# Patient Record
Sex: Female | Born: 1935
Health system: Southern US, Community
[De-identification: ages and names within clinical notes are randomized; demographics above are authoritative.]

## PROBLEM LIST (undated history)

## (undated) DIAGNOSIS — E785 Hyperlipidemia, unspecified: Secondary | ICD-10-CM

## (undated) DIAGNOSIS — R600 Localized edema: Secondary | ICD-10-CM

## (undated) DIAGNOSIS — I255 Ischemic cardiomyopathy: Secondary | ICD-10-CM

## (undated) DIAGNOSIS — I447 Left bundle-branch block, unspecified: Secondary | ICD-10-CM

## (undated) DIAGNOSIS — I10 Essential (primary) hypertension: Secondary | ICD-10-CM

## (undated) DIAGNOSIS — I4891 Unspecified atrial fibrillation: Secondary | ICD-10-CM

## (undated) DIAGNOSIS — H3530 Unspecified macular degeneration: Secondary | ICD-10-CM

## (undated) DIAGNOSIS — K7689 Other specified diseases of liver: Secondary | ICD-10-CM

## (undated) DIAGNOSIS — R42 Dizziness and giddiness: Secondary | ICD-10-CM

## (undated) DIAGNOSIS — R609 Edema, unspecified: Secondary | ICD-10-CM

## (undated) DIAGNOSIS — Z9889 Other specified postprocedural states: Secondary | ICD-10-CM

## (undated) DIAGNOSIS — K219 Gastro-esophageal reflux disease without esophagitis: Secondary | ICD-10-CM

## (undated) DIAGNOSIS — K5900 Constipation, unspecified: Secondary | ICD-10-CM

## (undated) HISTORY — DX: Left bundle-branch block, unspecified: I44.7

## (undated) HISTORY — PX: TONSILLECTOMY: SUR1361

## (undated) HISTORY — DX: Hyperlipidemia, unspecified: E78.5

## (undated) HISTORY — DX: Dizziness and giddiness: R42

## (undated) HISTORY — PX: CHOLECYSTECTOMY: SHX55

## (undated) HISTORY — PX: WRIST SURGERY: SHX841

## (undated) HISTORY — DX: Other specified diseases of liver: K76.89

## (undated) HISTORY — PX: OTHER SURGICAL HISTORY: SHX169

## (undated) HISTORY — DX: Other specified postprocedural states: Z98.89

## (undated) HISTORY — DX: Edema, unspecified: R60.9

## (undated) HISTORY — DX: Localized edema: R60.0

## (undated) HISTORY — DX: Unspecified atrial fibrillation: I48.91

## (undated) HISTORY — DX: Ischemic cardiomyopathy: I25.5

## (undated) HISTORY — DX: Essential (primary) hypertension: I10

## (undated) HISTORY — DX: Gastro-esophageal reflux disease without esophagitis: K21.9

## (undated) HISTORY — PX: ANKLE SURGERY: SHX546

---

## 1998-10-03 ENCOUNTER — Emergency Department (HOSPITAL_COMMUNITY): Admission: EM | Admit: 1998-10-03 | Discharge: 1998-10-03 | Payer: Self-pay | Admitting: Emergency Medicine

## 1999-01-29 ENCOUNTER — Other Ambulatory Visit: Admission: RE | Admit: 1999-01-29 | Discharge: 1999-01-29 | Payer: Self-pay | Admitting: *Deleted

## 1999-11-02 ENCOUNTER — Encounter: Admission: RE | Admit: 1999-11-02 | Discharge: 1999-11-02 | Payer: Self-pay | Admitting: Family Medicine

## 1999-11-02 ENCOUNTER — Encounter: Payer: Self-pay | Admitting: Family Medicine

## 2000-01-31 ENCOUNTER — Other Ambulatory Visit: Admission: RE | Admit: 2000-01-31 | Discharge: 2000-01-31 | Payer: Self-pay | Admitting: *Deleted

## 2001-06-05 ENCOUNTER — Ambulatory Visit (HOSPITAL_COMMUNITY): Admission: RE | Admit: 2001-06-05 | Discharge: 2001-06-05 | Payer: Self-pay | Admitting: Cardiovascular Disease

## 2001-10-13 ENCOUNTER — Encounter (INDEPENDENT_AMBULATORY_CARE_PROVIDER_SITE_OTHER): Payer: Self-pay | Admitting: Specialist

## 2001-10-13 ENCOUNTER — Ambulatory Visit (HOSPITAL_COMMUNITY): Admission: RE | Admit: 2001-10-13 | Discharge: 2001-10-13 | Payer: Self-pay | Admitting: Gastroenterology

## 2002-04-13 ENCOUNTER — Encounter: Payer: Self-pay | Admitting: Family Medicine

## 2002-04-13 ENCOUNTER — Encounter: Admission: RE | Admit: 2002-04-13 | Discharge: 2002-04-13 | Payer: Self-pay | Admitting: Family Medicine

## 2003-02-10 ENCOUNTER — Inpatient Hospital Stay (HOSPITAL_COMMUNITY): Admission: AD | Admit: 2003-02-10 | Discharge: 2003-02-13 | Payer: Self-pay | Admitting: Family Medicine

## 2003-02-10 ENCOUNTER — Encounter: Admission: RE | Admit: 2003-02-10 | Discharge: 2003-02-10 | Payer: Self-pay | Admitting: Family Medicine

## 2003-02-10 ENCOUNTER — Encounter: Payer: Self-pay | Admitting: Family Medicine

## 2003-02-11 ENCOUNTER — Encounter: Payer: Self-pay | Admitting: Internal Medicine

## 2003-02-11 ENCOUNTER — Encounter: Payer: Self-pay | Admitting: Cardiology

## 2003-12-02 ENCOUNTER — Encounter: Admission: RE | Admit: 2003-12-02 | Discharge: 2003-12-02 | Payer: Self-pay | Admitting: General Surgery

## 2004-09-13 ENCOUNTER — Encounter: Admission: RE | Admit: 2004-09-13 | Discharge: 2004-09-13 | Payer: Self-pay | Admitting: Family Medicine

## 2004-10-30 ENCOUNTER — Ambulatory Visit (HOSPITAL_COMMUNITY): Admission: RE | Admit: 2004-10-30 | Discharge: 2004-10-30 | Payer: Self-pay | Admitting: Gastroenterology

## 2005-03-19 ENCOUNTER — Ambulatory Visit: Payer: Self-pay | Admitting: Cardiovascular Disease

## 2005-04-18 ENCOUNTER — Ambulatory Visit: Payer: Self-pay | Admitting: Cardiovascular Disease

## 2005-04-18 ENCOUNTER — Ambulatory Visit: Payer: Self-pay

## 2005-04-22 ENCOUNTER — Ambulatory Visit: Payer: Self-pay | Admitting: Cardiology

## 2005-05-15 ENCOUNTER — Ambulatory Visit: Payer: Self-pay | Admitting: Cardiovascular Disease

## 2005-07-14 ENCOUNTER — Emergency Department (HOSPITAL_COMMUNITY): Admission: EM | Admit: 2005-07-14 | Discharge: 2005-07-14 | Payer: Self-pay | Admitting: Family Medicine

## 2005-07-29 ENCOUNTER — Ambulatory Visit: Payer: Self-pay | Admitting: Cardiovascular Disease

## 2006-01-28 ENCOUNTER — Ambulatory Visit: Payer: Self-pay | Admitting: Cardiovascular Disease

## 2006-01-28 ENCOUNTER — Ambulatory Visit: Payer: Self-pay | Admitting: *Deleted

## 2006-02-06 ENCOUNTER — Encounter (INDEPENDENT_AMBULATORY_CARE_PROVIDER_SITE_OTHER): Payer: Self-pay | Admitting: *Deleted

## 2006-02-06 ENCOUNTER — Ambulatory Visit (HOSPITAL_BASED_OUTPATIENT_CLINIC_OR_DEPARTMENT_OTHER): Admission: RE | Admit: 2006-02-06 | Discharge: 2006-02-06 | Payer: Self-pay | Admitting: General Surgery

## 2006-06-16 ENCOUNTER — Ambulatory Visit (HOSPITAL_COMMUNITY): Admission: RE | Admit: 2006-06-16 | Discharge: 2006-06-16 | Payer: Self-pay | Admitting: Ophthalmology

## 2006-07-18 ENCOUNTER — Ambulatory Visit: Payer: Self-pay | Admitting: Cardiovascular Disease

## 2006-07-31 ENCOUNTER — Encounter: Payer: Self-pay | Admitting: Cardiology

## 2006-07-31 ENCOUNTER — Ambulatory Visit: Payer: Self-pay

## 2006-08-11 ENCOUNTER — Ambulatory Visit: Payer: Self-pay | Admitting: Cardiovascular Disease

## 2006-09-09 ENCOUNTER — Emergency Department (HOSPITAL_COMMUNITY): Admission: EM | Admit: 2006-09-09 | Discharge: 2006-09-09 | Payer: Self-pay | Admitting: Emergency Medicine

## 2006-09-11 ENCOUNTER — Ambulatory Visit: Payer: Self-pay | Admitting: Cardiovascular Disease

## 2006-11-03 ENCOUNTER — Encounter: Admission: RE | Admit: 2006-11-03 | Discharge: 2006-11-03 | Payer: Self-pay | Admitting: Family Medicine

## 2007-02-18 ENCOUNTER — Ambulatory Visit: Payer: Self-pay | Admitting: Cardiovascular Disease

## 2007-05-06 ENCOUNTER — Ambulatory Visit: Payer: Self-pay | Admitting: Cardiovascular Disease

## 2007-05-06 LAB — CONVERTED CEMR LAB
Chloride: 108 meq/L (ref 96–112)
GFR calc non Af Amer: 64 mL/min
Potassium: 4.2 meq/L (ref 3.5–5.1)
Pro B Natriuretic peptide (BNP): 148 pg/mL — ABNORMAL HIGH (ref 0.0–100.0)
Sodium: 145 meq/L (ref 135–145)

## 2007-08-04 ENCOUNTER — Ambulatory Visit: Payer: Self-pay | Admitting: Cardiology

## 2007-08-04 ENCOUNTER — Observation Stay (HOSPITAL_COMMUNITY): Admission: EM | Admit: 2007-08-04 | Discharge: 2007-08-07 | Payer: Self-pay | Admitting: Emergency Medicine

## 2007-09-02 ENCOUNTER — Ambulatory Visit: Payer: Self-pay | Admitting: Cardiovascular Disease

## 2007-10-26 ENCOUNTER — Ambulatory Visit: Payer: Self-pay | Admitting: Cardiovascular Disease

## 2008-04-21 ENCOUNTER — Encounter: Admission: RE | Admit: 2008-04-21 | Discharge: 2008-04-21 | Payer: Self-pay | Admitting: Family Medicine

## 2008-04-25 ENCOUNTER — Ambulatory Visit: Payer: Self-pay | Admitting: Cardiovascular Disease

## 2008-06-07 ENCOUNTER — Emergency Department (HOSPITAL_COMMUNITY): Admission: EM | Admit: 2008-06-07 | Discharge: 2008-06-07 | Payer: Self-pay | Admitting: Emergency Medicine

## 2008-07-26 ENCOUNTER — Ambulatory Visit: Payer: Self-pay

## 2008-07-26 ENCOUNTER — Encounter: Payer: Self-pay | Admitting: Cardiovascular Disease

## 2008-07-28 ENCOUNTER — Ambulatory Visit: Payer: Self-pay | Admitting: Cardiovascular Disease

## 2008-07-28 ENCOUNTER — Observation Stay (HOSPITAL_COMMUNITY): Admission: AD | Admit: 2008-07-28 | Discharge: 2008-07-30 | Payer: Self-pay | Admitting: Cardiovascular Disease

## 2008-08-10 ENCOUNTER — Inpatient Hospital Stay (HOSPITAL_COMMUNITY): Admission: EM | Admit: 2008-08-10 | Discharge: 2008-08-14 | Payer: Self-pay | Admitting: Emergency Medicine

## 2008-08-12 ENCOUNTER — Encounter (INDEPENDENT_AMBULATORY_CARE_PROVIDER_SITE_OTHER): Payer: Self-pay | Admitting: Internal Medicine

## 2008-08-12 ENCOUNTER — Ambulatory Visit: Payer: Self-pay | Admitting: Surgery

## 2008-08-15 ENCOUNTER — Ambulatory Visit: Payer: Self-pay | Admitting: Cardiovascular Disease

## 2008-08-27 ENCOUNTER — Ambulatory Visit: Payer: Self-pay | Admitting: Internal Medicine

## 2008-08-27 ENCOUNTER — Inpatient Hospital Stay (HOSPITAL_COMMUNITY): Admission: EM | Admit: 2008-08-27 | Discharge: 2008-09-03 | Payer: Self-pay | Admitting: Emergency Medicine

## 2008-10-15 ENCOUNTER — Inpatient Hospital Stay (HOSPITAL_COMMUNITY): Admission: EM | Admit: 2008-10-15 | Discharge: 2008-10-17 | Payer: Self-pay | Admitting: Emergency Medicine

## 2008-11-08 DIAGNOSIS — K7689 Other specified diseases of liver: Secondary | ICD-10-CM | POA: Insufficient documentation

## 2008-11-08 DIAGNOSIS — K8020 Calculus of gallbladder without cholecystitis without obstruction: Secondary | ICD-10-CM | POA: Insufficient documentation

## 2008-11-08 DIAGNOSIS — I447 Left bundle-branch block, unspecified: Secondary | ICD-10-CM

## 2008-11-08 DIAGNOSIS — I10 Essential (primary) hypertension: Secondary | ICD-10-CM

## 2008-11-08 DIAGNOSIS — Z9889 Other specified postprocedural states: Secondary | ICD-10-CM | POA: Insufficient documentation

## 2008-11-08 DIAGNOSIS — E785 Hyperlipidemia, unspecified: Secondary | ICD-10-CM | POA: Insufficient documentation

## 2008-11-08 DIAGNOSIS — I4891 Unspecified atrial fibrillation: Secondary | ICD-10-CM | POA: Insufficient documentation

## 2008-11-08 DIAGNOSIS — E1140 Type 2 diabetes mellitus with diabetic neuropathy, unspecified: Secondary | ICD-10-CM

## 2008-11-08 DIAGNOSIS — K219 Gastro-esophageal reflux disease without esophagitis: Secondary | ICD-10-CM

## 2008-11-25 ENCOUNTER — Inpatient Hospital Stay (HOSPITAL_COMMUNITY): Admission: EM | Admit: 2008-11-25 | Discharge: 2008-11-28 | Payer: Self-pay | Admitting: Emergency Medicine

## 2008-12-01 ENCOUNTER — Ambulatory Visit: Payer: Self-pay | Admitting: Cardiovascular Disease

## 2009-01-13 ENCOUNTER — Ambulatory Visit: Payer: Self-pay | Admitting: Cardiology

## 2009-01-13 ENCOUNTER — Encounter (INDEPENDENT_AMBULATORY_CARE_PROVIDER_SITE_OTHER): Payer: Self-pay | Admitting: Surgery

## 2009-01-13 ENCOUNTER — Ambulatory Visit (HOSPITAL_COMMUNITY): Admission: RE | Admit: 2009-01-13 | Discharge: 2009-01-15 | Payer: Self-pay | Admitting: Surgery

## 2009-02-08 ENCOUNTER — Ambulatory Visit: Payer: Self-pay | Admitting: Cardiovascular Disease

## 2009-02-08 ENCOUNTER — Encounter: Payer: Self-pay | Admitting: Cardiovascular Disease

## 2009-02-08 DIAGNOSIS — F329 Major depressive disorder, single episode, unspecified: Secondary | ICD-10-CM

## 2009-02-08 DIAGNOSIS — F3289 Other specified depressive episodes: Secondary | ICD-10-CM | POA: Insufficient documentation

## 2009-03-15 ENCOUNTER — Telehealth (INDEPENDENT_AMBULATORY_CARE_PROVIDER_SITE_OTHER): Payer: Self-pay | Admitting: *Deleted

## 2009-03-17 ENCOUNTER — Inpatient Hospital Stay (HOSPITAL_COMMUNITY): Admission: EM | Admit: 2009-03-17 | Discharge: 2009-03-21 | Payer: Self-pay | Admitting: Family Medicine

## 2009-03-21 ENCOUNTER — Encounter: Payer: Self-pay | Admitting: Cardiovascular Disease

## 2009-03-24 ENCOUNTER — Encounter: Payer: Self-pay | Admitting: Cardiovascular Disease

## 2009-04-01 ENCOUNTER — Encounter: Payer: Self-pay | Admitting: Cardiovascular Disease

## 2009-06-19 IMAGING — US US ABDOMEN COMPLETE
1 series · 13 of 25 positions shown · non-contrast
Comparison: CT abdomen pelvis 08/11/2008.

CLINICAL DATA: 85-year-old female with abdomen and chest pain.

ABDOMEN ULTRASOUND
TECHNIQUE: Complete abdominal ultrasound examination was performed
including evaluation of the liver, gallbladder, bile ducts,
pancreas, kidneys, spleen, IVC, and abdominal aorta.

[Series 1: unknown · 0.38mm/px · 13 of 80 slices shown]
[im 1/80]
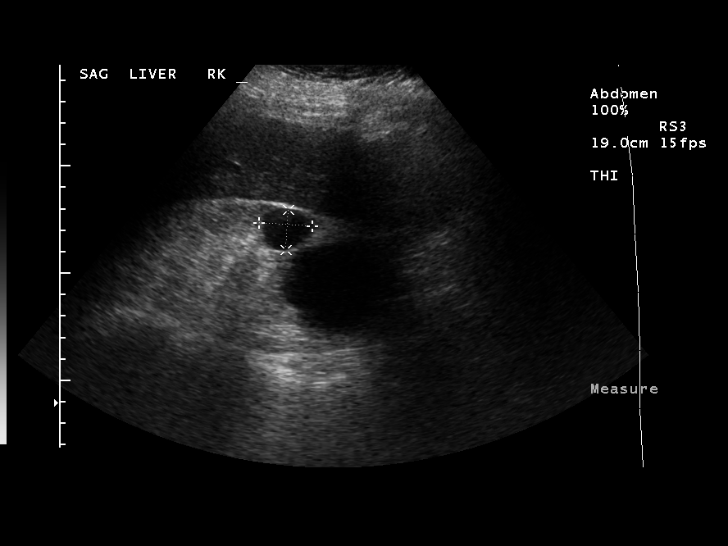
[im 7/80]
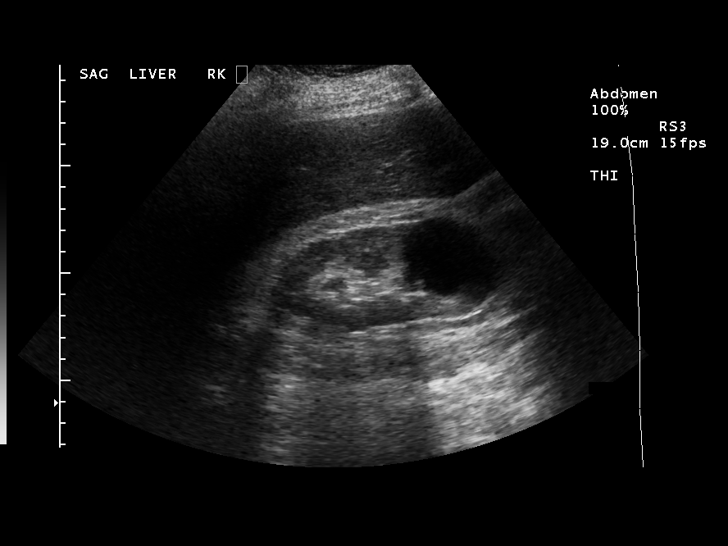
[im 14/80]
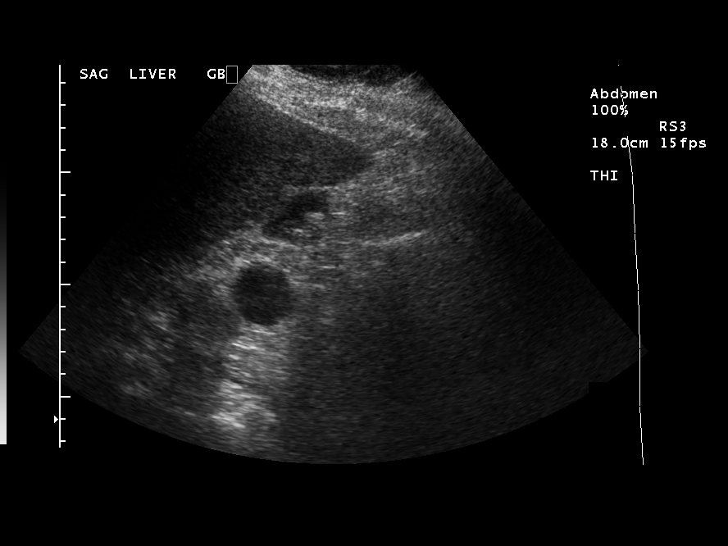
[im 20/80]
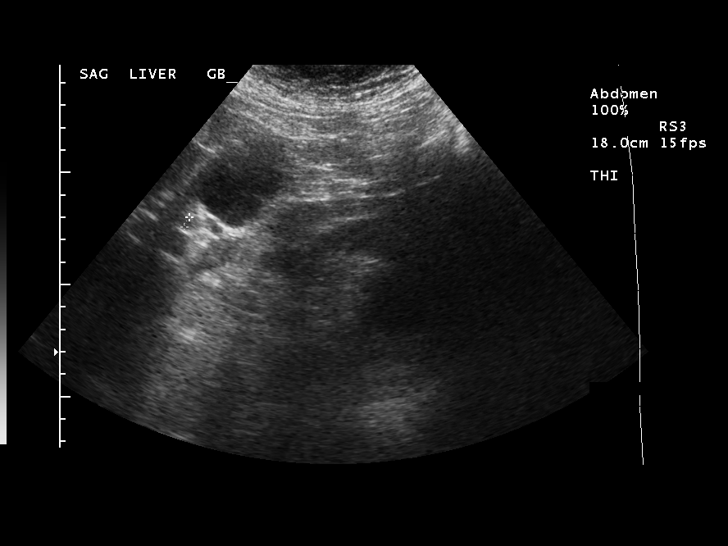
[im 27/80]
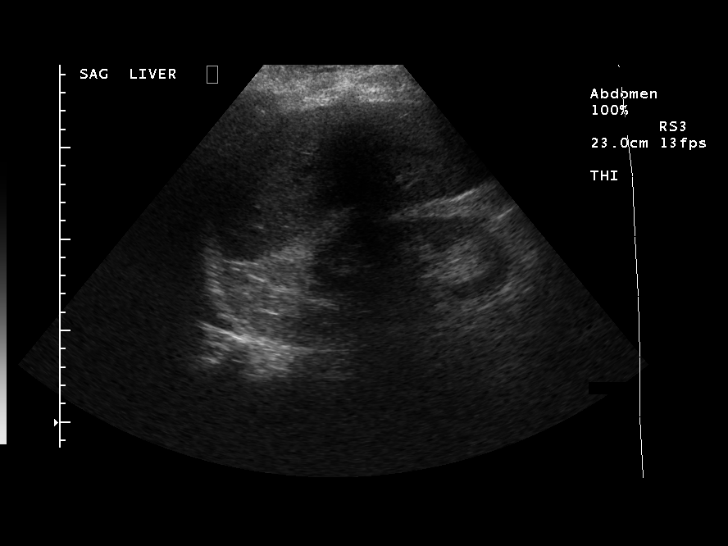
[im 33/80]
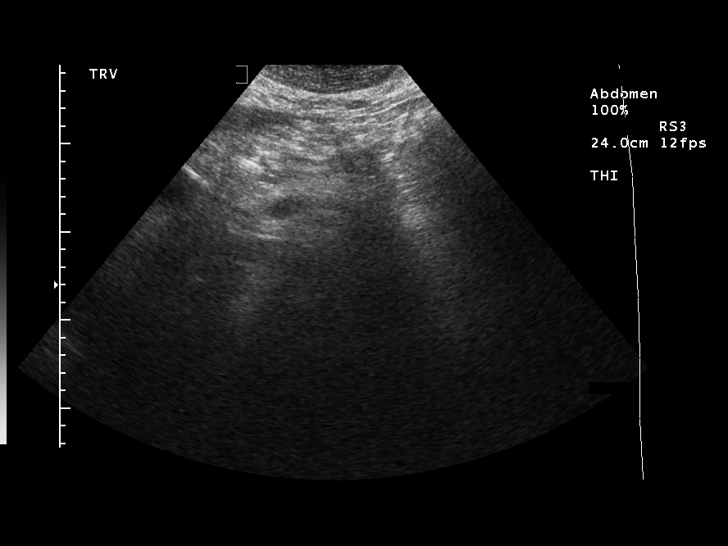
[im 40/80]
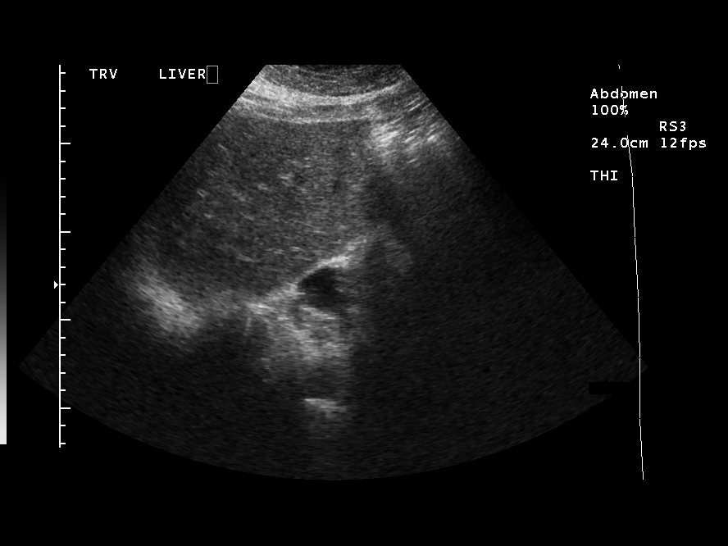
[im 47/80]
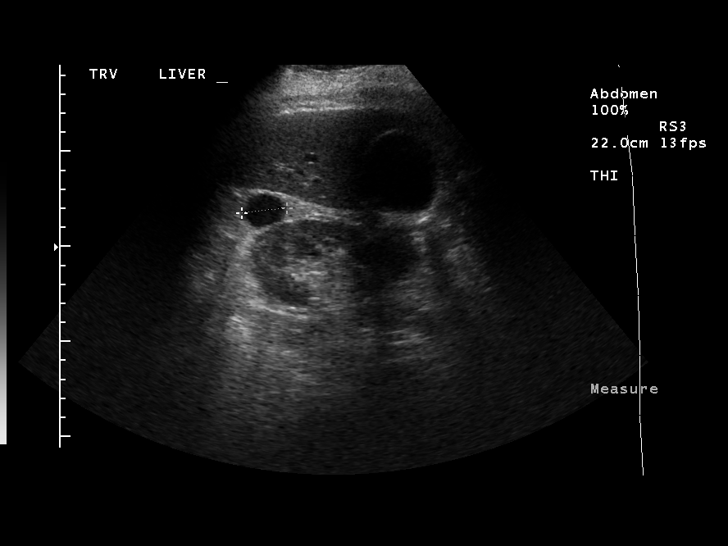
[im 53/80]
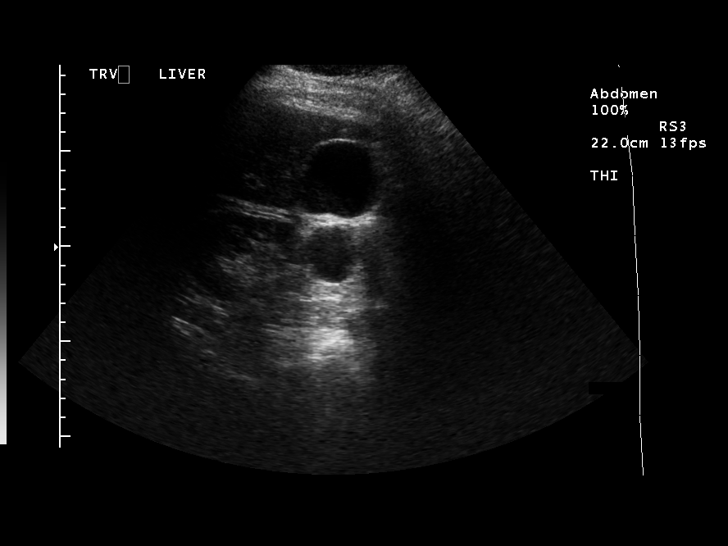
[im 60/80]
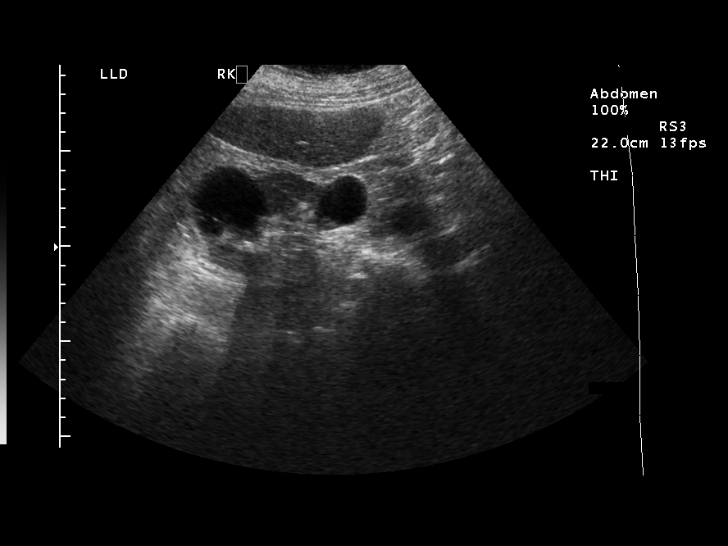
[im 66/80]
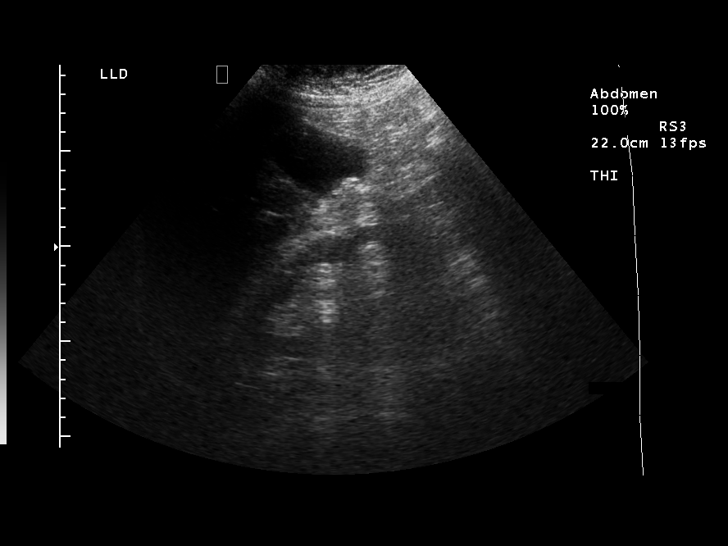
[im 73/80]
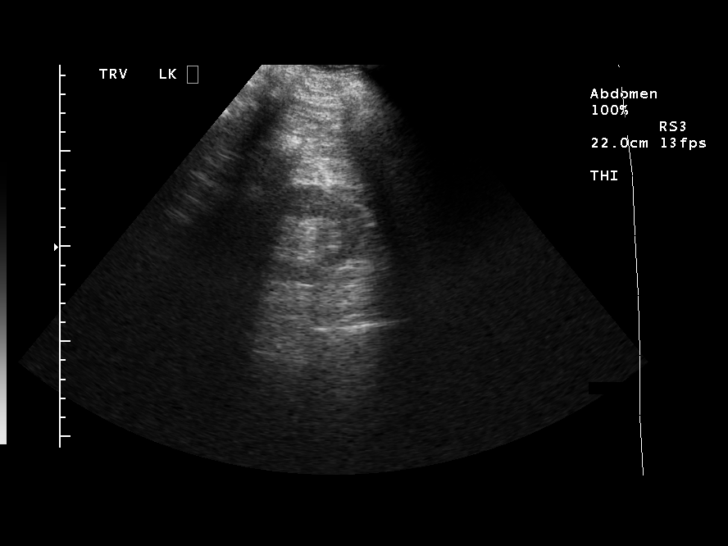
[im 80/80]
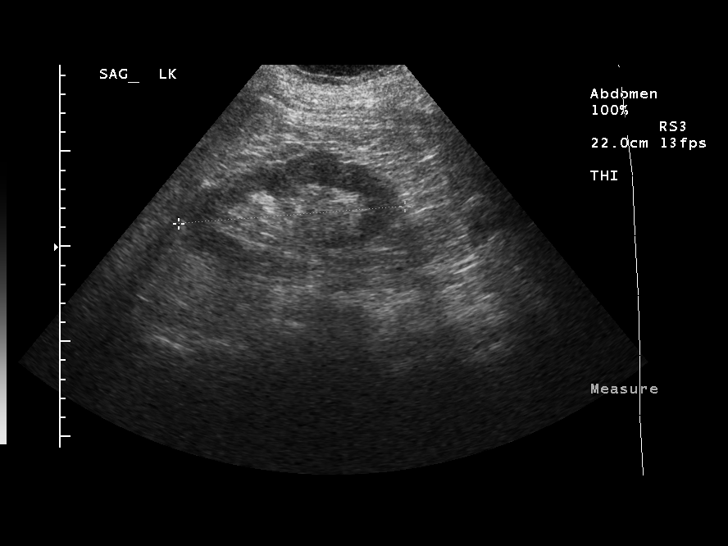

[13 of 25 positions shown; findings below may reference images not displayed]

FINDINGS: The gallbladder lumen contains dependent sludge and
echogenic stones measuring 7 - 9 mm in size.  The gallbladder wall
at the upper limits of normal measuring 2.6 mm.  No sonographic
Murphy's sign was elicited during scanning.  No pericholecystic
fluid.  Small bilateral pleural effusions.  The common bile duct is
within normal limits at 5 mm.

Several anechoic liver lesions are demonstrated without associated
vascularity measuring up to 11 x 10 x 14 mm in compatible with
cyst.  The visualized inferior vena cava and abdominal aorta are
within normal limits; maximal aortic diameter 2.7 cm.  The pancreas
is incompletely visualized due to overlying bowel gas, visualized
portions are within normal limits.  The spleen is normal measuring
involve 4 cm.  Multiple right renal simple cysts are identified,
largest measures 5.4 x 4.4 x 5.1 cm.  The kidneys are not
obstructed measuring 11.8 cm and 12 cm on the right and left
respectively.  The left kidney is normal.
IMPRESSION: 1.  Cholelithiasis and gallbladder sludge.  Wall thickness is at
the upper limits of normal.  No sonographic Murphy's sign.
Clinical correlation for acute cholecystitis recommended.
2.  Benign appearing hepatic and renal cysts.
3.  Small pleural effusions.

## 2009-08-01 ENCOUNTER — Encounter: Admission: RE | Admit: 2009-08-01 | Discharge: 2009-08-01 | Payer: Self-pay | Admitting: Family Medicine

## 2009-08-02 ENCOUNTER — Telehealth: Payer: Self-pay | Admitting: Cardiovascular Disease

## 2009-08-02 ENCOUNTER — Ambulatory Visit: Payer: Self-pay | Admitting: Cardiovascular Disease

## 2009-08-04 ENCOUNTER — Encounter: Admission: RE | Admit: 2009-08-04 | Discharge: 2009-08-04 | Payer: Self-pay | Admitting: Family Medicine

## 2009-09-01 ENCOUNTER — Ambulatory Visit: Payer: Self-pay | Admitting: Internal Medicine

## 2009-09-01 ENCOUNTER — Inpatient Hospital Stay (HOSPITAL_COMMUNITY): Admission: EM | Admit: 2009-09-01 | Discharge: 2009-09-05 | Payer: Self-pay | Admitting: Emergency Medicine

## 2009-09-04 ENCOUNTER — Ambulatory Visit: Payer: Self-pay | Admitting: Physical Medicine & Rehabilitation

## 2009-09-18 ENCOUNTER — Encounter: Payer: Self-pay | Admitting: Cardiovascular Disease

## 2009-10-18 ENCOUNTER — Ambulatory Visit: Payer: Self-pay | Admitting: Cardiovascular Disease

## 2009-12-05 ENCOUNTER — Ambulatory Visit: Payer: Self-pay | Admitting: Cardiovascular Disease

## 2010-01-08 ENCOUNTER — Encounter: Payer: Self-pay | Admitting: Cardiovascular Disease

## 2010-01-26 ENCOUNTER — Ambulatory Visit: Payer: Self-pay | Admitting: Cardiovascular Disease

## 2010-02-16 ENCOUNTER — Telehealth: Payer: Self-pay | Admitting: Cardiovascular Disease

## 2010-04-19 ENCOUNTER — Telehealth: Payer: Self-pay | Admitting: Cardiovascular Disease

## 2010-07-12 ENCOUNTER — Encounter: Payer: Self-pay | Admitting: Cardiovascular Disease

## 2010-07-31 ENCOUNTER — Ambulatory Visit: Payer: Self-pay | Admitting: Cardiovascular Disease

## 2010-07-31 DIAGNOSIS — R0602 Shortness of breath: Secondary | ICD-10-CM

## 2010-08-10 LAB — CONVERTED CEMR LAB
Basophils Absolute: 0 10*3/uL (ref 0.0–0.1)
Lymphocytes Relative: 19.2 % (ref 12.0–46.0)
Monocytes Relative: 10.6 % (ref 3.0–12.0)
Platelets: 177 10*3/uL (ref 150.0–400.0)
Pro B Natriuretic peptide (BNP): 63.2 pg/mL (ref 0.0–100.0)
RDW: 13.4 % (ref 11.5–14.6)

## 2010-08-17 ENCOUNTER — Ambulatory Visit: Payer: Self-pay

## 2010-08-17 ENCOUNTER — Encounter: Payer: Self-pay | Admitting: Cardiovascular Disease

## 2010-08-17 ENCOUNTER — Ambulatory Visit: Payer: Self-pay | Admitting: Internal Medicine

## 2010-08-17 ENCOUNTER — Ambulatory Visit (HOSPITAL_COMMUNITY): Admission: RE | Admit: 2010-08-17 | Discharge: 2010-08-17 | Payer: Self-pay | Admitting: Cardiovascular Disease

## 2010-12-20 NOTE — Assessment & Plan Note (Signed)
Summary: F2M/DM  Medications Added SIMVASTATIN 40 MG TABS (SIMVASTATIN) Take one tablet by mouth daily at bedtime AMOXICILLIN 500 MG CAPS (AMOXICILLIN) 4 tabs by mouth once daily      Allergies Added:   History of Present Illness: [name] is seen today for F/U of PAF, LBBB, DCM with EF 20% no CAD by cath in 2009 S/P recent right hip surgery from fall.  She has had some continued right hip pain and is taking hydrocodone for it.  Her BP has been better on home readings.  Average systolics have been 140-150  mmHg after increasing her Benzipril to 40mg    she has not had any SSCP, palpitations, or syncope.  She has vertiginous symptoms with change in position  She also has stable mild LE edema.    Current Problems (verified): 1)  Depressive Disorder Not Elsewhere Classified  (ICD-311) 2)  Cholelithiasis  (ICD-574.20) 3)  Dilation and Curettage, Hx of  (ICD-V45.89) 4)  Atrial Fibrillation  (ICD-427.31) 5)  Hepatic Cyst  (ICD-573.8) 6)  Gerd  (ICD-530.81) 7)  COPD  (ICD-496) 8)  Left Bundle Branch Block  (ICD-426.3) 9)  Diabetes Mellitus  (ICD-250.00) 10)  Hypertension, Benign  (ICD-401.1) 11)  Hyperlipidemia-mixed  (ICD-272.4) 12)  Cardiomyopathy,nonischemic  (ICD-425.4)  Current Medications (verified): 1)  Simvastatin 40 Mg Tabs (Simvastatin) .... Take One Tablet By Mouth Daily At Bedtime 2)  Prilosec 20 Mg Cpdr (Omeprazole) .Marland Kitchen.. 1 Tab By Mouth Once Daily 3)  Furosemide 40 Mg Tabs (Furosemide) .Marland Kitchen.. 1 Tab By Mouth Once Daily 4)  Potassium Chloride Crys Cr 20 Meq Cr-Tabs (Potassium Chloride Crys Cr) .Marland Kitchen.. 1 Tab By Mouth Once Daily 5)  Fish Oil   Oil (Fish Oil) .Marland Kitchen.. 1 Tab By Mouth Once Daily 6)  Vitamin D3 2000 Unit Caps (Cholecalciferol) .Marland Kitchen.. 1 Tab By Mouth Once Daily 7)  Aspirin 81 Mg  Tabs (Aspirin) .Marland Kitchen.. 1 Tab By Mouth Once Daily 8)  Miralax  Powd (Polyethylene Glycol 3350) .... As Needed 9)  Carvedilol 25 Mg Tabs (Carvedilol) .... Take One Tablet By Mouth Twice A Day 10)  Benazepril  Hcl 40 Mg Tabs (Benazepril Hcl) .... One Tablet Once Daily 11)  Sertraline Hcl 50 Mg Tabs (Sertraline Hcl) .Marland Kitchen.. 1 Tab By Mouth Once Daily 12)  Sm Calcium Antacid Ultra St 1000 Mg Chew (Calcium Carbonate Antacid) .Marland Kitchen.. 1 Tab By Mouth Once Daily 13)  Hydrocodone-Acetaminophen 5-325 Mg Tabs (Hydrocodone-Acetaminophen) .... As Needed 14)  Hydralazine Hcl 25 Mg Tabs (Hydralazine Hcl) .... Take One Tablet By Mouth Three Times A Day 15)  Amoxicillin 500 Mg Caps (Amoxicillin) .... 4 Tabs By Mouth Once Daily  Allergies (verified): 1)  ! Pcn 2)  ! Sulfa 3)  ! * Tetanus  Past History:  Past Medical History: Last updated: 11/08/2008 CHOLELITHIASIS (ICD-574.20) DILATION AND CURETTAGE, HX OF (ICD-V45.89) ATRIAL FIBRILLATION (ICD-427.31) HEPATIC CYST (ICD-573.8) GERD (ICD-530.81) COPD (ICD-496) LEFT BUNDLE BRANCH BLOCK (ICD-426.3) DIABETES MELLITUS (ICD-250.00) HYPERTENSION, BENIGN (ICD-401.1) HYPERLIPIDEMIA-MIXED (ICD-272.4) CARDIOMYOPATHY,NONISCHEMIC (ICD-425.4) Hx, of Dizziness with some gait disability Hx, of peripheral edema  Past Surgical History: Last updated: 11/08/2008 hx of wrist surgery DILATION AND CURETTAGE, HX OF (ICD-V45.89)  Family History: Last updated: 11/08/2008 non-contributory  Social History: Last updated: 08/02/2009 Retired  Widowed  Non-smoker Non-drinker  Review of Systems       Denies fever, malais, weight loss, blurry vision, decreased visual acuity, cough, sputum, SOB, hemoptysis, pleuritic pain, palpitaitons, heartburn, abdominal pain, melena, , claudication, or rash.   Vital Signs:  Patient profile:  77 year old female Height:      70 inches Weight:      196 pounds Pulse rate:   70 / minute Resp:     12 per minute BP sitting:   140 / 70  (left arm)  Vitals Entered By: Kem Parkinson (January 26, 2010 10:43 AM)  Physical Exam  General:  Affect appropriate Healthy:  appears stated age HEENT: normal Neck supple with no adenopathy JVP  normal no bruits no thyromegaly Lungs clear with no wheezing and good diaphragmatic motion Heart:  S1/S2 no murmur,rub, gallop or click PMI normal Abdomen: benighn, BS positve, no tenderness, no AAA no bruit.  No HSM or HJR Distal pulses intact with no bruits Mild LE  edema Neuro non-focal Skin warm and dry    Impression & Recommendations:  Problem # 1:  ATRIAL FIBRILLATION (ICD-427.31) Paroxysmal in NSR continue ASA and BB Her updated medication list for this problem includes:    Aspirin 81 Mg Tabs (Aspirin) .Marland Kitchen... 1 tab by mouth once daily    Carvedilol 25 Mg Tabs (Carvedilol) .Marland Kitchen... Take one tablet by mouth twice a day  Problem # 2:  LEFT BUNDLE BRANCH BLOCK (ICD-426.3) No syncope.  seconday to DCM.  Continue to follow for hight grade heart block Her updated medication list for this problem includes:    Aspirin 81 Mg Tabs (Aspirin) .Marland Kitchen... 1 tab by mouth once daily    Carvedilol 25 Mg Tabs (Carvedilol) .Marland Kitchen... Take one tablet by mouth twice a day    Benazepril Hcl 40 Mg Tabs (Benazepril hcl) ..... One tablet once daily  Problem # 3:  HYPERTENSION, BENIGN (ICD-401.1) Well contorlled.  Low soidum diet Her updated medication list for this problem includes:    Furosemide 40 Mg Tabs (Furosemide) .Marland Kitchen... 1 tab by mouth once daily    Aspirin 81 Mg Tabs (Aspirin) .Marland Kitchen... 1 tab by mouth once daily    Carvedilol 25 Mg Tabs (Carvedilol) .Marland Kitchen... Take one tablet by mouth twice a day    Benazepril Hcl 40 Mg Tabs (Benazepril hcl) ..... One tablet once daily    Hydralazine Hcl 25 Mg Tabs (Hydralazine hcl) .Marland Kitchen... Take one tablet by mouth three times a day  Problem # 4:  CARDIOMYOPATHY,NONISCHEMIC (ICD-425.4) Euvolemic with mild LE edema from venous insuf.  Continue current dose of diuretic.  Activity limited more by recent hip fracture and orthopedic knee injuries Her updated medication list for this problem includes:    Furosemide 40 Mg Tabs (Furosemide) .Marland Kitchen... 1 tab by mouth once daily    Aspirin 81  Mg Tabs (Aspirin) .Marland Kitchen... 1 tab by mouth once daily    Carvedilol 25 Mg Tabs (Carvedilol) .Marland Kitchen... Take one tablet by mouth twice a day    Benazepril Hcl 40 Mg Tabs (Benazepril hcl) ..... One tablet once daily  Patient Instructions: 1)  Your physician recommends that you schedule a follow-up appointment in: 6 MONTHS 2)  Your physician has recommended you make the following change in your medication: START SIMVASTATIN 40MG  ONCE DAILY Prescriptions: SIMVASTATIN 40 MG TABS (SIMVASTATIN) Take one tablet by mouth daily at bedtime  #90 x 4   Entered by:   Deliah Goody, RN   Authorized by:   Colon Branch, MD, [facility]   Signed by:   Deliah Goody, RN on 01/26/2010   Method used:   Print then Give to Patient   RxID:   [id number]

## 2010-12-20 NOTE — Progress Notes (Signed)
Summary: Patient's at Home Vitals  Patient's at Home Vitals   Imported By: Marylou Mccoy 01/09/2010 11:42:22  _____________________________________________________________________  External Attachment:    Type:   Image     Comment:   External Document

## 2010-12-20 NOTE — Assessment & Plan Note (Signed)
Summary: PER CHECK OUT/SAF  Medications Added FUROSEMIDE 40 MG TABS (FUROSEMIDE) 1 tab by mouth once daily CARVEDILOL 25 MG TABS (CARVEDILOL) Take one tablet by mouth twice a day BENAZEPRIL HCL 40 MG TABS (BENAZEPRIL HCL) one tablet once daily SERTRALINE HCL 50 MG TABS (SERTRALINE HCL) 1 tab by mouth once daily SM CALCIUM ANTACID ULTRA ST 1000 MG CHEW (CALCIUM CARBONATE ANTACID) 1 tab by mouth once daily HYDROCODONE-ACETAMINOPHEN 5-325 MG TABS (HYDROCODONE-ACETAMINOPHEN) as needed      Allergies Added:   CC:  dizziness.  History of Present Illness: [name] is seen today for F/U of PAF, LBBB, DCM with EF 20% no CAD by cath in 2009 S/P recent right hip surgery from fall.  She has had some continued right hip pain and is taking hydrocodone for it.  Her BP has been elevated on home readings.  Average systolics have been 170-180 mmHg.  We will increase her Benzipril to 40mg .  she has not had any SSCP, palpitations, or syncope.  She has vertiginous symptoms with change in position  She also has stable mild LE edema.    Current Problems (verified): 1)  Depressive Disorder Not Elsewhere Classified  (ICD-311) 2)  Cholelithiasis  (ICD-574.20) 3)  Dilation and Curettage, Hx of  (ICD-V45.89) 4)  Atrial Fibrillation  (ICD-427.31) 5)  Hepatic Cyst  (ICD-573.8) 6)  Gerd  (ICD-530.81) 7)  COPD  (ICD-496) 8)  Left Bundle Branch Block  (ICD-426.3) 9)  Diabetes Mellitus  (ICD-250.00) 10)  Hypertension, Benign  (ICD-401.1) 11)  Hyperlipidemia-mixed  (ICD-272.4) 12)  Cardiomyopathy,nonischemic  (ICD-425.4)  Current Medications (verified): 1)  Lipitor 20 Mg Tabs (Atorvastatin Calcium) .Marland Kitchen.. 1 Tab By Mouth Once Daily 2)  Prilosec 20 Mg Cpdr (Omeprazole) .Marland Kitchen.. 1 Tab By Mouth Once Daily 3)  Furosemide 40 Mg Tabs (Furosemide) .Marland Kitchen.. 1 Tab By Mouth Once Daily 4)  Potassium Chloride Crys Cr 20 Meq Cr-Tabs (Potassium Chloride Crys Cr) .Marland Kitchen.. 1 Tab By Mouth Once Daily 5)  Fish Oil   Oil (Fish Oil) .Marland Kitchen.. 1 Tab By  Mouth Once Daily 6)  Vitamin D3 2000 Unit Caps (Cholecalciferol) .Marland Kitchen.. 1 Tab By Mouth Once Daily 7)  Aspirin 81 Mg  Tabs (Aspirin) .Marland Kitchen.. 1 Tab By Mouth Once Daily 8)  Miralax  Powd (Polyethylene Glycol 3350) .... As Needed 9)  Carvedilol 25 Mg Tabs (Carvedilol) .... Take One Tablet By Mouth Twice A Day 10)  Benazepril Hcl 20 Mg Tabs (Benazepril Hcl) .Marland Kitchen.. 1 Tab By Mouth Once Daily 11)  Sertraline Hcl 50 Mg Tabs (Sertraline Hcl) .Marland Kitchen.. 1 Tab By Mouth Once Daily 12)  Sm Calcium Antacid Ultra St 1000 Mg Chew (Calcium Carbonate Antacid) .Marland Kitchen.. 1 Tab By Mouth Once Daily 13)  Hydrocodone-Acetaminophen 5-325 Mg Tabs (Hydrocodone-Acetaminophen) .... As Needed  Allergies (verified): 1)  ! Pcn 2)  ! Sulfa 3)  ! * Tetanus  Past History:  Past Medical History: Last updated: 11/08/2008 CHOLELITHIASIS (ICD-574.20) DILATION AND CURETTAGE, HX OF (ICD-V45.89) ATRIAL FIBRILLATION (ICD-427.31) HEPATIC CYST (ICD-573.8) GERD (ICD-530.81) COPD (ICD-496) LEFT BUNDLE BRANCH BLOCK (ICD-426.3) DIABETES MELLITUS (ICD-250.00) HYPERTENSION, BENIGN (ICD-401.1) HYPERLIPIDEMIA-MIXED (ICD-272.4) CARDIOMYOPATHY,NONISCHEMIC (ICD-425.4) Hx, of Dizziness with some gait disability Hx, of peripheral edema  Past Surgical History: Last updated: 11/08/2008 hx of wrist surgery DILATION AND CURETTAGE, HX OF (ICD-V45.89)  Family History: Last updated: 11/08/2008 non-contributory  Social History: Last updated: 08/02/2009 Retired  Widowed  Non-smoker Non-drinker  Review of Systems       Denies fever, malais, weight loss, blurry vision, decreased visual acuity, cough, sputum,  SOB, hemoptysis, pleuritic pain, palpitaitons, heartburn, abdominal pain, melena, , claudication, or rash. All other systmes reviewed and negative except as noted in HPI  Vital Signs:  Patient profile:   77 year old female Height:      70 inches Weight:      184 pounds Pulse rate:   69 / minute Pulse rhythm:   regular Resp:     12 per  minute BP sitting:   170 / 81  (left arm)  Vitals Entered By: Kem Parkinson (December 05, 2009 1:38 PM)  Physical Exam  General:  Affect appropriate Healthy:  appears stated age HEENT: normal Neck supple with no adenopathy JVP normal no bruits no thyromegaly Lungs clear with no wheezing and good diaphragmatic motion Heart:  S1/S2 no murmur,rub, gallop or click PMI normal Abdomen: benighn, BS positve, no tenderness, no AAA no bruit.  No HSM or HJR Distal pulses intact with no bruits No edema Neuro non-focal Skin warm and dry    Impression & Recommendations:  Problem # 1:  ATRIAL FIBRILLATION (ICD-427.31)  Maint NSR with no palpitaitons Her updated medication list for this problem includes:    Aspirin 81 Mg Tabs (Aspirin) .Marland Kitchen... 1 tab by mouth once daily    Carvedilol 25 Mg Tabs (Carvedilol) .Marland Kitchen... Take one tablet by mouth twice a day  Her updated medication list for this problem includes:    Aspirin 81 Mg Tabs (Aspirin) .Marland Kitchen... 1 tab by mouth once daily    Carvedilol 25 Mg Tabs (Carvedilol) .Marland Kitchen... Take one tablet by mouth twice a day  Problem # 2:  HYPERTENSION, BENIGN (ICD-401.1)  Increase benzipril and f/U with me in 8 weeks The following medications were removed from the medication list:    Diltiazem Hcl Er Beads 120 Mg Xr24h-cap (Diltiazem hcl er beads) .Marland Kitchen... Take one capsule by mouth daily Her updated medication list for this problem includes:    Furosemide 40 Mg Tabs (Furosemide) .Marland Kitchen... 1 tab by mouth once daily    Aspirin 81 Mg Tabs (Aspirin) .Marland Kitchen... 1 tab by mouth once daily    Carvedilol 25 Mg Tabs (Carvedilol) .Marland Kitchen... Take one tablet by mouth twice a day    Benazepril Hcl 20 Mg Tabs (Benazepril hcl) .Marland Kitchen... 1 tab by mouth once daily  The following medications were removed from the medication list:    Diltiazem Hcl Er Beads 120 Mg Xr24h-cap (Diltiazem hcl er beads) .Marland Kitchen... Take one capsule by mouth daily Her updated medication list for this problem includes:     Furosemide 40 Mg Tabs (Furosemide) .Marland Kitchen... 1 tab by mouth once daily    Aspirin 81 Mg Tabs (Aspirin) .Marland Kitchen... 1 tab by mouth once daily    Carvedilol 25 Mg Tabs (Carvedilol) .Marland Kitchen... Take one tablet by mouth twice a day    Benazepril Hcl 40 Mg Tabs (Benazepril hcl) ..... One tablet once daily  Problem # 3:  CARDIOMYOPATHY,NONISCHEMIC (ICD-425.4) Stable with reasonable volume continue daily Lasix The following medications were removed from the medication list:    Diltiazem Hcl Er Beads 120 Mg Xr24h-cap (Diltiazem hcl er beads) .Marland Kitchen... Take one capsule by mouth daily Her updated medication list for this problem includes:    Furosemide 40 Mg Tabs (Furosemide) .Marland Kitchen... 1 tab by mouth once daily    Aspirin 81 Mg Tabs (Aspirin) .Marland Kitchen... 1 tab by mouth once daily    Carvedilol 25 Mg Tabs (Carvedilol) .Marland Kitchen... Take one tablet by mouth twice a day    Benazepril Hcl 40 Mg Tabs (Benazepril hcl) .Marland KitchenMarland KitchenMarland KitchenMarland Kitchen  One tablet once daily  Problem # 4:  HYPERLIPIDEMIA-MIXED (ICD-272.4)  Lipid and and liver in 6 months.   Her updated medication list for this problem includes:    Lipitor 20 Mg Tabs (Atorvastatin calcium) .Marland Kitchen... 1 tab by mouth once daily  Her updated medication list for this problem includes:    Lipitor 20 Mg Tabs (Atorvastatin calcium) .Marland Kitchen... 1 tab by mouth once daily  Patient Instructions: 1)  Your physician recommends that you schedule a follow-up appointment in: 8 WEEKS 2)  Your physician has recommended you make the following change in your medication: INCREASE BENAZEPRIL 40MG  ONE TABLET ONCE DAILY Prescriptions: BENAZEPRIL HCL 40 MG TABS (BENAZEPRIL HCL) one tablet once daily  #90 x 4   Entered by:   Deliah Goody, RN   Authorized by:   Colon Branch, MD, [facility]   Signed by:   Deliah Goody, RN on 12/05/2009   Method used:   Faxed to ...       [facility] (mail-order)       [address]       [place], [place]  [id number]       Ph: [phone]       Fax: [phone]   RxID:    [id number]

## 2010-12-20 NOTE — Progress Notes (Signed)
Summary: pt has new Pharm   Phone Note Refill Request Call back at Home Phone [phone] Message from:  Patient on [facility]  Refills Requested: Medication #1:  CARVEDILOL 25 MG TABS Take one tablet by mouth twice a day  Medication #2:  POTASSIUM CHLORIDE CRYS CR 20 MEQ CR-TABS 1 tab by mouth once daily pt has changed Pharm  Initial call taken by: Omer Jack,  April 19, 2010 12:16 PM    Prescriptions: CARVEDILOL 25 MG TABS (CARVEDILOL) Take one tablet by mouth twice a day  #180 x 3   Entered by:   Kem Parkinson   Authorized by:   Colon Branch, MD, [facility]   Signed by:   Kem Parkinson on 04/19/2010   Method used:   Faxed to ...       [facility] (mail-order)             , [place]         Ph: [phone]       Fax: [phone]   RxID:   [id number] POTASSIUM CHLORIDE CRYS CR 20 MEQ CR-TABS (POTASSIUM CHLORIDE CRYS CR) 1 tab by mouth once daily  #90 x 3   Entered by:   Kem Parkinson   Authorized by:   Colon Branch, MD, [facility]   Signed by:   Kem Parkinson on 04/19/2010   Method used:   Faxed to ...       [facility] (mail-order)             , [place]         Ph: [phone]       Fax: [phone]   RxID:   [id number]

## 2010-12-20 NOTE — Progress Notes (Signed)
Summary: elevated HR   Phone Note Call from Patient Call back at Home Phone [phone]   Caller: pt Reason for Call: Talk to Nurse Summary of Call: pt HR has been up wants to know what she can take for it. Initial call taken by: Faythe Ghee,  February 16, 2010 11:13 AM  Follow-up for Phone Call        spoke with pt, pt has been sick and vomiting. her heart rate is running high and her bp is low. she has not been drinking alot of fluids and has only drank coffee. explained to pt she is getting dehydrated. she will need to increase her water and juice intake. she will avoid caffine. she will call back if cont to have problems. Deliah Goody, RN  February 16, 2010 12:22 PM\par

## 2010-12-20 NOTE — Assessment & Plan Note (Signed)
Summary: F6M/DM  Medications Added * CALICUM 2daily * ACETAMINOPHEN as needed UNISOM 25 MG TABS (DOXYLAMINE SUCCINATE (SLEEP)) as needed      Allergies Added:   Visit Type:  Follow-up Primary Provider:  Dr [name]  CC:  no complaints.  History of Present Illness: [name] is seen today for F/U of PAF, LBBB, DCM with EF 20% no CAD by cath in 2009 S/P recent right hip surgery from fall.  She has had some continued right hip pain and is taking hydrocodone for it.  Her BP has been better on home readings.  Average systolics have been 12-140  mmHg after increasing her Benzipril to 40mg    she has not had any SSCP, palpitations, or syncope.  She has vertiginous symptoms with change in position  She also has stable mild LE edema.  She has had increasing dyspnea with exertion.  Lab work from her primary showed normal BUN/CR and lipids.  She needs a F/U BNP and Hct.   Current Problems (verified): 1)  Dyspnea  (ICD-786.05) 2)  Depressive Disorder Not Elsewhere Classified  (ICD-311) 3)  Cholelithiasis  (ICD-574.20) 4)  Dilation and Curettage, Hx of  (ICD-V45.89) 5)  Atrial Fibrillation  (ICD-427.31) 6)  Hepatic Cyst  (ICD-573.8) 7)  Gerd  (ICD-530.81) 8)  COPD  (ICD-496) 9)  Left Bundle Branch Block  (ICD-426.3) 10)  Diabetes Mellitus  (ICD-250.00) 11)  Hypertension, Benign  (ICD-401.1) 12)  Hyperlipidemia-mixed  (ICD-272.4) 13)  Cardiomyopathy,nonischemic  (ICD-425.4)  Current Medications (verified): 1)  Simvastatin 40 Mg Tabs (Simvastatin) .... Take One Tablet By Mouth Daily At Bedtime 2)  Furosemide 40 Mg Tabs (Furosemide) .Marland Kitchen.. 1 Tab By Mouth Once Daily 3)  Potassium Chloride Crys Cr 20 Meq Cr-Tabs (Potassium Chloride Crys Cr) .Marland Kitchen.. 1 Tab By Mouth Once Daily 4)  Fish Oil   Oil (Fish Oil) .Marland Kitchen.. 1 Tab By Mouth Once Daily 5)  Vitamin D3 2000 Unit Caps (Cholecalciferol) .Marland Kitchen.. 1 Tab By Mouth Once Daily 6)  Aspirin 81 Mg  Tabs (Aspirin) .Marland Kitchen.. 1 Tab By Mouth Once Daily 7)  Miralax  Powd  (Polyethylene Glycol 3350) .... As Needed 8)  Carvedilol 25 Mg Tabs (Carvedilol) .... Take One Tablet By Mouth Twice A Day 9)  Benazepril Hcl 40 Mg Tabs (Benazepril Hcl) .... One Tablet Once Daily 10)  Sertraline Hcl 50 Mg Tabs (Sertraline Hcl) .Marland Kitchen.. 1 Tab By Mouth Once Daily 11)  Sm Calcium Antacid Ultra St 1000 Mg Chew (Calcium Carbonate Antacid) .Marland Kitchen.. 1 Tab By Mouth Once Daily 12)  Hydrocodone-Acetaminophen 5-325 Mg Tabs (Hydrocodone-Acetaminophen) .... As Needed 13)  Hydralazine Hcl 25 Mg Tabs (Hydralazine Hcl) .... Take One Tablet By Mouth Three Times A Day 14)  Amoxicillin 500 Mg Caps (Amoxicillin) .... 4 Tabs By Mouth Once Daily 15)  Calicum .... 2daily 16)  Acetaminophen .... As Needed 17)  Unisom 25 Mg Tabs (Doxylamine Succinate (Sleep)) .... As Needed  Allergies (verified): 1)  ! Pcn 2)  ! Sulfa 3)  ! * Tetanus  Past History:  Past Medical History: Last updated: 11/08/2008 CHOLELITHIASIS (ICD-574.20) DILATION AND CURETTAGE, HX OF (ICD-V45.89) ATRIAL FIBRILLATION (ICD-427.31) HEPATIC CYST (ICD-573.8) GERD (ICD-530.81) COPD (ICD-496) LEFT BUNDLE BRANCH BLOCK (ICD-426.3) DIABETES MELLITUS (ICD-250.00) HYPERTENSION, BENIGN (ICD-401.1) HYPERLIPIDEMIA-MIXED (ICD-272.4) CARDIOMYOPATHY,NONISCHEMIC (ICD-425.4) Hx, of Dizziness with some gait disability Hx, of peripheral edema  Past Surgical History: Last updated: 11/08/2008 hx of wrist surgery DILATION AND CURETTAGE, HX OF (ICD-V45.89)  Family History: Last updated: 11/08/2008 non-contributory  Social History: Last updated: 08/02/2009 Retired  Widowed  Non-smoker Non-drinker  Review of Systems       Denies fever, malais, weight loss, blurry vision, decreased visual acuity, cough, sputum,  hemoptysis, pleuritic pain, palpitaitons, heartburn, abdominal pain, melena,, claudication, or rash.   Vital Signs:  Patient profile:   77 year old female Height:      70 inches Weight:      198 pounds BMI:      28.51 Pulse rate:   77 / minute BP sitting:   125 / 72  (left arm) Cuff size:   large  Vitals Entered By: Burnett Kanaris, CNA (July 31, 2010 1:57 PM)  Physical Exam  General:  Affect appropriate Healthy:  appears stated age HEENT: normal Neck supple with no adenopathy JVP normal no bruits no thyromegaly Lungs clear with no wheezing and good diaphragmatic motion Heart:  S1/S2 no murmur,rub, gallop or click PMI normal Abdomen: benighn, BS positve, no tenderness, no AAA no bruit.  No HSM or HJR Distal pulses intact with no bruits No edema Neuro non-focal Skin warm and dry    Impression & Recommendations:  Problem # 1:  DYSPNEA (ICD-786.05) Likely related to DCM.  On optimal medical Rx.  Check echo and BNP Her updated medication list for this problem includes:    Furosemide 40 Mg Tabs (Furosemide) .Marland Kitchen... 1 tab by mouth once daily    Aspirin 81 Mg Tabs (Aspirin) .Marland Kitchen... 1 tab by mouth once daily    Carvedilol 25 Mg Tabs (Carvedilol) .Marland Kitchen... Take one tablet by mouth twice a day    Benazepril Hcl 40 Mg Tabs (Benazepril hcl) ..... One tablet once daily  Orders: Echocardiogram (Echo) TLB-CBC Platelet - w/Differential (85025-CBCD) TLB-BNP (B-Natriuretic Peptide) (83880-BNPR)  Problem # 2:  ATRIAL FIBRILLATION (ICD-427.31) In NSR No palpitations.  No coumadin secondary to age and instability on feet Her updated medication list for this problem includes:    Aspirin 81 Mg Tabs (Aspirin) .Marland Kitchen... 1 tab by mouth once daily    Carvedilol 25 Mg Tabs (Carvedilol) .Marland Kitchen... Take one tablet by mouth twice a day  Problem # 3:  HYPERTENSION, BENIGN (ICD-401.1) Well controlled Her updated medication list for this problem includes:    Furosemide 40 Mg Tabs (Furosemide) .Marland Kitchen... 1 tab by mouth once daily    Aspirin 81 Mg Tabs (Aspirin) .Marland Kitchen... 1 tab by mouth once daily    Carvedilol 25 Mg Tabs (Carvedilol) .Marland Kitchen... Take one tablet by mouth twice a day    Benazepril Hcl 40 Mg Tabs (Benazepril hcl) .....  One tablet once daily    Hydralazine Hcl 25 Mg Tabs (Hydralazine hcl) .Marland Kitchen... Take one tablet by mouth three times a day  Her updated medication list for this problem includes:    Furosemide 40 Mg Tabs (Furosemide) .Marland Kitchen... 1 tab by mouth once daily    Aspirin 81 Mg Tabs (Aspirin) .Marland Kitchen... 1 tab by mouth once daily    Carvedilol 25 Mg Tabs (Carvedilol) .Marland Kitchen... Take one tablet by mouth twice a day    Benazepril Hcl 40 Mg Tabs (Benazepril hcl) ..... One tablet once daily    Hydralazine Hcl 25 Mg Tabs (Hydralazine hcl) .Marland Kitchen... Take one tablet by mouth three times a day  Problem # 4:  LEFT BUNDLE BRANCH BLOCK (ICD-426.3)  Stable with no evidence of high grade heart block Her updated medication list for this problem includes:    Aspirin 81 Mg Tabs (Aspirin) .Marland Kitchen... 1 tab by mouth once daily    Carvedilol 25 Mg Tabs (Carvedilol) .Marland Kitchen... Take one  tablet by mouth twice a day    Benazepril Hcl 40 Mg Tabs (Benazepril hcl) ..... One tablet once daily  Her updated medication list for this problem includes:    Aspirin 81 Mg Tabs (Aspirin) .Marland Kitchen... 1 tab by mouth once daily    Carvedilol 25 Mg Tabs (Carvedilol) .Marland Kitchen... Take one tablet by mouth twice a day    Benazepril Hcl 40 Mg Tabs (Benazepril hcl) ..... One tablet once daily  Patient Instructions: 1)  Your physician recommends that you schedule a follow-up appointment in: 6 MONTHS WITH DR Eden Emms 2)  Your physician recommends that you continue on your current medications as directed. Please refer to the Current Medication list given to you today. 3)  Your physician has requested that you have an echocardiogram.  Echocardiography is a painless test that uses sound waves to create images of your heart. It provides your doctor with information about the size and shape of your heart and how well your heart's chambers and valves are working.  This procedure takes approximately one hour. There are no restrictions for this procedure. 4)  Your physician recommends that you return  for lab work [id number] BNP CBC

## 2011-01-15 ENCOUNTER — Encounter: Payer: Self-pay | Admitting: Cardiovascular Disease

## 2011-01-24 ENCOUNTER — Encounter: Payer: Self-pay | Admitting: Cardiovascular Disease

## 2011-01-24 ENCOUNTER — Ambulatory Visit (INDEPENDENT_AMBULATORY_CARE_PROVIDER_SITE_OTHER): Payer: Medicare PPO | Admitting: Cardiovascular Disease

## 2011-01-24 DIAGNOSIS — I447 Left bundle-branch block, unspecified: Secondary | ICD-10-CM

## 2011-01-24 DIAGNOSIS — I10 Essential (primary) hypertension: Secondary | ICD-10-CM

## 2011-01-24 DIAGNOSIS — I509 Heart failure, unspecified: Secondary | ICD-10-CM

## 2011-01-29 NOTE — Assessment & Plan Note (Signed)
Summary: 6 MONTH D.MILLER/JT  Medications Added * MILK OF MAGNESIA as needed ALIGN  CAPS (PROBIOTIC PRODUCT) 1 tab by mouth once daily BENADRYL 25 MG CAPS (DIPHENHYDRAMINE HCL) as needed      Allergies Added:   Primary Provider:  Dr [name]   History of Present Illness: [name] is seen today for F/U of PAF, LBBB, DCM with EF 20% no CAD by cath in 2009 S/P recent right hip surgery from fall.  She has had some continued right hip pain and is taking hydrocodone for it.  Her BP has been better on home readings.  Average systolics have been 12-140  mmHg after increasing her Benzipril to 40mg    she has not had any SSCP, palpitations, or syncope.  She has vertiginous symptoms with change in position  She also has stable mild LE edema.  She has had increasing dyspnea with exertion.  Lab work from her primary showed normal BUN/CR and lipids.  She needs a F/U BNP and Hct.  Left hip still bothering her.  Using 3 pt cane today.  F/U Dr Jillyn Hidden    Current Problems (verified): 1)  Dyspnea  (ICD-786.05) 2)  Depressive Disorder Not Elsewhere Classified  (ICD-311) 3)  Cholelithiasis  (ICD-574.20) 4)  Dilation and Curettage, Hx of  (ICD-V45.89) 5)  Atrial Fibrillation  (ICD-427.31) 6)  Hepatic Cyst  (ICD-573.8) 7)  Gerd  (ICD-530.81) 8)  COPD  (ICD-496) 9)  Left Bundle Branch Block  (ICD-426.3) 10)  Diabetes Mellitus  (ICD-250.00) 11)  Hypertension, Benign  (ICD-401.1) 12)  Hyperlipidemia-mixed  (ICD-272.4) 13)  Cardiomyopathy,nonischemic  (ICD-425.4)  Current Medications (verified): 1)  Simvastatin 40 Mg Tabs (Simvastatin) .... Take One Tablet By Mouth Daily At Bedtime 2)  Furosemide 40 Mg Tabs (Furosemide) .Marland Kitchen.. 1 Tab By Mouth Once Daily 3)  Potassium Chloride Crys Cr 20 Meq Cr-Tabs (Potassium Chloride Crys Cr) .Marland Kitchen.. 1 Tab By Mouth Once Daily 4)  Fish Oil   Oil (Fish Oil) .Marland Kitchen.. 1 Tab By Mouth Once Daily 5)  Vitamin D3 2000 Unit Caps (Cholecalciferol) .Marland Kitchen.. 1 Tab By Mouth Once Daily 6)   Aspirin 81 Mg  Tabs (Aspirin) .Marland Kitchen.. 1 Tab By Mouth Once Daily 7)  Miralax  Powd (Polyethylene Glycol 3350) .... As Needed 8)  Carvedilol 25 Mg Tabs (Carvedilol) .... Take One Tablet By Mouth Twice A Day 9)  Benazepril Hcl 40 Mg Tabs (Benazepril Hcl) .... One Tablet Once Daily 10)  Sertraline Hcl 50 Mg Tabs (Sertraline Hcl) .Marland Kitchen.. 1 Tab By Mouth Once Daily 11)  Sm Calcium Antacid Ultra St 1000 Mg Chew (Calcium Carbonate Antacid) .Marland Kitchen.. 1 Tab By Mouth Once Daily 12)  Hydrocodone-Acetaminophen 5-325 Mg Tabs (Hydrocodone-Acetaminophen) .... As Needed 13)  Hydralazine Hcl 25 Mg Tabs (Hydralazine Hcl) .... Take One Tablet By Mouth Three Times A Day 14)  Amoxicillin 500 Mg Caps (Amoxicillin) .... 4 Tabs By Mouth Once Daily 15)  Calicum .... 2daily 16)  Acetaminophen .... As Needed 17)  Unisom 25 Mg Tabs (Doxylamine Succinate (Sleep)) .... As Needed 18)  Milk of Magnesia .... As Needed 19)  Align  Caps (Probiotic Product) .Marland Kitchen.. 1 Tab By Mouth Once Daily 20)  Benadryl 25 Mg Caps (Diphenhydramine Hcl) .... As Needed  Allergies (verified): 1)  ! Pcn 2)  ! Sulfa 3)  ! * Tetanus  Past History:  Past Medical History: Last updated: 11/08/2008 CHOLELITHIASIS (ICD-574.20) DILATION AND CURETTAGE, HX OF (ICD-V45.89) ATRIAL FIBRILLATION (ICD-427.31) HEPATIC CYST (ICD-573.8) GERD (ICD-530.81) COPD (ICD-496) LEFT BUNDLE BRANCH BLOCK (ICD-426.3)  DIABETES MELLITUS (ICD-250.00) HYPERTENSION, BENIGN (ICD-401.1) HYPERLIPIDEMIA-MIXED (ICD-272.4) CARDIOMYOPATHY,NONISCHEMIC (ICD-425.4) Hx, of Dizziness with some gait disability Hx, of peripheral edema  Past Surgical History: Last updated: 11/08/2008 hx of wrist surgery DILATION AND CURETTAGE, HX OF (ICD-V45.89)  Family History: Last updated: 11/08/2008 non-contributory  Social History: Last updated: 08/02/2009 Retired  Widowed  Non-smoker Non-drinker  Review of Systems       Denies fever, malais, weight loss, blurry vision, decreased visual  acuity, cough, sputum, SOB, hemoptysis, pleuritic pain, palpitaitons, heartburn, abdominal pain, melena, lower extremity edema, claudication, or rash.   Vital Signs:  Patient profile:   77 year old female Height:      70 inches Weight:      200 pounds BMI:     28.80 Pulse rate:   66 / minute Resp:     12 per minute BP sitting:   134 / 71  (left arm)  Vitals Entered By: [name] (January 24, 2011 12:03 PM)  Physical Exam  General:  Affect appropriate Healthy:  appears stated age HEENT: normal Neck supple with no adenopathy JVP normal no bruits no thyromegaly Lungs clear with no wheezing and good diaphragmatic motion Heart:  S1/S2 no murmur,rub, gallop or click PMI normal Abdomen: benighn, BS positve, no tenderness, no AAA no bruit.  No HSM or HJR Distal pulses intact with no bruits No edema Neuro non-focal Skin warm and dry    Impression & Recommendations:  Problem # 1:  LEFT BUNDLE BRANCH BLOCK (ICD-426.3) Stable no indicdation for pacer Her updated medication list for this problem includes:    Aspirin 81 Mg Tabs (Aspirin) .Marland Kitchen... 1 tab by mouth once daily    Carvedilol 25 Mg Tabs (Carvedilol) .Marland Kitchen... Take one tablet by mouth twice a day    Benazepril Hcl 40 Mg Tabs (Benazepril hcl) ..... One tablet once daily  Problem # 2:  HYPERTENSION, BENIGN (ICD-401.1) Improved control  Only taking hydralazine bid Her updated medication list for this problem includes:    Furosemide 40 Mg Tabs (Furosemide) .Marland Kitchen... 1 tab by mouth once daily    Aspirin 81 Mg Tabs (Aspirin) .Marland Kitchen... 1 tab by mouth once daily    Carvedilol 25 Mg Tabs (Carvedilol) .Marland Kitchen... Take one tablet by mouth twice a day    Benazepril Hcl 40 Mg Tabs (Benazepril hcl) ..... One tablet once daily    Hydralazine Hcl 25 Mg Tabs (Hydralazine hcl) .Marland Kitchen... Take one tablet by mouth three times a day  Problem # 3:  CARDIOMYOPATHY,NONISCHEMIC (ICD-425.4) Funcitonal class one limited by orthopedic issues  Continue current  meds Her updated medication list for this problem includes:    Furosemide 40 Mg Tabs (Furosemide) .Marland Kitchen... 1 tab by mouth once daily    Aspirin 81 Mg Tabs (Aspirin) .Marland Kitchen... 1 tab by mouth once daily    Carvedilol 25 Mg Tabs (Carvedilol) .Marland Kitchen... Take one tablet by mouth twice a day    Benazepril Hcl 40 Mg Tabs (Benazepril hcl) ..... One tablet once daily  Problem # 4:  HYPERLIPIDEMIA-MIXED (ICD-272.4) Continue statin LFT's ok Her updated medication list for this problem includes:    Simvastatin 40 Mg Tabs (Simvastatin) .Marland Kitchen... Take one tablet by mouth daily at bedtime  Patient Instructions: 1)  Your physician wants you to follow-up in:  6 MONTHS  You will receive a reminder letter in the mail two months in advance. If you don't receive a letter, please call our office to schedule the follow-up appointment.   EKG Report  Procedure date:  01/24/2011  Findings:      NSR LBBB No change

## 2011-02-08 ENCOUNTER — Inpatient Hospital Stay (INDEPENDENT_AMBULATORY_CARE_PROVIDER_SITE_OTHER)
Admission: RE | Admit: 2011-02-08 | Discharge: 2011-02-08 | Disposition: A | Discharge status: Home / Self Care | Payer: Medicare PPO | Source: Ambulatory Visit | Attending: Family Medicine | Admitting: Family Medicine

## 2011-02-08 ENCOUNTER — Ambulatory Visit (INDEPENDENT_AMBULATORY_CARE_PROVIDER_SITE_OTHER): Payer: Medicare PPO

## 2011-02-08 DIAGNOSIS — J069 Acute upper respiratory infection, unspecified: Secondary | ICD-10-CM

## 2011-02-08 DIAGNOSIS — J189 Pneumonia, unspecified organism: Secondary | ICD-10-CM

## 2011-02-21 LAB — CBC
HCT: 25.7 % — ABNORMAL LOW (ref 36.0–46.0)
HCT: 26 % — ABNORMAL LOW (ref 36.0–46.0)
HCT: 26.6 % — ABNORMAL LOW (ref 36.0–46.0)
HCT: 28.8 % — ABNORMAL LOW (ref 36.0–46.0)
HCT: 29.4 % — ABNORMAL LOW (ref 36.0–46.0)
Hemoglobin: 10.1 g/dL — ABNORMAL LOW (ref 12.0–15.0)
Hemoglobin: 10.4 g/dL — ABNORMAL LOW (ref 12.0–15.0)
Hemoglobin: 13.4 g/dL (ref 12.0–15.0)
Hemoglobin: 9.2 g/dL — ABNORMAL LOW (ref 12.0–15.0)
Hemoglobin: 9.2 g/dL — ABNORMAL LOW (ref 12.0–15.0)
Hemoglobin: 9.4 g/dL — ABNORMAL LOW (ref 12.0–15.0)
MCHC: 35.2 g/dL (ref 30.0–36.0)
MCHC: 35.3 g/dL (ref 30.0–36.0)
MCV: 97.9 fL (ref 78.0–100.0)
Platelets: 110 10*3/uL — ABNORMAL LOW (ref 150–400)
Platelets: 85 10*3/uL — ABNORMAL LOW (ref 150–400)
RBC: 2.65 MIL/uL — ABNORMAL LOW (ref 3.87–5.11)
RBC: 2.72 MIL/uL — ABNORMAL LOW (ref 3.87–5.11)
RBC: 3.88 MIL/uL (ref 3.87–5.11)
RDW: 13.4 % (ref 11.5–15.5)
RDW: 13.4 % (ref 11.5–15.5)
WBC: 12.1 10*3/uL — ABNORMAL HIGH (ref 4.0–10.5)
WBC: 12.9 10*3/uL — ABNORMAL HIGH (ref 4.0–10.5)
WBC: 9.6 10*3/uL (ref 4.0–10.5)

## 2011-02-21 LAB — DIFFERENTIAL
Basophils Absolute: 0 10*3/uL (ref 0.0–0.1)
Basophils Relative: 0 % (ref 0–1)
Eosinophils Absolute: 0.6 10*3/uL (ref 0.0–0.7)
Eosinophils Relative: 6 % — ABNORMAL HIGH (ref 0–5)
Eosinophils Relative: 7 % — ABNORMAL HIGH (ref 0–5)
Lymphocytes Relative: 9 % — ABNORMAL LOW (ref 12–46)
Lymphs Abs: 0.7 10*3/uL (ref 0.7–4.0)
Lymphs Abs: 1.2 10*3/uL (ref 0.7–4.0)
Monocytes Absolute: 1 10*3/uL (ref 0.1–1.0)
Monocytes Relative: 10 % (ref 3–12)
Monocytes Relative: 10 % (ref 3–12)
Neutrophils Relative %: 77 % (ref 43–77)

## 2011-02-21 LAB — CARDIAC PANEL(CRET KIN+CKTOT+MB+TROPI)
CK, MB: 2 ng/mL (ref 0.3–4.0)
CK, MB: 2.2 ng/mL (ref 0.3–4.0)
Relative Index: 1 (ref 0.0–2.5)
Total CK: 210 U/L — ABNORMAL HIGH (ref 7–177)
Troponin I: 0.01 ng/mL (ref 0.00–0.06)

## 2011-02-21 LAB — PROTIME-INR: INR: 1.54 — ABNORMAL HIGH (ref 0.00–1.49)

## 2011-02-21 LAB — BASIC METABOLIC PANEL
CO2: 23 mEq/L (ref 19–32)
CO2: 25 mEq/L (ref 19–32)
Calcium: 7.7 mg/dL — ABNORMAL LOW (ref 8.4–10.5)
Chloride: 107 mEq/L (ref 96–112)
Creatinine, Ser: 1.4 mg/dL — ABNORMAL HIGH (ref 0.4–1.2)
Creatinine, Ser: 1.63 mg/dL — ABNORMAL HIGH (ref 0.4–1.2)
GFR calc Af Amer: 36 mL/min — ABNORMAL LOW (ref >60)
GFR calc Af Amer: 43 mL/min — ABNORMAL LOW (ref >60)
GFR calc non Af Amer: 30 mL/min — ABNORMAL LOW (ref >60)
GFR calc non Af Amer: 56 mL/min — ABNORMAL LOW (ref >60)
Glucose, Bld: 159 mg/dL — ABNORMAL HIGH (ref 70–99)
Potassium: 3.5 mEq/L (ref 3.5–5.1)
Potassium: 3.8 mEq/L (ref 3.5–5.1)
Sodium: 137 mEq/L (ref 135–145)

## 2011-02-21 LAB — BRAIN NATRIURETIC PEPTIDE
Pro B Natriuretic peptide (BNP): 110 pg/mL — ABNORMAL HIGH (ref 0.0–100.0)
Pro B Natriuretic peptide (BNP): 60 pg/mL (ref 0.0–100.0)

## 2011-02-21 LAB — CROSSMATCH
ABO/RH(D): O POS
Antibody Screen: NEGATIVE

## 2011-02-22 LAB — CBC
HCT: 39.5 % (ref 36.0–46.0)
MCHC: 34.9 g/dL (ref 30.0–36.0)
MCV: 97.3 fL (ref 78.0–100.0)
Platelets: 183 10*3/uL (ref 150–400)
WBC: 9.7 10*3/uL (ref 4.0–10.5)

## 2011-02-22 LAB — URINALYSIS, ROUTINE W REFLEX MICROSCOPIC
Ketones, ur: NEGATIVE mg/dL
Nitrite: NEGATIVE
Protein, ur: NEGATIVE mg/dL

## 2011-02-22 LAB — BASIC METABOLIC PANEL
BUN: 18 mg/dL (ref 6–23)
CO2: 22 mEq/L (ref 19–32)
Chloride: 111 mEq/L (ref 96–112)
Creatinine, Ser: 0.74 mg/dL (ref 0.4–1.2)
Potassium: 4 mEq/L (ref 3.5–5.1)

## 2011-02-22 LAB — PROTIME-INR
INR: 1.03 (ref 0.00–1.49)
Prothrombin Time: 13.4 seconds (ref 11.6–15.2)

## 2011-02-22 LAB — DIFFERENTIAL
Basophils Relative: 0 % (ref 0–1)
Eosinophils Absolute: 0.4 10*3/uL (ref 0.0–0.7)
Eosinophils Relative: 4 % (ref 0–5)
Lymphs Abs: 1.5 10*3/uL (ref 0.7–4.0)
Monocytes Relative: 8 % (ref 3–12)
Neutrophils Relative %: 73 % (ref 43–77)

## 2011-02-22 LAB — URINE CULTURE: Colony Count: NO GROWTH

## 2011-02-26 LAB — BASIC METABOLIC PANEL
BUN: 11 mg/dL (ref 6–23)
BUN: 25 mg/dL — ABNORMAL HIGH (ref 6–23)
BUN: 26 mg/dL — ABNORMAL HIGH (ref 6–23)
CO2: 22 mEq/L (ref 19–32)
CO2: 24 mEq/L (ref 19–32)
CO2: 27 mEq/L (ref 19–32)
Calcium: 8.5 mg/dL (ref 8.4–10.5)
Chloride: 112 mEq/L (ref 96–112)
Chloride: 115 mEq/L — ABNORMAL HIGH (ref 96–112)
Creatinine, Ser: 0.79 mg/dL (ref 0.4–1.2)
Creatinine, Ser: 0.81 mg/dL (ref 0.4–1.2)
Creatinine, Ser: 1.24 mg/dL — ABNORMAL HIGH (ref 0.4–1.2)
GFR calc Af Amer: 50 mL/min — ABNORMAL LOW (ref >60)
GFR calc Af Amer: >60 mL/min (ref >60)
GFR calc non Af Amer: 41 mL/min — ABNORMAL LOW (ref >60)
GFR calc non Af Amer: 55 mL/min — ABNORMAL LOW (ref >60)
Glucose, Bld: 101 mg/dL — ABNORMAL HIGH (ref 70–99)
Glucose, Bld: 93 mg/dL (ref 70–99)
Glucose, Bld: 94 mg/dL (ref 70–99)
Potassium: 3.4 mEq/L — ABNORMAL LOW (ref 3.5–5.1)
Potassium: 4 mEq/L (ref 3.5–5.1)
Sodium: 140 mEq/L (ref 135–145)
Sodium: 144 mEq/L (ref 135–145)

## 2011-02-26 LAB — FECAL LACTOFERRIN, QUANT: Fecal Lactoferrin: POSITIVE

## 2011-02-26 LAB — HEMOGLOBIN AND HEMATOCRIT, BLOOD
HCT: 32.8 % — ABNORMAL LOW (ref 36.0–46.0)
HCT: 34.8 % — ABNORMAL LOW (ref 36.0–46.0)
Hemoglobin: 11.6 g/dL — ABNORMAL LOW (ref 12.0–15.0)
Hemoglobin: 12.4 g/dL (ref 12.0–15.0)

## 2011-02-26 LAB — CBC
HCT: 30.6 % — ABNORMAL LOW (ref 36.0–46.0)
HCT: 31.8 % — ABNORMAL LOW (ref 36.0–46.0)
Hemoglobin: 10.9 g/dL — ABNORMAL LOW (ref 12.0–15.0)
MCHC: 35.4 g/dL (ref 30.0–36.0)
MCHC: 35.8 g/dL (ref 30.0–36.0)
MCV: 95.7 fL (ref 78.0–100.0)
Platelets: 136 10*3/uL — ABNORMAL LOW (ref 150–400)
Platelets: 141 10*3/uL — ABNORMAL LOW (ref 150–400)
Platelets: 146 10*3/uL — ABNORMAL LOW (ref 150–400)
RBC: 3.83 MIL/uL — ABNORMAL LOW (ref 3.87–5.11)
RDW: 12.8 % (ref 11.5–15.5)
RDW: 13.1 % (ref 11.5–15.5)
WBC: 5.9 10*3/uL (ref 4.0–10.5)
WBC: 9.2 10*3/uL (ref 4.0–10.5)

## 2011-02-26 LAB — STOOL CULTURE

## 2011-02-26 LAB — PROTIME-INR
INR: 1.3 (ref 0.00–1.49)
Prothrombin Time: 16.6 seconds — ABNORMAL HIGH (ref 11.6–15.2)

## 2011-02-26 LAB — HEMOCCULT GUIAC POC 1CARD (OFFICE): Fecal Occult Bld: NEGATIVE

## 2011-02-26 LAB — GLUCOSE, CAPILLARY
Glucose-Capillary: 100 mg/dL — ABNORMAL HIGH (ref 70–99)
Glucose-Capillary: 102 mg/dL — ABNORMAL HIGH (ref 70–99)
Glucose-Capillary: 107 mg/dL — ABNORMAL HIGH (ref 70–99)
Glucose-Capillary: 180 mg/dL — ABNORMAL HIGH (ref 70–99)
Glucose-Capillary: 93 mg/dL (ref 70–99)
Glucose-Capillary: 98 mg/dL (ref 70–99)

## 2011-02-26 LAB — APTT: aPTT: 40 seconds — ABNORMAL HIGH (ref 24–37)

## 2011-02-26 LAB — DIFFERENTIAL
Basophils Relative: 0 % (ref 0–1)
Eosinophils Absolute: 0.8 10*3/uL — ABNORMAL HIGH (ref 0.0–0.7)
Eosinophils Relative: 14 % — ABNORMAL HIGH (ref 0–5)
Lymphs Abs: 1.5 10*3/uL (ref 0.7–4.0)

## 2011-02-26 LAB — CLOSTRIDIUM DIFFICILE EIA: C difficile Toxins A+B, EIA: NEGATIVE

## 2011-02-27 LAB — URINALYSIS, ROUTINE W REFLEX MICROSCOPIC
Bilirubin Urine: NEGATIVE
Glucose, UA: NEGATIVE mg/dL
Nitrite: NEGATIVE
Specific Gravity, Urine: 1.023 (ref 1.005–1.030)
pH: 7.5 (ref 5.0–8.0)

## 2011-02-27 LAB — URINE CULTURE

## 2011-02-27 LAB — GLUCOSE, CAPILLARY
Glucose-Capillary: 154 mg/dL — ABNORMAL HIGH (ref 70–99)
Glucose-Capillary: 87 mg/dL (ref 70–99)

## 2011-02-27 LAB — CBC
HCT: 36.5 % (ref 36.0–46.0)
MCV: 96.4 fL (ref 78.0–100.0)
RBC: 3.79 MIL/uL — ABNORMAL LOW (ref 3.87–5.11)
WBC: 10.1 10*3/uL (ref 4.0–10.5)

## 2011-02-27 LAB — DIFFERENTIAL
Basophils Absolute: 0 10*3/uL (ref 0.0–0.1)
Lymphocytes Relative: 3 % — ABNORMAL LOW (ref 12–46)
Neutro Abs: 9.2 10*3/uL — ABNORMAL HIGH (ref 1.7–7.7)
Neutrophils Relative %: 91 % — ABNORMAL HIGH (ref 43–77)

## 2011-02-27 LAB — COMPREHENSIVE METABOLIC PANEL
BUN: 20 mg/dL (ref 6–23)
CO2: 23 mEq/L (ref 19–32)
Chloride: 107 mEq/L (ref 96–112)
Creatinine, Ser: 0.91 mg/dL (ref 0.4–1.2)
GFR calc non Af Amer: 59 mL/min — ABNORMAL LOW (ref >60)
Total Bilirubin: 1 mg/dL (ref 0.3–1.2)

## 2011-02-27 LAB — POCT CARDIAC MARKERS
CKMB, poc: <1 ng/mL — ABNORMAL LOW (ref 1.0–8.0)
Troponin i, poc: <0.05 ng/mL (ref 0.00–0.09)

## 2011-02-27 LAB — CK TOTAL AND CKMB (NOT AT ARMC): CK, MB: 1.4 ng/mL (ref 0.3–4.0)

## 2011-02-27 LAB — CARDIAC PANEL(CRET KIN+CKTOT+MB+TROPI)
CK, MB: 1.5 ng/mL (ref 0.3–4.0)
Relative Index: INVALID (ref 0.0–2.5)
Total CK: 35 U/L (ref 7–177)
Troponin I: 0.01 ng/mL (ref 0.00–0.06)

## 2011-02-27 LAB — CULTURE, BLOOD (ROUTINE X 2)

## 2011-02-27 LAB — URINE MICROSCOPIC-ADD ON

## 2011-02-27 LAB — POCT I-STAT 3, ART BLOOD GAS (G3+)
Bicarbonate: 22.3 mEq/L (ref 20.0–24.0)
pH, Arterial: 7.475 — ABNORMAL HIGH (ref 7.350–7.400)
pO2, Arterial: 53 mmHg — ABNORMAL LOW (ref 80.0–100.0)

## 2011-02-27 LAB — BRAIN NATRIURETIC PEPTIDE: Pro B Natriuretic peptide (BNP): 106 pg/mL — ABNORMAL HIGH (ref 0.0–100.0)

## 2011-02-27 LAB — TSH: TSH: 1.995 u[IU]/mL (ref 0.350–4.500)

## 2011-03-04 LAB — CLOSTRIDIUM DIFFICILE EIA
C difficile Toxins A+B, EIA: NEGATIVE
C difficile Toxins A+B, EIA: NEGATIVE

## 2011-03-04 LAB — BASIC METABOLIC PANEL
BUN: 19 mg/dL (ref 6–23)
Calcium: 9 mg/dL (ref 8.4–10.5)
Chloride: 102 mEq/L (ref 96–112)
Creatinine, Ser: 1.21 mg/dL — ABNORMAL HIGH (ref 0.4–1.2)
GFR calc non Af Amer: 57 mL/min — ABNORMAL LOW (ref >60)
Glucose, Bld: 99 mg/dL (ref 70–99)
Potassium: 4.4 mEq/L (ref 3.5–5.1)
Sodium: 140 mEq/L (ref 135–145)

## 2011-03-04 LAB — POCT I-STAT, CHEM 8
BUN: 24 mg/dL — ABNORMAL HIGH (ref 6–23)
Chloride: 102 mEq/L (ref 96–112)
Potassium: 3.4 mEq/L — ABNORMAL LOW (ref 3.5–5.1)
Sodium: 139 mEq/L (ref 135–145)

## 2011-03-04 LAB — DIFFERENTIAL
Basophils Absolute: 0 10*3/uL (ref 0.0–0.1)
Basophils Relative: 0 % (ref 0–1)
Eosinophils Absolute: 0.1 10*3/uL (ref 0.0–0.7)
Lymphs Abs: 1.1 10*3/uL (ref 0.7–4.0)
Monocytes Relative: 4 % (ref 3–12)
Neutro Abs: 14 10*3/uL — ABNORMAL HIGH (ref 1.7–7.7)
Neutro Abs: 2.5 10*3/uL (ref 1.7–7.7)
Neutrophils Relative %: 51 % (ref 43–77)
Neutrophils Relative %: 88 % — ABNORMAL HIGH (ref 43–77)

## 2011-03-04 LAB — URINALYSIS, ROUTINE W REFLEX MICROSCOPIC
Glucose, UA: NEGATIVE mg/dL
Hgb urine dipstick: NEGATIVE
Ketones, ur: 15 mg/dL — AB
Protein, ur: 30 mg/dL — AB
Urobilinogen, UA: 1 mg/dL (ref 0.0–1.0)

## 2011-03-04 LAB — GLUCOSE, CAPILLARY
Glucose-Capillary: 107 mg/dL — ABNORMAL HIGH (ref 70–99)
Glucose-Capillary: 109 mg/dL — ABNORMAL HIGH (ref 70–99)
Glucose-Capillary: 117 mg/dL — ABNORMAL HIGH (ref 70–99)
Glucose-Capillary: 120 mg/dL — ABNORMAL HIGH (ref 70–99)
Glucose-Capillary: 134 mg/dL — ABNORMAL HIGH (ref 70–99)
Glucose-Capillary: 96 mg/dL (ref 70–99)

## 2011-03-04 LAB — CBC
HCT: 33.8 % — ABNORMAL LOW (ref 36.0–46.0)
Hemoglobin: 11.7 g/dL — ABNORMAL LOW (ref 12.0–15.0)
Hemoglobin: 13.9 g/dL (ref 12.0–15.0)
MCHC: 34 g/dL (ref 30.0–36.0)
MCHC: 34.7 g/dL (ref 30.0–36.0)
MCV: 96.3 fL (ref 78.0–100.0)
Platelets: 159 10*3/uL (ref 150–400)
RDW: 13.3 % (ref 11.5–15.5)
WBC: 4.8 10*3/uL (ref 4.0–10.5)
WBC: 9.2 10*3/uL (ref 4.0–10.5)

## 2011-03-04 LAB — CK TOTAL AND CKMB (NOT AT ARMC)
CK, MB: 0.9 ng/mL (ref 0.3–4.0)
Relative Index: INVALID (ref 0.0–2.5)
Total CK: 33 U/L (ref 7–177)

## 2011-03-04 LAB — COMPREHENSIVE METABOLIC PANEL
ALT: 17 U/L (ref 0–35)
Alkaline Phosphatase: 40 U/L (ref 39–117)
BUN: 15 mg/dL (ref 6–23)
Calcium: 9 mg/dL (ref 8.4–10.5)
Chloride: 106 mEq/L (ref 96–112)
Glucose, Bld: 116 mg/dL — ABNORMAL HIGH (ref 70–99)
Glucose, Bld: 72 mg/dL (ref 70–99)
Potassium: 3.3 mEq/L — ABNORMAL LOW (ref 3.5–5.1)
Sodium: 138 mEq/L (ref 135–145)
Total Bilirubin: 1.1 mg/dL (ref 0.3–1.2)
Total Protein: 6.6 g/dL (ref 6.0–8.3)

## 2011-03-04 LAB — URINE MICROSCOPIC-ADD ON

## 2011-03-04 LAB — POCT CARDIAC MARKERS
CKMB, poc: <1 ng/mL — ABNORMAL LOW (ref 1.0–8.0)
Myoglobin, poc: 165 ng/mL (ref 12–200)

## 2011-03-04 LAB — URINE CULTURE: Colony Count: 45000

## 2011-03-04 LAB — CULTURE, BLOOD (ROUTINE X 2)

## 2011-03-04 LAB — LACTIC ACID, PLASMA: Lactic Acid, Venous: 2.5 mmol/L — ABNORMAL HIGH (ref 0.5–2.2)

## 2011-03-05 LAB — CBC
HCT: 37.8 % (ref 36.0–46.0)
Platelets: 166 10*3/uL (ref 150–400)
WBC: 6.3 10*3/uL (ref 4.0–10.5)

## 2011-03-05 LAB — COMPREHENSIVE METABOLIC PANEL
ALT: 15 U/L (ref 0–35)
AST: 25 U/L (ref 0–37)
Albumin: 3.3 g/dL — ABNORMAL LOW (ref 3.5–5.2)
Alkaline Phosphatase: 38 U/L — ABNORMAL LOW (ref 39–117)
BUN: 12 mg/dL (ref 6–23)
Chloride: 108 mEq/L (ref 96–112)
GFR calc Af Amer: >60 mL/min (ref >60)
Potassium: 5 mEq/L (ref 3.5–5.1)
Sodium: 143 mEq/L (ref 135–145)
Total Bilirubin: 1.2 mg/dL (ref 0.3–1.2)

## 2011-03-05 LAB — DIFFERENTIAL
Basophils Absolute: 0 10*3/uL (ref 0.0–0.1)
Basophils Relative: 0 % (ref 0–1)
Eosinophils Absolute: 0.4 10*3/uL (ref 0.0–0.7)
Eosinophils Relative: 6 % — ABNORMAL HIGH (ref 0–5)
Monocytes Absolute: 0.7 10*3/uL (ref 0.1–1.0)
Monocytes Relative: 11 % (ref 3–12)

## 2011-03-05 LAB — GLUCOSE, CAPILLARY
Glucose-Capillary: 102 mg/dL — ABNORMAL HIGH (ref 70–99)
Glucose-Capillary: 103 mg/dL — ABNORMAL HIGH (ref 70–99)
Glucose-Capillary: 97 mg/dL (ref 70–99)

## 2011-04-02 NOTE — Consult Note (Signed)
[name], [name]          ACCOUNT NO.:  [id number]   MEDICAL RECORD NO.:  [id number]          PATIENT TYPE:  OIB   LOCATION:  2924                         FACILITY:  [facility]   PHYSICIAN:  Everardo Beals. Juanda Chance, MD, FACCDATE OF BIRTH:  11-11-1923   DATE OF CONSULTATION:  01/13/2009  DATE OF DISCHARGE:                                 CONSULTATION   REQUESTING PHYSICIAN:  Sandria Bales. Ezzard Standing, M.D.   PRIMARY CARE PHYSICIAN:  Donia Guiles, M.D.   CARDIOLOGIST:  [name]. Eden Emms, MD, [facility]   PATIENT PROFILE:  An 77 year old Caucasian female with prior history of  dilated nonischemic cardiomyopathy and chronic systolic heart failure  who is status post laparoscopic cholecystectomy.  We are asked to  evaluate:  1. History of cholecystitis x2 status post laparoscopic      cholecystectomy today.  2. Nonischemic cardiomyopathy  2a.  June 05, 2001, cardiac catheterization, normal coronary artery.  2b.  Negative Myoview in 2007.  2c.  July 26, 2008, 2-D echocardiogram, EF 15-20%, diffuse LV  hypokinesis, mild diastolic dysfunction, mild-to-moderate MR and trivial  TR.  1. Chronic systolic congestive heart failure.  2. Chronic dyspnea on exertion.  3. Chronic dizziness.  4. Hypertension.  5. Hyperlipemia.  6. Diabetes mellitus, diet controlled.  7. Left bundle-branch block.  8. GERD.  9. Anxiety and depression.   HISTORY OF PRESENT ILLNESS:  An 77 year old Caucasian female with  history of dilated nonischemic cardiomyopathy and chronic systolic heart  failure who was recently seen by Dr. Eden Emms for preoperative clearance  pending laparoscopic cholecystectomy which took place earlier this  morning.  We have been asked to see her postoperatively to assist with  cardiac issues.  Currently, she denies chest pain, shortness of breath,  PND, orthopnea, nausea, vomiting, dizziness, syncope, or edema.   ALLERGIES:  PENICILLIN, SULFA, TETANUS TOXIN.   HOME MEDICATIONS:  1. Aspirin 81 mg  daily.  2. Coreg 25 mg b.i.d.  3. Benazepril 20 mg daily.  4. Lipitor 20 mg at bedtime.  5. Prilosec 20 mg daily.  6. Lorazepam 0.5 mg at bedtime.  7. Sertraline 25 mg at bedtime.  8. Lasix 20 mg daily.  9. Potassium 20 mEq daily.  10.Fish oil 1200 mg daily.  11.Vitamin D3 daily.  12.Mucinex p.r.n.  13.Tylenol Extra Strength p.r.n.   FAMILY HISTORY:  No history of early coronary artery disease, otherwise  noncontributory.   SOCIAL HISTORY:  The patient lives in [place] by herself.  She is a  retired from [employer].  She never smoked.  Denies alcohol or drug use.  She is not very active at home.   REVIEW OF SYSTEMS:  Positive for mild diffuse abdominal discomfort  related to her surgery, otherwise all systems reviewed are negative.  She is a full code.   PHYSICAL EXAMINATION:  VITAL SIGNS:  She is afebrile.  Heart rate 62,  respirations 13, blood pressure 143/53, pulse ox 97% on room air.  GENERAL:  A pleasant, white female in no acute distress.  Awake, alert,  and oriented x3.  HEENT:  Normal.  NEURO:  Grossly intact, nonfocal.  PSYCHIATRIC:  Normal  affect.  SKIN:  Warm and dry without lesions or mass.  NECK:  No bruits or JVD.  LUNGS:  Regular aspirations, unlabored.  Clear to auscultation.  CARDIAC:  Regular distant S1, S2.  No S3, S4 or murmurs.  ABDOMEN:  Soft and diffusely tender.  Bowel sounds present x4.  EXTREMITIES:  Warm, dry, and pink.  No clubbing, cyanosis or edema.  Dorsalis pedis, posterior tibial pulses 2+ and equal bilaterally.  MUSCULOSKELETAL:  Grossly normal without deformity or effusions.   LABORATORY DATA:  Lab work from February 24, hemoglobin 13.3, hematocrit  37.8, WBC 6.3, platelets 166.  Sodium 143, potassium 5.0, chloride 108,  CO2 of 29, BUN 12, creatinine 0.83, glucose 115.  Total bilirubin 1.2,  alkaline phosphatase 15, AST 38, ALT 25, total protein 6.5, albumin 3.3,  and calcium 9.5.   ASSESSMENT AND PLAN:  1. Nonischemic  cardiomyopathy/chronic systolic congestive heart      failure, euvolemic without complaints.  Continue beta-blocker which      is currently written for daily, although she takes it b.i.d. at      home, and we will alter that.  She will continue her ACE inhibitor.      Watch volume status closely.  Continue home Lasix dose.  2. Status post laparoscopic cholecystectomy per Surgery.     Nicolasa Ducking, ANP      Bruce R. Juanda Chance, MD, [facility]  Electronically Signed   CB/MEDQ  D:  01/13/2009  T:  01/14/2009  Job:  [id number]

## 2011-04-02 NOTE — Discharge Summary (Signed)
NAMEJASMENE, GOSWAMI          ACCOUNT NO.:  [id number]   MEDICAL RECORD NO.:  [id number]          PATIENT TYPE:  INP   LOCATION:  5501                         FACILITY:  [facility]   PHYSICIAN:  Kela Millin, M.D.DATE OF BIRTH:  20-Jan-1923   DATE OF ADMISSION:  11/25/2008  DATE OF DISCHARGE:  11/28/2008                               DISCHARGE SUMMARY   DISCHARGE DIAGNOSES:  1. Urinary tract infection/Candiduria.  2. Chronic obstructive pulmonary disease with acute bronchitis.  3. Nonischemic cardiomyopathy - last ejection fraction 15-20% per      catheterization in September 2009, followed by Dr. Eden Emms.  4. Hypokalemia - potassium replaced.  5. Hypertension.  6. Gastroesophageal reflux disease.  7. Diabetes mellitus.  8. History of hepatic and renal cysts.  9. History of hyperlipidemia.  10.History of chronic left bundle branch block.   PROCEDURES/STUDIES:  1. CT scan of abdomen and pelvis - no demonstrated acute abdominal      findings.  Hepatic and renal cysts appear stable.  Cholelithiasis.  2. Abdominal ultrasound - Gallstones and sludge, no thickening of the      gallbladder wall or sonographic Murphy's signs.  Normal common bile      duct, stable right hepatic lobe cyst and renal cyst again noted.   BRIEF HISTORY:  The patient is an 77 year old white female with the  above-listed medical problems who presented with complaints of abdominal  pain and fevers.  She reported that she had epigastric pain radiating to  her back, between her shoulders.  She also reported nausea, but no  vomiting, prior to admission.  The patient also complained of vague  lower abdominal pain.  She stated that she had a nonproductive cough for  3-4 days.  She was initially seen in the Urgent Care and asked to come  to the ER.  In the ER, a CT scan of her abdomen and pelvis was done and  the results as stated above.  A urinalysis was done as well and it  showed 21-50 WBCs with many bacteria  and large leukocyte esterase.  She  had an ultrasound of her abdomen done in the ER as well and it showed  gallstones and sludge but no thickening of the gallbladder wall.   Please see the full admission history and physical for the details of  the admission physical exam as well as the laboratory data.   HOSPITAL COURSE:  Problem #1:  Urinary tract infection/Candiduria - As  discussed above, the patient had a urinalysis upon admission which was  consistent with a UTI, and her white cell count was elevated at 16.  Urine cultures were obtained and she was empirically started on  antibiotics.  With this, she remained afebrile, her leukocytosis  resolved, her last white cell count prior to discharge is 4.8.  The  patient's nausea and vomiting also resolved.  She was she was started on  a clear liquid diet which was advanced as she tolerated and she is  taking solids at this time.  The patient's urine cultures came back  showing yeast greater than 45,000 colonies.  The patient received the  antibiotics times 3 days while in the hospital.  She will be discharged  home on Diflucan for the Candiduria.  Problem #2:  Abdominal pain - As discussed above, the patient was  admitted with epigastric pain radiating to between her shoulder blades,  as well as some vague lower abdominal pain.  The impression was that the  lower abdominal pain was likely secondary to #1 above.  She does have  the known gallstones as well as gastroesophageal reflux disease and the  impression was that this contributed to her upper abdominal pain.  I  discussed all of this with the patient and she has been followed by Dr.  Ezzard Standing outpatient for her gallstones.  She tells me that at her last  visit she was asked to go back to see her cardiologist before a final  decision is made.  She prefers at this time to follow up with Dr. Ezzard Standing  outpatient for a final decision on the possible cholecystectomy.  Problem #3:  Chronic  obstructive pulmonary disease with acute bronchitis  - The patient reported complaints of cough and chest x-ray was  consistent with bronchitis.  She was treated with Mucinex and  bronchodilators with improvement of her symptoms.  I will discharge her  on Advair and p.r.n. albuterol along with the Mucinex and she is to  follow up with her primary care physician.  Problem #4:  Hypokalemia - Her potassium was replaced in the hospital.  Problem #5:  Nonischemic cardiomyopathy - Remained stable during this  hospital stay - she was maintained on her outpatient medications and is  to continue this on admission and follow up with Dr. Eden Emms.  She tells  me that she has an appointment scheduled with him in March and I have  recommended that she schedule an earlier appointment, following her  discharge, so that she can see Dr. Ezzard Standing after that, and she agrees  that she will do this.  Problem #6:  Hypertension - She is to continue her outpatient  medications upon discharge.  Problem #7:  Diabetes mellitus - Diet-controlled, she is to follow up  with her primary care physician.   DISCHARGE MEDICATIONS:  1. Prilosec changed to 40 mg daily.  2. Diflucan 100 mg p.o. daily x4 more days.  3. Advair 250/50 1 puff twice daily.  4. Albuterol MDI 2 puffs q.4-6 h p.r.n.  5. Continue carvedilol 25 mg p.o. twice daily, benazepril 20 mg p.o.      daily, Lipitor 20 mg p.o. daily.  6. Lorazepam 0.5 mg at bedtime.  7. Zoloft 25 mg daily.  8. Lasix 20 mg as previously.  9. KCl 20 mEq as previously.  10.Fish Oil 1200 mg p.o. daily.  11.Vitamin D3.  12.Aspirin 81 mg p.o. daily.  13.Benadryl p.r.n.   FOLLOW-UP CARE:  1. Dr. Arvilla Market in 1 week, call for appointment.  2. Dr. Eden Emms - Call for appointment upon discharge, for the next      week.  3. Dr. Ezzard Standing, patient to call for appointment.   DISCHARGE CONDITION:  Improved/stable.      Kela Millin, M.D.  Electronically Signed     ACV/MEDQ   D:  11/28/2008  T:  11/28/2008  Job:  [id number]   cc:   Donia Guiles, M.D.  [name]. Eden Emms, MD, [facility]  Sandria Bales. Ezzard Standing, M.D.

## 2011-04-02 NOTE — Cardiovascular Report (Signed)
NAME:  [name], [name] NO.:  [id number]   MEDICAL RECORD NO.:  [id number]          PATIENT TYPE:  INP   LOCATION:  4730                         FACILITY:  [facility]   PHYSICIAN:  Verne Carrow, MDDATE OF BIRTH:  1923/09/02   DATE OF PROCEDURE:  07/29/2008  DATE OF DISCHARGE:                            CARDIAC CATHETERIZATION   INDICATIONS:  Chest pain in an 77 year old female with a history of  diabetes mellitus, hypertension, hyperlipidemia, and a nonischemic  cardiomyopathy with normal coronary arteries by heart catheterization in  2002.   OPERATOR:  Verne Carrow, MD   PROCEDURES PERFORMED:  1. Left heart catheterization  2. Selective coronary angiography.  3. Left ventricular angiogram.  4. Right heart catheterization.   PROCEDURE IN DETAIL:  The patient was brought to the catheterization  laboratory after signing informed consent.  The right groin was prepped  and draped in sterile fashion.  A 5-French sheath was inserted into the  right femoral artery.  A 7-French sheath was inserted into the right  femoral vein.  A Swan-Ganz catheter was used to perform a right heart  catheterization.  Saturations were taken from the pulmonary artery.  Pressures were measured in the right atrium, right ventricle, pulmonary  artery, and in a wedge position.  At this point, standard Judkins  catheters were used to perform angiograms of the left coronary system  and the right coronary artery.  A 5-French pigtail catheter was used to  cross the aortic valve into the left ventricle.  A left ventricular  angiogram was performed, and the catheter was pulled back across the  aortic valve.  The patient tolerated the procedure well and was taken to  recovery in stable condition.  The patient requested not to receive any  sedating medicine during the case.   HEMODYNAMIC FINDINGS:  Right atrial pressure 7/6, right ventricular  pressure 27/2.  Pulmonary artery pressure  26/18.  Pulmonary capillary  wedge pressure mean of 13.  Aortic pressure 161/67.  Left ventricular  pressure 164/14, end-diastolic pressure 18, pulmonary artery saturation  66%.  Aortic saturation 94%.  Cardiac output 4.98 (by Fick).  Cardiac  index 2.39.   ANGIOGRAPHIC FINDINGS:  1. The left main coronary artery bifurcates into the circumflex and      LAD.  The left main has no evidence of disease.  2. The left anterior descending is a large vessel that courses to the      apex and has only luminal irregularities noted in the proximal      portion.  The LAD gives off 2 large diagonal branches that are free      of disease.  3. The circumflex artery has no evidence of disease.  4. The right coronary artery is a large dominant vessel that has no      angiographic evidence of disease.   The left ventricular angiogram showed global hypokinesis with an  estimated ejection fraction of 15-20%.   IMPRESSION:  1. No significant coronary artery disease.  2. Severe left ventricular dysfunction consistent with a nonischemic      cardiomyopathy.   RECOMMENDATIONS:  Continue medical management of  nonischemic  cardiomyopathy.  The patient will be discharged to home later today  after her mandatory bedrest following sheath removal.      Verne Carrow, MD  Electronically Signed     CM/MEDQ  D:  07/29/2008  T:  07/29/2008  Job:  [id number]   cc:   [name]. Eden Emms, MD, [facility]

## 2011-04-02 NOTE — Discharge Summary (Signed)
NAMEKASUMI, DITULLIO NO.:  [id number]   MEDICAL RECORD NO.:  [id number]          PATIENT TYPE:  INP   LOCATION:  2041                         FACILITY:  [facility]   PHYSICIAN:  Hollice Espy, M.D.DATE OF BIRTH:  Dec 05, 1922   DATE OF ADMISSION:  03/16/2009  DATE OF DISCHARGE:  03/21/2009                               DISCHARGE SUMMARY   PRIMARY CARE PHYSICIAN:  Donia Guiles, MD   CARDIOLOGIST:  [name]. Eden Emms, MD, [facility]   DISCHARGE DIAGNOSES:  1. Bronchitis.  2. Viral gastroenteritis causing nausea, vomiting, diarrhea.  3. Weakness.  4. Chronic systolic congestive heart failure with an ejection fraction      of 15-20%.  5. Mild volume overload.  The patient asymptomatic.  6. History of nonischemic cardiomyopathy.  7. History of chronic obstructive pulmonary disease.  8. History of hypertension.  9. History of arthritis.  10.History of hyperlipidemia.   Discharge medications for this patient as follows.  Avelox 400 mg p.o.  daily x3 days.  The patient will also increase her Lasix from 20 mg  daily to 20 mg p.o. b.i.d. x3 days then return back to 20 mg daily.  She  will continue to rest of her medications:  1. Coreg 25 p.o. b.i.d.  2. Benazepril 20 p.o. daily.  3. Lipitor 20 p.o. daily.  4. Aspirin 81 p.o. daily.  5. Prilosec 20 p.o. daily.  6. Vitamin D p.o. daily.  7. Potassium 20 mEq p.o. daily.  8. Ativan 0.5 p.o. nightly p.r.n.  9. Zoloft 25 mg p.o. daily.  10.Fish oil p.o. daily.  11.P.r.n. Mucinex.  12.P.r.n. Tylenol.  13.P.r.n. Benadryl.   HOSPITAL COURSE:  The patient is a very pleasant 77 year old white  female with past medical history of ischemic cardiomyopathy with an EF  of 15-20%, who presented with increased shortness of breath.  Her BNP 1  day prior to admission was actually only around 104.  Her chest x-ray  noted no findings of any fusion.  Her D-dimer was elevated, but a CT  scan of the chest ruled out any type of PE.  It was  suspected that she  had a minimally elevated white count of 10.1 but with a shift suspect  that she had some bronchitis.  She was having some shortness of breath  and coughing with this.  She was also complaining of feeling very weak,  having problems with loose stool.  Stools were sent for C. diff and  found to be negative.  The patient was hydrated.  Stool cultures were  checked and found to be negative for a C. diff and this was felt to be  more of a viral gastroenteritis.  She was given very gentle hydration as  her BUN and creatinine has started to rise.  In the meantime, she was  given supportive cough medication and started on IV antibiotics for her  bronchitis given her history of COPD.  She remained stable in regards to  her bronchitis her symptoms improved.  Chest x-ray again confirmed no  signs of pneumonia and she will complete a 7-day course of antibiotics  at home.  In regards to her viral gastroenteritis, her diarrhea  resolved.  Her eating had improved.  In regards to her CHF, she received  some gentle IV fluids.  Her BNP as of Mar 20, 2009, was 530.  She was  asymptomatic from this and breathing comfortably on room air.  Her Lasix  was restarted after her diarrhea had resolved and she was put on double  dose 20 mg p.o. b.i.d., she tolerated this well, has continue to  diurese.  The plan will be for her to continue Lasix x3 days and then go  back to 20 mg p.o. daily and then follow up with her cardiologist, Dr.  Eden Emms, as scheduled.  Rest of her medical issues were stable during  this hospitalization, she will continue the rest of her medicines.  Her  disposition is improved.  Activity will be slow to increase.  She was  able to ambulate to the bathroom and in the hall with her walker as per  her baseline.  She will follow up with Dr. Donia Guiles in next 7-10  days.  Her discharge diet is a heart-healthy diet, and she is being  discharged to home.       Hollice Espy, M.D.  Electronically Signed     SKK/MEDQ  D:  03/21/2009  T:  03/22/2009  Job:  [id number]   cc:   [name]. Eden Emms, MD, [facility]  Donia Guiles, M.D.

## 2011-04-02 NOTE — Assessment & Plan Note (Signed)
[facility]                            CARDIOLOGY OFFICE NOTE   [name], [name] [name]                 MRN:          [id number]  DATE:05/06/2007                            DOB:          05-Jun-1923    [name] returns today for followup.  She has had nonischemic  cardiomyopathy with an EF of 35-40%.  She has had some chronic  exertional dyspnea.  She appears to be euvolemic.  Her weight has been  about 203 pounds over the last 2 years.  She continues to complain of  lower extremity edema.  This tends to be taken of Furosemide.  She also  has chronic dizziness which is not thought to be related to her heart.  She has some gait instability as well.  These have been chronic and  continue.  Her lightheadedness did not improve off Atacand, and she has  not been postural.   We need to recheck a BNP and a BMET to make sure that her fluid status  is okay.   From a cardiac perspective, she has not had any syncope.  There have  been no palpitations, no chest pain.  Her lower extremity edema is  stable.  She does tend to watch the salt in her diet.   As indicated, she weighs herself at home, and she has been pretty stable  at 203 pounds.   REVIEW OF SYSTEMS:  Primarily remarkable for the chronic dizziness and  gait instability.  Otherwise, negative.   Her medications include:  1. Glyburide 1 mg daily.  2. Atacand 4 daily.  3. Furosemide 20 daily.  4. Potassium 20 daily.  5. Aspirin daily.  6. Coreg 12.5 b.i.d.  7. Prilosec 20 daily.  8. Lipitor 40 daily.   PHYSICAL EXAMINATION:  GENERAL:  She is a healthy-appearing, elderly,  white female in no distress.  VITAL SIGNS:  The weigh is 203 pounds.  Respiratory rate is 14.  Blood  pressure is 130/70.  Pulse is 68 and regular.  She is afebrile.  HEENT:  Normal.  NECK:  Carotids normal without bruit.  There is no JVP elevation or  lymphadenopathy nor thyromegaly.  LUNGS:  Clear with good  diaphragmatic motion, no wheezing.  HEART:  There is an S1 and S2 with a soft systolic murmur.  PMI is  normal.  ABDOMEN:  Benign.  There are positive bowel sounds.  No tenderness, no  AAA, no hepatosplenomegaly.  Her hepatojugular reflux pulses are intact  with trace edema bilaterally.  NEUROLOGIC:  Nonfocal.  There is no muscular weakness.   IMPRESSION:  1. History of nonischemic cardiomyopathy currently stable, functional      class II, appears euvolemic, but we will check BMET and a BNP.      Refill on her Furosemide was given.  2. Hypertension.  Had been suboptimally controlled when she stopped      her Atacand and since this did not help her lightheadedness, we      have resumed it, and her blood pressure is now good.  She will      continue a low salt diet.  3. Lower extremity edema currently stable on her current dose of      Furosemide 20 mg daily.  Continue potassium supplementation.  BMET      to be checked today.  4. Hyperlipidemia.  Last LDL cholesterol was under 161.  She will      continue her Lipitor 40 daily.  5. Diabetes.  Continue Glyburide.  Follow up with primary care medical      doctor for follow up of hemoglobin A1c on a quarterly basis.   Overall, I think [name] looks well, and we will see her back in about  6 months unless her lab work is abnormal.     Theron Arista C. Eden Emms, MD, [facility]  Electronically Signed    PCN/MedQ  DD: 05/06/2007  DT: 05/06/2007  Job #: [id number]

## 2011-04-02 NOTE — Op Note (Signed)
[name], [name] NO.:  [id number]   MEDICAL RECORD NO.:  [id number]          PATIENT TYPE:  OIB   LOCATION:  2924                         FACILITY:  [facility]   PHYSICIAN:  Sandria Bales. Ezzard Standing, M.D.  DATE OF BIRTH:  [birth year]   DATE OF PROCEDURE:  01/13/2009  DATE OF DISCHARGE:                               OPERATIVE REPORT   Date of Surgery ?   PREOPERATIVE DIAGNOSIS:  Chronic cholecystitis and cholelithiasis.   POSTOPERATIVE DIAGNOSIS:  Chronic cholecystitis and cholelithiasis   PROCEDURE:  Laparoscopic cholecystectomy with intraoperative  cholangiogram.   SURGEON:  Ovidio Kin, MD   FIRST ASSISTANT:  None.   ANESTHESIA:  General endotracheal.   ESTIMATED BLOOD LOSS:  Minimal.   INDICATIONS FOR PROCEDURE:  [name] is an 77 year old white female  who has had some symptoms of gallbladder disease.  She also has  significant morbidity including a nonischemic cardiomyopathy with an  ejection fraction of 15-20%, followed by Dr. [name].   I discussed with the patient about proceeding with gallbladder surgery,  discussed indications and potential complications.  Potential  complication includes but not limited to bleeding, infection, bile duct  injury, and possibility of open surgery.  Also of note, she has a right  inguinal hernia but in speaking with Dr. Eden Emms, he was concerned about  the length of surgery and doing both operations and since her  gallbladder is symptomatic and the hernia is not, we will leave that  alone today.   OPERATIVE NOTE:  The patient was placed in a supine position, given  endotracheal anesthetic as supervised by Dr. Laverle Hobby.  She had A-  line placed.  Her abdomen was prepped with Techni-Care substitutes and  sterilely dressed.  A time-out was held identifying the patient and  procedure.   The patient did have some episodes of hypotension in the procedure which  were mainly rhythm related but most of the  operation, she did well.   I accessed the abdominal cavity through an infraumbilical incision with  sharp dissection carried out into the abdominal cavity.  A 0-degree 10-  mm laparoscope was inserted through a 12-mm Hasson trocar and this was  secured with a 0 Vicryl suture.  Right and left lobes of liver  unremarkable.  Stomach was unremarkable except for a small what looked  like kind of a hemangioma  or bleb on the greater curvature of stomach.  The rest of the bowel was covered with omentum and could not see most  bowel.  I saw no other masses or nodules.  I then turned my attention to  the gallbladder.  It did have some adhesions along the anterior  gallbladder wall.  I dissected these off, rotated the gallbladder  cephalad, dissected down to the gallbladder cystic duct junction.   I did do an intraoperative cholangiogram.  The intraoperative  cholangiogram was shot using cutoff taut catheter, inserted through a 14  gauge Jelco into the side of the cut cystic duct and secured with an  EndoClip.  I used about 10 mL of half-strength Hypaque solution to  inject under fluoroscopy, it  showed free flow of contrast down the  cystic duct up the hepatic radicals, entered through the common bile  duct.  There was no filling defect.  No masses.  It appeared to be a  normal intraoperative cholangiogram.   The Taut catheter was then removed.  The cystic duct triply endoclipped.  The cystic artery was triply endoclipped.  I then sharply and bluntly  dissected the gallbladder from the gallbladder bed.  Prior to complete  division of the gallbladder from the gallbladder bed and I re-visualized  of the triangle of Calot, I re-visualized the gallbladder bed, there  were no bleeding, no bile leak.  The gallbladder was then divided and  placed in an Endocatch bag and delivered through the umbilicus.   Each umbilical port was then removed.  The umbilical port was closed  with 0 Vicryl suture for the  fascial level and 5-0 Vicryl suture at the  skin level.  Each wound was painted with a tincture of benzoin and Steri-  Strips.  The patient tolerated the procedure well and was transported to  the recovery room in good condition.  Sponge and needle counts were  correct at the end of the case.      Sandria Bales. Ezzard Standing, M.D.  Electronically Signed     DHN/MEDQ  D:  01/13/2009  T:  01/14/2009  Job:  [id number]   cc:   Donia Guiles, M.D.  [name]. Eden Emms, MD, [facility]

## 2011-04-02 NOTE — Discharge Summary (Signed)
[name], RIVA NO.:  [id number]   MEDICAL RECORD NO.:  [id number]          PATIENT TYPE:  INP   LOCATION:  4730                         FACILITY:  [facility]   PHYSICIAN:  [name]. Eden Emms, MD, FACCDATE OF BIRTH:  February 13, 1923   DATE OF ADMISSION:  07/28/2008  DATE OF DISCHARGE:  07/29/2008                         DISCHARGE SUMMARY - REFERRING   DIAGNOSES:  1. Dyspnea on exertion.  2. Atypical chest discomfort.  3. Hypokalemia.  4. Chronic left bundle branch block.  5. Hypertension.  6. Known nonischemic cardiomyopathy.  7. History as listed below.   PROCEDURES PERFORMED:  Right and left cardiac catheterization July 29, 2008.   HISTORY:  [name] is an 77 year old white female who was seen in  the office on July 28, 2008 by Dr. Eden Emms.  She was complaining of  increased exertional shortness of breath.  Given her history of  nonischemic cardiomyopathy, left bundle branch block, known EF of 15-  20%, Dr. Eden Emms admitted her for cardiac catheterization and further  evaluation.   PAST MEDICAL HISTORY:  1. Also notable for diabetes.  2. Hyperlipidemia.  3. Hypertension.  4. PAF.   LABORATORY DATA:  Admission weight was 88.6 kg.  On July 29, 2008,  88.7 kg.  Admission H&H was 14.6 and 42.7.  Normal indices.  Platelets  192, WBC 9.2, PT 13.8, sodium 141, potassium 3.3, BUN 14, creatinine  1.05, glucose 127.  Normal LFTs.  Troponins were 0.01 and 0.01.  On  July 29, 2008, potassium 3.3 and she was supplemented with 40 mEq.   HOSPITAL COURSE:  The patient was admitted to 69.  Overnight, she did  not have any difficulty.  She ruled out for recent myocardial  infarction.  She received potassium supplementation on the morning of  July 29, 2008.  A right and left heart catheterization was  performed by Dr. Clifton James.  Pressures were essentially unremarkable.  She did not have any coronary artery disease.  EF was 15-20%.  Findings  were reviewed with Dr. Eden Emms.  He felt that she could be discharged  home with further treatment as an outpatient.   DISPOSITION:  [name] is discharged home on July 29, 2008.   DISCHARGE INSTRUCTIONS:  1. She is asked to maintain a low-sodium, heart-healthy diet, ADA      diet.  2. Wound care and activities are based on post-catheterization      supplemental sheet.  3. She was asked to bring all daily weights and to bring all      medications to all follow up appointments.   MEDICATIONS:  Her medications remain unchanged from pre-admission.  These include:  1. Coreg 25 mg b.i.d.  2. Benazepril 20 mg daily.  3. Glimepiride 1 mg daily.  4. Lipitor 40 mg q.h.s.  5. Prilosec over-the-counter 20 mg daily.  6. Lorazepam 0.5 mg q.h.s.  7. Unasyn 50 mg 2 tabs q.h.s.  8. Fish oil 1200 mg daily.  9. Lasix 20 mg.  10.K-Dur 20 mEq as needed.  11.Benadryl as previously.  12.Aspirin 81 mg daily.   DISCHARGE TIME:  35 minutes.  Joellyn Rued, PA-C      [name]. Eden Emms, MD, [facility]  Electronically Signed    EW/MEDQ  D:  07/29/2008  T:  07/29/2008  Job:  [id number]   cc:   [name]. Eden Emms, MD, [facility]

## 2011-04-02 NOTE — Assessment & Plan Note (Signed)
[facility]                            CARDIOLOGY OFFICE NOTE   [name], [name] [name]                 MRN:          [id number]  DATE:09/02/2007                            DOB:          04/11/1923    Jadynn returns today for followup.  I have seen her in the past for  multiple issues, including nonischemic cardiomyopathy with an EF of 35-  40%.  History of paroxysmal atrial fibrillation, hypertension, lower  extremity edema, palpitations, and exertional dyspnea.   [name] continues to struggle with multiple symptoms.   She has always felt very unsteady on her feet.   This continues.  I suspect this is more related to her arthritis and her  age.  However, she has had some increased palpitations.   Saw her primary care doctor who wanted to increase both her Coreg and  her Atacand.  I told [name] I was a little bit concerned about  increasing both of them at the same time.  However, I do think that she  would probably benefit from increasing her Coreg, which we will try  first.  She has been on Coumadin in the past for PAF, but she has been  in sinus rhythm when most recently seen.  She was recently in the  hospital for an exacerbation of her blood pressure.  She ruled out for  myocardial infarction.   I do not have the discharge summary from that time, and I am not sure  what adjustments in meds were made.   She had a nice blood pressure chart at home, and in general, her blood  pressures have been running 140 to 156 systolic, so I suspect she can  tolerate a little bit more medicine.   She does not have documented coronary artery disease.  She had a Myoview  study, I believe in 2007, which showed a left bundle branch block  artifact and no ischemia.   Her last echo was done also in 2007 with an EF of 35-45%.   REVIEW OF SYSTEMS:  Otherwise, negative.   MEDICATIONS:  1. Glimepiride 1 mg a day.  2. Atacand 4 a day.  3.  Furosemide 20 a day.  4. Potassium 20 a day.  5. An aspirin a day.  6. Coreg to be increased to 15 b.i.d.  7. Prilosec over-the-counter.  8. Lipitor 40 a day.   EXAM:  Remarkable for a healthy-appearing elderly white female in no  distress.  Affect is appropriate.  She actually looks younger than her  stated age.  Weight is 208, blood pressure 130/70, pulse 83 with occasional PVCs.  HEENT:  Unremarkable.  Carotid are normal without bruit.  There is no lymphadenopathy.  No  thyromegaly.  No JVP elevation.  LUNGS:  Clear.  Good diaphragmatic motion.  No wheezing.  There is an  S1, S2 with distant heart sounds.  PMI is not palpable.  Bowel sounds are positive.  No AAA.  No tenderness.  No  hepatosplenomegaly or hepatojugular reflux.  Distal pulses are intact with trace edema.  NEURO:  Remarkable for possible neuropathy in the left greater than  right leg from her diabetes.  There is no muscular weakness.   EKG:  Shows sinus rhythm with PVCs and PACs.  Left bundle branch block.   IMPRESSION:  1. Presumed nonischemic cardiomyopathy, appears euvolemic.  Baseline      activity level with mild chronic exertional dyspnea.  Continue      current dose of diuretic and Atacand.  2. History of paroxysmal atrial fibrillation.  She does not clearly      have symptoms of recurrence, but seems to have frequent premature      ventricular contractions and premature atrial contractions.  We      will have to watch this.  She is unsteady enough on her feet where      I am not sure I would put her back on Coumadin anyway.  We will see      how her rhythm stabilizes out with a higher dose of Coreg.  3. Hypertension, recently hospitalized for exacerbation.  Increase      Coreg.  I will see her back in 8 to 10 weeks, and at that time we      will consider increasing her Atacand to 8 mg a day.  I prefer not      to do both changes at the same time.  4. Lower extremity edema, currently stable.  Continue  Lasix 20 a day.      Low salt diet.  5. History of hyperlipidemia.  Continue Lipitor 40 mg daily.  Follow      up lipid and liver profile in 6 months.  6. Diabetes.  Continue glyburide 1 mg a day.  Hemoglobin A1c per Dr.      Arvilla Market.   Overall, I think that [name] is doing okay for her age.  I will see  her back in 8 to 10 weeks to reassess her pulse and blood pressure.     [name]. Eden Emms, MD, [facility]  Electronically Signed    PCN/MedQ  DD: 09/02/2007  DT: 09/03/2007  Job #: [id number]

## 2011-04-02 NOTE — H&P (Signed)
[name], [name]          ACCOUNT NO.:  [id number]   MEDICAL RECORD NO.:  [id number]          PATIENT TYPE:  INP   LOCATION:  6732                         FACILITY:  [facility]   PHYSICIAN:  Corinna L. Lendell Caprice, MDDATE OF BIRTH:  [birth year]   DATE OF ADMISSION:  10/15/2008  DATE OF DISCHARGE:                              HISTORY & PHYSICAL   CHIEF COMPLAINT:  Vomiting.   HISTORY OF PRESENT ILLNESS:  [name] is a pleasant 77 year old  white female with multiple medical problems who presents to the  emergency room with intractable vomiting.  She is also having upper  abdominal pain, a dry cough, and pleuritic chest pain.  She denies  fevers or chills.  She has had no diarrhea.  Her abdominal pain is  mainly when she coughs.  She has had no sick contacts that she knows of.  No rash.  She did have pain on the left side of her teeth in the  maxillary area and is having a lot of rhinorrhea.  She has had no  hematemesis.  She feels better now in the emergency room, but slightly  dizzy and was found to be hypotensive and has received a liter and a  half of IV fluids.  She also has nonischemic cardiomyopathy with an  ejection fraction of 20%.   PAST MEDICAL HISTORY:  1. Nonischemic cardiomyopathy, EF 20%.  2. Diabetes, not currently on medications.  3. Cholelithiasis, scheduled for outpatient evaluation with Dr. Ezzard Standing      according to previous discharge summary.  4. COPD.  5. Hypertension.  6. Gastroesophageal reflux disease.  7. Hyperlipidemia.   MEDICATIONS:  1. Carvedilol 25 mg p.o. b.i.d.  2. Benazepril 20 mg a day.  3. Lipitor 20 mg a day.  4. Aspirin 81 mg a day.  5. Prilosec 20 mg a day.  6. Vitamin D I think daily.  7. Lasix 20 mg a day.  8. Potassium chloride 20 mEq a day.  9. Fish oil capsules 1000 mg a day.  10.Benadryl as needed.   SOCIAL HISTORY:  Reviewed and as her previous.   FAMILY HISTORY:  Noncontributory.   REVIEW OF SYSTEMS:  As above,  otherwise negative.   PHYSICAL EXAMINATION:  VITAL SIGNS:  Temperature is initially 98.1, but  subsequently is 99.9, blood pressure 133/81 on arrival to ER and has  dropped to 89/90 systolic since then, heart rate 99, respiratory rate  initially 28, currently 18, and oxygen saturation 95% on room air.  GENERAL:  The patient is a comfortable-appearing white female with an  occasional cough.  HEENT:  Normocephalic and atraumatic.  Pupils equal, round, and reactive  to light.  Slightly dry mucous membranes.  Oropharynx is without  erythema or exudate.  She has no sinus tenderness.  NECK:  Supple.  She has some shotty submandibular lymphadenopathy.  No  JVD.  LUNGS:  Clear to auscultation bilaterally without wheezes, rhonchi, or  rales.  CARDIOVASCULAR:  Regular rate and rhythm without murmurs, gallops, or  rubs.  ABDOMEN:  Normal bowel sounds, soft, nontender, and nondistended.  GU AND RECTAL:  Deferred.  EXTREMITIES: No clubbing,  cyanosis, or edema.  SKIN:  No rash.  PSYCHIATRIC:  Normal affect.  NEUROLOGIC:  Alert and oriented.  Cranial nerves and sensorimotor exam  are intact.   LABORATORY DATA:  White blood cell count is 22,000 with 96% neutrophils  and 2% lymphocytes.  The rest of her CBC is unremarkable.  D-dimer is  6.45.  Point-of-care enzymes negative.  Complete metabolic panel is  significant for glucose of 168, otherwise essentially unremarkable.  Lipase 11.  Urinalysis shows a specific gravity of greater than 1.046,  small bilirubin, 15 ketones, negative nitrite, negative leukocyte  esterase, 30 protein, negative blood, few squamous epithelial cells, few  bacteria.  Two views of the chest show chronic interstitial changes with  small pleural effusions.  CT angiogram of the chest shows no PE or  aortic dissection, coronary and aortic calcifications, borderline  mediastinal adenopathy, nonspecific tiny bilateral pleural effusions.  EKG shows normal sinus rhythm with  first-degree AV block and left bundle-  branch block.   ASSESSMENT AND PLAN:  1. Vomiting in the setting of leukocytosis and relative hypotension:      The patient will be admitted for further workup and serial      abdominal exams.  As her white count is elevated and she is      slightly hypotensive, I will put her on empiric Cipro and Flagyl.      Her clinical picture otherwise looks more viral in nature.  2. History of systolic dysfunction:  The patient has already received      much IV fluids and I will just put her on KVO for now and watch for      signs of congestive heart failure.  Currently, she appears      compensated.  3. Diabetes, currently not on any medications.  I will check a      hemoglobin A1c and monitor blood glucose.  4. Left bundle-branch block, old.  5. Stable chronic obstructive pulmonary disease.  6. Gastroesophageal reflux disease.  7. Hypertension.  8. Cholelithiasis.  Also she got a right upper quadrant ultrasound      that showed gallstones and no evidence of acute cholecystitis.  She      also has no right upper quadrant tenderness.  9. Acute maxillary sinusitis.  She will get Afrin in addition to the      empiric antibiotics.      Corinna L. Lendell Caprice, MD  Electronically Signed     CLS/MEDQ  D:  10/16/2008  T:  10/16/2008  Job:  [id number]   cc:   Donia Guiles, M.D.  [name]. Eden Emms, MD, [facility]

## 2011-04-02 NOTE — Consult Note (Signed)
NAME:  [name], [name] NO.:  [id number]   MEDICAL RECORD NO.:  [id number]          PATIENT TYPE:  OBV   LOCATION:  6705                         FACILITY:  [facility]   PHYSICIAN:  Luis Abed, MD, FACCDATE OF BIRTH:  [birth year]   DATE OF CONSULTATION:  DATE OF DISCHARGE:                                 CONSULTATION   [name] was admitted on August 04, 2007 to [facility].  She has a nonischemic cardiomyopathy with an ejection fraction in the 35-  40% range.  She saw Dr. Eden Emms last in the office on May 06, 2007.  At  that time, she was stable.  Recently, she has had some variation in her  blood pressure and on the day of admission, her pressure was in the  range of 170s systolic.  She then felt a fleeting sensation in her chest  and her arm, and she took her pressure again, and the pressure was  higher with a systolic over 200.  She became concerned and came to the  emergency room.   At the hospital, she has stabilized.  Her blood pressure has come under  control.  Her enzymes have been negative.  She is stabilized.  We are  seeing her to see if any further cardiac evaluation is needed.   PAST MEDICAL HISTORY:  Other medical problems, see the complete list  below.   ALLERGIES:  See the chart for the allergy history.  Allergies include  PENICILLIN, SULFA, and TETANUS TOXIN.   MEDICATIONS:  1. As an outpatient, she was on Coreg 12.5 b.i.d.  2. Atacand 4 mg.  3. Lipitor 40.  4. Glimepiride 1.  5. Torsemide 20.  6. K-Dur 20.  7. Aspirin 81.  8. Advil.  9. Prilosec.  10.Fish oil.   SOCIAL HISTORY:  Patient does not smoke.   FAMILY HISTORY:  Noncontributory at this time.   REVIEW OF SYSTEMS:  This evening, she feels well.  She is doing quite  well.  She is not having any headache or eye problems.  She is not  having any significant GI or GU symptoms.  She has ambulated some in the  room.  She mentioned that over time, she has had 1-2  episodes of  falling.  She says that this is not syncope.  She has a bit of a balance  problem.  Otherwise, her review of systems is negative.   PHYSICAL EXAMINATION:  Blood pressure is 140/80 with a pulse of 70.  GENERAL:  Patient is oriented to person, time, and place.  Affect is  normal.  HEENT:  No xanthelasma.  She has normal extraocular motion.  NECK:  There are no carotid bruits.  There is no jugular venous  distention.  LUNGS:  Clear.  Respiratory effort is not labored.  CARDIAC:  An S1 with an S2.  There are no clicks.  There is a 2/6  systolic murmur.  ABDOMEN:  Soft.  There are no masses or bruits.  There are normal bowel  sounds.  The patient has no significant peripheral edema.   EKG revealed sinus rhythm with a  left bundle branch block.   Hemoglobin is 13.5, platelet count 190,000.  Sodium 144, potassium 3.8,  BUN 16, creatinine 0.9.  Troponins have been 0.04 and 0.04.  BNP level  has been in the range of 250.   Her telemetry has shown sinus rhythm.   PROBLEMS:  1. Nonischemic cardiomyopathy with an ejection fraction of 35-40%.  2. Chronic exertional dyspnea.  3. Mild intermittent peripheral edema.  She has none at this time.  4. Chronic dizziness with some gait instability.  5. Hypertension.  The control has varied.  Most recently she did have      increased blood pressure.  It is better controlled in the hospital.      She needs no further change in her meds, but she needs outpatient      followup.  6. Hyperlipidemia.  She is on Lipitor.  7. Diabetes.  She is on medications for this.  8. Left bundle branch block.  9. Presentation with some increased blood pressure and some fleeting      chest discomfort that was not cardiac.  She then had higher blood      pressure and came to the hospital.  She is stabilized, and there is      no evidence of an acute cardiac syndrome.   PLAN:  1. No further cardiac workup is needed in the hospital at this time.  2.  Continue to monitor through the evening, and if she is stable, she      can most probably be discharged home tomorrow.  Dr. Eden Emms will see      her on rounds tomorrow.  No other workup is needed at this time.      Luis Abed, MD, [facility]  Electronically Signed     JDK/MEDQ  D:  08/06/2007  T:  08/06/2007  Job:  [id number]   cc:   Donia Guiles, M.D.  [name]. Eden Emms, MD, [facility]

## 2011-04-02 NOTE — H&P (Signed)
NAMENEDDIE, STEEDMAN NO.:  [id number]   MEDICAL RECORD NO.:  [id number]          PATIENT TYPE:  INP   LOCATION:  3702                         FACILITY:  [facility]   PHYSICIAN:  Joylene John, MD       DATE OF BIRTH:  Nov 23, 1922   DATE OF ADMISSION:  08/27/2008  DATE OF DISCHARGE:                              HISTORY & PHYSICAL   REASON FOR ADMISSION:  Increasing shortness of breath and cough.   HISTORY OF PRESENT ILLNESS:  This is an 77 year old elderly white female  coming in with increasing cough and shortness of breath over the last  several days.  The patient was recently in the hospital for pneumonia.  According to the patient since her discharge, her cough has  progressively gotten worse along with worsening of her shortness of  breath.  Today morning when the patient woke up, she was unable to catch  her breath prompting her to come to the emergency room for further  medical care.  The patient has noticed that she has been having nausea  along with her symptoms.  The patient denies any fevers or chills.  Does  have chronic dizziness and lightheadedness.  The patient tells me that  her cough is white and frothy.  The patient denies any recent sick  contacts or travels.  The patient was seen by her cardiologist recently  to give her clearance for her gallbladder surgery.  The patient also has  chronic back pain, which has been bothering her since she has been  laying in the hospital bed.   The patient's past medical history is significant for severe COPD,  nonischemic cardiomyopathy, chronic left bundle-branch on EKG,  paroxysmal AFib, hypertension, GERD, hyperlipidemia, and diabetes.   FAMILY HISTORY:  Noncontributory.   SOCIAL HISTORY:  The patient lives alone.  Nonsmoker.  No alcohol or  drugs.   Allergies include PENICILLIN, which causes swelling and rash.  Sulfa  causes nausea and vomiting.   The patient's current medications include Combivent, PPI,  aspirin,  Lipitor, benazepril, and p.r.n. Lasix.   REVIEW OF SYSTEMS:  Please refer to the HPI, otherwise the 14 point  review of system was negative.   PHYSICAL EXAMINATION:  VITAL SIGNS:  Temperature of 97.1, pulse of 96,  respiration 18, blood pressure 134/78, O2 sat the patient is sating 95%  on room air.  GENERAL:  The patient is in no acute distress, lying in a semi-erect in  bed, flat on her back.  NECK:  Supple.  No JVD appreciated.  LUNGS:  On auscultation, there are decreased breath sounds bilateral  basally with scattered expiratory wheezes.  HEART:  Rhythm is regular rate and rhythm.  EXTREMITIES:  Lower extremity reveals trace edema.  NEUROLOGICAL:  Nonfocal.   Imaging shows a chest x-ray consistent with COPD changes and prominent  interstitial markings with small bilateral infusions.   Pertinent labs shows hemoglobin of 14.2, crit of 41.8, white count 5.5,  and platelets 139.  Sodium 143, potassium 3.2, chloride 110, bicarb 24,  BUN 9, creatinine 0.79, glucose 120, and calcium 8.8.   ASSESSMENT/PLAN:  An 77 year old  female coming in with increasing  shortness of breath and cough.  Plan is to put the patient in  respiratory isolation and rule out for H1N1 and regular flu.  Put her on  tele bed since the patient has extensive cardiac history and she did  have some nausea along with the shortness of breath and get serial  cardiac enzymes.  We will also give her home medications along with her  blood pressure medications with hold parameters.  For her shortness of  breath, plan is to continue her Combivent, give her p.r.n. nebs with  first dose now.  We will give her one dose of Lasix 40 mg since the  patient had some crackles on physical exam.  For her pneumonia, the  patient has been given in the ER ceftriaxone and azithromycin.  We will  add vancomycin for MRSA coverage.  We will continue with vancomycin and  levofloxacin to broaden her coverage.  We will get labs  and blood  cultures in the morning.  The patient does have some nausea, so we will  give Phenergan for nausea.  We will replete her potassium and check labs  in the morning.  Once the patient has been ruled out, she can be taken  off of a tele and once the patient's influenza status has been ruled  out, she can be taken out of respiratory isolation.  When the patient is  ready for discharge, she can be discharged on oral antibiotics.      Joylene John, MD  Electronically Signed     RP/MEDQ  D:  08/27/2008  T:  08/28/2008  Job:  [id number]

## 2011-04-02 NOTE — Discharge Summary (Signed)
[name], [name]          ACCOUNT NO.:  [id number]   MEDICAL RECORD NO.:  [id number]          PATIENT TYPE:  INP   LOCATION:  4734                         FACILITY:  [facility]   PHYSICIAN:  Kela Millin, M.D.DATE OF BIRTH:  06-Nov-1923   DATE OF ADMISSION:  08/10/2008  DATE OF DISCHARGE:  08/14/2008                               DISCHARGE SUMMARY   DISCHARGE DIAGNOSES:  1. Chronic obstructive pulmonary disease with acute bronchitis.  2. Cholelithiasis with elevated liver function tests--the patient is      to follow up with Dr. Ezzard Standing as directed for elective      cholecystectomy as directed.  3. Nonischemic cardiomyopathy--last cardiac catheterization on      July 29, 2008, per Dr. Eden Emms with ejection fraction 15% to      20%; the patient is to follow up in a.m. as scheduled and have      clearance for upcoming cholecystectomy as well.  4. Hypokalemia--resolved.  5. Hepatic cysts--outpatient followup.  6. History of paroxysmal atrial fibrillation.  7. Chronic left bundle-branch block.  8. Hypertension.  9. Diabetes mellitus with hypoglycemia--the patient is to hold off      Amaryl and to follow up with her primary care physician as      directed.  10.Gastroesophageal reflux disease.  11.History of hyperlipidemia.   PROCEDURES AND STUDIES:  1. CT angiogram of the chest--negative for pulmonary embolus,      adenopathy in the azygoesophageal recess, nonspecific.  PAH,      minimal pleural effusions, negative for pneumonia or edema.  2. CT scan of the abdomen and pelvis without contrast--no upper      abdominal abnormal fluid collection or inflammatory process.  Low      density lesions in the liver may be cysts, although small ones      cannot be adequately characterized.  Right renal cyst.      Atherosclerotic-type changes of the aorta.  Slight thickening of      the distal esophagus may represent mild esophagitis-type changes.      Sigmoid diverticulosis without  CT evidence of diverticulitis.  The      appendix is not visualized.  3. Abdominal ultrasound--cholelithiasis and gallbladder sludge.  Wall      thickness is at the upper limits of normal.  No sonographic      Murphy's sign.  Benign-appearing hepatic and renal cysts.  Small      pleural effusions.   CONSULTATIONS:  Surgery--Dr. Ezzard Standing.   BRIEF HISTORY:  The patient is an 77 year old white female with the  above-listed medical problems who presented with complaints of chest and  abdominal pain as well as fevers.  She also admitted to a cough  productive of whitish sputum in the couple of days prior to admission  associated with pleuritic chest pain.  She admitted to fevers up to 102  on the day of presentation.  She also reported a vague abdominal pain  that was worse with coughing, denied vomiting, melena, diarrhea,  hematochezia, no hemoptysis.  Her white cell count was noted to be  elevated at 17.6.  A D-dimer was  done, and it was elevated at 14.71.  A  CT angiogram was done, and it was negative for PE as stated above.  She  was admitted for further evaluation and management.   Please see the full admission history and physical for the details of  the admission, physical exam, as well as the laboratory data.   HOSPITAL COURSE:  1. Chronic obstructive pulmonary disease with acute bronchitis--the      patient was placed on bronchodilators as well as Mucinex upon      admission.  She subsequently was also started on Avelox, and her      symptoms improved.  The pleuritic pain has resolved, and she is      oxygenating well on room air.  She has remained hemodynamically      stable and will be discharged on oral antibiotics.  She is to      follow up with her primary care physician.  2. Cholelithiasis--as discussed above; the patient was complaining of      vague abdominal pain upon admission, and LFTs were found to be      elevated; right upper quadrant ultrasound was done, and it  revealed      cholelithiasis.  The patient also had transiently elevated amylase      which resolved while in the hospital.  Following the ultrasound      revealing gallstones, surgery was consulted, and Dr. Ezzard Standing saw the      patient.  At that point, the patient was much improved, tolerating      p.o. well, and given the patient's cardiomyopathy with an ejection      fraction of 15% to 20%, Dr. Ezzard Standing indicated that he would like to      have cardiology clearance done when the patient followed up with      the cardiologist as scheduled, and then she would have an elective      cholecystectomy as scheduled.  Again, her abdominal pain has      resolved, and she will be discharged home at this time with      outpatient followup.  3. Nonischemic cardiomyopathy--stable; the patient was maintained on      her outpatient medications during her hospital stay and is to      follow up with Dr. Eden Emms as discussed above.  4. Diabetes mellitus with hypoglycemic episodes--while in the      hospital, the patient initially had decreased p.o. intake and      subsequently had hypoglycemic episode.  Following this, her Amaryl      was stopped.  She was put on D5.  Subsequently, her symptoms      improved.  She is tolerating p.o. well; she has been directed to      keep a log of her blood sugars, and follow up with her primary care      physician as directed.  5. Hepatic cysts--as noted per ultrasound, reported to be benign per      radiology.  She is to follow up with her primary care physician.  6. Hypokalemia--her potassium was replaced during her hospital stay.   DISCHARGE MEDICATIONS:  1. Avelox 400 mg 1 p.o. daily.  2. Mucinex 1-2 tablets b.i.d.  3. Combivent 2 puffs q.i.d.  4. The patient to hold off Amaryl till follow up with her primary care      physician.  5. The patient to continue Coreg 25 b.i.d.  6. Prilosec 20 mg daily.  7. Aspirin 81 mg daily or as instructed per cardiology and Dr.  Ezzard Standing.  8. Lipitor 40 mg p.o. nightly.  9. Lorazepam 0.5 mg nightly p.r.n.  10.Benazepril 20 mg p.o. daily.  11.The patient to hold off p.r.n. Lasix until followup with      cardiology.   FOLLOWUP CARE:  1. Dr. Ezzard Standing as directed.  The patient to call for followup      appointment.  2. Dr. Eden Emms in a.m. as scheduled.  3. Dr. Arvilla Market this week.  The patient to call for appointment and to      take a log of her blood sugars for followup visit.   DISCHARGE CONDITION:  Improved/stable.      Kela Millin, M.D.  Electronically Signed     ACV/MEDQ  D:  08/14/2008  T:  08/14/2008  Job:  [id number]   cc:   Sandria Bales. Ezzard Standing, M.D.  [name]. Eden Emms, MD, [facility]  Donia Guiles, M.D.

## 2011-04-02 NOTE — Assessment & Plan Note (Signed)
[facility]                            CARDIOLOGY OFFICE NOTE   Maryrose, Colvin TIYE HUWE                 MRN:          [id number]  DATE:12/01/2008                            DOB:          1923/06/13    [name] returns today for followup at the request of Dr. Ezzard Standing.  She  needs preop clearance for laparoscopic cholecystectomy.  She now has had  2 bouts of cholecystitis.  She just had another bout this Thursday.  She  initially had about, I believe, in October with rigors and abdominal  pain.  She wanted to wait, not to have surgery at that time.  She does  have comorbidities including her age and nonischemic cardiomyopathy with  an EF 15-20%.  However, she again had fevers and rigors with back and  right upper quadrant pain.  She clearly needs to have her gallbladder  out.  I told Nyajah again if there is always concern of her age for  having general anesthesia, however, her cardiomyopathy is nonischemic.  She has not had active failure requiring hospitalization.  I think we  can get her through the surgery particularly since it is likely be  laparoscopic.   The patient understands the risks, however, I honestly think that the  risk-benefit ratio repeated episodes of cholecystitis and possibly  sepsis would be worse for the patient.   In regards to her heart, she did have coronary arteriography in 2002 and  had no significant coronary artery disease.   Her last Myoview in September 2007 showed an EF of 41% with a left  bundle-branch block and no significant ischemia.   PAST MEDICAL HISTORY:  1. COPD.  2. Hypertension.  3. Dilated cardiomyopathy.  4. Cholecystitis.  5. Arthritis.  6. Hypercholesterolemia.  7. Anxiety and depression.   Preslee had Nils Pyle with her today, who is her guardian and  power of attorney.  She is widowed.  She used to work at [employer].  She is getting around now more with a cane or a three-point  walker.  She  does not smoke or drink.  She is allergic to PENICILLIN, SULFA, and  TETANUS SHOTS.   CURRENT MEDICATIONS:  1. Fish oil.  2. Prilosec.  3. Potassium.  4. Vitamin D.  5. Aspirin.  6. Lipitor 40 a day.  7. Coreg 25 b.i.d.  8. Benazepril 20 a day.  9. Sertraline 25 a day.  10.Lasix 20 a day.   FAMILY HISTORY:  Noncontributory.   PHYSICAL EXAMINATION:  GENERAL:  Remarkable for a stately female in no  distress.  VITAL SIGNS:  Blood pressure 120/68, pulse 70 and regular, respiratory  rate 14, afebrile, and weight 180.  HEENT:  Unremarkable.  NECK:  Carotids are normal without bruit.  No lymphadenopathy,  thyromegaly, or JVP elevation.  LUNGS:  Clear with good diaphragmatic motion.  No wheezing.  CARDIAC:  S1 and S2 with an MR murmur.  PMI increased.  ABDOMEN:  Benign.  Bowel sounds positive.  No AAA, no tenderness, no  bruit, no hepatosplenomegaly or hepatojugular reflux.  EXTREMITIES:  Distal pulses are intact.  No edema.  NEUROLOGIC:  Nonfocal.  SKIN:  Warm and dry.  MUSCULOSKELETAL:  No muscular weakness.   Her EKG shows a left bundle-branch block, which is chronic.  I reviewed  her records from hospitalization in October.   IMPRESSION:  1. Nonischemic cardiomyopathy, currently functional, class I, and      stable.  Continue current dose of benazepril and diuretics.  2. Preoperative clearance.  The patient is cleared for surgery, her      biggest risk would appear to be her age.  Laparoscopic      cholecystectomy should be something that she can get through.  We      would like to be called to follow her at the time of her surgery      and postoperatively.  3. Left bundle-branch block with no evidence of high-grade heart block      or syncope.  Continue to monitor EKG.  4. Lower extremity edema, improved.  Continue current dose of Lasix.  5. Cholecystitis.  Follow up with Dr. Ezzard Standing to schedule up      laparoscopic cholecystectomy.  6. Previous history of  palpitations and paroxysmal atrial      fibrillation, maintaining sinus rhythm.  Continue baby aspirin and      current dose of Coreg.     [name]. Eden Emms, MD, [facility]  Electronically Signed    PCN/MedQ  DD: 12/01/2008  DT: 12/02/2008  Job #: [id number]   cc:   Sandria Bales. Ezzard Standing, M.D.

## 2011-04-02 NOTE — Assessment & Plan Note (Signed)
[facility]                            CARDIOLOGY OFFICE NOTE   Majesty, Stehlin ORVETTA DANIELSKI                 MRN:          [id number]  DATE:10/26/2007                            DOB:          09-Apr-1923    [name] returns today for follow up.  I have seen her in the past for  non-ischemic cardiomyopathy.  She had normal coronaries by cath in 2002.  She had a non-ischemic Myoview in 2007 with an EF of 41%.  Her EF has  been as low as 20% in 1998; however, both by echo and Myoview, she seems  to be running about 40% with a chronic left bundle branch block.  She  has chronic unsteadiness on her feet.  It is not really vertigo or  dizziness.  She does have some neuropathy in her legs.   When I last saw her, her blood pressures had been running high.  We  increased her Coreg to 25 b.i.d.  This seems to have helped.  The blood  pressure readings at home were excellent.   REVIEW OF SYSTEMS:  Remarkable for her chronic unsteadiness on her feet.  There has been no syncope.  Palpitations are less.  There has been no  chest pain, PND or orthopnea.  No signs of heart failure.  Her activity  is limited by arthritis and her age, but she is really functional class  I and has never been hospitalized for heart failure.  She was  hospitalized earlier this year for hypertensive urgency.   CURRENT MEDICATIONS:  1. Glyburide 1 mg a day.  2. Atacand 4 a day.  3. Furosemide 20 daily.  4. Potassium 20 daily.  5. An aspirin a day.  6. Fish oil.  7. Lipitor 40 a day.  8. Carvedilol 25 b.i.d.   PHYSICAL EXAMINATION:  VITAL SIGNS:  Her weight is 212.  Blood pressure  is 150/80, pulse 68 and regular.  Afebrile.  GENERAL:  Affect is appropriate.  HEENT:  Unremarkable.  NECK:  Carotids normal without bruit.  No lymphadenopathy, thyromegaly  or JVP elevation.  LUNGS:  Clear.  Good diaphragmatic motion.  No wheezing.  HEART:  S1.  Second heart sound is split.  PMI  normal.  ABDOMEN:  Benign.  Bowel sounds are positive.  No abdominal aortic  aneurysm.  No hepatosplenomegaly or hepatojugular reflux.  EXTREMITIES:  Distal pulses were intact with no edema.  NEUROLOGIC:  Nonfocal.  No muscular weakness.  SKIN:  Warm and dry.  She does have an area around the right thumb that  has recently been cryo frozen.  It looks a little bit worrisome to me  and needs dermatologic follow up.   IMPRESSION:  1. Cardiomyopathy, non-ischemic, currently functional class I.      Continue current dose of diuretics and ACE inhibitor.  2. History of hypertension, currently well-controlled.  Improved with      carvedilol.  Continue current medication.  3. Premature atrial contractions and premature ventricular      contractions with a history of paroxysmal atrial fibrillation.      Currently on aspirin.  No indication  for Coumadin.  Rhythm appears      more stable with higher dose beta blocker.  4. Dizziness and unsteadiness on her feet.  It would appear to be more      neurological or gait-based.  Follow up with primary care doctor.  5. Lower extremity edema, currently improved.  Previous varicosities      less noticeable.  Continue current dose of furosemide.  6. Diabetes.  Check hemoglobin A1c quarterly.  Continue oral      hypoglycemics.  Avoid Actos if possible.  7. Hyperlipidemia.  No previous coronary disease.  Continue Lipitor 40      a day.  Lipid and liver profile in 6 months.   Overall, I think Anacaren is doing well, and I will see her in 6  months.     [name]. Eden Emms, MD, [facility]  Electronically Signed    PCN/MedQ  DD: 10/26/2007  DT: 10/26/2007  Job #: [id number]

## 2011-04-02 NOTE — Assessment & Plan Note (Signed)
[facility]                            CARDIOLOGY OFFICE NOTE   [name] [name]                 MRN:          [id number]  DATE:08/15/2008                            DOB:          08/31/1923    [name] returns today for followup.  She was recently hospitalized  from August 10, 2008, to August 14, 2008, for a cholelithiasis  with fever.   She has significant COPD and nonischemic cardiomyopathy.   She was recently catheterized for presyncope and chest pain and was  found to have no coronary disease; however, her EF is in the 20% range  with moderate MR.  She currently appears euvolemic.  She has some  chronic fatigue, but no shortness breath, PND, or orthopnea.  She has  trace lower extremity edema.   Her medications were adjusted.  She is to see Dr. Ezzard Standing to further  assess for cholecystectomy.   She does have a history of atrial fibrillation and chronic left bundle-  branch block.  I think that she is a reasonable candidate for  gallbladder surgery if it is absolutely necessary.  I do not know if she  has had more than one bout of cholecystitis.  Her risk in the periop  period would include congestive heart failure and recurrent atrial  fibrillation, but not myocardial infarction.   We would like to be called at the time of surgery to monitor her Is and  Os, adjust her Lasix dose, and to possibly prophylactically give her  amiodarone to prevent any PAF.   She is currently on fish oil, glimepiride 1 mg a day, an aspirin a day,  Lipitor 40 a day, carvedilol 25 b.i.d., benazepril 20 a day, Lasix 20 a  day, and potassium 20 a day.   PHYSICAL EXAMINATION:  GENERAL:  Her exam is remarkable for an elderly  white female in no distress.  VITAL SIGNS:  Blood pressure is 130/80, pulse is 70 and regular,  respiratory rate 14, afebrile.  HEENT:  Unremarkable.  Carotids are normal without bruit.  No  lymphadenopathy, thyromegaly, or JVP  elevation.  LUNGS:  Clear, good diaphragmatic motion.  No wheezing.  CARDIAC:  S1 and S2 with an MR murmur.  PMI normal.  ABDOMEN:  Benign.  Bowel sounds positive.  No AAA, no tenderness, no  bruit, no hepatosplenomegaly, hepatojugular reflux.  EXTREMITIES:  No tenderness.  Distal pulses are intact.  No edema.  NEURO:  Nonfocal.  SKIN:  Warm and dry.  MUSCULOSKELETAL:  No muscle weakness.   IMPRESSION:  1. Congestive heart failure, nonischemic.  Continue current dose of      angiotensin II receptor blocker and low-dose diuretic with      potassium supplementation.  2. History of paroxysmal atrial fibrillation with left bundle-branch      block, stable, currently in sinus rhythm without high-grade heart      block.  3. Cholecystitis.  Follow up with Dr. Ezzard Standing, cleared for      cholecystectomy, would like to be seen in the perioperative period      to adjust her diuretics and possibly a monitor for  paroxysmal      atrial fibrillation.   I talked to [facility] at length, she understands that there is a some  amount of risk in regards to any procedure, but I think I can do her  cholecystectomy laparoscopically and we help with her preop care.  I  think she can get through this if it is clinically indicated to have her  gallbladder out.     [name]. Eden Emms, MD, [facility]  Electronically Signed    PCN/MedQ  DD: 08/15/2008  DT: 08/16/2008  Job #: [id number]

## 2011-04-02 NOTE — Discharge Summary (Signed)
[name], MULNIX NO.:  [id number]   MEDICAL RECORD NO.:  [id number]          PATIENT TYPE:  INP   LOCATION:  5114                         FACILITY:  [facility]   PHYSICIAN:  Hollice Espy, M.D.DATE OF BIRTH:  December 13, 1922   DATE OF ADMISSION:  08/27/2008  DATE OF DISCHARGE:  09/03/2008                               DISCHARGE SUMMARY   PRIMARY CARE PHYSICIAN:  Donia Guiles, MD   DISCHARGE DIAGNOSES:  1. Bacterial pneumonia.  2. Congestive heart failure with acute systolic exacerbation.  3. Diabetes mellitus.  4. History of paroxysmal atrial fibrillation.   DISCHARGE MEDICATIONS:  For this patient are as follows:  1. Avelox 400 mg p.o. daily x3 days.  2. Tussionex 5 mL p.o. q.12 h. p.r.n., #1 bottle.   The patient will continue all of her previous medications.  These are as  follows:  1. K-Dur 20 mEq p.o. daily.  2. Combivent 2 puffs inhaled 4 times a day.  3. Mucinex 1200 p.o. b.i.d.  4. Coreg 25 p.o. b.i.d.  5. Benazepril 20 p.o. daily.  6. Lipitor 40 p.o. daily.  7. Aspirin 81 p.o. daily.  8. Prilosec 20 p.o. daily.  9. Ativan 0.5 mg nightly p.r.n.  10.Unisom 100 mg p.o. daily.  11.Fish oil 1000 p.o. daily.  12.Benadryl 2 tabs p.o. p.r.n.  13.Lasix 20 p.o. daily.   HOSPITAL COURSE:  The patient is an 77 year old white female with past  medical history of CHF and paroxysmal AFib who had had a recent  hospitalization for pneumonia.  At that time, her cough has gotten  progressively worse.  She got to the point where she was having some  nausea.  When she came to emergency room, she was evaluated.  She was  found to have a normal white count, normal hemoglobin, and hematocrit.  A CT scan showed some COPD changes, prominent interstitial markings.  She was given Rocephin and Zithromax in the emergency room.  Vancomycin  was added and for the next several days, the patient continued to show  signs of improvement.  Her antibiotics were adjusted, so  she ended up  being on Fortaz and vancomycin, as well as nebulizers.  For the next  several days, she had slow improvement in her white count which peaked  to 12, started to normalize and by September 03, 2008, a followup chest x-  ray showed resolution of pneumonia.  She received volume from her fluids  and had some mild CHF exacerbation.  Her BNP was no higher than 500.  She was resumed on her Lasix and by September 02, 2008, her BNP was down  to 144.  She was slightly low on potassium.  The potassium on August 31, 2008, being around 3.1, this was replaced.  Her medical issues were  stable and plan will be for the patient to be discharged on September 03, 2008, with 3 more days of antibiotics to obtain a total of 10 days of  coverage.   The patient's overall disposition is improved.  Her activity will be  slow to increase.  She will follow up with PCP, Dr. Izora Ribas  Cassiano in 1  week.   DISCHARGE DIET:  Heart-healthy diet, and she is being discharged to  home.      Hollice Espy, M.D.  Electronically Signed     SKK/MEDQ  D:  09/02/2008  T:  09/03/2008  Job:  [id number]   cc:   Donia Guiles, M.D.

## 2011-04-02 NOTE — H&P (Signed)
NAMECHERIDAN, KIBLER NO.:  [id number]   MEDICAL RECORD NO.:  [id number]          PATIENT TYPE:  INP   LOCATION:  2041                         FACILITY:  [facility]   PHYSICIAN:  Michiel Cowboy, MDDATE OF BIRTH:  1923-04-16   DATE OF ADMISSION:  03/17/2009  DATE OF DISCHARGE:                              HISTORY & PHYSICAL   PRIMARY CARE PHYSICIAN:  __________   CHIEF COMPLAINT:  Shortness of breath.   HISTORY OF PRESENT ILLNESS:  The patient is an 77 year old female with  past medical history significant for coronary artery disease, seen by  Dr. Eden Emms in the past with nonischemic cardiomyopathy.  With EF of 15-  20% per echogram in 2009.  The patient presented with symptoms of cough,  fever, the highest was recorded he had up to 103.0 for the past few  days.  Overall, feeling of malaise and not feeling well.  No sick  contacts.  No significant shortness of breath and occasional wheezing.  Chest pain only when she coughs.  No pleuritic chest pain.  No chest  pain at baseline.  Some nausea but no vomiting, just dry heaving.  No  diarrhea.  No lower extremity swelling.  Otherwise, review of systems  unremarkable.   PAST MEDICAL HISTORY:  1. Significant for status post cholecystitis.  2. Status post lupus.  3. Status post laparoscopic cholecystectomy.  4. Nonischemic cardiomyopathy with EF of 15-20%, mild diastolic      dysfunction, mild to moderate MR and trivial TR.  5. The patient has a normal catheterization in 2002 and maybe in 2007.  6. Chronic systolic congestive heart failure.  7. Chronic dyspnea on exertion.  8. Chronic dizziness.  9. Hypertension.  10.Hyperlipidemia.  11.Diabetes, mild, is diet controlled.  12.Bundle branch block, old.  13.GERD.  14.Anxiety and depression.   ALLERGIES:  PENICILLIN, SULFA AND TETANUS.   SOCIAL HISTORY:  The patient lives alone, does not smoke, does not  drink, does not abuse drugs.  She used to have a husband  who smoked  heavily.  Family history significant for cancer but no coronary artery  disease.   MEDICATIONS:  1. Carvedilol 25 mg twice a day.  2. Benazepril 20 mg daily.  3. Lipitor 20 mg daily.  4. Aspirin 81 mg daily.  5. Prilosec 20 mg daily.  6. Vitamin D.  7. Lasix 20 mg as needed.  Patient usually taking every day.  8. Potassium 20 mEq as needed daily.  9. Fish oil.  10.Ativan 0.5 at bedtime as needed.  11.Benadryl as needed.  12.Sertraline 25 mg p.o. daily.  13.Mucinex as needed.  14.Tylenol as needed.   PHYSICAL EXAMINATION:  VITALS:  Temperature 97.7, blood pressure 163/79.  Of note, the patient endorsed that yesterday her blood pressure was down  to 80s.  But this has improved today, pulse 101, respirations 32,  satting 93% on room air and 100% on 2 liters.  GENERAL:  The patient appears to be in no acute distress.  Although,  does appear short of breath.  No use of accessory muscles.  Unable to  answer in full sentences.  HEAD:  Nontraumatic.  Dry mucous membranes.  Decreased skin turgor.  LUNGS:  Occasional wheezes throughout.  HEART:  Regular rate and rhythm.  There is a systolic and a detached  diastolic murmur noted, blowing murmur.  LOWER EXTREMITIES:  No clubbing, cyanosis or edema.  NEUROLOGIC:  The patient is intact.  Strength 5/5, no focal extremities.  Cranial nerves intact.  ABDOMEN:  Soft, nontender, nondistended.  SKIN:  Clean, dry and intact.   LABORATORY DATA:  White blood cell count 10.1, hemoglobin 12.8, sodium  136, potassium 3.3, creatinine 0.91, albumin 3.1, BNP 106.  Chest x-ray  showing chronic peribronchial thickening.  EKG showing left bundle  branch block which is all D-dimer elevated at 1.52.   ASSESSMENT AND PLAN:  This is an 77 year old female with what seems to  be upper respiratory infection and high fever, present with shortness of  breath.  1. Respiratory infection.  Chest x-ray did not show any evidence of      pneumonia.  This  could be bronchitis exacerbation, but also such      things as influenza could not be ruled out at this point.  Her D-      dimer is positive.  Will obtain CT of the chest as this should help      with evaluation for both PE as well as pulmonary disease.  For      right now, will cover with Avelox and make sure she has Xopenex      p.r.n. and Atrovent scheduled.  Will obtain ABG as the patient does      appear to be fairly short of breath.  Will watch in the step-down      given history of hypertension in the past, elevated temperature and      significant shortness of breath.  2. History of chest pain.  Seems to be fairly atypical, only worse      with cough and likely musculoskeletal, but will cycle cardiac      markers and EKG showing left bundle branch block which is old.      Will check fasting lipid panel, hemoglobin A1c.  The patient had a      negative Myoview in 2007 which is encouraging.  3. Diabetes.  Diet controlled, but we will do sliding scale while in      here.  4. History of hypertension.  Continue carvedilol holding parameters.      Will hold lisinopril for right now, as she had a history of      hypotension just yesterday, to see which way she is trending.  5. History of heart failure fairly significant.  The patient has not      been taking Lasix recently, but she actually feels there seems to      be fluid down.  Will make sure to check orthostatics.  Will give      very gentle IV fluids, and follow BNP, currently BNP is down.  6. Prophylaxis.  Protonix plus Lovenox.  Await results of CT of the      chest.  7. Febrile illnesses.  Could not rule out influenza.  Will obtain H1N1      swabs and will for now start on Tamiflu until this is negative.      Will also check UA plus urine culture to rule out other source of      infection.  Blood culture pending.      Michiel Cowboy, MD  Electronically  Signed     AVD/MEDQ  D:  03/17/2009  T:  03/17/2009  Job:   [id number]   cc:   Donia Guiles, M.D.

## 2011-04-02 NOTE — H&P (Signed)
[name], [name]          ACCOUNT NO.:  [id number]   MEDICAL RECORD NO.:  [id number]          PATIENT TYPE:  INP   LOCATION:  4735                         FACILITY:  [facility]   PHYSICIAN:  Kela Millin, M.D.DATE OF BIRTH:  1923/02/10   DATE OF ADMISSION:  08/10/2008  DATE OF DISCHARGE:                              HISTORY & PHYSICAL   PRIMARY CARE PHYSICIAN:  Dr. Donia Guiles.   CHIEF COMPLAINT:  Chest pain and fevers.   HISTORY OF PRESENT ILLNESS:  The patient is an 76 year old white female  with past medical history significant for nonischemic cardiomyopathy  with ejection fraction 15%-20% per last cardiac cath on July 29, 2008, with no significant coronary artery disease, paroxysmal atrial  fibrillation, chronic left bundle branch block, hypertension, diabetes  mellitus, COPD, GERD, and hyperlipidemia who presents with the above  complaints.  She states that she was in her usual state of health until  the night prior to admission when she began having sudden onset of chest  pain worse with deep inspiration and a cough.  She admits to a cough  productive of white mucus for the past couple of days and states that it  has been on and off for some time.  She also reports that she had a  fever to 102 on the day of presentation.  She admits to dysuria times a  couple of days and that she had 3 watery stools earlier that day but has  not had any further bowel movements since she came to the ER.  She also  reports generalized soreness along with the diffuse, vague abdominal  pain that is worse with coughing.  She denies nausea or vomiting,  hemoptysis, melena, hematochezia and no focal weakness.  It is noted  upon review of the patient's E-chart records that she was hospitalized  earlier this September with atypical chest pain and had a cardiac  catheterization done on 09/11 which revealed no significant coronary  artery disease, severe LV dysfunction consistent with  nonischemic  cardiomyopathy as already mentioned.   She was seen in the ER and point of care markers were negative.  Her  white cell count elevated at 17.6.  Chest x-ray showed pulmonary  vascular congestion and small pleural effusions with changes consistent  with COPD/emphysema.  A D-dimer was done, noted to be elevated at 14.71  and a CT angiogram was ordered per ED physician but not done yet.  She  is admitted for further evaluation and management.  The patient received  morphine 4 mg IV for pain in the ER and following that her blood  pressure dropped to 76/47 but after she was given a fluid bolus her  systolic blood pressure came back up to 105.   PAST MEDICAL HISTORY:  As above.   MEDICATIONS:  1. Coreg 25 mg p.o. b.i.d.  2. Aspirin 81 mg p.o. daily.  3. Benazepril 20 mg daily.  4. Fish oil 1200 mg p.o. daily.  5. Amaryl 1 mg p.o. daily.  6. Lorazepam 0.5 mg p.o. nightly.  7. Prilosec 20 mg daily.  8. KCl 20 mEq p.r.n.  9. Lasix 20 mg p.r.n.  10.Lipitor 40 mg p.o. nightly.  11.Unisom 50 mg 2 p.o. nightly.   ALLERGIES:  1. PENICILLINS.  2. SULFA.  3. TETANUS.   SOCIAL HISTORY:  She denies tobacco.  She also denies alcohol.   FAMILY HISTORY:  Denies history of blood clots, noncontributory to  current illness.   REVIEW OF SYSTEMS:  As per HPI, other review of systems negative.   PHYSICAL EXAM:  In general, the patient is an elderly white female  appears to be in moderate discomfort secondary to pain, in no  respiratory distress.  VITAL SIGNS:  Blood pressure 105/50.  Temperature is 98.8.  Pulse is 94.  Respiratory rate is 20.  O2 sat is 96%.  HEENT:  PERRL, EOMI, slightly dry mucous membranes, no oral exudates.  NECK:  Supple, no adenopathy, no JVD and no thyromegaly.  LUNGS:  Decreased air movement bilaterally, no wheezes and no crackles.  CARDIOVASCULAR:  Regular rate and rhythm.  Normal S1, S2.  ABDOMEN:  Soft, mild lower abdominal tenderness, no rebound  tenderness.  Bowel sounds present, nondistended.  EXTREMITIES:  No cyanosis and no edema.  NEURO:  She is alert and oriented x3.  Cranial nerves II-XII grossly  intact.  Nonfocal exam.   LABORATORY DATA:  EKG shows sinus tachycardia with occasional PVCs and  fusion complexes, left bundle branch block rate 104, white cell count is  17.6, hemoglobin 15.2, hematocrit 42.9, platelet count 186, sodium of  140, potassium 4.0, chloride 108, BUN of 16, creatinine 1.1, glucose  135, ionized calcium is 1.04.  Point of care markers negative x1.  D-  dimer is 14.71.  Chest x-ray as per HPI and CT angiogram pending.   ASSESSMENT AND PLAN:  1. Chest pain - as discussed above, 77 year old presenting with      pleuritic chest pain and markedly elevated D-dimer, she is status      post cardiac catheterization on 09/11 with no significant coronary      artery disease.  CT angiogram ordered and pending at this time.  We      will follow, will start on empiric Lovenox for possible pulmonary      embolus pending CT angiogram.  Pain management, follow and treat      accordingly pending results.  2. Leukocytosis - the patient also with lower abdominal tenderness on      exam.  We will obtain a urinalysis, follow and consider further      imaging studies pending this result.  Chest x-rays as above with no      acute infiltrates.  We will obtain blood cultures, follow and      manage as appropriate pending studies.  3. Chronic obstructive pulmonary disease/bronchitis - bronchodilators,      Mucinex, follow.  4. Diabetes mellitus - monitor Accu-Cheks.  Cover with sliding scale.      Continue Amaryl.  5. History of hypertension - as noted above decreased blood pressures      following morphine, now improved following fluid bolus.  Will hold      angiotensin converting enzyme inhibitor and Lasix for now.  Monitor      blood pressures and treat accordingly.  6. History of nonischemic cardiomyopathy - monitor  fluid status and      treat accordingly.  Continue outpatient      medications except as stated above.  7. History of paroxysmal atrial fibrillation - continue aspirin and      carvedilol.  8. History of hyperlipidemia - continue outpatient medications.      Kela Millin, M.D.  Electronically Signed     ACV/MEDQ  D:  08/11/2008  T:  08/11/2008  Job:  [id number]   cc:   Donia Guiles, M.D.  [name]. Eden Emms, MD, [facility]

## 2011-04-02 NOTE — H&P (Signed)
[name], [name] NO.:  [id number]   MEDICAL RECORD NO.:  [id number]          PATIENT TYPE:  EMS   LOCATION:  MAJO                         FACILITY:  [facility]   PHYSICIAN:  Hollice Espy, M.D.DATE OF BIRTH:  1922-12-24   DATE OF ADMISSION:  08/04/2007  DATE OF DISCHARGE:                              HISTORY & PHYSICAL   PRIMARY CARE PHYSICIAN:  Donia Guiles, M.D.   CARDIOLOGIST:  [name]. Eden Emms, MD, [facility]   CHIEF COMPLAINT:  Chest discomfort.   HISTORY OF PRESENT ILLNESS:  Patient is an 77 year old white female with  a past medical history of nonischemic cardiomyopathy with an ejection  fraction of 35-40% as well as diabetes mellitus and hypertension, who  presents to the emergency room after having episodes of chest discomfort  and high blood pressure.  She tells me she has been relatively well.  She follows with Dr. Eden Emms in the office, although she had noted that  over the last month, she has been having problems getting her blood  pressure, both systolic and diastolic, down.  At times, it has been  higher.  Today, she started feeling some pain, more on the right side of  her chest but was concerned because she has had some mild posterior  shortness of breath with it.  When she checks her blood pressure, she  found it to be elevated with a systolic greater than 200, diastolic  greater than 100.  Repeat checks of this found this to continue to be  persistently high, and she called the paramedics, who brought her into  the emergency room.  By the time she arrived in the emergency room, her  blood pressure was noted to be 167/109.  Heart rate was 111, which since  then has settled down to a heart rate of 78 and a blood pressure of  155/77.   Labs were ordered on the patient, including cardiac markers, chest x-  ray, and basic labs, all of which were normal.   Her EKG noted sinus tach initially with consecutive PVCs and PACs but no  evidence of any  acute ST-T wave elevations and no evidence of any atrial  fibrillation, which patient has had in the past.  Currently, she is  feeling much better.  She said she is back at her baseline.  She denies  any headaches, visual changes, dysphagia, chest pain, palpitations,  shortness of breath, wheezing.  She does have a chronic cough but did  not appear any worse more so than usual.  This is a nonproductive cough.  She denies any abdominal pain, no hematuria, dysuria, constipation,  diarrhea, focal extremity weakness or pain.  She started having lower  extremity numbness, mostly in the feet, left greater than right, having  gone on for the past month as well intermittently.   Past medical history includes nonischemic cardiomyopathy with an EF of  35-40%, hypertension, hyperlipidemia, diabetes mellitus, and previous  history of paroxysmal A fib.  Patient has a history of COPD with chronic  bronchitis.   CURRENT MEDICATIONS:  1. Coreg 12.5 b.i.d.  2. Atacand 4.  3. Lipitor 40.  4. Glimepiride 1.  5. Torsemide 20.  6. K-Dur 20.  7. Aspirin 81.  8. Advil 400 nightly.  9. Prilosec 20.  10.Fish oil 1000 daily.   She has allergies to PENICILLIN, SULFA, and TETANUS TOXIN.   SOCIAL HISTORY:  She denies any tobacco, alcohol, or drug use.   FAMILY HISTORY:  Noncontributory.   PHYSICAL EXAMINATION:  VITALS ON ADMISSION:  Temp 98, heart rate 111,  now down to 78, blood pressure 167/109, now down to 155/77, respirations  18, O2 sat 92% on room air.  GENERAL:  She is alert and oriented x3.  No apparent distress.  HEENT:  Normocephalic and atraumatic.  Mucous membranes are moist.  She has no carotid bruits.  HEART:  Regular rhythm.  S1 and S2.  Frequent PVCs.  Very soft.  LUNGS:  Decreased breath sounds throughout.  ABDOMEN:  Soft, nontender, nondistended.  Positive bowel sounds.  EXTREMITIES:  No clubbing or cyanosis.  Trace pitting edema.  Peripheral  pulses.   LAB WORK:  White count 11.3,  H&H 14.8 and 43, MCV 94, platelet count  227, no shift.  Sodium 142, potassium 2.7, chloride 108, bicarb 26, BUN  14, creatinine 0.9, glucose 157.  LFTs are notable for an albumin of  3.4, lipase 16, CPK 70, MB 2.5.  Troponin I 0.02.  BNP 240.   ASSESSMENT/PLAN:  1. Chest pain in a patient with a history of nonischemic      cardiomyopathy and decreased ejection fraction:  Currently, she      appears to be stable, although with her previous history, certainly      will admit the patient and place her on a telemetry bed for      monitoring.  Will check two more sets of cardiac markers, continue      her other medications, and will also ask [facility] to      follow up for further assessment.  Patient does not appear to be in      any form of volume overload or pneumonia.  2. Diabetes mellitus:  Continue glimepiride and sliding scale.  3. Gastroesophageal reflux disease:  Continue Prilosec.      Hollice Espy, M.D.  Electronically Signed     SKK/MEDQ  D:  08/04/2007  T:  08/04/2007  Job:  [id number]   cc:   [name]. Eden Emms, MD, [facility]  Donia Guiles, M.D.

## 2011-04-02 NOTE — Discharge Summary (Signed)
[name], [name]          ACCOUNT NO.:  [id number]   MEDICAL RECORD NO.:  [id number]          PATIENT TYPE:  INP   LOCATION:  6732                         FACILITY:  [facility]   PHYSICIAN:  Corinna L. Lendell Caprice, MDDATE OF BIRTH:  [birth year]   DATE OF ADMISSION:  10/15/2008  DATE OF DISCHARGE:  10/17/2008                               DISCHARGE SUMMARY   DISCHARGE. DIAGNOSES:  1. Abdominal pain, vomiting, leukocytosis and relative hypotension,      resolved.  2. Nonischemic cardiomyopathy with ejection fraction 20%, compensated.  3. Diet-controlled type 2 diabetes.  4. Cholelithiasis.  5. Stable chronic obstructive pulmonary disease.  6. Suspect pneumonia.  7. Pleuritic chest pain, resolved.  8. Hypertension.  9. Gastroesophageal reflux disease.  10.Hyperlipidemia.  11.Acute maxillary sinusitis.   DISCHARGE MEDICATIONS:  1. Avelox 400 mg daily for 4 more days.  She may continue the rest of      her medications as blood pressure tolerates including carvedilol 25      mg p.o. b.i.d., benazepril 20 mg a day, Lipitor 20 mg a day,      aspirin 81 mg a day, Prilosec 20 mg a day, vitamin D daily, Lasix      20 mg as needed for swelling/weight gains/shortness of breath.  2. Potassium chloride 20 mEq with Lasix as needed.  3. Fish oil capsules 1000 mg daily.  4. Benadryl as needed.   CONDITION:  Stable.   ACTIVITY:  Ad lib.  Follow up with Dr. Donia Guiles in 2-4 weeks or  sooner if symptoms recur.   DIET:  Low salt diabetic.   LABS:  White blood cell count on admission 22,000 with 96% neutrophils,  2% lymphocytes.  Erythro, CBC was unremarkable.  At discharge, her white  count is 10,000 with 80% neutrophils.  Hemoglobin on admission was 14  and at discharge is 11.1.  Hematocrit on admission 40.5 and at discharge  is 31.9, platelet count normal on admission was 173, at discharge is 120  which may be somewhat delusional.  D-dimer 6.45.  Capillary blood  glucose has been  running 80 to about 120.  Complete metabolic panel on  admission significant for a glucose of 168, BUN 15, creatinine 0.94,  albumin 3.3, otherwise unremarkable.  Amylase and lipase normal.  Venous  lactic acid normal.  Hemoglobin A1c 5.7.  Point of care enzymes  negative.  Urinalysis on admission showed a specific gravity of greater  than 1.046, small bilirubin, 15 ketones, negative blood, 30 protein,  negative nitrite, negative leukocyte esterase, few bacteria.  Two views  of the chest on admission showed chronic interstitial changes with small  pleural effusions.  CT angiogram of the chest showed no PE, coronary  artery and aortic calcifications, borderline mediastinal adenopathy,  nonspecific tiny bilateral pleural effusions.  Ultrasound of the abdomen  showed gallstones, hepatic and right renal cysts.  No evidence of  cholecystitis.  Repeat chest x-ray on October 16, 2008 showed slight  increase in mild diffuse interstitial infiltrates or edema, stable small  pleural effusions.  EKG showed normal sinus rhythm with first-degree AV  block and left bundle branch  block which is old.   HISTORY AND HOSPITAL COURSE:  Ms. Creech is a pleasant 77 year old  white female with multiple medical problems who presented with  intractable vomiting.  She had diffuse abdominal pain, initially a  nonproductive cough which later became productive of clear sputum, also  pleuritic chest pain.  She had no diarrhea and no fevers, or chills.  She had not been constipated.  She did have pain in the left maxillary  area and tooth pain.  She had lot of rhinorrhea.  She had no  hematemesis.  She has a history of cholelithiasis and apparently was  considered for elective cholecystectomy at some point.  The abdominal  pain and chest pain was mainly in the epigastric area not in the right  upper quadrant area.  She was hypotensive in the emergency room with a  blood pressure of 89 systolic, initially a  respiratory rate was 28,  normal oxygen saturations.  Her temperature was 99.0.  She had slightly  dry mucous membranes.  She had normal bowel sounds, soft, completely  nontender and nondistended abdomen.  She did have some reproducible  chest wall pain in her lower sternal area.  Lungs were clear.  She had  no edema.  She had a white blood cell count of 22,000.  As she had  hypotension and leukocytosis, she was admitted, given IV resuscitation  to which she responded well..  She also was given empiric Flagyl and  ciprofloxacin as well as antiemetics and supportive care.  Her vomiting  resolved within 24 hours.  She still had some dyspepsia, but is  tolerating a solid diet.  She continued to have a cough.  She continued  to have clear breath sounds.  She had no orthopnea, dyspnea on exertion,  or leg swelling.  I suspect she may have a community acquired pneumonia  versus slight pulmonary edema, given chest x-ray.  However, her exam and  story is not consistent with congestive heart failure.  She was started  back on her cardiac medications and is feeling well and ready for  discharge.  Total time on the day of discharge is 40 minutes.      Corinna L. Lendell Caprice, MD  Electronically Signed     CLS/MEDQ  D:  10/17/2008  T:  10/17/2008  Job:  [id number]   cc:   Donia Guiles, M.D.  [name]. Eden Emms, MD, [facility]  Sandria Bales. Ezzard Standing, M.D.

## 2011-04-02 NOTE — H&P (Signed)
NAMEBRYELLA, DIVINEY          ACCOUNT NO.:  [id number]   MEDICAL RECORD NO.:  [id number]          PATIENT TYPE:  INP   LOCATION:  5501                         FACILITY:  [facility]   PHYSICIAN:  Kela Millin, M.D.DATE OF BIRTH:  19-Nov-1922   DATE OF ADMISSION:  11/25/2008  DATE OF DISCHARGE:                              HISTORY & PHYSICAL   PRIMARY CARE PHYSICIAN:  Donia Guiles, M.D.   CHIEF COMPLAINT:  Abdominal pain and fevers.   HISTORY OF PRESENT ILLNESS:  The patient is an 77 year old white female  with past medical history significant for known cholelithiasis followed  by Dr. Ezzard Standing, diabetes mellitus, COPD, GERD, paroxysmal atrial  fibrillation, hypertension, chronic left bundle branch block,  nonischemic cardiomyopathy with last ejection fraction 15-20% per cath  in September 2009, who presents with the above complaints.  She reports  that she has had epigastric pain radiating to her back between her  shoulder blades and also lower abdominal pain for the past couple of  days.  She describes the epigastric pain as sharp, 5-6/10 in intensity.  She has had nausea but no vomiting.  She also reports that she has had  dysuria times one week and subjective fevers for one day.  She also  reports vague lower abdominal pain for the past 1-2 days.  The patient  also complains of nonproductive cough for the past 3-4 days and that she  has been hurting all over her chest even when she is not coughing.  She  denies diaphoresis, shortness of breath, PND, orthopnea and no leg  swelling.  Today, she just felt that she was hurting all over and so  decided to go to the urgent care.  She was seen initially at the urgent  care and asked to come to the ER.   In the ER, a urinalysis was done which showed 21-50 WBCs, many bacteria  and large leukocyte esterase, a CT scan of her abdomen and pelvis showed  no acute abdominal findings, hepatic and renal cyst appear stable and  her  cholelithiasis again noted.  An ultrasound of her abdomen also  showed gallstones and sludge again, no thickening of the gallbladder  wall or sonographic Murphy's sign, normal common bile duct stable right  hepatic lobe cysts and renal cyst again noted.  Her white cell count  elevated at 16, her lipase 14 and point of care markers negative times  one with a brain natruretic peptide of 163.  She is admitted for further  evaluation and management.  It is noted that the patient was initially  diagnosed with cholelithiasis in September 2009 and set up to follow up  outpatient for elective cholecystectomy per surgery, and she states that  she has another follow up outpatient appointment scheduled.   PAST MEDICAL HISTORY:  1. As above.  2. History of hepatic and renal cysts.  3. History of hyperlipidemia.   MEDICATIONS:  1. Carvedilol 25 mg p.o. b.i.d.  2. Benazepril 20 mg p.o. daily.  3. Lipitor 20 mg daily.  4. Aspirin 81 mg daily.  5. Prilosec 20 mg daily.  6. Vitamin D.  7.  Lasix 20 mg p.r.n.  8. KCl 20 mEq p.r.n.  9. Fish oil 1000 mg daily.  10.Lorazepam 0.5 mg daily.  11.Benadryl p.r.n.   ALLERGIES:  PENICILLIN, SULFA AND TETANUS.   SOCIAL HISTORY:  She denies tobacco, also denies alcohol.   FAMILY HISTORY:  Reviewed and noncontributory to current illness.   REVIEW OF SYSTEMS:  As per HPI, other review of systems negative.   PHYSICAL EXAMINATION:  GENERAL:  The patient is an elderly white female,  coughing intermittently but in no respiratory distress.  VITAL SIGNS:  The temperature is 98, blood pressure 116/50, pulse 73,  respiratory rate of 22, O2 sat 97%.  HEENT:  PERRL, EOMI, sclerae anicteric, moist mucous membranes and no  oral exudates.  NECK:  Supple, no adenopathy, no thyromegaly and no JVD.  LUNGS:  Coarse breath sounds with relatively decreased air entry  bilaterally, no wheezes and no crackles.  CARDIOVASCULAR:  Regular rate and rhythm, normal S1-S2.   ABDOMEN:  Soft, bowel sounds present, she has diffuse abdominal pain but  that is greater in her epigastrium.  No rebound tenderness, no masses  palpable.  No organomegaly.  EXTREMITIES:  No cyanosis and no edema.  NEUROLOGICAL:  She is alert and oriented x3.  Cranial nerves II-XII  grossly intact.  Nonfocal exam.   LABORATORY DATA:  As per HPI.  Also, her sodium is 138 with a potassium  of 3.4, chloride 101 with a CO2 of 25, glucose 116, BUN is 24 with a  creatinine of 1.29, calcium 9.0, total protein of 6.6.  Her albumin is  3.2, AST is 20, lactic acid is 2.5.   Chest x-ray:  No acute cardiopulmonary process, peribronchial  thickening, resolved bilateral pleural effusions.   ASSESSMENT AND PLAN:  1. Abdominal pain.  Likely multifactorial.  She has a UTI with      cholelithiasis although without evidence of cholecystitis as per      imaging study reports, and she also has GERD.  Will treat a UTI      with empiric antibiotics, place on PPI for her GERD, follow her      clinical course once this adequately treated and consider re-      consulting surgery regarding her cholelithiasis pending her      clinical course.  2.  Chest discomfort.  Will obtain cardiac      enzymes, it is likely secondary to bronchitis given her cough and      chest x-ray consistent with peribronchial thickening, and no acute      cardiopulmonary disease.  2. COPD with acute bronchitis.  Will place on nebulized      bronchodilators and expectorants and follow.  3. Urinary tract infection.  As above, obtain cultures, empiric      antibiotics and follow.  4. Diabetes mellitus.  Diet controlled, monitor Accu-Cheks.  Cover      with sliding scale.  5. Nonischemic cardiomyopathy.  Monitor fluid status.  Continue      outpatient medications.  6. GERD.  Place on PPI.  7. Paroxysmal atrial fibrillation.  Her rate is controlled.  Will      continue aspirin and Carvedilol.  8. Hypertension.  Continue outpatient  medications, also monitor blood      pressures and further treat accordingly.  9. Hepatic and renal cysts.  Stable per imaging studies.  Follow up      outpatient.  10.Hyperlipidemia.  Continue outpatient medications.  11.Hypokalemia.  Replace potassium.  Kela Millin, M.D.  Electronically Signed     ACV/MEDQ  D:  11/26/2008  T:  11/27/2008  Job:  [id number]   cc:   Donia Guiles, M.D.  Sandria Bales. Ezzard Standing, M.D.  [name]. Eden Emms, MD, [facility]

## 2011-04-02 NOTE — Consult Note (Signed)
[name], [name] NO.:  [id number]   MEDICAL RECORD NO.:  [id number]          PATIENT TYPE:  INP   LOCATION:  4734                         FACILITY:  [facility]   PHYSICIAN:  Sandria Bales. Ezzard Standing, M.D.  DATE OF BIRTH:  May 17, 1923   DATE OF CONSULTATION:  08/14/2008  DATE OF DISCHARGE:  08/14/2008                                 CONSULTATION   Date of consultation ??   REFERRING PHYSICIAN:  Kela Millin, MD   REASON FOR CONSULTATION:  Gallbladder surgery.   HISTORY OF ILLNESS:  [name] is a pleasant widowed 77 year old who  sees [name] as her primary care doctor, and sees [name]  from a cardiac standpoint.  Interestingly, on the July 29, 2008,  she underwent a cardiac catheterization by [name] that shows no  significant coronary artery disease, but severe left ventricular  dysfunction consistent with a nonischemic cardiomyopathy with her  ejection fraction being 15-20%.   Apparently, she was doing well until about the middle of last week when  she developed chest pain, had a leukocytosis, and was admitted by Dr.  Suanne Jimenez to the [facility].   Her evaluation has included a CAT scan of her abdomen that shows a liver  cyst, a small right pleural effusion, and some diverticulosis.   An ultrasound which shows gallbladder stone and sludge.  Her initial  white blood count was 17,600 on the August 10, 2008 and now is down  to 6000.  Her amylase on the August 11, 2008 was 213, now down to 80.  Her total bilirubin count was 2.3 and her alkaline phosphatase was 41.   She has had some vague kind of GI symptoms.  She says you really had not  felt right for some time.  I think part of this has been blamed on her  cardiac status.  She denies any history of liver disease, pancreatic  disease, or colon disease.  She has had no prior abdominal surgery.   PAST MEDICAL HISTORY:  1. Neurologic:  She has had no history of  seizure or loss of      consciousness.  2. Pulmonary:  She has COPD.  3. Cardiac:  Again see history of present illness, but she has had a      recent cardiac catheterization, though I talked to [name], and      I think [name] has seen her since this rehospitalization, again      apparently normal cardiac enzymes on admission.  4. Gastrointestinal:  See history of present illness.  5. Urologic:  No evidence of kidney stones or kidney infections.  6. Endocrine:  She is diabetic, on oral hypoglycemics, and she has      hypertension.   ADMISSION MEDICATIONS:  1. Coreg 25 mg b.i.d.  2. Aspirin 81 mg daily.  3. Benazepril 20 mg daily.  4. Fish oil 1200 mg daily.  5. Amaryl 1 mg daily.  6. Lorazepam 0.5 mg daily.  7. Prilosec 20 mg daily.  8. Potassium.  9. Lasix 20 mg p.r.n.  10.Lipitor daily.  11.Unisom.   ALLERGIES:  She says that she is allergic to PENICILLIN and SULFA.   PHYSICAL EXAMINATION:  GENERAL:  She is an elderly white female who is  pleasant, really is in no distress at this time.  VITAL SIGNS:  Her temperature is 97.9, pulse is 68, and blood pressure  is 122/63.  HEENT:  Unremarkable.  NECK:  Supple with no masses or thyromegaly.  LUNGS:  Clear to auscultation with symmetric breath sounds.  HEART:  Regular rate and rhythm.  ABDOMEN:  Benign.  She has no tenderness, no guarding, no peritoneal  signs, and no abdominal mass.  She has a bruits along her  right groin  from this recent heart catheterization.  EXTREMITIES:  She has good strength in upper and lower extremities.  NEUROLOGIC:  Grossly intact.   LABORATORY DATA:  Labs that I have show sodium 146, potassium 3.5,  chloride 114, CO2 of 26, glucose of 98, and creatinine of 0.77.  Most  recent amylase was 80, again it was high as 213.  Her white blood count  was 6000, her hemoglobin was 11, and hematocrit 32.  Her liver function  showed a mildly elevated bilirubin at 2.3, but otherwise normal liver   functions.   IMPRESSION:  1. Probable gallbladder disease, possible mild pancreatitis with      recent acute symptoms.  Thus now, the patient is totally      asymptomatic.   I discussed with her about gallbladder surgery that can be done  laparoscopically and open.  The risks of surgery include bleeding,  infection, common bile duct injury with possibility of open surgery, but  right now she is asymptomatic and it is a weekend and certainly seems  reasonable to let her go home and set this up on a more elective basis.  I want to have cardiac clearance or at least [name] evaluation and  make sure there is nothing else that would be there from a cardiac  standpoint, particularly since she just had a cardiac catheterization  and make sure that nothing else we all be aware of before surgery.   1. Mildly elevated amylase, which has now returned to normal.  2. Nonischemic cardiomyopathy with an ejection fraction of only about      15-20%.  She has an appointment to see [name] tomorrow and I      will try to get her to keep that appointment.  3. Chronic obstructive pulmonary disease.  4. Non-insulin-dependent diabetes mellitus.  5. Hypertension.  6. Hypercholesterolemia.      Sandria Bales. Ezzard Standing, M.D.  Electronically Signed     DHN/MEDQ  D:  08/14/2008  T:  08/15/2008  Job:  [id number]   cc:   [name], M.D.  Kristen Pick. Eden Emms, MD, [facility]  [name], M.D.

## 2011-04-02 NOTE — Discharge Summary (Signed)
NAME:  MARAE, COTTRELL NO.:  [id number]   MEDICAL RECORD NO.:  [id number]          PATIENT TYPE:  INP   LOCATION:                                 FACILITY:   PHYSICIAN:  [name]. Eden Emms, MD, FACCDATE OF BIRTH:  01/25/1923   DATE OF ADMISSION:  07/28/2008  DATE OF DISCHARGE:  07/30/2008                         DISCHARGE SUMMARY - REFERRING   ADDENDUM:  Ms. Riester was discharged home on the afternoon of  September 11.  Orders were written, discharge information provided.  However, the patient was not discharged home for unknown reasons.  There  was not any documentation in the chart as to why her discharge which was  held.  The patient stated that she asked staffing several times in  regards to her potential discharge but with the information provided as  far as I know no one was called or questioned about this.   On the morning of the 12th I was called by Charlcie Cradle, her nurse, questioning  about potential discharge.  I explained to Marge that she should have  gone home last night as that all the paperwork and the discharge  instructions and the discharge order was documented.  After reviewing  the chart I reaffirmed that this documentation was complete.  Marge  states that she will look into as why the patient was not discharged  last evening.  Otherwise the discharge information from the previous  dictation number [id number] remains unchanged.      Joellyn Rued, PA-C      [name]. Eden Emms, MD, [facility]  Electronically Signed    EW/MEDQ  D:  07/30/2008  T:  07/30/2008  Job:  [id number]   cc:   [name]. Eden Emms, MD, [facility]

## 2011-04-02 NOTE — Assessment & Plan Note (Signed)
[facility]                            CARDIOLOGY OFFICE NOTE   Kristen, Jimenez                 MRN:          [id number]  DATE:07/28/2008                            DOB:          February 07, 1923    [name] returns today for followup.  She is 77 years old now.  She  has nonischemic cardiomyopathy, good left bundle-branch block.  She was  recently hospitalized for pneumonia and dehydration.  She continues to  have exertional dyspnea.  She is probably functional class II to III.  She has not been overly congested and actually may be a bit on the low  output side.   She takes her Lasix and potassium 3-4 times a week as needed.  Her  weight has been stable.  He has not had PND, orthopnea.  She has trouble  sleeping, but this is more insomnia and Unisom helps with this.  She  gets an occasional atypical pain starting under her left axilla.  It is  nonexertional.  I doubt it is angina.  She has had previous caths with  no coronary disease.   From a cardiac perspective, she has not had any palpitations, evidence  of recurrent AFib, or significant arrhythmias.   She had a 2-D echocardiogram which I reviewed.  Her EF unfortunately is  15-20% with mild-to-moderate MR.  I talked with [name] at length, she  is on fairly good medical therapy at this point.  I think her symptoms  are stable; however, despite her advanced age, she may be a candidate  for BiV pacemaker in the future.  She does have a wide left bundle  branch block with a QRS of about 170 milliseconds and is somewhat  prolonged PR interval at 240.  She may benefit from pacing at a short PR  interval with a narrow QRS with a left-sided lead and then she is  willing to entertain this if her symptoms worsen.   Her review of systems otherwise negative.   CURRENT MEDICATIONS:  Include,  1. Lasix 20 a day p.r.n.  2. Potassium 20 a day p.r.n.  3. Glimepiride 1 mg a day.  4. Aspirin a  day.  5. Fish oil.  6. Lipitor 40 a day.  7. Carvedilol 25 b.i.d.  8. Benazepril 20 a day.  9. Prilosec over the counter.   PHYSICAL EXAMINATION:  VITAL SIGNS:  Remarkable for weight of 196, blood  pressure 130/80, pulse 79, regular, respiratory rate 14, afebrile.  HEENT:  Unremarkable.  NECK:  Carotids normal without bruit, no lymphadenopathy, thyromegaly,  JVP elevation.  LUNGS:  Clear with good diaphragmatic motion.  No wheezing.  HEART:  The patient has an S1 and S2.  PMI is increased with an MR  murmur.  ABDOMEN:  Benign.  Bowel sounds positive.  No AAA, no tenderness, no  bruit, no hepatosplenomegaly, no hepatojugular reflux.  No tenderness.  EXTREMITIES:  Distal pulses were intact.  No edema.  NEURO:  Nonfocal.  SKIN:  Warm and dry.  No muscular weakness.   IMPRESSION:  1. Nonischemic cardiomyopathy with decreased left ventricular  function, currently functional class II to III.  Continue p.r.n.      Lasix and current dose of angiotensin-converting enzyme inhibitor.  2. Left bundle-branch block with first-degree heart block.  Consider      biventricular pacemaker in the future, but currently not      symptomatic enough, no evidence of high-grade heart block or      syncope.  3. Diabetes.  Followup hemoglobin A1c.  Continue oral hypoglycemic.  4. Hyperlipidemia.  Continue Lipitor 40 a day.  Lipid and liver      profile in 6 months.  5. Hypertension.  Currently well controlled.  Continue current dose of      medications.  6. History of paroxysmal atrial fibrillation.  Currently maintaining      sinus rhythm.  Continue aspirin therapy.   ADDENDUM:  On the way out, [name] developed some chest pain.  She was  brought back to one of our exam rooms.  We did not do an EKG, since it  would not be revealing in regards to her chronic left bundle branch  block, we will continue to monitor her.  She has not had a heart cath  since 2002 depending on how she feels.  She may  need to be admitted to  the hospital for further workup including right and left heart  catheterization.     [name]. Eden Emms, MD, [facility]  Electronically Signed    PCN/MedQ  DD: 07/28/2008  DT: 07/29/2008  Job #: [id number]

## 2011-04-02 NOTE — Assessment & Plan Note (Signed)
[facility]                            CARDIOLOGY OFFICE NOTE   Kristen, Jimenez MEMORI [name]                 MRN:          [id number]  DATE:04/25/2008                            DOB:          [birth year]    [name] returns today for followup.  She has had a nonischemic  cardiomyopathy, with an EF of 35%-40%.  She needs a recheck of her LV  function in September 2009.  She has been doing well.  She has not been  in the hospital.  Her EF has been as low as 20%.  She has had a history  of PAF and PACs that have been stable.  She is not having significant  palpitations.  Her other coronary risk factors include hypertension,  which is well controlled.   She brought in a nice list for me to review regarding her blood sugars.  They have been running a little high in the mornings, but not too bad.  Blood pressures have good.   She is not having PND or orthopnea.  Her weight is stable.  There has  been no lower extremity edema, no chest pain, no syncope.   Her review of systems is otherwise remarkable for cough.  She is to see  Dr. Jearld Fenton.  This is not related to her long-term benazepril, as far as I  can tell.   She is currently on:  1. Glyburide 1 mg a day.  2. Furosemide 20 a day.  3. Potassium 20 a day.  4. Aspirin.  5. Fish oil.  6. Lipitor 40 a day.  7. Carvedilol 25 b.i.d.  8. Benazepril 20 a day.  9. Prilosec over the counter.   PHYSICAL EXAMINATION:  GENERAL:  Remarkable for a well-preserved  octogenarian in no distress.  Affect appropriate.  VITAL SIGNS:  Weight 208, which is down 4 pounds, blood pressure 140/80,  pulse 77 and regular, respiratory rate 14, afebrile.  HEENT:  Unremarkable.  NECK:  Carotids normal, without bruit.  No lymphadenopathy, thyromegaly,  or JVP elevation.  LUNGS:  Clear.  Good diaphragmatic motion.  No wheezing.  CARDIOVASCULAR:  S1, S2.  PMI not palpable.  No murmur.  ABDOMEN:  Benign.  Bowel sounds positive.  No  AAA, no bruit, no  tenderness, no hepatosplenomegaly or hepatojugular reflux.  EXTREMITIES:  Distal pulses are intact, no edema.  NEUROLOGIC:  Nonfocal.  SKIN:  Warm and dry.  MUSCULOSKELETAL:  No muscular weakness.   EKG:  Shows a left bundle branch block.   IMPRESSION:  1. Cardiomyopathy.  Follow up LV function in December 2009.  Continue      current dose of diuretic and ACE inhibitor.  2. Hypertension, currently well controlled.  Continue a low-salt diet.      Continue current dose of beta blocker, diuretic, and benazepril.  3. Left bundle branch block, stable.  No evidence of high-grade heart      block or syncope.  4. Diabetes.  Continue glyburide.  Hemoglobin A1c quarterly.  5. History of atrial fibrillation and premature atrial contractions,      currently stable.  No evidence of clinical recurrence.  Continue      beta blocker.   I will see her back in September 2009 when she has her echocardiogram.     [name]. Eden Emms, MD, [facility]  Electronically Signed    PCN/MedQ  DD: 04/25/2008  DT: 04/25/2008  Job #: [id number]

## 2011-04-05 NOTE — Assessment & Plan Note (Signed)
[facility]                            CARDIOLOGY OFFICE NOTE   Jamell, Opfer PENNEY DOMANSKI                 MRN:          [id number]  DATE:02/18/2007                            DOB:          [birth year]    Annaclaire continued to be unsteady on her feet with some dizziness.  She  saw Dr. Jearld Fenton.  He placed her on some medication which sounded like an  allergy medicine.  She had no, I believe it was Singulair, she had no  benefit from it and stopped it on her own since it was expensive.  The  patient's symptoms clearly sound like Meniere's or chronic inner ear  problem with some gait instability.  She does have a left bundle branch  block, but she has never had high-grade heart block.  Her symptoms are  positional; however, they are not totally postural.  They tend to be  worse with neck movements in a particular direction.   If anything, Ahnyla's blood pressure has been running on the high  side.  She asked me to go over her med list and see if there is any  medicines we could try stopping.  I told her I was a little hesitant to  do this, because as far as I can tell, her blood pressures really fall  below 150.  She actually had a blood pressure record with her, and of  the 20 readings that she had, only one of them had a low blood pressure,  and I suspect this was previously low.   CURRENT MEDICATIONS:  Patient's current medicines include:  1. Glimepiride 1 mg a day.  2. Atacand 4 mg a day.  3. Furosemide 20 a day.  4. Potassium 20 a day.  5. An aspirin a day.  6. Coreg 12.5 b.i.d.  7. Prilosec and Lipitor 40 a day.   I did tell her that if her heart rate was under 60, she could skip her  afternoon dose of Coreg.  We also decided for the time being to try to  stop her Atacand, since this is more likely to cause a significant  decrease in blood pressure.   EXAMINATION:  GENERAL:  She is not postural.  VITAL SIGNS:  The blood pressure is 140/78  sitting and standing.  The  pulse goes from 75 to 80 from sitting to standing.  HEENT:  Normal.  There is no carotid bruits.  LUNGS:  Clear.  HEART:  There is an S1, S2 with a systolic murmur.  ABDOMEN:  Benign.  LOWER EXTREMITIES:  Intact pulses, no edema.   IMPRESSION:  1. Non-ischemic cardiomyopathy with left bundle branch block, EF 35-      40%.  2. Dizziness not clearly related to heart block, VT or other      arrhythmia.  I do not think she needs a monitor at this time.  I am      a little bit hesitant to stop her medications, but will try to help      [facility].  She will hold her Atacand, if her blood pressures rise  over 160 systolic, or if she has no benefit from stopping the      Atacand she will resume it.  She will continue to take her Coreg      once a day, unless her heart rate is over 60.   We will recheck her LV function in 6 months to a year.   The patient has had PAF in the past and used to be on Coumadin; however,  she has been in stable sinus rhythm for quite some time, and we have not  had to resume this.   Her left bundle branch block is stable.   Again, I do not think that she needs monitoring at this time, since her  symptoms really do not seem arrhythmic in nature.  I will see her back  in about 8 weeks to re-assess her blood pressure and symptoms of  dizziness.  She did have a carotid duplex study at the end of December  in 2007, and she has no ICA stenosis.     [name]. Eden Emms, MD, [facility]  Electronically Signed    PCN/MedQ  DD: 02/18/2007  DT: 02/18/2007  Job #: [id number]

## 2011-04-05 NOTE — Assessment & Plan Note (Signed)
[facility] HEALTHCARE                              CARDIOLOGY OFFICE NOTE   [name], [name]                 MRN:          [id number]  DATE:09/11/2006                            DOB:          Apr 17, 1923    [name] returns today for followup.  She was in the ER, September 09, 2006,  for dizziness.  She gives somewhat of a classic presentation for vertigo  with nausea and room spinning.  She was treated with Antivert.  She seems to  be improving.  She has had a bit of a gait disorder over the last 6 months.  I told her to follow up with Dr. Arvilla Market for this.  She has had a head CT  including on the 23rd, and there was no temporal lobe lesions or acoustic  neuromas.   We initially had been concerned that she was being over medicated for her  blood pressure; however, she has a blood pressure reading at home.  There is  no evidence of postural low systolic pressures.  Her pulse has been okay  except for one reading this morning that was 42.  She does have a sinus  rhythm with a left bundle but has never had high degree AV block.   She has a cardiomyopathy which is presumed to be nonischemic in the 30-35%  range.  Her functional limitations are primarily from her recent vertigo and  arthritis.   She has not had any significant PND, orthopnea, or lower extremity edema.   MEDICATIONS:  As listed in the chart.   She tells me that her blood sugar has been under good control and she has  not had any hypoglycemic events.   I told her that if her pulse is running under 60, she should probably hold  her Coreg for one dose.  Other than this she has been compliant with her  medicines.  The Lipitor was difficult to afford and we have cut her back to  20 mg a day and given her samples.   PHYSICAL EXAMINATION:  SKIN:  Warm and dry.  HEENT:  Normal.  There is no nystagmus.  There is no thyromegaly.  There is  no lymphadenopathy.  LUNGS:  Clear.  Carotids are  normal.  HEART:  There is an S1 S2 with normal heart tones.  ABDOMEN:  Benign.  LOWER EXTREMITIES:  Intact pulses and trace edema.  NEUROLOGIC:  Nonfocal.  Gait is a bit unsteady but she has no focal signs.   IMPRESSION:  1. Stable presyncope and dizziness, most likely classic Meniere disease.      Continue to treat with Antivert and follow up with Dr. Arvilla Market.  2. Cardiomyopathy with left bundle branch block.  Continue current      medications including Coreg, current dose of Lasix at 20 mg and      Atacand.    Follow up with Dr. Arvilla Market for blood sugar control.  Check hemoglobin A1c  quarterly.  Follow rhythm disorder.  The patient will let me know if she has  any excessive bradycardia.  She may need a lower dose of  Coreg.  I will see  her back in 6 months.    ______________________________  [name]. Eden Emms, MD, [facility]    PCN/MedQ  DD: 09/11/2006  DT: 09/12/2006  Job #: [id number]

## 2011-04-05 NOTE — Op Note (Signed)
NAME:  [name], [name]          ACCOUNT NO.:  [id number]   MEDICAL RECORD NO.:  [id number]          PATIENT TYPE:  AMB   LOCATION:  [facility]                         FACILITY:  [facility]   PHYSICIAN:  Timothy E. Earlene Plater, M.D. DATE OF BIRTH:  [birth year]   DATE OF PROCEDURE:  02/06/2006  DATE OF DISCHARGE:                                 OPERATIVE REPORT   PREOPERATIVE DIAGNOSIS:  Rule out temporal arteritis.   POSTOPERATIVE DIAGNOSIS:  Rule out temporal arteritis.   PROCEDURE:  Biopsy, temporal artery, left.   SURGEON:  Timothy E. Earlene Plater, M.D.   ANESTHESIA:  Local with standby.   Ms. Dimon is 9 and has a multitude of medical problems but has recently  had impending blindness, left eye, with swelling and edema of the optic  nerve.  There is a question of temporal arteritis.  For proper management,  her ophthalmologist and caregivers need to have this biopsy done.  She  agrees and understands.  She is seen, identified, and the permit signed.   She is taken to the operating room, placed supine, head turned gently to the  right, and a bit of IV sedation given.  The temporal artery was palpable.  The left ear and scalp were prepped with Betadine.  Then using the sterile  Doppler, the artery was exactly identified, and the skin over that was  anesthetized with 0.25% Marcaine.  A horizontal incision made.  The artery  identified and confirmed with ultrasound.  A section was then removed after  clipping each end.  The proximal end was also tied with a silk.  Specimen in  saline to the lab.  No complications.  Wound dry.  Closed in layers with  Monocryl and Dermabond.  She tolerated it well to the recovery room in good  condition and home, to resume all normal activities and medications.      Timothy E. Earlene Plater, M.D.  Electronically Signed     TED/MEDQ  D:  02/06/2006  T:  02/07/2006  Job:  [id number]   cc:   [name], M.D.  [address]  [address]  [place]  [place] [address]   Vincenza Hews, M.D.  Fax: [phone]   Donia Guiles, M.D.  Fax: [phone]

## 2011-04-05 NOTE — Cardiovascular Report (Signed)
[place] [place]  Patient:    [name], [name]                 MRN: [id number] Proc. Date: 06/05/01 Adm. Date:  [id number] Attending:  Colon Branch CC:         Desma Maxim, M.D.   Cardiac Catheterization  PROCEDURE PERFORMED:  Coronary arteriography.  INDICATIONS:  History of dilated cardiomyopathy with left bundle-branch block, increasing chest pain and exertional dyspnea.  CORONARY ARTERIOGRAPHY:  Left main coronary artery:  The left main coronary artery was normal.  Left anterior descending artery:  The left anterior descending artery was normal.  Circumflex coronary artery:  The circumflex coronary artery was nondominant and it was normal.  Right coronary artery:  The right coronary artery was dominant and it was normal.  RIGHT ANTERIOR OBLIQUE VENTRICULOGRAPHY:  RAO ventriculography revealed mild global hypokinesis.  Ejection fraction was calculated at 47% and there was no significant MR.  There was no gradient across the aortic valve.  Right heart catheterization was done due to the patients exertional dyspnea and history of cardiomyopathy.  Mean right arterial pressure was 9 mmHg.  RV pressure was 50/4.  Pulmonary capillary wedge pressure mean was 12 without a significant V wave.  PA pressure was 26/15 with a mean of 19.  PA saturation was 77%.  Aortic saturation was 97% on room air.  IMPRESSION:  The patient symptoms appear to be noncardiac in etiology. She may have fibromyalgia or significant arthritis.  She is 77 years old and her functional capacity may be decreasing.  Over the years her LV function has actually stabilized to improve.  I do not think the degree of LV dysfunction would explain her exertional dyspnea.  She has no significant coronary disease and her right heart pressures are normal.  She will be discharged later today to follow up with Dr. Donia Guiles.  She has not had significant atrial  fibrillation in quite a long time and I think at this point we can stop her Coumadin therapy.  I asked her to try to take an aspirin a day if this bothers her some and she will call us. DD:  06/05/01 TD:  06/05/01 Job: [id number]

## 2011-04-05 NOTE — Assessment & Plan Note (Signed)
[facility] HEALTHCARE                              CARDIOLOGY OFFICE NOTE   Chaia, Ikard SHANIQUA GUILLOT                 MRN:          [id number]  DATE:08/11/2006                            DOB:          May 03, 1923    [name] returns today for followup.  She has a history of nonischemic  cardiomyopathy with left bundle.  Her echo showed that her EF has gone down  a little bit, and is in the 35 to 40% range.  She had previously been in the  45% range.  However, I do not think her dyspnea is related to this.  Her BNP  was only 42, her electrolytes were otherwise okay.   She does seem to be a bit more bradycardic on Coreg.  I told her to cut back  to half of a 12.5-mg tablet twice a day.   We wrote her for generic, since the regular Coreg was very expensive for  her.  I also told her to possibly cut back on her Atacand if she feels dizzy  when she walks.  However, her blood pressure readings at home do not seem to  be that low.   I think in general today [name] was more concerned about the price of her  medications.  Her diabetes seems to be under reasonable control.   PHYSICAL EXAMINATION:  Her pulse is 60 and regular.  LUNGS:  Clear.  Carotids normal.  HEART:  There is an S1, S2 with normal heart sounds.  ABDOMEN:  Benign.  LOWER EXTREMITIES:  Have intact pulses, no edema.   IMPRESSION:  1. Nonischemic cardiomyopathy.  2. Left bundle branch block.  3. Relative bradycardia on current dose of Coreg, cutting back to 6.25      b.i.d. with a generic prescription.   The patient will cut back on her Atacand as well as needed in regards to her  dizziness.   Her blood sugar seems to be under reasonable control, and we have switched  her to Coreg for decreased insulin resistance.   I do not think there is any indication for right or left heart  catheterization, since her BNP was normal.  She will be continued to be  treated for a presumed nonischemic  cardiomyopathy.   I will see her back in about 4 weeks to assess her symptoms of dizziness and  her pulse.                               [name]. Eden Emms, MD, [facility]    PCN/MedQ  DD:  08/11/2006  DT:  08/13/2006  Job #:  [id number]

## 2011-04-05 NOTE — Assessment & Plan Note (Signed)
[facility] HEALTHCARE                              CARDIOLOGY OFFICE NOTE   [name], [name] [name]                 MRN:          [id number]  DATE:07/18/2006                            DOB:          June 30, 1923    [name] returns today for follow-up.  She has been doing fairly well for  77 years old.  She has a chronic left bundle branch block with PAF.  She has  had what was presumed to be a nonischemic cardiomyopathy for a few years.  Her EF has fluctuated between 30 and 45%.  She has continued exertional  dyspnea.   She has had some problems with her vision as well as diabetes.  I told her  since she is now on two oral hypoglycemics, we would switch her Toprol to  Coreg for its decreased insulin resistance.   The patient has had a hemoglobin A1c in June of 5.6, which was excellent.   I am somewhat concerned about her electrical system, though.  She has had  PAF, a left bundle and a first degree heart block.  We will stop her  digoxin.   At some point in the future we may have to stop her beta blocker.   She has had nonischemic Myoviews in the past and her cardiomyopathy is  presumed to be nonischemic.   Her exertional dyspnea is slightly worse.  I told her that we would check a  BMET, BNP today to see how her filling pressures may be.   She also reports being somewhat anxious and very nervous.  We had given her  Sonata before but it was expensive, and we will check a TSH and T4 to make  sure her thyroid is not acting up.  I suspect that she does have a lot of  other elderly people depending on her and this worries her.   PHYSICAL EXAMINATION:  VITAL SIGNS:  On exam, she is in sinus rhythm at a  rate of 79.  Blood pressure is 130/70.  LUNGS:  Clear.  LUNGS:  Carotids are normal.  CARDIAC:  There is an S1, S2, with an MR murmur.  ABDOMEN:  Benign.  EXTREMITIES:  Lower extremities with intact pulses, no edema.   EKG shows sinus rhythm, left  bundle.   MEDICATIONS:  Listed in the chart.   IMPRESSION:  Stable cardiomyopathy.  We have not assessed her function in  some time.  She will have a 2-D echocardiogram to reassess her left  ventricular function.  This, coupled with her BNP, BUN and creatinine, will  let me know if some of her exertional dyspnea is related to more cardiac  failure, and we will stop her digoxin in the face of electrical  abnormalities and change her beta blocker to Coreg for decreased insulin  resistance.   She will have an adenosine Myoview study to further assess the nature of her  cardiomyopathy.   This will also give [place] an idea of her ejection fraction.  I will see her  back after these tests and see how her exertional dyspnea is.  [name]. Eden Emms, MD, [facility]    PCN/MedQ  DD:  07/18/2006  DT:  07/19/2006  Job #:  [id number]

## 2011-04-05 NOTE — Discharge Summary (Signed)
NAMECRISTY, [name] NO.:  [id number]   MEDICAL RECORD NO.:  [id number]                   PATIENT TYPE:  INP   LOCATION:  4728                                 FACILITY:  [facility]   PHYSICIAN:  [name], M.D.             DATE OF BIRTH:  Jul 20, 1923   DATE OF ADMISSION:  02/10/2003  DATE OF DISCHARGE:  02/13/2003                                 DISCHARGE SUMMARY   DISCHARGE DIAGNOSES:  1. Weakness and hypertension, resolved.  2. History of congestive heart failure.  3. Recurrent urinary tract infections.  4. Nonischemic cardiomyopathy with ejection fraction approximately 20%.  5. Chronic left bundle branch block.  6. History of paroxysmal atrial fibrillation.  7. Hypertension.  8. Sigmoid diverticular disease with cecal polyps 09/2001.  9. Dyspepsia.   DISCHARGE MEDICATIONS:  1. Zestril 20 mg daily.  2. Toprol XL 50 mg daily.  3. Demadex 20 mg every 2 to 3 days.  4. KCl 20 mEq every 2 to 3 days.  5. Lanoxin 0.25 mg daily.  6. Lipitor 20 mg daily.  7. Alprazolam 0.5 mg daily as needed.  8. Tylenol as needed.   CONCLUSION:  [name], M.D., cardiology.   PROCEDURES:  1. Chest x-ray 02/11/2003 showed fibrotic changes in the lung fields, no     active disease.  2. A 12-lead EKG March 25 showed normal sinus rhythm with first degree A-V     block.   LABORATORY DATA:  Brain natriuretic peptide was less than 30.  Sodium 130,  potassium 3.7, chloride 106, CO2 25, glucose 130, BUN 28, creatinine 1.2,  calcium 9.2. WBC 9.6, RBC 4.03, hemoglobin 13.7, hematocrit 38.9, platelets  294.  TSH 1.939.  Digoxin 3.3.  Urinalysis negative.  CK 99, 100, and 88.  CK-MB 2.5, 1.8, and 2.1.  Troponin 0.01 x 3.  Blood cultures were negative.   DISPOSITION:  The patient is being discharged home.   CONDITION ON DISCHARGE:  Stable.   HISTORY OF PRESENT ILLNESS:  The patient is a 77 year old female who  presented to [facility] on 02/10/2003 with complaints  of weakness and low  pressure readings at home for several days.  On the day of admission, the  went to Dr. Roselie Jimenez office for evaluation and was noted to be hypotensive  with blood pressure 84/58.  Pulse oximetry was 97%.  A chest x-ray was done  which revealed small bilateral pleural effusion, questionably secondary to  mild CHF.  The patient was directly admitted to [facility] for further  evaluation and treatment.   HOSPITAL COURSE:  1. WEAKNESS AND HYPOTENSION:  TSH was obtained which was normal.  Digoxin     was noted to be low.  Serial cardiac enzymes were negative for myocardial     infarction.  She was seen by Dr. Eden Jimenez who follows her on an outpatient     basis.  He recommended discontinuing Norvasc and decreasing Zestril  to 20     mg daily.  During her hospital stay, her strength improved, and blood     pressure rose to normal range.  At discharge, her blood pressure is     113/60.  The patient is ambulating independently.  At discharge, she is     instructed to discontinued Norvasc and Cardura and continue Zestril at 20     mg daily.   1. QUESTIONABLE CONGESTIVE HEART FAILURE:  X-ray prior to admission noted     some small bilateral pleural effusions, questionably secondary to CHF.     BNP was done on arrival which was less than 30.  A repeat chest x-ray     revealed fibrotic changes, no active disease.  The patient had no signs     or symptoms of volume overload during hospitalization.   1. HISTORY OF RECURRENT URINARY TRACT INFECTIONS:  The patient was recently     treated for a UTI with Macrobid.  Empiric antibiotics were initiated,     blood cultures x 2 obtained which were negative.  UA on admission was     negative.   1. HISTORY OF NONISCHEMIC CARDIOMYOPATHY WITH EJECTION FRACTION OF     APPROXIMATELY 20%:  Stable during this hospitalization with no complaints     of chest pain, shortness of breath, or dyspnea.       Kristen [place], NP                       [name], M.D.    SJ/MEDQ  D:  02/13/2003  T:  02/14/2003  Job:  [id number]   cc:   [name], M.D.  [address] [place]  [place] [address]  Fax: [phone]   [name], M.D. [facility]

## 2011-04-05 NOTE — Consult Note (Signed)
[name], [name] NO.:  [id number]   MEDICAL RECORD NO.:  [id number]                   PATIENT TYPE:  INP   LOCATION:  4728                                 FACILITY:  [facility]   PHYSICIAN:  Jesse Sans. Wall, M.D. [facility]            DATE OF BIRTH:  1923-07-13   DATE OF CONSULTATION:  02/11/2003  DATE OF DISCHARGE:                                   CONSULTATION   REASON FOR CONSULTATION:  We were asked by Dr. Alric Seton hospital  team to see this patient with a history of nonischemic cardiomyopathy.   HISTORY OF PRESENT ILLNESS:  The patient is a 77 year old, very talkative,  but pleasant lady who has a history of a nonischemic cardiomyopathy.  Her  ejection fraction has been 47% with global hypokinesis.   She had a 2-D echocardiogram today, which is pending.   She was admitted yesterday with progressive weakness, chills, and dysuria  with recurrent urinary tract infection.   She became hypotensive at home with systolic blood pressures in the 60s to  90s.  She has improved markedly with hydration and antibiotics.   ALLERGIES:  She is intolerant of PENICILLIN, SULFA, and TETANUS.   MEDICATIONS:  Her medications at home were:  1. Toprol XL 50 mg a day.  2. Zestril 40 mg a day.  3. Cardura 2 mg q.h.s.  4. Norvasc 5 mg a day.  5. Demadex 20 mg every third day.  6. Potassium 20 mEq every day.  7. Xanax 0.5 mg daily.  8. Digoxin 0.25 mg a day.  9. Tylenol p.r.n.  10.      Lipitor 20 mg a day.   SOCIAL HISTORY:  She lives in [place], [place], with herself.  She  is widowed.  She does not smoke or drink.  She does her own housework.  She  is retired.   REVIEW OF SYSTEMS:  Noncontributory other than in the HPI.   PHYSICAL EXAMINATION:  VITAL SIGNS:  Her blood pressure today is 135/57, her  pulse is 70 and regular, her respiratory rate is 20 and unlabored, her  temperature is 98 degrees, and saturations are 95% on room air.  WEIGHT:   197.4 pounds.  GENERAL APPEARANCE:  She is in no acute distress.  SKIN:  Warm and dry.  HEENT:  Unremarkable.  NECK:  No JVD.  Carotid upstrokes are equal bilaterally without bruits.  The  thyroid is not enlarged.  The trachea is midline.  CARDIOVASCULAR:  Soft systolic murmur along the left sternal border.  LUNGS:  Clear with slight decreased breath sounds in the right base.  ABDOMEN:  Soft.  Good bowel sounds.  There is no hepatosplenomegaly.  EXTREMITIES:  No cyanosis, clubbing, or edema.  Pulses are brisk.  NEUROLOGIC:  Intact.   LABORATORY DATA:  The chest x-ray shows no acute cardiopulmonary disease.  The EKG shows a left bundle branch block with sinus rhythm and  a rate of 91.   Laboratory data is unremarkable, except for a potassium of 3.4.  Her CKs,  MBs, and troponins were negative x 3.  TSH normal.  Digoxin level 0.3.  Blood cultures negative x 2.   ASSESSMENT AND RECOMMENDATIONS:  1. Nonischemic cardiomyopathy, currently stable now that she is rehydrated     and the urinary tract infection is being treated.  Would not make any     changes on a very good designed home program.  2. Weakness and fatigue probably secondary to a urinary tract infection.  3. Hypertension.  This is now resolved with treatment of her admitting     illness.  4. History of paroxysmal atrial fibrillation.  Now off Coumadin for two     years.  5. Chronic left bundle branch block pattern.   Will await the 2-D echocardiogram results.  If we need to lower the dosages  of her medications for avoiding hypotension in the future, we could cut her  Zestril back to 10 mg b.i.d., restart the Toprol, either lower the dose of  Norvasc or discontinue it altogether, or eliminate the Cardura.  I would be  more inclined to eliminate the Cardura than to stop the Norvasc.  I would  continue with digoxin.   Thank you for the consultation.                                               Thomas C. Daleen Squibb, M.D.  [facility]    TCW/MEDQ  D:  02/11/2003  T:  02/13/2003  Job:  [id number]

## 2011-04-25 ENCOUNTER — Other Ambulatory Visit: Payer: Self-pay | Admitting: *Deleted

## 2011-04-25 MED ORDER — CARVEDILOL 25 MG PO TABS: 25.0000 mg | ORAL_TABLET | Freq: Two times a day (BID) | ORAL | Status: DC

## 2011-06-05 ENCOUNTER — Other Ambulatory Visit: Payer: Self-pay | Admitting: Cardiovascular Disease

## 2011-06-24 ENCOUNTER — Encounter: Payer: Self-pay | Admitting: Cardiovascular Disease

## 2011-07-23 ENCOUNTER — Encounter: Payer: Self-pay | Admitting: Cardiovascular Disease

## 2011-07-23 ENCOUNTER — Ambulatory Visit (INDEPENDENT_AMBULATORY_CARE_PROVIDER_SITE_OTHER): Payer: Medicare PPO | Admitting: Cardiovascular Disease

## 2011-07-23 DIAGNOSIS — E785 Hyperlipidemia, unspecified: Secondary | ICD-10-CM

## 2011-07-23 DIAGNOSIS — I447 Left bundle-branch block, unspecified: Secondary | ICD-10-CM

## 2011-07-23 DIAGNOSIS — M129 Arthropathy, unspecified: Secondary | ICD-10-CM

## 2011-07-23 DIAGNOSIS — I4891 Unspecified atrial fibrillation: Secondary | ICD-10-CM

## 2011-07-23 DIAGNOSIS — I509 Heart failure, unspecified: Secondary | ICD-10-CM | POA: Insufficient documentation

## 2011-07-23 DIAGNOSIS — I10 Essential (primary) hypertension: Secondary | ICD-10-CM

## 2011-07-23 DIAGNOSIS — M1990 Unspecified osteoarthritis, unspecified site: Secondary | ICD-10-CM | POA: Insufficient documentation

## 2011-07-23 NOTE — Assessment & Plan Note (Signed)
F/U Dr Jillyn Hidden  Needs xray of right hip  3 pt cane and PT will be arranged

## 2011-07-23 NOTE — Assessment & Plan Note (Signed)
No syncope.  Reflective of DCM  No indication for pacer

## 2011-07-23 NOTE — Assessment & Plan Note (Signed)
Euvolemic  Activity limited by arthritis.  Not a candidate for aggressive w/u  DCM

## 2011-07-23 NOTE — Patient Instructions (Signed)
Your physician wants you to follow-up in:  6 months. You will receive a reminder letter in the mail two months in advance. If you don't receive a letter, please call our office to schedule the follow-up appointment.   

## 2011-07-23 NOTE — Assessment & Plan Note (Signed)
Cholesterol is at goal.  Continue current dose of statin and diet Rx.  No myalgias or side effects.  F/U  LFT's in 6 months. Lab Results  Component Value Date   LDLCALC  Value: 64        Total Cholesterol/HDL:CHD Risk Coronary Heart Disease Risk Table                     Men   Women  1/2 Average Risk   3.4   3.3  Average Risk       5.0   4.4  2 X Average Risk   9.6   7.1  3 X Average Risk  23.4   11.0        Use the calculated Patient Ratio above and the CHD Risk Table to determine the patient's CHD Risk.        ATP III CLASSIFICATION (LDL):  <100     mg/dL   Optimal  100-129  mg/dL   Near or Above                    Optimal  130-159  mg/dL   Borderline  160-189  mg/dL   High  >190     mg/dL   Very High 03/17/2009             

## 2011-07-23 NOTE — Assessment & Plan Note (Signed)
Maint NSR Not a coumadin candidate due to falls

## 2011-07-23 NOTE — Progress Notes (Signed)
[name] is seen today for F/U of PAF, LBBB, DCM with EF 20% no CAD by cath in 2009 S/P  right hip surgery from fall 2011  She has had some continued right hip pain and is taking hydrocodone for it. Her BP has been better on home readings. Average systolics have been 12-140 mmHg after increasing her Benzipril to 40mg  she has not had any SSCP, palpitations, or syncope. She has vertiginous symptoms with change in position She also has stable mild LE edema. Left hip still bothering her. Using 3 pt cane today. F/U Dr Jillyn Hidden  I encouraged her to get  An Xray to make sure it is stable.    Long discussion with Shakesha  She needs to be in assisted living.  She is lonely and unstable on her feet.  She no longer drives due to macular degeneration.    ROS: Denies fever, malais, weight loss, blurry vision, decreased visual acuity, cough, sputum, SOB, hemoptysis, pleuritic pain, palpitaitons, heartburn, abdominal pain, melena, lower extremity edema, claudication, or rash.  All other systems reviewed and negative  General: Affect appropriate Healthy:  appears stated age HEENT: normal Neck supple with no adenopathy JVP normal no bruits no thyromegaly Lungs clear with no wheezing and good diaphragmatic motion Heart:  S1/S2 no murmur,rub, gallop or click PMI normal Abdomen: benighn, BS positve, no tenderness, no AAA no bruit.  No HSM or HJR Distal pulses intact with no bruits No edema Neuro non-focal Skin warm and dry No muscular weakness   Current Outpatient Prescriptions  Medication Sig Dispense Refill  . aspirin 81 MG tablet Take 81 mg by mouth daily.        . benazepril (LOTENSIN) 40 MG tablet Take 40 mg by mouth daily.        . bifidobacterium infantis (ALIGN) capsule Take 1 capsule by mouth daily.        . calcium elemental as carbonate (CALCIUM ANTACID ULTRA) 400 MG tablet Chew 1,000 mg by mouth daily.        . carvedilol (COREG) 25 MG tablet Take 1 tablet (25 mg total) by mouth 2 (two) times  daily.  180 tablet  3  . Cholecalciferol (VITAMIN D3) 2000 UNITS TABS Take 1 tablet by mouth daily.        . Fish Oil OIL Take 1 tablet by mouth daily.        . furosemide (LASIX) 40 MG tablet Take 40 mg by mouth daily.        Marland Kitchen HYDROcodone-acetaminophen (NORCO) 5-325 MG per tablet Take 1 tablet by mouth every 6 (six) hours as needed.        . Magnesium Hydroxide (MILK OF MAGNESIA PO) Take by mouth as needed.        Myriam Forehand FORMULARY Sleep MD as needed       . polyethylene glycol (MIRALAX / GLYCOLAX) packet Take 17 g by mouth as needed.        . potassium chloride SA (K-DUR,KLOR-CON) 20 MEQ tablet TAKE 1 TABLET DAILY  90 tablet  3  . simvastatin (ZOCOR) 40 MG tablet Take 40 mg by mouth at bedtime.          Allergies  Penicillins; Sulfonamide derivatives; and Tetanus toxoid  Electrocardiogram:  01/24/11  NSR 66 LBBB  Assessment and Plan

## 2011-07-23 NOTE — Assessment & Plan Note (Signed)
Well controlled.  Continue current medications and low sodium Dash type diet.    

## 2011-08-16 LAB — DIFFERENTIAL
Eosinophils Absolute: 0
Monocytes Absolute: 2.3 — ABNORMAL HIGH
Neutrophils Relative %: 87 — ABNORMAL HIGH
WBC Morphology: INCREASED

## 2011-08-16 LAB — URINALYSIS, ROUTINE W REFLEX MICROSCOPIC
Bilirubin Urine: NEGATIVE
Glucose, UA: NEGATIVE
Ketones, ur: NEGATIVE
Protein, ur: NEGATIVE

## 2011-08-16 LAB — COMPREHENSIVE METABOLIC PANEL
Alkaline Phosphatase: 34 — ABNORMAL LOW
BUN: 17
CO2: 22
Chloride: 109
Glucose, Bld: 131 — ABNORMAL HIGH
Potassium: 4.5
Total Bilirubin: 1.4 — ABNORMAL HIGH

## 2011-08-16 LAB — URINE MICROSCOPIC-ADD ON

## 2011-08-16 LAB — LIPASE, BLOOD: Lipase: 19

## 2011-08-16 LAB — URINE CULTURE: Colony Count: 10000

## 2011-08-16 LAB — CBC
HCT: 41.3
Hemoglobin: 14.2
WBC: 20.5 — ABNORMAL HIGH

## 2011-08-16 LAB — CULTURE, BLOOD (ROUTINE X 2): Culture: NO GROWTH

## 2011-08-19 LAB — BASIC METABOLIC PANEL
BUN: 12
BUN: 21
Calcium: 8.5
Calcium: 8.7
Chloride: 103
Chloride: 114 — ABNORMAL HIGH
Creatinine, Ser: 1.11
Creatinine, Ser: 1.39 — ABNORMAL HIGH
GFR calc Af Amer: 44 — ABNORMAL LOW
GFR calc Af Amer: 57 — ABNORMAL LOW
GFR calc Af Amer: >60
GFR calc non Af Amer: 36 — ABNORMAL LOW
GFR calc non Af Amer: 60 — ABNORMAL LOW
Glucose, Bld: 95
Potassium: 3.3 — ABNORMAL LOW
Potassium: 3.5
Sodium: 145
Sodium: 146 — ABNORMAL HIGH

## 2011-08-19 LAB — POCT I-STAT, CHEM 8
BUN: 16
Chloride: 108
Creatinine, Ser: 1.1
Glucose, Bld: 135 — ABNORMAL HIGH
Potassium: 4
Sodium: 140

## 2011-08-19 LAB — POCT CARDIAC MARKERS
CKMB, poc: <1 — ABNORMAL LOW
Troponin i, poc: <0.05

## 2011-08-19 LAB — URINALYSIS, ROUTINE W REFLEX MICROSCOPIC
Bilirubin Urine: NEGATIVE
Glucose, UA: NEGATIVE
Ketones, ur: NEGATIVE
Nitrite: NEGATIVE
Specific Gravity, Urine: >1.046 — ABNORMAL HIGH
pH: 6.5

## 2011-08-19 LAB — GLUCOSE, CAPILLARY
Glucose-Capillary: 102 — ABNORMAL HIGH
Glucose-Capillary: 109 — ABNORMAL HIGH
Glucose-Capillary: 110 — ABNORMAL HIGH
Glucose-Capillary: 112 — ABNORMAL HIGH
Glucose-Capillary: 114 — ABNORMAL HIGH
Glucose-Capillary: 116 — ABNORMAL HIGH
Glucose-Capillary: 125 — ABNORMAL HIGH
Glucose-Capillary: 43 — ABNORMAL LOW
Glucose-Capillary: 50 — ABNORMAL LOW
Glucose-Capillary: 79
Glucose-Capillary: 96

## 2011-08-19 LAB — CARDIAC PANEL(CRET KIN+CKTOT+MB+TROPI)
Relative Index: INVALID
Troponin I: 0.01

## 2011-08-19 LAB — DIFFERENTIAL
Lymphocytes Relative: 1 — ABNORMAL LOW
Lymphs Abs: 0.2 — ABNORMAL LOW
Monocytes Relative: 3
Neutro Abs: 16.8 — ABNORMAL HIGH
Neutrophils Relative %: 95 — ABNORMAL HIGH

## 2011-08-19 LAB — CBC
HCT: 37.3
Hemoglobin: 11.3 — ABNORMAL LOW
MCHC: 35
MCV: 94.3
MCV: 95.7
MCV: 96.5
Platelets: 125 — ABNORMAL LOW
Platelets: 150
Platelets: 186
RBC: 3.51 — ABNORMAL LOW
RBC: 4.55
RDW: 13.2
RDW: 13.4
WBC: 11.5 — ABNORMAL HIGH
WBC: 17.6 — ABNORMAL HIGH

## 2011-08-19 LAB — CK TOTAL AND CKMB (NOT AT ARMC)
CK, MB: 1.1
Relative Index: INVALID
Total CK: 37

## 2011-08-19 LAB — CULTURE, BLOOD (ROUTINE X 2): Culture: NO GROWTH

## 2011-08-19 LAB — HEPATIC FUNCTION PANEL
Indirect Bilirubin: 1.4 — ABNORMAL HIGH
Total Protein: 5.6 — ABNORMAL LOW

## 2011-08-19 LAB — AMYLASE: Amylase: 213 — ABNORMAL HIGH

## 2011-08-19 LAB — CLOSTRIDIUM DIFFICILE EIA

## 2011-08-19 LAB — URINE CULTURE

## 2011-08-19 LAB — LIPASE, BLOOD: Lipase: 17

## 2011-08-20 LAB — DIFFERENTIAL
Basophils Absolute: 0
Basophils Absolute: 0 10*3/uL (ref 0.0–0.1)
Basophils Absolute: 0 10*3/uL (ref 0.0–0.1)
Basophils Relative: 0
Basophils Relative: 0 % (ref 0–1)
Basophils Relative: 0 % (ref 0–1)
Basophils Relative: 0 % (ref 0–1)
Eosinophils Absolute: 0.1
Eosinophils Absolute: 0 10*3/uL (ref 0.0–0.7)
Eosinophils Absolute: 0 10*3/uL (ref 0.0–0.7)
Eosinophils Relative: 0 % (ref 0–5)
Lymphocytes Relative: 9 % — ABNORMAL LOW (ref 12–46)
Lymphs Abs: 0.9 10*3/uL (ref 0.7–4.0)
Monocytes Absolute: 0.1
Monocytes Absolute: 0.6 10*3/uL (ref 0.1–1.0)
Monocytes Absolute: 1 10*3/uL (ref 0.1–1.0)
Monocytes Relative: 7 % (ref 3–12)
Monocytes Relative: 7 % (ref 3–12)
Neutro Abs: 5.2
Neutro Abs: 8.5 10*3/uL — ABNORMAL HIGH (ref 1.7–7.7)
Neutrophils Relative %: 80 % — ABNORMAL HIGH (ref 43–77)

## 2011-08-20 LAB — COMPREHENSIVE METABOLIC PANEL
AST: 15
AST: 28 U/L (ref 0–37)
Albumin: 2.7 — ABNORMAL LOW
Albumin: 3.3 g/dL — ABNORMAL LOW (ref 3.5–5.2)
Alkaline Phosphatase: 40
Alkaline Phosphatase: 38 U/L — ABNORMAL LOW (ref 39–117)
BUN: 14
BUN: 15 mg/dL (ref 6–23)
Chloride: 112
Chloride: 111 mEq/L (ref 96–112)
Creatinine, Ser: 0.81
GFR calc Af Amer: >60
GFR calc Af Amer: >60 mL/min (ref >60)
Potassium: 4
Potassium: 3.9 mEq/L (ref 3.5–5.1)
Total Bilirubin: 1.2 mg/dL (ref 0.3–1.2)
Total Protein: 5.5 — ABNORMAL LOW

## 2011-08-20 LAB — BASIC METABOLIC PANEL
BUN: 7
CO2: 23
CO2: 24
Calcium: 7.7 — ABNORMAL LOW
Calcium: 8.7
Calcium: 8.8
Chloride: 110
Chloride: 110
Chloride: 111 mEq/L (ref 96–112)
Creatinine, Ser: 0.71
Creatinine, Ser: 0.79
GFR calc Af Amer: 59 — ABNORMAL LOW
GFR calc Af Amer: >60
GFR calc non Af Amer: >60
GFR calc non Af Amer: 55 mL/min — ABNORMAL LOW (ref >60)
Glucose, Bld: 108 — ABNORMAL HIGH
Glucose, Bld: 120 — ABNORMAL HIGH
Potassium: 3.7
Potassium: 3.5 mEq/L (ref 3.5–5.1)
Sodium: 141
Sodium: 141 mEq/L (ref 135–145)

## 2011-08-20 LAB — URINE MICROSCOPIC-ADD ON

## 2011-08-20 LAB — GLUCOSE, CAPILLARY
Glucose-Capillary: 100 — ABNORMAL HIGH
Glucose-Capillary: 103 — ABNORMAL HIGH
Glucose-Capillary: 104 — ABNORMAL HIGH
Glucose-Capillary: 105 — ABNORMAL HIGH
Glucose-Capillary: 106 — ABNORMAL HIGH
Glucose-Capillary: 113 — ABNORMAL HIGH
Glucose-Capillary: 118 — ABNORMAL HIGH
Glucose-Capillary: 120 — ABNORMAL HIGH
Glucose-Capillary: 135 — ABNORMAL HIGH
Glucose-Capillary: 141 — ABNORMAL HIGH
Glucose-Capillary: 142 — ABNORMAL HIGH
Glucose-Capillary: 145 — ABNORMAL HIGH
Glucose-Capillary: 150 — ABNORMAL HIGH
Glucose-Capillary: 150 — ABNORMAL HIGH
Glucose-Capillary: 153 — ABNORMAL HIGH
Glucose-Capillary: 161 — ABNORMAL HIGH
Glucose-Capillary: 163 — ABNORMAL HIGH
Glucose-Capillary: 66 — ABNORMAL LOW
Glucose-Capillary: 82
Glucose-Capillary: 93
Glucose-Capillary: 97
Glucose-Capillary: 98
Glucose-Capillary: 98
Glucose-Capillary: 99
Glucose-Capillary: 107 mg/dL — ABNORMAL HIGH (ref 70–99)
Glucose-Capillary: 116 mg/dL — ABNORMAL HIGH (ref 70–99)
Glucose-Capillary: 127 mg/dL — ABNORMAL HIGH (ref 70–99)
Glucose-Capillary: 131 mg/dL — ABNORMAL HIGH (ref 70–99)
Glucose-Capillary: 77 mg/dL (ref 70–99)
Glucose-Capillary: 82 mg/dL (ref 70–99)
Glucose-Capillary: 88 mg/dL (ref 70–99)

## 2011-08-20 LAB — CBC
HCT: 35 — ABNORMAL LOW
HCT: 37.3
HCT: 36.3 % (ref 36.0–46.0)
HCT: 40.5 % (ref 36.0–46.0)
Hemoglobin: 11.1 — ABNORMAL LOW
Hemoglobin: 11.9 — ABNORMAL LOW
Hemoglobin: 12.9
Hemoglobin: 14.2
Hemoglobin: 12.4 g/dL (ref 12.0–15.0)
MCHC: 34
MCV: 95.1
MCV: 96.8
Platelets: 107 — ABNORMAL LOW
Platelets: 126 — ABNORMAL LOW
Platelets: 143 10*3/uL — ABNORMAL LOW (ref 150–400)
Platelets: 173 10*3/uL (ref 150–400)
RBC: 3.32 — ABNORMAL LOW
RBC: 3.92
RBC: 3.29 MIL/uL — ABNORMAL LOW (ref 3.87–5.11)
RDW: 13.8
RDW: 13.9
WBC: 12 — ABNORMAL HIGH
WBC: 14.6 — ABNORMAL HIGH
WBC: 6.7
WBC: 7.9
WBC: 10.6 10*3/uL — ABNORMAL HIGH (ref 4.0–10.5)
WBC: 15.9 10*3/uL — ABNORMAL HIGH (ref 4.0–10.5)
WBC: 22 10*3/uL — ABNORMAL HIGH (ref 4.0–10.5)

## 2011-08-20 LAB — POCT CARDIAC MARKERS
Myoglobin, poc: 70.6 ng/mL (ref 12–200)
Troponin i, poc: <0.05 ng/mL (ref 0.00–0.09)

## 2011-08-20 LAB — CARDIAC PANEL(CRET KIN+CKTOT+MB+TROPI)
CK, MB: 0.9
CK, MB: 1.2
Total CK: 32
Troponin I: 0.01
Troponin I: 0.02

## 2011-08-20 LAB — CULTURE, BLOOD (ROUTINE X 2)
Culture: NO GROWTH
Culture: NO GROWTH

## 2011-08-20 LAB — URINALYSIS, ROUTINE W REFLEX MICROSCOPIC
Hgb urine dipstick: NEGATIVE
Protein, ur: 30 mg/dL — AB
Urobilinogen, UA: 0.2 mg/dL (ref 0.0–1.0)

## 2011-08-20 LAB — B-NATRIURETIC PEPTIDE (CONVERTED LAB)
Pro B Natriuretic peptide (BNP): 227 — ABNORMAL HIGH
Pro B Natriuretic peptide (BNP): 235 — ABNORMAL HIGH
Pro B Natriuretic peptide (BNP): 506 — ABNORMAL HIGH

## 2011-08-20 LAB — EXPECTORATED SPUTUM ASSESSMENT W GRAM STAIN, RFLX TO RESP C

## 2011-08-20 LAB — CK TOTAL AND CKMB (NOT AT ARMC)
CK, MB: 1.2
Relative Index: INVALID
Total CK: 42

## 2011-08-20 LAB — AMYLASE: Amylase: 57 U/L (ref 27–131)

## 2011-08-20 LAB — VANCOMYCIN, RANDOM: Vancomycin Rm: 8.1

## 2011-08-20 LAB — VANCOMYCIN, TROUGH: Vancomycin Tr: 13.9

## 2011-08-21 LAB — BASIC METABOLIC PANEL
BUN: 11
BUN: 12
CO2: 27
Calcium: 8.8
Calcium: 9
Creatinine, Ser: 0.94
GFR calc non Af Amer: 60 — ABNORMAL LOW
Glucose, Bld: 86
Glucose, Bld: 90
Potassium: 3.3 — ABNORMAL LOW
Sodium: 142

## 2011-08-21 LAB — COMPREHENSIVE METABOLIC PANEL
ALT: 16
AST: 21
Albumin: 3.8
CO2: 29
Calcium: 9.1
Creatinine, Ser: 1.05
GFR calc Af Amer: >60
GFR calc non Af Amer: 50 — ABNORMAL LOW
Sodium: 141
Total Protein: 7.2

## 2011-08-21 LAB — GLUCOSE, CAPILLARY
Glucose-Capillary: 106 — ABNORMAL HIGH
Glucose-Capillary: 109 — ABNORMAL HIGH
Glucose-Capillary: 146 — ABNORMAL HIGH
Glucose-Capillary: 90

## 2011-08-21 LAB — CBC
MCHC: 34.2
MCHC: 35
MCV: 96.8
Platelets: 150
Platelets: 192
RBC: 4.42
RDW: 13
RDW: 13.7

## 2011-08-21 LAB — POCT I-STAT 3, ART BLOOD GAS (G3+)
pCO2 arterial: 41.7
pH, Arterial: 7.431 — ABNORMAL HIGH
pO2, Arterial: 69 — ABNORMAL LOW

## 2011-08-21 LAB — POCT I-STAT 3, VENOUS BLOOD GAS (G3P V)
O2 Saturation: 66
TCO2: 27
pH, Ven: 7.373 — ABNORMAL HIGH

## 2011-08-21 LAB — PROTIME-INR: Prothrombin Time: 13.8

## 2011-08-21 LAB — TROPONIN I: Troponin I: 0.01

## 2011-08-29 ENCOUNTER — Other Ambulatory Visit: Payer: Self-pay | Admitting: Family Medicine

## 2011-08-29 ENCOUNTER — Ambulatory Visit
Admission: RE | Admit: 2011-08-29 | Discharge: 2011-08-29 | Disposition: A | Discharge status: Home / Self Care | Payer: Medicare PPO | Source: Ambulatory Visit | Attending: Family Medicine | Admitting: Family Medicine

## 2011-08-29 DIAGNOSIS — R05 Cough: Secondary | ICD-10-CM

## 2011-08-29 LAB — DIFFERENTIAL
Basophils Absolute: 0
Basophils Absolute: 0
Basophils Relative: 0
Eosinophils Absolute: 0.4
Eosinophils Relative: 3
Eosinophils Relative: 4
Lymphocytes Relative: 14
Lymphocytes Relative: 30
Lymphs Abs: 1.6
Lymphs Abs: 2.6
Monocytes Absolute: 0.8 — ABNORMAL HIGH
Monocytes Absolute: 1.2 — ABNORMAL HIGH
Monocytes Relative: 7
Neutro Abs: 8.5 — ABNORMAL HIGH
Neutrophils Relative %: 75

## 2011-08-29 LAB — URINALYSIS, MICROSCOPIC ONLY
Bilirubin Urine: NEGATIVE
Glucose, UA: NEGATIVE
Ketones, ur: NEGATIVE
Nitrite: NEGATIVE
Protein, ur: NEGATIVE
Specific Gravity, Urine: 1.022
Urobilinogen, UA: 0.2
pH: 6

## 2011-08-29 LAB — CBC
HCT: 36.9
HCT: 38.6
HCT: 42.7
Hemoglobin: 13
Hemoglobin: 14.8
MCHC: 34.6
MCHC: 35.5
MCV: 93.4
MCV: 94.3
Platelets: 177
Platelets: 227
RBC: 4.53
RDW: 13.4
RDW: 13.5
RDW: 13.6
WBC: 11.3 — ABNORMAL HIGH

## 2011-08-29 LAB — COMPREHENSIVE METABOLIC PANEL
Albumin: 3.4 — ABNORMAL LOW
Alkaline Phosphatase: 62
BUN: 14
BUN: 16
Calcium: 9.4
Creatinine, Ser: 0.89
Creatinine, Ser: 0.92
Glucose, Bld: 157 — ABNORMAL HIGH
Potassium: 3.7
Total Protein: 6.3
Total Protein: 6.8

## 2011-08-29 LAB — COMPREHENSIVE METABOLIC PANEL WITH GFR
ALT: 24
AST: 24
Albumin: 3.4 — ABNORMAL LOW
CO2: 26
Calcium: 9
Chloride: 108
GFR calc Af Amer: >60
GFR calc non Af Amer: 58 — ABNORMAL LOW
Sodium: 142
Total Bilirubin: 0.7

## 2011-08-29 LAB — BASIC METABOLIC PANEL
CO2: 28
Chloride: 109
GFR calc Af Amer: >60
Sodium: 144

## 2011-08-29 LAB — B-NATRIURETIC PEPTIDE (CONVERTED LAB): Pro B Natriuretic peptide (BNP): 240 — ABNORMAL HIGH

## 2011-08-29 LAB — CK TOTAL AND CKMB (NOT AT ARMC)
CK, MB: 2.5
CK, MB: 2.5
Relative Index: INVALID
Total CK: 54
Total CK: 70

## 2011-08-29 LAB — CARDIAC PANEL(CRET KIN+CKTOT+MB+TROPI): CK, MB: 2.1

## 2011-08-29 LAB — TROPONIN I
Troponin I: 0.02
Troponin I: 0.04

## 2011-08-29 LAB — LIPASE, BLOOD: Lipase: 16

## 2011-09-11 ENCOUNTER — Telehealth: Payer: Self-pay | Admitting: Cardiovascular Disease

## 2011-09-11 NOTE — Telephone Encounter (Signed)
Pt needs call to [employer] to auth home health care for 9-7 thru 10-19 for 16 visits, pls call pt  [phone]

## 2011-12-10 ENCOUNTER — Other Ambulatory Visit: Payer: Self-pay | Admitting: Cardiovascular Disease

## 2011-12-10 MED ORDER — SIMVASTATIN 40 MG PO TABS: 40.0000 mg | ORAL_TABLET | Freq: Every day | ORAL | Status: DC

## 2011-12-10 MED ORDER — BENAZEPRIL HCL 40 MG PO TABS: 40.0000 mg | ORAL_TABLET | Freq: Every day | ORAL | Status: DC

## 2012-01-31 ENCOUNTER — Ambulatory Visit (INDEPENDENT_AMBULATORY_CARE_PROVIDER_SITE_OTHER): Payer: Medicare PPO | Admitting: Cardiovascular Disease

## 2012-01-31 ENCOUNTER — Encounter: Payer: Self-pay | Admitting: Cardiovascular Disease

## 2012-01-31 VITALS — BP 120/68 | HR 63 | Wt 200.0 lb

## 2012-01-31 DIAGNOSIS — I509 Heart failure, unspecified: Secondary | ICD-10-CM

## 2012-01-31 DIAGNOSIS — I10 Essential (primary) hypertension: Secondary | ICD-10-CM

## 2012-01-31 DIAGNOSIS — I447 Left bundle-branch block, unspecified: Secondary | ICD-10-CM

## 2012-01-31 MED ORDER — LOSARTAN POTASSIUM 25 MG PO TABS: 25.0000 mg | ORAL_TABLET | Freq: Every day | ORAL | Status: DC

## 2012-01-31 NOTE — Assessment & Plan Note (Signed)
LBBB chronic with relatevely slow rate and LAD will lower beta blocker as she is at increased risk of AV block with advanced age

## 2012-01-31 NOTE — Assessment & Plan Note (Signed)
Cholesterol is at goal.  Continue current dose of statin and diet Rx.  No myalgias or side effects.  F/U  LFT's in 6 months. Lab Results  Component Value Date   LDLCALC  Value: 64        Total Cholesterol/HDL:CHD Risk Coronary Heart Disease Risk Table                     Men   Women  1/2 Average Risk   3.4   3.3  Average Risk       5.0   4.4  2 X Average Risk   9.6   7.1  3 X Average Risk  23.4   11.0        Use the calculated Patient Ratio above and the CHD Risk Table to determine the patient's CHD Risk.        ATP III CLASSIFICATION (LDL):  <100     mg/dL   Optimal  100-129  mg/dL   Near or Above                    Optimal  130-159  mg/dL   Borderline  160-189  mg/dL   High  >190     mg/dL   Very High 03/17/2009             

## 2012-01-31 NOTE — Patient Instructions (Signed)
Your physician wants you to follow-up in:  6 MONTHS WITH DR Haywood Filler will receive a reminder letter in the mail two months in advance. If you don't receive a letter, please call our office to schedule the follow-up appointment. Your physician has recommended you make the following change in your medication: STOP BENAZEPRIL AND START  LOSARTAN 25 MG 1 EVERY DAY AND DECREASE CARVEDILOL TO 12.5 MG TWICE DAILY

## 2012-01-31 NOTE — Assessment & Plan Note (Signed)
Stable euvolemic  Change ACE to ARB for cough and sinus congestion

## 2012-01-31 NOTE — Assessment & Plan Note (Signed)
Well controlled.  Continue current medications and low sodium Dash type diet.    

## 2012-01-31 NOTE — Assessment & Plan Note (Signed)
Discussed low carb diet.  Target hemoglobin A1c is 6.5 or less.  Continue current medications.  

## 2012-01-31 NOTE — Progress Notes (Signed)
[name] is seen today for F/U of PAF, LBBB, DCM with EF 20% no CAD by cath in 2009 S/P right hip surgery from fall 2011 She has had some continued right hip pain and is taking hydrocodone for it. Her BP has been better on home readings. Average systolics have been 12-140 mmHg after increasing her Benzipril to 40mg  she has not had any SSCP, palpitations, or syncope. She has vertiginous symptoms with change in position She also has stable mild LE edema.   Reviewed home BP records and they are adequate  Left hip still bothering her. Using 3 pt cane today. F/U Dr Jillyn Hidden I encouraged her to get An Xray to make sure it is stable.  Long discussion with Genell She needs to be in assisted living. She is lonely and unstable on her feet. She no longer drives due to macular degeneration.   ROS: Denies fever, malais, weight loss, blurry vision, decreased visual acuity, cough, sputum, SOB, hemoptysis, pleuritic pain, palpitaitons, heartburn, abdominal pain, melena, lower extremity edema, claudication, or rash.  All other systems reviewed and negative  General: Affect appropriate Healthy:  appears stated age HEENT: normal Neck supple with no adenopathy JVP normal no bruits no thyromegaly Lungs clear with no wheezing and good diaphragmatic motion Heart:  S1/S2 no murmur, no rub, gallop or click PMI normal Abdomen: benighn, BS positve, no tenderness, no AAA no bruit.  No HSM or HJR Distal pulses intact with no bruits No edema Neuro non-focal Skin warm and dry No muscular weakness   Current Outpatient Prescriptions  Medication Sig Dispense Refill  . aspirin 81 MG tablet Take 81 mg by mouth daily.        . benazepril (LOTENSIN) 40 MG tablet Take 1 tablet (40 mg total) by mouth daily.  90 tablet  2  . carvedilol (COREG) 25 MG tablet Take 1 tablet (25 mg total) by mouth 2 (two) times daily.  180 tablet  3  . furosemide (LASIX) 40 MG tablet Take 40 mg by mouth daily.        Marland Kitchen loperamide (IMODIUM A-D) 2  MG tablet Take 2 mg by mouth as needed.      Myriam Forehand FORMULARY Sleep MD as needed       . potassium chloride SA (K-DUR,KLOR-CON) 20 MEQ tablet TAKE 1 TABLET DAILY  90 tablet  3  . sertraline (ZOLOFT) 50 MG tablet Take 50 mg by mouth daily.      . simvastatin (ZOCOR) 40 MG tablet Take 1 tablet (40 mg total) by mouth at bedtime.  90 tablet  2    Allergies  Penicillins; Sulfonamide derivatives; and Tetanus toxoid  Electrocardiogram: 01/24/11  NSR 68  LBBB no change  Today NSR 63 LBBB LAD QRS a bit wider at 162 rate 63  Assessment and Plan

## 2012-05-04 ENCOUNTER — Encounter (HOSPITAL_COMMUNITY): Payer: Self-pay | Admitting: Internal Medicine

## 2012-05-04 ENCOUNTER — Inpatient Hospital Stay (HOSPITAL_COMMUNITY)
Admission: EM | Admit: 2012-05-04 | Discharge: 2012-05-07 | DRG: 470 | Disposition: A | Discharge status: Skilled Nursing Facility | Payer: Medicare PPO | Attending: Internal Medicine | Admitting: Internal Medicine

## 2012-05-04 ENCOUNTER — Emergency Department (HOSPITAL_COMMUNITY): Payer: Medicare PPO

## 2012-05-04 DIAGNOSIS — I4891 Unspecified atrial fibrillation: Secondary | ICD-10-CM | POA: Diagnosis present

## 2012-05-04 DIAGNOSIS — E1149 Type 2 diabetes mellitus with other diabetic neurological complication: Secondary | ICD-10-CM | POA: Diagnosis present

## 2012-05-04 DIAGNOSIS — K7689 Other specified diseases of liver: Secondary | ICD-10-CM

## 2012-05-04 DIAGNOSIS — I10 Essential (primary) hypertension: Secondary | ICD-10-CM

## 2012-05-04 DIAGNOSIS — E119 Type 2 diabetes mellitus without complications: Secondary | ICD-10-CM

## 2012-05-04 DIAGNOSIS — I2589 Other forms of chronic ischemic heart disease: Secondary | ICD-10-CM | POA: Diagnosis present

## 2012-05-04 DIAGNOSIS — Z7982 Long term (current) use of aspirin: Secondary | ICD-10-CM

## 2012-05-04 DIAGNOSIS — I509 Heart failure, unspecified: Secondary | ICD-10-CM | POA: Diagnosis present

## 2012-05-04 DIAGNOSIS — J4489 Other specified chronic obstructive pulmonary disease: Secondary | ICD-10-CM | POA: Diagnosis present

## 2012-05-04 DIAGNOSIS — G629 Polyneuropathy, unspecified: Secondary | ICD-10-CM

## 2012-05-04 DIAGNOSIS — Y92009 Unspecified place in unspecified non-institutional (private) residence as the place of occurrence of the external cause: Secondary | ICD-10-CM

## 2012-05-04 DIAGNOSIS — F329 Major depressive disorder, single episode, unspecified: Secondary | ICD-10-CM

## 2012-05-04 DIAGNOSIS — R0602 Shortness of breath: Secondary | ICD-10-CM

## 2012-05-04 DIAGNOSIS — S72009A Fracture of unspecified part of neck of unspecified femur, initial encounter for closed fracture: Principal | ICD-10-CM | POA: Diagnosis present

## 2012-05-04 DIAGNOSIS — E1142 Type 2 diabetes mellitus with diabetic polyneuropathy: Secondary | ICD-10-CM | POA: Diagnosis present

## 2012-05-04 DIAGNOSIS — K8020 Calculus of gallbladder without cholecystitis without obstruction: Secondary | ICD-10-CM

## 2012-05-04 DIAGNOSIS — Y93E9 Activity, other interior property and clothing maintenance: Secondary | ICD-10-CM

## 2012-05-04 DIAGNOSIS — E1140 Type 2 diabetes mellitus with diabetic neuropathy, unspecified: Secondary | ICD-10-CM | POA: Diagnosis present

## 2012-05-04 DIAGNOSIS — I447 Left bundle-branch block, unspecified: Secondary | ICD-10-CM | POA: Diagnosis present

## 2012-05-04 DIAGNOSIS — W010XXA Fall on same level from slipping, tripping and stumbling without subsequent striking against object, initial encounter: Secondary | ICD-10-CM | POA: Diagnosis present

## 2012-05-04 DIAGNOSIS — Z9889 Other specified postprocedural states: Secondary | ICD-10-CM

## 2012-05-04 DIAGNOSIS — K219 Gastro-esophageal reflux disease without esophagitis: Secondary | ICD-10-CM | POA: Diagnosis present

## 2012-05-04 DIAGNOSIS — Z79899 Other long term (current) drug therapy: Secondary | ICD-10-CM

## 2012-05-04 DIAGNOSIS — I5022 Chronic systolic (congestive) heart failure: Secondary | ICD-10-CM

## 2012-05-04 DIAGNOSIS — E785 Hyperlipidemia, unspecified: Secondary | ICD-10-CM | POA: Diagnosis present

## 2012-05-04 DIAGNOSIS — M1990 Unspecified osteoarthritis, unspecified site: Secondary | ICD-10-CM

## 2012-05-04 DIAGNOSIS — J449 Chronic obstructive pulmonary disease, unspecified: Secondary | ICD-10-CM

## 2012-05-04 DIAGNOSIS — D62 Acute posthemorrhagic anemia: Secondary | ICD-10-CM | POA: Diagnosis not present

## 2012-05-04 DIAGNOSIS — F3289 Other specified depressive episodes: Secondary | ICD-10-CM | POA: Diagnosis present

## 2012-05-04 DIAGNOSIS — S72002A Fracture of unspecified part of neck of left femur, initial encounter for closed fracture: Secondary | ICD-10-CM | POA: Diagnosis present

## 2012-05-04 DIAGNOSIS — S72009B Fracture of unspecified part of neck of unspecified femur, initial encounter for open fracture type I or II: Secondary | ICD-10-CM

## 2012-05-04 LAB — COMPREHENSIVE METABOLIC PANEL
AST: 19 U/L (ref 0–37)
Albumin: 3.4 g/dL — ABNORMAL LOW (ref 3.5–5.2)
BUN: 15 mg/dL (ref 6–23)
Calcium: 9.2 mg/dL (ref 8.4–10.5)
Chloride: 104 mEq/L (ref 96–112)
Creatinine, Ser: 0.81 mg/dL (ref 0.50–1.10)
Total Protein: 6.8 g/dL (ref 6.0–8.3)

## 2012-05-04 LAB — CBC
HCT: 38.9 % (ref 36.0–46.0)
MCH: 32.8 pg (ref 26.0–34.0)
MCHC: 34.4 g/dL (ref 30.0–36.0)
RDW: 12.4 % (ref 11.5–15.5)

## 2012-05-04 LAB — URINALYSIS, ROUTINE W REFLEX MICROSCOPIC
Glucose, UA: NEGATIVE mg/dL
Hgb urine dipstick: NEGATIVE
Leukocytes, UA: NEGATIVE
Specific Gravity, Urine: 1.016 (ref 1.005–1.030)
pH: 7.5 (ref 5.0–8.0)

## 2012-05-04 LAB — DIFFERENTIAL
Basophils Absolute: 0 10*3/uL (ref 0.0–0.1)
Basophils Relative: 0 % (ref 0–1)
Eosinophils Absolute: 0.5 10*3/uL (ref 0.0–0.7)
Eosinophils Relative: 5 % (ref 0–5)
Monocytes Absolute: 0.9 10*3/uL (ref 0.1–1.0)
Monocytes Relative: 8 % (ref 3–12)
Neutro Abs: 7.8 10*3/uL — ABNORMAL HIGH (ref 1.7–7.7)

## 2012-05-04 LAB — POCT I-STAT TROPONIN I

## 2012-05-04 LAB — PROTIME-INR
INR: 1 (ref 0.00–1.49)
Prothrombin Time: 13.4 seconds (ref 11.6–15.2)

## 2012-05-04 MED ORDER — SODIUM CHLORIDE 0.9 % IV SOLN
INTRAVENOUS | Status: DC
  Administered 2012-05-04: via INTRAVENOUS

## 2012-05-04 MED ORDER — ONDANSETRON HCL 4 MG/2ML IJ SOLN: 4.0000 mg | Freq: Four times a day (QID) | INTRAMUSCULAR | Status: DC | PRN

## 2012-05-04 MED ORDER — MORPHINE SULFATE 4 MG/ML IJ SOLN
4.0000 mg | INTRAMUSCULAR | Status: DC | PRN
  Administered 2012-05-04 – 2012-05-05 (×5): 4 mg via INTRAVENOUS
  Filled 2012-05-04 (×6): qty 1

## 2012-05-04 MED ORDER — SODIUM CHLORIDE 0.9 % IV BOLUS (SEPSIS)
500.0000 mL | Freq: Once | INTRAVENOUS | Status: AC
  Administered 2012-05-04: 500 mL via INTRAVENOUS

## 2012-05-04 MED ORDER — FENTANYL CITRATE 0.05 MG/ML IJ SOLN
50.0000 ug | Freq: Once | INTRAMUSCULAR | Status: AC
  Administered 2012-05-04: 50 ug via INTRAVENOUS
  Filled 2012-05-04: qty 2

## 2012-05-04 NOTE — ED Notes (Signed)
Pt was given a total of of Fentanyl IV en route to ED.

## 2012-05-04 NOTE — ED Provider Notes (Signed)
History     CSN: [id number]  Arrival date & time 05/04/12  1952   First MD Initiated Contact with Patient 05/04/12 2037      Chief Complaint  Patient presents with  . Fall  . Hip Pain    (Consider location/radiation/quality/duration/timing/severity/associated sxs/prior treatment) HPI Pt tripped and fell onto L hip. +shortening and external rotation. Denied head or neck trauma. No numbness. Pt had R hip replaced in the past by Dr Shelle Iron.  Past Medical History  Diagnosis Date  . Cholelithiasis   . Other postprocedural status   . Atrial fibrillation   . Hepatic cyst   . GERD (gastroesophageal reflux disease)   . COPD (chronic obstructive pulmonary disease)   . LBBB (left bundle branch block)   . Diabetes mellitus   . Hyperlipidemia   . Hypertension   . Ischemic cardiomyopathy   . Dizziness     with some gait disability  . Peripheral edema     Past Surgical History  Procedure Date  . Wrist surgery   . Dilation and curettage     Family History  Problem Relation Age of Onset  . Heart failure Brother     died age 42    History  Substance Use Topics  . Smoking status: Never Smoker   . Smokeless tobacco: Not on file  . Alcohol Use: No    OB History    Grav Para Term Preterm Abortions TAB SAB Ect Mult Living                  Review of Systems  Constitutional: Negative for fever and chills.  HENT: Negative for neck pain.   Respiratory: Negative for shortness of breath.   Cardiovascular: Negative for chest pain.  Gastrointestinal: Negative for nausea, vomiting, abdominal pain and diarrhea.  Genitourinary: Negative for dysuria.  Musculoskeletal: Negative for back pain.  Skin: Negative for rash and wound.  Neurological: Negative for syncope, weakness, numbness and headaches.    Allergies  Penicillins; Sulfonamide derivatives; and Tetanus toxoid  Home Medications   Current Outpatient Rx  Name Route Sig Dispense Refill  . ASPIRIN 81 MG PO TABS Oral Take  81 mg by mouth daily.      Marland Kitchen BENAZEPRIL HCL 20 MG PO TABS Oral Take 20 mg by mouth daily.    Marland Kitchen CARVEDILOL 25 MG PO TABS Oral Take 25 mg by mouth 2 (two) times daily with a meal.    . FUROSEMIDE 40 MG PO TABS Oral Take 40 mg by mouth daily.      Marland Kitchen LOPERAMIDE HCL 2 MG PO TABS Oral Take 2 mg by mouth as needed.    Marland Kitchen LOSARTAN POTASSIUM 25 MG PO TABS Oral Take 1 tablet (25 mg total) by mouth daily. 90 tablet 3  . MAGNESIUM HYDROXIDE 400 MG/5ML PO SUSP Oral Take 15 mLs by mouth daily as needed. Constipation    . POTASSIUM CHLORIDE CRYS ER 20 MEQ PO TBCR  TAKE 1 TABLET DAILY 90 tablet 3  . SERTRALINE HCL 50 MG PO TABS Oral Take 50 mg by mouth daily.    Marland Kitchen SIMVASTATIN 40 MG PO TABS Oral Take 1 tablet (40 mg total) by mouth at bedtime. 90 tablet 2  . CARVEDILOL 25 MG PO TABS Oral Take 1 tablet (25 mg total) by mouth 2 (two) times daily. 180 tablet 3    BP 172/79  Pulse 66  Temp 98.5 F (36.9 C)  Resp 22  SpO2 93%  Physical Exam  Nursing note and vitals reviewed. Constitutional: She is oriented to person, place, and time. She appears well-developed and well-nourished. No distress.  HENT:  Head: Normocephalic and atraumatic.  Mouth/Throat: Oropharynx is clear and moist.  Eyes: EOM are normal. Pupils are equal, round, and reactive to light.  Neck: Normal range of motion. Neck supple.       No posterior cervical midline TTP  Cardiovascular: Normal rate and regular rhythm.   Pulmonary/Chest: Effort normal and breath sounds normal. No respiratory distress. She has no wheezes. She has no rales.  Abdominal: Soft. Bowel sounds are normal. There is no tenderness. There is no rebound and no guarding.  Musculoskeletal: Normal range of motion. She exhibits tenderness. She exhibits no edema.       Left lower ext shortened and externally rotated. 2+ DP.   Neurological: She is alert and oriented to person, place, and time.       5/5 motor, sensation intact. FHL/EHL intact  Skin: Skin is warm and dry. No  rash noted. No erythema.  Psychiatric: She has a normal mood and affect. Her behavior is normal.    ED Course  Procedures (including critical care time)  Labs Reviewed  CBC - Abnormal; Notable for the following:    WBC 10.7 (*)     All other components within normal limits  DIFFERENTIAL - Abnormal; Notable for the following:    Neutro Abs 7.8 (*)     All other components within normal limits  COMPREHENSIVE METABOLIC PANEL - Abnormal; Notable for the following:    Glucose, Bld 129 (*)     Albumin 3.4 (*)     Total Bilirubin 0.2 (*)     GFR calc non Af Amer 63 (*)     GFR calc Af Amer 74 (*)     All other components within normal limits  URINALYSIS, ROUTINE W REFLEX MICROSCOPIC - Abnormal; Notable for the following:    APPearance CLOUDY (*)     All other components within normal limits  PROTIME-INR  APTT  POCT I-STAT TROPONIN I   Dg Hip Complete Left  05/04/2012  *RADIOLOGY REPORT*  Clinical Data: Status post fall; obvious left hip deformity, with left hip pain.  LEFT HIP - COMPLETE 2+ VIEW  Comparison: None.  Findings: There is a mildly displaced subcapital fracture involving the left femoral neck, with mild shortening at the fracture site. No additional fractures are seen.  The left femoral head remains seated at the acetabulum.  The patient's right hip hemiarthroplasty is grossly unremarkable in appearance.  The sacroiliac joints are within normal limits.  Mild degenerative change is noted at the lower lumbar spine.  No significant soft tissue abnormalities are characterized.  IMPRESSION: Mildly displaced subcapital fracture involving the left femoral neck, with mild shortening at the fracture site.  Original Report Authenticated By: Tonia Ghent, M.D.   Dg Chest Port 1 View  05/04/2012  *RADIOLOGY REPORT*  Clinical Data: Preoperative chest radiograph for left hip fracture. History of diabetes.  PORTABLE CHEST - 1 VIEW  Comparison: Chest radiograph performed 02/25/2012  Findings:  The lungs are well-aerated.  Mild vascular congestion is noted.  There is no evidence of focal opacification, pleural effusion or pneumothorax.  The cardiomediastinal silhouette is within normal limits.  No acute osseous abnormalities are seen.  IMPRESSION: Mild vascular congestion noted; lungs remain grossly clear.  No displaced rib fractures seen.  Original Report Authenticated By: Tonia Ghent, M.D.     1. Closed left hip fracture  Date: 05/04/2012  Rate: 77  Rhythm: normal sinus rhythm  QRS Axis: normal  Intervals: QRS prolonged  ST/T Wave abnormalities: nonspecific ST changes  Conduction Disutrbances:first-degree A-V block  and left bundle branch block  Narrative Interpretation:   Old EKG Reviewed: unchanged    MDM   Discussed with Dr Lestine Box. Asked to have medicine admit. Ortho will try to take to OR either tomorrow or on wednesday  Discussed with Dr Orvan Falconer. Will admit.   Loren Racer, MD 05/04/12 2325

## 2012-05-04 NOTE — ED Notes (Signed)
Brought in by EMS from assisted living apartments ([facility]) after her fall tonight. Per EMS, pt stumbled and fell on her left side, pt c/o severe left hip pain after her fall.  Presents to ED with shortening and external rotation of her left leg with pain to left hip area.

## 2012-05-05 ENCOUNTER — Inpatient Hospital Stay (HOSPITAL_COMMUNITY): Payer: Medicare PPO | Admitting: Anesthesiology

## 2012-05-05 ENCOUNTER — Encounter (HOSPITAL_COMMUNITY)
Admission: EM | Disposition: A | Discharge status: Skilled Nursing Facility | Payer: Self-pay | Source: Home / Self Care | Attending: Internal Medicine

## 2012-05-05 ENCOUNTER — Encounter (HOSPITAL_COMMUNITY): Payer: Self-pay | Admitting: Anesthesiology

## 2012-05-05 ENCOUNTER — Encounter (HOSPITAL_COMMUNITY): Payer: Self-pay | Admitting: Emergency Medicine

## 2012-05-05 ENCOUNTER — Inpatient Hospital Stay (HOSPITAL_COMMUNITY): Payer: Medicare PPO

## 2012-05-05 DIAGNOSIS — I10 Essential (primary) hypertension: Secondary | ICD-10-CM

## 2012-05-05 DIAGNOSIS — E119 Type 2 diabetes mellitus without complications: Secondary | ICD-10-CM

## 2012-05-05 DIAGNOSIS — S72009B Fracture of unspecified part of neck of unspecified femur, initial encounter for open fracture type I or II: Secondary | ICD-10-CM

## 2012-05-05 DIAGNOSIS — I5022 Chronic systolic (congestive) heart failure: Secondary | ICD-10-CM

## 2012-05-05 DIAGNOSIS — G629 Polyneuropathy, unspecified: Secondary | ICD-10-CM | POA: Diagnosis present

## 2012-05-05 HISTORY — PX: HIP ARTHROPLASTY: SHX981

## 2012-05-05 LAB — CBC
Hemoglobin: 13.5 g/dL (ref 12.0–15.0)
MCV: 94.6 fL (ref 78.0–100.0)
Platelets: 159 10*3/uL (ref 150–400)
RBC: 4.07 MIL/uL (ref 3.87–5.11)
WBC: 14.5 10*3/uL — ABNORMAL HIGH (ref 4.0–10.5)

## 2012-05-05 LAB — GLUCOSE, CAPILLARY
Glucose-Capillary: 105 mg/dL — ABNORMAL HIGH (ref 70–99)
Glucose-Capillary: 100 mg/dL — ABNORMAL HIGH (ref 70–99)

## 2012-05-05 LAB — CARDIAC PANEL(CRET KIN+CKTOT+MB+TROPI)
Relative Index: INVALID (ref 0.0–2.5)
Relative Index: 1.3 (ref 0.0–2.5)
Total CK: 80 U/L (ref 7–177)
Total CK: 131 U/L (ref 7–177)
Troponin I: <0.3 ng/mL (ref <0.30)
Troponin I: <0.3 ng/mL (ref <0.30)
Troponin I: <0.3 ng/mL (ref <0.30)

## 2012-05-05 LAB — BASIC METABOLIC PANEL
CO2: 26 mEq/L (ref 19–32)
Calcium: 9.3 mg/dL (ref 8.4–10.5)
Potassium: 3.6 mEq/L (ref 3.5–5.1)
Sodium: 139 mEq/L (ref 135–145)

## 2012-05-05 LAB — SURGICAL PCR SCREEN
MRSA, PCR: NEGATIVE
Staphylococcus aureus: NEGATIVE

## 2012-05-05 LAB — HEMOGLOBIN A1C: Mean Plasma Glucose: 123 mg/dL — ABNORMAL HIGH (ref <117)

## 2012-05-05 SURGERY — HEMIARTHROPLASTY, HIP, DIRECT ANTERIOR APPROACH, FOR FRACTURE
Anesthesia: Spinal | Site: Hip | Laterality: Left | Wound class: Clean

## 2012-05-05 MED ORDER — ASPIRIN 81 MG PO CHEW
81.0000 mg | CHEWABLE_TABLET | Freq: Every day | ORAL | Status: DC
  Administered 2012-05-06 – 2012-05-07 (×2): 81 mg via ORAL
  Filled 2012-05-05 (×2): qty 1

## 2012-05-05 MED ORDER — METOPROLOL TARTRATE 1 MG/ML IV SOLN
5.0000 mg | Freq: Four times a day (QID) | INTRAVENOUS | Status: DC | PRN
  Filled 2012-05-05: qty 5

## 2012-05-05 MED ORDER — 0.9 % SODIUM CHLORIDE (POUR BTL) OPTIME
TOPICAL | Status: DC | PRN
  Administered 2012-05-05: 1000 mL

## 2012-05-05 MED ORDER — LACTATED RINGERS IV SOLN: INTRAVENOUS | Status: DC

## 2012-05-05 MED ORDER — ACETAMINOPHEN 650 MG RE SUPP: 650.0000 mg | Freq: Four times a day (QID) | RECTAL | Status: DC | PRN

## 2012-05-05 MED ORDER — FUROSEMIDE 40 MG PO TABS
40.0000 mg | ORAL_TABLET | Freq: Every day | ORAL | Status: DC
  Administered 2012-05-06 – 2012-05-07 (×2): 40 mg via ORAL
  Filled 2012-05-05 (×2): qty 1

## 2012-05-05 MED ORDER — ONDANSETRON HCL 4 MG/2ML IJ SOLN
4.0000 mg | Freq: Four times a day (QID) | INTRAMUSCULAR | Status: DC | PRN
  Filled 2012-05-05: qty 2

## 2012-05-05 MED ORDER — VITAMINS A & D EX OINT
TOPICAL_OINTMENT | CUTANEOUS | Status: AC
  Administered 2012-05-05: 05:00:00
  Filled 2012-05-05: qty 5

## 2012-05-05 MED ORDER — HYDROMORPHONE HCL PF 1 MG/ML IJ SOLN: 0.2500 mg | INTRAMUSCULAR | Status: DC | PRN

## 2012-05-05 MED ORDER — ENOXAPARIN SODIUM 40 MG/0.4ML ~~LOC~~ SOLN
40.0000 mg | SUBCUTANEOUS | Status: DC
  Administered 2012-05-06: 40 mg via SUBCUTANEOUS
  Filled 2012-05-05 (×2): qty 0.4

## 2012-05-05 MED ORDER — ONDANSETRON HCL 4 MG/2ML IJ SOLN
4.0000 mg | INTRAMUSCULAR | Status: DC
  Administered 2012-05-05 – 2012-05-06 (×6): 4 mg via INTRAVENOUS
  Filled 2012-05-05 (×5): qty 2

## 2012-05-05 MED ORDER — INSULIN ASPART 100 UNIT/ML ~~LOC~~ SOLN
0.0000 [IU] | Freq: Three times a day (TID) | SUBCUTANEOUS | Status: DC
  Administered 2012-05-06 (×2): 2 [IU] via SUBCUTANEOUS

## 2012-05-05 MED ORDER — CHLORHEXIDINE GLUCONATE 4 % EX LIQD
60.0000 mL | Freq: Once | CUTANEOUS | Status: AC
  Administered 2012-05-05: 4 via TOPICAL
  Filled 2012-05-05: qty 60

## 2012-05-05 MED ORDER — PHENYLEPHRINE HCL 10 MG/ML IJ SOLN
INTRAMUSCULAR | Status: DC | PRN
  Administered 2012-05-05 (×2): 40 ug via INTRAVENOUS

## 2012-05-05 MED ORDER — BISACODYL 10 MG RE SUPP: 10.0000 mg | Freq: Every day | RECTAL | Status: DC | PRN

## 2012-05-05 MED ORDER — CARVEDILOL 25 MG PO TABS
25.0000 mg | ORAL_TABLET | Freq: Two times a day (BID) | ORAL | Status: DC
  Administered 2012-05-05 – 2012-05-07 (×4): 25 mg via ORAL
  Filled 2012-05-05 (×7): qty 1

## 2012-05-05 MED ORDER — DOCUSATE SODIUM 100 MG PO CAPS
100.0000 mg | ORAL_CAPSULE | Freq: Two times a day (BID) | ORAL | Status: DC
  Administered 2012-05-06 – 2012-05-07 (×4): 100 mg via ORAL

## 2012-05-05 MED ORDER — CLINDAMYCIN PHOSPHATE 600 MG/50ML IV SOLN
INTRAVENOUS | Status: DC | PRN
  Administered 2012-05-05: 600 mg via INTRAVENOUS

## 2012-05-05 MED ORDER — FLEET ENEMA 7-19 GM/118ML RE ENEM: 1.0000 | ENEMA | Freq: Once | RECTAL | Status: DC | PRN

## 2012-05-05 MED ORDER — MIDAZOLAM HCL 5 MG/5ML IJ SOLN
INTRAMUSCULAR | Status: DC | PRN
  Administered 2012-05-05: 1 mg via INTRAVENOUS

## 2012-05-05 MED ORDER — POLYETHYLENE GLYCOL 3350 17 G PO PACK
17.0000 g | PACK | Freq: Two times a day (BID) | ORAL | Status: DC
  Administered 2012-05-06 (×2): 17 g via ORAL

## 2012-05-05 MED ORDER — VANCOMYCIN HCL IN DEXTROSE 1-5 GM/200ML-% IV SOLN
1000.0000 mg | INTRAVENOUS | Status: DC
  Filled 2012-05-05: qty 200

## 2012-05-05 MED ORDER — FUROSEMIDE 10 MG/ML IJ SOLN
20.0000 mg | Freq: Every day | INTRAMUSCULAR | Status: DC | PRN
  Filled 2012-05-05: qty 2

## 2012-05-05 MED ORDER — METHOCARBAMOL 100 MG/ML IJ SOLN
500.0000 mg | Freq: Four times a day (QID) | INTRAVENOUS | Status: DC | PRN
  Administered 2012-05-06: 500 mg via INTRAVENOUS
  Filled 2012-05-05 (×2): qty 5

## 2012-05-05 MED ORDER — METHOCARBAMOL 500 MG PO TABS
500.0000 mg | ORAL_TABLET | Freq: Four times a day (QID) | ORAL | Status: DC | PRN
  Administered 2012-05-06 – 2012-05-07 (×2): 500 mg via ORAL
  Filled 2012-05-05 (×2): qty 1

## 2012-05-05 MED ORDER — ALUM & MAG HYDROXIDE-SIMETH 200-200-20 MG/5ML PO SUSP: 30.0000 mL | ORAL | Status: DC | PRN

## 2012-05-05 MED ORDER — INSULIN ASPART 100 UNIT/ML ~~LOC~~ SOLN
0.0000 [IU] | SUBCUTANEOUS | Status: DC
  Administered 2012-05-05: 1 [IU] via SUBCUTANEOUS

## 2012-05-05 MED ORDER — MENTHOL 3 MG MT LOZG
1.0000 | LOZENGE | OROMUCOSAL | Status: DC | PRN
  Filled 2012-05-05: qty 9

## 2012-05-05 MED ORDER — ACETAMINOPHEN 325 MG PO TABS: 650.0000 mg | ORAL_TABLET | Freq: Four times a day (QID) | ORAL | Status: DC | PRN

## 2012-05-05 MED ORDER — ONDANSETRON HCL 4 MG/2ML IJ SOLN
INTRAMUSCULAR | Status: DC | PRN
  Administered 2012-05-05 (×2): 2 mg via INTRAVENOUS

## 2012-05-05 MED ORDER — SODIUM CHLORIDE 0.9 % IV SOLN
INTRAVENOUS | Status: DC
  Administered 2012-05-06: 01:00:00 via INTRAVENOUS
  Filled 2012-05-05 (×2): qty 1000

## 2012-05-05 MED ORDER — FENTANYL CITRATE 0.05 MG/ML IJ SOLN
INTRAMUSCULAR | Status: DC | PRN
  Administered 2012-05-05 (×2): 50 ug via INTRAVENOUS

## 2012-05-05 MED ORDER — HYDROCODONE-ACETAMINOPHEN 5-325 MG PO TABS
1.0000 | ORAL_TABLET | ORAL | Status: DC | PRN
  Administered 2012-05-06: 1 via ORAL
  Filled 2012-05-05: qty 2
  Filled 2012-05-05: qty 1

## 2012-05-05 MED ORDER — EPHEDRINE SULFATE 50 MG/ML IJ SOLN
INTRAMUSCULAR | Status: DC | PRN
  Administered 2012-05-05 (×2): 5 mg via INTRAVENOUS

## 2012-05-05 MED ORDER — ACETAMINOPHEN 650 MG RE SUPP: 650.0000 mg | RECTAL | Status: DC | PRN

## 2012-05-05 MED ORDER — CLINDAMYCIN PHOSPHATE 600 MG/50ML IV SOLN
600.0000 mg | Freq: Four times a day (QID) | INTRAVENOUS | Status: AC
  Administered 2012-05-06 (×2): 600 mg via INTRAVENOUS
  Filled 2012-05-05 (×2): qty 50

## 2012-05-05 MED ORDER — FUROSEMIDE 40 MG PO TABS
40.0000 mg | ORAL_TABLET | Freq: Every day | ORAL | Status: DC
  Filled 2012-05-05 (×2): qty 1

## 2012-05-05 MED ORDER — FLEET ENEMA 7-19 GM/118ML RE ENEM: 1.0000 | ENEMA | Freq: Once | RECTAL | Status: AC | PRN

## 2012-05-05 MED ORDER — ACETAMINOPHEN 10 MG/ML IV SOLN
INTRAVENOUS | Status: DC | PRN
  Administered 2012-05-05: 1000 mg via INTRAVENOUS

## 2012-05-05 MED ORDER — PROMETHAZINE HCL 25 MG/ML IJ SOLN: 6.2500 mg | INTRAMUSCULAR | Status: DC | PRN

## 2012-05-05 MED ORDER — MEPERIDINE HCL 50 MG/ML IJ SOLN: 6.2500 mg | INTRAMUSCULAR | Status: DC | PRN

## 2012-05-05 MED ORDER — METOCLOPRAMIDE HCL 5 MG/ML IJ SOLN: 5.0000 mg | Freq: Three times a day (TID) | INTRAMUSCULAR | Status: DC | PRN

## 2012-05-05 MED ORDER — LACTATED RINGERS IV SOLN
INTRAVENOUS | Status: DC | PRN
  Administered 2012-05-05 (×2): via INTRAVENOUS

## 2012-05-05 MED ORDER — MORPHINE SULFATE 2 MG/ML IJ SOLN
2.0000 mg | Freq: Once | INTRAMUSCULAR | Status: AC
  Administered 2012-05-05: 2 mg via INTRAVENOUS
  Filled 2012-05-05: qty 1

## 2012-05-05 MED ORDER — ONDANSETRON HCL 4 MG PO TABS: 4.0000 mg | ORAL_TABLET | Freq: Four times a day (QID) | ORAL | Status: DC | PRN

## 2012-05-05 MED ORDER — PHENOL 1.4 % MT LIQD
1.0000 | OROMUCOSAL | Status: DC | PRN
  Filled 2012-05-05: qty 177

## 2012-05-05 MED ORDER — POTASSIUM CHLORIDE IN NACL 20-0.9 MEQ/L-% IV SOLN
INTRAVENOUS | Status: DC
  Administered 2012-05-05 (×2): via INTRAVENOUS
  Filled 2012-05-05 (×4): qty 1000

## 2012-05-05 MED ORDER — PROPOFOL 10 MG/ML IV EMUL
INTRAVENOUS | Status: DC | PRN
  Administered 2012-05-05: 75 ug/kg/min via INTRAVENOUS

## 2012-05-05 MED ORDER — METOCLOPRAMIDE HCL 10 MG PO TABS: 5.0000 mg | ORAL_TABLET | Freq: Three times a day (TID) | ORAL | Status: DC | PRN

## 2012-05-05 SURGICAL SUPPLY — 52 items
ADH SKN CLS APL DERMABOND .7 (GAUZE/BANDAGES/DRESSINGS) ×2
BAG SPEC THK2 15X12 ZIP CLS (MISCELLANEOUS)
BAG ZIPLOCK 12X15 (MISCELLANEOUS) ×1 IMPLANT
BLADE SAW SGTL 18X1.27X75 (BLADE) ×2 IMPLANT
CLOTH BEACON ORANGE TIMEOUT ST (SAFETY) ×2 IMPLANT
DERMABOND ADVANCED (GAUZE/BANDAGES/DRESSINGS) ×2
DERMABOND ADVANCED .7 DNX12 (GAUZE/BANDAGES/DRESSINGS) IMPLANT
DRAPE INCISE IOBAN 85X60 (DRAPES) ×2 IMPLANT
DRAPE ORTHO SPLIT 77X108 STRL (DRAPES) ×4
DRAPE POUCH INSTRU U-SHP 10X18 (DRAPES) ×2 IMPLANT
DRAPE SURG 17X11 SM STRL (DRAPES) ×2 IMPLANT
DRAPE SURG ORHT 6 SPLT 77X108 (DRAPES) ×2 IMPLANT
DRAPE U-SHAPE 47X51 STRL (DRAPES) ×2 IMPLANT
DRSG AQUACEL AG ADV 3.5X10 (GAUZE/BANDAGES/DRESSINGS) ×2 IMPLANT
DRSG MEPILEX BORDER 4X4 (GAUZE/BANDAGES/DRESSINGS) ×2 IMPLANT
DRSG MEPILEX BORDER 4X8 (GAUZE/BANDAGES/DRESSINGS) ×1 IMPLANT
DURAPREP 26ML APPLICATOR (WOUND CARE) ×2 IMPLANT
ELECT BLADE TIP CTD 4 INCH (ELECTRODE) ×2 IMPLANT
ELECT REM PT RETURN 9FT ADLT (ELECTROSURGICAL) ×2
ELECTRODE REM PT RTRN 9FT ADLT (ELECTROSURGICAL) ×1 IMPLANT
EVACUATOR 1/8 PVC DRAIN (DRAIN) ×1 IMPLANT
FACESHIELD LNG OPTICON STERILE (SAFETY) ×7 IMPLANT
GLOVE BIOGEL PI IND STRL 7.5 (GLOVE) ×1 IMPLANT
GLOVE BIOGEL PI IND STRL 8 (GLOVE) ×1 IMPLANT
GLOVE BIOGEL PI INDICATOR 7.5 (GLOVE) ×1
GLOVE BIOGEL PI INDICATOR 8 (GLOVE) ×1
GLOVE ECLIPSE 8.0 STRL XLNG CF (GLOVE) ×1 IMPLANT
GLOVE ORTHO TXT STRL SZ7.5 (GLOVE) ×4 IMPLANT
GLOVE SURG ORTHO 8.0 STRL STRW (GLOVE) ×1 IMPLANT
GLOVE SURG SS PI 8.5 STRL IVOR (GLOVE) ×2
GLOVE SURG SS PI 8.5 STRL STRW (GLOVE) IMPLANT
GOWN BRE IMP PREV XXLGXLNG (GOWN DISPOSABLE) ×4 IMPLANT
GOWN STRL NON-REIN LRG LVL3 (GOWN DISPOSABLE) ×2 IMPLANT
GOWN STRL REIN 2XL LVL4 (GOWN DISPOSABLE) ×1 IMPLANT
HANDPIECE INTERPULSE COAX TIP (DISPOSABLE)
IMMOBILIZER KNEE 20 (SOFTGOODS) ×2
IMMOBILIZER KNEE 20 THIGH 36 (SOFTGOODS) IMPLANT
KIT BASIN OR (CUSTOM PROCEDURE TRAY) ×2 IMPLANT
MANIFOLD NEPTUNE II (INSTRUMENTS) ×2 IMPLANT
PACK TOTAL JOINT (CUSTOM PROCEDURE TRAY) ×2 IMPLANT
POSITIONER SURGICAL ARM (MISCELLANEOUS) ×2 IMPLANT
SET HNDPC FAN SPRY TIP SCT (DISPOSABLE) IMPLANT
STRIP CLOSURE SKIN 1/2X4 (GAUZE/BANDAGES/DRESSINGS) ×2 IMPLANT
SUT ETHIBOND NAB CT1 #1 30IN (SUTURE) ×3 IMPLANT
SUT MNCRL AB 4-0 PS2 18 (SUTURE) ×2 IMPLANT
SUT VIC AB 1 CT1 36 (SUTURE) ×4 IMPLANT
SUT VIC AB 2-0 CT1 27 (SUTURE) ×4
SUT VIC AB 2-0 CT1 TAPERPNT 27 (SUTURE) ×2 IMPLANT
SUT VLOC 180 0 24IN GS25 (SUTURE) ×1 IMPLANT
TOWEL OR 17X26 10 PK STRL BLUE (TOWEL DISPOSABLE) ×4 IMPLANT
TRAY FOLEY CATH 14FRSI W/METER (CATHETERS) ×2 IMPLANT
WATER STERILE IRR 1500ML POUR (IV SOLUTION) ×1 IMPLANT

## 2012-05-05 NOTE — Discharge Instructions (Addendum)
WBAT Left lower extremity  DVT prophylaxis - lovenox 40mg  SQ for 2 weeks total  RTC [name], [facility] [phone], in 2-3 weeks for wound check  Maintain post operative dressing until June 30, then cover daily as needed with gauze  Call [phone] with any questions or concerns   Follow with Primary MD [name], MD in 7 days   Get CBC, CMP, checked 3 days by the physician at nursing facility.  Get Medicines reviewed and adjusted.  Please request your Prim.MD to go over all Hospital Tests and Procedure/Radiological results at the follow up, please get all Hospital records sent to your Prim MD by signing hospital release before you go home.  Activity: As tolerated with Full fall precautions use walker/cane & assistance as needed  Diet: Heart healthy - Low Carb , Aspiration precautions.  For Heart failure patients - Check your Weight same time everyday, if you gain over 2 pounds, or you develop in leg swelling, experience more shortness of breath or chest pain, call your Primary MD immediately. Follow Cardiac Low Salt Diet and 1.8 lit/day fluid restriction.  Disposition SNF  If you experience worsening of your admission symptoms, develop shortness of breath, life threatening emergency, suicidal or homicidal thoughts you must seek medical attention immediately by calling 911 or calling your MD immediately  if symptoms less severe.  You Must read complete instructions/literature along with all the possible adverse reactions/side effects for all the Medicines you take and that have been prescribed to you. Take any new Medicines after you have completely understood and accpet all the possible adverse reactions/side effects.   Do not drive if your were admitted for syncope or siezures until you have seen by Primary MD or a Neurologist and advised to drive.  Do not drive when taking Pain medications.    Do not take more than prescribed Pain, Sleep and Anxiety  Medications  Special Instructions: If you have smoked or chewed Tobacco  in the last 2 yrs please stop smoking, stop any regular Alcohol  and or any Recreational drug use.  Wear Seat belts while driving.

## 2012-05-05 NOTE — H&P (Signed)
PCP:   [name], MD   Chief Complaint:  Fall this afternoon   HPI: [name] is an 78 y.o. female.   Multiple medical problems, sustained a mechanical fall while folding clothes at home. Patient fell and injured her left hip, did not hit her head, did not lose consciousness,  was brought to the emergency room by ambulance where x-rays revealed subcapital fracture of the left femoral neck. Dr. Lestine Box orthopedic surgeon was consulted and he requested a hospitalist admission.   Patient reports that all her chronic medical conditions are stable; diabetes controlled on diet; her age her fibrillation is controlled without anti-coagulation; she is compliant with all her medications. She has a past history of right hip fracture repaired by Dr. Jene Every, complicated only by postoperative confusion felt to be related to pain medication.  At baseline she does not suffer with chest pains or shortness of  Breath; At one time she had an ejection fraction described as 20% ; 2-D echo done in September 2011 describes ejection fraction is mild to moderately depressed diffuse hypokinesis and grade 1 diastolic dysfunction. She has chronic left bundle branch block.  Reports chronic numbness of both feet due to neuropathy  Rewiew of Systems:  The patient denies anorexia, fever, weight loss,, vision loss, decreased hearing, hoarseness, chest pain, syncope, dyspnea on exertion, peripheral edema, balance deficits, hemoptysis, abdominal pain, melena, hematochezia, severe indigestion/heartburn, hematuria, incontinence, genital sores, muscle weakness, suspicious skin lesions, transient blindness, difficulty walking, depression, unusual weight change, abnormal bleeding, enlarged lymph nodes, angioedema, and breast masses.   Past Medical History  Diagnosis Date  . Cholelithiasis   . Other postprocedural status   . Atrial fibrillation   . Hepatic cyst   . GERD (gastroesophageal reflux disease)     . COPD (chronic obstructive pulmonary disease)   . LBBB (left bundle branch block)   . Diabetes mellitus   . Hyperlipidemia   . Hypertension   . Ischemic cardiomyopathy   . Dizziness     with some gait disability  . Peripheral edema     Past Surgical History  Procedure Date  . Wrist surgery   . Dilation and curettage     Medications:  HOME MEDS: Prior to Admission medications   Medication Sig Start Date End Date Taking? Authorizing Provider  aspirin 81 MG tablet Take 81 mg by mouth daily.     Yes Historical Provider, MD  benazepril (LOTENSIN) 20 MG tablet Take 20 mg by mouth daily.   Yes Historical Provider, MD  carvedilol (COREG) 25 MG tablet Take 25 mg by mouth 2 (two) times daily with a meal.   Yes Historical Provider, MD  furosemide (LASIX) 40 MG tablet Take 40 mg by mouth daily.     Yes Historical Provider, MD  loperamide (IMODIUM A-D) 2 MG tablet Take 2 mg by mouth as needed.   Yes Historical Provider, MD  losartan (COZAAR) 25 MG tablet Take 1 tablet (25 mg total) by mouth daily. 01/31/12 01/30/13 Yes Wendall Stade, MD  magnesium hydroxide (MILK OF MAGNESIA) 400 MG/5ML suspension Take 15 mLs by mouth daily as needed. Constipation   Yes Historical Provider, MD  potassium chloride SA (K-DUR,KLOR-CON) 20 MEQ tablet TAKE 1 TABLET DAILY 06/05/11  Yes Wendall Stade, MD  sertraline (ZOLOFT) 50 MG tablet Take 50 mg by mouth daily.   Yes Historical Provider, MD  simvastatin (ZOCOR) 40 MG tablet Take 1 tablet (40 mg total) by mouth at bedtime. 12/10/11  Yes Theron Arista  Phillips Hay, MD  carvedilol (COREG) 25 MG tablet Take 1 tablet (25 mg total) by mouth 2 (two) times daily. 04/25/11 04/24/12  Wendall Stade, MD     Allergies:  Allergies  Allergen Reactions  . Penicillins Swelling  . Sulfonamide Derivatives Swelling  . Tetanus Toxoid Swelling    Social History:   reports that she has never smoked. She does not have any smokeless tobacco history on file. She reports that she does not drink  alcohol. Her drug history not on file.  Family History: Family History  Problem Relation Age of Onset  . Heart failure Brother     died age 21     Physical Exam: Filed Vitals:   05/04/12 2010  BP: 172/79  Pulse: 66  Temp: 98.5 F (36.9 C)  Resp: 22  SpO2: 93%   Blood pressure 172/79, pulse 66, temperature 98.5 F (36.9 C), resp. rate 22, SpO2 93.00%.  GEN:   very pleasant elderly Caucasian lady lying in the stretcher in no acute distress; cooperative with exam PSYCH:  alert and oriented x4; does not appear anxious or depressed; affect is appropriate. HEENT: Mucous membranes pink and anicteric; PERRLA; EOM intact; no cervical lymphadenopathy nor thyromegaly or carotid bruit; Breasts:: Not examined CHEST WALL: No tenderness CHEST: Normal respiration, clear to auscultation bilaterally HEART: Regular rate and rhythm; no murmurs rubs or gallops BACK:  no CVA tenderness ABDOMEN: Obese, soft non-tender; no masses, no organomegaly, normal abdominal bowel sounds; ; no intertriginous candida. Rectal Exam: Not done EXTREMITIES:  age-appropriate arthropathy of the hands and knees; no edema; no ulcerations. Left lower extremity shortened and externally rotated Genitalia: not examined PULSES: 2+ and symmetric SKIN: Normal hydration no rash or ulceration CNS: Cranial nerves 2-12 grossly intact no focal lateralizing neurologic deficit   Labs & Imaging Results for orders placed during the hospital encounter of 05/04/12 (from the past 48 hour(s))  CBC     Status: Abnormal   Collection Time   05/04/12  9:39 PM      Component Value Range Comment   WBC 10.7 (*) 4.0 - 10.5 K/uL    RBC 4.09  3.87 - 5.11 MIL/uL    Hemoglobin 13.4  12.0 - 15.0 g/dL    HCT 96.2  95.2 - 84.1 %    MCV 95.1  78.0 - 100.0 fL    MCH 32.8  26.0 - 34.0 pg    MCHC 34.4  30.0 - 36.0 g/dL    RDW 32.4  40.1 - 02.7 %    Platelets 169  150 - 400 K/uL   DIFFERENTIAL     Status: Abnormal   Collection Time   05/04/12   9:39 PM      Component Value Range Comment   Neutrophils Relative 73  43 - 77 %    Neutro Abs 7.8 (*) 1.7 - 7.7 K/uL    Lymphocytes Relative 14  12 - 46 %    Lymphs Abs 1.5  0.7 - 4.0 K/uL    Monocytes Relative 8  3 - 12 %    Monocytes Absolute 0.9  0.1 - 1.0 K/uL    Eosinophils Relative 5  0 - 5 %    Eosinophils Absolute 0.5  0.0 - 0.7 K/uL    Basophils Relative 0  0 - 1 %    Basophils Absolute 0.0  0.0 - 0.1 K/uL   COMPREHENSIVE METABOLIC PANEL     Status: Abnormal   Collection Time   05/04/12  9:39  PM      Component Value Range Comment   Sodium 140  135 - 145 mEq/L    Potassium 3.6  3.5 - 5.1 mEq/L    Chloride 104  96 - 112 mEq/L    CO2 26  19 - 32 mEq/L    Glucose, Bld 129 (*) 70 - 99 mg/dL    BUN 15  6 - 23 mg/dL    Creatinine, Ser 1.61  0.50 - 1.10 mg/dL    Calcium 9.2  8.4 - 09.6 mg/dL    Total Protein 6.8  6.0 - 8.3 g/dL    Albumin 3.4 (*) 3.5 - 5.2 g/dL    AST 19  0 - 37 U/L    ALT 18  0 - 35 U/L    Alkaline Phosphatase 49  39 - 117 U/L    Total Bilirubin 0.2 (*) 0.3 - 1.2 mg/dL    GFR calc non Af Amer 63 (*) >90 mL/min    GFR calc Af Amer 74 (*) >90 mL/min   PROTIME-INR     Status: Normal   Collection Time   05/04/12  9:39 PM      Component Value Range Comment   Prothrombin Time 13.4  11.6 - 15.2 seconds    INR 1.00  0.00 - 1.49   APTT     Status: Normal   Collection Time   05/04/12  9:39 PM      Component Value Range Comment   aPTT 37  24 - 37 seconds   POCT I-STAT TROPONIN I     Status: Normal   Collection Time   05/04/12  9:43 PM      Component Value Range Comment   Troponin i, poc 0.00  0.00 - 0.08 ng/mL    Comment 3            URINALYSIS, ROUTINE W REFLEX MICROSCOPIC     Status: Abnormal   Collection Time   05/04/12 10:44 PM      Component Value Range Comment   Color, Urine YELLOW  YELLOW    APPearance CLOUDY (*) CLEAR    Specific Gravity, Urine 1.016  1.005 - 1.030    pH 7.5  5.0 - 8.0    Glucose, UA NEGATIVE  NEGATIVE mg/dL    Hgb urine dipstick  NEGATIVE  NEGATIVE    Bilirubin Urine NEGATIVE  NEGATIVE    Ketones, ur NEGATIVE  NEGATIVE mg/dL    Protein, ur NEGATIVE  NEGATIVE mg/dL    Urobilinogen, UA 0.2  0.0 - 1.0 mg/dL    Nitrite NEGATIVE  NEGATIVE    Leukocytes, UA NEGATIVE  NEGATIVE MICROSCOPIC NOT DONE ON URINES WITH NEGATIVE PROTEIN, BLOOD, LEUKOCYTES, NITRITE, OR GLUCOSE <1000 mg/dL.   Dg Hip Complete Left  05/04/2012  *RADIOLOGY REPORT*  Clinical Data: Status post fall; obvious left hip deformity, with left hip pain.  LEFT HIP - COMPLETE 2+ VIEW  Comparison: None.  Findings: There is a mildly displaced subcapital fracture involving the left femoral neck, with mild shortening at the fracture site. No additional fractures are seen.  The left femoral head remains seated at the acetabulum.  The patient's right hip hemiarthroplasty is grossly unremarkable in appearance.  The sacroiliac joints are within normal limits.  Mild degenerative change is noted at the lower lumbar spine.  No significant soft tissue abnormalities are characterized.  IMPRESSION: Mildly displaced subcapital fracture involving the left femoral neck, with mild shortening at the fracture site.  Original Report Authenticated By:  Tonia Ghent, M.D.   Dg Chest Port 1 View  05/04/2012  *RADIOLOGY REPORT*  Clinical Data: Preoperative chest radiograph for left hip fracture. History of diabetes.  PORTABLE CHEST - 1 VIEW  Comparison: Chest radiograph performed 02/25/2012  Findings: The lungs are well-aerated.  Mild vascular congestion is noted.  There is no evidence of focal opacification, pleural effusion or pneumothorax.  The cardiomediastinal silhouette is within normal limits.  No acute osseous abnormalities are seen.  IMPRESSION: Mild vascular congestion noted; lungs remain grossly clear.  No displaced rib fractures seen.  Original Report Authenticated By: Tonia Ghent, M.D.      Assessment Present on Admission:  .Closed left hip fracture .DIABETES MELLITUS, diet  controlled  .Chronic systolic HF (heart failure) .HYPERLIPIDEMIA-MIXED .DEPRESSIVE DISORDER NOT ELSEWHERE CLASSIFIED .HYPERTENSION, BENIGN .Atrial fibrillation, paroxysmal now in sinus rhythm  .GERD .LEFT BUNDLE BRANCH BLOCK   PLAN:  We'll admit this lady for hip fracture repair. She has no acute cardiac symptoms; is not described symptoms of angina; her heart failure is well compensated; she has no acute EKG changes.  Surgical risk is moderate and but outweighs the benefits of nonoperative treatment so she can be cleared for hip repair as soon as possible. She should probably have cardiology follow up in the perioperative period. And a cardiology consult can be sought in the morning.  Will keep her n.p.o. after midnight; continue her rate controlling drugs and give when necessary Lopressor for additional coverage.  Sliding scale insulin while n.p.o.  Weight IV fluids at 75 cc per hour, but keep an order for when necessary Lasix and she reports fluid overload due to excessive hydration on her previous admission.    Other plans as per orders.   Dagen Beevers 05/05/2012, 12:11 AM

## 2012-05-05 NOTE — Progress Notes (Signed)
Clinical Social Work Department BRIEF PSYCHOSOCIAL ASSESSMENT 05/05/2012  Patient:  [name], [name]     Account Number:  [id number]     Admit date:  05/04/2012  Clinical Social Worker:  Candie Chroman  Date/Time:  05/05/2012 01:43 PM  Referred by:    Date Referred:  05/05/2012 Referred for  SNF Placement   Other Referral:   Interview type:  Patient Other interview type:    PSYCHOSOCIAL DATA Living Status:  ALONE Admitted from facility:   Level of care:   Primary support name:  Nils Pyle Primary support relationship to patient:   Degree of support available:   supportive    CURRENT CONCERNS Current Concerns  Post-Acute Placement   Other Concerns:    SOCIAL WORK ASSESSMENT / PLAN Pt is an 78 yr old female living at home, alone, prior to hospitalization. Met with pt/niece to assist with d/c planning. Pt is planning to have surgegry today. ST SNF placement will be needed upon hospital d/c. CSW will initiate SNF search following surgery and provide bed offers as received. Pt has [facility] which requires prior approval for SNF placement. CSW will assist with this process.   Assessment/plan status:  Psychosocial Support/Ongoing Assessment of Needs Other assessment/ plan:   Information/referral to community resources:   SNF list provided. Insurance requirements reviewed.    PATIENT'S/FAMILY'S RESPONSE TO PLAN OF CARE: Pt is interested in ST rehab upon d/c.     Cori Razor LCSW [phone]

## 2012-05-05 NOTE — Preoperative (Addendum)
Beta Blockers   Reason not to administer Beta Blockers:Not Applicable  pt took beta blocker within 24 hours of surgery

## 2012-05-05 NOTE — Transfer of Care (Signed)
Immediate Anesthesia Transfer of Care Note  Patient: [name]  Procedure(s) Performed: Procedure(s) (LRB): ARTHROPLASTY BIPOLAR HIP (Left)  Patient Location: PACU  Anesthesia Type: Spinal  Level of Consciousness: awake, oriented and patient cooperative  Airway & Oxygen Therapy: Patient Spontanous Breathing and Patient connected to face mask oxygen  Post-op Assessment: Report given to PACU RN and Post -op Vital signs reviewed and stable  Post vital signs: Reviewed and stable  Complications: No apparent anesthesia complications

## 2012-05-05 NOTE — Anesthesia Preprocedure Evaluation (Addendum)
Anesthesia Evaluation  Patient identified by MRN, date of birth, ID band Patient awake    Reviewed: Allergy & Precautions, H&P , NPO status , Patient's Chart, lab work & pertinent test results  Airway Mallampati: II TM Distance: >3 FB Neck ROM: Full    Dental No notable dental hx.    Pulmonary neg pulmonary ROS, COPD breath sounds clear to auscultation  Pulmonary exam normal       Cardiovascular hypertension, Pt. on medications +CHF negative cardio ROS  + dysrhythmias Atrial Fibrillation Rhythm:Regular Rate:Normal  LBBB   Neuro/Psych negative neurological ROS  negative psych ROS   GI/Hepatic negative GI ROS, Neg liver ROS, GERD-  Medicated and Controlled,  Endo/Other  negative endocrine ROSDiabetes mellitus-  Renal/GU negative Renal ROS  negative genitourinary   Musculoskeletal negative musculoskeletal ROS (+)   Abdominal   Peds negative pediatric ROS (+)  Hematology negative hematology ROS (+)   Anesthesia Other Findings   Reproductive/Obstetrics negative OB ROS                          Anesthesia Physical Anesthesia Plan  ASA: III  Anesthesia Plan: Spinal   Post-op Pain Management:    Induction: Intravenous  Airway Management Planned: Simple Face Mask  Additional Equipment:   Intra-op Plan:   Post-operative Plan:   Informed Consent: I have reviewed the patients History and Physical, chart, labs and discussed the procedure including the risks, benefits and alternatives for the proposed anesthesia with the patient or authorized representative who has indicated his/her understanding and acceptance.   Dental advisory given  Plan Discussed with: CRNA  Anesthesia Plan Comments:         Anesthesia Quick Evaluation

## 2012-05-05 NOTE — Op Note (Signed)
NAME:  [name], [name] NO.:  [id number]   MEDICAL RECORD NO.: [id number]   LOCATION:  1435                         FACILITY:  [facility]   DATE OF BIRTH:  [birth year]  PHYSICIAN:  Madlyn Frankel. [name], M.D.     DATE OF PROCEDURE:  05/05/2012                               OPERATIVE REPORT     PREOPERATIVE DIAGNOSIS:  Left displaced femoral neck fracture.   POSTOPERATIVE DIAGNOSIS:  Left displaced femoral neck fracture.   PROCEDURE:  Left hip hemiarthroplasty utilizing DePuy component, size 5 standard Tri-Lock stem with a 54 unipolar ball with a +5 adapter.   SURGEON:  Madlyn Frankel. [name], MD   ASSISTANT:  Lanney Gins, PA-C.   ANESTHESIA:  General.   SPECIMENS:  None.   DRAINS:  One medium Hemovac.   BLOOD LOSS:  About 200 cc.   COMPLICATIONS:  None.   INDICATION OF PROCEDURE:  [name] is a 78 year old female who lives independently.  She unfortunately had a fall at her house.  She was admitted to the hospital after radiographs revealed a femoral neck fracture.  She was seen and evaluated and was scheduled for surgery for fixation.  The necessity of surgical repair was discussed with she and her family.  Consent was obtained after reviewing risks of infection, DVT, component failure, and need for revision surgery.   PROCEDURE IN DETAIL:  The patient was brought to the operative theater. Once adequate anesthesia, preoperative antibiotics, 600mg  of Clindamycin administered, the patient was positioned into the right lateral decubitus position with the left side up.  The left lower extremity was then prepped and draped in sterile fashion.  A time-out was performed identifying the patient, planned procedure, and extremity.   A lateral incision was made off the proximal trochanter. Sharp dissection was carried down to the iliotibial band and gluteal fascia. The gluteal fascia was then incised for posterior approach.  The short external rotators were  taken down separate from the posterior capsule. An L capsulotomy was made preserving the posterior leaflet for later anatomic repair. Fracture site was identified and after removing comminuted segments of the posterior femoral neck, the femoral head was removed without difficulty and measured on the back table  using the sizing rings and determined to be 54 mm in diameter.   The proximal femur was then exposed.  Retractors placed.  I then drilled, opened the proximal femur.  Then I hand reamed once and  Irrigated the canal to try to prevent fat emboli.  I began broaching the femur with a 0 broach up to a size 5 broach with good medial and lateral metaphyseal fit without evidence of any torsion or movement.  A trial reduction was carried out with a standard neck and a +0 adapter with a 54 ball.  The hip reduced nicely.  The leg lengths appeared to be equal compared to the down leg.   The hip went through a range of motion without evidence of any subluxation or impingement.   Given these findings, the trial components removed.  The final 5 standard  Tri-Lock stem was opened.  After irrigating the canal, the final stem was impacted and  sat at the level where the broach was. Based on this and the trial reduction, a +5 adapter was opened and impacted in the 54 unipolar ball onto a clean and dry trunnion.  The hip had been irrigated throughout the case and again at this point.  I re- Approximated the posterior capsule to the superior leaflet using a  #1 Vicryl,  and placed a medium Hemovac drain deep.  The remainder of the wound was closed with #1 Vicryl in the iliotibial band and gluteal fascia, a  2-0 Vicryl in the sub-Q tissue and a running 4-0 Monocryl in the skin.  The hip was cleaned, dried, and dressed sterilely using Dermabond and Aquacel dressing.  Drain site was dressed separately.  She was then brought to recovery room, extubated in stable condition, tolerating the procedure  well.  Lanney Gins, PA-C was present and utilized as Geophysicist/field seismologist for the entire case from  Preoperative positioning to management of the contralateral extremity and retractors to  General facilitation of the procedure.  He was also involved with primary wound closure.         Madlyn Frankel [name], M.D.

## 2012-05-05 NOTE — Consult Note (Addendum)
Reason for Consult:left hip fracture Referring Physician: Loren Racer, MD   [name] is an 78 y.o. female.  HPI: Reports left foot just " stuck to the floor" causing her fall. Only injury left hip. Denies LOC,dizziness, CP or any other injury.  Reports balance issues and uses cane to ambulate.Patient lives alone.   Past Medical History  Diagnosis Date  . Cholelithiasis   . Other postprocedural status   . Atrial fibrillation   . Hepatic cyst   . GERD (gastroesophageal reflux disease)   . COPD (chronic obstructive pulmonary disease)   . LBBB (left bundle branch block)   . Diabetes mellitus   . Hyperlipidemia   . Hypertension   . Ischemic cardiomyopathy   . Dizziness     with some gait disability  . Peripheral edema     Past Surgical History  Procedure Date  . Wrist surgery   . Dilation and curettage     Family History  Problem Relation Age of Onset  . Heart failure Brother     died age 62    Social History:  reports that she has never smoked. She does not have any smokeless tobacco history on file. She reports that she does not drink alcohol. Her drug history not on file.  Allergies:  Allergies  Allergen Reactions  . Penicillins Swelling  . Sulfonamide Derivatives Swelling  . Tetanus Toxoid Swelling    Medications: I have reviewed the patient's current medications.  Results for orders placed during the hospital encounter of 05/04/12 (from the past 48 hour(s))  CBC     Status: Abnormal   Collection Time   05/04/12  9:39 PM      Component Value Range Comment   WBC 10.7 (*) 4.0 - 10.5 K/uL    RBC 4.09  3.87 - 5.11 MIL/uL    Hemoglobin 13.4  12.0 - 15.0 g/dL    HCT 41.3  24.4 - 01.0 %    MCV 95.1  78.0 - 100.0 fL    MCH 32.8  26.0 - 34.0 pg    MCHC 34.4  30.0 - 36.0 g/dL    RDW 27.2  53.6 - 64.4 %    Platelets 169  150 - 400 K/uL   DIFFERENTIAL     Status: Abnormal   Collection Time   05/04/12  9:39 PM      Component Value Range Comment   Neutrophils Relative 73  43 - 77 %    Neutro Abs 7.8 (*) 1.7 - 7.7 K/uL    Lymphocytes Relative 14  12 - 46 %    Lymphs Abs 1.5  0.7 - 4.0 K/uL    Monocytes Relative 8  3 - 12 %    Monocytes Absolute 0.9  0.1 - 1.0 K/uL    Eosinophils Relative 5  0 - 5 %    Eosinophils Absolute 0.5  0.0 - 0.7 K/uL    Basophils Relative 0  0 - 1 %    Basophils Absolute 0.0  0.0 - 0.1 K/uL   COMPREHENSIVE METABOLIC PANEL     Status: Abnormal   Collection Time   05/04/12  9:39 PM      Component Value Range Comment   Sodium 140  135 - 145 mEq/L    Potassium 3.6  3.5 - 5.1 mEq/L    Chloride 104  96 - 112 mEq/L    CO2 26  19 - 32 mEq/L    Glucose, Bld 129 (*) 70 - 99  mg/dL    BUN 15  6 - 23 mg/dL    Creatinine, Ser 4.09  0.50 - 1.10 mg/dL    Calcium 9.2  8.4 - 81.1 mg/dL    Total Protein 6.8  6.0 - 8.3 g/dL    Albumin 3.4 (*) 3.5 - 5.2 g/dL    AST 19  0 - 37 U/L    ALT 18  0 - 35 U/L    Alkaline Phosphatase 49  39 - 117 U/L    Total Bilirubin 0.2 (*) 0.3 - 1.2 mg/dL    GFR calc non Af Amer 63 (*) >90 mL/min    GFR calc Af Amer 74 (*) >90 mL/min   PROTIME-INR     Status: Normal   Collection Time   05/04/12  9:39 PM      Component Value Range Comment   Prothrombin Time 13.4  11.6 - 15.2 seconds    INR 1.00  0.00 - 1.49   APTT     Status: Normal   Collection Time   05/04/12  9:39 PM      Component Value Range Comment   aPTT 37  24 - 37 seconds   POCT I-STAT TROPONIN I     Status: Normal   Collection Time   05/04/12  9:43 PM      Component Value Range Comment   Troponin i, poc 0.00  0.00 - 0.08 ng/mL    Comment 3            URINALYSIS, ROUTINE W REFLEX MICROSCOPIC     Status: Abnormal   Collection Time   05/04/12 10:44 PM      Component Value Range Comment   Color, Urine YELLOW  YELLOW    APPearance CLOUDY (*) CLEAR    Specific Gravity, Urine 1.016  1.005 - 1.030    pH 7.5  5.0 - 8.0    Glucose, UA NEGATIVE  NEGATIVE mg/dL    Hgb urine dipstick NEGATIVE  NEGATIVE    Bilirubin Urine NEGATIVE   NEGATIVE    Ketones, ur NEGATIVE  NEGATIVE mg/dL    Protein, ur NEGATIVE  NEGATIVE mg/dL    Urobilinogen, UA 0.2  0.0 - 1.0 mg/dL    Nitrite NEGATIVE  NEGATIVE    Leukocytes, UA NEGATIVE  NEGATIVE MICROSCOPIC NOT DONE ON URINES WITH NEGATIVE PROTEIN, BLOOD, LEUKOCYTES, NITRITE, OR GLUCOSE <1000 mg/dL.  BASIC METABOLIC PANEL     Status: Abnormal   Collection Time   05/05/12  1:15 AM      Component Value Range Comment   Sodium 139  135 - 145 mEq/L    Potassium 3.6  3.5 - 5.1 mEq/L    Chloride 102  96 - 112 mEq/L    CO2 26  19 - 32 mEq/L    Glucose, Bld 146 (*) 70 - 99 mg/dL    BUN 13  6 - 23 mg/dL    Creatinine, Ser 9.14  0.50 - 1.10 mg/dL    Calcium 9.3  8.4 - 78.2 mg/dL    GFR calc non Af Amer 73 (*) >90 mL/min    GFR calc Af Amer 85 (*) >90 mL/min   CBC     Status: Abnormal   Collection Time   05/05/12  1:15 AM      Component Value Range Comment   WBC 14.5 (*) 4.0 - 10.5 K/uL    RBC 4.07  3.87 - 5.11 MIL/uL    Hemoglobin 13.5  12.0 - 15.0 g/dL  HCT 38.5  36.0 - 46.0 %    MCV 94.6  78.0 - 100.0 fL    MCH 33.2  26.0 - 34.0 pg    MCHC 35.1  30.0 - 36.0 g/dL    RDW 16.1  09.6 - 04.5 %    Platelets 159  150 - 400 K/uL   CARDIAC PANEL(CRET KIN+CKTOT+MB+TROPI)     Status: Normal   Collection Time   05/05/12  1:15 AM      Component Value Range Comment   Total CK 80  7 - 177 U/L    CK, MB 2.3  0.3 - 4.0 ng/mL    Troponin I <0.30  <0.30 ng/mL    Relative Index RELATIVE INDEX IS INVALID  0.0 - 2.5   GLUCOSE, CAPILLARY     Status: Abnormal   Collection Time   05/05/12  4:02 AM      Component Value Range Comment   Glucose-Capillary 135 (*) 70 - 99 mg/dL     Dg Hip Complete Left  05/04/2012  *RADIOLOGY REPORT*  Clinical Data: Status post fall; obvious left hip deformity, with left hip pain.  LEFT HIP - COMPLETE 2+ VIEW  Comparison: None.  Findings: There is a mildly displaced subcapital fracture involving the left femoral neck, with mild shortening at the fracture site. No  additional fractures are seen.  The left femoral head remains seated at the acetabulum.  The patient's right hip hemiarthroplasty is grossly unremarkable in appearance.  The sacroiliac joints are within normal limits.  Mild degenerative change is noted at the lower lumbar spine.  No significant soft tissue abnormalities are characterized.  IMPRESSION: Mildly displaced subcapital fracture involving the left femoral neck, with mild shortening at the fracture site.  Original Report Authenticated By: Tonia Ghent, M.D.   Dg Chest Port 1 View  05/04/2012  *RADIOLOGY REPORT*  Clinical Data: Preoperative chest radiograph for left hip fracture. History of diabetes.  PORTABLE CHEST - 1 VIEW  Comparison: Chest radiograph performed 02/25/2012  Findings: The lungs are well-aerated.  Mild vascular congestion is noted.  There is no evidence of focal opacification, pleural effusion or pneumothorax.  The cardiomediastinal silhouette is within normal limits.  No acute osseous abnormalities are seen.  IMPRESSION: Mild vascular congestion noted; lungs remain grossly clear.  No displaced rib fractures seen.  Original Report Authenticated By: Tonia Ghent, M.D.    Review of Systems  Constitutional: Negative.   HENT: Negative.   Eyes:       Macular degeneration bilateral  Respiratory: Negative.   Cardiovascular: Negative for chest pain and PND.       History of fluid overload s/p cholecysectomy  Gastrointestinal: Positive for heartburn and constipation.  Genitourinary: Negative.   Musculoskeletal: Positive for joint pain.       Left hip pain  Skin: Negative.   Neurological: Positive for dizziness. Negative for seizures and loss of consciousness.       Notes dizziness when 1 st standing and balance issues uses cane  Endo/Heme/Allergies: Negative.   Psychiatric/Behavioral: Negative.    Blood pressure 157/71, pulse 79, temperature 98.9 F (37.2 C), temperature source Oral, resp. rate 20, height 5\' 10"  (1.778 m),  weight 87.998 kg (194 lb), SpO2 92.00%. Physical Exam  Constitutional: She is oriented to person, place, and time. She appears well-developed and well-nourished.  HENT:  Head: Normocephalic.  Eyes: EOM are normal.  Cardiovascular: Normal rate, regular rhythm and intact distal pulses.  Exam reveals no gallop and no friction rub.  No murmur heard. Respiratory: Effort normal and breath sounds normal. She has no wheezes. She has no rales.  GI: Soft. Bowel sounds are normal.  Musculoskeletal:       Left lower leg shortened and externally rotated. Bilateral lower legs non tender. GROM right hip without pain.+ EHL and FHL Bilateral Ankles non tender bilateral  Neurological: She is alert and oriented to person, place, and time.       Slightly decreased sensation left foot/ ankle to light touch. Right sensation intact to light touch throughout.  Skin: Skin is warm and dry.  Psychiatric: She has a normal mood and affect.    Assessment/Plan: Left hip mildly displaced subcapital fracture involving the left femoral neck, with mild shortening at the fracture site. Plan left hip hemiarthroplasty vs ORIF of left hip fracture. Surgery later today by Dr, [name]. NPO  CLARK,GILBERT W. 05/05/2012, 8:04 AM     Agree with above.  To OR tonight for left hip hemiarthroplasty NPO Probably will need SNF at D/C

## 2012-05-05 NOTE — Progress Notes (Signed)
Subjective: Pain in left hip-states relieved with meds. Denies shortness of breath also denies chest pain. Family at bedside. Objective: Vital signs in last 24 hours: Temp:  [97.6 F (36.4 C)-98.9 F (37.2 C)] 98.9 F (37.2 C) (06/18 0559) Pulse Rate:  [66-79] 79  (06/18 0559) Resp:  [20-22] 20  (06/18 0559) BP: (156-172)/(71-79) 157/71 mmHg (06/18 0559) SpO2:  [92 %-99 %] 92 % (06/18 0559) Weight:  [87.998 kg (194 lb)] 87.998 kg (194 lb) (06/18 0137) Last BM Date: 05/05/12 Intake/Output from previous day: 06/17 0701 - 06/18 0700 In: 450 [I.V.:450] Out: 1025 [Urine:1025] Intake/Output this shift:      General Appearance:    Alert and oriented x3, cooperative, no distress, appears stated age  Lungs:     Clear to auscultation bilaterally, respirations unlabored   Heart:    Regular rate and rhythm, S1 and S2 normal, no murmur, rub   or gallop  Abdomen:     Soft, non-tender, bowel sounds active all four quadrants,    no masses, no organomegaly  Extremities:   no cyanosis or edema  Neurologic:   CNII-XII intact, nonfocal     Weight change:   Intake/Output Summary (Last 24 hours) at 05/05/12 1619 Last data filed at 05/05/12 0547  Gross per 24 hour  Intake    450 ml  Output   1025 ml  Net   -575 ml    Lab Results:   [facility] 05/05/12 0115 05/04/12 2139  NA 139 140  K 3.6 3.6  CL 102 104  CO2 26 26  GLUCOSE 146* 129*  BUN 13 15  CREATININE 0.77 0.81  CALCIUM 9.3 9.2    Basename 05/05/12 0115 05/04/12 2139  WBC 14.5* 10.7*  HGB 13.5 13.4  HCT 38.5 38.9  PLT 159 169  MCV 94.6 95.1   PT/INR  Basename 05/04/12 2139  LABPROT 13.4  INR 1.00   ABG No results found for this basename: PHART:2,PCO2:2,PO2:2,HCO3:2 in the last 72 hours  Micro Results: Recent Results (from the past 240 hour(s))  SURGICAL PCR SCREEN     Status: Normal   Collection Time   05/05/12  2:10 PM      Component Value Range Status Comment   MRSA, PCR NEGATIVE  NEGATIVE Final    Staphylococcus aureus NEGATIVE  NEGATIVE Final    Studies/Results: Dg Hip Complete Left  05/04/2012  *RADIOLOGY REPORT*  Clinical Data: Status post fall; obvious left hip deformity, with left hip pain.  LEFT HIP - COMPLETE 2+ VIEW  Comparison: None.  Findings: There is a mildly displaced subcapital fracture involving the left femoral neck, with mild shortening at the fracture site. No additional fractures are seen.  The left femoral head remains seated at the acetabulum.  The patient's right hip hemiarthroplasty is grossly unremarkable in appearance.  The sacroiliac joints are within normal limits.  Mild degenerative change is noted at the lower lumbar spine.  No significant soft tissue abnormalities are characterized.  IMPRESSION: Mildly displaced subcapital fracture involving the left femoral neck, with mild shortening at the fracture site.  Original Report Authenticated By: Tonia Ghent, M.D.   Dg Chest Port 1 View  05/04/2012  *RADIOLOGY REPORT*  Clinical Data: Preoperative chest radiograph for left hip fracture. History of diabetes.  PORTABLE CHEST - 1 VIEW  Comparison: Chest radiograph performed 02/25/2012  Findings: The lungs are well-aerated.  Mild vascular congestion is noted.  There is no evidence of focal opacification, pleural effusion or pneumothorax.  The cardiomediastinal silhouette is  within normal limits.  No acute osseous abnormalities are seen.  IMPRESSION: Mild vascular congestion noted; lungs remain grossly clear.  No displaced rib fractures seen.  Original Report Authenticated By: Tonia Ghent, M.D.   Medications: Scheduled Meds:   . carvedilol  25 mg Oral BID WC  . chlorhexidine  60 mL Topical Once  . fentaNYL  50 mcg Intravenous Once  . insulin aspart  0-9 Units Subcutaneous Q4H  .  morphine injection  2 mg Intravenous Once  . ondansetron (ZOFRAN) IV  4 mg Intravenous Q4H  . sodium chloride  500 mL Intravenous Once  . vancomycin  1,000 mg Intravenous 60 min Pre-Op  .  vitamin A & D      . DISCONTD: sodium chloride   Intravenous STAT   Continuous Infusions:   . 0.9 % NaCl with KCl 20 mEq / L 75 mL/hr at 05/05/12 1605   PRN Meds:.acetaminophen, bisacodyl, furosemide, metoprolol, morphine injection, sodium phosphate, DISCONTD: ondansetron Assessment/Plan: Patient Active Hospital Problem List: Closed left hip fracture (05/04/2012) -pain management, surgery per Ortho planned today  DIABETES MELLITUS  -Diet controlled, sliding scale insulin and follow.   HYPERLIPIDEMIA-MIXED -Continue Zocor   DEPRESSIVE DISORDER NOT ELSEWHERE CLASSIFIED  -Continue Zoloft    HYPERTENSION, BENIGN (11/08/2008)  -continue outpatient medications  Paroxysmal atrial fibrillation Atrial fibrillation  -Rate is controlled, new Coreg follow and resume aspirin 81mg  in a.m.   GERD (11/08/2008)   -asymptomatic  Chronic systolic HF (heart failure)  -Compensated, follow and resume her Lasix beginning in a.m.  -caution with IV fluids in the perioperative      LOS: 1 day   Kason Benak C 05/05/2012, 4:19 PM

## 2012-05-05 NOTE — Anesthesia Postprocedure Evaluation (Signed)
  Anesthesia Post-op Note  Patient: [name]  Procedure(s) Performed: Procedure(s) (LRB): ARTHROPLASTY BIPOLAR HIP (Left)  Patient Location: PACU  Anesthesia Type: Spinal  Level of Consciousness: awake and alert   Airway and Oxygen Therapy: Patient Spontanous Breathing  Post-op Pain: mild  Post-op Assessment: Post-op Vital signs reviewed, Patient's Cardiovascular Status Stable, Respiratory Function Stable, Patent Airway and No signs of Nausea or vomiting  Post-op Vital Signs: stable  Complications: No apparent anesthesia complications

## 2012-05-06 DIAGNOSIS — E119 Type 2 diabetes mellitus without complications: Secondary | ICD-10-CM

## 2012-05-06 DIAGNOSIS — S72009B Fracture of unspecified part of neck of unspecified femur, initial encounter for open fracture type I or II: Secondary | ICD-10-CM

## 2012-05-06 DIAGNOSIS — I10 Essential (primary) hypertension: Secondary | ICD-10-CM

## 2012-05-06 DIAGNOSIS — I5022 Chronic systolic (congestive) heart failure: Secondary | ICD-10-CM

## 2012-05-06 LAB — CBC
Hemoglobin: 11.2 g/dL — ABNORMAL LOW (ref 12.0–15.0)
RBC: 3.46 MIL/uL — ABNORMAL LOW (ref 3.87–5.11)

## 2012-05-06 LAB — BASIC METABOLIC PANEL
Chloride: 106 mEq/L (ref 96–112)
GFR calc Af Amer: 84 mL/min — ABNORMAL LOW (ref >90)
GFR calc non Af Amer: 73 mL/min — ABNORMAL LOW (ref >90)
Glucose, Bld: 120 mg/dL — ABNORMAL HIGH (ref 70–99)
Potassium: 3.8 mEq/L (ref 3.5–5.1)
Sodium: 138 mEq/L (ref 135–145)

## 2012-05-06 LAB — GLUCOSE, CAPILLARY
Glucose-Capillary: 97 mg/dL (ref 70–99)
Glucose-Capillary: 122 mg/dL — ABNORMAL HIGH (ref 70–99)

## 2012-05-06 MED ORDER — HYDROCODONE-ACETAMINOPHEN 5-325 MG PO TABS
1.0000 | ORAL_TABLET | ORAL | Status: DC | PRN
  Administered 2012-05-06 – 2012-05-07 (×4): 1 via ORAL
  Filled 2012-05-06 (×4): qty 1

## 2012-05-06 MED ORDER — PSYLLIUM 95 % PO PACK
1.0000 | PACK | Freq: Every day | ORAL | Status: DC
  Filled 2012-05-06: qty 1

## 2012-05-06 MED ORDER — ONDANSETRON HCL 4 MG PO TABS: 4.0000 mg | ORAL_TABLET | Freq: Three times a day (TID) | ORAL | Status: DC | PRN

## 2012-05-06 MED ORDER — POLYETHYLENE GLYCOL 3350 17 G PO PACK
17.0000 g | PACK | Freq: Two times a day (BID) | ORAL | Status: DC
  Administered 2012-05-06 – 2012-05-07 (×2): 17 g via ORAL

## 2012-05-06 NOTE — Evaluation (Signed)
Physical Therapy Evaluation Patient Details Name: [name] MRN: [id number] DOB: [birth year] Today's Date: 05/06/2012 Time: 4098-1191 PT Time Calculation (min): 41 min  PT Assessment / Plan / Recommendation Clinical Impression  Pt presents with closed femur fracture s/p L bipolar hip arthroplasty.  Presents today with decreased overall strength, mobility and endurance.  Only able to tolerate some steps from bed to chair, however requires +2 assist with MAX cuing for safety and technique due to pt attempting to sit with no chair behind her.  Pt will benefit from skilled PT in acute venue to address deficits.  PT recommends SNF for follow up therapy at D/C to increase pt safety.     PT Assessment  Patient needs continued PT services    Follow Up Recommendations  Skilled nursing facility    Barriers to Discharge Decreased caregiver support      lEquipment Recommendations  Defer to next venue    Recommendations for Other Services OT consult   Frequency 7X/week    Precautions / Restrictions Precautions Precautions: Posterior Hip Precaution Booklet Issued: Yes (comment) Precaution Comments: Provided education and handout on precautions Restrictions Weight Bearing Restrictions: No Other Position/Activity Restrictions: WBAT   Pertinent Vitals/Pain 3/10      Mobility  Bed Mobility Bed Mobility: Supine to Sit;Sitting - Scoot to Edge of Bed Supine to Sit: 2: Max assist;HOB elevated;With rails Sitting - Scoot to Edge of Bed: 3: Mod assist;2: Max assist;With rail Details for Bed Mobility Assistance: Provided assist for B LE off of bed and for trunk to attain sitting position.  Max verbal cuing for UE placement to self assist trunk.  Noted pt very fatigued and unable to fully follow all commands due to increased pain.   Transfers Transfers: Sit to Stand;Stand to Sit;Stand Pivot Transfers Sit to Stand: 1: +2 Total assist;From elevated surface;With upper extremity assist;From  bed Sit to Stand: Patient Percentage: 40% Stand to Sit: 1: +2 Total assist;With upper extremity assist;With armrests;To chair/3-in-1 Stand to Sit: Patient Percentage: 20% Stand Pivot Transfers: 1: +2 Total assist Details for Transfer Assistance: +2 assist to rise from bed due to increased pain and weakness.  Max verbal and manual cuing for hand placement when sitting standing due to pt attempting to sit when not at chair.  Only able to take some steps from bed  to chair.  Pt became very weak and unable to use UE on RW to assist with transfer.  Provided max cuing for safety, technique, upright posture in order to get to chair safely.  Ambulation/Gait Ambulation/Gait Assistance: Not tested (comment) Gait Pattern: Trunk flexed;Step-to pattern Stairs: No Wheelchair Mobility Wheelchair Mobility: No    Exercises     PT Diagnosis: Difficulty walking;Abnormality of gait;Generalized weakness;Acute pain  PT Problem List: Decreased strength;Decreased range of motion;Decreased activity tolerance;Decreased balance;Decreased mobility;Decreased knowledge of use of DME;Decreased safety awareness;Decreased knowledge of precautions;Pain PT Treatment Interventions: DME instruction;Gait training;Functional mobility training;Therapeutic activities;Therapeutic exercise;Balance training;Patient/family education   PT Goals Acute Rehab PT Goals PT Goal Formulation: With patient Time For Goal Achievement: 05/13/12 Potential to Achieve Goals: Fair Pt will go Supine/Side to Sit: with min assist PT Goal: Supine/Side to Sit - Progress: Goal set today Pt will go Sit to Supine/Side: with min assist PT Goal: Sit to Supine/Side - Progress: Goal set today Pt will go Sit to Stand: with min assist PT Goal: Sit to Stand - Progress: Goal set today Pt will Transfer Bed to Chair/Chair to Bed: with min assist PT Transfer Goal: Bed to  Chair/Chair to Bed - Progress: Goal set today Pt will Ambulate: 16 - 50 feet;with mod  assist;with least restrictive assistive device PT Goal: Ambulate - Progress: Goal set today  Visit Information  Last PT Received On: 05/06/12 Assistance Needed: +2    Subjective Data  Subjective: I've been so sleepy Patient Stated Goal: to get my pain under control   Prior Functioning  Home Living Lives With: Alone Type of Home: Apartment Home Access: Level entry Home Layout: One level Bathroom Shower/Tub: Health visitor: Handicapped height Home Adaptive Equipment: Grab bars in shower;Walker - rolling;Straight cane Prior Function Level of Independence: Independent Able to Take Stairs?: Yes Driving: No Vocation: Retired Musician: No difficulties    Cognition  Overall Cognitive Status: Appears within functional limits for tasks assessed/performed Arousal/Alertness: Awake/alert Orientation Level: Appears intact for tasks assessed Behavior During Session: [facility] for tasks performed    Extremity/Trunk Assessment Right Lower Extremity Assessment RLE ROM/Strength/Tone: WFL for tasks assessed RLE Coordination: WFL - gross motor Left Lower Extremity Assessment LLE ROM/Strength/Tone: Unable to fully assess;Due to pain LLE Coordination: WFL - gross motor Trunk Assessment Trunk Assessment: Kyphotic   Balance Balance Balance Assessed: Yes Static Sitting Balance Static Sitting - Balance Support: Feet supported;Bilateral upper extremity supported Static Sitting - Level of Assistance: 4: Min assist;3: Mod assist Static Sitting - Comment/# of Minutes: Required Mod assist initially due to pt unable to properly use UE to support self in sitting, however with cuing could finally correct and sit EOB x approx 10 mins.   End of Session PT - End of Session Equipment Utilized During Treatment: Gait belt Activity Tolerance: Patient limited by fatigue;Patient limited by pain Patient left: in chair;with call bell/phone within reach;with family/visitor  present Nurse Communication: Mobility status;Need for lift equipment   PageMeribeth Mattes 05/06/2012, 12:02 PM

## 2012-05-06 NOTE — Progress Notes (Signed)
  Subjective: 1 Day Post-Op Procedure(s) (LRB): ARTHROPLASTY BIPOLAR HIP (Left)   Patient reports pain as mild, pain well controlled with medication. She states her pain now is so much better than the pain prior to surgery. No events throughout the night.  Objective:   VITALS:   Filed Vitals:   05/06/12 1005  BP: 115/66  Pulse: 83  Temp: 99.3 F (37.4 C)  Resp: 16    Neurovascular intact Dorsiflexion/Plantar flexion intact Incision: dressing C/D/I No cellulitis present Compartment soft  LABS  Basename 05/06/12 0411 05/05/12 0115 05/04/12 2139  HGB 11.2* 13.5 13.4  HCT 33.5* 38.5 38.9  WBC 9.4 14.5* 10.7*  PLT 122* 159 169     Basename 05/06/12 0411 05/05/12 0115 05/04/12 2139  NA 138 139 140  K 3.8 3.6 3.6  BUN 10 13 15   CREATININE 0.79 0.77 0.81  GLUCOSE 120* 146* 129*     Assessment/Plan: 1 Day Post-Op Procedure(s) (LRB): ARTHROPLASTY BIPOLAR HIP (Left)   Foley cath d/c'ed Advance diet Up with therapy D/C IV fluids Discharge to SNF eventually   Anastasio Auerbach. Weronika Birch   PAC  05/06/2012, 10:51 AM

## 2012-05-06 NOTE — Progress Notes (Signed)
Clinical Social Work Department CLINICAL SOCIAL WORK PLACEMENT NOTE 05/06/2012  Patient:  [name], [name]  Account Number:  [id number] Admit date:  05/04/2012  Clinical Social Worker:  Cori Razor, LCSW  Date/time:  05/06/2012 07:18 AM  Clinical Social Work is seeking post-discharge placement for this patient at the following level of care:   SKILLED NURSING   (*CSW will update this form in Epic as items are completed)   05/05/2012  Patient/family provided with [facility] Department of Clinical Social Work's list of facilities offering this level of care within the geographic area requested by the patient (or if unable, by the patient's family).  05/05/2012  Patient/family informed of their freedom to choose among providers that offer the needed level of care, that participate in Medicare, Medicaid or managed care program needed by the patient, have an available bed and are willing to accept the patient.  05/05/2012  Patient/family informed of [facility]' ownership interest in [facility], as well as of the fact that they are under no obligation to receive care at this facility.  PASARR submitted to EDS on 05/06/12 PASARR number received from EDS on   FL2 transmitted to all facilities in geographic area requested by pt/family on  05/06/2012 FL2 transmitted to all facilities within larger geographic area on   Patient informed that his/her managed care company has contracts with or will negotiate with  certain facilities, including the following:     Patient/family informed of bed offers received:   Patient chooses bed at  Physician recommends and patient chooses bed at    Patient to be transferred to  on   Patient to be transferred to facility by   The following physician request were entered in Epic:   Additional Comments:  Cori Razor LCSW [phone]

## 2012-05-06 NOTE — Progress Notes (Signed)
Physical Therapy Treatment Patient Details Name: NKENGE SONNTAG MRN: [id number] DOB: Apr 13, 1923 Today's Date: 05/06/2012 Time: 7829-5621 PT Time Calculation (min): 15 min  PT Assessment / Plan / Recommendation Comments on Treatment Session  Pt did somewhat better with transfer this afternoon, however still requires +2 assist with max cuing to perform safe transfer.  Pt refused to perform exercises in bed due to fatigue.     Follow Up Recommendations  Skilled nursing facility    Barriers to Discharge Decreased caregiver support      Equipment Recommendations  Defer to next venue    Recommendations for Other Services OT consult  Frequency 7X/week   Plan Discharge plan remains appropriate    Precautions / Restrictions Precautions Precautions: Posterior Hip Restrictions Weight Bearing Restrictions: No Other Position/Activity Restrictions: WBAT   Pertinent Vitals/Pain 6/10    Mobility  Bed Mobility Bed Mobility: Sit to Supine Supine to Sit: 2: Max assist;HOB elevated;With rails Sitting - Scoot to Edge of Bed: 3: Mod assist;2: Max assist;With rail Sit to Supine: 1: +2 Total assist Sit to Supine: Patient Percentage: 40% Details for Bed Mobility Assistance: Provided assist for B LE and for controlled descent of trunk with cues for safety and technique.  Transfers Transfers: Sit to Stand;Stand to Sit;Stand Pivot Transfers Sit to Stand: 1: +2 Total assist;With upper extremity assist;With armrests;From chair/3-in-1 Sit to Stand: Patient Percentage: 50% Stand to Sit: 1: +2 Total assist;With upper extremity assist;To bed Stand to Sit: Patient Percentage: 50% Stand Pivot Transfers: 1: +2 Total assist Stand Pivot Transfers: Patient Percentage: 50% Details for Transfer Assistance: Pt with somewhat improved mobility during pm session today.  Continues to require +2 assist to maintain upright stance with max cuing for safety, technique and positioning.   Ambulation/Gait Ambulation/Gait Assistance: 1: +2 Total assist Ambulation/Gait: Patient Percentage: 50% Ambulation Distance (Feet): 3 Feet Assistive device: Rolling walker Ambulation/Gait Assistance Details: see transfer details above Gait Pattern: Trunk flexed;Step-to pattern Stairs: No Wheelchair Mobility Wheelchair Mobility: No    Exercises     PT Diagnosis: Difficulty walking;Abnormality of gait;Generalized weakness;Acute pain  PT Problem List: Decreased strength;Decreased range of motion;Decreased activity tolerance;Decreased balance;Decreased mobility;Decreased knowledge of use of DME;Decreased safety awareness;Decreased knowledge of precautions;Pain PT Treatment Interventions: DME instruction;Gait training;Functional mobility training;Therapeutic activities;Therapeutic exercise;Balance training;Patient/family education   PT Goals Acute Rehab PT Goals PT Goal Formulation: With patient Time For Goal Achievement: 05/13/12 Potential to Achieve Goals: Fair Pt will go Supine/Side to Sit: with min assist PT Goal: Supine/Side to Sit - Progress: Goal set today Pt will go Sit to Supine/Side: with min assist PT Goal: Sit to Supine/Side - Progress: Progressing toward goal Pt will go Sit to Stand: with min assist PT Goal: Sit to Stand - Progress: Progressing toward goal Pt will Transfer Bed to Chair/Chair to Bed: with min assist PT Transfer Goal: Bed to Chair/Chair to Bed - Progress: Progressing toward goal Pt will Ambulate: 16 - 50 feet;with mod assist;with least restrictive assistive device PT Goal: Ambulate - Progress: Progressing toward goal  Visit Information  Last PT Received On: 05/06/12 Assistance Needed: +2    Subjective Data  Subjective: I'm just so weak Patient Stated Goal: to get my pain under control   Cognition  Overall Cognitive Status: Appears within functional limits for tasks assessed/performed Arousal/Alertness: Awake/alert Orientation Level: Appears intact  for tasks assessed Behavior During Session: [facility] for tasks performed    Balance  Balance Balance Assessed: Yes Static Sitting Balance Static Sitting - Balance Support: Feet supported;Bilateral upper extremity  supported Static Sitting - Level of Assistance: 4: Min assist;3: Mod assist Static Sitting - Comment/# of Minutes: Required Mod assist initially due to pt unable to properly use UE to support self in sitting, however with cuing could finally correct and sit EOB x approx 10 mins.   End of Session PT - End of Session Equipment Utilized During Treatment: Gait belt Activity Tolerance: Patient limited by fatigue;Patient limited by pain Patient left: in bed;with call bell/phone within reach Nurse Communication: Mobility status    Page, Meribeth Mattes 05/06/2012, 3:07 PM

## 2012-05-06 NOTE — Progress Notes (Signed)
[facility]                                                                                Patient Demographics  [name], is a 78 y.o. female  KGM:[id number]  UYQ:[id number]  DOB - 1922/12/07  Admit date - 05/04/2012  Admitting Physician Vania Rea, MD  Outpatient Primary MD for the patient is [name], MD  LOS - 2   Chief Complaint  Patient presents with  . Fall  . Hip Pain        Subjective:   Alma Friendly today has, No headache, No chest pain, No abdominal pain - No Nausea, No new weakness tingling or numbness, No Cough - SOB.    Objective:   Filed Vitals:   05/06/12 0129 05/06/12 0226 05/06/12 0419 05/06/12 0843  BP: 117/71 122/71 126/75 155/76  Pulse: 63 66 70 78  Temp: 98.4 F (36.9 C) 97.4 F (36.3 C) 98.6 F (37 C)   TempSrc: Oral Oral Oral   Resp: 16 15 16    Height:      Weight:      SpO2: 98% 98% 98%     Wt Readings from Last 3 Encounters:  05/05/12 87.998 kg (194 lb)  05/05/12 87.998 kg (194 lb)  01/31/12 90.719 kg (200 lb)     Intake/Output Summary (Last 24 hours) at 05/06/12 1035 Last data filed at 05/06/12 0500  Gross per 24 hour  Intake 2850.42 ml  Output    940 ml  Net 1910.42 ml    Exam Awake Alert, Oriented *3, No new F.N deficits, Normal affect [place].AT,PERRAL Supple Neck,No JVD, No cervical lymphadenopathy appriciated.  Symmetrical Chest wall movement, Good air movement bilaterally, CTAB RRR,No Gallops,Rubs or new Murmurs, No Parasternal Heave +ve B.Sounds, Abd Soft, Non tender, No organomegaly appriciated, No rebound -guarding or rigidity. No Cyanosis, Clubbing or edema, No new Rash or bruise     Data Review  CBC  Lab 05/06/12 0411 05/05/12 0115 05/04/12 2139  WBC 9.4 14.5* 10.7*  HGB 11.2* 13.5 13.4  HCT 33.5* 38.5 38.9  PLT 122* 159 169  MCV 96.8 94.6 95.1  MCH 32.4 33.2 32.8  MCHC 33.4 35.1 34.4  RDW 12.8 12.5 12.4  LYMPHSABS -- -- 1.5  MONOABS -- -- 0.9  EOSABS -- --  0.5  BASOSABS -- -- 0.0  BANDABS -- -- --    Chemistries   Lab 05/06/12 0411 05/05/12 0115 05/04/12 2139  NA 138 139 140  K 3.8 3.6 3.6  CL 106 102 104  CO2 25 26 26   GLUCOSE 120* 146* 129*  BUN 10 13 15   CREATININE 0.79 0.77 0.81  CALCIUM 8.3* 9.3 9.2  MG -- -- --  AST -- -- 19  ALT -- -- 18  ALKPHOS -- -- 49  BILITOT -- -- 0.2*   ------------------------------------------------------------------------------------------------------------------ estimated creatinine clearance is 59.7 ml/min (by C-G formula based on Cr of 0.79). ------------------------------------------------------------------------------------------------------------------  [facility] 05/05/12 0115  HGBA1C 5.9*   ------------------------------------------------------------------------------------------------------------------ No results found for this basename: CHOL:2,HDL:2,LDLCALC:2,TRIG:2,CHOLHDL:2,LDLDIRECT:2 in the last 72 hours ------------------------------------------------------------------------------------------------------------------ No results found for this basename: TSH,T4TOTAL,FREET3,T3FREE,THYROIDAB in the last 72 hours ------------------------------------------------------------------------------------------------------------------ No results found for this basename: VITAMINB12:2,FOLATE:2,FERRITIN:2,TIBC:2,IRON:2,RETICCTPCT:2  in the last 72 hours  Coagulation profile  Lab 05/04/12 2139  INR 1.00  PROTIME --    No results found for this basename: DDIMER:2 in the last 72 hours  Cardiac Enzymes  Lab 05/05/12 1709 05/05/12 0914 05/05/12 0115  CKMB 2.2 2.2 2.3  TROPONINI <0.30 <0.30 <0.30  MYOGLOBIN -- -- --   ------------------------------------------------------------------------------------------------------------------ No components found with this basename: POCBNP:3  Micro Results Recent Results (from the past 240 hour(s))  SURGICAL PCR SCREEN     Status: Normal   Collection  Time   05/05/12  2:10 PM      Component Value Range Status Comment   MRSA, PCR NEGATIVE  NEGATIVE Final    Staphylococcus aureus NEGATIVE  NEGATIVE Final     Radiology Reports Dg Hip Complete Left  05/04/2012  *RADIOLOGY REPORT*  Clinical Data: Status post fall; obvious left hip deformity, with left hip pain.  LEFT HIP - COMPLETE 2+ VIEW  Comparison: None.  Findings: There is a mildly displaced subcapital fracture involving the left femoral neck, with mild shortening at the fracture site. No additional fractures are seen.  The left femoral head remains seated at the acetabulum.  The patient's right hip hemiarthroplasty is grossly unremarkable in appearance.  The sacroiliac joints are within normal limits.  Mild degenerative change is noted at the lower lumbar spine.  No significant soft tissue abnormalities are characterized.  IMPRESSION: Mildly displaced subcapital fracture involving the left femoral neck, with mild shortening at the fracture site.  Original Report Authenticated By: Tonia Ghent, M.D.   Dg Pelvis Portable  05/06/2012  *RADIOLOGY REPORT*  Clinical Data: Postop radiograph, status post a 12 hip arthroplasty.  PORTABLE PELVIS  Comparison: Pelvis radiograph performed 09/02/2009  Findings: There has been interval placement of a left hip prosthesis, noted overlying expected alignment, without evidence of loosening or fracture.  Minimal scattered surrounding tiny osseous fragments are noted, with overlying soft tissue air.  The patient's right hip prosthesis is grossly unremarkable in appearance.  The sacroiliac joints are grossly unremarkable.  The visualized bowel gas pattern is within normal limits.  IMPRESSION: Left hip prosthesis noted at expected alignment, without evidence of loosening or fracture.  Original Report Authenticated By: Tonia Ghent, M.D.   Dg Chest Port 1 View  05/04/2012  *RADIOLOGY REPORT*  Clinical Data: Preoperative chest radiograph for left hip fracture. History of  diabetes.  PORTABLE CHEST - 1 VIEW  Comparison: Chest radiograph performed 02/25/2012  Findings: The lungs are well-aerated.  Mild vascular congestion is noted.  There is no evidence of focal opacification, pleural effusion or pneumothorax.  The cardiomediastinal silhouette is within normal limits.  No acute osseous abnormalities are seen.  IMPRESSION: Mild vascular congestion noted; lungs remain grossly clear.  No displaced rib fractures seen.  Original Report Authenticated By: Tonia Ghent, M.D.    Scheduled Meds:   . aspirin  81 mg Oral Daily  . carvedilol  25 mg Oral BID WC  . clindamycin (CLEOCIN) IV  600 mg Intravenous Q6H  . docusate sodium  100 mg Oral BID  . enoxaparin  40 mg Subcutaneous Q24H  . furosemide  40 mg Oral Daily  . insulin aspart  0-15 Units Subcutaneous TID WC  . ondansetron (ZOFRAN) IV  4 mg Intravenous Q4H  . polyethylene glycol  17 g Oral BID  . DISCONTD: furosemide  40 mg Oral Daily  . DISCONTD: insulin aspart  0-9 Units Subcutaneous Q4H  . DISCONTD: vancomycin  1,000 mg Intravenous 60 min  Pre-Op   Continuous Infusions:   . DISCONTD: 0.9 % NaCl with KCl 20 mEq / L 75 mL/hr at 05/05/12 1605  . DISCONTD: lactated ringers    . DISCONTD: sodium chloride 0.9 % 1,000 mL with potassium chloride 10 mEq infusion 100 mL/hr at 05/06/12 0111   PRN Meds:.acetaminophen, acetaminophen, acetaminophen, alum & mag hydroxide-simeth, bisacodyl, furosemide, HYDROcodone-acetaminophen, menthol-cetylpyridinium, methocarbamol (ROBAXIN) IV, methocarbamol, metoCLOPramide (REGLAN) injection, metoCLOPramide, ondansetron (ZOFRAN) IV, ondansetron, phenol, sodium phosphate, DISCONTD: 0.9 % irrigation (POUR BTL), DISCONTD:  HYDROmorphone (DILAUDID) injection, DISCONTD: meperidine (DEMEROL) injection DISCONTD: metoprolol, DISCONTD:  morphine injection, DISCONTD: promethazine, DISCONTD: sodium phosphate  Assessment & Plan   1. Mech Fall causing Closed left hip fracture - status post Left hip  hemiarthroplasty utilizing DePuy component, size 5 standard Tri-Lock stem with a 54 unipolar ball with a +5 adapter by Dr [name] on 05-05-2012, commence PT-OT, placement, Lovenox, monitor plts.  Patient noted to be on clindamycin per orthopedics will defer it to orthopedics to stop it when appropriate.   2.DM-2, diet control continue ISS here  CBG (last 3)   Basename 05/06/12 0732 05/06/12 0417 05/05/12 2352  GLUCAP 122* 97 94     3. Chronic combined systolic and diastolic CHF last echo done in 2011 shows mildly depressed EF and grade 1 diastolic dysfunction. Patient currently compensated, eating oral diet, will discontinue IV fluids, resume home dose Lasix.   4. H/O Left bundle branch block, hypertension  -  no acute issues continue home medication Coreg and monitor clinically.   5. History of paroxysmal atrial fibrillation. Continue Coreg at home dose and monitor.    6. Mild postop anemia due to mild blood loss during surgery along with dilution no need for transfusions will monitor an outpatient followup.    DVT Prophylaxis  Lovenox   Lab Results  Component Value Date   PLT 122* 05/06/2012          Procedures  on 05-05-12 By Dr [name] - Left hip hemiarthroplasty utilizing DePuy component, size 5 standard Tri-Lock stem with a 54 unipolar ball with a +5 adapter.   Consults Romeo Apple M.D on 05/06/2012 at 10:35 AM  Between 7am to 7pm - Pager - [phone]  After 7pm go to [email or web address] - password [email or web address]  And look for the night coverage person covering for me after hours  [facility] Office  [phone]

## 2012-05-06 NOTE — Progress Notes (Signed)
Utilization review completed.  

## 2012-05-07 ENCOUNTER — Encounter (HOSPITAL_COMMUNITY): Payer: Self-pay | Admitting: Orthopedic Surgery

## 2012-05-07 DIAGNOSIS — I5022 Chronic systolic (congestive) heart failure: Secondary | ICD-10-CM

## 2012-05-07 DIAGNOSIS — S72009B Fracture of unspecified part of neck of unspecified femur, initial encounter for open fracture type I or II: Secondary | ICD-10-CM

## 2012-05-07 DIAGNOSIS — E119 Type 2 diabetes mellitus without complications: Secondary | ICD-10-CM

## 2012-05-07 DIAGNOSIS — I10 Essential (primary) hypertension: Secondary | ICD-10-CM

## 2012-05-07 LAB — GLUCOSE, CAPILLARY: Glucose-Capillary: 115 mg/dL — ABNORMAL HIGH (ref 70–99)

## 2012-05-07 LAB — CBC
HCT: 28.5 % — ABNORMAL LOW (ref 36.0–46.0)
MCH: 32.9 pg (ref 26.0–34.0)
MCHC: 34.7 g/dL (ref 30.0–36.0)
MCV: 94.7 fL (ref 78.0–100.0)
RDW: 12.5 % (ref 11.5–15.5)

## 2012-05-07 MED ORDER — DSS 100 MG PO CAPS: 100.0000 mg | ORAL_CAPSULE | Freq: Two times a day (BID) | ORAL | Status: AC | PRN

## 2012-05-07 MED ORDER — HYDROCODONE-ACETAMINOPHEN 5-325 MG PO TABS: 1.0000 | ORAL_TABLET | ORAL | Status: AC | PRN

## 2012-05-07 MED ORDER — ENOXAPARIN SODIUM 40 MG/0.4ML ~~LOC~~ SOLN: 40.0000 mg | SUBCUTANEOUS | Status: DC

## 2012-05-07 NOTE — Progress Notes (Signed)
Clinical Social Work Department CLINICAL SOCIAL WORK PLACEMENT NOTE 05/07/2012  Patient:  [name], [name]  Account Number:  [id number] Admit date:  05/04/2012  Clinical Social Worker:  Cori Razor, LCSW  Date/time:  05/06/2012 07:18 AM  Clinical Social Work is seeking post-discharge placement for this patient at the following level of care:   SKILLED NURSING   (*CSW will update this form in Epic as items are completed)   05/05/2012  Patient/family provided with [facility] Department of Clinical Social Work's list of facilities offering this level of care within the geographic area requested by the patient (or if unable, by the patient's family).  05/05/2012  Patient/family informed of their freedom to choose among providers that offer the needed level of care, that participate in Medicare, Medicaid or managed care program needed by the patient, have an available bed and are willing to accept the patient.  05/05/2012  Patient/family informed of [facility]' ownership interest in [facility], as well as of the fact that they are under no obligation to receive care at this facility.  PASARR submitted to EDS on  PASARR number received from EDS on   FL2 transmitted to all facilities in geographic area requested by pt/family on  05/06/2012 FL2 transmitted to all facilities within larger geographic area on   Patient informed that his/her managed care company has contracts with or will negotiate with  certain facilities, including the following:     Patient/family informed of bed offers received:  05/06/2012 Patient chooses bed at [facility] Physician recommends and patient chooses bed at    Patient to be transferred to [facility] on  05/07/2012 Patient to be transferred to facility by [facility]  The following physician request were entered in Epic:   Additional Comments:  Cori Razor LCSW [phone]

## 2012-05-07 NOTE — Progress Notes (Signed)
Physical Therapy Treatment Patient Details Name: [name] MRN: [id number] DOB: 1923/10/24 Today's Date: 05/07/2012 Time: 4098-1191 PT Time Calculation (min): 21 min  PT Assessment / Plan / Recommendation Comments on Treatment Session  Pt requiring increased assistance this a.m. Unable to transfer to recliner. Pt requested to return to supine after multiple attempts at standing. Will need SNF    Follow Up Recommendations  Skilled nursing facility    Barriers to Discharge        Equipment Recommendations  Defer to next venue    Recommendations for Other Services    Frequency 7X/week   Plan Discharge plan remains appropriate    Precautions / Restrictions Precautions Precautions: Posterior Hip Restrictions Weight Bearing Restrictions: No LLE Weight Bearing: Weight bearing as tolerated   Pertinent Vitals/Pain     Mobility  Bed Mobility Supine to Sit: 1: +2 Total assist Supine to Sit: Patient Percentage: 30% Sit to Supine: 1: +2 Total assist Sit to Supine: Patient Percentage: 0% Details for Bed Mobility Assistance: Multimodal cues for safety, technique, hand placement. Increased time. Assist for bil LEs off bed and trunk to upright. Utilized bedpad for scooting, positioning Transfers Transfers: Sit to Stand;Stand to Sit Sit to Stand: 1: +2 Total assist Sit to Stand: Patient Percentage: 40% Stand to Sit: 1: +2 Total assist Stand to Sit: Patient Percentage: 40% Details for Transfer Assistance: Mulimodal cues for safety, technique, hand placement, posture. x 3 attempts. Pt only able to stand for 10-15 seconds. Fatigues very easily. Unable to take step with R LE to allow for pivot to recliner. Pt c/o dizziness, nausea and requested to return to supine.  Ambulation/Gait Ambulation/Gait Assistance: Not tested (comment)    Exercises     PT Diagnosis:    PT Problem List:   PT Treatment Interventions:     PT Goals Acute Rehab PT Goals PT Goal: Supine/Side to Sit -  Progress: Progressing toward goal PT Goal: Sit to Supine/Side - Progress: Progressing toward goal PT Goal: Sit to Stand - Progress: Progressing toward goal  Visit Information  Last PT Received On: 05/07/12 Assistance Needed: +2 PT/OT Co-Evaluation/Treatment: Yes    Subjective Data  Subjective: "The pain is just so bad. It makes me nauseous, dizzy" Patient Stated Goal: Less pain.    Cognition  Overall Cognitive Status: Appears within functional limits for tasks assessed/performed Arousal/Alertness: Awake/alert Orientation Level: Appears intact for tasks assessed Behavior During Session: [facility] for tasks performed    Balance  Static Sitting Balance Static Sitting - Balance Support: Feet supported;Bilateral upper extremity supported Static Sitting - Level of Assistance: 4: Min assist Static Sitting - Comment/# of Minutes: Sat EOB 7-8 minutes. VCs for posture. Assist to stabilize in sitting, especially after standing activity.   End of Session PT - End of Session Equipment Utilized During Treatment: Gait belt Activity Tolerance: Patient limited by fatigue;Patient limited by pain Patient left: in bed;with call bell/phone within reach    Rebeca Alert [facility] 05/07/2012, 10:30 AM [phone]

## 2012-05-07 NOTE — Discharge Summary (Signed)
[facility]                                                                                   [name], is a 78 y.o. female  DOB [birth year]  MRN [id number].  Admission date:  05/04/2012  Discharge Date:  05/07/2012  Primary MD  [name], MD  Admitting Physician  Vania Rea, MD  Admission Diagnosis  Closed left hip fracture [820.8] FALL/HIP PAIN left hip fracture  Discharge Diagnosis     Principal Problem:  *Closed left hip fracture Active Problems:  DIABETES MELLITUS  HYPERLIPIDEMIA-MIXED  DEPRESSIVE DISORDER NOT ELSEWHERE CLASSIFIED  HYPERTENSION, BENIGN  LEFT BUNDLE BRANCH BLOCK  Atrial fibrillation  GERD  Chronic systolic HF (heart failure)  Peripheral neuropathy    Past Medical History  Diagnosis Date  . Cholelithiasis   . Other postprocedural status   . Atrial fibrillation   . Hepatic cyst   . GERD (gastroesophageal reflux disease)   . COPD (chronic obstructive pulmonary disease)   . LBBB (left bundle branch block)   . Diabetes mellitus   . Hyperlipidemia   . Hypertension   . Ischemic cardiomyopathy   . Dizziness     with some gait disability  . Peripheral edema     Past Surgical History  Procedure Date  . Wrist surgery   . Dilation and curettage      Hospital Course See H&P, Labs, Consult and Test reports for all details in brief, patient was admitted for     1. Mech Fall causing Closed left hip fracture - status post Left hip hemiarthroplasty utilizing DePuy component, size 5 standard Tri-Lock stem with a 54 unipolar ball with a +5 adapter by Dr [name] on 05-05-2012, commence PT-OT, weightbearing as tolerated on the left leg per Dr. [name], patient will require continued PT OT at the skilled facility, per Dr. [name] 2 weeks of Lovenox subcutaneous prophylactic dose, he will see the patient in 2 weeks thereafter Lovenox can be discontinued.  WBAT Left lower extremity  DVT prophylaxis - lovenox 40mg  SQ for 2 weeks  total  RTC [name], [facility] [phone], in 2-3 weeks for wound check  Maintain post operative dressing until June 30, then cover daily as needed with gauze  Call [phone] with any questions or concerns.    2.DM-2, diet control continue    CBG (last 3)   Basename 05/07/12 0713 05/06/12 2155 05/06/12 1730  GLUCAP 114* 122* 112*       3. Chronic combined systolic and diastolic CHF last echo done in 2011 shows mildly depressed EF and grade 1 diastolic dysfunction. Patient currently compensated, resume home dose Lasix.     4. H/O Left bundle branch block, hypertension - no acute issues continue home medication Coreg along with Cozaar, monitor blood pressure and adjust medications as needed.    5. History of paroxysmal atrial fibrillation. Continue Coreg at home dose outpatient cardiology followup. Will defer to the cardiologist for long-term anticoagulation.   6. Mild postop anemia due to mild blood loss during surgery along with dilution no need for transfusions will monitor an outpatient followup.    7. Mild reactionary leukocytosis, patient  does not have a cough not short of breath, no fevers, repeat CBC in 3 days time along with BMP for monitoring. Encourage incentive spirometry every hours while awake.      Consults Orthopedics -[place]  Significant Tests:  See full reports for all details     Dg Hip Complete Left  05/04/2012  *RADIOLOGY REPORT*  Clinical Data: Status post fall; obvious left hip deformity, with left hip pain.  LEFT HIP - COMPLETE 2+ VIEW  Comparison: None.  Findings: There is a mildly displaced subcapital fracture involving the left femoral neck, with mild shortening at the fracture site. No additional fractures are seen.  The left femoral head remains seated at the acetabulum.  The patient's right hip hemiarthroplasty is grossly unremarkable in appearance.  The sacroiliac joints are within normal limits.  Mild degenerative change is noted at the  lower lumbar spine.  No significant soft tissue abnormalities are characterized.  IMPRESSION: Mildly displaced subcapital fracture involving the left femoral neck, with mild shortening at the fracture site.  Original Report Authenticated By: Tonia Ghent, M.D.   Dg Pelvis Portable  05/06/2012  *RADIOLOGY REPORT*  Clinical Data: Postop radiograph, status post a 12 hip arthroplasty.  PORTABLE PELVIS  Comparison: Pelvis radiograph performed 09/02/2009  Findings: There has been interval placement of a left hip prosthesis, noted overlying expected alignment, without evidence of loosening or fracture.  Minimal scattered surrounding tiny osseous fragments are noted, with overlying soft tissue air.  The patient's right hip prosthesis is grossly unremarkable in appearance.  The sacroiliac joints are grossly unremarkable.  The visualized bowel gas pattern is within normal limits.  IMPRESSION: Left hip prosthesis noted at expected alignment, without evidence of loosening or fracture.  Original Report Authenticated By: Tonia Ghent, M.D.   Dg Chest Port 1 View  05/04/2012  *RADIOLOGY REPORT*  Clinical Data: Preoperative chest radiograph for left hip fracture. History of diabetes.  PORTABLE CHEST - 1 VIEW  Comparison: Chest radiograph performed 02/25/2012  Findings: The lungs are well-aerated.  Mild vascular congestion is noted.  There is no evidence of focal opacification, pleural effusion or pneumothorax.  The cardiomediastinal silhouette is within normal limits.  No acute osseous abnormalities are seen.  IMPRESSION: Mild vascular congestion noted; lungs remain grossly clear.  No displaced rib fractures seen.  Original Report Authenticated By: Tonia Ghent, M.D.     Today   Subjective:   [name] today has no headache,no chest abdominal pain,no new weakness tingling or numbness, feels much better wants to go home today.    Objective:   Blood pressure 123/66, pulse 71, temperature 98.7 F (37.1  C), temperature source Oral, resp. rate 16, height 5\' 10"  (1.778 m), weight 87.998 kg (194 lb), SpO2 94.00%.  Intake/Output Summary (Last 24 hours) at 05/07/12 0927 Last data filed at 05/07/12 0537  Gross per 24 hour  Intake    600 ml  Output    150 ml  Net    450 ml    Exam Awake Alert, Oriented *3, No new F.N deficits, Normal affect [place].AT,PERRAL Supple Neck,No JVD, No cervical lymphadenopathy appriciated.  Symmetrical Chest wall movement, Good air movement bilaterally, CTAB RRR,No Gallops,Rubs or new Murmurs, No Parasternal Heave +ve B.Sounds, Abd Soft, Non tender, No organomegaly appriciated, No rebound -guarding or rigidity. No Cyanosis, Clubbing or edema, No new Rash or bruise  Data Review      Recent Results (from the past 240 hour(s))  SURGICAL PCR SCREEN     Status: Normal  Collection Time   05/05/12  2:10 PM      Component Value Range Status Comment   MRSA, PCR NEGATIVE  NEGATIVE Final    Staphylococcus aureus NEGATIVE  NEGATIVE Final      CBC w Diff: Lab Results  Component Value Date   WBC 11.4* 05/07/2012   HGB 9.9* 05/07/2012   HCT 28.5* 05/07/2012   PLT 116* 05/07/2012   LYMPHOPCT 14 05/04/2012   MONOPCT 8 05/04/2012   EOSPCT 5 05/04/2012   BASOPCT 0 05/04/2012    CMP: Lab Results  Component Value Date   NA 138 05/06/2012   K 3.8 05/06/2012   CL 106 05/06/2012   CO2 25 05/06/2012   BUN 10 05/06/2012   CREATININE 0.79 05/06/2012   PROT 6.8 05/04/2012   ALBUMIN 3.4* 05/04/2012   BILITOT 0.2* 05/04/2012   ALKPHOS 49 05/04/2012   AST 19 05/04/2012   ALT 18 05/04/2012  .   Discharge Instructions     WBAT Left lower extremity  DVT prophylaxis - lovenox 40mg  SQ for 2 weeks total  RTC [name], [facility] [phone], in 2-3 weeks for wound check  Maintain post operative dressing until June 30, then cover daily as needed with gauze  Call [phone] with any questions or concerns   Follow with Primary MD [name], MD in 7 days   Get CBC, CMP,  checked 3 days by the physician at nursing facility.  Get Medicines reviewed and adjusted.  Please request your Prim.MD to go over all Hospital Tests and Procedure/Radiological results at the follow up, please get all Hospital records sent to your Prim MD by signing hospital release before you go home.  Activity: As tolerated with Full fall precautions use walker/cane & assistance as needed  Diet:  Heart healthy - Low Carb , Aspiration precautions.  For Heart failure patients - Check your Weight same time everyday, if you gain over 2 pounds, or you develop in leg swelling, experience more shortness of breath or chest pain, call your Primary MD immediately. Follow Cardiac Low Salt Diet and 1.8 lit/day fluid restriction.  Disposition SNF  If you experience worsening of your admission symptoms, develop shortness of breath, life threatening emergency, suicidal or homicidal thoughts you must seek medical attention immediately by calling 911 or calling your MD immediately  if symptoms less severe.  You Must read complete instructions/literature along with all the possible adverse reactions/side effects for all the Medicines you take and that have been prescribed to you. Take any new Medicines after you have completely understood and accpet all the possible adverse reactions/side effects.   Do not drive if your were admitted for syncope or siezures until you have seen by Primary MD or a Neurologist and advised to drive.  Do not drive when taking Pain medications.    Do not take more than prescribed Pain, Sleep and Anxiety Medications  Special Instructions: If you have smoked or chewed Tobacco  in the last 2 yrs please stop smoking, stop any regular Alcohol  and or any Recreational drug use.  Wear Seat belts while driving. Follow-up Information    Follow up with SHAW,KIMBERLEE, MD. Schedule an appointment as soon as possible for a visit in 1 week.   Contact information:   [address]  [address] [place] [address] [phone]       Follow up with Shelda Pal, MD. Schedule an appointment as soon as possible for a visit in 2 weeks.   Contact  information:   [facility] [address] [place] [address] [phone]          Discharge Medications    Kalee, [place]  Home Medication Instructions ONG:[id number]   Printed on:05/07/12 4401  Medication Information                    carvedilol (COREG) 25 MG tablet Take 1 tablet (25 mg total) by mouth 2 (two) times daily.           potassium chloride SA (K-DUR,KLOR-CON) 20 MEQ tablet TAKE 1 TABLET DAILY           aspirin 81 MG tablet Take 81 mg by mouth daily.             furosemide (LASIX) 40 MG tablet Take 40 mg by mouth daily.             simvastatin (ZOCOR) 40 MG tablet Take 1 tablet (40 mg total) by mouth at bedtime.           sertraline (ZOLOFT) 50 MG tablet Take 50 mg by mouth daily.           loperamide (IMODIUM A-D) 2 MG tablet Take 2 mg by mouth as needed.           losartan (COZAAR) 25 MG tablet Take 1 tablet (25 mg total) by mouth daily.           magnesium hydroxide (MILK OF MAGNESIA) 400 MG/5ML suspension Take 15 mLs by mouth daily as needed. Constipation           carvedilol (COREG) 25 MG tablet Take 25 mg by mouth 2 (two) times daily with a meal.           HYDROcodone-acetaminophen (NORCO) 5-325 MG per tablet Take 1 tablet by mouth every 4 (four) hours as needed for pain ( ).           enoxaparin (LOVENOX) 40 MG/0.4ML injection Inject 0.4 mLs (40 mg total) into the skin daily.           docusate sodium 100 MG CAPS Take 100 mg by mouth 2 (two) times daily as needed for constipation.              Total Time in preparing paper work, data evaluation and todays exam - 35 minutes  Leroy Sea M.D on 05/07/2012 at 9:27 AM  [facility] Office  [phone]

## 2012-05-07 NOTE — Evaluation (Signed)
Occupational Therapy Evaluation Patient Details Name: [name] MRN: [id number] DOB: Jan 08, 1923 Today's Date: 05/07/2012 Time: 8657-8469 OT Time Calculation (min): 28 min  OT Assessment / Plan / Recommendation Clinical Impression  Pt presents with closed femur fracture s/p L bipolar hip arthroplasty.  Presents today with decreased overall strength, functional mobility and activity tolerance. Will benefit from skilled OT services to improve independence wtih ADL for next venue of care.    OT Assessment  Patient needs continued OT Services    Follow Up Recommendations  Skilled nursing facility    Barriers to Discharge      Equipment Recommendations  Defer to next venue    Recommendations for Other Services    Frequency  Min 1X/week    Precautions / Restrictions Precautions Precautions: Posterior Hip Precaution Booklet Issued: Yes (comment) Restrictions Weight Bearing Restrictions: No LLE Weight Bearing: Weight bearing as tolerated        ADL  Eating/Feeding: Simulated;Independent Where Assessed - Eating/Feeding: Bed level Grooming: Simulated;Wash/dry face;Set up Where Assessed - Grooming: Supine, head of bed up Upper Body Bathing: Performed;Chest;Right arm;Left arm;Abdomen;Minimal assistance Where Assessed - Upper Body Bathing: Supine, head of bed up Lower Body Bathing: Performed;+2 Total assistance Lower Body Bathing: Patient Percentage: 20% Where Assessed - Lower Body Bathing: Supported sit to stand Upper Body Dressing: Simulated;Minimal assistance;Other (comment) (with gown only) Where Assessed - Upper Body Dressing: Supine, head of bed up Lower Body Dressing: Simulated;+2 Total assistance Lower Body Dressing: Patient Percentage: 0% Where Assessed - Lower Body Dressing: Sopported sit to stand Toilet Transfer: Other (comment) (sit to stand only. see notes below) Tub/Shower Transfer Method: Not assessed Equipment Used: Rolling walker ADL Comments:  Attempted to stand X3 trials but pt limited by nausea, lightheadedness and pain. Pt stood on third trial with bed significantly raised up. Unable to transfer to the recliner as pt too weak and nauseous. Will need SNF.    OT Diagnosis: Generalized weakness;Acute pain  OT Problem List: Decreased strength;Decreased activity tolerance;Decreased knowledge of use of DME or AE;Pain OT Treatment Interventions: Self-care/ADL training;Therapeutic activities;DME and/or AE instruction;Patient/family education   OT Goals Acute Rehab OT Goals OT Goal Formulation: With patient Time For Goal Achievement: 05/14/12 Potential to Achieve Goals: Good ADL Goals Pt Will Perform Grooming: with supervision;Unsupported;Sitting, edge of bed ADL Goal: Grooming - Progress: Goal set today Pt Will Perform Upper Body Bathing: with supervision;Sitting, edge of bed;Sitting, chair;Unsupported ADL Goal: Upper Body Bathing - Progress: Goal set today Pt Will Perform Lower Body Bathing: with 2+ total assist;Sit to stand from chair;Sit to stand from bed;Other (comment) (pt 50%) ADL Goal: Lower Body Bathing - Progress: Goal set today Pt Will Transfer to Toilet: with 2+ total assist;with DME;Stand pivot transfer;Maintaining hip precautions;Other (comment) (pt 50%) ADL Goal: Toilet Transfer - Progress: Goal set today Pt Will Perform Toileting - Clothing Manipulation: with 2+ total assist;Other (comment);Standing (pt 50%) ADL Goal: Toileting - Clothing Manipulation - Progress: Goal set today Pt Will Perform Toileting - Hygiene: with +2 total assist;Sit to stand from 3-in-1/toilet;Other (comment) (pt 50%) ADL Goal: Toileting - Hygiene - Progress: Goal set today  Visit Information  Last OT Received On: 05/07/12 Assistance Needed: +2    Subjective Data  Subjective: my pain is only a 2 right now Patient Stated Goal: agreeable to try to get up with PT/OT   Prior Functioning  Home Living Lives With: Alone Type of Home:  Apartment Home Access: Level entry Home Layout: One level Bathroom Shower/Tub: Health visitor: Handicapped  height Home Adaptive Equipment: Grab bars in shower;Walker - rolling;Straight cane Prior Function Level of Independence: Independent Able to Take Stairs?: Yes Driving: No Vocation: Retired Musician: No difficulties    Cognition  Overall Cognitive Status: Appears within functional limits for tasks assessed/performed Arousal/Alertness: Awake/alert Orientation Level: Appears intact for tasks assessed Behavior During Session: [facility] for tasks performed    Extremity/Trunk Assessment Right Upper Extremity Assessment RUE ROM/Strength/Tone: [facility] for tasks assessed Left Upper Extremity Assessment LUE ROM/Strength/Tone: WFL for tasks assessed   Mobility Bed Mobility Supine to Sit: 1: +2 Total assist Supine to Sit: Patient Percentage: 30% Sit to Supine: 1: +2 Total assist Sit to Supine: Patient Percentage: 0% Details for Bed Mobility Assistance: Multimodal cues for safety, technique, hand placement. Increased time. Assist for bil LEs off bed and trunk to upright. Utilized bedpad for scooting, positioning Transfers Sit to Stand: 1: +2 Total assist Sit to Stand: Patient Percentage: 40% Stand to Sit: 1: +2 Total assist Stand to Sit: Patient Percentage: 40% Details for Transfer Assistance: Mulimodal cues for safety, technique, hand placement, posture. x 3 attempts. Pt only able to stand for 10-15 seconds. Fatigues very easily. Unable to take step with R LE to allow for pivot to recliner. Pt c/o dizziness, nausea and requested to return to supine.    Exercise    Balance Static Sitting Balance Static Sitting - Balance Support: Feet supported;Bilateral upper extremity supported Static Sitting - Level of Assistance: 4: Min assist Static Sitting - Comment/# of Minutes: Sat EOB 7-8 minutes. VCs for posture. Assist to stabilize in sitting, especially after  standing activity.   End of Session OT - End of Session Equipment Utilized During Treatment: Gait belt Activity Tolerance: Patient limited by fatigue;Patient limited by pain;Other (comment) (nausea and lightheadedness) Patient left: in bed;with call bell/phone within reach   Lennox Laity [phone] 05/07/2012, 11:45 AM

## 2012-05-07 NOTE — Progress Notes (Signed)
Patient ID: [name], female   DOB: July 10, 1923, 78 y.o.   MRN: [id number] Subjective: 2 Days Post-Op Procedure(s) (LRB): ARTHROPLASTY BIPOLAR HIP (Left)    Patient reports pain as moderate.  Objective:   VITALS:   Filed Vitals:   05/07/12 0537  BP: 123/66  Pulse: 71  Temp: 98.7 F (37.1 C)  Resp: 16    Neurovascular intact Incision: dressing C/D/I  LABS  Basename 05/07/12 0430 05/06/12 0411 05/05/12 0115  HGB 9.9* 11.2* 13.5  HCT 28.5* 33.5* 38.5  WBC 11.4* 9.4 14.5*  PLT 116* 122* 159     Basename 05/06/12 0411 05/05/12 0115 05/04/12 2139  NA 138 139 140  K 3.8 3.6 3.6  BUN 10 13 15   CREATININE 0.79 0.77 0.81  GLUCOSE 120* 146* 129*     Basename 05/04/12 2139  LABPT --  INR 1.00     Assessment/Plan: 2 Days Post-Op Procedure(s) (LRB): ARTHROPLASTY BIPOLAR HIP (Left)   Advance diet Up with therapy Discharge to SNF pending  Orthopaedic plan: WBAT Left lower extremity DVT prophylaxis - lovenox 40mg  SQ for 2 weeks total RTC [name], [facility] [phone], in 2-3 weeks for wound check Maintain post operative dressing until June 30, then cover daily as needed with gauze Call [phone] with any questions or concerns

## 2012-07-22 ENCOUNTER — Telehealth: Payer: Self-pay | Admitting: *Deleted

## 2012-07-22 DIAGNOSIS — I10 Essential (primary) hypertension: Secondary | ICD-10-CM

## 2012-07-22 MED ORDER — LOSARTAN POTASSIUM 50 MG PO TABS: 50.0000 mg | ORAL_TABLET | Freq: Every day | ORAL | Status: DC

## 2012-07-22 NOTE — Telephone Encounter (Signed)
RECEIVED B/P LOG FROM  [facility] PER DR NISHAN PT NEEDS TO INCREASE  LOSARTAN TO 50 MG .PT AWARE .Zack Seal

## 2012-08-05 ENCOUNTER — Other Ambulatory Visit: Payer: Self-pay | Admitting: *Deleted

## 2012-08-05 NOTE — Telephone Encounter (Signed)
Increase cozaar to 100mg  and keep coreg at 12.5 bid

## 2012-08-06 ENCOUNTER — Telehealth: Payer: Self-pay | Admitting: *Deleted

## 2012-08-06 MED ORDER — CARVEDILOL 25 MG PO TABS: ORAL_TABLET | ORAL | Status: DC

## 2012-08-06 MED ORDER — LOSARTAN POTASSIUM 100 MG PO TABS: 100.0000 mg | ORAL_TABLET | Freq: Every day | ORAL | Status: DC

## 2012-08-06 NOTE — Telephone Encounter (Signed)
Comment: Dr. Izora Gala, Pt called for a refill on her Carvedilol 12.5 MG bid. She informed me that her BP is still running high averaging 165/78 in the morning when she gets up, in the afternoons her BP does go down but not much then it starts going back up in the mornings. The highest BP is 181/80 -- 177/82. She was wondering if you wanted her to increase her Carvedilol back to 25 MG bid, I wanted to ask you before I send her Rx into [facility], mail order. Caralee Ates, CMA

## 2012-08-06 NOTE — Telephone Encounter (Signed)
The patient walked in the office this morning due to concerns about her BP readings this morning that were running from 170-200 systolic. She denies having any symptoms, but was concerned about her risk for stroke with readings this elevated. The patient does not know what time she took her medications, but at least the first 2 readings were before her meds, and it sounds like the third with a systolic reading of 200 was about 10 minutes after her medications. I checked the patient's BP here in the office and she was 167/82 with a HR of 68. I reviewed Dr. Fabio Bering recommendations for medication changes from yesterday's refill encounter. I have instructed the patient to increase her cozaar to 100 mg daily and keep all other meds the same. The patient is quite concerned over her BP readings being elevated first thing in the morning. I have informed her that we know her readings will most likely be high prior to her taking her meds. I have advised her to take her morning meds then wait about an hour before she checks her BP. I have instructed her should her readings still be high after she takes her medications, then that is what we need to know about. She verbalizes understanding. I will send in refills for her to [facility] for Coreg 12.5 mg BID and Losartan 100 mg once daily per the patient's request. She has been instructed to call back should her readings remain elevated on her meds.

## 2012-08-06 NOTE — Telephone Encounter (Signed)
[name], MD 08/05/2012 11:21 PM Signed  Increase cozaar to 100mg  and keep coreg at 12.5 bid

## 2012-08-10 ENCOUNTER — Telehealth: Payer: Self-pay | Admitting: Cardiovascular Disease

## 2012-08-10 NOTE — Telephone Encounter (Signed)
Pt's  Pharmacy needs clarification of increase on losartin @ [facility]

## 2012-08-11 ENCOUNTER — Other Ambulatory Visit: Payer: Self-pay

## 2012-08-11 MED ORDER — LOSARTAN POTASSIUM 100 MG PO TABS: 100.0000 mg | ORAL_TABLET | Freq: Every day | ORAL | Status: DC

## 2012-08-13 ENCOUNTER — Other Ambulatory Visit: Payer: Self-pay | Admitting: *Deleted

## 2012-08-13 MED ORDER — LOSARTAN POTASSIUM 100 MG PO TABS: 100.0000 mg | ORAL_TABLET | Freq: Every day | ORAL | Status: DC

## 2012-08-26 ENCOUNTER — Other Ambulatory Visit (HOSPITAL_COMMUNITY): Payer: Self-pay | Admitting: Family Medicine

## 2012-08-26 DIAGNOSIS — J449 Chronic obstructive pulmonary disease, unspecified: Secondary | ICD-10-CM

## 2012-08-31 ENCOUNTER — Other Ambulatory Visit (HOSPITAL_COMMUNITY): Payer: Self-pay | Admitting: Radiology

## 2012-08-31 ENCOUNTER — Ambulatory Visit (HOSPITAL_COMMUNITY)
Admission: RE | Admit: 2012-08-31 | Discharge: 2012-08-31 | Disposition: A | Discharge status: Home / Self Care | Payer: Medicare HMO | Source: Ambulatory Visit | Attending: Family Medicine | Admitting: Family Medicine

## 2012-08-31 DIAGNOSIS — J449 Chronic obstructive pulmonary disease, unspecified: Secondary | ICD-10-CM | POA: Insufficient documentation

## 2012-08-31 DIAGNOSIS — J4489 Other specified chronic obstructive pulmonary disease: Secondary | ICD-10-CM | POA: Insufficient documentation

## 2012-08-31 MED ORDER — ALBUTEROL SULFATE (5 MG/ML) 0.5% IN NEBU
2.5000 mg | INHALATION_SOLUTION | Freq: Once | RESPIRATORY_TRACT | Status: AC
  Administered 2012-08-31: 2.5 mg via RESPIRATORY_TRACT

## 2012-09-19 ENCOUNTER — Encounter (HOSPITAL_COMMUNITY): Payer: Self-pay | Admitting: *Deleted

## 2012-09-19 ENCOUNTER — Emergency Department (HOSPITAL_COMMUNITY)
Admission: EM | Admit: 2012-09-19 | Discharge: 2012-09-19 | Disposition: A | Discharge status: Home / Self Care | Payer: Medicare PPO | Attending: Emergency Medicine | Admitting: Emergency Medicine

## 2012-09-19 DIAGNOSIS — I2589 Other forms of chronic ischemic heart disease: Secondary | ICD-10-CM | POA: Insufficient documentation

## 2012-09-19 DIAGNOSIS — Z7982 Long term (current) use of aspirin: Secondary | ICD-10-CM | POA: Insufficient documentation

## 2012-09-19 DIAGNOSIS — E785 Hyperlipidemia, unspecified: Secondary | ICD-10-CM | POA: Insufficient documentation

## 2012-09-19 DIAGNOSIS — E119 Type 2 diabetes mellitus without complications: Secondary | ICD-10-CM | POA: Insufficient documentation

## 2012-09-19 DIAGNOSIS — J449 Chronic obstructive pulmonary disease, unspecified: Secondary | ICD-10-CM | POA: Insufficient documentation

## 2012-09-19 DIAGNOSIS — Z8679 Personal history of other diseases of the circulatory system: Secondary | ICD-10-CM | POA: Insufficient documentation

## 2012-09-19 DIAGNOSIS — I509 Heart failure, unspecified: Secondary | ICD-10-CM | POA: Insufficient documentation

## 2012-09-19 DIAGNOSIS — Z87892 Personal history of anaphylaxis: Secondary | ICD-10-CM | POA: Insufficient documentation

## 2012-09-19 DIAGNOSIS — Z7901 Long term (current) use of anticoagulants: Secondary | ICD-10-CM | POA: Insufficient documentation

## 2012-09-19 DIAGNOSIS — I10 Essential (primary) hypertension: Secondary | ICD-10-CM | POA: Insufficient documentation

## 2012-09-19 DIAGNOSIS — Z79899 Other long term (current) drug therapy: Secondary | ICD-10-CM | POA: Insufficient documentation

## 2012-09-19 DIAGNOSIS — J4489 Other specified chronic obstructive pulmonary disease: Secondary | ICD-10-CM | POA: Insufficient documentation

## 2012-09-19 DIAGNOSIS — K219 Gastro-esophageal reflux disease without esophagitis: Secondary | ICD-10-CM | POA: Insufficient documentation

## 2012-09-19 DIAGNOSIS — Z8719 Personal history of other diseases of the digestive system: Secondary | ICD-10-CM | POA: Insufficient documentation

## 2012-09-19 LAB — CBC WITH DIFFERENTIAL/PLATELET
Basophils Absolute: 0 10*3/uL (ref 0.0–0.1)
Eosinophils Absolute: 0.5 10*3/uL (ref 0.0–0.7)
Eosinophils Relative: 7 % — ABNORMAL HIGH (ref 0–5)
MCH: 32 pg (ref 26.0–34.0)
MCHC: 36.1 g/dL — ABNORMAL HIGH (ref 30.0–36.0)
MCV: 88.5 fL (ref 78.0–100.0)
Platelets: 160 10*3/uL (ref 150–400)
RDW: 13 % (ref 11.5–15.5)

## 2012-09-19 LAB — COMPREHENSIVE METABOLIC PANEL
ALT: 14 U/L (ref 0–35)
Calcium: 9.6 mg/dL (ref 8.4–10.5)
GFR calc Af Amer: 71 mL/min — ABNORMAL LOW (ref >90)
Glucose, Bld: 112 mg/dL — ABNORMAL HIGH (ref 70–99)
Sodium: 141 mEq/L (ref 135–145)
Total Protein: 7.5 g/dL (ref 6.0–8.3)

## 2012-09-19 NOTE — ED Notes (Signed)
EDP at bedside  

## 2012-09-19 NOTE — ED Notes (Signed)
Pt disconnected from monitor and waiting on discharge paper work.

## 2012-09-19 NOTE — ED Notes (Signed)
Rx x 0.  Pt voiced understanding to f/u with PCP and return for worsening condition.  

## 2012-09-19 NOTE — ED Notes (Signed)
Pt states she has been having "problems with blood pressure" for the past 3 weeks.  Has been to see Dr. Eden Emms who increased her losartan 3 weeks ago.  Pt states he BP gets higher at night and has been doing so for weeks. Has record of BP readings with her today.  Denies pain, dizzy, nausea.

## 2012-09-19 NOTE — ED Provider Notes (Signed)
History     CSN: [id number]  Arrival date & time 09/19/12  1933   First MD Initiated Contact with Patient 09/19/12 2103      Chief Complaint  Patient presents with  . Hypertension     Patient is a 78 y.o. female presenting with hypertension. The history is provided by the patient.  Hypertension This is a recurrent problem. The current episode started more than 1 week ago. The problem occurs constantly. The problem has not changed since onset.Pertinent negatives include no chest pain, no abdominal pain, no headaches and no shortness of breath. Nothing aggravates the symptoms. Nothing relieves the symptoms. She has tried rest (extra lostartn) for the symptoms. The treatment provided mild relief.  pt reports elevated BP for several weeks She  reports recent increase in her meds but still high BP She has no cp/sob.  No weakness. No HA.  No focal weakness or dizziness.  No new visual changes reported   Past Medical History  Diagnosis Date  . Cholelithiasis   . Other postprocedural status   . Atrial fibrillation   . Hepatic cyst   . GERD (gastroesophageal reflux disease)   . COPD (chronic obstructive pulmonary disease)   . LBBB (left bundle branch block)   . Diabetes mellitus   . Hyperlipidemia   . Hypertension   . Ischemic cardiomyopathy   . Dizziness     with some gait disability  . Peripheral edema     Past Surgical History  Procedure Date  . Wrist surgery   . Dilation and curettage   . Hip arthroplasty 05/05/2012    Procedure: ARTHROPLASTY BIPOLAR HIP;  Surgeon: Shelda Pal, MD;  Location: [facility];  Service: Orthopedics;  Laterality: Left;    Family History  Problem Relation Age of Onset  . Heart failure Brother     died age 64    History  Substance Use Topics  . Smoking status: Never Smoker   . Smokeless tobacco: Not on file  . Alcohol Use: No    OB History    Grav Para Term Preterm Abortions TAB SAB Ect Mult Living                  Review of Systems   Respiratory: Negative for shortness of breath.   Cardiovascular: Negative for chest pain.  Gastrointestinal: Negative for abdominal pain.  Neurological: Negative for headaches.  All other systems reviewed and are negative.    Allergies  Penicillins; Sulfonamide derivatives; and Tetanus toxoid  Home Medications   Current Outpatient Rx  Name Route Sig Dispense Refill  . ASPIRIN 81 MG PO TABS Oral Take 81 mg by mouth daily.      Marland Kitchen CARVEDILOL 25 MG PO TABS  Take 1/2 tablet by mouth twice daily 45 tablet 3  . ENOXAPARIN SODIUM 40 MG/0.4ML [place] SOLN Subcutaneous Inject 0.4 mLs (40 mg total) into the skin daily. 14 Syringe 0  . FUROSEMIDE 40 MG PO TABS Oral Take 40 mg by mouth daily.      Marland Kitchen LOPERAMIDE HCL 2 MG PO TABS Oral Take 2 mg by mouth as needed.    Marland Kitchen LOSARTAN POTASSIUM 100 MG PO TABS Oral Take 1 tablet (100 mg total) by mouth daily. 90 tablet 3    Pt dose was increased ok to fill  . MAGNESIUM HYDROXIDE 400 MG/5ML PO SUSP Oral Take 15 mLs by mouth daily as needed. Constipation    . POTASSIUM CHLORIDE CRYS ER 20 MEQ PO TBCR  TAKE 1 TABLET DAILY 90 tablet 3  . SERTRALINE HCL 50 MG PO TABS Oral Take 50 mg by mouth daily.    Marland Kitchen SIMVASTATIN 40 MG PO TABS Oral Take 1 tablet (40 mg total) by mouth at bedtime. 90 tablet 2    BP 162/91  Pulse 66  Temp 98.4 F (36.9 C) (Oral)  Resp 18  SpO2 98%  Physical Exam CONSTITUTIONAL: Well developed/well nourished HEAD AND FACE: Normocephalic/atraumatic EYES: EOMI/PERRL ENMT: Mucous membranes moist NECK: supple no meningeal signs SPINE:entire spine nontender CV: S1/S2 noted, no murmurs/rubs/gallops noted LUNGS: Lungs are clear to auscultation bilaterally, no apparent distress ABDOMEN: soft, nontender, no rebound or guarding GU:no cva tenderness NEURO: Pt is awake/alert, moves all extremitiesx4 EXTREMITIES: pulses normal, full ROM SKIN: warm, color normal PSYCH: no abnormalities of mood noted  ED Course  Procedures   Labs Reviewed    CBC WITH DIFFERENTIAL - Abnormal; Notable for the following:    MCHC 36.1 (*)     Eosinophils Relative 7 (*)     All other components within normal limits  COMPREHENSIVE METABOLIC PANEL - Abnormal; Notable for the following:    Glucose, Bld 112 (*)     GFR calc non Af Amer 62 (*)     GFR calc Af Amer 71 (*)     All other components within normal limits  TROPONIN I  9:40 PM  Pt well appearing, no distress, resting comfortably.  She does have h/o CHF but is not SOB, no CP/dizziness Advised need for close f/u with PCP and cardiologist.  Given she is asymptomatic, I feel it would be much safer for this to be lowered slowly with outpatient meds.  No indication for acute lowering at this time.  Her BP has started to lower spontaneously here.  Doubt ACS/acute CHF/CVA at this time  1. HTN (hypertension)       MDM  Nursing notes including past medical history and social history reviewed and considered in documentation Labs/vital reviewed and considered Previous records reviewed and considered - h/o CHF        Date: 09/19/2012  Rate: 69  Rhythm: normal sinus rhythm  QRS Axis: left  Intervals: PR prolonged  ST/T Wave abnormalities: nonspecific ST changes  Conduction Disutrbances:left bundle branch block  Narrative Interpretation:   Old EKG Reviewed: unchanged    Joya Gaskins, MD 09/19/12 2140

## 2012-09-19 NOTE — ED Notes (Signed)
The pt has had elevated bp for one week.  She has seen her doctor and had her bp meds changed.  She has been taking her bp  All day and it has been elevated.  No pain anywhere

## 2012-10-08 ENCOUNTER — Ambulatory Visit (INDEPENDENT_AMBULATORY_CARE_PROVIDER_SITE_OTHER): Payer: Medicare HMO | Admitting: Cardiovascular Disease

## 2012-10-08 ENCOUNTER — Encounter: Payer: Self-pay | Admitting: Cardiovascular Disease

## 2012-10-08 VITALS — BP 146/80 | HR 73 | Ht 70.0 in | Wt 202.0 lb

## 2012-10-08 DIAGNOSIS — I5022 Chronic systolic (congestive) heart failure: Secondary | ICD-10-CM

## 2012-10-08 DIAGNOSIS — E785 Hyperlipidemia, unspecified: Secondary | ICD-10-CM

## 2012-10-08 DIAGNOSIS — I447 Left bundle-branch block, unspecified: Secondary | ICD-10-CM

## 2012-10-08 DIAGNOSIS — I10 Essential (primary) hypertension: Secondary | ICD-10-CM

## 2012-10-08 MED ORDER — AMLODIPINE BESYLATE 10 MG PO TABS: 10.0000 mg | ORAL_TABLET | Freq: Every day | ORAL | Status: DC

## 2012-10-08 NOTE — Patient Instructions (Addendum)
Start taking Amlodipine 10 mg daily at lunch  Follow-up with Dr. Eden Emms in 3 months

## 2012-10-08 NOTE — Assessment & Plan Note (Signed)
Euvolemic with no PND or orthopnea  Continue current meds

## 2012-10-08 NOTE — Assessment & Plan Note (Signed)
Cholesterol is at goal.  Continue current dose of statin and diet Rx.  No myalgias or side effects.  F/U  LFT's in 6 months. Lab Results  Component Value Date   LDLCALC  Value: 64        Total Cholesterol/HDL:CHD Risk Coronary Heart Disease Risk Table                     Men   Women  1/2 Average Risk   3.4   3.3  Average Risk       5.0   4.4  2 X Average Risk   9.6   7.1  3 X Average Risk  23.4   11.0        Use the calculated Patient Ratio above and the CHD Risk Table to determine the patient's CHD Risk.        ATP III CLASSIFICATION (LDL):  <100     mg/dL   Optimal  100-129  mg/dL   Near or Above                    Optimal  130-159  mg/dL   Borderline  160-189  mg/dL   High  >190     mg/dL   Very High 03/17/2009             

## 2012-10-08 NOTE — Assessment & Plan Note (Signed)
Continue diuretic, ARB and beta blocker at norvasc at noon  F/U 6 weeks

## 2012-10-08 NOTE — Progress Notes (Signed)
Patient ID: [name], female   DOB: 01-Jan-1923, 78 y.o.   MRN: [id number] [name] is seen today for F/U of PAF, LBBB, DCM with EF 20% no CAD by cath in 2009 S/P right hip surgery from fall 2011 She has had some continued right hip pain and is taking hydrocodone for it. Her BP has been better on home readings. Average systolics have been 12-140 mmHg after increasing her Benzipril to 40mg  she has not had any SSCP, palpitations, or syncope. She has vertiginous symptoms with change in position She also has stable mild LE edema.  Reviewed home BP records and they are up  Left hip still bothering her.  Surgery [name] in March  Rehab was at [facility]  Using 3 pt cane today. F/U Dr Jillyn Hidden I encouraged her to get An Xray to make sure it is stable.  Long discussion with [name] She needs to be in assisted living. She is lonely and unstable on her feet. She no longer drives due to macular degeneration.   Not taking NSAI' s and watches salt.  ROS: Denies fever, malais, weight loss, blurry vision, decreased visual acuity, cough, sputum, SOB, hemoptysis, pleuritic pain, palpitaitons, heartburn, abdominal pain, melena, lower extremity edema, claudication, or rash.  All other systems reviewed and negative  General: Affect appropriate Healthy:  appears stated age HEENT: normal Neck supple with no adenopathy JVP normal no bruits no thyromegaly Lungs clear with no wheezing and good diaphragmatic motion Heart:  S1/S2 no murmur, no rub, gallop or click PMI normal Abdomen: benighn, BS positve, no tenderness, no AAA no bruit.  No HSM or HJR Distal pulses intact with no bruits No edema Neuro non-focal Skin warm and dry No muscular weakness   Current Outpatient Prescriptions  Medication Sig Dispense Refill  . alendronate (FOSAMAX) 70 MG tablet Take 70 mg by mouth every 7 (seven) days. Take with a full glass of water on an empty stomach.      Marland Kitchen aspirin 81 MG chewable tablet Chew 81 mg by mouth  daily.      . carvedilol (COREG) 25 MG tablet Take 12.5 mg by mouth 2 (two) times daily with a meal.      . furosemide (LASIX) 40 MG tablet Take 40 mg by mouth daily.        Marland Kitchen loperamide (IMODIUM A-D) 2 MG tablet Take 2 mg by mouth as needed. For loose stool      . losartan (COZAAR) 50 MG tablet Take 50 mg by mouth 2 (two) times daily.      . magnesium hydroxide (MILK OF MAGNESIA) 400 MG/5ML suspension Take 15 mLs by mouth daily as needed. Constipation      . sertraline (ZOLOFT) 50 MG tablet Take 50 mg by mouth daily.      . simvastatin (ZOCOR) 40 MG tablet Take 1 tablet (40 mg total) by mouth at bedtime.  90 tablet  2  . amLODipine (NORVASC) 10 MG tablet Take 1 tablet (10 mg total) by mouth daily with lunch.  180 tablet  3    Allergies  Penicillins; Sulfonamide derivatives; and Tetanus toxoid  Electrocardiogram:  09/22/12  LBBB no acute changes  Assessment and Plan

## 2012-10-08 NOTE — Assessment & Plan Note (Signed)
Stable no AV block no syncope

## 2012-12-10 ENCOUNTER — Telehealth: Payer: Self-pay | Admitting: Cardiovascular Disease

## 2012-12-10 MED ORDER — LOSARTAN POTASSIUM 50 MG PO TABS: 50.0000 mg | ORAL_TABLET | Freq: Two times a day (BID) | ORAL | Status: DC

## 2012-12-10 MED ORDER — SIMVASTATIN 40 MG PO TABS: 40.0000 mg | ORAL_TABLET | Freq: Every day | ORAL | Status: DC

## 2012-12-10 NOTE — Telephone Encounter (Signed)
PT NEEDS LORSARTIN 50MG  1 TAB TWO TIMES A DAY, SIMVASTATIIN 40MG  1 TAB A BEDTIME, RIGHTSOUCE 90 DAY SUPPLY

## 2013-01-12 ENCOUNTER — Ambulatory Visit (INDEPENDENT_AMBULATORY_CARE_PROVIDER_SITE_OTHER): Payer: Medicare PPO | Admitting: Cardiovascular Disease

## 2013-01-12 ENCOUNTER — Encounter: Payer: Self-pay | Admitting: Cardiovascular Disease

## 2013-01-12 VITALS — BP 136/74 | HR 74 | Ht 70.5 in | Wt 213.0 lb

## 2013-01-12 DIAGNOSIS — I5022 Chronic systolic (congestive) heart failure: Secondary | ICD-10-CM

## 2013-01-12 DIAGNOSIS — I4891 Unspecified atrial fibrillation: Secondary | ICD-10-CM

## 2013-01-12 DIAGNOSIS — I447 Left bundle-branch block, unspecified: Secondary | ICD-10-CM

## 2013-01-12 DIAGNOSIS — M129 Arthropathy, unspecified: Secondary | ICD-10-CM

## 2013-01-12 DIAGNOSIS — M1990 Unspecified osteoarthritis, unspecified site: Secondary | ICD-10-CM

## 2013-01-12 DIAGNOSIS — I10 Essential (primary) hypertension: Secondary | ICD-10-CM

## 2013-01-12 DIAGNOSIS — E785 Hyperlipidemia, unspecified: Secondary | ICD-10-CM

## 2013-01-12 NOTE — Assessment & Plan Note (Signed)
SSS with PAF maint NSR no anticoagulation with age and difficulty walking

## 2013-01-12 NOTE — Patient Instructions (Signed)
Your physician wants you to follow-up in:  6 MONTHS WITH DR NISHAN  You will receive a reminder letter in the mail two months in advance. If you don't receive a letter, please call our office to schedule the follow-up appointment. Your physician recommends that you continue on your current medications as directed. Please refer to the Current Medication list given to you today. 

## 2013-01-12 NOTE — Assessment & Plan Note (Signed)
Cholesterol is at goal.  Continue current dose of statin and diet Rx.  No myalgias or side effects.  F/U  LFT's in 6 months. Lab Results  Component Value Date   LDLCALC  Value: 64        Total Cholesterol/HDL:CHD Risk Coronary Heart Disease Risk Table                     Men   Women  1/2 Average Risk   3.4   3.3  Average Risk       5.0   4.4  2 X Average Risk   9.6   7.1  3 X Average Risk  23.4   11.0        Use the calculated Patient Ratio above and the CHD Risk Table to determine the patient's CHD Risk.        ATP III CLASSIFICATION (LDL):  <100     mg/dL   Optimal  782-956  mg/dL   Near or Above                    Optimal  130-159  mg/dL   Borderline  213-086  mg/dL   High  >578     mg/dL   Very High 4/69/6295

## 2013-01-12 NOTE — Assessment & Plan Note (Signed)
Edema in LLE from venous disease and orthopedic issues with hip.  She takes and extra lasix as needed. Activity limited by arthritis not dyspnea  Stable

## 2013-01-12 NOTE — Assessment & Plan Note (Signed)
Stable no AV block yearly ECG

## 2013-01-12 NOTE — Assessment & Plan Note (Signed)
Improved with norvasc  Continue low sodium diet and current meds

## 2013-01-12 NOTE — Progress Notes (Signed)
Patient ID: [name], female   DOB: 07-Mar-1923, 79 y.o.   MRN: [id number] Renay is seen today for F/U of PAF, LBBB, DCM with EF 20% no CAD by cath in 2009 S/P right hip surgery from fall 2011 She has had some continued right hip pain and is taking hydrocodone for it. Her BP has been better on home readings. Average systolics have been 12-140 mmHg after increasing her Benzipril to 40mg  she has not had any SSCP, palpitations, or syncope. She has vertiginous symptoms with change in position She also has stable mild LE edema.  Reviewed home BP records and they are up  Norvasc added last visit and reviewed home readings which are improved  Left hip still bothering her. Surgery [name] in March Rehab was at [facility] Using 3 pt cane today. F/U Dr Jillyn Hidden I encouraged her to get An Xray to make sure it is stable.  Long discussion with Maritsa She needs to be in assisted living. She is lonely and unstable on her feet. She no longer drives due to macular degeneration.   Not taking NSAI' s and watches salt.   ROS: Denies fever, malais, weight loss, blurry vision, decreased visual acuity, cough, sputum, SOB, hemoptysis, pleuritic pain, palpitaitons, heartburn, abdominal pain, melena, lower extremity edema, claudication, or rash.  All other systems reviewed and negative  General: Affect appropriate Healthy:  appears stated age HEENT: normal Neck supple with no adenopathy JVP normal no bruits no thyromegaly Lungs clear with no wheezing and good diaphragmatic motion Heart:  S1/S2 no murmur, no rub, gallop or click PMI normal Abdomen: benighn, BS positve, no tenderness, no AAA no bruit.  No HSM or HJR Distal pulses intact with no bruits Plus 1-2 LLE edema Neuro non-focal Skin warm and dry No muscular weakness   Current Outpatient Prescriptions  Medication Sig Dispense Refill  . amLODipine (NORVASC) 10 MG tablet Take 1 tablet (10 mg total) by mouth daily with lunch.  180 tablet  3  .  aspirin 81 MG chewable tablet Chew 81 mg by mouth daily.      . carvedilol (COREG) 25 MG tablet Take 12.5 mg by mouth 2 (two) times daily with a meal.      . diphenhydrAMINE (BENADRYL) 25 MG tablet Take 25 mg by mouth at bedtime.      . furosemide (LASIX) 40 MG tablet Take 40 mg by mouth daily.        Marland Kitchen losartan (COZAAR) 50 MG tablet Take 1 tablet (50 mg total) by mouth 2 (two) times daily.  180 tablet  2  . magnesium hydroxide (MILK OF MAGNESIA) 400 MG/5ML suspension Take 15 mLs by mouth daily as needed. Constipation      . omeprazole (PRILOSEC) 20 MG capsule Take 20 mg by mouth daily.      . Ranibizumab (LUCENTIS IO) Inject into the eye. Eye injection into right eye every six weeks      . simvastatin (ZOCOR) 40 MG tablet Take 1 tablet (40 mg total) by mouth at bedtime.  90 tablet  2   No current facility-administered medications for this visit.    Allergies  Fosamax; Penicillins; Sulfonamide derivatives; and Tetanus toxoid  Electrocardiogram:  11/23/11  SR rate 69 LBBB PR 226 LAD  Assessment and Plan

## 2013-01-12 NOTE — Assessment & Plan Note (Signed)
Encouraged her to f/u with Dr [name] and get xray of left hip which is still painful

## 2013-02-02 ENCOUNTER — Other Ambulatory Visit: Payer: Self-pay | Admitting: *Deleted

## 2013-02-02 MED ORDER — CARVEDILOL 12.5 MG PO TABS: 12.5000 mg | ORAL_TABLET | Freq: Two times a day (BID) | ORAL | Status: DC

## 2013-02-02 NOTE — Telephone Encounter (Signed)
Patient calling about Carvedilol 25mg , one half tablet twice daily. States she has problems cutting her tablets and prefers 12.5 twice daily sent to [facility].  Carve 12.5mg , twice daily by mouth, #180, 2 R.F   Micki Riley, CMA

## 2013-03-12 ENCOUNTER — Encounter: Payer: Self-pay | Admitting: Cardiovascular Disease

## 2013-08-02 ENCOUNTER — Ambulatory Visit (INDEPENDENT_AMBULATORY_CARE_PROVIDER_SITE_OTHER): Payer: Medicare PPO | Admitting: Cardiovascular Disease

## 2013-08-02 ENCOUNTER — Encounter: Payer: Self-pay | Admitting: Cardiovascular Disease

## 2013-08-02 VITALS — BP 128/70 | HR 80 | Ht 70.5 in | Wt 210.0 lb

## 2013-08-02 DIAGNOSIS — E119 Type 2 diabetes mellitus without complications: Secondary | ICD-10-CM

## 2013-08-02 DIAGNOSIS — I10 Essential (primary) hypertension: Secondary | ICD-10-CM

## 2013-08-02 DIAGNOSIS — I5022 Chronic systolic (congestive) heart failure: Secondary | ICD-10-CM

## 2013-08-02 DIAGNOSIS — E785 Hyperlipidemia, unspecified: Secondary | ICD-10-CM

## 2013-08-02 DIAGNOSIS — I447 Left bundle-branch block, unspecified: Secondary | ICD-10-CM

## 2013-08-02 NOTE — Assessment & Plan Note (Signed)
Well controlled.  Continue current medications and low sodium Dash type diet.    

## 2013-08-02 NOTE — Assessment & Plan Note (Signed)
Discussed low carb diet.  Target hemoglobin A1c is 6.5 or less.  Continue current medications.  

## 2013-08-02 NOTE — Assessment & Plan Note (Signed)
Euvolemic with no CHF  Continue current meds

## 2013-08-02 NOTE — Patient Instructions (Signed)
Your physician wants you to follow-up in:  6 MONTHS WITH DR NISHAN  You will receive a reminder letter in the mail two months in advance. If you don't receive a letter, please call our office to schedule the follow-up appointment. Your physician recommends that you continue on your current medications as directed. Please refer to the Current Medication list given to you today. 

## 2013-08-02 NOTE — Assessment & Plan Note (Signed)
Cholesterol is at goal.  Continue current dose of statin and diet Rx.  No myalgias or side effects.  F/U  LFT's in 6 months. Lab Results  Component Value Date   LDLCALC  Value: 64        Total Cholesterol/HDL:CHD Risk Coronary Heart Disease Risk Table                     Men   Women  1/2 Average Risk   3.4   3.3  Average Risk       5.0   4.4  2 X Average Risk   9.6   7.1  3 X Average Risk  23.4   11.0        Use the calculated Patient Ratio above and the CHD Risk Table to determine the patient's CHD Risk.        ATP III CLASSIFICATION (LDL):  <100     mg/dL   Optimal  161-096  mg/dL   Near or Above                    Optimal  130-159  mg/dL   Borderline  045-409  mg/dL   High  >811     mg/dL   Very High 08/01/7828

## 2013-08-02 NOTE — Progress Notes (Signed)
Patient ID: [name], female   DOB: Oct 14, 1923, 79 y.o.   MRN: [id number] [name] is seen today for F/U of PAF, LBBB, DCM with EF 20% no CAD by cath in 2009 S/P right hip surgery from fall 2011 She has had some continued right hip pain and is taking hydrocodone for it. Her BP has been better on home readings. Average systolics have been 12-140 mmHg after increasing her Benzipril to 40mg  she has not had any SSCP, palpitations, or syncope. She has vertiginous symptoms with change in position She also has stable mild LE edema.   BP is fine Hips better and still living at home.  Using walker Recent sleeping pill given by Dr Clelia Croft  ROS: Denies fever, malais, weight loss, blurry vision, decreased visual acuity, cough, sputum, SOB, hemoptysis, pleuritic pain, palpitaitons, heartburn, abdominal pain, melena, lower extremity edema, claudication, or rash.  All other systems reviewed and negative  General: Affect appropriate Healthy:  appears stated age HEENT: normal Neck supple with no adenopathy JVP normal no bruits no thyromegaly Lungs clear with no wheezing and good diaphragmatic motion Heart:  S1/S2 no murmur, no rub, gallop or click PMI normal Abdomen: benighn, BS positve, no tenderness, no AAA no bruit.  No HSM or HJR Distal pulses intact with no bruits No edema Neuro non-focal Skin warm and dry LE weakness and difficulty standing   Current Outpatient Prescriptions  Medication Sig Dispense Refill  . acetaminophen (TYLENOL) 500 MG tablet Take 500 mg by mouth every 6 (six) hours as needed for pain.      Marland Kitchen aspirin 81 MG chewable tablet Chew 81 mg by mouth daily.      . carvedilol (COREG) 12.5 MG tablet Take 1 tablet (12.5 mg total) by mouth 2 (two) times daily with a meal.  180 tablet  2  . furosemide (LASIX) 40 MG tablet Take 40 mg by mouth daily.        Marland Kitchen losartan (COZAAR) 50 MG tablet Take 1 tablet (50 mg total) by mouth 2 (two) times daily.  180 tablet  2  . magnesium  hydroxide (MILK OF MAGNESIA) 400 MG/5ML suspension Take 15 mLs by mouth daily as needed. Constipation      . mirtazapine (REMERON) 15 MG tablet Take 15-30 mg by mouth at bedtime.      . Multiple Vitamins-Minerals (CENTRUM SILVER PO) Take by mouth.      Marland Kitchen omeprazole (PRILOSEC) 20 MG capsule Take 20 mg by mouth daily.      . Ranibizumab (LUCENTIS IO) Inject into the eye. Eye injection into right eye every six weeks      . simvastatin (ZOCOR) 40 MG tablet Take 1 tablet (40 mg total) by mouth at bedtime.  90 tablet  2   No current facility-administered medications for this visit.    Allergies  Fosamax; Penicillins; Sulfonamide derivatives; and Tetanus toxoid  Electrocardiogram:  11/5  SR rate 89 LBBB   Assessment and Plan

## 2013-08-02 NOTE — Assessment & Plan Note (Signed)
Stable no high grade AV block 

## 2013-08-24 ENCOUNTER — Other Ambulatory Visit: Payer: Self-pay

## 2013-08-24 MED ORDER — SIMVASTATIN 40 MG PO TABS: 40.0000 mg | ORAL_TABLET | Freq: Every day | ORAL | Status: DC

## 2013-09-14 ENCOUNTER — Other Ambulatory Visit: Payer: Self-pay

## 2013-09-14 MED ORDER — LOSARTAN POTASSIUM 50 MG PO TABS: 50.0000 mg | ORAL_TABLET | Freq: Two times a day (BID) | ORAL | Status: DC

## 2013-11-02 ENCOUNTER — Other Ambulatory Visit: Payer: Self-pay

## 2013-11-02 MED ORDER — CARVEDILOL 12.5 MG PO TABS: 12.5000 mg | ORAL_TABLET | Freq: Two times a day (BID) | ORAL | Status: DC

## 2013-11-07 ENCOUNTER — Encounter (HOSPITAL_COMMUNITY): Payer: Self-pay | Admitting: Emergency Medicine

## 2013-11-07 ENCOUNTER — Inpatient Hospital Stay (HOSPITAL_COMMUNITY)
Admission: EM | Admit: 2013-11-07 | Discharge: 2013-11-10 | DRG: 291 | Disposition: A | Discharge status: Skilled Nursing Facility | Payer: Medicare HMO | Attending: Internal Medicine | Admitting: Internal Medicine

## 2013-11-07 ENCOUNTER — Emergency Department (HOSPITAL_COMMUNITY): Payer: Medicare HMO

## 2013-11-07 DIAGNOSIS — Z79899 Other long term (current) drug therapy: Secondary | ICD-10-CM

## 2013-11-07 DIAGNOSIS — J111 Influenza due to unidentified influenza virus with other respiratory manifestations: Secondary | ICD-10-CM | POA: Diagnosis present

## 2013-11-07 DIAGNOSIS — Z806 Family history of leukemia: Secondary | ICD-10-CM

## 2013-11-07 DIAGNOSIS — R509 Fever, unspecified: Secondary | ICD-10-CM

## 2013-11-07 DIAGNOSIS — E785 Hyperlipidemia, unspecified: Secondary | ICD-10-CM | POA: Diagnosis present

## 2013-11-07 DIAGNOSIS — Z8249 Family history of ischemic heart disease and other diseases of the circulatory system: Secondary | ICD-10-CM

## 2013-11-07 DIAGNOSIS — I447 Left bundle-branch block, unspecified: Secondary | ICD-10-CM | POA: Diagnosis present

## 2013-11-07 DIAGNOSIS — J9601 Acute respiratory failure with hypoxia: Secondary | ICD-10-CM

## 2013-11-07 DIAGNOSIS — R531 Weakness: Secondary | ICD-10-CM

## 2013-11-07 DIAGNOSIS — R5381 Other malaise: Secondary | ICD-10-CM

## 2013-11-07 DIAGNOSIS — R0902 Hypoxemia: Secondary | ICD-10-CM

## 2013-11-07 DIAGNOSIS — I4891 Unspecified atrial fibrillation: Secondary | ICD-10-CM | POA: Diagnosis present

## 2013-11-07 DIAGNOSIS — I5043 Acute on chronic combined systolic (congestive) and diastolic (congestive) heart failure: Principal | ICD-10-CM | POA: Diagnosis present

## 2013-11-07 DIAGNOSIS — I5022 Chronic systolic (congestive) heart failure: Secondary | ICD-10-CM

## 2013-11-07 DIAGNOSIS — A419 Sepsis, unspecified organism: Secondary | ICD-10-CM | POA: Diagnosis present

## 2013-11-07 DIAGNOSIS — I509 Heart failure, unspecified: Secondary | ICD-10-CM

## 2013-11-07 DIAGNOSIS — I2589 Other forms of chronic ischemic heart disease: Secondary | ICD-10-CM | POA: Diagnosis present

## 2013-11-07 DIAGNOSIS — K219 Gastro-esophageal reflux disease without esophagitis: Secondary | ICD-10-CM | POA: Diagnosis present

## 2013-11-07 DIAGNOSIS — E119 Type 2 diabetes mellitus without complications: Secondary | ICD-10-CM | POA: Diagnosis present

## 2013-11-07 DIAGNOSIS — R000 Tachycardia, unspecified: Secondary | ICD-10-CM | POA: Diagnosis present

## 2013-11-07 DIAGNOSIS — Z7982 Long term (current) use of aspirin: Secondary | ICD-10-CM

## 2013-11-07 DIAGNOSIS — J9600 Acute respiratory failure, unspecified whether with hypoxia or hypercapnia: Secondary | ICD-10-CM | POA: Diagnosis present

## 2013-11-07 DIAGNOSIS — I10 Essential (primary) hypertension: Secondary | ICD-10-CM | POA: Diagnosis present

## 2013-11-07 DIAGNOSIS — J40 Bronchitis, not specified as acute or chronic: Secondary | ICD-10-CM | POA: Diagnosis present

## 2013-11-07 DIAGNOSIS — E1140 Type 2 diabetes mellitus with diabetic neuropathy, unspecified: Secondary | ICD-10-CM | POA: Diagnosis present

## 2013-11-07 DIAGNOSIS — Z880 Allergy status to penicillin: Secondary | ICD-10-CM

## 2013-11-07 DIAGNOSIS — H3530 Unspecified macular degeneration: Secondary | ICD-10-CM | POA: Diagnosis present

## 2013-11-07 DIAGNOSIS — N179 Acute kidney failure, unspecified: Secondary | ICD-10-CM

## 2013-11-07 HISTORY — DX: Unspecified macular degeneration: H35.30

## 2013-11-07 HISTORY — DX: Constipation, unspecified: K59.00

## 2013-11-07 LAB — COMPREHENSIVE METABOLIC PANEL
Alkaline Phosphatase: 66 U/L (ref 39–117)
BUN: 14 mg/dL (ref 6–23)
CO2: 26 mEq/L (ref 19–32)
Chloride: 99 mEq/L (ref 96–112)
GFR calc Af Amer: 53 mL/min — ABNORMAL LOW (ref >90)
GFR calc non Af Amer: 45 mL/min — ABNORMAL LOW (ref >90)
Glucose, Bld: 128 mg/dL — ABNORMAL HIGH (ref 70–99)
Potassium: 3.7 mEq/L (ref 3.5–5.1)
Total Bilirubin: 0.5 mg/dL (ref 0.3–1.2)

## 2013-11-07 LAB — INFLUENZA PANEL BY PCR (TYPE A & B): H1N1 flu by pcr: NOT DETECTED

## 2013-11-07 LAB — CBC WITH DIFFERENTIAL/PLATELET
Eosinophils Absolute: 0 10*3/uL (ref 0.0–0.7)
Hemoglobin: 13.1 g/dL (ref 12.0–15.0)
Lymphocytes Relative: 11 % — ABNORMAL LOW (ref 12–46)
Lymphs Abs: 0.7 10*3/uL (ref 0.7–4.0)
MCH: 31.9 pg (ref 26.0–34.0)
Monocytes Relative: 12 % (ref 3–12)
Neutro Abs: 4.7 10*3/uL (ref 1.7–7.7)
Neutrophils Relative %: 76 % (ref 43–77)
RBC: 4.11 MIL/uL (ref 3.87–5.11)

## 2013-11-07 LAB — URINALYSIS, ROUTINE W REFLEX MICROSCOPIC
Bilirubin Urine: NEGATIVE
Ketones, ur: NEGATIVE mg/dL
Leukocytes, UA: NEGATIVE
Nitrite: NEGATIVE
Protein, ur: NEGATIVE mg/dL

## 2013-11-07 LAB — PRO B NATRIURETIC PEPTIDE: Pro B Natriuretic peptide (BNP): 880.1 pg/mL — ABNORMAL HIGH (ref 0–450)

## 2013-11-07 LAB — URINE MICROSCOPIC-ADD ON

## 2013-11-07 LAB — GLUCOSE, CAPILLARY
Glucose-Capillary: 90 mg/dL (ref 70–99)
Glucose-Capillary: 103 mg/dL — ABNORMAL HIGH (ref 70–99)

## 2013-11-07 LAB — POCT I-STAT TROPONIN I: Troponin i, poc: 0.02 ng/mL (ref 0.00–0.08)

## 2013-11-07 MED ORDER — MUPIROCIN CALCIUM 2 % EX CREA
TOPICAL_CREAM | Freq: Two times a day (BID) | CUTANEOUS | Status: DC
  Filled 2013-11-07: qty 15

## 2013-11-07 MED ORDER — OSELTAMIVIR PHOSPHATE 30 MG PO CAPS
30.0000 mg | ORAL_CAPSULE | Freq: Two times a day (BID) | ORAL | Status: DC
  Administered 2013-11-07 – 2013-11-10 (×6): 30 mg via ORAL
  Filled 2013-11-07 (×7): qty 1

## 2013-11-07 MED ORDER — SODIUM CHLORIDE 0.9 % IV BOLUS (SEPSIS): 500.0000 mL | Freq: Once | INTRAVENOUS | Status: DC

## 2013-11-07 MED ORDER — ONDANSETRON HCL 4 MG PO TABS: 4.0000 mg | ORAL_TABLET | Freq: Four times a day (QID) | ORAL | Status: DC | PRN

## 2013-11-07 MED ORDER — SODIUM CHLORIDE 0.9 % IJ SOLN
3.0000 mL | Freq: Two times a day (BID) | INTRAMUSCULAR | Status: DC
  Administered 2013-11-08 – 2013-11-10 (×5): 3 mL via INTRAVENOUS

## 2013-11-07 MED ORDER — SENNA 8.6 MG PO TABS
1.0000 | ORAL_TABLET | Freq: Two times a day (BID) | ORAL | Status: DC
  Administered 2013-11-07 – 2013-11-09 (×3): 8.6 mg via ORAL
  Filled 2013-11-07 (×5): qty 1

## 2013-11-07 MED ORDER — CARVEDILOL 12.5 MG PO TABS
12.5000 mg | ORAL_TABLET | Freq: Two times a day (BID) | ORAL | Status: DC
  Administered 2013-11-07 – 2013-11-10 (×6): 12.5 mg via ORAL
  Filled 2013-11-07 (×8): qty 1

## 2013-11-07 MED ORDER — ONDANSETRON HCL 4 MG/2ML IJ SOLN: 4.0000 mg | Freq: Three times a day (TID) | INTRAMUSCULAR | Status: DC | PRN

## 2013-11-07 MED ORDER — GUAIFENESIN-DM 100-10 MG/5ML PO SYRP: 10.0000 mL | ORAL_SOLUTION | ORAL | Status: DC | PRN

## 2013-11-07 MED ORDER — ONDANSETRON HCL 4 MG/2ML IJ SOLN
4.0000 mg | Freq: Once | INTRAMUSCULAR | Status: AC
  Administered 2013-11-07: 4 mg via INTRAVENOUS
  Filled 2013-11-07: qty 2

## 2013-11-07 MED ORDER — ONDANSETRON HCL 4 MG/2ML IJ SOLN: 4.0000 mg | Freq: Four times a day (QID) | INTRAMUSCULAR | Status: DC | PRN

## 2013-11-07 MED ORDER — LEVOFLOXACIN IN D5W 750 MG/150ML IV SOLN
750.0000 mg | INTRAVENOUS | Status: DC
  Filled 2013-11-07: qty 150

## 2013-11-07 MED ORDER — BENZONATATE 100 MG PO CAPS
100.0000 mg | ORAL_CAPSULE | Freq: Two times a day (BID) | ORAL | Status: DC
  Administered 2013-11-07 – 2013-11-10 (×6): 100 mg via ORAL
  Filled 2013-11-07 (×7): qty 1

## 2013-11-07 MED ORDER — MUPIROCIN 2 % EX OINT
TOPICAL_OINTMENT | Freq: Two times a day (BID) | CUTANEOUS | Status: AC
  Administered 2013-11-07: 23:00:00 via TOPICAL
  Administered 2013-11-08: 1 via TOPICAL
  Administered 2013-11-08 – 2013-11-10 (×4): via TOPICAL
  Filled 2013-11-07: qty 22

## 2013-11-07 MED ORDER — FUROSEMIDE 40 MG PO TABS
40.0000 mg | ORAL_TABLET | Freq: Every day | ORAL | Status: DC
  Administered 2013-11-07 – 2013-11-08 (×2): 40 mg via ORAL
  Filled 2013-11-07 (×2): qty 1

## 2013-11-07 MED ORDER — FAMOTIDINE 20 MG PO TABS
20.0000 mg | ORAL_TABLET | Freq: Every day | ORAL | Status: DC
  Administered 2013-11-07 – 2013-11-09 (×4): 20 mg via ORAL
  Filled 2013-11-07 (×5): qty 1

## 2013-11-07 MED ORDER — LOSARTAN POTASSIUM 50 MG PO TABS
50.0000 mg | ORAL_TABLET | Freq: Two times a day (BID) | ORAL | Status: DC
  Administered 2013-11-07 – 2013-11-09 (×4): 50 mg via ORAL
  Filled 2013-11-07 (×5): qty 1

## 2013-11-07 MED ORDER — ACETAMINOPHEN 650 MG RE SUPP: 650.0000 mg | Freq: Four times a day (QID) | RECTAL | Status: DC | PRN

## 2013-11-07 MED ORDER — SIMVASTATIN 40 MG PO TABS
40.0000 mg | ORAL_TABLET | Freq: Every day | ORAL | Status: DC
  Administered 2013-11-07 – 2013-11-09 (×3): 40 mg via ORAL
  Filled 2013-11-07 (×4): qty 1

## 2013-11-07 MED ORDER — FUROSEMIDE 10 MG/ML IJ SOLN
20.0000 mg | Freq: Once | INTRAMUSCULAR | Status: AC
  Administered 2013-11-07: 20 mg via INTRAVENOUS
  Filled 2013-11-07: qty 4

## 2013-11-07 MED ORDER — INSULIN ASPART 100 UNIT/ML ~~LOC~~ SOLN
0.0000 [IU] | Freq: Three times a day (TID) | SUBCUTANEOUS | Status: DC
  Administered 2013-11-09: 12:00:00 1 [IU] via SUBCUTANEOUS

## 2013-11-07 MED ORDER — ASPIRIN 81 MG PO CHEW
81.0000 mg | CHEWABLE_TABLET | Freq: Every day | ORAL | Status: DC
  Administered 2013-11-07 – 2013-11-10 (×4): 81 mg via ORAL
  Filled 2013-11-07 (×4): qty 1

## 2013-11-07 MED ORDER — BISACODYL 10 MG RE SUPP: 10.0000 mg | Freq: Every day | RECTAL | Status: DC | PRN

## 2013-11-07 MED ORDER — POLYETHYLENE GLYCOL 3350 17 G PO PACK
17.0000 g | PACK | Freq: Every day | ORAL | Status: DC
  Administered 2013-11-07 – 2013-11-10 (×3): 17 g via ORAL
  Filled 2013-11-07 (×4): qty 1

## 2013-11-07 MED ORDER — ENOXAPARIN SODIUM 40 MG/0.4ML ~~LOC~~ SOLN
40.0000 mg | SUBCUTANEOUS | Status: DC
  Administered 2013-11-07 – 2013-11-09 (×3): 40 mg via SUBCUTANEOUS
  Filled 2013-11-07 (×4): qty 0.4

## 2013-11-07 MED ORDER — HYDROCODONE-ACETAMINOPHEN 5-325 MG PO TABS
1.0000 | ORAL_TABLET | ORAL | Status: DC | PRN
  Administered 2013-11-08 (×2): 1 via ORAL
  Filled 2013-11-07 (×2): qty 1

## 2013-11-07 MED ORDER — ACETAMINOPHEN 325 MG PO TABS: 650.0000 mg | ORAL_TABLET | Freq: Four times a day (QID) | ORAL | Status: DC | PRN

## 2013-11-07 NOTE — H&P (Addendum)
[facility] History and Physical  [name] RUE:[id number] DOB: 28-Mar-1923 DOA: 11/07/2013  Referring physician:  Nelva Nay PCP:  [name], MD   Chief Complaint:  SOB  HPI:  The patient is a 79 y.o. year-old female with history of ischemic cardiomyopathy with EF of 15-20%, paroxysmal atrial fibrillation, LBBB, T2DM, HTN, HLD who presents with fevers, cough, vomiting, sore throat x 5 days.  The patient was last at their baseline health approximately one week ago.  She lives at home alone and ambulates with a rolling walker.  She states that about 5 days ago she developed a cough productive of clear phlegm. Her cough has gotten progressively worse. She has had associated fevers to 102 Fahrenheit, nausea, heaves, sore throat, sinus congestion with rhinorrhea. She has been getting progressively weaker over the last several days. 2 days ago she stopped taking her Lasix because she said that she was not eating or drinking very much. Today, she slid out of bed and had to crawl on the floor to the front door in order to get help.  EMS was called to transport her to the emergency department. She with mildly hypoxic to the mid 80s and required nasal cannula.  In the emergency department, her temperature was 100.2 Fahrenheit, pulse 102. Her oxygen levels decreased to the mid-80s on room air. Her labs were notable for white blood cell count of 6.2, platelets 122. Troponin negative, ProBNP 880. Electrolytes were within normal limits. Her chest x-ray demonstrated Diffuse peribronchial cuffing suggestive of bronchitis, in no acute consolidative airspace disease. She was given 500 mL of IV fluids and a dose of Zofran. After her IV fluids, she had increasing wheezing. Although many years ago she was told she may have COPD she has subsequently had pulmonary function tests which were completely normal. She does not have asthma or COPD. I have ordered a dose of Lasix 20 mg IV once to be given in  the emergency department.   Review of Systems:  General:  Positive  fevers, chills, weight loss or gain HEENT:   positive rhinorrhea, sinus congestion, sore throat CV:  Denies chest pain and palpitations, lower extremity edema.  PULM:   per history of present illness    GI:   chronic constipation, no recent diarrhea GU:  Denies dysuria, frequency, urgency ENDO:  Denies polyuria, polydipsia.   HEME:  Denies hematemesis, blood in stools, melena, abnormal bruising or bleeding.  LYMPH:  Denies lymphadenopathy.   MSK:  Positive myalgias,  chronic arthralgias  DERM:  Denies skin rash or ulcer.   NEURO:  Denies focal numbness, weakness, slurred speech, confusion, facial droop.  PSYCH:  Denies anxiety and depression.    Past Medical History  Diagnosis Date  . Cholelithiasis   . Other postprocedural status(V45.89)   . Atrial fibrillation   . Hepatic cyst   . GERD (gastroesophageal reflux disease)   . LBBB (left bundle branch block)   . Diabetes mellitus   . Hyperlipidemia   . Hypertension   . Ischemic cardiomyopathy   . Dizziness     with some gait disability  . Peripheral edema   . Constipation   . Macular degeneration    Past Surgical History  Procedure Laterality Date  . Wrist surgery Bilateral     wrist fractures  . Dilation and curettage    . Hip arthroplasty  05/05/2012    Procedure: ARTHROPLASTY BIPOLAR HIP;  Surgeon: Shelda Pal, MD;  Location: [facility];  Service: Orthopedics;  Laterality:  Left;  . Cholecystectomy    . Ankle surgery Bilateral     fractures  . Tonsillectomy     Social History:  reports that she has been passively smoking.  She does not have any smokeless tobacco history on file. She reports that she does not drink alcohol or use illicit drugs. Lives alone, uses a walker.  Does not drive   Allergies  Allergen Reactions  . Fosamax [Alendronate Sodium]   . Penicillins Swelling  . Sulfonamide Derivatives Swelling  . Tetanus Toxoid Swelling     Family History  Problem Relation Age of Onset  . Heart failure Brother     died age 9  . Leukemia Mother   . Arthritis Father   . Arthritis Brother      Prior to Admission medications   Medication Sig Start Date End Date Taking? Authorizing Provider  acetaminophen (TYLENOL) 500 MG tablet Take 500 mg by mouth every 6 (six) hours as needed for pain.   Yes Historical Provider, MD  aspirin 81 MG chewable tablet Chew 81 mg by mouth daily.   Yes Historical Provider, MD  beta carotene w/minerals (OCUVITE) tablet Take 1 tablet by mouth 2 (two) times daily.   Yes Historical Provider, MD  carvedilol (COREG) 12.5 MG tablet Take 1 tablet (12.5 mg total) by mouth 2 (two) times daily with a meal. 11/02/13  Yes Wendall Stade, MD  famotidine (PEPCID) 20 MG tablet Take 20 mg by mouth at bedtime.   Yes Historical Provider, MD  furosemide (LASIX) 40 MG tablet Take 40 mg by mouth daily.     Yes Historical Provider, MD  losartan (COZAAR) 50 MG tablet Take 1 tablet (50 mg total) by mouth 2 (two) times daily. 09/14/13  Yes Wendall Stade, MD  Ranibizumab (LUCENTIS IO) Inject into the eye. Eye injection into right eye every six weeks   Yes Historical Provider, MD  simvastatin (ZOCOR) 40 MG tablet Take 1 tablet (40 mg total) by mouth at bedtime. 08/24/13  Yes Wendall Stade, MD   Physical Exam: Filed Vitals:   11/07/13 1331 11/07/13 1552  BP: 123/96 123/99  Pulse: 102   Temp: 100.2 F (37.9 C) 100.2 F (37.9 C)  TempSrc: Oral Oral  Resp: 16 18  SpO2: 94% 99%     General:  Until Caucasian female, in no acute distress   Eyes:  PERRL, anicteric, non-injected.  ENT:  Nares injected with clear rhinorrhea, OP with erythema of the posterior soft palate and tonsils.  MMM.  Neck:  Supple without TM or JVD.    Lymph:  No cervical, supraclavicular, or submandibular LAD.  Cardiovascular:  Distant heart sounds, regular rhythm, no obvious murmurs, rubs, gallops.  2+ pulses, warm  extremities  Respiratory:   Full expiratory wheeze, without focal rhonchi or rales, no increased WOB, prolonged expiratory phase  Abdomen:  NABS.  Soft, ND/NT.    Skin:  No rashes or focal lesions.  Musculoskeletal:  Normal bulk and tone.  No LE edema.  Psychiatric:  A & O x 4.  Appropriate affect.  Neurologic:  CN 3-12 intact.  4/5 strength throughout.  Sensation intact.  Labs on Admission:  Basic Metabolic Panel:  Recent Labs Lab 11/07/13 1344  NA 136  K 3.7  CL 99  CO2 26  GLUCOSE 128*  BUN 14  CREATININE 1.06  CALCIUM 9.1   Liver Function Tests:  Recent Labs Lab 11/07/13 1344  AST 26  ALT 19  ALKPHOS 66  BILITOT  0.5  PROT 7.2  ALBUMIN 3.4*   No results found for this basename: LIPASE, AMYLASE,  in the last 168 hours No results found for this basename: AMMONIA,  in the last 168 hours CBC:  Recent Labs Lab 11/07/13 1344  WBC 6.2  NEUTROABS 4.7  HGB 13.1  HCT 37.3  MCV 90.8  PLT 122*   Cardiac Enzymes: No results found for this basename: CKTOTAL, CKMB, CKMBINDEX, TROPONINI,  in the last 168 hours  BNP (last 3 results)  Recent Labs  11/07/13 1412  PROBNP 880.1*   CBG: No results found for this basename: GLUCAP,  in the last 168 hours  Radiological Exams on Admission: Dg Chest 2 View  11/07/2013   CLINICAL DATA:  Cough and fever.  Weakness and fatigue.  EXAM: CHEST  2 VIEW  COMPARISON:  Chest x-ray 05/04/2012.  FINDINGS: Lung volumes are slightly low. Mild diffuse peribronchial cuffing. No definite acute consolidative airspace disease. No pleural effusions. No evidence of pulmonary edema. Heart size is normal. Mediastinal contours are unremarkable. Atherosclerosis in the thoracic aorta.  IMPRESSION: 1. Mild diffuse peribronchial cuffing may suggest a bronchitis. 2. Atherosclerosis.   Electronically Signed   By: Trudie Reed M.D.   On: 11/07/2013 14:36    EKG: Pending  Assessment/Plan Principal Problem:   Acute on chronic combined  systolic and diastolic congestive heart failure Active Problems:   DIABETES MELLITUS   HYPERLIPIDEMIA-MIXED   HYPERTENSION, BENIGN   Atrial fibrillation   GERD   Influenza   Acute respiratory failure with hypoxia   Fever   Generalized weakness  ---  Generalized weakness is likely secondary to viral infection such as influenza. No focal deficits suggestive of stroke. -  PT and OT evaluations -  May need rehabilitation at discharge  Acute hypoxic respiratory failure likely secondary to bronchitis and acute On chronic systolic and diastolic heart failure -  Nasal cannula to keep oxygen saturations greater than 92% -  Wean oxygen as tolerated  Sepsis (tachycardia, fever)  with bronchitis on CXR, possible pneumonia as source, possible flu -  Influenza PCR -  Start Tamiflu -  No evidence of pneumonia on chest x-ray -  Urinalysis pending -  Blood cultures x2 -  Start levofloxacin and d/c if flu test positive  Acute on chronic systolic and diastolic heart failure, ejection fraction of 15-20%, proBNP 880, and markedly elevated compared to prior -  Telemetry -  Daily weights and strict ins and outs -  Lasix 20 mg IV once now to see how she tolerates -  If her wheezing and hypoxia start to improve, suggest increasing dose to 40 mg IV once daily -  Continue beta blocker, ARB, aspirin  -  Followed by cardiology, Doctor Eden Emms  Hypertension/hyperlipidemia, blood pressure stable. Continue beta blocker, ARB, statin   Paroxysmal atrial fibrillation, CHADS2vasc score high, however, patient deemed high falls risk therefore not on A/C -  Continue ASA and BB  GERD, stable, continue famotidine  T2DM, not on medications -  A1c -  SSI  Diet:  Diabetic 2gm, 1.2L fluid restriction Access:  PIV IVF:  Saline lock.   Proph:  lovenox  Code Status: Full Family Communication: patient and her neice Disposition Plan: Admit to telemetry  Time spent: 60 min Renae Fickle [facility] Pager [phone]  If 7PM-7AM, please contact night-coverage [email or web address] Password [facility] 11/07/2013, 4:30 PM

## 2013-11-07 NOTE — ED Provider Notes (Signed)
CSN: [id number]     Arrival date & time 11/07/13  1326 History   First MD Initiated Contact with Patient 11/07/13 1340     Chief Complaint  Patient presents with  . Nausea  . Fatigue    HPI Pt complains of nausea and weakness. Pt has had episodes of dry heaving. EMS was called out due to a fall. Reports patient slipped and fell and scooted to front door has skin tear to left elbow. Denies neck or back pain. No LOC. Denies hitting head.   Past Medical History  Diagnosis Date  . Cholelithiasis   . Other postprocedural status(V45.89)   . Atrial fibrillation   . Hepatic cyst   . GERD (gastroesophageal reflux disease)   . COPD (chronic obstructive pulmonary disease)   . LBBB (left bundle branch block)   . Diabetes mellitus   . Hyperlipidemia   . Hypertension   . Ischemic cardiomyopathy   . Dizziness     with some gait disability  . Peripheral edema    Past Surgical History  Procedure Laterality Date  . Wrist surgery    . Dilation and curettage    . Hip arthroplasty  05/05/2012    Procedure: ARTHROPLASTY BIPOLAR HIP;  Surgeon: Shelda Pal, MD;  Location: [facility];  Service: Orthopedics;  Laterality: Left;  . Cholecystectomy     Family History  Problem Relation Age of Onset  . Heart failure Brother     died age 24   History  Substance Use Topics  . Smoking status: Never Smoker   . Smokeless tobacco: Not on file  . Alcohol Use: No   OB History   Grav Para Term Preterm Abortions TAB SAB Ect Mult Living                 Review of Systems  Constitutional: Positive for fever and chills.  Respiratory: Positive for cough.     Allergies  Fosamax; Penicillins; Sulfonamide derivatives; and Tetanus toxoid  Home Medications   Current Outpatient Rx  Name  Route  Sig  Dispense  Refill  . acetaminophen (TYLENOL) 500 MG tablet   Oral   Take 500 mg by mouth every 6 (six) hours as needed for pain.         Marland Kitchen aspirin 81 MG chewable tablet   Oral   Chew 81 mg by mouth  daily.         . beta carotene w/minerals (OCUVITE) tablet   Oral   Take 1 tablet by mouth 2 (two) times daily.         . carvedilol (COREG) 12.5 MG tablet   Oral   Take 1 tablet (12.5 mg total) by mouth 2 (two) times daily with a meal.   180 tablet   2   . famotidine (PEPCID) 20 MG tablet   Oral   Take 20 mg by mouth at bedtime.         . furosemide (LASIX) 40 MG tablet   Oral   Take 40 mg by mouth daily.           Marland Kitchen losartan (COZAAR) 50 MG tablet   Oral   Take 1 tablet (50 mg total) by mouth 2 (two) times daily.   180 tablet   2   . Ranibizumab (LUCENTIS IO)   Intraocular   Inject into the eye. Eye injection into right eye every six weeks         . simvastatin (ZOCOR) 40 MG tablet  Oral   Take 1 tablet (40 mg total) by mouth at bedtime.   90 tablet   2    BP 123/96  Pulse 102  Temp(Src) 100.2 F (37.9 C) (Oral)  Resp 16  SpO2 94% Physical Exam  Nursing note and vitals reviewed. Constitutional: She is oriented to person, place, and time. She appears well-developed and well-nourished. No distress.  HENT:  Head: Normocephalic and atraumatic.  Eyes: Pupils are equal, round, and reactive to light.  Neck: Normal range of motion.  Cardiovascular: Normal rate and intact distal pulses.   Pulmonary/Chest: No respiratory distress. She has wheezes (Mild expiratory).  Abdominal: Normal appearance. She exhibits no distension. There is no tenderness. There is no rebound.  Musculoskeletal: Normal range of motion.  Neurological: She is alert and oriented to person, place, and time. No cranial nerve deficit.  Skin: Skin is warm and dry. No rash noted.  Psychiatric: She has a normal mood and affect. Her behavior is normal.    ED Course  Procedures (including critical care time) Labs Review Labs Reviewed  CBC WITH DIFFERENTIAL - Abnormal; Notable for the following:    Platelets 122 (*)    Lymphocytes Relative 11 (*)    All other components within normal  limits  COMPREHENSIVE METABOLIC PANEL - Abnormal; Notable for the following:    Glucose, Bld 128 (*)    Albumin 3.4 (*)    GFR calc non Af Amer 45 (*)    GFR calc Af Amer 53 (*)    All other components within normal limits  PRO B NATRIURETIC PEPTIDE - Abnormal; Notable for the following:    Pro B Natriuretic peptide (BNP) 880.1 (*)    All other components within normal limits  URINE CULTURE  URINALYSIS, ROUTINE W REFLEX MICROSCOPIC  INFLUENZA PANEL BY PCR  POCT I-STAT TROPONIN I   Imaging Review Dg Chest 2 View  11/07/2013   CLINICAL DATA:  Cough and fever.  Weakness and fatigue.  EXAM: CHEST  2 VIEW  COMPARISON:  Chest x-ray 05/04/2012.  FINDINGS: Lung volumes are slightly low. Mild diffuse peribronchial cuffing. No definite acute consolidative airspace disease. No pleural effusions. No evidence of pulmonary edema. Heart size is normal. Mediastinal contours are unremarkable. Atherosclerosis in the thoracic aorta.  IMPRESSION: 1. Mild diffuse peribronchial cuffing may suggest a bronchitis. 2. Atherosclerosis.   Electronically Signed   By: Trudie Reed M.D.   On: 11/07/2013 14:36    EKG Interpretation   None       MDM   1. Influenza   2. Hypoxia        Nelia Shi, MD 11/07/13 [phone]

## 2013-11-07 NOTE — ED Notes (Signed)
Pt was given 4mg  of Zofran enroute by EMS

## 2013-11-07 NOTE — ED Notes (Signed)
Pt does not wear oxygen at home. Pt sats dropped to90-91% placed patient on 2L [place] and patient oxygen sats rised to 98%

## 2013-11-07 NOTE — ED Notes (Signed)
Pt complains of nausea and weakness.  Pt has had episodes of dry heaving. EMS was called out due to a fall.  Reports patient slipped and fell and scooted to front door has skin tear to left elbow. Denies neck or back pain.  No LOC.  Denies hitting head.

## 2013-11-07 NOTE — ED Notes (Signed)
Pt reports cough for awhile with fever yesterday.  Pt reports feeling very weak and fatigue.  Pt reports slipped off the bed and couldn't get up.  Pt has small carpet burn to right elbow

## 2013-11-07 NOTE — ED Notes (Signed)
Bed: ZO10 Expected date: 11/07/13 Expected time: 1:24 PM Means of arrival:  Comments: N/V Zofran given

## 2013-11-08 DIAGNOSIS — R509 Fever, unspecified: Secondary | ICD-10-CM

## 2013-11-08 DIAGNOSIS — I5022 Chronic systolic (congestive) heart failure: Secondary | ICD-10-CM

## 2013-11-08 LAB — BASIC METABOLIC PANEL
BUN: 16 mg/dL (ref 6–23)
CO2: 22 mEq/L (ref 19–32)
Chloride: 98 mEq/L (ref 96–112)
Creatinine, Ser: 1.19 mg/dL — ABNORMAL HIGH (ref 0.50–1.10)
Glucose, Bld: 93 mg/dL (ref 70–99)
Potassium: 3.2 mEq/L — ABNORMAL LOW (ref 3.5–5.1)

## 2013-11-08 LAB — GLUCOSE, CAPILLARY
Glucose-Capillary: 92 mg/dL (ref 70–99)
Glucose-Capillary: 106 mg/dL — ABNORMAL HIGH (ref 70–99)
Glucose-Capillary: 94 mg/dL (ref 70–99)
Glucose-Capillary: 93 mg/dL (ref 70–99)

## 2013-11-08 LAB — CBC
HCT: 34.5 % — ABNORMAL LOW (ref 36.0–46.0)
Hemoglobin: 11.9 g/dL — ABNORMAL LOW (ref 12.0–15.0)
MCH: 31.5 pg (ref 26.0–34.0)
MCHC: 34.5 g/dL (ref 30.0–36.0)
MCV: 91.3 fL (ref 78.0–100.0)
RDW: 13.4 % (ref 11.5–15.5)

## 2013-11-08 LAB — URINE CULTURE

## 2013-11-08 MED ORDER — ALBUTEROL SULFATE (5 MG/ML) 0.5% IN NEBU
2.5000 mg | INHALATION_SOLUTION | Freq: Three times a day (TID) | RESPIRATORY_TRACT | Status: DC
  Administered 2013-11-08 – 2013-11-10 (×6): 2.5 mg via RESPIRATORY_TRACT
  Filled 2013-11-08 (×6): qty 0.5

## 2013-11-08 MED ORDER — FUROSEMIDE 10 MG/ML IJ SOLN: 40.0000 mg | Freq: Every day | INTRAMUSCULAR | Status: DC

## 2013-11-08 MED ORDER — POTASSIUM CHLORIDE CRYS ER 20 MEQ PO TBCR
40.0000 meq | EXTENDED_RELEASE_TABLET | Freq: Once | ORAL | Status: AC
  Administered 2013-11-08: 09:00:00 40 meq via ORAL
  Filled 2013-11-08: qty 2

## 2013-11-08 MED ORDER — ENSURE COMPLETE PO LIQD
237.0000 mL | ORAL | Status: DC
  Administered 2013-11-08 – 2013-11-09 (×2): 237 mL via ORAL

## 2013-11-08 MED ORDER — IPRATROPIUM BROMIDE 0.02 % IN SOLN
0.5000 mg | Freq: Three times a day (TID) | RESPIRATORY_TRACT | Status: DC
  Administered 2013-11-08 – 2013-11-10 (×6): 0.5 mg via RESPIRATORY_TRACT
  Filled 2013-11-08 (×6): qty 2.5

## 2013-11-08 MED ORDER — FUROSEMIDE 40 MG PO TABS
40.0000 mg | ORAL_TABLET | Freq: Every day | ORAL | Status: DC
  Filled 2013-11-08: qty 1

## 2013-11-08 NOTE — Evaluation (Signed)
Physical Therapy Evaluation Patient Details Name: [name] MRN: [id number] DOB: [birth year] Today's Date: 11/08/2013 Time: 9562-1308 PT Time Calculation (min): 26 min  PT Assessment / Plan / Recommendation History of Present Illness  The patient is a 79 y.o. year-old female with history of ischemic cardiomyopathy with EF of 15-20%, paroxysmal atrial fibrillation, LBBB, T2DM, HTN, HLD who presents with fevers, cough, vomiting, sore throat x 5 days.  The patient was last at their baseline health approximately one week ago.  She lives at home alone and ambulates with a rolling walker.  She states that about 5 days ago she developed a cough productive of clear phlegm. Her cough has gotten progressively worse. She has had associated fevers to 102 Fahrenheit, nausea, heaves, sore throat, sinus congestion with rhinorrhea. She has been getting progressively weaker over the last several days. 2 days ago she stopped taking her Lasix because she said that she was not eating or drinking very much. Today, she slid out of bed and had to crawl on the floor to the front door in order to get help.  EMS was called to transport her to the emergency department. She with mildly hypoxic to the mid 80s and required nasal cannula.  Clinical Impression  On eval, pt required Max assist for mobility-only able to stand for ~5-10 seconds with walker at EOB before becoming, shaky, dizzy, and bil LEs buckled. Recommend SNF for continued rehab    PT Assessment  Patient needs continued PT services    Follow Up Recommendations  SNF    Does the patient have the potential to tolerate intense rehabilitation      Barriers to Discharge        Equipment Recommendations  None recommended by PT    Recommendations for Other Services OT consult   Frequency Min 3X/week    Precautions / Restrictions Precautions Precautions: Fall Precaution Comments: watch oxygen saturation Restrictions Weight Bearing Restrictions:  No   Pertinent Vitals/Pain No c/o pain      Mobility  Bed Mobility Bed Mobility: Supine to Sit;Sit to Supine Supine to Sit: 3: Mod assist Sit to Supine: 3: Mod assist Details for Bed Mobility Assistance: Increased time. Assist for trunk and LEs Transfers Transfers: Sit to Stand;Stand to Sit Sit to Stand: 2: Max assist;From bed Stand to Sit: 2: Max assist;To bed Details for Transfer Assistance: x2 attempts. Pt only able to stand for ~5-10 seconds before becoming very shaky, dizzy, and bil LEs buckling. Assist to rise, stabilize, control descent.  Ambulation/Gait Ambulation/Gait Assistance Details: Unable to safely attempt    Exercises General Exercises - Lower Extremity Long Arc Quad: AROM;Both;10 reps;Seated Hip Flexion/Marching: AROM;Seated   PT Diagnosis: Difficulty walking;Abnormality of gait;Generalized weakness  PT Problem List: Decreased strength;Decreased activity tolerance;Decreased balance;Decreased mobility;Decreased coordination;Decreased knowledge of use of DME PT Treatment Interventions: DME instruction;Gait training;Functional mobility training;Therapeutic activities;Therapeutic exercise;Balance training;Patient/family education     PT Goals(Current goals can be found in the care plan section) Acute Rehab PT Goals Patient Stated Goal: get back to my apartment PT Goal Formulation: With patient Time For Goal Achievement: 11/22/13 Potential to Achieve Goals: Good  Visit Information  Last PT Received On: 11/08/13 Assistance Needed: +2 History of Present Illness: The patient is a 79 y.o. year-old female with history of ischemic cardiomyopathy with EF of 15-20%, paroxysmal atrial fibrillation, LBBB, T2DM, HTN, HLD who presents with fevers, cough, vomiting, sore throat x 5 days.  The patient was last at their baseline health approximately one week ago.  She lives at home alone and ambulates with a rolling walker.  She states that about 5 days ago she developed a cough  productive of clear phlegm. Her cough has gotten progressively worse. She has had associated fevers to 102 Fahrenheit, nausea, heaves, sore throat, sinus congestion with rhinorrhea. She has been getting progressively weaker over the last several days. 2 days ago she stopped taking her Lasix because she said that she was not eating or drinking very much. Today, she slid out of bed and had to crawl on the floor to the front door in order to get help.  EMS was called to transport her to the emergency department. She with mildly hypoxic to the mid 80s and required nasal cannula.       Prior Functioning  Home Living Family/patient expects to be discharged to:: Private residence Living Arrangements: Alone Type of Home: Apartment Home Access: Level entry Home Layout: One level Home Equipment: Walker - 2 wheels;Cane - single point;Grab bars - tub/shower Prior Function Level of Independence: Independent Communication Communication: No difficulties    Cognition  Cognition Arousal/Alertness: Awake/alert Behavior During Therapy: WFL for tasks assessed/performed Overall Cognitive Status: Within Functional Limits for tasks assessed    Extremity/Trunk Assessment Upper Extremity Assessment Upper Extremity Assessment: Defer to OT evaluation Lower Extremity Assessment Lower Extremity Assessment: Generalized weakness Cervical / Trunk Assessment Cervical / Trunk Assessment: Normal   Balance    End of Session PT - End of Session Equipment Utilized During Treatment: Gait belt Activity Tolerance: Patient limited by fatigue;Other (comment) (Limited by weakness) Patient left: in bed;with call bell/phone within reach  GP     Rebeca Alert, MPT Pager: [phone]

## 2013-11-08 NOTE — Evaluation (Signed)
Occupational Therapy Evaluation Patient Details Name: [name] MRN: [id number] DOB: 1923/05/06 Today's Date: 11/08/2013 Time: 4098-1191 OT Time Calculation (min): 22 min  OT Assessment / Plan / Recommendation History of present illness The patient is a 79 y.o. year-old female with history of ischemic cardiomyopathy with EF of 15-20%, paroxysmal atrial fibrillation, LBBB, T2DM, HTN, HLD who presents with fevers, cough, vomiting, sore throat x 5 days.  The patient was last at their baseline health approximately one week ago.  She lives at home alone and ambulates with a rolling walker.  She states that about 5 days ago she developed a cough productive of clear phlegm. Her cough has gotten progressively worse. She has had associated fevers to 102 Fahrenheit, nausea, heaves, sore throat, sinus congestion with rhinorrhea. She has been getting progressively weaker over the last several days. 2 days ago she stopped taking her Lasix because she said that she was not eating or drinking very much. Today, she slid out of bed and had to crawl on the floor to the front door in order to get help.  EMS was called to transport her to the emergency department. She with mildly hypoxic to the mid 80s and required nasal cannula.   Clinical Impression   Pt presents to OT with decreased I with ADL activity s/p admission to hospital . Pt will benefit from skilled OT to increase I with ADL activity and return to PLOF    OT Assessment  Patient needs continued OT Services    Follow Up Recommendations  SNF    Barriers to Discharge Decreased caregiver support    Equipment Recommendations  None recommended by OT    Recommendations for Other Services    Frequency  Min 2X/week    Precautions / Restrictions Precautions Precautions: Fall Precaution Comments: watch oxygen saturation       ADL  Grooming: Set up Where Assessed - Grooming: Unsupported sitting Upper Body Bathing: Minimal assistance Where  Assessed - Upper Body Bathing: Unsupported sitting Lower Body Bathing: Maximal assistance Where Assessed - Lower Body Bathing: Supported sit to stand Upper Body Dressing: Minimal assistance Where Assessed - Upper Body Dressing: Unsupported sitting Lower Body Dressing: Maximal assistance Where Assessed - Lower Body Dressing: Supported sit to Pharmacist, hospital: +1 Total assistance Toilet Transfer Method: Sit to stand ADL Comments: Pt agrees she will need SNF upon DC    OT Diagnosis: Generalized weakness  OT Problem List: Decreased strength;Decreased activity tolerance;Decreased knowledge of use of DME or AE;Impaired vision/perception;Cardiopulmonary status limiting activity OT Treatment Interventions: Self-care/ADL training;Therapeutic exercise;Therapeutic activities;Energy conservation;DME and/or AE instruction;Patient/family education   OT Goals(Current goals can be found in the care plan section) Acute Rehab OT Goals Patient Stated Goal: get back to my apartment OT Goal Formulation: With patient Time For Goal Achievement: 11/22/13  Visit Information  Last OT Received On: 11/08/13 Assistance Needed: +2 History of Present Illness: The patient is a 79 y.o. year-old female with history of ischemic cardiomyopathy with EF of 15-20%, paroxysmal atrial fibrillation, LBBB, T2DM, HTN, HLD who presents with fevers, cough, vomiting, sore throat x 5 days.  The patient was last at their baseline health approximately one week ago.  She lives at home alone and ambulates with a rolling walker.  She states that about 5 days ago she developed a cough productive of clear phlegm. Her cough has gotten progressively worse. She has had associated fevers to 102 Fahrenheit, nausea, heaves, sore throat, sinus congestion with rhinorrhea. She has been getting progressively weaker  over the last several days. 2 days ago she stopped taking her Lasix because she said that she was not eating or drinking very much. Today,  she slid out of bed and had to crawl on the floor to the front door in order to get help.  EMS was called to transport her to the emergency department. She with mildly hypoxic to the mid 80s and required nasal cannula.       Prior Functioning     Home Living Family/patient expects to be discharged to:: Private residence Living Arrangements: Alone Type of Home: Apartment Home Access: Level entry Home Layout: One level Home Equipment: Walker - 2 wheels;Cane - single point;Grab bars - tub/shower Prior Function Level of Independence: Independent Communication Communication: No difficulties         Vision/Perception Vision - History Visual History: Macular degeneration Patient Visual Report: No change from baseline   Cognition  Cognition Arousal/Alertness: Awake/alert Behavior During Therapy: WFL for tasks assessed/performed Overall Cognitive Status: Within Functional Limits for tasks assessed    Extremity/Trunk Assessment Upper Extremity Assessment Upper Extremity Assessment: Generalized weakness     Mobility Bed Mobility Bed Mobility: Supine to Sit Supine to Sit: 3: Mod assist;With rails;HOB elevated Transfers Transfers: Sit to Stand;Stand to Sit Sit to Stand: 2: Max assist;From bed;With upper extremity assist Stand to Sit: 2: Max assist;To bed;With upper extremity assist Details for Transfer Assistance: Upon standing pt became shaky and began coughing.  Pt unable to cough muscus up.  Respiratory came in for treatment and pt returned to supine,             End of Session OT - End of Session Activity Tolerance: Patient limited by fatigue Patient left: in bed;with call bell/phone within reach;with nursing/sitter in room  GO     Tanicia Wolaver, [place] [facility] 11/08/2013, 1:18 PM

## 2013-11-08 NOTE — Progress Notes (Signed)
Pt had 5 beats of Vtach. Pt asymptomatic, no chest pain or dizziness. Pt's BP was 88/58, HR 75. MD made aware of findings. MD will check a magnesium level, but no further orders at this time. Will continue to monitor pt.   Arta Bruce [facility] 11/08/2013

## 2013-11-08 NOTE — Progress Notes (Signed)
[facility] PROGRESS NOTE  [name] JYN:[id number] DOB: 14-Aug-1923 DOA: 11/07/2013 PCP: [name], MD  Assessment/Plan  Generalized weakness is likely secondary to viral infection such as influenza. No focal deficits suggestive of stroke.   - PT and OT evaluations  - May need rehabilitation at discharge   Acute hypoxic respiratory failure likely secondary to influenza pneumonia - Nasal cannula to keep oxygen saturations greater than 92%  - Wean oxygen as tolerated  -  Blood pressures low normal so will not try to diurese (no pulmonary edema on CXR either) -  Tamiflu day 2  Sepsis (tachycardia, fever) due to influenza, CXR without pneumonia, UA neg - Tamiflu day 2 - Blood cultures NGTD  Chronic systolic and diastolic heart failure, ejection fraction of 15-20%, proBNP 880 - Continue beta blocker, ARB, aspirin  - Restart oral lasix - Followed by cardiology, Doctor Eden Emms   Hypertension/hyperlipidemia, blood pressure stable. Continue beta blocker, ARB, statin   Paroxysmal atrial fibrillation, CHADS2vasc score high, however, patient deemed high falls risk therefore not on A/C  - Continue ASA and BB  -  Telemetry:  A-fib, rate controlled  GERD, stable, continue famotidine   T2DM, not on medications, CBG low normal - A1c pending  - continue SSI   Diet: Diabetic 2gm, 1.2L fluid restriction  Access: PIV  IVF: Saline lock.  Proph: lovenox   Code Status: Full  Family Communication: patient alone  Disposition Plan:   Pending improvement in breathing.    Consultants:  None  Procedures:  None  Antibiotics:  tamiflu day 2  HPI/Subjective:  Still feeling weak, but improved since admission.  Has cough and chest congestion but nothing is coming up.  Fevers are improving.  Denies dysuria, lower extremity edema, orthopnea, PND.  Slept well./  Objective: Filed Vitals:   11/07/13 1552 11/07/13 1744 11/07/13 2145 11/08/13 0655  BP: 123/99 133/54  117/95 100/46  Pulse:  91 83 78  Temp: 100.2 F (37.9 C) 102.4 F (39.1 C) 100.1 F (37.8 C) 99.6 F (37.6 C)  TempSrc: Oral Oral Oral Oral  Resp: 18 20 20 20   Height:  5\' 10"  (1.778 m)    Weight:  92.579 kg (204 lb 1.6 oz)  92.1 kg (203 lb 0.7 oz)  SpO2: 99% 98% 97% 97%    Intake/Output Summary (Last 24 hours) at 11/08/13 1035 Last data filed at 11/08/13 1029  Gross per 24 hour  Intake      0 ml  Output    251 ml  Net   -251 ml   Filed Weights   11/07/13 1744 11/08/13 0655  Weight: 92.579 kg (204 lb 1.6 oz) 92.1 kg (203 lb 0.7 oz)    Exam:   General:  No acute distress  HEENT:  NCAT, MMM  Cardiovascular:  Distant heart sounds, no obvious murmur, 2+ pulses, warm extremities  Respiratory:  Full expiratory wheeze bilaterally with rhonchi, no focal rales, no increased WOB  Abdomen:   NABS, soft, NT/ND  MSK:   Normal tone and bulk, no LEE  Neuro:  Grossly intact  Data Reviewed: Basic Metabolic Panel:  Recent Labs Lab 11/07/13 1344 11/08/13 0445  NA 136 135  K 3.7 3.2*  CL 99 98  CO2 26 22  GLUCOSE 128* 93  BUN 14 16  CREATININE 1.06 1.19*  CALCIUM 9.1 8.3*   Liver Function Tests:  Recent Labs Lab 11/07/13 1344  AST 26  ALT 19  ALKPHOS 66  BILITOT 0.5  PROT 7.2  ALBUMIN  3.4*   No results found for this basename: LIPASE, AMYLASE,  in the last 168 hours No results found for this basename: AMMONIA,  in the last 168 hours CBC:  Recent Labs Lab 11/07/13 1344 11/08/13 0445  WBC 6.2 5.3  NEUTROABS 4.7  --   HGB 13.1 11.9*  HCT 37.3 34.5*  MCV 90.8 91.3  PLT 122* 115*   Cardiac Enzymes: No results found for this basename: CKTOTAL, CKMB, CKMBINDEX, TROPONINI,  in the last 168 hours BNP (last 3 results)  Recent Labs  11/07/13 1412  PROBNP 880.1*   CBG:  Recent Labs Lab 11/07/13 1816 11/07/13 2213 11/08/13 0734  GLUCAP 90 103* 92    Recent Results (from the past 240 hour(s))  CULTURE, BLOOD (ROUTINE X 2)     Status: None    Collection Time    11/07/13  4:25 PM      Result Value Range Status   Specimen Description BLOOD LEFT ARM   Final   Special Requests BOTTLES DRAWN AEROBIC AND ANAEROBIC 4 CC EACH   Final   Culture  Setup Time     Final   Value: 11/07/2013 23:21     Performed at [facility]   Culture     Final   Value:        BLOOD CULTURE RECEIVED NO GROWTH TO DATE CULTURE WILL BE HELD FOR 5 DAYS BEFORE ISSUING A FINAL NEGATIVE REPORT     Performed at [facility]   Report Status PENDING   Incomplete  CULTURE, BLOOD (ROUTINE X 2)     Status: None   Collection Time    11/07/13  4:33 PM      Result Value Range Status   Specimen Description BLOOD RIGHT HAND   Final   Special Requests BOTTLES DRAWN AEROBIC ONLY 3CC   Final   Culture  Setup Time     Final   Value: 11/07/2013 23:21     Performed at [facility]   Culture     Final   Value:        BLOOD CULTURE RECEIVED NO GROWTH TO DATE CULTURE WILL BE HELD FOR 5 DAYS BEFORE ISSUING A FINAL NEGATIVE REPORT     Performed at [facility]   Report Status PENDING   Incomplete     Studies: Dg Chest 2 View  11/07/2013   CLINICAL DATA:  Cough and fever.  Weakness and fatigue.  EXAM: CHEST  2 VIEW  COMPARISON:  Chest x-ray 05/04/2012.  FINDINGS: Lung volumes are slightly low. Mild diffuse peribronchial cuffing. No definite acute consolidative airspace disease. No pleural effusions. No evidence of pulmonary edema. Heart size is normal. Mediastinal contours are unremarkable. Atherosclerosis in the thoracic aorta.  IMPRESSION: 1. Mild diffuse peribronchial cuffing may suggest a bronchitis. 2. Atherosclerosis.   Electronically Signed   By: Trudie Reed M.D.   On: 11/07/2013 14:36    Scheduled Meds: . aspirin  81 mg Oral Daily  . benzonatate  100 mg Oral BID  . carvedilol  12.5 mg Oral BID WC  . enoxaparin (LOVENOX) injection  40 mg Subcutaneous Q24H  . famotidine  20 mg Oral QHS  . [START ON 11/09/2013] furosemide  40 mg  Intravenous Daily  . insulin aspart  0-9 Units Subcutaneous TID WC  . losartan  50 mg Oral BID  . mupirocin ointment   Topical BID  . oseltamivir  30 mg Oral BID  . polyethylene glycol  17 g  Oral Daily  . senna  1 tablet Oral BID  . simvastatin  40 mg Oral QHS  . sodium chloride  3 mL Intravenous Q12H   Continuous Infusions:   Principal Problem:   Acute on chronic combined systolic and diastolic congestive heart failure Active Problems:   DIABETES MELLITUS   HYPERLIPIDEMIA-MIXED   HYPERTENSION, BENIGN   Atrial fibrillation   GERD   Influenza   Acute respiratory failure with hypoxia   Fever   Generalized weakness    Time spent: 30 min    [name], [facility]  [facility] Pager [phone]. If 7PM-7AM, please contact night-coverage at [email or web address], password [facility] 11/08/2013, 10:35 AM  LOS: 1 day

## 2013-11-08 NOTE — Progress Notes (Signed)
INITIAL NUTRITION ASSESSMENT  DOCUMENTATION CODES Per approved criteria  -Not Applicable   INTERVENTION: Provide Ensure Complete once daily Encourage PO intake  NUTRITION DIAGNOSIS: Inadequate oral intake related to poor appetite as evidenced by pt's report and 3% weight loss.   Goal: Pt to meet >/= 90% of their estimated nutrition needs   Monitor:  PO intake Weight Labs  Reason for Assessment: Malnutrition Screening Tool, score of 5  79 y.o. female  Admitting Dx: Acute on chronic combined systolic and diastolic congestive heart failure  ASSESSMENT: 79 y.o. year-old female with history of ischemic cardiomyopathy with EF of 15-20%, paroxysmal atrial fibrillation, LBBB, T2DM, HTN, HLD who presents with fevers, cough, vomiting, sore throat x 5 days. She has been getting progressively weaker over the last several days. 2 days ago she stopped taking her Lasix because she said that she was not eating or drinking very much. Today, she slid out of bed and had to crawl on the floor to the front door in order to get help. EMS was called to transport her to the emergency department.  Per nursing notes pt ate 90% of breakfast and 100% of lunch today. Pt states that she did not eat for 2 days PTA except for a few peanut butter crackers; she reports having a poor appetite today and only eating a few bites at meals today. Pt denies recent weight loss. Weight history shows 7 lb weight loss (3% of body weight) in the past 3 months which is not significant.   Height: Ht Readings from Last 1 Encounters:  11/07/13 5\' 10"  (1.778 m)    Weight: Wt Readings from Last 1 Encounters:  11/08/13 203 lb 0.7 oz (92.1 kg)    Ideal Body Weight: 150 lbs  % Ideal Body Weight: 135%  Wt Readings from Last 10 Encounters:  11/08/13 203 lb 0.7 oz (92.1 kg)  08/02/13 210 lb (95.255 kg)  01/12/13 213 lb (96.616 kg)  10/08/12 202 lb (91.627 kg)  05/05/12 194 lb (87.998 kg)  05/05/12 194 lb (87.998 kg)   01/31/12 200 lb (90.719 kg)  07/23/11 204 lb (92.534 kg)  01/24/11 200 lb (90.719 kg)  07/31/10 198 lb (89.812 kg)    Usual Body Weight: 210 lbs  % Usual Body Weight: 97%  BMI:  Body mass index is 29.13 kg/(m^2).  Estimated Nutritional Needs: Kcal: 1700-1900 Protein: 90-100 grams Fluid: 1200 ml fluid restriction per MD  Skin: bilateral elbow abrasions  Diet Order: Carb Control  EDUCATION NEEDS: -No education needs identified at this time   Intake/Output Summary (Last 24 hours) at 11/08/13 1459 Last data filed at 11/08/13 1401  Gross per 24 hour  Intake    480 ml  Output    251 ml  Net    229 ml    Last BM: 12/22  Labs:   Recent Labs Lab 11/07/13 1344 11/08/13 0445  NA 136 135  K 3.7 3.2*  CL 99 98  CO2 26 22  BUN 14 16  CREATININE 1.06 1.19*  CALCIUM 9.1 8.3*  GLUCOSE 128* 93    CBG (last 3)   Recent Labs  11/07/13 2213 11/08/13 0734 11/08/13 1136  GLUCAP 103* 92 106*    Scheduled Meds: . ipratropium  0.5 mg Nebulization TID   And  . albuterol  2.5 mg Nebulization TID  . aspirin  81 mg Oral Daily  . benzonatate  100 mg Oral BID  . carvedilol  12.5 mg Oral BID WC  . enoxaparin (LOVENOX)  injection  40 mg Subcutaneous Q24H  . famotidine  20 mg Oral QHS  . [START ON 11/09/2013] furosemide  40 mg Oral Daily  . insulin aspart  0-9 Units Subcutaneous TID WC  . losartan  50 mg Oral BID  . mupirocin ointment   Topical BID  . oseltamivir  30 mg Oral BID  . polyethylene glycol  17 g Oral Daily  . senna  1 tablet Oral BID  . simvastatin  40 mg Oral QHS  . sodium chloride  3 mL Intravenous Q12H    Continuous Infusions:   Past Medical History  Diagnosis Date  . Cholelithiasis   . Other postprocedural status(V45.89)   . Atrial fibrillation   . Hepatic cyst   . GERD (gastroesophageal reflux disease)   . LBBB (left bundle branch block)   . Diabetes mellitus   . Hyperlipidemia   . Hypertension   . Ischemic cardiomyopathy   . Dizziness      with some gait disability  . Peripheral edema   . Constipation   . Macular degeneration     Past Surgical History  Procedure Laterality Date  . Wrist surgery Bilateral     wrist fractures  . Dilation and curettage    . Hip arthroplasty  05/05/2012    Procedure: ARTHROPLASTY BIPOLAR HIP;  Surgeon: Shelda Pal, MD;  Location: [facility];  Service: Orthopedics;  Laterality: Left;  . Cholecystectomy    . Ankle surgery Bilateral     fractures  . Tonsillectomy      Ian Malkin RD, LDN Inpatient Clinical Dietitian Pager: [phone] After Hours Pager: [phone]

## 2013-11-09 DIAGNOSIS — J111 Influenza due to unidentified influenza virus with other respiratory manifestations: Secondary | ICD-10-CM

## 2013-11-09 DIAGNOSIS — N179 Acute kidney failure, unspecified: Secondary | ICD-10-CM

## 2013-11-09 LAB — BASIC METABOLIC PANEL
BUN: 30 mg/dL — ABNORMAL HIGH (ref 6–23)
Calcium: 8.7 mg/dL (ref 8.4–10.5)
Chloride: 98 mEq/L (ref 96–112)
GFR calc Af Amer: 38 mL/min — ABNORMAL LOW (ref >90)
GFR calc non Af Amer: 33 mL/min — ABNORMAL LOW (ref >90)
Glucose, Bld: 116 mg/dL — ABNORMAL HIGH (ref 70–99)
Potassium: 3.6 mEq/L (ref 3.5–5.1)
Sodium: 135 mEq/L (ref 135–145)

## 2013-11-09 LAB — CBC
Hemoglobin: 11.8 g/dL — ABNORMAL LOW (ref 12.0–15.0)
MCHC: 34.5 g/dL (ref 30.0–36.0)
RBC: 3.77 MIL/uL — ABNORMAL LOW (ref 3.87–5.11)
WBC: 4.7 10*3/uL (ref 4.0–10.5)

## 2013-11-09 LAB — GLUCOSE, CAPILLARY: Glucose-Capillary: 78 mg/dL (ref 70–99)

## 2013-11-09 MED ORDER — BENZONATATE 100 MG PO CAPS: 100.0000 mg | ORAL_CAPSULE | Freq: Two times a day (BID) | ORAL | Status: DC

## 2013-11-09 MED ORDER — OSELTAMIVIR PHOSPHATE 30 MG PO CAPS: 30.0000 mg | ORAL_CAPSULE | Freq: Two times a day (BID) | ORAL | Status: DC

## 2013-11-09 MED ORDER — ENSURE COMPLETE PO LIQD: 237.0000 mL | ORAL | Status: DC

## 2013-11-09 MED ORDER — SODIUM CHLORIDE 0.9 % IV SOLN
INTRAVENOUS | Status: AC
  Administered 2013-11-09: 08:00:00 via INTRAVENOUS

## 2013-11-09 MED ORDER — FUROSEMIDE 40 MG PO TABS: 40.0000 mg | ORAL_TABLET | Freq: Every day | ORAL | Status: DC | PRN

## 2013-11-09 MED ORDER — POLYETHYLENE GLYCOL 3350 17 G PO PACK: 17.0000 g | PACK | Freq: Every day | ORAL | Status: DC

## 2013-11-09 MED ORDER — SENNA 8.6 MG PO TABS: 1.0000 | ORAL_TABLET | Freq: Two times a day (BID) | ORAL | Status: DC

## 2013-11-09 MED ORDER — GUAIFENESIN-DM 100-10 MG/5ML PO SYRP: 10.0000 mL | ORAL_SOLUTION | ORAL | Status: DC | PRN

## 2013-11-09 NOTE — Discharge Summary (Addendum)
Physician Discharge Summary  [name]:[id number] DOB: 21-Dec-1922 DOA: 11/07/2013  PCP: [name], MD  Admit date: 11/07/2013 Discharge date: 11/09/2013  Recommendations for Outpatient Follow-up:  1. Transfer to SNF for ongoing PT/OT 2. Follow up on final report of blood cultures 3. Lasix changed to prn weight gain, edema and losartan held 4. Continue tamiflu through 12/25, then stop 5. Cardiology in 1 week for follow up of chronic systolic and diastolic heart failure.  Please perform BMP to check creatinine and potassium.  Consider changing lasix back to scheduled daily and restart ARB if potassium/creatinine allow.    Discharge Diagnoses:  Principal Problem:   Influenza Active Problems:   DIABETES MELLITUS   HYPERLIPIDEMIA-MIXED   HYPERTENSION, BENIGN   Atrial fibrillation   GERD   Acute respiratory failure with hypoxia   Fever   Generalized weakness   Influenza with respiratory manifestations   Discharge Condition: stable, improved  Diet recommendation: healthy heart, 2gm sodium  Wt Readings from Last 3 Encounters:  11/09/13 91.536 kg (201 lb 12.8 oz)  08/02/13 95.255 kg (210 lb)  01/12/13 96.616 kg (213 lb)    History of present illness:  The patient is a 79 y.o. year-old female with history of ischemic cardiomyopathy with EF of 15-20%, paroxysmal atrial fibrillation, LBBB, T2DM, HTN, HLD who presents with fevers, cough, vomiting, sore throat x 5 days. The patient was last at their baseline health approximately one week ago. She lives at home alone and ambulates with a rolling walker. She states that about 5 days ago she developed a cough productive of clear phlegm. Her cough has gotten progressively worse. She has had associated fevers to 102 Fahrenheit, nausea, heaves, sore throat, sinus congestion with rhinorrhea. She has been getting progressively weaker over the last several days. 2 days ago she stopped taking her Lasix because she said that she was  not eating or drinking very much. Today, she slid out of bed and had to crawl on the floor to the front door in order to get help. EMS was called to transport her to the emergency department. She with mildly hypoxic to the mid 80s and required nasal cannula.   In the emergency department, her temperature was 100.2 Fahrenheit, pulse 102. Her oxygen levels decreased to the mid-80s on room air. Her labs were notable for white blood cell count of 6.2, platelets 122. Troponin negative, ProBNP 880. Electrolytes were within normal limits. Her chest x-ray demonstrated Diffuse peribronchial cuffing suggestive of bronchitis, in no acute consolidative airspace disease. She was given 500 mL of IV fluids and a dose of Zofran. After her IV fluids, she had increasing wheezing. Although many years ago she was told she may have COPD she has subsequently had pulmonary function tests which were completely normal. She does not have asthma or COPD. I have ordered a dose of Lasix 20 mg IV once to be given in the emergency department.   Hospital Course:   Generalized weakness is likely secondary to influenza. No focal deficits suggestive of stroke.  PT and OT recommending SNF for ongoing rehabilitation.    Acute hypoxic respiratory failure due to influenza.  She was placed on nasal canula which she has persistently required.  Efforts to change to room air resulted in oxygen levels dropping to mid-80s.  She was given a dose of IV lasix and then restarted on her home lasix dose in case she had a mild exacerbation of systolic heart failure, however, this resulted in dehydration.  Her hypoxia,  therefore, is likely predominantly due to flu.  She was started on tamiflu.  Despite PFTs which did not demonstrated asthma or COPD, she may have some bronchiolitic wheezing so she was given a trial of duonebs.  She may need oxygen at discharge.    Sepsis (tachycardia, fever) due to influenza, CXR without pneumonia, UA neg.  Continue tamiflu  to complete a 5 day course.  Last doses on 12/25, then stop.  Supervising physician to please follow up on final report of pending blood cultures.    Chronic systolic and diastolic heart failure, ejection fraction of 15-20%, proBNP 880.  Continue beta blocker, ARB, aspirin.  Initially attempted some gentle diuresis, however, she developed AKI.  Her lasix was held on 12/23 and she was given some gentle hydration.  Plan to repeat BMP in the AM and if renal function improving, plan to discharge on lasix 40mg  daily as needed for weight gain of 3-lbs in one day or 5-lbs in one week.  She should follow up with her cardiologist in 1 to 2 weeks for reevaluation and to determine if she may resume her scheduled daily dose.  Plan to restart ARB if creatinine back to baseline.   Followed by Dr. Eden Emms.  Hypertension/hyperlipidemia, blood pressure stable. Continue beta blocker, statin.  ARB held due to mild AKI.    AKI, BUN trended up to 30 and creatinine up to 1.38.  Held ARB and diuretic and gave Odes Lolli period of IVF -  Repeat creatinine in AM  Paroxysmal atrial fibrillation, CHADS2vasc score high, however, patient deemed high falls risk therefore not on A/C.  Continue ASA and BB.  Telemetry demonstrates intermittent sinus rhythm with sinus arrhythmia and atrial fibrillation, rate generally controlled.    GERD, stable, continue famotidine   T2DM, A1c 6, CBG low normal.  Continue diabetic diet.    Consultants:  None Procedures:  None Antibiotics:  tamiflu 12/21    Discharge Exam: Filed Vitals:   11/09/13 1333  BP: 130/60  Pulse: 75  Temp: 98.4 F (36.9 C)  Resp: 18   Filed Vitals:   11/09/13 0536 11/09/13 0744 11/09/13 1124 11/09/13 1333  BP: 101/48 113/53  130/60  Pulse: 78 70  75  Temp: 98.4 F (36.9 C)   98.4 F (36.9 C)  TempSrc: Oral   Oral  Resp: 18   18  Height:      Weight: 91.536 kg (201 lb 12.8 oz)     SpO2: 95%  85% 100%    General: CF, No acute distress  HEENT: NCAT,  MMM, nasal canula in place Cardiovascular: Distant heart sounds, nl S1, S2, no obvious murmur, 2+ pulses, warm extremities  Respiratory: Full expiratory wheeze bilaterally with rhonchi, no focal rales, no increased WOB  Abdomen: NABS, soft, NT/ND  MSK: Normal tone and bulk, no LEE  Neuro: Grossly intact   Discharge Instructions       Future Appointments Provider Department Dept Phone   02/02/2014 2:00 PM Wendall Stade, MD [facility] [place] Office [phone]       Medication List    STOP taking these medications       losartan 50 MG tablet  Commonly known as:  COZAAR      TAKE these medications       acetaminophen 500 MG tablet  Commonly known as:  TYLENOL  Take 500 mg by mouth every 6 (six) hours as needed for pain.     aspirin 81 MG chewable tablet  Chew 81  mg by mouth daily.     benzonatate 100 MG capsule  Commonly known as:  TESSALON  Take 1 capsule (100 mg total) by mouth 2 (two) times daily.     beta carotene w/minerals tablet  Take 1 tablet by mouth 2 (two) times daily.     carvedilol 12.5 MG tablet  Commonly known as:  COREG  Take 1 tablet (12.5 mg total) by mouth 2 (two) times daily with a meal.     famotidine 20 MG tablet  Commonly known as:  PEPCID  Take 20 mg by mouth at bedtime.     feeding supplement (ENSURE COMPLETE) Liqd  Take 237 mLs by mouth daily.     furosemide 40 MG tablet  Commonly known as:  LASIX  Take 1 tablet (40 mg total) by mouth daily as needed for fluid or edema (wt gain 3-lbs in 1 day or 5-lbs in 1 week).     guaiFENesin-dextromethorphan 100-10 MG/5ML syrup  Commonly known as:  ROBITUSSIN DM  Take 10 mLs by mouth every 4 (four) hours as needed for cough.     LUCENTIS IO  Inject into the eye. Eye injection into right eye every six weeks     oseltamivir 30 MG capsule  Commonly known as:  TAMIFLU  Take 1 capsule (30 mg total) by mouth 2 (two) times daily.     polyethylene glycol packet  Commonly known as:  MIRALAX  / GLYCOLAX  Take 17 g by mouth daily.     senna 8.6 MG Tabs tablet  Commonly known as:  SENOKOT  Take 1 tablet (8.6 mg total) by mouth 2 (two) times daily.     simvastatin 40 MG tablet  Commonly known as:  ZOCOR  Take 1 tablet (40 mg total) by mouth at bedtime.          The results of significant diagnostics from this hospitalization (including imaging, microbiology, ancillary and laboratory) are listed below for reference.    Significant Diagnostic Studies: Dg Chest 2 View  11/07/2013   CLINICAL DATA:  Cough and fever.  Weakness and fatigue.  EXAM: CHEST  2 VIEW  COMPARISON:  Chest x-ray 05/04/2012.  FINDINGS: Lung volumes are slightly low. Mild diffuse peribronchial cuffing. No definite acute consolidative airspace disease. No pleural effusions. No evidence of pulmonary edema. Heart size is normal. Mediastinal contours are unremarkable. Atherosclerosis in the thoracic aorta.  IMPRESSION: 1. Mild diffuse peribronchial cuffing may suggest a bronchitis. 2. Atherosclerosis.   Electronically Signed   By: Trudie Reed M.D.   On: 11/07/2013 14:36    Microbiology: Recent Results (from the past 240 hour(s))  CULTURE, BLOOD (ROUTINE X 2)     Status: None   Collection Time    11/07/13  4:25 PM      Result Value Range Status   Specimen Description BLOOD LEFT ARM   Final   Special Requests BOTTLES DRAWN AEROBIC AND ANAEROBIC 4 CC EACH   Final   Culture  Setup Time     Final   Value: 11/07/2013 23:21     Performed at [facility]   Culture     Final   Value:        BLOOD CULTURE RECEIVED NO GROWTH TO DATE CULTURE WILL BE HELD FOR 5 DAYS BEFORE ISSUING A FINAL NEGATIVE REPORT     Performed at [facility]   Report Status PENDING   Incomplete  CULTURE, BLOOD (ROUTINE X 2)     Status: None  Collection Time    11/07/13  4:33 PM      Result Value Range Status   Specimen Description BLOOD RIGHT HAND   Final   Special Requests BOTTLES DRAWN AEROBIC ONLY 3CC   Final    Culture  Setup Time     Final   Value: 11/07/2013 23:21     Performed at [facility]   Culture     Final   Value:        BLOOD CULTURE RECEIVED NO GROWTH TO DATE CULTURE WILL BE HELD FOR 5 DAYS BEFORE ISSUING A FINAL NEGATIVE REPORT     Performed at [facility]   Report Status PENDING   Incomplete  URINE CULTURE     Status: None   Collection Time    11/07/13  5:01 PM      Result Value Range Status   Specimen Description URINE, CLEAN CATCH   Final   Special Requests NONE   Final   Culture  Setup Time     Final   Value: 11/07/2013 23:56     Performed at [facility] Count     Final   Value: 10,000 COLONIES/ML     Performed at [facility]   Culture     Final   Value: Multiple bacterial morphotypes present, none predominant. Suggest appropriate recollection if clinically indicated.     Performed at [facility]   Report Status 11/08/2013 FINAL   Final     Labs: Basic Metabolic Panel:  Recent Labs Lab 11/07/13 1344 11/08/13 0445 11/08/13 1458 11/09/13 0410  NA 136 135  --  135  K 3.7 3.2*  --  3.6  CL 99 98  --  98  CO2 26 22  --  25  GLUCOSE 128* 93  --  116*  BUN 14 16  --  30*  CREATININE 1.06 1.19*  --  1.38*  CALCIUM 9.1 8.3*  --  8.7  MG  --   --  2.1  --    Liver Function Tests:  Recent Labs Lab 11/07/13 1344  AST 26  ALT 19  ALKPHOS 66  BILITOT 0.5  PROT 7.2  ALBUMIN 3.4*   No results found for this basename: LIPASE, AMYLASE,  in the last 168 hours No results found for this basename: AMMONIA,  in the last 168 hours CBC:  Recent Labs Lab 11/07/13 1344 11/08/13 0445 11/09/13 0410  WBC 6.2 5.3 4.7  NEUTROABS 4.7  --   --   HGB 13.1 11.9* 11.8*  HCT 37.3 34.5* 34.2*  MCV 90.8 91.3 90.7  PLT 122* 115* 119*   Cardiac Enzymes: No results found for this basename: CKTOTAL, CKMB, CKMBINDEX, TROPONINI,  in the last 168 hours BNP: BNP (last 3 results)  Recent Labs  11/07/13 1412  PROBNP 880.1*    CBG:  Recent Labs Lab 11/08/13 1136 11/08/13 1636 11/08/13 2108 11/09/13 0747 11/09/13 1138  GLUCAP 106* 94 93 78 137*    Time coordinating discharge: 45 minutes  Signed:  Jastin Fore  [facility] 11/09/2013, 2:57 PM

## 2013-11-09 NOTE — Progress Notes (Signed)
Physical Therapy Treatment Patient Details Name: SHERLON NIED MRN: [id number] DOB: 1923-08-26 Today's Date: 11/09/2013 Time: 8657-8469 PT Time Calculation (min): 25 min  PT Assessment / Plan / Recommendation  History of Present Illness The patient is a 79 y.o. year-old female with history of ischemic cardiomyopathy with EF of 15-20%, paroxysmal atrial fibrillation, LBBB, T2DM, HTN, HLD who presents with fevers, cough, vomiting, sore throat x 5 days.  The patient was last at their baseline health approximately one week ago.  She lives at home alone and ambulates with a rolling walker.  She states that about 5 days ago she developed a cough productive of clear phlegm. Her cough has gotten progressively worse. She has had associated fevers to 102 Fahrenheit, nausea, heaves, sore throat, sinus congestion with rhinorrhea. She has been getting progressively weaker over the last several days. 2 days ago she stopped taking her Lasix because she said that she was not eating or drinking very much. Today, she slid out of bed and had to crawl on the floor to the front door in order to get help.  EMS was called to transport her to the emergency department. She with mildly hypoxic to the mid 80s and required nasal cannula.   PT Comments   Progressing with mobility. Continues to fatigue easily and c/o dizziness.   Follow Up Recommendations  SNF     Does the patient have the potential to tolerate intense rehabilitation     Barriers to Discharge        Equipment Recommendations  None recommended by PT    Recommendations for Other Services OT consult  Frequency Min 3X/week   Progress towards PT Goals Progress towards PT goals: Progressing toward goals  Plan Current plan remains appropriate    Precautions / Restrictions Precautions Precautions: Fall Restrictions Weight Bearing Restrictions: No   Pertinent Vitals/Pain No c/o pain    Mobility  Bed Mobility Bed Mobility: Supine to Sit Supine  to Sit: 4: Min assist;HOB elevated;With rails Details for Bed Mobility Assistance: Increased time. Assist for trunk  Transfers Transfers: Stand Pivot Transfers Sit to Stand: 3: Mod assist;From bed;From chair/3-in-1 Stand to Sit: 3: Mod assist;To chair/3-in-1;To toilet Stand Pivot Transfers: 4: Min assist Details for Transfer Assistance: x 4. Assist to rise, stabilze, control descent. Cues for safety, hand placement Ambulation/Gait Ambulation/Gait Assistance: 1: +2 Total assist Ambulation/Gait: Patient Percentage: 80% Ambulation Distance (Feet): 35 Feet (35'x1, 15'x 1) Assistive device: Rolling walker Ambulation/Gait Assistance Details: Assist to stabilize throughout ambulation. Followed closely with recliner. Pt c/o dizziness.  Gait Pattern: Step-through pattern;Decreased stride length;Trunk flexed    Exercises     PT Diagnosis:    PT Problem List:   PT Treatment Interventions:     PT Goals (current goals can now be found in the care plan section)    Visit Information  Last PT Received On: 11/09/13 Assistance Needed: +2 History of Present Illness: The patient is a 79 y.o. year-old female with history of ischemic cardiomyopathy with EF of 15-20%, paroxysmal atrial fibrillation, LBBB, T2DM, HTN, HLD who presents with fevers, cough, vomiting, sore throat x 5 days.  The patient was last at their baseline health approximately one week ago.  She lives at home alone and ambulates with a rolling walker.  She states that about 5 days ago she developed a cough productive of clear phlegm. Her cough has gotten progressively worse. She has had associated fevers to 102 Fahrenheit, nausea, heaves, sore throat, sinus congestion with rhinorrhea. She has been  getting progressively weaker over the last several days. 2 days ago she stopped taking her Lasix because she said that she was not eating or drinking very much. Today, she slid out of bed and had to crawl on the floor to the front door in order to get  help.  EMS was called to transport her to the emergency department. She with mildly hypoxic to the mid 80s and required nasal cannula.    Subjective Data      Cognition  Cognition Arousal/Alertness: Awake/alert Behavior During Therapy: WFL for tasks assessed/performed Overall Cognitive Status: Within Functional Limits for tasks assessed    Balance     End of Session PT - End of Session Equipment Utilized During Treatment: Gait belt Activity Tolerance: Patient tolerated treatment well Patient left: in chair;with call bell/phone within reach   GP     Rebeca Alert, MPT Pager: [phone]

## 2013-11-09 NOTE — Progress Notes (Signed)
Clinical Social Work Department CLINICAL SOCIAL WORK PLACEMENT NOTE 11/09/2013  Patient:  [name], [name]  Account Number:  [id number] Admit date:  11/07/2013  Clinical Social Worker:  Orpah Greek  Date/time:  11/09/2013 11:07 AM  Clinical Social Work is seeking post-discharge placement for this patient at the following level of care:   SKILLED NURSING   (*CSW will update this form in Epic as items are completed)   11/09/2013  Patient/family provided with [facility] Department of Clinical Social Work's list of facilities offering this level of care within the geographic area requested by the patient (or if unable, by the patient's family).  11/09/2013  Patient/family informed of their freedom to choose among providers that offer the needed level of care, that participate in Medicare, Medicaid or managed care program needed by the patient, have an available bed and are willing to accept the patient.  11/09/2013  Patient/family informed of [facility]' ownership interest in [facility], as well as of the fact that they are under no obligation to receive care at this facility.  PASARR submitted to EDS on 11/09/2013 PASARR number received from [employer] on 11/09/2013  FL2 transmitted to all facilities in geographic area requested by pt/family on  11/09/2013 FL2 transmitted to all facilities within larger geographic area on   Patient informed that his/her managed care company has contracts with or will negotiate with  certain facilities, including the following:     Patient/family informed of bed offers received:   Patient chooses bed at  Physician recommends and patient chooses bed at    Patient to be transferred to  on   Patient to be transferred to facility by   The following physician request were entered in Epic:   Additional Comments:  Unice Bailey, LCSW [facility] Clinical Social Worker cell #: [phone]

## 2013-11-09 NOTE — Progress Notes (Signed)
PHARMACY CONSULT NOTE - INITIAL   Pharmacy Consult for CHF patient education  Patient Measurements: Height: 5\' 10"  (177.8 cm) Weight: 201 lb 12.8 oz (91.536 kg) IBW/kg (Calculated) : 68.5  Labs:  Recent Labs  11/07/13 1344 11/08/13 0445 11/08/13 1458 11/09/13 0410  WBC 6.2 5.3  --  4.7  HGB 13.1 11.9*  --  11.8*  HCT 37.3 34.5*  --  34.2*  PLT 122* 115*  --  119*  CREATININE 1.06 1.19*  --  1.38*  MG  --   --  2.1  --   ALBUMIN 3.4*  --   --   --   PROT 7.2  --   --   --   AST 26  --   --   --   ALT 19  --   --   --   ALKPHOS 66  --   --   --   BILITOT 0.5  --   --   --    Estimated Creatinine Clearance: 34.6 ml/min (by C-G formula based on Cr of 1.38).  Medical History: Past Medical History  Diagnosis Date  . Cholelithiasis   . Other postprocedural status(V45.89)   . Atrial fibrillation   . Hepatic cyst   . GERD (gastroesophageal reflux disease)   . LBBB (left bundle branch block)   . Diabetes mellitus   . Hyperlipidemia   . Hypertension   . Ischemic cardiomyopathy   . Dizziness     with some gait disability  . Peripheral edema   . Constipation   . Macular degeneration     Medications:  Prescriptions prior to admission  Medication Sig Dispense Refill  . acetaminophen (TYLENOL) 500 MG tablet Take 500 mg by mouth every 6 (six) hours as needed for pain.      Marland Kitchen aspirin 81 MG chewable tablet Chew 81 mg by mouth daily.      . beta carotene w/minerals (OCUVITE) tablet Take 1 tablet by mouth 2 (two) times daily.      . carvedilol (COREG) 12.5 MG tablet Take 1 tablet (12.5 mg total) by mouth 2 (two) times daily with a meal.  180 tablet  2  . famotidine (PEPCID) 20 MG tablet Take 20 mg by mouth at bedtime.      Marland Kitchen losartan (COZAAR) 50 MG tablet Take 1 tablet (50 mg total) by mouth 2 (two) times daily.  180 tablet  2  . Ranibizumab (LUCENTIS IO) Inject into the eye. Eye injection into right eye every six weeks      . simvastatin (ZOCOR) 40 MG tablet Take 1 tablet  (40 mg total) by mouth at bedtime.  90 tablet  2  . [DISCONTINUED] furosemide (LASIX) 40 MG tablet Take 40 mg by mouth daily.         Scheduled:  . ipratropium  0.5 mg Nebulization TID   And  . albuterol  2.5 mg Nebulization TID  . aspirin  81 mg Oral Daily  . benzonatate  100 mg Oral BID  . carvedilol  12.5 mg Oral BID WC  . enoxaparin (LOVENOX) injection  40 mg Subcutaneous Q24H  . famotidine  20 mg Oral QHS  . feeding supplement (ENSURE COMPLETE)  237 mL Oral Q24H  . mupirocin ointment   Topical BID  . oseltamivir  30 mg Oral BID  . polyethylene glycol  17 g Oral Daily  . senna  1 tablet Oral BID  . simvastatin  40 mg Oral QHS  . sodium  chloride  3 mL Intravenous Q12H   Infusions:   PRN: acetaminophen, acetaminophen, bisacodyl, guaiFENesin-dextromethorphan, HYDROcodone-acetaminophen, ondansetron (ZOFRAN) IV, ondansetron  Plan:   Provided verbal counseling and printed drug information for CHF medications.  Lynann Beaver PharmD, BCPS Pager [phone] 11/09/2013 3:30 PM

## 2013-11-09 NOTE — Progress Notes (Signed)
Clinical Social Work Department BRIEF PSYCHOSOCIAL ASSESSMENT 11/09/2013  Patient:  [name], [name]     Account Number:  [id number]     Admit date:  11/07/2013  Clinical Social Worker:  Orpah Greek  Date/Time:  11/09/2013 10:50 AM  Referred by:  Physician  Date Referred:  11/09/2013 Referred for  SNF Placement   Other Referral:   Interview type:  Patient Other interview type:    PSYCHOSOCIAL DATA Living Status:  ALONE Admitted from facility:   Level of care:   Primary support name:  Nils Pyle (niece) ph#: [phone] Primary support relationship to patient:  FAMILY Degree of support available:   good    CURRENT CONCERNS Current Concerns  Post-Acute Placement   Other Concerns:    SOCIAL WORK ASSESSMENT / PLAN CSW received consult for SNF placement, PT recommended SNF for patient at discharge.   Assessment/plan status:  Information/Referral to [facility] Other assessment/ plan:   Information/referral to community resources:   CSW completed FL2 and faxed information out, per Dr. Malachi Bonds patient is requesting [facility]. CSW awaiting call back from [place] re: bed offer.    PATIENT'S/FAMILY'S RESPONSE TO PLAN OF CARE: Patient states that she's been to [facility] last summer but would prefer to go to [facility] if still needed at discharge. CSW awaiting bed offer and then will submit from [facility] authorization.       Unice Bailey, LCSW [facility] Clinical Social Worker cell #: [phone]

## 2013-11-09 NOTE — Progress Notes (Signed)
Received [facility] Medicare authorization for patient to go to [facility], facility unable to admit her until the morning.   CSW will follow-up in the am.   Clinical Social Work Department CLINICAL SOCIAL WORK PLACEMENT NOTE 11/09/2013  Patient:  [name], [name]  Account Number:  [id number] Admit date:  11/07/2013  Clinical Social Worker:  Orpah Greek  Date/time:  11/09/2013 11:07 AM  Clinical Social Work is seeking post-discharge placement for this patient at the following level of care:   SKILLED NURSING   (*CSW will update this form in Epic as items are completed)   11/09/2013  Patient/family provided with [facility] Department of Clinical Social Work's list of facilities offering this level of care within the geographic area requested by the patient (or if unable, by the patient's family).  11/09/2013  Patient/family informed of their freedom to choose among providers that offer the needed level of care, that participate in Medicare, Medicaid or managed care program needed by the patient, have an available bed and are willing to accept the patient.  11/09/2013  Patient/family informed of [facility]' ownership interest in [facility], as well as of the fact that they are under no obligation to receive care at this facility.  PASARR submitted to EDS on 11/09/2013 PASARR number received from EDS on 11/09/2013  FL2 transmitted to all facilities in geographic area requested by pt/family on  11/09/2013 FL2 transmitted to all facilities within larger geographic area on   Patient informed that his/her managed care company has contracts with or will negotiate with  certain facilities, including the following:     Patient/family informed of bed offers received:  11/09/2013 Patient chooses bed at [facility] Physician recommends and patient chooses bed at    Patient to be transferred to [facility] on  11/10/2013 Patient to be transferred to facility by    The following physician request were entered in Epic:   Additional Comments:   Unice Bailey, LCSW [facility] Clinical Social Worker cell #: [phone]

## 2013-11-09 NOTE — Care Management Note (Signed)
   CARE MANAGEMENT NOTE 11/09/2013  Patient:  [name], [name]   Account Number:  [id number]  Date Initiated:  11/09/2013  Documentation initiated by:  Shante Archambeault  Subjective/Objective Assessment:   79 yo female admitted with Acute on chronic systolic and diastolic heart failure. PCP:  [name], MD.     Action/Plan:   SNF   Anticipated DC Date:     Anticipated DC Plan:  SKILLED NURSING FACILITY  In-house referral  Clinical Social Worker      DC Associate Professor  CM consult      Choice offered to / List presented to:  NA   DME arranged  NA      DME agency  NA     HH arranged  NA      HH agency  NA   Status of service:  Completed, signed off Medicare Important Message given?   (If response is "NO", the following Medicare IM given date fields will be blank) Date Medicare IM given:   Date Additional Medicare IM given:    Discharge Disposition:    Per UR Regulation:  Reviewed for med. necessity/level of care/duration of stay  If discussed at Long Length of Stay Meetings, dates discussed:    Comments:  11/09/13 1309 Kristen Gittleman,MSN,RN [phone] Chart reviewed for utilization of services. Pt recommendation for SNF. CSW to follow.

## 2013-11-10 DIAGNOSIS — N179 Acute kidney failure, unspecified: Secondary | ICD-10-CM

## 2013-11-10 LAB — CBC
HCT: 35.3 % — ABNORMAL LOW (ref 36.0–46.0)
MCHC: 34.8 g/dL (ref 30.0–36.0)
RBC: 3.84 MIL/uL — ABNORMAL LOW (ref 3.87–5.11)
RDW: 13.2 % (ref 11.5–15.5)

## 2013-11-10 LAB — BASIC METABOLIC PANEL
BUN: 25 mg/dL — ABNORMAL HIGH (ref 6–23)
CO2: 26 mEq/L (ref 19–32)
Chloride: 102 mEq/L (ref 96–112)
Creatinine, Ser: 0.92 mg/dL (ref 0.50–1.10)
GFR calc Af Amer: 63 mL/min — ABNORMAL LOW (ref >90)
GFR calc non Af Amer: 54 mL/min — ABNORMAL LOW (ref >90)
Glucose, Bld: 95 mg/dL (ref 70–99)

## 2013-11-10 NOTE — Discharge Summary (Signed)
DC Addendum:  A please see discharge summary dictated by Dr. Malachi Bonds on 12/23. Patient's creatinine today now normal at 0.92. Patient initially on oxygen 2 L, however with ambulation, her oxygen saturations maintained at 93%. We'll plan to discontinue.  She is stable and cleared for discharge back to skilled nursing today.

## 2013-11-10 NOTE — Progress Notes (Signed)
Patient is set to discharge to [facility] today. Patient aware. Discharge packet given to RN, Shanda Bumps. [facility] will be called for transport once finished with lunch.   Clinical Social Work Department CLINICAL SOCIAL WORK PLACEMENT NOTE 11/10/2013  Patient:  [name], [name]  Account Number:  [id number] Admit date:  11/07/2013  Clinical Social Worker:  Orpah Greek  Date/time:  11/09/2013 11:07 AM  Clinical Social Work is seeking post-discharge placement for this patient at the following level of care:   SKILLED NURSING   (*CSW will update this form in Epic as items are completed)   11/09/2013  Patient/family provided with [facility] Department of Clinical Social Work's list of facilities offering this level of care within the geographic area requested by the patient (or if unable, by the patient's family).  11/09/2013  Patient/family informed of their freedom to choose among providers that offer the needed level of care, that participate in Medicare, Medicaid or managed care program needed by the patient, have an available bed and are willing to accept the patient.  11/09/2013  Patient/family informed of [facility]' ownership interest in [facility], as well as of the fact that they are under no obligation to receive care at this facility.  PASARR submitted to EDS on 11/09/2013 PASARR number received from EDS on 11/09/2013  FL2 transmitted to all facilities in geographic area requested by pt/family on  11/09/2013 FL2 transmitted to all facilities within larger geographic area on   Patient informed that his/her managed care company has contracts with or will negotiate with  certain facilities, including the following:     Patient/family informed of bed offers received:  11/09/2013 Patient chooses bed at [facility] Physician recommends and patient chooses bed at    Patient to be transferred to [facility] on  11/10/2013 Patient to be transferred to  facility by [facility]  The following physician request were entered in Epic:   Additional Comments:   Unice Bailey, LCSW [facility] Clinical Social Worker cell #: [phone]

## 2013-11-10 NOTE — Progress Notes (Signed)
PT Cancellation Note  Patient Details Name: [name] MRN: [id number] DOB: 1923-10-26   Cancelled Treatment:    Reason Eval/Treat Not Completed: pt declined to participate with therapy since she is set to d/c to [facility] today.    Rebeca Alert, MPT Pager: [phone]

## 2013-11-12 ENCOUNTER — Non-Acute Institutional Stay (SKILLED_NURSING_FACILITY): Payer: Medicare HMO | Admitting: Adult Health

## 2013-11-12 DIAGNOSIS — I509 Heart failure, unspecified: Secondary | ICD-10-CM

## 2013-11-12 DIAGNOSIS — K5900 Constipation, unspecified: Secondary | ICD-10-CM

## 2013-11-12 DIAGNOSIS — R5381 Other malaise: Secondary | ICD-10-CM

## 2013-11-12 DIAGNOSIS — I4891 Unspecified atrial fibrillation: Secondary | ICD-10-CM

## 2013-11-12 DIAGNOSIS — R110 Nausea: Secondary | ICD-10-CM

## 2013-11-12 DIAGNOSIS — I5042 Chronic combined systolic (congestive) and diastolic (congestive) heart failure: Secondary | ICD-10-CM

## 2013-11-12 DIAGNOSIS — R5383 Other fatigue: Secondary | ICD-10-CM

## 2013-11-12 DIAGNOSIS — K219 Gastro-esophageal reflux disease without esophagitis: Secondary | ICD-10-CM

## 2013-11-12 DIAGNOSIS — E119 Type 2 diabetes mellitus without complications: Secondary | ICD-10-CM

## 2013-11-12 DIAGNOSIS — J111 Influenza due to unidentified influenza virus with other respiratory manifestations: Secondary | ICD-10-CM

## 2013-11-12 DIAGNOSIS — I10 Essential (primary) hypertension: Secondary | ICD-10-CM

## 2013-11-12 DIAGNOSIS — R531 Weakness: Secondary | ICD-10-CM

## 2013-11-13 LAB — CULTURE, BLOOD (ROUTINE X 2)
Culture: NO GROWTH
Culture: NO GROWTH

## 2013-11-14 DIAGNOSIS — R110 Nausea: Secondary | ICD-10-CM | POA: Insufficient documentation

## 2013-11-14 DIAGNOSIS — I5042 Chronic combined systolic (congestive) and diastolic (congestive) heart failure: Secondary | ICD-10-CM | POA: Insufficient documentation

## 2013-11-14 DIAGNOSIS — K5900 Constipation, unspecified: Secondary | ICD-10-CM | POA: Insufficient documentation

## 2013-11-14 NOTE — Progress Notes (Signed)
Patient ID: [name], female   DOB: 06/16/1923, 79 y.o.   MRN: [id number]                                   PROGRESS NOTE  DATE: 11/12/2013  FACILITY: Nursing Home Location: [facility]  LEVEL OF CARE: SNF (31)  Acute Visit  CHIEF COMPLAINT:  Follow-up hospitalization  HISTORY OF PRESENT ILLNESS: This is an 79 year old female who has been admitted to [facility] on 11/10/13 from [facility] with Influenza and Generalized Weakness. She has been admitted for a short-term rehabilitation.  REASSESSMENT OF ONGOING PROBLEM(S):  HTN: Pt 's HTN remains stable.  Denies CP, sob, DOE, pedal edema, headaches, dizziness or visual disturbances.  No complications from the medications currently being used.  Last BP :132/69  GERD: pt's GERD is stable.  Denies ongoing heartburn, abd. Pain, nausea or vomiting.  Currently on a PPI & tolerates it without any adverse reactions.  DM:pt's DM remains stable.  Pt denies polyuria, polydipsia, polyphagia, changes in vision or hypoglycemic episodes.  No complications noted from the medication presently being used.   12/24 hemoglobin A1c 6.0   PAST MEDICAL HISTORY : Reviewed.  No changes.  CURRENT MEDICATIONS: Reviewed per [facility]  REVIEW OF SYSTEMS:  GENERAL: no change in appetite, no fatigue, no weight changes, no fever, chills or weakness RESPIRATORY: no cough, SOB, DOE, hemoptysis, + wheezing CARDIAC: no chest pain, edema or palpitations GI: no abdominal pain, diarrhea, constipation, heart burn, + nausea  PHYSICAL EXAMINATION  VS:  T 97.8       P75      RR 20      BP 132/69     POX 96 %       GENERAL: no acute distress, normal body habitus EYES: conjunctivae normal, sclerae normal, normal eye lids NECK: supple, trachea midline, no neck masses, no thyroid tenderness, no thyromegaly LYMPHATICS: no LAN in the neck, no supraclavicular LAN RESPIRATORY: breathing is even & unlabored, +wheezing on bilateral upper lung  fields CARDIAC: RRR, no murmur,no extra heart sounds, no edema GI: abdomen soft, normal BS, no masses, no tenderness, no hepatomegaly, no splenomegaly PSYCHIATRIC: the patient is alert & oriented to person, affect & behavior appropriate  LABS/RADIOLOGY: 11/10/13 sodium 138 potassium 4.2 glucose 95 BUN 25 creatinine 0.92 calcium 8.9 wbc3.9 hgb12.3 hematocrit 35.3 11/08/13 hemoglobin A1c 6.0  11/07/13 influenza A (+),  Pro BNP 880.1   ASSESSMENT/PLAN:  Influenza - recently completed Tamiflu course; start Albuterol 2.5 mg/7ml 1 neb Q 8 hours x 1 week then Q 6 hours PRN  generalized weakness - for rehabilitation   Chronic systolic and diastolic heart failure - continue Coreg; weigh 3X/week; notify MD/NP +- 5lbs  Hyperlipidemia - continue Simvastatin  Constipation - continue Senokot and MiraLax  GERD - continue Pepcid  Hypertension - well controlled; continue Coreg  Paroxysmal atrial fibrillation - continue ASA and Coreg; BP/HR Q shift x 1 week  Diabetes Mellitus, type 2 - diet controlled; check CBG Q D x 1 week  Nausea - start Phenergan 25 mg 1 tab PO Q 6 hours PRN   CPT CODE: 40981

## 2013-11-22 DIAGNOSIS — I509 Heart failure, unspecified: Secondary | ICD-10-CM

## 2013-11-22 DIAGNOSIS — J111 Influenza due to unidentified influenza virus with other respiratory manifestations: Secondary | ICD-10-CM

## 2013-11-22 DIAGNOSIS — I10 Essential (primary) hypertension: Secondary | ICD-10-CM

## 2013-11-22 DIAGNOSIS — I5042 Chronic combined systolic (congestive) and diastolic (congestive) heart failure: Secondary | ICD-10-CM

## 2014-02-02 ENCOUNTER — Ambulatory Visit: Payer: Medicare PPO | Admitting: Cardiovascular Disease

## 2014-02-09 ENCOUNTER — Ambulatory Visit (INDEPENDENT_AMBULATORY_CARE_PROVIDER_SITE_OTHER): Payer: Commercial Managed Care - HMO | Admitting: Cardiovascular Disease

## 2014-02-09 ENCOUNTER — Encounter: Payer: Self-pay | Admitting: Cardiovascular Disease

## 2014-02-09 VITALS — BP 130/80 | HR 72 | Ht 70.0 in | Wt 204.1 lb

## 2014-02-09 DIAGNOSIS — I10 Essential (primary) hypertension: Secondary | ICD-10-CM

## 2014-02-09 DIAGNOSIS — I5022 Chronic systolic (congestive) heart failure: Secondary | ICD-10-CM

## 2014-02-09 DIAGNOSIS — I447 Left bundle-branch block, unspecified: Secondary | ICD-10-CM

## 2014-02-09 DIAGNOSIS — I4891 Unspecified atrial fibrillation: Secondary | ICD-10-CM

## 2014-02-09 NOTE — Assessment & Plan Note (Signed)
Maint NSR no anticoagulation due to age and multiple falls Needs walker and even with this not stable on feet

## 2014-02-09 NOTE — Assessment & Plan Note (Signed)
Euvolemic uses sliding scale lasix if weight up 3 lbs or more

## 2014-02-09 NOTE — Assessment & Plan Note (Signed)
Well controlled.  Continue current medications and low sodium Dash type diet.   Back on ACE and diuretic

## 2014-02-09 NOTE — Patient Instructions (Addendum)
Your physician recommends that you schedule a follow-up appointment in:  3 MONTHS WITH  DR NISHAN  Your physician recommends that you continue on your current medications as directed. Please refer to the Current Medication list given to you today.  

## 2014-02-09 NOTE — Assessment & Plan Note (Signed)
Chronic QRS actually narrower today  No high grade AV block or reason to lower beta blocker

## 2014-02-09 NOTE — Progress Notes (Signed)
Patient ID: [name], female   DOB: [birth year], 80 y.o.   MRN: [id number] Kristen LeatherwoodKatherine is seen today for F/U of PAF, LBBB, DCM with EF 20% no CAD by cath in 2009 S/P right hip surgery from fall 2011 She has had some continued right hip pain and is taking hydrocodone for it. Her BP has been better on home readings. Average systolics have been 12-140 mmHg after increasing her Benzipril to 40mg  she has not had any SSCP, palpitations, or syncope. She has vertiginous symptoms with change in position She also has stable mild LE edema.  BP is fine   Hospitalized in December with Flu and sepsis  Acute renal azotemia and PAF.  [facility]   She is back on both and [name] has rechecked BMET and patient indicates its ok       ROS: Denies fever, malais, weight loss, blurry vision, decreased visual acuity, cough, sputum, SOB, hemoptysis, pleuritic pain, palpitaitons, heartburn, abdominal pain, melena, lower extremity edema, claudication, or rash.  All other systems reviewed and negative  General: Affect appropriate Chronically ill elderly female  HEENT: normal Neck supple with no adenopathy JVP normal no bruits no thyromegaly Lungs clear with no wheezing and good diaphragmatic motion Heart:  S1/S2 no murmur, no rub, gallop or click PMI normal Abdomen: benighn, BS positve, no tenderness, no AAA no bruit.  No HSM or HJR Distal pulses intact with no bruits No edema Neuro non-focal Skin warm and dry No muscular weakness   Current Outpatient Prescriptions  Medication Sig Dispense Refill  . acetaminophen (TYLENOL) 500 MG tablet Take 500 mg by mouth every 6 (six) hours as needed for pain.      Marland Kitchen. aspirin 81 MG chewable tablet Chew 81 mg by mouth daily.      . beta carotene w/minerals (OCUVITE) tablet Take 1 tablet by mouth 2 (two) times daily.      . carvedilol (COREG) 12.5 MG tablet Take 1 tablet (12.5 mg total) by mouth 2 (two) times daily with a  meal.  180 tablet  2  . famotidine (PEPCID) 20 MG tablet Take 20 mg by mouth at bedtime.      . feeding supplement, ENSURE COMPLETE, (ENSURE COMPLETE) LIQD Take 237 mLs by mouth daily.  30 Bottle  0  . furosemide (LASIX) 40 MG tablet Take 1 tablet (40 mg total) by mouth daily as needed for fluid or edema (wt gain 3-lbs in 1 day or 5-lbs in 1 week).  30 tablet  0  . Magnesium Hydroxide (MILK OF MAGNESIA PO) Take by mouth.      . mirtazapine (REMERON) 15 MG tablet Take 15 mg by mouth at bedtime.      Marland Kitchen. omeprazole (PRILOSEC) 20 MG capsule Take 20 mg by mouth daily.      . polyethylene glycol (MIRALAX / GLYCOLAX) packet Take 17 g by mouth daily.  14 each  0  . Ranibizumab (LUCENTIS IO) Inject into the eye. Eye injection into right eye every six weeks      . simvastatin (ZOCOR) 40 MG tablet Take 1 tablet (40 mg total) by mouth at bedtime.  90 tablet  2   No current facility-administered medications for this visit.    Allergies  Fosamax; Penicillins; Sulfonamide derivatives; and Tetanus toxoid  Electrocardiogram:  09/22/12  SR LBBB   Today SR rate 76 IVCD LBBB LAD    Assessment and Plan

## 2014-02-28 ENCOUNTER — Telehealth: Payer: Self-pay | Admitting: Cardiovascular Disease

## 2014-02-28 NOTE — Telephone Encounter (Signed)
New message     Need something gentric saying patient is clear  for surgery.  Surgery on  5/12 @ 8:30

## 2014-03-01 NOTE — Telephone Encounter (Signed)
[facility]

## 2014-03-01 NOTE — Telephone Encounter (Signed)
Dr. Frederik Pearwsley's office called stating they will be doing some oral surgery using IV anesthesia and need cardiac clearance.  Procedure scheduled for 5/12 at 8:30. Fax clearance to [phone] att: Gearldine BienenstockBrandy.  Advised Dr. Eden EmmsNishan not in office today but will send him the message.

## 2014-03-02 NOTE — Telephone Encounter (Signed)
Surgical clearance faxed today to [phone] Att: Brandy.

## 2014-03-21 ENCOUNTER — Encounter: Payer: Self-pay | Admitting: Cardiovascular Disease

## 2014-03-25 ENCOUNTER — Telehealth: Payer: Self-pay | Admitting: Cardiovascular Disease

## 2014-03-25 NOTE — Telephone Encounter (Signed)
PT  AWARE   CLEARANCE NOTE  ALREADY  SENT    ON 03-02-14  ATTN BRANDY SEE OTHER PHONE  NOTE  WILL  CHECK ON  MON  AT ORAL SURGEON'S   OFFICE WHAT   ELSE  IS NEEDED .Zack Seal/CY

## 2014-03-25 NOTE — Telephone Encounter (Signed)
Patient is having extensive dental procedure and needs medical clearance. Please fax clearance to [phone] ( Dr. Ernest MallickIsley) if any questions please call patient.

## 2014-03-28 NOTE — Telephone Encounter (Signed)
SPOKE WITH  ORAL  SURGEONS  OFFICE   NOTHING  ELSE  IS  REQUIRED  FROM  OUR OFFICE  FOR   PT  TO PROCEED./CY

## 2014-04-20 ENCOUNTER — Ambulatory Visit: Payer: Medicare HMO | Admitting: Cardiovascular Disease

## 2014-05-24 ENCOUNTER — Ambulatory Visit (INDEPENDENT_AMBULATORY_CARE_PROVIDER_SITE_OTHER): Payer: Commercial Managed Care - HMO | Admitting: Cardiovascular Disease

## 2014-05-24 ENCOUNTER — Encounter: Payer: Self-pay | Admitting: Cardiovascular Disease

## 2014-05-24 VITALS — BP 129/80 | HR 78 | Ht 70.0 in | Wt 203.8 lb

## 2014-05-24 DIAGNOSIS — I5022 Chronic systolic (congestive) heart failure: Secondary | ICD-10-CM

## 2014-05-24 DIAGNOSIS — I447 Left bundle-branch block, unspecified: Secondary | ICD-10-CM

## 2014-05-24 DIAGNOSIS — I509 Heart failure, unspecified: Secondary | ICD-10-CM

## 2014-05-24 DIAGNOSIS — I10 Essential (primary) hypertension: Secondary | ICD-10-CM

## 2014-05-24 MED ORDER — SPIRONOLACTONE 12.5 MG HALF TABLET: 12.5000 mg | ORAL_TABLET | Freq: Every day | ORAL | Status: DC

## 2014-05-24 NOTE — Assessment & Plan Note (Signed)
Well controlled.  Continue current medications and low sodium Dash type diet.    

## 2014-05-24 NOTE — Assessment & Plan Note (Signed)
Stable no high grade heart block or syncope

## 2014-05-24 NOTE — Addendum Note (Signed)
Addended by: Nita SellsORSON, Londynn Sonoda L on: 05/24/2014 12:04 PM   Modules accepted: Orders

## 2014-05-24 NOTE — Assessment & Plan Note (Signed)
Add aldactone 12.5mg  daily f/u clinic for close monitoring of K/Cr  [facility]  K 4.1 and Cr 1.08 on 03/21/14

## 2014-05-24 NOTE — Progress Notes (Signed)
Patient ID: [name], female   DOB: 09-Jan-1923, 80 y.o.   MRN: [id number] Kristen LeatherwoodKatherine is seen today for F/U of PAF, LBBB, DCM with EF 20% no CAD by cath in 2009 S/P right hip surgery from fall 2011 She has had some continued right hip pain and is taking hydrocodone for it. Her BP has been better on home readings. Average systolics have been 12-140 mmHg after increasing her Benzipril to 40mg  she has not had any SSCP, palpitations, or syncope. She has vertiginous symptoms with change in position She also has stable mild LE edema.  BP is fine  Hospitalized in December with Flu and sepsis Acute renal azotemia and PAF. [facility]  [facility] week post d/c She is back on both and [name] has rechecked BMET and patient indicates its ok   Still with some edema and dyspnea  Has to take extra lasix frequently Isomnia despite lucentis Chronic balance and arthritis issues     ROS: Denies fever, malais, weight loss, blurry vision, decreased visual acuity, cough, sputum, SOB, hemoptysis, pleuritic pain, palpitaitons, heartburn, abdominal pain, melena, lower extremity edema, claudication, or rash.  All other systems reviewed and negative  General: Affect appropriate Elderly white female  HEENT: normal Neck supple with no adenopathy JVP normal no bruits no thyromegaly Lungs clear with no wheezing and good diaphragmatic motion Heart:  S1/S2 no murmur, no rub, gallop or click PMI normal Abdomen: benighn, BS positve, no tenderness, no AAA no bruit.  No HSM or HJR Distal pulses intact with no bruits No edema Neuro non-focal Skin warm and dry No muscular weakness   Current Outpatient Prescriptions  Medication Sig Dispense Refill  . acetaminophen (TYLENOL) 500 MG tablet Take 500 mg by mouth every 6 (six) hours as needed for pain.      Marland Kitchen. aspirin 81 MG chewable tablet Chew 81 mg by mouth daily.      . carvedilol (COREG) 12.5 MG tablet Take 1 tablet (12.5 mg total) by mouth 2 (two)  times daily with a meal.  180 tablet  2  . feeding supplement, ENSURE COMPLETE, (ENSURE COMPLETE) LIQD Take 237 mLs by mouth daily.  30 Bottle  0  . furosemide (LASIX) 40 MG tablet Take 40 mg by mouth daily.      Marland Kitchen. losartan (COZAAR) 50 MG tablet Take 50 mg by mouth 2 (two) times daily.      . Magnesium Hydroxide (MILK OF MAGNESIA PO) Take by mouth as needed.       . mirtazapine (REMERON) 15 MG tablet Take 15 mg by mouth 2 (two) times daily.       . Multiple Vitamins-Minerals (PRESERVISION AREDS PO) Take 1 tablet by mouth 2 (two) times daily.      Marland Kitchen. omeprazole (PRILOSEC) 20 MG capsule Take 20 mg by mouth daily.      . Ranibizumab (LUCENTIS IO) Inject into the eye. Eye injection into right eye every six weeks      . simvastatin (ZOCOR) 40 MG tablet Take 1 tablet (40 mg total) by mouth at bedtime.  90 tablet  2   No current facility-administered medications for this visit.    Allergies  Fosamax; Penicillins; Sulfonamide derivatives; and Tetanus toxoid  Electrocardiogram:  SR LBBB   Assessment and Plan

## 2014-05-24 NOTE — Addendum Note (Signed)
Addended by: Audrie LiaEARL, Kamarah Bilotta R on: 05/24/2014 12:03 PM   Modules accepted: Orders

## 2014-05-24 NOTE — Patient Instructions (Signed)
Your physician has recommended you make the following change in your medication: Start Spironolactone 12.5 MG   Your physician recommends that you schedule a follow-up appointment in: 3 months with Dr Eden EmmsNishan

## 2014-05-30 ENCOUNTER — Other Ambulatory Visit (INDEPENDENT_AMBULATORY_CARE_PROVIDER_SITE_OTHER): Payer: Commercial Managed Care - HMO

## 2014-05-30 DIAGNOSIS — I509 Heart failure, unspecified: Secondary | ICD-10-CM | POA: Diagnosis not present

## 2014-05-30 DIAGNOSIS — I5022 Chronic systolic (congestive) heart failure: Secondary | ICD-10-CM

## 2014-05-30 LAB — BASIC METABOLIC PANEL
BUN: 18 mg/dL (ref 6–23)
CO2: 30 mEq/L (ref 19–32)
Calcium: 8.8 mg/dL (ref 8.4–10.5)
Chloride: 104 mEq/L (ref 96–112)
Creatinine, Ser: 1.1 mg/dL (ref 0.4–1.2)
GFR: 51.84 mL/min — AB (ref >60.00)
Glucose, Bld: 147 mg/dL — ABNORMAL HIGH (ref 70–99)
Potassium: 3.8 mEq/L (ref 3.5–5.1)
SODIUM: 141 meq/L (ref 135–145)

## 2014-06-08 ENCOUNTER — Other Ambulatory Visit: Payer: Self-pay | Admitting: Cardiovascular Disease

## 2014-06-16 ENCOUNTER — Ambulatory Visit (INDEPENDENT_AMBULATORY_CARE_PROVIDER_SITE_OTHER): Payer: Commercial Managed Care - HMO | Admitting: *Deleted

## 2014-06-16 DIAGNOSIS — E785 Hyperlipidemia, unspecified: Secondary | ICD-10-CM

## 2014-06-16 DIAGNOSIS — I10 Essential (primary) hypertension: Secondary | ICD-10-CM

## 2014-06-16 LAB — BASIC METABOLIC PANEL
BUN: 22 mg/dL (ref 6–23)
CO2: 31 mEq/L (ref 19–32)
Calcium: 9 mg/dL (ref 8.4–10.5)
Chloride: 102 mEq/L (ref 96–112)
Creatinine, Ser: 1.1 mg/dL (ref 0.4–1.2)
GFR: 51.83 mL/min — ABNORMAL LOW (ref >60.00)
Glucose, Bld: 145 mg/dL — ABNORMAL HIGH (ref 70–99)
POTASSIUM: 3.8 meq/L (ref 3.5–5.1)
Sodium: 140 mEq/L (ref 135–145)

## 2014-06-21 ENCOUNTER — Encounter (HOSPITAL_COMMUNITY): Payer: Self-pay | Admitting: Emergency Medicine

## 2014-06-21 ENCOUNTER — Emergency Department (HOSPITAL_COMMUNITY)
Admission: EM | Admit: 2014-06-21 | Discharge: 2014-06-21 | Disposition: A | Discharge status: Home / Self Care | Payer: Medicare HMO | Attending: Emergency Medicine | Admitting: Emergency Medicine

## 2014-06-21 ENCOUNTER — Emergency Department (HOSPITAL_COMMUNITY): Payer: Medicare HMO

## 2014-06-21 DIAGNOSIS — I10 Essential (primary) hypertension: Secondary | ICD-10-CM | POA: Insufficient documentation

## 2014-06-21 DIAGNOSIS — K219 Gastro-esophageal reflux disease without esophagitis: Secondary | ICD-10-CM | POA: Insufficient documentation

## 2014-06-21 DIAGNOSIS — S79919A Unspecified injury of unspecified hip, initial encounter: Secondary | ICD-10-CM | POA: Diagnosis present

## 2014-06-21 DIAGNOSIS — Z880 Allergy status to penicillin: Secondary | ICD-10-CM | POA: Diagnosis not present

## 2014-06-21 DIAGNOSIS — Z8669 Personal history of other diseases of the nervous system and sense organs: Secondary | ICD-10-CM | POA: Insufficient documentation

## 2014-06-21 DIAGNOSIS — Y9389 Activity, other specified: Secondary | ICD-10-CM | POA: Insufficient documentation

## 2014-06-21 DIAGNOSIS — S7010XA Contusion of unspecified thigh, initial encounter: Secondary | ICD-10-CM | POA: Insufficient documentation

## 2014-06-21 DIAGNOSIS — Z7982 Long term (current) use of aspirin: Secondary | ICD-10-CM | POA: Insufficient documentation

## 2014-06-21 DIAGNOSIS — S79929A Unspecified injury of unspecified thigh, initial encounter: Secondary | ICD-10-CM

## 2014-06-21 DIAGNOSIS — E785 Hyperlipidemia, unspecified: Secondary | ICD-10-CM | POA: Diagnosis not present

## 2014-06-21 DIAGNOSIS — E119 Type 2 diabetes mellitus without complications: Secondary | ICD-10-CM | POA: Insufficient documentation

## 2014-06-21 DIAGNOSIS — Y92009 Unspecified place in unspecified non-institutional (private) residence as the place of occurrence of the external cause: Secondary | ICD-10-CM | POA: Insufficient documentation

## 2014-06-21 DIAGNOSIS — I447 Left bundle-branch block, unspecified: Secondary | ICD-10-CM | POA: Diagnosis not present

## 2014-06-21 DIAGNOSIS — Z9889 Other specified postprocedural states: Secondary | ICD-10-CM | POA: Diagnosis not present

## 2014-06-21 DIAGNOSIS — Z79899 Other long term (current) drug therapy: Secondary | ICD-10-CM | POA: Diagnosis not present

## 2014-06-21 DIAGNOSIS — R296 Repeated falls: Secondary | ICD-10-CM | POA: Diagnosis not present

## 2014-06-21 DIAGNOSIS — S7012XA Contusion of left thigh, initial encounter: Secondary | ICD-10-CM

## 2014-06-21 LAB — URINALYSIS, ROUTINE W REFLEX MICROSCOPIC
BILIRUBIN URINE: NEGATIVE
GLUCOSE, UA: NEGATIVE mg/dL
HGB URINE DIPSTICK: NEGATIVE
Ketones, ur: NEGATIVE mg/dL
NITRITE: NEGATIVE
PH: 7.5 (ref 5.0–8.0)
Protein, ur: NEGATIVE mg/dL
Specific Gravity, Urine: 1.014 (ref 1.005–1.030)
Urobilinogen, UA: 0.2 mg/dL (ref 0.0–1.0)

## 2014-06-21 LAB — CBC WITH DIFFERENTIAL/PLATELET
Basophils Absolute: 0 10*3/uL (ref 0.0–0.1)
Basophils Relative: 0 % (ref 0–1)
Eosinophils Absolute: 0.3 10*3/uL (ref 0.0–0.7)
Eosinophils Relative: 3 % (ref 0–5)
HEMATOCRIT: 39.9 % (ref 36.0–46.0)
HEMOGLOBIN: 13.5 g/dL (ref 12.0–15.0)
LYMPHS ABS: 1.5 10*3/uL (ref 0.7–4.0)
Lymphocytes Relative: 12 % (ref 12–46)
MCH: 30.6 pg (ref 26.0–34.0)
MCHC: 33.8 g/dL (ref 30.0–36.0)
MCV: 90.5 fL (ref 78.0–100.0)
MONOS PCT: 8 % (ref 3–12)
Monocytes Absolute: 1 10*3/uL (ref 0.1–1.0)
NEUTROS PCT: 77 % (ref 43–77)
Neutro Abs: 10.2 10*3/uL — ABNORMAL HIGH (ref 1.7–7.7)
Platelets: 193 10*3/uL (ref 150–400)
RBC: 4.41 MIL/uL (ref 3.87–5.11)
RDW: 13.1 % (ref 11.5–15.5)
WBC: 13.1 10*3/uL — ABNORMAL HIGH (ref 4.0–10.5)

## 2014-06-21 LAB — COMPREHENSIVE METABOLIC PANEL
ALK PHOS: 63 U/L (ref 39–117)
ALT: 14 U/L (ref 0–35)
ANION GAP: 14 (ref 5–15)
AST: 16 U/L (ref 0–37)
Albumin: 3.5 g/dL (ref 3.5–5.2)
BILIRUBIN TOTAL: 0.3 mg/dL (ref 0.3–1.2)
BUN: 23 mg/dL (ref 6–23)
CO2: 26 mEq/L (ref 19–32)
Calcium: 10 mg/dL (ref 8.4–10.5)
Chloride: 100 mEq/L (ref 96–112)
Creatinine, Ser: 1.04 mg/dL (ref 0.50–1.10)
GFR calc non Af Amer: 46 mL/min — ABNORMAL LOW (ref >90)
GFR, EST AFRICAN AMERICAN: 54 mL/min — AB (ref >90)
GLUCOSE: 146 mg/dL — AB (ref 70–99)
Potassium: 4.3 mEq/L (ref 3.7–5.3)
Sodium: 140 mEq/L (ref 137–147)
TOTAL PROTEIN: 7.6 g/dL (ref 6.0–8.3)

## 2014-06-21 LAB — I-STAT TROPONIN, ED: Troponin i, poc: 0 ng/mL (ref 0.00–0.08)

## 2014-06-21 LAB — URINE MICROSCOPIC-ADD ON

## 2014-06-21 MED ORDER — HYDROCODONE-ACETAMINOPHEN 5-325 MG PO TABS
1.0000 | ORAL_TABLET | ORAL | Status: DC | PRN
Start: 2014-06-21 — End: 2015-09-18

## 2014-06-21 NOTE — Discharge Instructions (Signed)
Contusion °A contusion is a deep bruise. Contusions happen when an injury causes bleeding under the skin. Signs of bruising include pain, puffiness (swelling), and discolored skin. The contusion may turn blue, purple, or yellow. °HOME CARE  °· Put ice on the injured area. °¨ Put ice in a plastic bag. °¨ Place a towel between your skin and the bag. °¨ Leave the ice on for 15-20 minutes, 03-04 times a day. °· Only take medicine as told by your doctor. °· Rest the injured area. °· If possible, raise (elevate) the injured area to lessen puffiness. °GET HELP RIGHT AWAY IF:  °· You have more bruising or puffiness. °· You have pain that is getting worse. °· Your puffiness or pain is not helped by medicine. °MAKE SURE YOU:  °· Understand these instructions. °· Will watch your condition. °· Will get help right away if you are not doing well or get worse. °Document Released: 04/22/2008 Document Revised: 01/27/2012 Document Reviewed: 09/09/2011 °Exit[employer]® Patient Information ©2015 [employer]. This information is not intended to replace advice given to you by your health care provider. Make sure you discuss any questions you have with your health care provider. ° °

## 2014-06-21 NOTE — ED Notes (Signed)
Pt states she stood up today around 1500 and blacked out and fall. Pt remembers falling states things went black but no LOC. Pt c/o left thigh to hip pain.

## 2014-06-21 NOTE — ED Notes (Signed)
Notified Pt about urine sample. Pt stated she didn't need to go at this time, but will notify when she is able to void

## 2014-06-21 NOTE — ED Provider Notes (Addendum)
CSN: [id number]     Arrival date & time 06/21/14  1548 History   First MD Initiated Contact with Patient 06/21/14 1716     Chief Complaint  Patient presents with  . Fall     ) HPI  Patient presents with a fall at home. She states that she is dizzy almost every time she stands up and has been this way for years. She states that there were spiders in her home yesterday. She taken the couch cushions often had gone to get her accurate. She states she was sitting on her couch. She states she had one for several times to get herself up. When she stood up she felt dizzy and when attempting to sit back down she states that she tried to lean her forward she landed on the cushions on the ground. She hit her leg against the metal frame of the couch. Landed without injury. She has some pain in the left mid thigh. She states she didn't want to come some friends and family encourage her to come be evaluated. No chest pain. No dyspnea. No headache or head injury. Previous bilateral total hip arthroplasties.  Past Medical History  Diagnosis Date  . Cholelithiasis   . Other postprocedural status(V45.89)   . Atrial fibrillation   . Hepatic cyst   . GERD (gastroesophageal reflux disease)   . LBBB (left bundle branch block)   . Diabetes mellitus   . Hyperlipidemia   . Hypertension   . Ischemic cardiomyopathy   . Dizziness     with some gait disability  . Peripheral edema   . Constipation   . Macular degeneration    Past Surgical History  Procedure Laterality Date  . Wrist surgery Bilateral     wrist fractures  . Dilation and curettage    . Hip arthroplasty  05/05/2012    Procedure: ARTHROPLASTY BIPOLAR HIP;  Surgeon: Shelda PalMatthew D Olin, MD;  Location: [facility];  Service: Orthopedics;  Laterality: Left;  . Cholecystectomy    . Ankle surgery Bilateral     fractures  . Tonsillectomy     Family History  Problem Relation Age of Onset  . Heart failure Brother     died age 78  . Leukemia Mother   .  Arthritis Father   . Arthritis Brother    History  Substance Use Topics  . Smoking status: Passive Smoke Exposure - Never Smoker  . 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 Alcohol Use: No   OB History   Grav Para Term Preterm Abortions TAB SAB Ect Mult Living                 Review of Systems  Constitutional: Negative for fever, chills, diaphoresis, appetite change and fatigue.  HENT: Negative for mouth sores, sore throat and trouble swallowing.   Eyes: Negative for visual disturbance.  Respiratory: Negative for cough, chest tightness, shortness of breath and wheezing.   Cardiovascular: Negative for chest pain.  Gastrointestinal: Negative for nausea, vomiting, abdominal pain, diarrhea and abdominal distention.  Endocrine: Negative for polydipsia, polyphagia and polyuria.  Genitourinary: Negative for dysuria, frequency and hematuria.  Musculoskeletal: Positive for arthralgias and myalgias. Negative for gait problem.  Skin: Negative for color change, pallor and rash.  Neurological: Positive for dizziness. Negative for syncope, light-headedness and headaches.  Hematological: Does not bruise/bleed easily.  Psychiatric/Behavioral: Negative for behavioral problems and confusion.      Allergies  Fosamax; Penicillins; Sulfonamide derivatives; and Tetanus toxoid  Home Medications   Prior to Admission medications   Medication Sig Start Date End Date Taking? Authorizing Provider  acetaminophen (TYLENOL) 500 MG tablet Take 500 mg by mouth every 6 (six) hours as needed for pain.   Yes Historical Provider, MD  aspirin 81 MG chewable tablet Chew 81 mg by mouth daily.   Yes Historical Provider, MD  carvedilol (COREG) 12.5 MG tablet Take 1 tablet (12.5 mg total) by mouth 2 (two) times daily with a meal. 11/02/13  Yes Wendall Stade, MD  furosemide (LASIX) 40 MG tablet Take 40 mg by mouth daily. 11/09/13  Yes Renae Fickle, MD    losartan (COZAAR) 50 MG tablet Take 50 mg by mouth 2 (two) times daily.   Yes Historical Provider, MD  magnesium hydroxide (MILK OF MAGNESIA) 400 MG/5ML suspension Take 30 mLs by mouth daily as needed for mild constipation (constipation).   Yes Historical Provider, MD  mirtazapine (REMERON) 15 MG tablet Take 15 mg by mouth 2 (two) times daily.    Yes Historical Provider, MD  Multiple Vitamins-Minerals (PRESERVISION AREDS PO) Take 1 tablet by mouth 2 (two) times daily.   Yes Historical Provider, MD  omeprazole (PRILOSEC) 20 MG capsule Take 20 mg by mouth daily.   Yes Historical Provider, MD  simvastatin (ZOCOR) 40 MG tablet Take 40 mg by mouth daily.   Yes Historical Provider, MD  spironolactone (ALDACTONE) 12.5 mg TABS tablet Take 0.5 tablets (12.5 mg total) by mouth daily. 05/24/14  Yes Wendall Stade, MD  HYDROcodone-acetaminophen (NORCO/VICODIN) 5-325 MG per tablet Take 1 tablet by mouth every 4 (four) hours as needed. 06/21/14   Rolland Porter, MD  Ranibizumab (LUCENTIS IO) Inject into the eye. Eye injection into right eye every six weeks    Historical Provider, MD   BP 140/82  Pulse 73  Temp(Src) 98.3 F (36.8 C) (Oral)  Resp 20  SpO2 100% Physical Exam  Constitutional: She is oriented to person, place, and time. She appears well-developed and well-nourished. No distress.  HENT:  Head: Normocephalic.  Eyes: Conjunctivae are normal. Pupils are equal, round, and reactive to light. No scleral icterus.  Neck: Normal range of motion. Neck supple. No thyromegaly present.  Cardiovascular: Normal rate and regular rhythm.  Exam reveals no gallop and no friction rub.   No murmur heard. Pulmonary/Chest: Effort normal and breath sounds normal. No respiratory distress. She has no wheezes. She has no rales.  Abdominal: Soft. Bowel sounds are normal. She exhibits no distension. There is no tenderness. There is no rebound.  Musculoskeletal: Normal range of motion.       Legs: Neurological: She is alert  and oriented to person, place, and time.  Skin: Skin is warm and dry. No rash noted.  Psychiatric: She has a normal mood and affect. Her behavior is normal.    ED Course  Procedures (including critical care time) Labs Review Labs Reviewed  CBC WITH DIFFERENTIAL - Abnormal; Notable for the following:    WBC 13.1 (*)    Neutro Abs 10.2 (*)    All other components within normal limits  COMPREHENSIVE METABOLIC PANEL - Abnormal; Notable for the following:    Glucose, Bld 146 (*)    GFR calc non Af Amer 46 (*)    GFR calc Af Amer 54 (*)    All other components within normal limits  URINALYSIS, ROUTINE W REFLEX MICROSCOPIC  I-STAT TROPOININ, ED    Imaging Review Dg Chest  1 View  06/21/2014   CLINICAL DATA:  Fall.  Left hip pain.  EXAM: CHEST - 1 VIEW  COMPARISON:  11/07/2013  FINDINGS: The heart size and mediastinal contours are within normal limits. Both lungs are clear. The visualized skeletal structures are unremarkable.  IMPRESSION: No active disease.   Electronically Signed   By: Myles Rosenthal M.D.   On: 06/21/2014 18:32   Dg Pelvis 1-2 Views  06/21/2014   CLINICAL DATA:  Fall, left hip pain  EXAM: PELVIS - 1-2 VIEW  COMPARISON:  Radiograph 05/05/2012  FINDINGS: Bilateral total hip arthroplasty noted. Hips are located. No evidence of acute pelvic fracture or sacral fracture.  IMPRESSION: Bilateral hip prosthetics. No evidence of pelvic fracture hip dislocation.   Electronically Signed   By: Genevive Bi M.D.   On: 06/21/2014 18:31   Dg Hip Complete Left  06/21/2014   CLINICAL DATA:  Fall, left hip pain  EXAM: LEFT HIP - COMPLETE 2+ VIEW  COMPARISON:  Pelvis 05/05/2012  FINDINGS: The left hip is located. Left hip prosthetic noted. No evidence of acute fracture.  IMPRESSION: No evidence of acute left hip fracture or dislocation. Hip arthroplasty noted.   Electronically Signed   By: Genevive Bi M.D.   On: 06/21/2014 18:34   Dg Femur Left  06/21/2014   CLINICAL DATA:  Post fall, now  with left thigh pain  EXAM: LEFT FEMUR - 2 VIEW  COMPARISON:  Left hip radiographs -earlier same day  FINDINGS: This examination is interpreted in conjunction with left hip radiographs performed earlier same day.  No fracture or dislocation. Limited visualization of the adjacent knee is normal given obliquity. There is a small ossicle adjacent to the superior pole of the patella, likely the sequela of remote avulsive injury. Regional soft tissues appear normal.  IMPRESSION: No acute findings.   Electronically Signed   By: Simonne Come M.D.   On: 06/21/2014 20:09     EKG Interpretation None      MDM   Final diagnoses:  Thigh contusion, left, initial encounter    Pt remains stable here.  Moves leg with minimal sx.  VSS. Pt not concerned about fall, states that she is dizzy with standing every day.  Ambulates here with tech and a rolling walker without complaint.  Will dc with instructions to return with ANY difficulties at home.    Rolland Porter, MD 06/21/14 1610  Rolland Porter, MD 06/21/14 2101

## 2014-06-27 ENCOUNTER — Other Ambulatory Visit: Payer: Self-pay | Admitting: *Deleted

## 2014-06-27 MED ORDER — LOSARTAN POTASSIUM 50 MG PO TABS: 50.0000 mg | ORAL_TABLET | Freq: Two times a day (BID) | ORAL | Status: DC

## 2014-08-15 ENCOUNTER — Other Ambulatory Visit: Payer: Self-pay | Admitting: Cardiovascular Disease

## 2014-08-23 ENCOUNTER — Ambulatory Visit (INDEPENDENT_AMBULATORY_CARE_PROVIDER_SITE_OTHER): Payer: Commercial Managed Care - HMO | Admitting: Cardiovascular Disease

## 2014-08-23 VITALS — BP 132/58 | HR 68 | Ht 70.0 in | Wt 201.4 lb

## 2014-08-23 DIAGNOSIS — I5022 Chronic systolic (congestive) heart failure: Secondary | ICD-10-CM

## 2014-08-23 DIAGNOSIS — I10 Essential (primary) hypertension: Secondary | ICD-10-CM

## 2014-08-23 DIAGNOSIS — I480 Paroxysmal atrial fibrillation: Secondary | ICD-10-CM

## 2014-08-23 DIAGNOSIS — I447 Left bundle-branch block, unspecified: Secondary | ICD-10-CM

## 2014-08-23 NOTE — Assessment & Plan Note (Signed)
Stable no high grade heart block given age check ECG q 6 months No syncope

## 2014-08-23 NOTE — Patient Instructions (Signed)
Your physician wants you to follow-up in:  6 MONTHS WITH DR NISHAN  You will receive a reminder letter in the mail two months in advance. If you don't receive a letter, please call our office to schedule the follow-up appointment. Your physician recommends that you continue on your current medications as directed. Please refer to the Current Medication list given to you today. 

## 2014-08-23 NOTE — Assessment & Plan Note (Signed)
Maint NSR with no palpitations continue current meds

## 2014-08-23 NOTE — Assessment & Plan Note (Signed)
Improved with aldactone  Euvolemic functional class 2  More limited by arthritis at this point

## 2014-08-23 NOTE — Progress Notes (Signed)
Patient ID: [name], female   DOB: 08-24-1923, 80 y.o.   MRN: [id number] [name] is seen today for F/U of PAF, LBBB, DCM with EF 20% no CAD by cath in 2009 S/P right hip surgery from fall 2011 She has had some continued right hip pain and is taking hydrocodone for it. Her BP has been better on home readings. Average systolics have been 12-140 mmHg after increasing her Benzipril to 40mg  she has not had any SSCP, palpitations, or syncope. She has vertiginous symptoms with change in position She also has stable mild LE edema.  BP is fine  Hospitalized in December with Flu and sepsis Acute renal azotemia and PAF. Lasix and ARB held [facility] to SNF for  A week post d/c She is back on both and Dr Clelia Croft has rechecked BMET and patient indicates its ok  Still with some edema and dyspnea Has to take extra lasix frequently  Isomnia despite lucentis  Chronic balance and arthritis issues   May be going to [facility] in Spring      ROS: Denies fever, malais, weight loss, blurry vision, decreased visual acuity, cough, sputum, SOB, hemoptysis, pleuritic pain, palpitaitons, heartburn, abdominal pain, melena, lower extremity edema, claudication, or rash.  All other systems reviewed and negative  General: Affect appropriate Healthy:  appears stated age HEENT: normal Neck supple with no adenopathy JVP normal no bruits no thyromegaly Lungs clear with no wheezing and good diaphragmatic motion Heart:  S1/S2 SEM  murmur, no rub, gallop or click PMI normal Abdomen: benighn, BS positve, no tenderness, no AAA no bruit.  No HSM or HJR Distal pulses intact with no bruits No edema Neuro non-focal Skin warm and dry Weak LEls with walker    Current Outpatient Prescriptions  Medication Sig Dispense Refill  . acetaminophen (TYLENOL) 500 MG tablet Take 500 mg by mouth every 6 (six) hours as needed for pain.      Marland Kitchen aspirin 81 MG chewable tablet Chew 81 mg by mouth daily.      . carvedilol (COREG) 12.5  MG tablet TAKE 1 TABLET TWICE DAILY  WITH  A  MEAL  180 tablet  0  . furosemide (LASIX) 40 MG tablet Take 40 mg by mouth daily.      Marland Kitchen HYDROcodone-acetaminophen (NORCO/VICODIN) 5-325 MG per tablet Take 1 tablet by mouth every 4 (four) hours as needed.  10 tablet  0  . losartan (COZAAR) 50 MG tablet Take 1 tablet (50 mg total) by mouth 2 (two) times daily.  180 tablet  0  . magnesium hydroxide (MILK OF MAGNESIA) 400 MG/5ML suspension Take 30 mLs by mouth daily as needed for mild constipation (constipation).      . mirtazapine (REMERON) 15 MG tablet Take 15 mg by mouth 2 (two) times daily.       . Multiple Vitamins-Minerals (PRESERVISION AREDS PO) Take 1 tablet by mouth 2 (two) times daily.      Marland Kitchen omeprazole (PRILOSEC) 20 MG capsule Take 20 mg by mouth daily.      . Ranibizumab (LUCENTIS IO) Inject into the eye. Eye injection into right eye every six weeks      . simvastatin (ZOCOR) 40 MG tablet Take 40 mg by mouth daily.      Marland Kitchen spironolactone (ALDACTONE) 12.5 mg TABS tablet Take 0.5 tablets (12.5 mg total) by mouth daily.  15 tablet  3   No current facility-administered medications for this visit.    Allergies  Fosamax; Penicillins; Sulfonamide derivatives; and  Tetanus toxoid  Electrocardiogram:  SR LBBB PR 236   Assessment and Plan

## 2014-08-23 NOTE — Assessment & Plan Note (Signed)
Well controlled.  Continue current medications and low sodium Dash type diet.    

## 2014-10-07 ENCOUNTER — Encounter: Payer: Self-pay | Admitting: Cardiovascular Disease

## 2014-11-18 ENCOUNTER — Other Ambulatory Visit: Payer: Self-pay | Admitting: Cardiovascular Disease

## 2014-12-08 DIAGNOSIS — H35051 Retinal neovascularization, unspecified, right eye: Secondary | ICD-10-CM | POA: Diagnosis not present

## 2014-12-08 DIAGNOSIS — H3532 Exudative age-related macular degeneration: Secondary | ICD-10-CM | POA: Diagnosis not present

## 2015-01-27 ENCOUNTER — Telehealth: Payer: Self-pay | Admitting: Cardiovascular Disease

## 2015-01-27 NOTE — Telephone Encounter (Signed)
New problem   Pt want Dr Eden EmmsNishan to write her a letter to insurance company about her health condition to support her in going to an assist living facility. Please advise.

## 2015-01-30 NOTE — Telephone Encounter (Signed)
WILL FORWARD  TO  DR Eden EmmsNISHAN FOR  REVIEW . TO  SEE  IF  PT  WOULD  QUALIFY  FROM HEART  STANDPOINT  FOR  ASSISTED LIVING .Zack Seal/CY

## 2015-01-30 NOTE — Telephone Encounter (Signed)
Ok to write letter saying due to age , congestive heart failure and heart arrhythmia She should be in assisted living

## 2015-02-01 ENCOUNTER — Encounter: Payer: Self-pay | Admitting: *Deleted

## 2015-02-01 NOTE — Telephone Encounter (Signed)
LETTER  TYPED  PT  AWARE  WILL MAIL  LETTER  TO PT  AT  PT'S  REQUEST .Zack Seal/CY

## 2015-02-18 ENCOUNTER — Other Ambulatory Visit: Payer: Self-pay | Admitting: Cardiovascular Disease

## 2015-02-19 NOTE — Progress Notes (Signed)
Patient ID: [name], female   DOB: 23-Oct-1923, 81 y.o.   MRN: [id number]   Kristen LeatherwoodKatherine is seen today for F/U of PAF, LBBB, DCM with EF 20% no CAD by cath in 2009 S/P right hip surgery from fall 2011 She has had some continued right hip pain and is taking hydrocodone for it. Her BP has been better on home readings. Average systolics have been 12-140 mmHg after increasing her Benzipril to 40mg  she has not had any SSCP, palpitations, or syncope. She has vertiginous symptoms with change in position She also has stable mild LE edema.  BP is fine  Hospitalized in December with Flu and sepsis Acute renal azotemia and PAF. [facility]  [facility] week post d/c She is back on both and [name] has rechecked BMET and patient indicates its ok   Still with some edema and dyspnea Has to take extra lasix frequently  Isomnia despite lucentis  Chronic balance and arthritis issues   [facility]    ROS: Denies fever, malais, weight loss, blurry vision, decreased visual acuity, cough, sputum, SOB, hemoptysis, pleuritic pain, palpitaitons, heartburn, abdominal pain, melena, lower extremity edema, claudication, or rash.  All other systems reviewed and negative  General: Affect appropriate Healthy:  appears stated age HEENT: normal Neck supple with no adenopathy JVP normal no bruits no thyromegaly Lungs clear with no wheezing and good diaphragmatic motion Heart:  S1/S2 SEM  murmur, no rub, gallop or click PMI normal Abdomen: benighn, BS positve, no tenderness, no AAA no bruit.  No HSM or HJR Distal pulses intact with no bruits No edema Neuro non-focal Skin warm and dry Weak LEls with walker    Current Outpatient Prescriptions  Medication Sig Dispense Refill  . acetaminophen (TYLENOL) 500 MG tablet Take 500 mg by mouth every 6 (six) hours as needed for pain.    Marland Kitchen. aspirin 81 MG chewable tablet Chew 81 mg by mouth daily.    . carvedilol (COREG) 12.5 MG  tablet TAKE 1 TABLET TWICE DAILY  WITH  A  MEAL 180 tablet 0  . furosemide (LASIX) 40 MG tablet Take 40 mg by mouth daily.    Marland Kitchen. HYDROcodone-acetaminophen (NORCO/VICODIN) 5-325 MG per tablet Take 1 tablet by mouth every 4 (four) hours as needed. 10 tablet 0  . losartan (COZAAR) 50 MG tablet TAKE 1 TABLET TWICE DAILY 180 tablet 0  . magnesium hydroxide (MILK OF MAGNESIA) 400 MG/5ML suspension Take 30 mLs by mouth daily as needed for mild constipation (constipation).    . mirtazapine (REMERON) 15 MG tablet Take 15 mg by mouth 2 (two) times daily.     . Multiple Vitamins-Minerals (PRESERVISION AREDS PO) Take 1 tablet by mouth 2 (two) times daily.    Marland Kitchen. omeprazole (PRILOSEC) 20 MG capsule Take 20 mg by mouth daily.    . Ranibizumab (LUCENTIS IO) Inject into the eye. Eye injection into right eye every six weeks    . simvastatin (ZOCOR) 40 MG tablet Take 40 mg by mouth daily.    Marland Kitchen. spironolactone (ALDACTONE) 12.5 mg TABS tablet Take 0.5 tablets (12.5 mg total) by mouth daily. 15 tablet 3   No current facility-administered medications for this visit.    Allergies  Fosamax; Penicillins; Sulfonamide derivatives; and Tetanus toxoid  Electrocardiogram:  06/22/14  SR LBBB PR 236   Assessment and Plan DCM:  F/u echo on good medication lungs clear  euvolemic  PAF:  maint NSR  No pal;pitations no anticoagulation due to falls LBBB:  Stable chronic no high grade AV block Chol:  On statin

## 2015-02-20 ENCOUNTER — Ambulatory Visit (INDEPENDENT_AMBULATORY_CARE_PROVIDER_SITE_OTHER): Payer: Commercial Managed Care - HMO | Admitting: Cardiovascular Disease

## 2015-02-20 ENCOUNTER — Encounter: Payer: Self-pay | Admitting: Cardiovascular Disease

## 2015-02-20 VITALS — BP 102/60 | HR 68 | Ht 70.0 in | Wt 198.8 lb

## 2015-02-20 DIAGNOSIS — I420 Dilated cardiomyopathy: Secondary | ICD-10-CM

## 2015-02-20 NOTE — Patient Instructions (Signed)
Your physician wants you to follow-up in: 6 MONTHS WITH  DR Eden EmmsNISHAN minder letter in the mail two months in advance. If you don't receive a letter, please call our office to schedule the follow-up appointment. Your physician recommends that you continue on your current medications as directed. Please refer to the Current Medication list given to you today.  Your physician has requested that you have an echocardiogram. Echocardiography is a painless test that uses sound waves to create images of your heart. It provides your doctor with information about the size and shape of your heart and how well your heart's chambers and valves are working. This procedure takes approximately one hour. There are no restrictions for this procedure.

## 2015-02-23 DIAGNOSIS — H3532 Exudative age-related macular degeneration: Secondary | ICD-10-CM | POA: Diagnosis not present

## 2015-02-27 DIAGNOSIS — H3531 Nonexudative age-related macular degeneration: Secondary | ICD-10-CM | POA: Diagnosis not present

## 2015-03-02 ENCOUNTER — Ambulatory Visit (HOSPITAL_COMMUNITY): Payer: Commercial Managed Care - HMO | Attending: Cardiology

## 2015-03-02 ENCOUNTER — Other Ambulatory Visit (HOSPITAL_COMMUNITY): Payer: Self-pay | Admitting: Cardiovascular Disease

## 2015-03-02 DIAGNOSIS — I420 Dilated cardiomyopathy: Secondary | ICD-10-CM | POA: Insufficient documentation

## 2015-03-02 DIAGNOSIS — I429 Cardiomyopathy, unspecified: Secondary | ICD-10-CM

## 2015-03-02 NOTE — Progress Notes (Signed)
2D Echo completed. 03/02/2015 

## 2015-03-06 ENCOUNTER — Telehealth: Payer: Self-pay | Admitting: Cardiovascular Disease

## 2015-03-06 NOTE — Telephone Encounter (Signed)
New message ° ° ° ° ° ° ° °Pt calling for echo results °

## 2015-03-06 NOTE — Telephone Encounter (Signed)
PT AWARE OF ECHO RESULTS./CY 

## 2015-03-08 ENCOUNTER — Other Ambulatory Visit: Payer: Self-pay | Admitting: Cardiovascular Disease

## 2015-04-07 DIAGNOSIS — M1990 Unspecified osteoarthritis, unspecified site: Secondary | ICD-10-CM | POA: Diagnosis not present

## 2015-04-07 DIAGNOSIS — N183 Chronic kidney disease, stage 3 (moderate): Secondary | ICD-10-CM | POA: Diagnosis not present

## 2015-04-07 DIAGNOSIS — E1142 Type 2 diabetes mellitus with diabetic polyneuropathy: Secondary | ICD-10-CM | POA: Diagnosis not present

## 2015-04-07 DIAGNOSIS — I429 Cardiomyopathy, unspecified: Secondary | ICD-10-CM | POA: Diagnosis not present

## 2015-04-07 DIAGNOSIS — H3530 Unspecified macular degeneration: Secondary | ICD-10-CM | POA: Diagnosis not present

## 2015-04-07 DIAGNOSIS — E782 Mixed hyperlipidemia: Secondary | ICD-10-CM | POA: Diagnosis not present

## 2015-04-07 DIAGNOSIS — I130 Hypertensive heart and chronic kidney disease with heart failure and stage 1 through stage 4 chronic kidney disease, or unspecified chronic kidney disease: Secondary | ICD-10-CM | POA: Diagnosis not present

## 2015-04-07 DIAGNOSIS — I5022 Chronic systolic (congestive) heart failure: Secondary | ICD-10-CM | POA: Diagnosis not present

## 2015-04-18 DIAGNOSIS — R1313 Dysphagia, pharyngeal phase: Secondary | ICD-10-CM | POA: Diagnosis not present

## 2015-04-18 DIAGNOSIS — R490 Dysphonia: Secondary | ICD-10-CM | POA: Diagnosis not present

## 2015-05-25 DIAGNOSIS — M545 Low back pain: Secondary | ICD-10-CM | POA: Diagnosis not present

## 2015-05-25 DIAGNOSIS — R300 Dysuria: Secondary | ICD-10-CM | POA: Diagnosis not present

## 2015-05-25 DIAGNOSIS — F411 Generalized anxiety disorder: Secondary | ICD-10-CM | POA: Diagnosis not present

## 2015-06-01 DIAGNOSIS — H3531 Nonexudative age-related macular degeneration: Secondary | ICD-10-CM | POA: Diagnosis not present

## 2015-06-01 DIAGNOSIS — H3532 Exudative age-related macular degeneration: Secondary | ICD-10-CM | POA: Diagnosis not present

## 2015-06-01 DIAGNOSIS — E119 Type 2 diabetes mellitus without complications: Secondary | ICD-10-CM | POA: Diagnosis not present

## 2015-07-20 ENCOUNTER — Other Ambulatory Visit: Payer: Self-pay | Admitting: Cardiovascular Disease

## 2015-09-14 NOTE — Progress Notes (Signed)
Patient ID: [name], female   DOB: December 29, 1922, 81 y.o.   MRN: [id number]   Camrin is seen today for F/U of PAF, LBBB, DCM with EF 20% no CAD by cath in 2009 S/P right hip surgery from fall 2011 She has had some continued right hip pain and is taking hydrocodone for it. Her BP has been better on home readings. Average systolics have been 12-140 mmHg after increasing her Benzipril to  she has not had any SSCP, palpitations, or syncope. She has vertiginous symptoms with change in position She also has stable mild LE edema.  BP is fine  Hospitalized in December with Flu and sepsis Acute renal azotemia and PAF. Lasix and ARB held [facility] to SNF for  A week post d/c She is back on both and Dr Clelia Croft has rechecked BMET and patient indicates its ok   Still with some edema and dyspnea Has to take extra lasix frequently  Isomnia despite lucentis  Chronic balance and arthritis issues   May be going to [facility] in [place]   Last echo 02/2015 reviewed  EF 50-55%   Very emotional about insurance issues with long term care at [place] Has another review of benefits in December  ROS: Denies fever, malais, weight loss, blurry vision, decreased visual acuity, cough, sputum, SOB, hemoptysis, pleuritic pain, palpitaitons, heartburn, abdominal pain, melena, lower extremity edema, claudication, or rash.  All other systems reviewed and negative  General: Affect appropriate Healthy:  appears stated age HEENT: normal Neck supple with no adenopathy JVP normal no bruits no thyromegaly Lungs clear with no wheezing and good diaphragmatic motion Heart:  S1/S2 SEM  murmur, no rub, gallop or click PMI normal Abdomen: benighn, BS positve, no tenderness, no AAA no bruit.  No HSM or HJR Distal pulses intact with no bruits No edema Neuro non-focal Skin warm and dry Weak LEls with walker    Current Outpatient Prescriptions  Medication Sig Dispense Refill  . acetaminophen (TYLENOL) 500 MG tablet  Take 500 mg by mouth every 6 (six) hours as needed for pain.    Marland Kitchen aspirin 81 MG chewable tablet Chew 81 mg by mouth daily.    . carvedilol (COREG) 12.5 MG tablet Take 1 tablet (12.5 mg total) by mouth 2 (two) times daily with a meal. 180 tablet 0  . diazepam (VALIUM) 5 MG tablet Take 5-10 mg by mouth every 6 (six) hours as needed for anxiety or sedation.    . furosemide (LASIX) 40 MG tablet Take 40 mg by mouth daily.    Marland Kitchen HYDROcodone-acetaminophen (NORCO) 5-325 MG tablet Take 1 tablet by mouth every 6 (six) hours as needed. For pain    . losartan (COZAAR) 50 MG tablet Take 1 tablet (50 mg total) by mouth 2 (two) times daily. 180 tablet 0  . magnesium hydroxide (MILK OF MAGNESIA) 400 MG/5ML suspension Take 30 mLs by mouth daily as needed for mild constipation (constipation).    . mirtazapine (REMERON) 15 MG tablet Take 15 mg by mouth daily.     . Multiple Vitamins-Minerals (PRESERVISION AREDS PO) Take 1 tablet by mouth 2 (two) times daily.    Marland Kitchen omeprazole (PRILOSEC) 20 MG capsule Take 20 mg by mouth daily.    . simvastatin (ZOCOR) 40 MG tablet Take 40 mg by mouth daily.    Marland Kitchen spironolactone (ALDACTONE) 25 MG tablet Take 0.5 tablets (12.5 mg total) by mouth daily. 45 tablet 0   No current facility-administered medications for this visit.  Allergies  Fosamax; Penicillins; Sulfonamide derivatives; and Tetanus toxoid  Electrocardiogram:  06/22/14  SR LBBB PR 236   Assessment and Plan DCM:  Euvolemic echo 02/2015 EF 50-55% continue current medicines  PAF:  maint NSR  No pal;pitations no anticoagulation due to falls LBBB:  Stable chronic no high grade AV block Chol:  On statin  Depression:  Crying on valium f/u primary   F/U with me in 6 months

## 2015-09-18 ENCOUNTER — Ambulatory Visit (INDEPENDENT_AMBULATORY_CARE_PROVIDER_SITE_OTHER): Payer: Commercial Managed Care - HMO | Admitting: Cardiovascular Disease

## 2015-09-18 ENCOUNTER — Encounter: Payer: Self-pay | Admitting: Cardiovascular Disease

## 2015-09-18 VITALS — BP 124/66 | HR 72 | Ht 70.0 in | Wt 201.8 lb

## 2015-09-18 DIAGNOSIS — I10 Essential (primary) hypertension: Secondary | ICD-10-CM

## 2015-09-18 DIAGNOSIS — I420 Dilated cardiomyopathy: Secondary | ICD-10-CM

## 2015-09-18 NOTE — Patient Instructions (Addendum)

## 2015-09-26 ENCOUNTER — Other Ambulatory Visit: Payer: Self-pay | Admitting: Cardiovascular Disease

## 2015-09-29 ENCOUNTER — Other Ambulatory Visit: Payer: Self-pay | Admitting: *Deleted

## 2015-09-29 MED ORDER — SIMVASTATIN 40 MG PO TABS: 40.0000 mg | ORAL_TABLET | Freq: Every day | ORAL | Status: DC

## 2015-09-29 MED ORDER — FUROSEMIDE 40 MG PO TABS: 40.0000 mg | ORAL_TABLET | Freq: Every day | ORAL | Status: DC

## 2015-10-04 ENCOUNTER — Other Ambulatory Visit: Payer: Self-pay | Admitting: *Deleted

## 2015-10-04 MED ORDER — FUROSEMIDE 40 MG PO TABS: 40.0000 mg | ORAL_TABLET | Freq: Every day | ORAL | Status: DC

## 2015-10-04 MED ORDER — SIMVASTATIN 40 MG PO TABS: 40.0000 mg | ORAL_TABLET | Freq: Every day | ORAL | Status: DC

## 2015-11-08 DIAGNOSIS — I130 Hypertensive heart and chronic kidney disease with heart failure and stage 1 through stage 4 chronic kidney disease, or unspecified chronic kidney disease: Secondary | ICD-10-CM | POA: Diagnosis not present

## 2015-11-08 DIAGNOSIS — E782 Mixed hyperlipidemia: Secondary | ICD-10-CM | POA: Diagnosis not present

## 2015-11-08 DIAGNOSIS — F322 Major depressive disorder, single episode, severe without psychotic features: Secondary | ICD-10-CM | POA: Diagnosis not present

## 2015-11-08 DIAGNOSIS — M810 Age-related osteoporosis without current pathological fracture: Secondary | ICD-10-CM | POA: Diagnosis not present

## 2015-11-08 DIAGNOSIS — E1142 Type 2 diabetes mellitus with diabetic polyneuropathy: Secondary | ICD-10-CM | POA: Diagnosis not present

## 2015-11-08 DIAGNOSIS — Z0000 Encounter for general adult medical examination without abnormal findings: Secondary | ICD-10-CM | POA: Diagnosis not present

## 2015-11-08 DIAGNOSIS — I5022 Chronic systolic (congestive) heart failure: Secondary | ICD-10-CM | POA: Diagnosis not present

## 2015-11-08 DIAGNOSIS — N183 Chronic kidney disease, stage 3 (moderate): Secondary | ICD-10-CM | POA: Diagnosis not present

## 2015-11-15 DIAGNOSIS — E785 Hyperlipidemia, unspecified: Secondary | ICD-10-CM | POA: Diagnosis not present

## 2015-11-15 DIAGNOSIS — N189 Chronic kidney disease, unspecified: Secondary | ICD-10-CM | POA: Diagnosis not present

## 2015-11-15 DIAGNOSIS — I509 Heart failure, unspecified: Secondary | ICD-10-CM | POA: Diagnosis not present

## 2015-11-15 DIAGNOSIS — E1122 Type 2 diabetes mellitus with diabetic chronic kidney disease: Secondary | ICD-10-CM | POA: Diagnosis not present

## 2015-11-15 DIAGNOSIS — R531 Weakness: Secondary | ICD-10-CM | POA: Diagnosis not present

## 2015-11-15 DIAGNOSIS — I129 Hypertensive chronic kidney disease with stage 1 through stage 4 chronic kidney disease, or unspecified chronic kidney disease: Secondary | ICD-10-CM | POA: Diagnosis not present

## 2015-11-17 DIAGNOSIS — I509 Heart failure, unspecified: Secondary | ICD-10-CM | POA: Diagnosis not present

## 2015-11-17 DIAGNOSIS — E785 Hyperlipidemia, unspecified: Secondary | ICD-10-CM | POA: Diagnosis not present

## 2015-11-17 DIAGNOSIS — E1122 Type 2 diabetes mellitus with diabetic chronic kidney disease: Secondary | ICD-10-CM | POA: Diagnosis not present

## 2015-11-17 DIAGNOSIS — R531 Weakness: Secondary | ICD-10-CM | POA: Diagnosis not present

## 2015-11-17 DIAGNOSIS — I129 Hypertensive chronic kidney disease with stage 1 through stage 4 chronic kidney disease, or unspecified chronic kidney disease: Secondary | ICD-10-CM | POA: Diagnosis not present

## 2015-11-17 DIAGNOSIS — N189 Chronic kidney disease, unspecified: Secondary | ICD-10-CM | POA: Diagnosis not present

## 2015-11-20 DIAGNOSIS — I509 Heart failure, unspecified: Secondary | ICD-10-CM | POA: Diagnosis not present

## 2015-11-20 DIAGNOSIS — N189 Chronic kidney disease, unspecified: Secondary | ICD-10-CM | POA: Diagnosis not present

## 2015-11-20 DIAGNOSIS — I129 Hypertensive chronic kidney disease with stage 1 through stage 4 chronic kidney disease, or unspecified chronic kidney disease: Secondary | ICD-10-CM | POA: Diagnosis not present

## 2015-11-20 DIAGNOSIS — R531 Weakness: Secondary | ICD-10-CM | POA: Diagnosis not present

## 2015-11-20 DIAGNOSIS — E1122 Type 2 diabetes mellitus with diabetic chronic kidney disease: Secondary | ICD-10-CM | POA: Diagnosis not present

## 2015-11-20 DIAGNOSIS — E785 Hyperlipidemia, unspecified: Secondary | ICD-10-CM | POA: Diagnosis not present

## 2015-11-22 DIAGNOSIS — I129 Hypertensive chronic kidney disease with stage 1 through stage 4 chronic kidney disease, or unspecified chronic kidney disease: Secondary | ICD-10-CM | POA: Diagnosis not present

## 2015-11-22 DIAGNOSIS — R531 Weakness: Secondary | ICD-10-CM | POA: Diagnosis not present

## 2015-11-22 DIAGNOSIS — E1122 Type 2 diabetes mellitus with diabetic chronic kidney disease: Secondary | ICD-10-CM | POA: Diagnosis not present

## 2015-11-22 DIAGNOSIS — E785 Hyperlipidemia, unspecified: Secondary | ICD-10-CM | POA: Diagnosis not present

## 2015-11-22 DIAGNOSIS — N189 Chronic kidney disease, unspecified: Secondary | ICD-10-CM | POA: Diagnosis not present

## 2015-11-22 DIAGNOSIS — I509 Heart failure, unspecified: Secondary | ICD-10-CM | POA: Diagnosis not present

## 2015-11-23 DIAGNOSIS — E785 Hyperlipidemia, unspecified: Secondary | ICD-10-CM | POA: Diagnosis not present

## 2015-11-23 DIAGNOSIS — I129 Hypertensive chronic kidney disease with stage 1 through stage 4 chronic kidney disease, or unspecified chronic kidney disease: Secondary | ICD-10-CM | POA: Diagnosis not present

## 2015-11-23 DIAGNOSIS — E1122 Type 2 diabetes mellitus with diabetic chronic kidney disease: Secondary | ICD-10-CM | POA: Diagnosis not present

## 2015-11-23 DIAGNOSIS — N189 Chronic kidney disease, unspecified: Secondary | ICD-10-CM | POA: Diagnosis not present

## 2015-11-23 DIAGNOSIS — I509 Heart failure, unspecified: Secondary | ICD-10-CM | POA: Diagnosis not present

## 2015-11-23 DIAGNOSIS — R531 Weakness: Secondary | ICD-10-CM | POA: Diagnosis not present

## 2015-11-27 DIAGNOSIS — E785 Hyperlipidemia, unspecified: Secondary | ICD-10-CM | POA: Diagnosis not present

## 2015-11-27 DIAGNOSIS — E1122 Type 2 diabetes mellitus with diabetic chronic kidney disease: Secondary | ICD-10-CM | POA: Diagnosis not present

## 2015-11-27 DIAGNOSIS — I509 Heart failure, unspecified: Secondary | ICD-10-CM | POA: Diagnosis not present

## 2015-11-27 DIAGNOSIS — I129 Hypertensive chronic kidney disease with stage 1 through stage 4 chronic kidney disease, or unspecified chronic kidney disease: Secondary | ICD-10-CM | POA: Diagnosis not present

## 2015-11-27 DIAGNOSIS — N189 Chronic kidney disease, unspecified: Secondary | ICD-10-CM | POA: Diagnosis not present

## 2015-11-27 DIAGNOSIS — R531 Weakness: Secondary | ICD-10-CM | POA: Diagnosis not present

## 2015-11-29 DIAGNOSIS — I129 Hypertensive chronic kidney disease with stage 1 through stage 4 chronic kidney disease, or unspecified chronic kidney disease: Secondary | ICD-10-CM | POA: Diagnosis not present

## 2015-11-29 DIAGNOSIS — R531 Weakness: Secondary | ICD-10-CM | POA: Diagnosis not present

## 2015-11-29 DIAGNOSIS — N189 Chronic kidney disease, unspecified: Secondary | ICD-10-CM | POA: Diagnosis not present

## 2015-11-29 DIAGNOSIS — E785 Hyperlipidemia, unspecified: Secondary | ICD-10-CM | POA: Diagnosis not present

## 2015-11-29 DIAGNOSIS — E1122 Type 2 diabetes mellitus with diabetic chronic kidney disease: Secondary | ICD-10-CM | POA: Diagnosis not present

## 2015-11-29 DIAGNOSIS — I509 Heart failure, unspecified: Secondary | ICD-10-CM | POA: Diagnosis not present

## 2015-12-01 DIAGNOSIS — E785 Hyperlipidemia, unspecified: Secondary | ICD-10-CM | POA: Diagnosis not present

## 2015-12-01 DIAGNOSIS — R531 Weakness: Secondary | ICD-10-CM | POA: Diagnosis not present

## 2015-12-01 DIAGNOSIS — I129 Hypertensive chronic kidney disease with stage 1 through stage 4 chronic kidney disease, or unspecified chronic kidney disease: Secondary | ICD-10-CM | POA: Diagnosis not present

## 2015-12-01 DIAGNOSIS — I509 Heart failure, unspecified: Secondary | ICD-10-CM | POA: Diagnosis not present

## 2015-12-01 DIAGNOSIS — N189 Chronic kidney disease, unspecified: Secondary | ICD-10-CM | POA: Diagnosis not present

## 2015-12-01 DIAGNOSIS — E1122 Type 2 diabetes mellitus with diabetic chronic kidney disease: Secondary | ICD-10-CM | POA: Diagnosis not present

## 2015-12-04 DIAGNOSIS — E1122 Type 2 diabetes mellitus with diabetic chronic kidney disease: Secondary | ICD-10-CM | POA: Diagnosis not present

## 2015-12-04 DIAGNOSIS — N189 Chronic kidney disease, unspecified: Secondary | ICD-10-CM | POA: Diagnosis not present

## 2015-12-04 DIAGNOSIS — I129 Hypertensive chronic kidney disease with stage 1 through stage 4 chronic kidney disease, or unspecified chronic kidney disease: Secondary | ICD-10-CM | POA: Diagnosis not present

## 2015-12-04 DIAGNOSIS — R531 Weakness: Secondary | ICD-10-CM | POA: Diagnosis not present

## 2015-12-04 DIAGNOSIS — I509 Heart failure, unspecified: Secondary | ICD-10-CM | POA: Diagnosis not present

## 2015-12-04 DIAGNOSIS — E785 Hyperlipidemia, unspecified: Secondary | ICD-10-CM | POA: Diagnosis not present

## 2015-12-06 DIAGNOSIS — I509 Heart failure, unspecified: Secondary | ICD-10-CM | POA: Diagnosis not present

## 2015-12-06 DIAGNOSIS — E785 Hyperlipidemia, unspecified: Secondary | ICD-10-CM | POA: Diagnosis not present

## 2015-12-06 DIAGNOSIS — I129 Hypertensive chronic kidney disease with stage 1 through stage 4 chronic kidney disease, or unspecified chronic kidney disease: Secondary | ICD-10-CM | POA: Diagnosis not present

## 2015-12-06 DIAGNOSIS — N189 Chronic kidney disease, unspecified: Secondary | ICD-10-CM | POA: Diagnosis not present

## 2015-12-06 DIAGNOSIS — E1122 Type 2 diabetes mellitus with diabetic chronic kidney disease: Secondary | ICD-10-CM | POA: Diagnosis not present

## 2015-12-06 DIAGNOSIS — R531 Weakness: Secondary | ICD-10-CM | POA: Diagnosis not present

## 2015-12-21 DIAGNOSIS — H353133 Nonexudative age-related macular degeneration, bilateral, advanced atrophic without subfoveal involvement: Secondary | ICD-10-CM | POA: Diagnosis not present

## 2015-12-21 DIAGNOSIS — H353232 Exudative age-related macular degeneration, bilateral, with inactive choroidal neovascularization: Secondary | ICD-10-CM | POA: Diagnosis not present

## 2015-12-21 DIAGNOSIS — E119 Type 2 diabetes mellitus without complications: Secondary | ICD-10-CM | POA: Diagnosis not present

## 2015-12-25 DIAGNOSIS — M1990 Unspecified osteoarthritis, unspecified site: Secondary | ICD-10-CM | POA: Diagnosis not present

## 2015-12-25 DIAGNOSIS — F322 Major depressive disorder, single episode, severe without psychotic features: Secondary | ICD-10-CM | POA: Diagnosis not present

## 2016-01-26 DIAGNOSIS — S8992XA Unspecified injury of left lower leg, initial encounter: Secondary | ICD-10-CM | POA: Diagnosis not present

## 2016-02-08 DIAGNOSIS — H353134 Nonexudative age-related macular degeneration, bilateral, advanced atrophic with subfoveal involvement: Secondary | ICD-10-CM | POA: Diagnosis not present

## 2016-03-14 DIAGNOSIS — R413 Other amnesia: Secondary | ICD-10-CM | POA: Diagnosis not present

## 2016-03-14 DIAGNOSIS — F322 Major depressive disorder, single episode, severe without psychotic features: Secondary | ICD-10-CM | POA: Diagnosis not present

## 2016-03-14 DIAGNOSIS — M1990 Unspecified osteoarthritis, unspecified site: Secondary | ICD-10-CM | POA: Diagnosis not present

## 2016-03-26 ENCOUNTER — Other Ambulatory Visit: Payer: Self-pay | Admitting: Cardiovascular Disease

## 2016-03-27 ENCOUNTER — Other Ambulatory Visit: Payer: Self-pay | Admitting: *Deleted

## 2016-03-27 MED ORDER — CARVEDILOL 12.5 MG PO TABS: 12.5000 mg | ORAL_TABLET | Freq: Two times a day (BID) | ORAL | Status: DC

## 2016-03-27 MED ORDER — SPIRONOLACTONE 25 MG PO TABS: 12.5000 mg | ORAL_TABLET | Freq: Every day | ORAL | Status: DC

## 2016-03-27 MED ORDER — LOSARTAN POTASSIUM 50 MG PO TABS: 50.0000 mg | ORAL_TABLET | Freq: Two times a day (BID) | ORAL | Status: DC

## 2016-04-10 DIAGNOSIS — N3090 Cystitis, unspecified without hematuria: Secondary | ICD-10-CM | POA: Diagnosis not present

## 2016-04-10 DIAGNOSIS — R300 Dysuria: Secondary | ICD-10-CM | POA: Diagnosis not present

## 2016-04-12 ENCOUNTER — Encounter (HOSPITAL_COMMUNITY): Payer: Self-pay | Admitting: Emergency Medicine

## 2016-04-12 ENCOUNTER — Emergency Department (HOSPITAL_COMMUNITY): Payer: Commercial Managed Care - HMO

## 2016-04-12 ENCOUNTER — Inpatient Hospital Stay (HOSPITAL_COMMUNITY)
Admission: EM | Admit: 2016-04-12 | Discharge: 2016-04-17 | DRG: 689 | Disposition: A | Discharge status: Skilled Nursing Facility | Payer: Commercial Managed Care - HMO | Attending: Internal Medicine | Admitting: Internal Medicine

## 2016-04-12 DIAGNOSIS — R05 Cough: Secondary | ICD-10-CM | POA: Diagnosis not present

## 2016-04-12 DIAGNOSIS — Z66 Do not resuscitate: Secondary | ICD-10-CM | POA: Diagnosis present

## 2016-04-12 DIAGNOSIS — N179 Acute kidney failure, unspecified: Secondary | ICD-10-CM | POA: Diagnosis present

## 2016-04-12 DIAGNOSIS — I4891 Unspecified atrial fibrillation: Secondary | ICD-10-CM | POA: Diagnosis present

## 2016-04-12 DIAGNOSIS — R531 Weakness: Secondary | ICD-10-CM | POA: Diagnosis not present

## 2016-04-12 DIAGNOSIS — R0602 Shortness of breath: Secondary | ICD-10-CM | POA: Diagnosis not present

## 2016-04-12 DIAGNOSIS — R404 Transient alteration of awareness: Secondary | ICD-10-CM | POA: Diagnosis not present

## 2016-04-12 DIAGNOSIS — Z96642 Presence of left artificial hip joint: Secondary | ICD-10-CM | POA: Diagnosis present

## 2016-04-12 DIAGNOSIS — I480 Paroxysmal atrial fibrillation: Secondary | ICD-10-CM | POA: Diagnosis not present

## 2016-04-12 DIAGNOSIS — K7689 Other specified diseases of liver: Secondary | ICD-10-CM | POA: Diagnosis not present

## 2016-04-12 DIAGNOSIS — E785 Hyperlipidemia, unspecified: Secondary | ICD-10-CM | POA: Diagnosis present

## 2016-04-12 DIAGNOSIS — I5043 Acute on chronic combined systolic (congestive) and diastolic (congestive) heart failure: Secondary | ICD-10-CM | POA: Diagnosis not present

## 2016-04-12 DIAGNOSIS — R296 Repeated falls: Secondary | ICD-10-CM | POA: Diagnosis not present

## 2016-04-12 DIAGNOSIS — R2681 Unsteadiness on feet: Secondary | ICD-10-CM | POA: Diagnosis not present

## 2016-04-12 DIAGNOSIS — Z7982 Long term (current) use of aspirin: Secondary | ICD-10-CM | POA: Diagnosis not present

## 2016-04-12 DIAGNOSIS — I5042 Chronic combined systolic (congestive) and diastolic (congestive) heart failure: Secondary | ICD-10-CM | POA: Diagnosis not present

## 2016-04-12 DIAGNOSIS — F329 Major depressive disorder, single episode, unspecified: Secondary | ICD-10-CM | POA: Diagnosis not present

## 2016-04-12 DIAGNOSIS — S72009A Fracture of unspecified part of neck of unspecified femur, initial encounter for closed fracture: Secondary | ICD-10-CM | POA: Diagnosis not present

## 2016-04-12 DIAGNOSIS — E1140 Type 2 diabetes mellitus with diabetic neuropathy, unspecified: Secondary | ICD-10-CM | POA: Diagnosis present

## 2016-04-12 DIAGNOSIS — E119 Type 2 diabetes mellitus without complications: Secondary | ICD-10-CM | POA: Diagnosis not present

## 2016-04-12 DIAGNOSIS — D649 Anemia, unspecified: Secondary | ICD-10-CM | POA: Diagnosis present

## 2016-04-12 DIAGNOSIS — S92909A Unspecified fracture of unspecified foot, initial encounter for closed fracture: Secondary | ICD-10-CM | POA: Diagnosis not present

## 2016-04-12 DIAGNOSIS — M79606 Pain in leg, unspecified: Secondary | ICD-10-CM | POA: Diagnosis not present

## 2016-04-12 DIAGNOSIS — I255 Ischemic cardiomyopathy: Secondary | ICD-10-CM | POA: Diagnosis present

## 2016-04-12 DIAGNOSIS — R300 Dysuria: Secondary | ICD-10-CM | POA: Diagnosis present

## 2016-04-12 DIAGNOSIS — H3530 Unspecified macular degeneration: Secondary | ICD-10-CM | POA: Diagnosis present

## 2016-04-12 DIAGNOSIS — I447 Left bundle-branch block, unspecified: Secondary | ICD-10-CM | POA: Diagnosis not present

## 2016-04-12 DIAGNOSIS — B961 Klebsiella pneumoniae [K. pneumoniae] as the cause of diseases classified elsewhere: Secondary | ICD-10-CM | POA: Diagnosis present

## 2016-04-12 DIAGNOSIS — I110 Hypertensive heart disease with heart failure: Secondary | ICD-10-CM | POA: Diagnosis present

## 2016-04-12 DIAGNOSIS — N12 Tubulo-interstitial nephritis, not specified as acute or chronic: Principal | ICD-10-CM | POA: Diagnosis present

## 2016-04-12 DIAGNOSIS — I10 Essential (primary) hypertension: Secondary | ICD-10-CM | POA: Diagnosis present

## 2016-04-12 DIAGNOSIS — M6281 Muscle weakness (generalized): Secondary | ICD-10-CM | POA: Diagnosis not present

## 2016-04-12 DIAGNOSIS — I509 Heart failure, unspecified: Secondary | ICD-10-CM | POA: Diagnosis not present

## 2016-04-12 DIAGNOSIS — K219 Gastro-esophageal reflux disease without esophagitis: Secondary | ICD-10-CM | POA: Diagnosis not present

## 2016-04-12 LAB — COMPREHENSIVE METABOLIC PANEL
ALBUMIN: 3 g/dL — AB (ref 3.5–5.0)
ALT: 25 U/L (ref 14–54)
AST: 36 U/L (ref 15–41)
Alkaline Phosphatase: 54 U/L (ref 38–126)
Anion gap: 10 (ref 5–15)
BILIRUBIN TOTAL: 0.5 mg/dL (ref 0.3–1.2)
BUN: 25 mg/dL — AB (ref 6–20)
CHLORIDE: 103 mmol/L (ref 101–111)
CO2: 24 mmol/L (ref 22–32)
CREATININE: 1.7 mg/dL — AB (ref 0.44–1.00)
Calcium: 8.6 mg/dL — ABNORMAL LOW (ref 8.9–10.3)
GFR calc Af Amer: 29 mL/min — ABNORMAL LOW (ref >60)
GFR, EST NON AFRICAN AMERICAN: 25 mL/min — AB (ref >60)
GLUCOSE: 135 mg/dL — AB (ref 65–99)
POTASSIUM: 3.6 mmol/L (ref 3.5–5.1)
SODIUM: 137 mmol/L (ref 135–145)
TOTAL PROTEIN: 6.2 g/dL — AB (ref 6.5–8.1)

## 2016-04-12 LAB — URINALYSIS, ROUTINE W REFLEX MICROSCOPIC
Glucose, UA: NEGATIVE mg/dL
KETONES UR: 15 mg/dL — AB
Nitrite: NEGATIVE
Protein, ur: NEGATIVE mg/dL
SPECIFIC GRAVITY, URINE: 1.024 (ref 1.005–1.030)
pH: 5.5 (ref 5.0–8.0)

## 2016-04-12 LAB — CBC WITH DIFFERENTIAL/PLATELET
Basophils Absolute: 0 10*3/uL (ref 0.0–0.1)
Basophils Relative: 0 %
EOS ABS: 0.4 10*3/uL (ref 0.0–0.7)
EOS PCT: 4 %
HCT: 33.2 % — ABNORMAL LOW (ref 36.0–46.0)
Hemoglobin: 11.2 g/dL — ABNORMAL LOW (ref 12.0–15.0)
LYMPHS ABS: 0.3 10*3/uL — AB (ref 0.7–4.0)
LYMPHS PCT: 3 %
MCH: 29.9 pg (ref 26.0–34.0)
MCHC: 33.7 g/dL (ref 30.0–36.0)
MCV: 88.8 fL (ref 78.0–100.0)
MONO ABS: 0.8 10*3/uL (ref 0.1–1.0)
Monocytes Relative: 8 %
Neutro Abs: 9 10*3/uL — ABNORMAL HIGH (ref 1.7–7.7)
Neutrophils Relative %: 85 %
PLATELETS: 139 10*3/uL — AB (ref 150–400)
RBC: 3.74 MIL/uL — ABNORMAL LOW (ref 3.87–5.11)
RDW: 13.9 % (ref 11.5–15.5)
WBC: 10.5 10*3/uL (ref 4.0–10.5)

## 2016-04-12 LAB — URINE MICROSCOPIC-ADD ON

## 2016-04-12 LAB — BRAIN NATRIURETIC PEPTIDE: B NATRIURETIC PEPTIDE 5: 79.9 pg/mL (ref 0.0–100.0)

## 2016-04-12 MED ORDER — SODIUM CHLORIDE 0.9 % IV BOLUS (SEPSIS)
1000.0000 mL | Freq: Once | INTRAVENOUS | Status: AC
  Administered 2016-04-12: 1000 mL via INTRAVENOUS

## 2016-04-12 MED ORDER — DEXTROSE 5 % IV SOLN
1.0000 g | Freq: Once | INTRAVENOUS | Status: AC
  Administered 2016-04-12: 1 g via INTRAVENOUS
  Filled 2016-04-12: qty 10

## 2016-04-12 NOTE — ED Notes (Signed)
Pt c/o having a UTI. Pt c/o urgency,burning,itching and frequency.

## 2016-04-12 NOTE — ED Notes (Signed)
Ems states pt she has a UTI. Pt started an antibiotic about 3 days ago. Pt states she feels worse today. Pt states she almost fell at home but lower her self down

## 2016-04-12 NOTE — ED Notes (Addendum)
Pt states she had a fever earlier today. Pt states she took tyl enol before comingto the ED

## 2016-04-12 NOTE — ED Notes (Addendum)
Rectal temp 101.6 Md notified

## 2016-04-12 NOTE — ED Provider Notes (Addendum)
CSN: [id number]     Arrival date & time 04/12/16  2030 History   First MD Initiated Contact with Patient 04/12/16 2031     Chief Complaint  Patient presents with  . Urinary Tract Infection     (Consider location/radiation/quality/duration/timing/severity/associated sxs/prior Treatment) Patient is a 82 y.o. female presenting with dysuria.  Dysuria Pain quality:  Sharp and burning Pain severity:  Mild Onset quality:  Gradual Duration:  3 days Timing:  Constant Progression:  Worsening Chronicity:  New Recent urinary tract infections: yes   Relieved by:  None tried Worsened by:  Nothing tried Ineffective treatments:  None tried Urinary symptoms: no discolored urine   Associated symptoms: fever and nausea   Associated symptoms: no abdominal pain and no flank pain   Risk factors: hx of pyelonephritis and recurrent urinary tract infections   Risk factors: no kidney transplant     Past Medical History  Diagnosis Date  . Cholelithiasis   . Other postprocedural status(V45.89)   . Atrial fibrillation (HCC)   . Hepatic cyst   . GERD (gastroesophageal reflux disease)   . LBBB (left bundle branch block)   . Diabetes mellitus   . Hyperlipidemia   . Hypertension   . Ischemic cardiomyopathy   . Dizziness     with some gait disability  . Peripheral edema   . Constipation   . Macular degeneration    Past Surgical History  Procedure Laterality Date  . Wrist surgery Bilateral     wrist fractures  . Dilation and curettage    . Hip arthroplasty  05/05/2012    Procedure: ARTHROPLASTY BIPOLAR HIP;  Surgeon: Shelda PalMatthew D Olin, MD;  Location: [facility];  Service: Orthopedics;  Laterality: Left;  . Cholecystectomy    . Ankle surgery Bilateral     fractures  . Tonsillectomy     Family History  Problem Relation Age of Onset  . Heart failure Brother     died age 80  . Leukemia Mother   . Arthritis Father   . Arthritis Brother    Social History  Substance Use Topics  . Smoking status:  Passive Smoke Exposure - Never Smoker  . 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 Alcohol Use: No   OB History    No data available     Review of Systems  Constitutional: Positive for fever. Negative for chills.  Respiratory: Positive for shortness of breath. Negative for cough.   Gastrointestinal: Positive for nausea. Negative for abdominal pain.  Genitourinary: Positive for dysuria. Negative for flank pain.  Musculoskeletal: Negative for back pain.  All other systems reviewed and are negative.     Allergies  Fosamax; Penicillins; Sulfonamide derivatives; and Tetanus toxoid  Home Medications   Prior to Admission medications   Medication Sig Start Date End Date Taking? Authorizing Provider  acetaminophen (TYLENOL) 500 MG tablet Take 500 mg by mouth every 6 (six) hours as needed for pain.   Yes Historical Provider, MD  aspirin 81 MG chewable tablet Chew 81 mg by mouth every evening.    Yes Historical Provider, MD  carvedilol (COREG) 12.5 MG tablet Take 1 tablet (12.5 mg total) by mouth 2 (two) times daily with a meal. 03/27/16  Yes Wendall StadePeter C Nishan, MD  diazepam (VALIUM) 5 MG tablet Take 5-10 mg by mouth every 6 (six) hours as needed for anxiety or sedation.   Yes Historical Provider, MD  furosemide (LASIX) 40 MG tablet Take  1 tablet (40 mg total) by mouth daily. 10/04/15  Yes Wendall Stade, MD  HYDROcodone-acetaminophen (NORCO) 5-325 MG tablet Take 1 tablet by mouth every 6 (six) hours as needed. For pain 10/07/14  Yes Historical Provider, MD  losartan (COZAAR) 50 MG tablet Take 1 tablet (50 mg total) by mouth 2 (two) times daily. 03/27/16  Yes Wendall Stade, MD  magnesium hydroxide (MILK OF MAGNESIA) 400 MG/5ML suspension Take 30 mLs by mouth daily as needed for mild constipation (constipation).   Yes Historical Provider, MD  mirtazapine (REMERON) 15 MG tablet Take 15 mg by mouth at bedtime.    Yes Historical Provider, MD  Multiple  Vitamins-Minerals (PRESERVISION AREDS PO) Take 1 tablet by mouth daily.    Yes Historical Provider, MD  nitrofurantoin, macrocrystal-monohydrate, (MACROBID) 100 MG capsule Take 100 mg by mouth 2 (two) times daily. Started 5/24, for 7 days ending 5/30 04/10/16  Yes Historical Provider, MD  omeprazole (PRILOSEC) 20 MG capsule Take 20 mg by mouth daily.   Yes Historical Provider, MD  sertraline (ZOLOFT) 50 MG tablet Take 50 mg by mouth at bedtime.  03/18/16  Yes Historical Provider, MD  simvastatin (ZOCOR) 40 MG tablet Take 1 tablet (40 mg total) by mouth daily. Patient taking differently: Take 40 mg by mouth daily at 6 PM.  10/04/15  Yes Wendall Stade, MD  spironolactone (ALDACTONE) 25 MG tablet Take 0.5 tablets (12.5 mg total) by mouth daily. 03/27/16  Yes Wendall Stade, MD   BP 106/47 mmHg  Pulse 75  Temp(Src) 99.7 F (37.6 C) (Oral)  Resp 18  Ht  (1.778 m)  Wt 199 lb (90.266 kg)  BMI 28.55 kg/m2  SpO2 92% Physical Exam  Constitutional: She appears well-developed and well-nourished.  HENT:  Head: Normocephalic and atraumatic.  Neck: Normal range of motion.  Cardiovascular: Normal rate and regular rhythm.   Pulmonary/Chest: No stridor. Tachypnea noted. No respiratory distress.  Abdominal: Soft. She exhibits no distension. There is tenderness (suprapubic).  Musculoskeletal: She exhibits tenderness (left cva).  Neurological: She is alert.  Skin: Skin is warm and dry. No rash noted.  Nursing note and vitals reviewed.   ED Course  Procedures (including critical care time) Labs Review Labs Reviewed  URINALYSIS, ROUTINE W REFLEX MICROSCOPIC (NOT AT [facility]) - Abnormal; Notable for the following:    Color, Urine AMBER (*)    APPearance CLOUDY (*)    Hgb urine dipstick SMALL (*)    Bilirubin Urine SMALL (*)    Ketones, ur 15 (*)    Leukocytes, UA LARGE (*)    All other components within normal limits  COMPREHENSIVE METABOLIC PANEL - Abnormal; Notable for the following:    Glucose,  Bld 135 (*)    BUN 25 (*)    Creatinine, Ser 1.70 (*)    Calcium 8.6 (*)    Total Protein 6.2 (*)    Albumin 3.0 (*)    GFR calc non Af Amer 25 (*)    GFR calc Af Amer 29 (*)    All other components within normal limits  CBC WITH DIFFERENTIAL/PLATELET - Abnormal; Notable for the following:    RBC 3.74 (*)    Hemoglobin 11.2 (*)    HCT 33.2 (*)    Platelets 139 (*)    Neutro Abs 9.0 (*)    Lymphs Abs 0.3 (*)    All other components within normal limits  URINE MICROSCOPIC-ADD ON - Abnormal; Notable for the following:    Squamous  Epithelial / LPF 0-5 (*)    Bacteria, UA RARE (*)    Casts HYALINE CASTS (*)    All other components within normal limits  URINE CULTURE  BRAIN NATRIURETIC PEPTIDE    Imaging Review Dg Chest 2 View  04/12/2016  CLINICAL DATA:  Cough and shortness of breath for 3 days. EXAM: CHEST  2 VIEW COMPARISON:  06/21/2014 FINDINGS: Heart at the upper limits of normal in size, however mildly increased from 2015. Atherosclerosis of the aortic arch. There are small bilateral pleural effusions. Mild increased interstitial opacities may reflect pulmonary edema. No confluent airspace disease to suggest pneumonia. The bones are under mineralized. IMPRESSION: Small pleural effusions. Mild increased interstitial opacities and borderline cardiomegaly. Findings suspicious for mild CHF. Electronically Signed   By: Rubye Oaks M.D.   On: 04/12/2016 22:34   I have personally reviewed and evaluated these images and lab results as part of my medical decision-making.   EKG Interpretation None      MDM   Final diagnoses:  Pyelonephritis    Worsening weakness, UTI at home not improving with 3 days of outpatient abx (nitrofurantoin) and starting to have left back pain.  Concern for likely early pyelo. Fluids/rocephin given here. Plan for admission as she can't walk, falls a lot and has worsening weakness.     Marily Memos, MD 04/13/16 1610  Marily Memos, MD 04/13/16  [id number]

## 2016-04-13 ENCOUNTER — Inpatient Hospital Stay (HOSPITAL_COMMUNITY): Payer: Commercial Managed Care - HMO

## 2016-04-13 ENCOUNTER — Encounter (HOSPITAL_COMMUNITY): Payer: Self-pay | Admitting: Family Medicine

## 2016-04-13 ENCOUNTER — Encounter (HOSPITAL_COMMUNITY): Payer: Commercial Managed Care - HMO

## 2016-04-13 DIAGNOSIS — I255 Ischemic cardiomyopathy: Secondary | ICD-10-CM | POA: Diagnosis present

## 2016-04-13 DIAGNOSIS — I5042 Chronic combined systolic (congestive) and diastolic (congestive) heart failure: Secondary | ICD-10-CM | POA: Diagnosis present

## 2016-04-13 DIAGNOSIS — N12 Tubulo-interstitial nephritis, not specified as acute or chronic: Principal | ICD-10-CM

## 2016-04-13 DIAGNOSIS — M79606 Pain in leg, unspecified: Secondary | ICD-10-CM | POA: Diagnosis not present

## 2016-04-13 DIAGNOSIS — E119 Type 2 diabetes mellitus without complications: Secondary | ICD-10-CM | POA: Diagnosis present

## 2016-04-13 DIAGNOSIS — E1140 Type 2 diabetes mellitus with diabetic neuropathy, unspecified: Secondary | ICD-10-CM

## 2016-04-13 DIAGNOSIS — Z7982 Long term (current) use of aspirin: Secondary | ICD-10-CM | POA: Diagnosis not present

## 2016-04-13 DIAGNOSIS — I447 Left bundle-branch block, unspecified: Secondary | ICD-10-CM | POA: Diagnosis present

## 2016-04-13 DIAGNOSIS — I509 Heart failure, unspecified: Secondary | ICD-10-CM | POA: Diagnosis not present

## 2016-04-13 DIAGNOSIS — K219 Gastro-esophageal reflux disease without esophagitis: Secondary | ICD-10-CM | POA: Diagnosis present

## 2016-04-13 DIAGNOSIS — Z96642 Presence of left artificial hip joint: Secondary | ICD-10-CM | POA: Diagnosis present

## 2016-04-13 DIAGNOSIS — H3530 Unspecified macular degeneration: Secondary | ICD-10-CM | POA: Diagnosis present

## 2016-04-13 DIAGNOSIS — Z66 Do not resuscitate: Secondary | ICD-10-CM | POA: Diagnosis present

## 2016-04-13 DIAGNOSIS — I5043 Acute on chronic combined systolic (congestive) and diastolic (congestive) heart failure: Secondary | ICD-10-CM | POA: Diagnosis present

## 2016-04-13 DIAGNOSIS — F329 Major depressive disorder, single episode, unspecified: Secondary | ICD-10-CM | POA: Diagnosis present

## 2016-04-13 DIAGNOSIS — N179 Acute kidney failure, unspecified: Secondary | ICD-10-CM | POA: Diagnosis not present

## 2016-04-13 DIAGNOSIS — I10 Essential (primary) hypertension: Secondary | ICD-10-CM

## 2016-04-13 DIAGNOSIS — E785 Hyperlipidemia, unspecified: Secondary | ICD-10-CM | POA: Diagnosis present

## 2016-04-13 DIAGNOSIS — B961 Klebsiella pneumoniae [K. pneumoniae] as the cause of diseases classified elsewhere: Secondary | ICD-10-CM | POA: Diagnosis present

## 2016-04-13 DIAGNOSIS — I480 Paroxysmal atrial fibrillation: Secondary | ICD-10-CM

## 2016-04-13 DIAGNOSIS — I110 Hypertensive heart disease with heart failure: Secondary | ICD-10-CM | POA: Diagnosis present

## 2016-04-13 DIAGNOSIS — D649 Anemia, unspecified: Secondary | ICD-10-CM | POA: Diagnosis present

## 2016-04-13 DIAGNOSIS — R300 Dysuria: Secondary | ICD-10-CM | POA: Diagnosis present

## 2016-04-13 DIAGNOSIS — I4891 Unspecified atrial fibrillation: Secondary | ICD-10-CM | POA: Diagnosis not present

## 2016-04-13 LAB — CBC
HCT: 31.9 % — ABNORMAL LOW (ref 36.0–46.0)
Hemoglobin: 10.5 g/dL — ABNORMAL LOW (ref 12.0–15.0)
MCH: 29.4 pg (ref 26.0–34.0)
MCHC: 32.9 g/dL (ref 30.0–36.0)
MCV: 89.4 fL (ref 78.0–100.0)
PLATELETS: 123 10*3/uL — AB (ref 150–400)
RBC: 3.57 MIL/uL — AB (ref 3.87–5.11)
RDW: 13.9 % (ref 11.5–15.5)
WBC: 7.4 10*3/uL (ref 4.0–10.5)

## 2016-04-13 LAB — CREATININE, SERUM
CREATININE: 1.49 mg/dL — AB (ref 0.44–1.00)
GFR calc Af Amer: 34 mL/min — ABNORMAL LOW (ref >60)
GFR calc non Af Amer: 29 mL/min — ABNORMAL LOW (ref >60)

## 2016-04-13 LAB — BASIC METABOLIC PANEL
ANION GAP: 12 (ref 5–15)
BUN: 22 mg/dL — ABNORMAL HIGH (ref 6–20)
CHLORIDE: 103 mmol/L (ref 101–111)
CO2: 24 mmol/L (ref 22–32)
Calcium: 8.2 mg/dL — ABNORMAL LOW (ref 8.9–10.3)
Creatinine, Ser: 1.24 mg/dL — ABNORMAL HIGH (ref 0.44–1.00)
GFR calc non Af Amer: 37 mL/min — ABNORMAL LOW (ref >60)
GFR, EST AFRICAN AMERICAN: 43 mL/min — AB (ref >60)
Glucose, Bld: 103 mg/dL — ABNORMAL HIGH (ref 65–99)
POTASSIUM: 4 mmol/L (ref 3.5–5.1)
SODIUM: 139 mmol/L (ref 135–145)

## 2016-04-13 LAB — GLUCOSE, CAPILLARY
GLUCOSE-CAPILLARY: 103 mg/dL — AB (ref 65–99)
GLUCOSE-CAPILLARY: 115 mg/dL — AB (ref 65–99)
Glucose-Capillary: 108 mg/dL — ABNORMAL HIGH (ref 65–99)
Glucose-Capillary: 111 mg/dL — ABNORMAL HIGH (ref 65–99)
Glucose-Capillary: 114 mg/dL — ABNORMAL HIGH (ref 65–99)

## 2016-04-13 LAB — LACTIC ACID, PLASMA: LACTIC ACID, VENOUS: 1 mmol/L (ref 0.5–2.0)

## 2016-04-13 LAB — MRSA PCR SCREENING: MRSA by PCR: NEGATIVE

## 2016-04-13 MED ORDER — ONDANSETRON HCL 4 MG PO TABS: 4.0000 mg | ORAL_TABLET | Freq: Four times a day (QID) | ORAL | Status: DC | PRN

## 2016-04-13 MED ORDER — DIAZEPAM 5 MG PO TABS: 5.0000 mg | ORAL_TABLET | Freq: Four times a day (QID) | ORAL | Status: DC | PRN

## 2016-04-13 MED ORDER — ACETAMINOPHEN 325 MG PO TABS
650.0000 mg | ORAL_TABLET | Freq: Four times a day (QID) | ORAL | Status: DC | PRN
Start: 2016-04-13 — End: 2016-04-17
  Administered 2016-04-13 – 2016-04-17 (×4): 650 mg via ORAL
  Filled 2016-04-13 (×5): qty 2

## 2016-04-13 MED ORDER — CARVEDILOL 3.125 MG PO TABS
3.1250 mg | ORAL_TABLET | Freq: Two times a day (BID) | ORAL | Status: DC
  Administered 2016-04-13 – 2016-04-16 (×6): 3.125 mg via ORAL
  Filled 2016-04-13 (×7): qty 1

## 2016-04-13 MED ORDER — INSULIN ASPART 100 UNIT/ML ~~LOC~~ SOLN
0.0000 [IU] | Freq: Three times a day (TID) | SUBCUTANEOUS | Status: DC
  Administered 2016-04-15 – 2016-04-17 (×3): 1 [IU] via SUBCUTANEOUS

## 2016-04-13 MED ORDER — DEXTROSE 5 % IV SOLN
1.0000 g | INTRAVENOUS | Status: DC
  Administered 2016-04-13 – 2016-04-14 (×2): 1 g via INTRAVENOUS
  Filled 2016-04-13 (×3): qty 10

## 2016-04-13 MED ORDER — ACETAMINOPHEN 650 MG RE SUPP: 650.0000 mg | Freq: Four times a day (QID) | RECTAL | Status: DC | PRN

## 2016-04-13 MED ORDER — CARVEDILOL 12.5 MG PO TABS: 12.5000 mg | ORAL_TABLET | Freq: Two times a day (BID) | ORAL | Status: DC

## 2016-04-13 MED ORDER — PANTOPRAZOLE SODIUM 40 MG PO TBEC
40.0000 mg | DELAYED_RELEASE_TABLET | Freq: Every day | ORAL | Status: DC
  Administered 2016-04-13 – 2016-04-17 (×5): 40 mg via ORAL
  Filled 2016-04-13 (×5): qty 1

## 2016-04-13 MED ORDER — SIMVASTATIN 40 MG PO TABS
40.0000 mg | ORAL_TABLET | Freq: Every day | ORAL | Status: DC
  Administered 2016-04-13 – 2016-04-16 (×4): 40 mg via ORAL
  Filled 2016-04-13 (×4): qty 1

## 2016-04-13 MED ORDER — ASPIRIN 81 MG PO CHEW
81.0000 mg | CHEWABLE_TABLET | Freq: Every evening | ORAL | Status: DC
  Administered 2016-04-13 – 2016-04-16 (×4): 81 mg via ORAL
  Filled 2016-04-13 (×4): qty 1

## 2016-04-13 MED ORDER — ONDANSETRON HCL 4 MG/2ML IJ SOLN: 4.0000 mg | Freq: Four times a day (QID) | INTRAMUSCULAR | Status: DC | PRN

## 2016-04-13 MED ORDER — POTASSIUM CHLORIDE IN NACL 20-0.9 MEQ/L-% IV SOLN
INTRAVENOUS | Status: DC
  Administered 2016-04-13: 02:00:00 via INTRAVENOUS
  Filled 2016-04-13 (×3): qty 1000

## 2016-04-13 MED ORDER — ENOXAPARIN SODIUM 30 MG/0.3ML ~~LOC~~ SOLN
30.0000 mg | Freq: Every day | SUBCUTANEOUS | Status: DC
Start: 2016-04-13 — End: 2016-04-13
  Administered 2016-04-13: 30 mg via SUBCUTANEOUS
  Filled 2016-04-13: qty 0.3

## 2016-04-13 MED ORDER — INSULIN ASPART 100 UNIT/ML ~~LOC~~ SOLN: 0.0000 [IU] | Freq: Every day | SUBCUTANEOUS | Status: DC

## 2016-04-13 MED ORDER — SERTRALINE HCL 50 MG PO TABS
50.0000 mg | ORAL_TABLET | Freq: Every day | ORAL | Status: DC
  Administered 2016-04-13 – 2016-04-16 (×5): 50 mg via ORAL
  Filled 2016-04-13 (×5): qty 1

## 2016-04-13 MED ORDER — ENOXAPARIN SODIUM 40 MG/0.4ML ~~LOC~~ SOLN
40.0000 mg | Freq: Every day | SUBCUTANEOUS | Status: DC
  Administered 2016-04-14 – 2016-04-17 (×4): 40 mg via SUBCUTANEOUS
  Filled 2016-04-13 (×4): qty 0.4

## 2016-04-13 MED ORDER — MIRTAZAPINE 15 MG PO TABS
15.0000 mg | ORAL_TABLET | Freq: Every day | ORAL | Status: DC
  Administered 2016-04-13 – 2016-04-16 (×5): 15 mg via ORAL
  Filled 2016-04-13 (×5): qty 1

## 2016-04-13 MED ORDER — ACETAMINOPHEN 500 MG PO TABS: 500.0000 mg | ORAL_TABLET | Freq: Four times a day (QID) | ORAL | Status: DC | PRN

## 2016-04-13 NOTE — Progress Notes (Signed)
VASCULAR LAB PRELIMINARY  PRELIMINARY  PRELIMINARY  PRELIMINARY  Bilateral lower extremity venous duplex completed.    Preliminary report:  There is no DVT or SVT noted in the bilateral lower extremities.   Jiya Kissinger, RVT 04/13/2016, 7:00 PM

## 2016-04-13 NOTE — Progress Notes (Signed)
PROGRESS NOTE  [name] JXB:[id number] DOB: Oct 20, 1923 DOA: 04/12/2016 PCP: [name], MD  Brief History:  82 year old female with a history of paroxysmal atrial fibrillation, diabetes mellitus type 2, systolic CHF, hypertension presented with two-week history of dysuria and urinary frequency. The patient's symptoms worsened with increasing suprapubic pain for 5 days prior to admission with associated nausea. In addition, the patient complained of increasing generalized weakness to the point where she was having difficulty ambulating. She stated that her legs gave out earlier on the day of admission resulting in a mechanical fall. She saw her primary care provider on 04/10/2016. She was prescribed nitrofurantoin. However because of her nausea, she only took one days worth of the medications before stopping. Upon presentation, the patient was noted to have fever of 101.10F rectally with WBC 10.5 and pyuria. The patient was started on ceftriaxone empirically pending culture data.  The patient has also been complaining of dyspnea and weight gain. She states that she is chronically short of breath, but states that she has gained 5 pounds in the past week. She denies any chest discomfort, dizziness, syncope.  Assessment/Plan: Pyelonephritis -Continue ceftriaxone pending culture data -Lactic acid 1.0  Acute on chronic systolic and diastolic CHF -Previous echocardiograms revealed suppressed EF -Most recent echo on 03/02/2015 EF 50-55 percent, grade 1 DD -Patient states she has gained 5 pounds weighing herself at home in the past week  -Reviewed the medical record shows that she is approximately 12 pounds above her previous dry weight of 198 in October 2016  -Start intravenous furosemide and monitor renal function  -Daily weights  -strict I/O -Restart lower dose carvedilol secondary to the patient's soft blood pressures  -Hold losartan in the setting of worsening serum  creatinine  -Repeat echocardiogram   Paroxysmal atrial fibrillation -Not a candidate for anticoagulation due to frequent falls -Continue aspirin -CHADSVASc = 6 -Restart low-dose carvedilol  Hyperlipidemia -Continue statin  depression -Continue mirtazapine and sertraline  Diabetes mellitus type 2 -Patient is not on any agents at this time -Hemoglobin A1c  LE pain and edema -duplex r/o DVT  Gait instability/deconditioning -PT evaluation   Disposition Plan:   SNF 2-3 days  Family Communication:   No Family at beside--total time 60 min 1017-1117; >50% spent counseling and coordinating care  Consultants:  none  Code Status:  DNR    Subjective:  She states her shortness of breath is not worse than usual. Denies any chest pain, vomiting, diarrhea. Abdominal pain is better. Denies fevers, chills, headache, neck pain  Objective: Filed Vitals:   04/13/16 0000 04/13/16 0055 04/13/16 0437 04/13/16 0959  BP: 110/48 125/48 126/49 130/60  Pulse: 73 68 68 70  Temp:  97.9 F (36.6 C) 97.9 F (36.6 C) 97.8 F (36.6 C)  TempSrc:  Oral Oral Oral  Resp:  Height:   (1.778 m)    Weight:  95.6 kg (210 lb 12.2 oz)    SpO2: 96% 97% 97% 98%    Intake/Output Summary (Last 24 hours) at 04/13/16 1101 Last data filed at 04/13/16 0900  Gross per 24 hour  Intake 676.67 ml  Output    450 ml  Net 226.67 ml   Weight change:  Exam:   General:  Pt is alert, follows commands appropriately, not in acute distress  HEENT: No icterus, No thrush, No neck mass, [place]/AT  Cardiovascular: RRR, S1/S2, no rubs, no gallops  Respiratory: Fine bibasilar crackles.  No wheezing. Good air movement  Abdomen: Soft/+BS, non tender, non distended, no guarding  Extremities: 1+LE edema, No lymphangitis, No petechiae, No rashes, no synovitis   Data Reviewed: I have personally reviewed following labs and imaging studies Basic Metabolic Panel:  Recent Labs Lab 04/12/16 2228  04/13/16 0534  NA 137  --   K 3.6  --   CL 103  --   CO2 24  --   GLUCOSE 135*  --   BUN 25*  --   CREATININE 1.70* 1.49*  CALCIUM 8.6*  --    Liver Function Tests:  Recent Labs Lab 04/12/16 2228  AST 36  ALT 25  ALKPHOS 54  BILITOT 0.5  PROT 6.2*  ALBUMIN 3.0*   No results for input(s): LIPASE, AMYLASE in the last 168 hours. No results for input(s): AMMONIA in the last 168 hours. Coagulation Profile: No results for input(s): INR, PROTIME in the last 168 hours. CBC:  Recent Labs Lab 04/12/16 2228 04/13/16 0534  WBC 10.5 7.4  NEUTROABS 9.0*  --   HGB 11.2* 10.5*  HCT 33.2* 31.9*  MCV 88.8 89.4  PLT 139* 123*   Cardiac Enzymes: No results for input(s): CKTOTAL, CKMB, CKMBINDEX, TROPONINI in the last 168 hours. BNP: Invalid input(s): POCBNP CBG:  Recent Labs Lab 04/13/16 0048 04/13/16 0754  GLUCAP 108* 114*   HbA1C: No results for input(s): HGBA1C in the last 72 hours. Urine analysis:    Component Value Date/Time   COLORURINE AMBER* 04/12/2016 2243   APPEARANCEUR CLOUDY* 04/12/2016 2243   LABSPEC 1.024 04/12/2016 2243   PHURINE 5.5 04/12/2016 2243   GLUCOSEU NEGATIVE 04/12/2016 2243   HGBUR SMALL* 04/12/2016 2243   BILIRUBINUR SMALL* 04/12/2016 2243   KETONESUR 15* 04/12/2016 2243   PROTEINUR NEGATIVE 04/12/2016 2243   UROBILINOGEN 0.2 06/21/2014 1924   NITRITE NEGATIVE 04/12/2016 2243   LEUKOCYTESUR LARGE* 04/12/2016 2243   Sepsis Labs: @LABRCNTIP (procalcitonin:4,lacticidven:4) ) Recent Results (from the past 240 hour(s))  MRSA PCR Screening     Status: None   Collection Time: 04/13/16  1:53 AM  Result Value Ref Range Status   MRSA by PCR NEGATIVE NEGATIVE Final    Comment:        The GeneXpert MRSA Assay (FDA approved for NASAL specimens only), is one component of a comprehensive MRSA colonization surveillance program. It is not intended to diagnose MRSA infection nor to guide or monitor treatment for MRSA infections.       Scheduled Meds: . aspirin  81 mg Oral QPM  . cefTRIAXone (ROCEPHIN) IVPB 1 gram/50 mL D5W  1 g Intravenous Q24H  . enoxaparin (LOVENOX) injection  30 mg Subcutaneous Daily  . insulin aspart  0-5 Units Subcutaneous QHS  . insulin aspart  0-9 Units Subcutaneous TID WC  . mirtazapine  15 mg Oral QHS  . pantoprazole  40 mg Oral Daily  . sertraline  50 mg Oral QHS  . simvastatin  40 mg Oral q1800   Continuous Infusions: . 0.9 % NaCl with KCl 20 mEq / L 100 mL/hr at 04/13/16 0138    Procedures/Studies: Dg Chest 2 View  04/12/2016  CLINICAL DATA:  Cough and shortness of breath for 3 days. EXAM: CHEST  2 VIEW COMPARISON:  06/21/2014 FINDINGS: Heart at the upper limits of normal in size, however mildly increased from 2015. Atherosclerosis of the aortic arch. There are small bilateral pleural effusions. Mild increased interstitial opacities may reflect pulmonary edema. No confluent airspace disease to suggest pneumonia. The bones are  under mineralized. IMPRESSION: Small pleural effusions. Mild increased interstitial opacities and borderline cardiomegaly. Findings suspicious for mild CHF. Electronically Signed   By: Rubye Oaks M.D.   On: 04/12/2016 22:34    Kayleann Mccaffery, DO  [facility] Pager [phone]  If 7PM-7AM, please contact night-coverage [email or web address] Password [email or web address] 04/13/2016, 11:01 AM   LOS: 0 days

## 2016-04-13 NOTE — Progress Notes (Signed)
Called ER RN for report, Room ready.

## 2016-04-13 NOTE — H&P (Signed)
History and Physical  Patient Name: [name]     ZOX:[id number]    DOB: Oct 05, 1923    DOA: 04/12/2016 PCP: Lupita RaiderSHAW,KIMBERLEE, MD   Patient coming from: Independent living facility  Chief Complaint: Weakness, dysuria  HPI: [name] is a 82 y.o. female with a past medical history significant for pAF not on warfarin because of falls, NIDDM, CHF, HTN, and depression who presents with falls and UTI.  The patient was in her usual state of health until about 2 weeks ago when she started to develop urinary frequency. Then over the last 5 days she has had dysuria, suprapubic pain, and left lower flank pain. She went to her PCP on Wednesday and was prescribed nitrofurantoin and a urine was collected at [facility]. However Thursday she felt sick, but this was a reaction to the nitrofurantoin, didn't take it all day. On Friday she decided to take it again, but by then she was "so wobbly I could barely stand", had decreased urine output, and finally collapsed (got tired and wobbly after walking to the kitchen to take one of her pills, legs gave out and she lowered herslef to the floor and couldn't get up) so she came to the ER.  ED course: -Fever to 101F rectally, mild tachycardia, tachypnic, BP 106/57. -Na 137, K 3.6, Cr 1.7 (baseline 1.0 but last in 2015), WBC 10.5K, Hgb 11.2, chronic -UA showed few bacteria and pyuria       Review of Systems:  Pt complains of urinary frequency, dysuria, suprapubic pain, flank pain, weakness, malaise. Pt denies any cough, shortness of breath, chills, purulent sputum.  All other systems negative except as just noted or noted in the history of present illness.    Past Medical History  Diagnosis Date  . Cholelithiasis   . Other postprocedural status(V45.89)   . Atrial fibrillation (HCC)   . Hepatic cyst   . GERD (gastroesophageal reflux disease)   . LBBB (left bundle branch block)   . Diabetes mellitus   . Hyperlipidemia   . Hypertension   .  Ischemic cardiomyopathy   . Dizziness     with some gait disability  . Peripheral edema   . Constipation   . Macular degeneration     Past Surgical History  Procedure Laterality Date  . Wrist surgery Bilateral     wrist fractures  . Dilation and curettage    . Hip arthroplasty  05/05/2012    Procedure: ARTHROPLASTY BIPOLAR HIP;  Surgeon: Shelda PalMatthew D Olin, MD;  Location: [facility];  Service: Orthopedics;  Laterality: Left;  . Cholecystectomy    . Ankle surgery Bilateral     fractures  . Tonsillectomy      Social History: [facility].  The patient walks with a walker.  She requires assistance to dress and bathe.  She does not smoke.  She does not drink.    Allergies  Allergen Reactions  . Fosamax [Alendronate Sodium] Swelling  . Penicillins Swelling  . Sulfonamide Derivatives Swelling  . Tetanus Toxoid Swelling    Family history: family history includes Arthritis in her brother and father; Heart failure in her brother; Leukemia in her mother.  Prior to Admission medications   Medication Sig Start Date End Date Taking? Authorizing Provider  acetaminophen (TYLENOL) 500 MG tablet Take 500 mg by mouth every 6 (six) hours as needed for pain.   Yes Historical Provider, MD  aspirin 81 MG chewable tablet Chew 81 mg by mouth every  evening.    Yes Historical Provider, MD  carvedilol (COREG) 12.5 MG tablet Take 1 tablet (12.5 mg total) by mouth 2 (two) times daily with a meal. 03/27/16  Yes Wendall Stade, MD  diazepam (VALIUM) 5 MG tablet Take 5-10 mg by mouth every 6 (six) hours as needed for anxiety or sedation.   Yes Historical Provider, MD  furosemide (LASIX) 40 MG tablet Take 1 tablet (40 mg total) by mouth daily. 10/04/15  Yes Wendall Stade, MD  HYDROcodone-acetaminophen (NORCO) 5-325 MG tablet Take 1 tablet by mouth every 6 (six) hours as needed. For pain 10/07/14  Yes Historical Provider, MD  losartan (COZAAR) 50 MG tablet Take 1 tablet (50 mg total) by  mouth 2 (two) times daily. 03/27/16  Yes Wendall Stade, MD  magnesium hydroxide (MILK OF MAGNESIA) 400 MG/5ML suspension Take 30 mLs by mouth daily as needed for mild constipation (constipation).   Yes Historical Provider, MD  mirtazapine (REMERON) 15 MG tablet Take 15 mg by mouth at bedtime.    Yes Historical Provider, MD  Multiple Vitamins-Minerals (PRESERVISION AREDS PO) Take 1 tablet by mouth daily.    Yes Historical Provider, MD  nitrofurantoin, macrocrystal-monohydrate, (MACROBID) 100 MG capsule Take 100 mg by mouth 2 (two) times daily. Started 5/24, for 7 days ending 5/30 04/10/16  Yes Historical Provider, MD  omeprazole (PRILOSEC) 20 MG capsule Take 20 mg by mouth daily.   Yes Historical Provider, MD  sertraline (ZOLOFT) 50 MG tablet Take 50 mg by mouth at bedtime.  03/18/16  Yes Historical Provider, MD  simvastatin (ZOCOR) 40 MG tablet Take 1 tablet (40 mg total) by mouth daily. Patient taking differently: Take 40 mg by mouth daily at 6 PM.  10/04/15  Yes Wendall Stade, MD  spironolactone (ALDACTONE) 25 MG tablet Take 0.5 tablets (12.5 mg total) by mouth daily. 03/27/16  Yes Wendall Stade, MD       Physical Exam: BP 110/48 mmHg  Pulse 73  Temp(Src) 99.7 F (37.6 C) (Oral)  Resp 10  Ht 5\' 10"  (1.778 m)  Wt 90.266 kg (199 lb)  BMI 28.55 kg/m2  SpO2 96% General appearance: Frail elderly adult female, alert and in moderate distress from malaise.   Eyes: Anicteric, conjunctiva pink, lids and lashes normal.     ENT: No nasal deformity, discharge, or epistaxis.  OP tacky dry without lesions.   Lymph: No cervical or supraclavicular lymphadenopathy. Skin: Warm and dry.  No jaundice.  No suspicious rashes or lesions. Cardiac: RRR, nl S1-S2, 1/6 SEM.  Capillary refill is brisk.  JVP not visible.  No LE edema.  Radial pulses 2+ and symmetric. Respiratory: Normal respiratory rate and rhythm.  CTAB without rales or wheezes. Abdomen: Abdomen soft without rigidity.  Mild TTP over bladder. No  ascites, distension.   MSK: No deformities or effusions. Neuro: Cranial nerves normal.  Appears tired and frail and weak.  Unable to sit up alone.  Strength symmetric in UE but very weak, 4/5 bilaterally.  Sensorium intact, oriented.  Attention normal.  Speech is fluent.      Psych: Affect tearful.  Expersses passive suicidal ideation, denies intent to harm self.  No evidence of aural or visual hallucinations or delusions.       Labs on Admission:  I have personally reviewed following labs and imaging studies: CBC:  Recent Labs Lab 04/12/16 2228  WBC 10.5  NEUTROABS 9.0*  HGB 11.2*  HCT 33.2*  MCV 88.8  PLT 139*  Basic Metabolic Panel:  Recent Labs Lab 04/12/16 2228  NA 137  K 3.6  CL 103  CO2 24  GLUCOSE 135*  BUN 25*  CREATININE 1.70*  CALCIUM 8.6*   GFR: Estimated Creatinine Clearance: 26.3 mL/min (by C-G formula based on Cr of 1.7). Liver Function Tests:  Recent Labs Lab 04/12/16 2228  AST 36  ALT 25  ALKPHOS 54  BILITOT 0.5  PROT 6.2*  ALBUMIN 3.0*   No results for input(s): LIPASE, AMYLASE in the last 168 hours. No results for input(s): AMMONIA in the last 168 hours. Coagulation Profile: No results for input(s): INR, PROTIME in the last 168 hours. Cardiac Enzymes: No results for input(s): CKTOTAL, CKMB, CKMBINDEX, TROPONINI in the last 168 hours. BNP (last 3 results) No results for input(s): PROBNP in the last 8760 hours. HbA1C: No results for input(s): HGBA1C in the last 72 hours. CBG: No results for input(s): GLUCAP in the last 168 hours. Lipid Profile: No results for input(s): CHOL, HDL, LDLCALC, TRIG, CHOLHDL, LDLDIRECT in the last 72 hours. Thyroid Function Tests: No results for input(s): TSH, T4TOTAL, FREET4, T3FREE, THYROIDAB in the last 72 hours. Anemia Panel: No results for input(s): VITAMINB12, FOLATE, FERRITIN, TIBC, IRON, RETICCTPCT in the last 72 hours. Sepsis Labs: (procalcitonin:4,lacticidven:4) )No results found for  this or any previous visit (from the past 240 hour(s)).       Radiological Exams on Admission: Personally reviewed: Dg Chest 2 View  04/12/2016  CLINICAL DATA:  Cough and shortness of breath for 3 days. EXAM: CHEST  2 VIEW COMPARISON:  06/21/2014 FINDINGS: Heart at the upper limits of normal in size, however mildly increased from 2015. Atherosclerosis of the aortic arch. There are small bilateral pleural effusions. Mild increased interstitial opacities may reflect pulmonary edema. No confluent airspace disease to suggest pneumonia. The bones are under mineralized. IMPRESSION: Small pleural effusions. Mild increased interstitial opacities and borderline cardiomegaly. Findings suspicious for mild CHF. Electronically Signed   By: Rubye Oaks M.D.   On: 04/12/2016 22:34    EKG: Independently reviewed. Rate 72, LBBB, old.    Assessment/Plan 1. Pyelonephritis:  She has UTI that has progressed, and was incompletely treated at home.   Do not suspect sepsis syndrome.  Will check lactic acid. -Continue ceftriaxone -Urine culture pending   -Check lactic acid   2. AKI vs dehdration:  -Fluids -Hold diuretics -Trend BMP   3. HTN:  -Hold furosemide, spironolactone, ARB, Coreg, restart when able  4. Chronic systolic and diastolic CHF:  Last EF 50% in 1610, previously reduced EF per Cardiology notes.   -Hold diuretics for next 24 hours while resuscitating -Strict I/Os, daily BMP  5. NIDDM:  -Sliding scale correcitons with meals  6. Afib:  Currently in sinus.  pAF per Cardiology notes. Not on warfarin.  CHADS2Vasc 6.  Not anticoagulated due to frequent falls.  7. Normocytic anemia:  Chronic, stable.  8. Depression: Patient emotional and depressed.  Denies suicidal intent. -Continue mirtazapine and sertraline    DVT prophylaxis: Lovenox  Code Status: DO NOT RESUSCITATE  Family Communication: Niece at bedside, Nils Pyle, CODE STATUS confirmed, treatment plan discussed and  questions answered  Disposition Plan: Anticipate IV fluids and antibiotics.  Monitor clinically. Consults called: None Admission status: Inpatient, med surg bed.   Medical decision making: Patient seen at 12:31 AM on 04/13/2016.  The patient was discussed with Dr. Clayborne Dana. What exists of the patient's chart was reviewed in depth.  Clinical condition: requiring additional fluids, but currently hemodynamically  stable.        Alberteen Sam [facility] Pager [phone]    At the time of admission, it appears that the appropriate admission status for this patient is INPATIENT. This is judged to be reasonable and necessary in order to provide the required intensity of service to ensure the patient's safety given the presenting symptoms, physical exam findings, and initial radiographic and laboratory data in the context of their chronic comorbidities.  Together, these circumstances are felt to place her/him at high risk for further clinical deterioration threatening life, limb, or organ. The following factors support the admission status of inpatient:   A. The patient's presenting symptoms include weakness, dysuria, fall. B. The worrisome physical exam findings include fever, tachypnea, weakness, inability to stand, abdominal pain. C. The initial radiographic and laboratory data are worrisome because of pyuria, elevated creatinine. D. The chronic co-morbidities include chronic systolic CHF, HTN, atrial fibrillation, and diabetes. E. Patient requires inpatient status due to high intensity of service, high risk for further deterioration and high frequency of surveillance required. F. I certify that at the point of admission it is my clinical judgment that the patient will require inpatient hospital care spanning beyond 2 midnights from the point of admission.

## 2016-04-14 ENCOUNTER — Inpatient Hospital Stay (HOSPITAL_COMMUNITY): Payer: Commercial Managed Care - HMO

## 2016-04-14 DIAGNOSIS — I509 Heart failure, unspecified: Secondary | ICD-10-CM

## 2016-04-14 DIAGNOSIS — N179 Acute kidney failure, unspecified: Secondary | ICD-10-CM

## 2016-04-14 LAB — BASIC METABOLIC PANEL
ANION GAP: 7 (ref 5–15)
BUN: 18 mg/dL (ref 6–20)
CO2: 25 mmol/L (ref 22–32)
Calcium: 8.7 mg/dL — ABNORMAL LOW (ref 8.9–10.3)
Chloride: 109 mmol/L (ref 101–111)
Creatinine, Ser: 1.16 mg/dL — ABNORMAL HIGH (ref 0.44–1.00)
GFR, EST AFRICAN AMERICAN: 46 mL/min — AB (ref >60)
GFR, EST NON AFRICAN AMERICAN: 40 mL/min — AB (ref >60)
GLUCOSE: 96 mg/dL (ref 65–99)
POTASSIUM: 3.7 mmol/L (ref 3.5–5.1)
Sodium: 141 mmol/L (ref 135–145)

## 2016-04-14 LAB — ECHOCARDIOGRAM COMPLETE
Height: 70 in
Weight: 3389.79 oz

## 2016-04-14 LAB — GLUCOSE, CAPILLARY
Glucose-Capillary: 88 mg/dL (ref 65–99)
Glucose-Capillary: 104 mg/dL — ABNORMAL HIGH (ref 65–99)
Glucose-Capillary: 127 mg/dL — ABNORMAL HIGH (ref 65–99)

## 2016-04-14 MED ORDER — FUROSEMIDE 10 MG/ML IJ SOLN
40.0000 mg | Freq: Every day | INTRAMUSCULAR | Status: DC
  Administered 2016-04-14 – 2016-04-15 (×2): 40 mg via INTRAVENOUS
  Filled 2016-04-14 (×2): qty 4

## 2016-04-14 NOTE — Evaluation (Addendum)
Occupational Therapy Evaluation Patient Details Name: [name] MRN: [id number] DOB: [birth year] Today's Date: 04/14/2016    History of Present Illness 82 year old female with a history of paroxysmal atrial fibrillation, macular degeneration, HLD, LBBB, GERD, cholelithiasis, diabetes mellitus type 2, systolic CHF, hypertension presented with two-week history of dysuria and urinary frequency. The patient's symptoms worsened with increasing suprapubic pain for 5 days prior to admission with associated nausea. In addition, the patient complained of increasing generalized weakness to the point where she was having difficulty ambulating.   Clinical Impression   Pt admitted with above. Pt getting assist with ADLs, PTA. Feel pt will benefit from acute OT to increase independence and strength prior to d/c. Recommending SNF at this time and discussed with pt-pt concerned about insurance.    Follow Up Recommendations  SNF;Supervision/Assistance - 24 hour    Equipment Recommendations  3 in 1 bedside comode    Recommendations for Other Services       Precautions / Restrictions Precautions Precautions: Fall Restrictions Weight Bearing Restrictions: No      Mobility Bed Mobility               General bed mobility comments: not assessed  Transfers Overall transfer level: Needs assistance Equipment used: Rolling walker (2 wheeled) Transfers: Sit to/from Stand Sit to Stand: +2 physical assistance         General transfer comment: unable to stand pt fully upright and get both hands on RW with +1 assist. Tried using rocking technique. OT assisted pt in scooting towards edge of chair.    Balance    Unable to stand fully upright and get both hands on RW with +1 assist.                                        ADL Overall ADL's : Needs assistance/impaired                 Upper Body Dressing : Set up;Supervision/safety;Sitting   Lower Body  Dressing: Sitting/lateral leans;Moderate assistance   Toilet Transfer: RW; +2 physical assist needed; (unable to fully stand upright and get both hands on RW with +1 assist)            Functional mobility during ADLs: Rolling walker (unable to fully stand upright and get both hands on RW with +1 assist; needed +2 physical assist) General ADL Comments: Suggested pt use a grab bar or 3 in 1 over toilet instead of pulling on towel rack in her bathroom.     Vision  pt with history of macular degeneration   Perception     Praxis      Pertinent Vitals/Pain Pain Assessment: 0-10 Pain Score:  (1-2) Pain Location: Rt shoulder  Pain Descriptors / Indicators: Sore Pain Intervention(s): Monitored during session     Hand Dominance     Extremity/Trunk Assessment Upper Extremity Assessment Upper Extremity Assessment: Generalized weakness (in bilateral shoulder flexors)   Lower Extremity Assessment Lower Extremity Assessment: Defer to PT evaluation       Communication Communication Communication: No difficulties   Cognition Arousal/Alertness: Awake/alert Behavior During Therapy: WFL for tasks assessed/performed Overall Cognitive Status: No family/caregiver present to determine baseline cognitive functioning (decreased safety awareness)                     General Comments       Exercises  Shoulder Instructions      Home Living Family/patient expects to be discharged to:: Private residence (wants to return to Independent Living at [facility]/unsure) Living Arrangements: Alone Available Help at Discharge: Personal care attendant (daily aide for dressing; bath aide 3x/week) Type of Home: Independent living facility Home Access: Level entry     Home Layout: One level     Bathroom Shower/Tub: Producer, television/film/videoWalk-in shower   Bathroom Toilet: Handicapped height     Home Equipment: Environmental consultantWalker - 2 wheels;Shower seat (she may already have a wheelchair)          Prior  Functioning/Environment Level of Independence: Needs assistance  Gait / Transfers Assistance Needed: walks with RW to dining hall ADL's / Homemaking Assistance Needed: assist with bathing and dressing and shower transfer as well as cleaning        OT Diagnosis: Generalized weakness   OT Problem List: Decreased strength;Decreased range of motion;Decreased activity tolerance;Pain;Decreased knowledge of precautions;Decreased safety awareness;Decreased knowledge of use of DME or AE;Impaired balance (sitting and/or standing);Impaired vision/perception   OT Treatment/Interventions: Self-care/ADL training;Therapeutic exercise;DME and/or AE instruction;Energy conservation;Therapeutic activities;Cognitive remediation/compensation;Patient/family education;Balance training;Visual/perceptual remediation/compensation    OT Goals(Current goals can be found in the care plan section) Acute Rehab OT Goals Patient Stated Goal: pt wants to go back to [facility] OT Goal Formulation: With patient Time For Goal Achievement: 04/21/16 Potential to Achieve Goals: Good ADL Goals Pt Will Perform Grooming: with min guard assist;standing Pt Will Perform Lower Body Dressing: with min assist;with adaptive equipment;sit to/from stand Pt Will Transfer to Toilet: with min assist;ambulating;bedside commode Pt Will Perform Toileting - Clothing Manipulation and hygiene: with min assist;sit to/from stand  OT Frequency: Min 2X/week   Barriers to D/C:            Co-evaluation              End of Session Equipment Utilized During Treatment: Gait belt;Rolling walker Nurse Communication: Other (comment) (need +2 to get pt up)  Activity Tolerance: Other (comment) (unable to stand pt with +1 assist) Patient left: in chair;with call bell/phone within reach;with chair alarm set   Time: 1718-1736 OT Time Calculation (min): 18 min Charges:  OT General Charges $OT Visit: 1 Procedure OT Evaluation $OT Eval Moderate  Complexity: 1 Procedure G-CodesEarlie Raveling:    Juda Lajeunesse L OTR/L [phone] 04/14/2016, 5:59 PM

## 2016-04-14 NOTE — Progress Notes (Signed)
Echocardiogram 2D Echocardiogram has been performed.  [name], [name] 04/14/2016, 8:50 AM

## 2016-04-14 NOTE — Evaluation (Signed)
Physical Therapy Evaluation Patient Details Name: [name] MRN: [id number] DOB: Feb 23, 1923 Today's Date: 04/14/2016   History of Present Illness  82 year old female with a history of paroxysmal atrial fibrillation, diabetes mellitus type 2, systolic CHF, hypertension presented with two-week history of dysuria and urinary frequency. The patient's symptoms worsened with increasing suprapubic pain for 5 days prior to admission with associated nausea. In addition, the patient complained of increasing generalized weakness to the point where she was having difficulty ambulating.  Clinical Impression   Pt admitted with above diagnosis. Pt currently with functional limitations due to the deficits listed below (see PT Problem List).  Pt will benefit from skilled PT to increase their independence and safety with mobility to allow discharge to the venue listed below.    At this point, I believe consistent rehab at SNF level would benefit [name], allowing her to maximize independence and safety with mobility and ADLs prior to return to her Independent Living Apt; In our discussion, though, she indicated that her insurance will not help with cost of living at [facility] for  any days she is not at [facility] -- she therefore wants to return to [facility] as soon as possible; I am concerned for her safety managing alone there, though, and for her to return to [facility], I would want to maximize Home Health services, Aide services, and she may need to be more at a wheelchair functional level initially.     Follow Up Recommendations SNF;Other (comment) (Would like near 24 hour assistance if at all possible)    Equipment Recommendations  Rolling walker with 5" wheels;3in1 (PT);Wheelchair (measurements PT);Wheelchair cushion (measurements PT);Other (comment) (May already have; does she have a rollator?)    Recommendations for Other Services OT consult     Precautions / Restrictions  Precautions Precautions: Fall      Mobility  Bed Mobility Overal bed mobility: Needs Assistance Bed Mobility: Supine to Sit     Supine to sit: Supervision     General bed mobility comments: Inefficient movement getting to EOB, used rails, but not needing physical assist  Transfers Overall transfer level: Needs assistance Equipment used: Rolling walker (2 wheeled) Transfers: Sit to/from Stand Sit to Stand: Mod assist         General transfer comment: Heavy mod assist to power up; dependent on UE push and momentum  Ambulation/Gait Ambulation/Gait assistance: Mod assist Ambulation Distance (Feet): 40 Feet Assistive device: Rolling walker (2 wheeled) Gait Pattern/deviations: Step-through pattern;Decreased step length - right;Decreased step length - left;Trunk flexed     General Gait Details: Very weak, quite fatigued after going approx 20 feet; unsteady and heavily dependent on UE support on RW  Stairs            Wheelchair Mobility    Modified Rankin (Stroke Patients Only)       Balance Overall balance assessment: History of Falls;Needs assistance Sitting-balance support: Feet supported;Bilateral upper extremity supported Sitting balance-Leahy Scale: Good     Standing balance support: Bilateral upper extremity supported Standing balance-Leahy Scale: Poor                               Pertinent Vitals/Pain Pain Assessment: No/denies pain    Home Living Family/patient expects to be discharged to:: Private residence (Independent Living apartment at [facility]) Living Arrangements: Alone Available Help at Discharge: Personal care attendant (Daily Aide for dressing; Bath Aide 3x/week) Type of Home: Independent living facility Home Access:  Level entry     Home Layout: One level Home Equipment: Environmental consultantWalker - 2 wheels (She may already have a wheelchair)      Prior Function Level of Independence: Needs assistance   Gait / Transfers Assistance  Needed: walks with RW to dining hall  ADL's / Homemaking Assistance Needed: assist with bathing and dressing        Hand Dominance        Extremity/Trunk Assessment   Upper Extremity Assessment:  (Noted tic-like movements bil UEs)           Lower Extremity Assessment: Generalized weakness         Communication   Communication: No difficulties  Cognition Arousal/Alertness: Awake/alert Behavior During Therapy: WFL for tasks assessed/performed (became emotional/tearful when talking about dc plan) Overall Cognitive Status: Within Functional Limits for tasks assessed                      General Comments General comments (skin integrity, edema, etc.): Lengthy discussion re: pt's wishes with dc planning; she would rather stay at her apartment at [facility], and expressed concern that every day she is not there, insurance will pay less and less of the cost of living there (and it is expensive)    Exercises        Assessment/Plan    PT Assessment Patient needs continued PT services  PT Diagnosis Difficulty walking;Generalized weakness   PT Problem List Decreased strength;Decreased activity tolerance;Decreased balance;Decreased mobility;Decreased coordination;Decreased knowledge of use of DME;Decreased safety awareness  PT Treatment Interventions DME instruction;Gait training;Functional mobility training;Therapeutic activities;Therapeutic exercise;Balance training;Patient/family education;Wheelchair mobility training   PT Goals (Current goals can be found in the Care Plan section) Acute Rehab PT Goals Patient Stated Goal: get stronger and get back to her apartment PT Goal Formulation: With patient Time For Goal Achievement: 04/28/16 Potential to Achieve Goals: Good Additional Goals Additional Goal #1: Pt will manage wheelchair parts and propulsion independently    Frequency Min 3X/week   Barriers to discharge Decreased caregiver support Would like to see if  [name] can have any more Aide assistance    Co-evaluation               End of Session Equipment Utilized During Treatment: Gait belt Activity Tolerance: Patient tolerated treatment well (though quite fatigued upon return to room) Patient left: in chair;with call bell/phone within reach;with chair alarm set Nurse Communication: Mobility status         Time: [id number] PT Time Calculation (min) (ACUTE ONLY): 33 min   Charges:   PT Evaluation $PT Eval Moderate Complexity: 1 Procedure PT Treatments $Gait Training: 8-22 mins   PT G Codes:        Olen PelGarrigan, Bronnie Vasseur Hamff 04/14/2016, 1:14 PM  Van ClinesHolly Nihal Doan, [place]PT  Acute Rehabilitation Services Pager [phone] Office [phone]

## 2016-04-14 NOTE — Progress Notes (Addendum)
PROGRESS NOTE  [name] ZOX:[id number] DOB: 12/05/1922 DOA: 04/12/2016 PCP: [name], MD Brief History:  82 year old female with a history of paroxysmal atrial fibrillation, diabetes mellitus type 2, systolic CHF, hypertension presented with two-week history of dysuria and urinary frequency. The patient's symptoms worsened with increasing suprapubic pain for 5 days prior to admission with associated nausea. In addition, the patient complained of increasing generalized weakness to the point where she was having difficulty ambulating. She stated that her legs gave out earlier on the day of admission resulting in a mechanical fall. She saw her primary care provider on 04/10/2016. She was prescribed nitrofurantoin. However because of her nausea, she only took one days worth of the medications before stopping. Upon presentation, the patient was noted to have fever of 101.52F rectally with WBC 10.5 and pyuria. The patient was started on ceftriaxone empirically pending culture data.  The patient has also been complaining of dyspnea and weight gain. She states that she is chronically short of breath, but states that she has gained 5 pounds in the past week. She denies any chest discomfort, dizziness, syncope.  Assessment/Plan: Pyelonephritis -Continue ceftriaxone pending culture data -Lactic acid 1.0  Acute on chronic systolic and diastolic CHF -Previous echocardiograms revealed suppressed EF -Most recent echo on 03/02/2015 EF 50-55 percent, grade 1 DD -Patient states she has gained 5 pounds weighing herself at home in the past week  -Reviewed the medical record shows that she is approximately 22 pounds above her previous dry weight of 198 in October 2016  -Start intravenous furosemide and monitor renal function  -Daily weights  -strict I/O -Restarted lower dose carvedilol secondary to the patient's soft blood pressures  -Hold losartan in the setting of worsening serum  creatinine  -04/14/16 echo--EF 60-65%, grade 1 DD, no WMA  Paroxysmal atrial fibrillation -Not a candidate for anticoagulation due to frequent falls -Continue aspirin -CHADSVASc = 6 -Restarted low-dose carvedilol  Hyperlipidemia -Continue statin  depression -Continue mirtazapine and sertraline  Diabetes mellitus type 2 -Patient is not on any agents at this time -Hemoglobin A1c--pending -CBGs controlled-->d/c checks and ISS  LE pain and edema -duplex r/o DVT--neg  Gait instability/deconditioning -PT evaluation-->SNF   Disposition Plan: [place] 2-3 days--refusing SNF Family Communication:Family at beside updated 5/28--total time 35 min; >50% spent counseling and coordinating care  Consultants: none  Code Status: DNR  Subjective: Pt still c/o dyspnea on exertion.  Denies cp, n/v/d.  Still weak in legs and in general.  No f/c, cp, HA, neck pain  Objective: Filed Vitals:   04/13/16 1725 04/13/16 2001 04/14/16 0434 04/14/16 1026  BP: 125/50 119/58 121/61 108/60  Pulse: 76 72 68 72  Temp: 98 F (36.7 C) 99.1 F (37.3 C) 98.5 F (36.9 C) 98 F (36.7 C)  TempSrc: Oral Oral Oral Oral  Resp: 18 20 18 18   Height:      Weight:  96.1 kg (211 lb 13.8 oz)    SpO2: 96% 94% 92% 96%    Intake/Output Summary (Last 24 hours) at 04/14/16 1649 Last data filed at 04/14/16 1026  Gross per 24 hour  Intake    840 ml  Output    220 ml  Net    620 ml   Weight change: 5.834 kg (12 lb 13.8 oz) Exam:   General:  Pt is alert, follows commands appropriately, not in acute distress  HEENT: No icterus, No thrush, No neck mass, [place]/AT  Cardiovascular: RRR, S1/S2, no  rubs, no gallops  Respiratory: fine bibasilar crackles without wheeze.    Abdomen: Soft/+BS, non tender, non distended, no guarding  Extremities: 1+ LE edema, No lymphangitis, No petechiae, No rashes, no synovitis   Data Reviewed: I have personally reviewed following labs and imaging studies Basic  Metabolic Panel:  Recent Labs Lab 04/12/16 2228 04/13/16 0534 04/13/16 1203 04/14/16 0528  NA 137  --  139 141  K 3.6  --  4.0 3.7  CL 103  --  103 109  CO2 24  --  24 25  GLUCOSE 135*  --  103* 96  BUN 25*  --  22* 18  CREATININE 1.70* 1.49* 1.24* 1.16*  CALCIUM 8.6*  --  8.2* 8.7*   Liver Function Tests:  Recent Labs Lab 04/12/16 2228  AST 36  ALT 25  ALKPHOS 54  BILITOT 0.5  PROT 6.2*  ALBUMIN 3.0*   No results for input(s): LIPASE, AMYLASE in the last 168 hours. No results for input(s): AMMONIA in the last 168 hours. Coagulation Profile: No results for input(s): INR, PROTIME in the last 168 hours. CBC:  Recent Labs Lab 04/12/16 2228 04/13/16 0534  WBC 10.5 7.4  NEUTROABS 9.0*  --   HGB 11.2* 10.5*  HCT 33.2* 31.9*  MCV 88.8 89.4  PLT 139* 123*   Cardiac Enzymes: No results for input(s): CKTOTAL, CKMB, CKMBINDEX, TROPONINI in the last 168 hours. BNP: Invalid input(s): POCBNP CBG:  Recent Labs Lab 04/13/16 1152 04/13/16 1621 04/13/16 1959 04/14/16 0710 04/14/16 1153  GLUCAP 103* 115* 111* 88 104*   HbA1C: No results for input(s): HGBA1C in the last 72 hours. Urine analysis:    Component Value Date/Time   COLORURINE AMBER* 04/12/2016 2243   APPEARANCEUR CLOUDY* 04/12/2016 2243   LABSPEC 1.024 04/12/2016 2243   PHURINE 5.5 04/12/2016 2243   GLUCOSEU NEGATIVE 04/12/2016 2243   HGBUR SMALL* 04/12/2016 2243   BILIRUBINUR SMALL* 04/12/2016 2243   KETONESUR 15* 04/12/2016 2243   PROTEINUR NEGATIVE 04/12/2016 2243   UROBILINOGEN 0.2 06/21/2014 1924   NITRITE NEGATIVE 04/12/2016 2243   LEUKOCYTESUR LARGE* 04/12/2016 2243   Sepsis Labs: @LABRCNTIP (procalcitonin:4,lacticidven:4) ) Recent Results (from the past 240 hour(s))  Culture, Urine     Status: Abnormal (Preliminary result)   Collection Time: 04/12/16 10:43 PM  Result Value Ref Range Status   Specimen Description URINE, CATHETERIZED  Final   Special Requests NONE  Final   Culture  >=100,000 COLONIES/mL GRAM NEGATIVE RODS (A)  Final   Report Status PENDING  Incomplete  MRSA PCR Screening     Status: None   Collection Time: 04/13/16  1:53 AM  Result Value Ref Range Status   MRSA by PCR NEGATIVE NEGATIVE Final    Comment:        The GeneXpert MRSA Assay (FDA approved for NASAL specimens only), is one component of a comprehensive MRSA colonization surveillance program. It is not intended to diagnose MRSA infection nor to guide or monitor treatment for MRSA infections.      Scheduled Meds: . aspirin  81 mg Oral QPM  . carvedilol  3.125 mg Oral BID WC  . cefTRIAXone (ROCEPHIN) IVPB 1 gram/50 mL D5W  1 g Intravenous Q24H  . enoxaparin (LOVENOX) injection  40 mg Subcutaneous Daily  . furosemide  40 mg Intravenous Daily  . insulin aspart  0-5 Units Subcutaneous QHS  . insulin aspart  0-9 Units Subcutaneous TID WC  . mirtazapine  15 mg Oral QHS  . pantoprazole  40 mg  Oral Daily  . sertraline  50 mg Oral QHS  . simvastatin  40 mg Oral q1800   Continuous Infusions:   Procedures/Studies: Dg Chest 2 View  04/12/2016  CLINICAL DATA:  Cough and shortness of breath for 3 days. EXAM: CHEST  2 VIEW COMPARISON:  06/21/2014 FINDINGS: Heart at the upper limits of normal in size, however mildly increased from 2015. Atherosclerosis of the aortic arch. There are small bilateral pleural effusions. Mild increased interstitial opacities may reflect pulmonary edema. No confluent airspace disease to suggest pneumonia. The bones are under mineralized. IMPRESSION: Small pleural effusions. Mild increased interstitial opacities and borderline cardiomegaly. Findings suspicious for mild CHF. Electronically Signed   By: Rubye Oaks M.D.   On: 04/12/2016 22:34    Daysy Santini, DO  [facility] Pager [phone]  If 7PM-7AM, please contact night-coverage [email or web address] Password [email or web address] 04/14/2016, 4:49 PM   LOS: 1 day

## 2016-04-15 LAB — BASIC METABOLIC PANEL
ANION GAP: 8 (ref 5–15)
BUN: 16 mg/dL (ref 6–20)
CHLORIDE: 108 mmol/L (ref 101–111)
CO2: 27 mmol/L (ref 22–32)
Calcium: 9 mg/dL (ref 8.9–10.3)
Creatinine, Ser: 1.15 mg/dL — ABNORMAL HIGH (ref 0.44–1.00)
GFR, EST AFRICAN AMERICAN: 47 mL/min — AB (ref >60)
GFR, EST NON AFRICAN AMERICAN: 40 mL/min — AB (ref >60)
Glucose, Bld: 101 mg/dL — ABNORMAL HIGH (ref 65–99)
POTASSIUM: 3.7 mmol/L (ref 3.5–5.1)
SODIUM: 143 mmol/L (ref 135–145)

## 2016-04-15 LAB — URINE CULTURE

## 2016-04-15 LAB — GLUCOSE, CAPILLARY
GLUCOSE-CAPILLARY: 147 mg/dL — AB (ref 65–99)
GLUCOSE-CAPILLARY: 101 mg/dL — AB (ref 65–99)
GLUCOSE-CAPILLARY: 134 mg/dL — AB (ref 65–99)
Glucose-Capillary: 147 mg/dL — ABNORMAL HIGH (ref 65–99)

## 2016-04-15 LAB — MAGNESIUM: MAGNESIUM: 2.2 mg/dL (ref 1.7–2.4)

## 2016-04-15 MED ORDER — CEFUROXIME AXETIL 500 MG PO TABS
500.0000 mg | ORAL_TABLET | Freq: Two times a day (BID) | ORAL | Status: DC
  Administered 2016-04-16 – 2016-04-17 (×3): 500 mg via ORAL
  Filled 2016-04-15 (×3): qty 1

## 2016-04-15 MED ORDER — FUROSEMIDE 10 MG/ML IJ SOLN
40.0000 mg | Freq: Two times a day (BID) | INTRAMUSCULAR | Status: DC
  Administered 2016-04-16: 40 mg via INTRAVENOUS
  Filled 2016-04-15 (×3): qty 4

## 2016-04-15 MED ORDER — DEXTROSE 5 % IV SOLN
1.0000 g | INTRAVENOUS | Status: AC
  Administered 2016-04-15: 1 g via INTRAVENOUS
  Filled 2016-04-15: qty 10

## 2016-04-15 NOTE — [place] FL2 (Signed)
[place] MEDICAID FL2 LEVEL OF CARE SCREENING TOOL     IDENTIFICATION  Patient Name: [name] Birthdate: November 17, 1923 Sex: female Admission Date (Current Location): 04/12/2016  [facility] [place]   Facility and Address:  [facility], [address], [place], [place] [address]      Provider Number: [id number]  Attending Physician Name and Address:  Catarina Hartshornavid Tat, MD  Relative Name and Phone Number:  [name], niece, [phone]    Current Level of Care: SNF Recommended Level of Care: Skilled Nursing Facility Prior Approval Number:    Date Approved/Denied:   PASRR Number: [phone]  Discharge Plan: SNF    Current Diagnoses: Patient Active Problem List   Diagnosis Date Noted  . Normocytic anemia 04/13/2016  . Acute on chronic combined systolic and diastolic CHF (congestive heart failure) (HCC) 04/13/2016  . Pyelonephritis 04/12/2016  . Chronic combined systolic and diastolic congestive heart failure (HCC) 11/14/2013  . Constipation 11/14/2013  . Nausea 11/14/2013  . Influenza with respiratory manifestations 11/09/2013  . AKI (acute kidney injury) (HCC) 11/09/2013  . Influenza 11/07/2013  . Acute on chronic combined systolic and diastolic congestive heart failure (HCC) 11/07/2013  . Acute respiratory failure with hypoxia (HCC) 11/07/2013  . Fever 11/07/2013  . Generalized weakness 11/07/2013  . Peripheral neuropathy (HCC) 05/05/2012  . Closed left hip fracture (HCC) 05/04/2012  . Chronic systolic HF (heart failure) (HCC) 05/04/2012  . Arthritis 07/23/2011  . CHF (congestive heart failure) (HCC) 07/23/2011  . DYSPNEA 07/31/2010  . DEPRESSIVE DISORDER NOT ELSEWHERE CLASSIFIED 02/08/2009  . Type 2 diabetes mellitus with diabetic neuropathy, without long-term current use of insulin (HCC) 11/08/2008  . HYPERLIPIDEMIA-MIXED 11/08/2008  . Essential hypertension 11/08/2008  . LBBB (left bundle branch block) 11/08/2008   . Atrial fibrillation (HCC) 11/08/2008  . GERD 11/08/2008  . HEPATIC CYST 11/08/2008  . CHOLELITHIASIS 11/08/2008  . DILATION AND CURETTAGE, HX OF 11/08/2008    Orientation RESPIRATION BLADDER Height & Weight     Self, Time, Situation, Place  Normal Incontinent Weight: 96.1 kg (211 lb 13.8 oz) Height:  5\' 10"  (177.8 cm)  BEHAVIORAL SYMPTOMS/MOOD NEUROLOGICAL BOWEL NUTRITION STATUS      Continent Diet (Please see DC Summary)  AMBULATORY STATUS COMMUNICATION OF NEEDS Skin   Supervision Verbally Normal                       Personal Care Assistance Level of Assistance  Bathing, Feeding, Dressing Bathing Assistance: Maximum assistance Feeding assistance: Independent Dressing Assistance: Limited assistance     Functional Limitations Info             SPECIAL CARE FACTORS FREQUENCY  PT (By licensed PT), OT (By licensed OT)     PT Frequency: min 3x/week OT Frequency: min 2x/week            Contractures      Additional Factors Info  Code Status, Allergies, Psychotropic, Insulin Sliding Scale Code Status Info: DNR Allergies Info: Fosamax, Penicillins, Sulfonamide Derivatives, Tetanus Toxoid Psychotropic Info: Zoloft Insulin Sliding Scale Info: insulin aspart (novoLOG) injection 0-9 Units       Current Medications (04/15/2016):  This is the current hospital active medication list Current Facility-Administered Medications  Medication Dose Route Frequency Provider Last Rate Last Dose  . acetaminophen (TYLENOL) tablet 650 mg  650 mg Oral Q6H PRN Alberteen Samhristopher P Danford, MD   650 mg at 04/14/16 2112   Or  . acetaminophen (TYLENOL) suppository 650  mg  650 mg Rectal Q6H PRN Alberteen Sam, MD      . aspirin chewable tablet 81 mg  81 mg Oral QPM Alberteen Sam, MD   81 mg at 04/14/16 1738  . carvedilol (COREG) tablet 3.125 mg  3.125 mg Oral BID WC Catarina Hartshorn, MD   3.125 mg at 04/15/16 0835  . cefTRIAXone (ROCEPHIN) 1 g in dextrose 5 % 50 mL IVPB  1 g  Intravenous Q24H Marquita Palms, RPH   1 g at 04/14/16 2107  . diazepam (VALIUM) tablet 5-10 mg  5-10 mg Oral Q6H PRN Alberteen Sam, MD      . enoxaparin (LOVENOX) injection 40 mg  40 mg Subcutaneous Daily Belinda Fisher Stone, RPH   40 mg at 04/14/16 1022  . furosemide (LASIX) injection 40 mg  40 mg Intravenous Daily Catarina Hartshorn, MD   40 mg at 04/14/16 1543  . insulin aspart (novoLOG) injection 0-9 Units  0-9 Units Subcutaneous TID WC Alberteen Sam, MD   0 Units at 04/13/16 0816  . mirtazapine (REMERON) tablet 15 mg  15 mg Oral QHS Alberteen Sam, MD   15 mg at 04/14/16 2112  . ondansetron (ZOFRAN) tablet 4 mg  4 mg Oral Q6H PRN Alberteen Sam, MD       Or  . ondansetron (ZOFRAN) injection 4 mg  4 mg Intravenous Q6H PRN Alberteen Sam, MD      . pantoprazole (PROTONIX) EC tablet 40 mg  40 mg Oral Daily Alberteen Sam, MD   40 mg at 04/14/16 1022  . sertraline (ZOLOFT) tablet 50 mg  50 mg Oral QHS Alberteen Sam, MD   50 mg at 04/14/16 2116  . simvastatin (ZOCOR) tablet 40 mg  40 mg Oral q1800 Alberteen Sam, MD   40 mg at 04/14/16 1738     Discharge Medications: Please see discharge summary for a list of discharge medications.  Relevant Imaging Results:  Relevant Lab Results:   Additional Information SSN: [id number] [place], [place]

## 2016-04-15 NOTE — Clinical Social Work Note (Signed)
Clinical Social Work Assessment  Patient Details  Name: LAKEYSHA SLUTSKY MRN: [id number] Date of Birth: [birth year]  Date of referral:  04/15/16               Reason for consult:  Facility Placement                Permission sought to share information with:  Facility Sport and exercise psychologist, Family Supports Permission granted to share information::  Yes, Verbal Permission Granted  Name::     [place]::  [facility] SNFs  Relationship::  Niece  Contact Information:  [phone]  Housing/Transportation Living arrangements for the past 2 months:  Apartment Source of Information:  Patient Patient Interpreter Needed:  None Criminal Activity/Legal Involvement Pertinent to Current Situation/Hospitalization:  No - Comment as needed Significant Relationships:  Other Family Members Lives with:  Self Do you feel safe going back to the place where you live?  No Need for family participation in patient care:  Yes (Comment)  Care giving concerns:  CSW received referral for possible SNF placement at time of discharge. CSW met with patient regarding PT recommendation of SNF placement at time of discharge. Patient is pleasant and appears young for her age. Patient reports living alone and is currently unable to care for herself home because she feels weak. Patient expressed understanding of PT recommendation and is agreeable to SNF placement at time of discharge. CSW to continue to follow and assist with discharge planning needs.   Social Worker assessment / plan:  CSW spoke with patient concerning possibility of rehab at [facility] before returning home.  Employment status:  Retired Nurse, adult PT Recommendations:  [place] / Referral to community resources:  [place]  Patient/Family's Response to care: Patient recognizes need for rehab before returning home and is agreeable to a SNF in [place] [employer]. Patient  reported preference for [facility] or [place] where it will be close to her nice..  Patient/Family's Understanding of and Emotional Response to Diagnosis, Current Treatment, and Prognosis:  Patient is realistic regarding therapy needs. No questions/concerns about plan or treatment.    Emotional Assessment Appearance:  Appears younger than stated age Attitude/Demeanor/Rapport:  Other (Appropriate) Affect (typically observed):  Accepting, Adaptable, Pleasant, Appropriate Orientation:  Oriented to  Time, Oriented to Situation, Oriented to Place, Oriented to Self Alcohol / Substance use:  Not Applicable Psych involvement (Current and /or in the community):  No (Comment)  Discharge Needs  Concerns to be addressed:  Care Coordination Readmission within the last 30 days:  No Current discharge risk:  None Barriers to Discharge:  Continued Medical Work up   [employer], [place] 04/15/2016, 8:57 AM

## 2016-04-15 NOTE — Progress Notes (Signed)
PROGRESS NOTE  [name] ZOX:[id number] DOB: 05/25/1923 DOA: 04/12/2016 PCP: [name], MD Brief History:  82 year old female with a history of paroxysmal atrial fibrillation, diabetes mellitus type 2, systolic CHF, hypertension presented with two-week history of dysuria and urinary frequency. The patient's symptoms worsened with increasing suprapubic pain for 5 days prior to admission with associated nausea. In addition, the patient complained of increasing generalized weakness to the point where she was having difficulty ambulating. She stated that her legs gave out earlier on the day of admission resulting in a mechanical fall. She saw her primary care provider on 04/10/2016. She was prescribed nitrofurantoin. However because of her nausea, she only took one days worth of the medications before stopping. Upon presentation, the patient was noted to have fever of 101.24F rectally with WBC 10.5 and pyuria. The patient was started on ceftriaxone empirically pending culture data.  The patient has also been complaining of dyspnea and weight gain. She states that she is chronically short of breath, but states that she has gained 5 pounds in the past week. She denies any chest discomfort, dizziness, syncope.  Assessment/Plan: Pyelonephritis -Continue ceftriaxone through today -start cefuroxime bid 5/30 -Lactic acid 1.0 -urine culture = klebsiella  Acute on chronic systolic and diastolic CHF -Previous echocardiograms revealed suppressed EF -Most recent echo on 03/02/2015 EF 50-55 percent, grade 1 DD -Patient states she has gained 5 pounds weighing herself at home in the past week  -Reviewed the medical record shows that she is approximately 22 pounds above her previous dry weight of 198 in October 2016  -increase IV furosemide to bid -Daily weights -strict I/O--not accurate -Restarted lower dose carvedilol secondary to the patient's soft blood pressures  -Hold  losartan in the setting of worsening serum creatinine  -04/14/16 echo--EF 60-65%, grade 1 DD, no WMA  Paroxysmal atrial fibrillation -Not a candidate for anticoagulation due to frequent falls -Continue aspirin -CHADSVASc = 6 -Restarted low-dose carvedilol  Hyperlipidemia -Continue statin  depression -Continue mirtazapine and sertraline  Diabetes mellitus type 2 -Patient is not on any agents at this time -Hemoglobin A1c--pending -CBGs controlled-->d/c checks and ISS  LE pain and edema -duplex r/o DVT--neg  Gait instability/deconditioning -PT evaluation-->SNF   Disposition Plan: SNF 5/30 or 6/1 Family Communication:Family at beside updated 5/29--total time 35 min; >50% spent counseling and coordinating care  Consultants: none  Code Status: DNR  Subjective: Patient still has some dyspnea on exertion but denies any fevers, chills, headache, neck pain, chest pain, nausea, vomiting, diarrhea, abdominal pain patient feels stronger.  Objective: Filed Vitals:   04/14/16 2009 04/15/16 0423 04/15/16 1000 04/15/16 1700  BP: 130/99 138/67 106/73 130/61  Pulse: 71 75 73 76  Temp: 98.7 F (37.1 C) 97.8 F (36.6 C) 99 F (37.2 C) 98.7 F (37.1 C)  TempSrc: Oral Oral Oral Oral  Resp: 20 18 18 18   Height:      Weight:   96 kg (211 lb 10.3 oz)   SpO2: 96% 96% 98% 98%    Intake/Output Summary (Last 24 hours) at 04/15/16 1716 Last data filed at 04/15/16 1640  Gross per 24 hour  Intake    600 ml  Output      0 ml  Net    600 ml   Weight change:  Exam:   General:  Pt is alert, follows commands appropriately, not in acute distress  HEENT: No icterus, No thrush, No neck mass, [place]/AT  Cardiovascular: RRR, S1/S2,  no rubs, no gallops  Respiratory: CTA bilaterally, no wheezing, no crackles, no rhonchi  Abdomen: Soft/+BS, non tender, non distended, no guarding  Extremities: 1 + LE edema, No lymphangitis, No petechiae, No rashes, no synovitis   Data Reviewed: I  have personally reviewed following labs and imaging studies Basic Metabolic Panel:  Recent Labs Lab 04/12/16 2228 04/13/16 0534 04/13/16 1203 04/14/16 0528 04/15/16 0539  NA 137  --  139 141 143  K 3.6  --  4.0 3.7 3.7  CL 103  --  103 109 108  CO2 24  --  24 25 27   GLUCOSE 135*  --  103* 96 101*  BUN 25*  --  22* 18 16  CREATININE 1.70* 1.49* 1.24* 1.16* 1.15*  CALCIUM 8.6*  --  8.2* 8.7* 9.0  MG  --   --   --   --  2.2   Liver Function Tests:  Recent Labs Lab 04/12/16 2228  AST 36  ALT 25  ALKPHOS 54  BILITOT 0.5  PROT 6.2*  ALBUMIN 3.0*   No results for input(s): LIPASE, AMYLASE in the last 168 hours. No results for input(s): AMMONIA in the last 168 hours. Coagulation Profile: No results for input(s): INR, PROTIME in the last 168 hours. CBC:  Recent Labs Lab 04/12/16 2228 04/13/16 0534  WBC 10.5 7.4  NEUTROABS 9.0*  --   HGB 11.2* 10.5*  HCT 33.2* 31.9*  MCV 88.8 89.4  PLT 139* 123*   Cardiac Enzymes: No results for input(s): CKTOTAL, CKMB, CKMBINDEX, TROPONINI in the last 168 hours. BNP: Invalid input(s): POCBNP CBG:  Recent Labs Lab 04/14/16 1153 04/14/16 1625 04/15/16 1011 04/15/16 1153 04/15/16 1618  GLUCAP 104* 127* 147* 147* 101*   HbA1C: No results for input(s): HGBA1C in the last 72 hours. Urine analysis:    Component Value Date/Time   COLORURINE AMBER* 04/12/2016 2243   APPEARANCEUR CLOUDY* 04/12/2016 2243   LABSPEC 1.024 04/12/2016 2243   PHURINE 5.5 04/12/2016 2243   GLUCOSEU NEGATIVE 04/12/2016 2243   HGBUR SMALL* 04/12/2016 2243   BILIRUBINUR SMALL* 04/12/2016 2243   KETONESUR 15* 04/12/2016 2243   PROTEINUR NEGATIVE 04/12/2016 2243   UROBILINOGEN 0.2 06/21/2014 1924   NITRITE NEGATIVE 04/12/2016 2243   LEUKOCYTESUR LARGE* 04/12/2016 2243   Sepsis Labs: @LABRCNTIP (procalcitonin:4,lacticidven:4) ) Recent Results (from the past 240 hour(s))  Culture, Urine     Status: Abnormal   Collection Time: 04/12/16 10:43 PM    Result Value Ref Range Status   Specimen Description URINE, CATHETERIZED  Final   Special Requests NONE  Final   Culture >=100,000 COLONIES/mL KLEBSIELLA PNEUMONIAE (A)  Final   Report Status 04/15/2016 FINAL  Final   Organism ID, Bacteria KLEBSIELLA PNEUMONIAE (A)  Final      Susceptibility   Klebsiella pneumoniae - MIC*    AMPICILLIN >=32 RESISTANT Resistant     CEFAZOLIN <=4 SENSITIVE Sensitive     CEFTRIAXONE <=1 SENSITIVE Sensitive     CIPROFLOXACIN <=0.25 SENSITIVE Sensitive     GENTAMICIN <=1 SENSITIVE Sensitive     IMIPENEM <=0.25 SENSITIVE Sensitive     NITROFURANTOIN 64 INTERMEDIATE Intermediate     TRIMETH/SULFA <=20 SENSITIVE Sensitive     AMPICILLIN/SULBACTAM 4 SENSITIVE Sensitive     PIP/TAZO <=4 SENSITIVE Sensitive     * >=100,000 COLONIES/mL KLEBSIELLA PNEUMONIAE  MRSA PCR Screening     Status: None   Collection Time: 04/13/16  1:53 AM  Result Value Ref Range Status   MRSA by PCR NEGATIVE NEGATIVE  Final    Comment:        The GeneXpert MRSA Assay (FDA approved for NASAL specimens only), is one component of a comprehensive MRSA colonization surveillance program. It is not intended to diagnose MRSA infection nor to guide or monitor treatment for MRSA infections.      Scheduled Meds: . aspirin  81 mg Oral QPM  . carvedilol  3.125 mg Oral BID WC  . cefTRIAXone (ROCEPHIN) IVPB 1 gram/50 mL D5W  1 g Intravenous Q24H  . enoxaparin (LOVENOX) injection  40 mg Subcutaneous Daily  . furosemide  40 mg Intravenous BID  . insulin aspart  0-9 Units Subcutaneous TID WC  . mirtazapine  15 mg Oral QHS  . pantoprazole  40 mg Oral Daily  . sertraline  50 mg Oral QHS  . simvastatin  40 mg Oral q1800   Continuous Infusions:   Procedures/Studies: Dg Chest 2 View  04/12/2016  CLINICAL DATA:  Cough and shortness of breath for 3 days. EXAM: CHEST  2 VIEW COMPARISON:  06/21/2014 FINDINGS: Heart at the upper limits of normal in size, however mildly increased from 2015.  Atherosclerosis of the aortic arch. There are small bilateral pleural effusions. Mild increased interstitial opacities may reflect pulmonary edema. No confluent airspace disease to suggest pneumonia. The bones are under mineralized. IMPRESSION: Small pleural effusions. Mild increased interstitial opacities and borderline cardiomegaly. Findings suspicious for mild CHF. Electronically Signed   By: Rubye Oaks M.D.   On: 04/12/2016 22:34    Zinnia Tindall, DO  [facility] Pager [phone]  If 7PM-7AM, please contact night-coverage [email or web address] Password [email or web address] 04/15/2016, 5:16 PM   LOS: 2 days

## 2016-04-15 NOTE — Progress Notes (Signed)
Physical Therapy Treatment Patient Details Name: [name] MRN: [id number] DOB: Nov 13, 1923 Today's Date: 04/15/2016    History of Present Illness 82 year old female with a history of paroxysmal atrial fibrillation, macular degeneration, HLD, LBBB, GERD, cholelithiasis, diabetes mellitus type 2, systolic CHF, hypertension presented with two-week history of dysuria and urinary frequency. The patient's symptoms worsened with increasing suprapubic pain for 5 days prior to admission with associated nausea. In addition, the patient complained of increasing generalized weakness to the point where she was having difficulty ambulating.    PT Comments    In better spirits today and making progress with functional mobility and activity tolerance; continue to recommend SNF for post acute rehab to maximize independence and safety with mobility   Follow Up Recommendations  SNF;Supervision/Assistance - 24 hour     Equipment Recommendations  Rolling walker with 5" wheels;3in1 (PT);Wheelchair (measurements PT);Wheelchair cushion (measurements PT);Other (comment) (May already have; does she have a rollator?)    Recommendations for Other Services       Precautions / Restrictions Precautions Precautions: Fall    Mobility  Bed Mobility                  Transfers Overall transfer level: Needs assistance Equipment used: Rolling walker (2 wheeled) Transfers: Sit to/from Stand Sit to Stand: Mod assist;Min assist         General transfer comment: Requiring mod assist to power up with initial attempt at standing up; second sit to stand with better fitting recliner needing less assist  Ambulation/Gait Ambulation/Gait assistance: Min assist Ambulation Distance (Feet): 80 Feet Assistive device: Rolling walker (2 wheeled) Gait Pattern/deviations: Step-through pattern;Decreased step length - right;Decreased step length - left     General Gait Details: More upright posture; less  shaky and more upright initially with amb today; unsteadiness did increase towards the end of the walk with fatigue   Stairs            Wheelchair Mobility    Modified Rankin (Stroke Patients Only)       Balance Overall balance assessment: History of Falls                                  Cognition Arousal/Alertness: Awake/alert Behavior During Therapy: WFL for tasks assessed/performed Overall Cognitive Status: No family/caregiver present to determine baseline cognitive functioning (decreased safety awareness)                      Exercises      General Comments General comments (skin integrity, edema, etc.): Seems more amenable to SNF      Pertinent Vitals/Pain Pain Assessment: No/denies pain    Home Living                      Prior Function            PT Goals (current goals can now be found in the care plan section) Acute Rehab PT Goals Patient Stated Goal: pt wants to go back to [facility] PT Goal Formulation: With patient Time For Goal Achievement: 04/28/16 Potential to Achieve Goals: Good Progress towards PT goals: Progressing toward goals    Frequency  Min 3X/week    PT Plan Current plan remains appropriate    Co-evaluation             End of Session Equipment Utilized During Treatment: Gait belt Activity Tolerance: Patient tolerated treatment  well Patient left: in chair;with call bell/phone within reach;with chair alarm set     Time: 1529-1556 PT Time Calculation (min) (ACUTE ONLY): 27 min [id number] Charges:  $Gait Training: 8-22 mins $Therapeutic Activity: 8-22 mins                    G Codes:      Olen PelGarrigan, Zeniya Lapidus Hamff 04/15/2016, 4:45 PM  Van ClinesHolly Faithlynn Deeley, [place]  Acute Rehabilitation Services Pager [phone] Office [phone]

## 2016-04-15 NOTE — Clinical Social Work Placement (Signed)
   CLINICAL SOCIAL WORK PLACEMENT  NOTE  Date:  04/15/2016  Patient Details  Name: Kristen ArtisKatherine C Nicoll MRN: [id number] Date of Birth: Apr 04, 1923  Clinical Social Work is seeking post-discharge placement for this patient at the Skilled  Nursing Facility level of care (*CSW will initial, date and re-position this form in  chart as items are completed):      Patient/family provided with [facility] Clinical Social Work Department's list of facilities offering this level of care within the geographic area requested by the patient (or if unable, by the patient's family).      Patient/family informed of their freedom to choose among providers that offer the needed level of care, that participate in Medicare, Medicaid or managed care program needed by the patient, have an available bed and are willing to accept the patient.      Patient/family informed of [facility]'s ownership interest in [facility] and [facility], as well as of the fact that they are under no obligation to receive care at these facilities.  PASRR submitted to EDS on       PASRR number received on       Existing PASRR number confirmed on 04/15/16     FL2 transmitted to all facilities in geographic area requested by pt/family on 04/15/16     FL2 transmitted to all facilities within larger geographic area on       Patient informed that his/her managed care company has contracts with or will negotiate with certain facilities, including the following:            Patient/family informed of bed offers received.  Patient chooses bed at       Physician recommends and patient chooses bed at      Patient to be transferred to   on  .  Patient to be transferred to facility by       Patient family notified on   of transfer.  Name of family member notified:        PHYSICIAN Please sign FL2     Additional Comment:    _______________________________________________ Mearl LatinNadia S Tyson Masin, LCSWA 04/15/2016, 9:00 AM

## 2016-04-16 DIAGNOSIS — I4891 Unspecified atrial fibrillation: Secondary | ICD-10-CM

## 2016-04-16 LAB — BASIC METABOLIC PANEL
Anion gap: 9 (ref 5–15)
BUN: 16 mg/dL (ref 6–20)
CO2: 25 mmol/L (ref 22–32)
CREATININE: 1.01 mg/dL — AB (ref 0.44–1.00)
Calcium: 8.9 mg/dL (ref 8.9–10.3)
Chloride: 107 mmol/L (ref 101–111)
GFR calc Af Amer: 55 mL/min — ABNORMAL LOW (ref >60)
GFR, EST NON AFRICAN AMERICAN: 47 mL/min — AB (ref >60)
Glucose, Bld: 94 mg/dL (ref 65–99)
Potassium: 3.3 mmol/L — ABNORMAL LOW (ref 3.5–5.1)
SODIUM: 141 mmol/L (ref 135–145)

## 2016-04-16 LAB — GLUCOSE, CAPILLARY
GLUCOSE-CAPILLARY: 110 mg/dL — AB (ref 65–99)
GLUCOSE-CAPILLARY: 117 mg/dL — AB (ref 65–99)
Glucose-Capillary: 126 mg/dL — ABNORMAL HIGH (ref 65–99)
Glucose-Capillary: 85 mg/dL (ref 65–99)

## 2016-04-16 LAB — HEMOGLOBIN A1C
Hgb A1c MFr Bld: 6.3 % — ABNORMAL HIGH (ref 4.8–5.6)
MEAN PLASMA GLUCOSE: 134 mg/dL

## 2016-04-16 MED ORDER — CARVEDILOL 6.25 MG PO TABS
6.2500 mg | ORAL_TABLET | Freq: Two times a day (BID) | ORAL | Status: DC
  Administered 2016-04-16 – 2016-04-17 (×2): 6.25 mg via ORAL
  Filled 2016-04-16 (×2): qty 1

## 2016-04-16 MED ORDER — CEFUROXIME AXETIL 500 MG PO TABS: 500.0000 mg | ORAL_TABLET | Freq: Two times a day (BID) | ORAL | Status: DC

## 2016-04-16 MED ORDER — FUROSEMIDE 40 MG PO TABS
40.0000 mg | ORAL_TABLET | Freq: Every day | ORAL | Status: DC
  Administered 2016-04-17: 40 mg via ORAL
  Filled 2016-04-16: qty 1

## 2016-04-16 MED ORDER — CARVEDILOL 6.25 MG PO TABS: 6.2500 mg | ORAL_TABLET | Freq: Two times a day (BID) | ORAL | Status: DC

## 2016-04-16 MED ORDER — FUROSEMIDE 10 MG/ML IJ SOLN
40.0000 mg | Freq: Two times a day (BID) | INTRAMUSCULAR | Status: AC
  Administered 2016-04-16: 40 mg via INTRAVENOUS
  Filled 2016-04-16: qty 4

## 2016-04-16 MED ORDER — POTASSIUM CHLORIDE CRYS ER 20 MEQ PO TBCR
40.0000 meq | EXTENDED_RELEASE_TABLET | Freq: Once | ORAL | Status: AC
  Administered 2016-04-16: 40 meq via ORAL
  Filled 2016-04-16: qty 2

## 2016-04-16 MED ORDER — SPIRONOLACTONE 25 MG PO TABS
12.5000 mg | ORAL_TABLET | Freq: Every day | ORAL | Status: DC
  Administered 2016-04-17: 12.5 mg via ORAL
  Filled 2016-04-16: qty 1

## 2016-04-16 NOTE — Care Management Important Message (Signed)
Important Message  Patient Details  Name: [name] MRN: [id number] Date of Birth: 1922-11-25   Medicare Important Message Given:  Yes    [name], [name] 04/16/2016, 8:02 AM

## 2016-04-16 NOTE — Progress Notes (Signed)
Occupational Therapy Treatment Patient Details Name: Kristen ArtisKatherine C Mensing MRN: [id number] DOB: 1923-08-23 Today's Date: 04/16/2016    History of present illness 82 year old female with a history of paroxysmal atrial fibrillation, macular degeneration, HLD, LBBB, GERD, cholelithiasis, diabetes mellitus type 2, systolic CHF, hypertension presented with two-week history of dysuria and urinary frequency. The patient's symptoms worsened with increasing suprapubic pain for 5 days prior to admission with associated nausea. In addition, the patient complained of increasing generalized weakness to the point where she was having difficulty ambulating.   OT comments  Patient progressing slowly towards OT goals. She continues to need assistance with basic mobility and ADLs and would not be safe to return home alone. Continue to recommend SNF.   Follow Up Recommendations  SNF;Supervision/Assistance - 24 hour    Equipment Recommendations  3 in 1 bedside comode    Recommendations for Other Services      Precautions / Restrictions Precautions Precautions: Fall Restrictions Weight Bearing Restrictions: No       Mobility Bed Mobility               General bed mobility comments: not assessed -- up in recliner  Transfers Overall transfer level: Needs assistance Equipment used: Rolling walker (2 wheeled) Transfers: Sit to/from [employer]Stand;Stand Pivot Transfers Sit to Stand: Mod assist;From elevated surface Stand pivot transfers: Min assist       General transfer comment: mod A to power up; also with decreased eccentric control, tends to plop into recliner.    Balance                                   ADL Overall ADL's : Needs assistance/impaired     Grooming: Wash/dry hands;Wash/dry face;Oral care;Applying deodorant;Brushing hair;Set up;Sitting   Upper Body Bathing: Minimal assitance;Sitting   Lower Body Bathing: Moderate assistance;Sit to/from stand   Upper Body  Dressing : Minimal assistance;Sitting       Toilet Transfer: Moderate assistance;Cueing for safety;Stand-pivot;BSC;RW   Toileting- Clothing Manipulation and Hygiene: Moderate assistance;Sit to/from stand       Functional mobility during ADLs: Moderate assistance;Rolling walker;Cueing for safety        Vision                     Perception     Praxis      Cognition   Behavior During Therapy: WFL for tasks assessed/performed Overall Cognitive Status: Within Functional Limits for tasks assessed                       Extremity/Trunk Assessment               Exercises     Shoulder Instructions       General Comments      Pertinent Vitals/ Pain       Pain Assessment: No/denies pain  Home Living                                          Prior Functioning/Environment              Frequency Min 2X/week     Progress Toward Goals  OT Goals(current goals can now be found in the care plan section)  Progress towards OT goals: Progressing toward goals     Plan Discharge plan  remains appropriate    Co-evaluation                 End of Session Equipment Utilized During Treatment: Rolling walker   Activity Tolerance Patient tolerated treatment well   Patient Left in chair;with call bell/phone within reach   Nurse Communication          Time: 1610-9604 OT Time Calculation (min): 23 min  Charges: OT General Charges $OT Visit: 1 Procedure OT Treatments $Self Care/Home Management : 23-37 mins  Donna Snooks A 04/16/2016, 10:41 AM

## 2016-04-16 NOTE — Clinical Social Work Note (Signed)
Patient's facility preference is [facility]. Call made to Philmore PaliHeather Odom[facility] and [facility] for ST rehab.  Genelle BalVanessa Karnisha Lefebre, MSW, LCSW Licensed Clinical Social Worker Clinical Social Work Department [facility] [phone]

## 2016-04-16 NOTE — Progress Notes (Signed)
Patient ID: [name], female   DOB: 23-Jun-1923, 82 y.o.   MRN: [id number]  Kristen LeatherwoodKatherine is seen today for F/U of PAF, LBBB, DCM with EF 20% no CAD by cath in 2009 S/P right hip surgery from fall 2011 She has had some continued right hip pain and is taking hydrocodone for it. Her BP has been better on home readings. Average systolics have been 12-140 mmHg after increasing her Benzipril to 40mg  she has not had any SSCP, palpitations, or syncope. She has vertiginous symptoms with change in position She also has stable mild LE edema.  BP is fine   Hospitalized in December with Flu and sepsis Acute renal azotemia and PAF. [facility]  A week post d/c She is back on both and [name] has rechecked BMET and patient indicates its ok   Still with some edema and dyspnea Has to take extra lasix frequently  Isomnia despite lucentis  Chronic balance and arthritis issues   [facility] [place]   Last echo 02/2015 reviewed  EF 50-55%   Very emotional about insurance issues with long term care at [facility] Has another review of benefits in December  ROS: Denies fever, malais, weight loss, blurry vision, decreased visual acuity, cough, sputum, SOB, hemoptysis, pleuritic pain, palpitaitons, heartburn, abdominal pain, melena, lower extremity edema, claudication, or rash.  All other systems reviewed and negative  General: Affect appropriate Healthy:  appears stated age HEENT: normal Neck supple with no adenopathy JVP normal no bruits no thyromegaly Lungs clear with no wheezing and good diaphragmatic motion Heart:  S1/S2 SEM  murmur, no rub, gallop or click PMI normal Abdomen: benighn, BS positve, no tenderness, no AAA no bruit.  No HSM or HJR Distal pulses intact with no bruits No edema Neuro non-focal Skin warm and dry Weak LEls with walker    No current facility-administered medications for this visit.   No current outpatient prescriptions on  file.   Facility-Administered Medications Ordered in Other Visits  Medication Dose Route Frequency Provider Last Rate Last Dose  . acetaminophen (TYLENOL) tablet 650 mg  650 mg Oral Q6H PRN Alberteen Samhristopher P Danford, MD   650 mg at 04/14/16 2112   Or  . acetaminophen (TYLENOL) suppository 650 mg  650 mg Rectal Q6H PRN Alberteen Samhristopher P Danford, MD      . aspirin chewable tablet 81 mg  81 mg Oral QPM Alberteen Samhristopher P Danford, MD   81 mg at 04/15/16 1753  . carvedilol (COREG) tablet 3.125 mg  3.125 mg Oral BID WC Catarina Hartshornavid Tat, MD   3.125 mg at 04/15/16 1753  . cefUROXime (CEFTIN) tablet 500 mg  500 mg Oral BID WC Catarina Hartshornavid Tat, MD      . diazepam (VALIUM) tablet 5-10 mg  5-10 mg Oral Q6H PRN Alberteen Samhristopher P Danford, MD      . enoxaparin (LOVENOX) injection 40 mg  40 mg Subcutaneous Daily Belinda Fisheraylor P Stone, RPH   40 mg at 04/15/16 0947  . furosemide (LASIX) injection 40 mg  40 mg Intravenous BID Catarina Hartshornavid Tat, MD   0 mg at 04/15/16 2000  . insulin aspart (novoLOG) injection 0-9 Units  0-9 Units Subcutaneous TID WC Alberteen Samhristopher P Danford, MD   1 Units at 04/15/16 1238  . mirtazapine (REMERON) tablet 15 mg  15 mg Oral QHS Alberteen Samhristopher P Danford, MD   15 mg at 04/15/16 2116  . ondansetron (ZOFRAN) tablet 4 mg  4 mg Oral  Q6H PRN Alberteen Sam, MD       Or  . ondansetron (ZOFRAN) injection 4 mg  4 mg Intravenous Q6H PRN Alberteen Sam, MD      . pantoprazole (PROTONIX) EC tablet 40 mg  40 mg Oral Daily Alberteen Sam, MD   40 mg at 04/15/16 0947  . sertraline (ZOLOFT) tablet 50 mg  50 mg Oral QHS Alberteen Sam, MD   50 mg at 04/15/16 2113  . simvastatin (ZOCOR) tablet 40 mg  40 mg Oral q1800 Alberteen Sam, MD   40 mg at 04/15/16 1753    Allergies  Fosamax; Penicillins; Sulfonamide derivatives; and Tetanus toxoid  Electrocardiogram:  06/22/14  SR LBBB PR 236   Assessment and Plan DCM:  Euvolemic echo 02/2015 EF 50-55% continue current medicines  PAF:  maint NSR  No pal;pitations no  anticoagulation due to falls LBBB:  Stable chronic no high grade AV block Chol:  On statin  Depression:  Crying on valium f/u primary   F/U with me in 6 months  This encounter was created in error - please disregard.

## 2016-04-16 NOTE — Progress Notes (Signed)
Physical Therapy Treatment Patient Details Name: Kristen ArtisKatherine C Jimenez MRN: [id number] DOB: [birth year] Today's Date: 04/16/2016    History of Present Illness 82 year old female with a history of paroxysmal atrial fibrillation, macular degeneration, HLD, LBBB, GERD, cholelithiasis, diabetes mellitus type 2, systolic CHF, hypertension presented with two-week history of dysuria and urinary frequency. The patient's symptoms worsened with increasing suprapubic pain for 5 days prior to admission with associated nausea. In addition, the patient complained of increasing generalized weakness to the point where she was having difficulty ambulating.    PT Comments    Continuing progress with functional mobility; difficulty with power up to transfer to standing; needs strengthening; will also benefit from a closer look at her vestibular function; continue to recommend SNF for post-acute therapy  Follow Up Recommendations  SNF;Supervision/Assistance - 24 hour     Equipment Recommendations  Rolling walker with 5" wheels;3in1 (PT);Wheelchair (measurements PT);Wheelchair cushion (measurements PT);Other (comment)    Recommendations for Other Services       Precautions / Restrictions Precautions Precautions: Fall Restrictions Weight Bearing Restrictions: No    Mobility  Bed Mobility               General bed mobility comments: not assessed -- up in recliner  Transfers Overall transfer level: Needs assistance Equipment used: Rolling walker (2 wheeled) Transfers: Sit to/from Stand Sit to Stand: Min assist Stand pivot transfers: Min assist       General transfer comment: min A to power up; also with decreased eccentric control, tends to plop into recliner; continued dependence on momentum  Ambulation/Gait Ambulation/Gait assistance: Min assist Ambulation Distance (Feet): 90 Feet Assistive device: Rolling walker (2 wheeled) Gait Pattern/deviations: Step-through pattern;Decreased stride  length     General Gait Details: More upright posture with cues; less shaky and more upright initially with amb today; unsteadiness did increase towards the end of the walk with fatigue; lowered the RW for more optimal fit   Stairs            Wheelchair Mobility    Modified Rankin (Stroke Patients Only)       Balance                                    Cognition Arousal/Alertness: Awake/alert Behavior During Therapy: WFL for tasks assessed/performed Overall Cognitive Status: Within Functional Limits for tasks assessed                      Exercises Other Exercises Other Exercises: Chair push ups X5    General Comments        Pertinent Vitals/Pain Pain Assessment: No/denies pain    Home Living                      Prior Function            PT Goals (current goals can now be found in the care plan section) Acute Rehab PT Goals Patient Stated Goal: pt wants to go back to [facility] PT Goal Formulation: With patient Time For Goal Achievement: 04/28/16 Potential to Achieve Goals: Good Progress towards PT goals: Progressing toward goals    Frequency  Min 3X/week    PT Plan Current plan remains appropriate    Co-evaluation             End of Session Equipment Utilized During Treatment: Gait belt Activity Tolerance: Patient tolerated treatment well Patient  left: in chair;with call bell/phone within reach;with chair alarm set     Time: 1000-1017 PT Time Calculation (min) (ACUTE ONLY): 17 min  Charges:  $Gait Training: 8-22 mins                    G Codes:      [name] 04/16/2016, 1:01 PM  [name], [name]  Acute Rehabilitation Services Pager [phone] Office [phone]

## 2016-04-16 NOTE — Discharge Summary (Signed)
Physician Discharge Summary  [name]:[id number] DOB: [birth year] DOA: 04/12/2016  PCP: [name], MD  Admit date: 04/12/2016 Discharge date: 04/17/16  Admitted From: [facility] Disposition:  [facility]  Recommendations for Outpatient Follow-up:  1. Follow up with PCP in 1-2 weeks 2. Please obtain BMP one week   Home Health: NO Equipment/Devices:Non  Discharge Condition: Stable CODE STATUS: FULL Diet recommendation: Heart Healthy   Brief/Interim Summary 82 year old female with a history of paroxysmal atrial fibrillation, diabetes mellitus type 2, systolic CHF, hypertension presented with two-week history of dysuria and urinary frequency. The patient's symptoms worsened with increasing suprapubic pain for 5 days prior to admission with associated nausea. In addition, the patient complained of increasing generalized weakness to the point where she was having difficulty ambulating. She stated that her legs gave out earlier on the day of admission resulting in a mechanical fall. She saw her primary care provider on 04/10/2016. She was prescribed nitrofurantoin. However because of her nausea, she only took one days worth of the medications before stopping. Her progressive weakness progressed to the point of having difficulty getting out of bed.  Upon presentation, the patient was noted to have fever of 101.66F rectally with WBC 10.5 and pyuria. The patient was started on ceftriaxone empirically pending culture data.  She defervesced and clinically improved. She was de-escalated to cefuroxime once cultures returned for 5 more days after d/c.  The patient has also been complaining of dyspnea and weight gain. She states that she is chronically short of breath, but states that she has gained 5 pounds in the past week. She denies any chest discomfort, dizziness, syncope.  She was started on IV lasix with clinicaly improvement.  Discharge Diagnoses:  Pyelonephritis -Continue  ceftriaxone through 5/29 -start cefuroxime bid 5/30 x 6 more days -Lactic acid 1.0 -urine culture = klebsiella  Acute on chronic systolic and diastolic CHF -Previous echocardiograms revealed suppressed EF -Most recent echo on 03/02/2015 EF 50-55 percent, grade 1 DD -Patient states she has gained 5 pounds weighing herself at home in the past week  -Reviewed the medical record shows that she is approximately 22 pounds above her previous dry weight of 198 in October 2016  -increased IV furosemide to bid-->po lasix -Daily weights--??accuracy -strict I/O--not accurate -Restarted lower dose carvedilol secondary to the patient's soft blood pressures  -Hold losartan in the setting of worsening serum creatinine  -04/14/16 echo--EF 60-65%, grade 1 DD, no WMA -restart aldactone   Paroxysmal atrial fibrillation -Not a candidate for anticoagulation due to frequent falls -Continue aspirin -CHADSVASc = 6 -Increase dose of carvedilol  Hyperlipidemia -Continue statin  depression -Continue mirtazapine and sertraline  Diabetes mellitus type 2 -Patient is not on any agents at this time -Hemoglobin A1c--6.3 -CBGs controlled-->d/c checks and ISS  LE pain and edema -duplex r/o DVT--neg  Gait instability/deconditioning -PT evaluation-->SNF   Discharge Instructions     Medication List    STOP taking these medications        losartan 50 MG tablet  Commonly known as:  COZAAR     NORCO 5-325 MG tablet  Generic drug:  HYDROcodone-acetaminophen      TAKE these medications        acetaminophen 500 MG tablet  Commonly known as:  TYLENOL  Take 500 mg by mouth every 6 (six) hours as needed for pain.     aspirin 81 MG chewable tablet  Chew 81 mg by mouth every evening.     carvedilol 6.25 MG tablet  Commonly known  as:  COREG  Take 1 tablet (6.25 mg total) by mouth 2 (two) times daily with a meal.     cefUROXime 500 MG tablet  Commonly known as:  CEFTIN  Take 1 tablet (500 mg  total) by mouth 2 (two) times daily with a meal.     diazepam 5 MG tablet  Commonly known as:  VALIUM  Take 5-10 mg by mouth every 6 (six) hours as needed for anxiety or sedation.     furosemide 40 MG tablet  Commonly known as:  LASIX  Take 1 tablet (40 mg total) by mouth daily.     magnesium hydroxide 400 MG/5ML suspension  Commonly known as:  MILK OF MAGNESIA  Take 30 mLs by mouth daily as needed for mild constipation (constipation).     mirtazapine 15 MG tablet  Commonly known as:  REMERON  Take 15 mg by mouth at bedtime.     omeprazole 20 MG capsule  Commonly known as:  PRILOSEC  Take 20 mg by mouth daily.     PRESERVISION AREDS PO  Take 1 tablet by mouth daily.     sertraline 50 MG tablet  Commonly known as:  ZOLOFT  Take 50 mg by mouth at bedtime.     simvastatin 40 MG tablet  Commonly known as:  ZOCOR  Take 1 tablet (40 mg total) by mouth daily.     spironolactone 25 MG tablet  Commonly known as:  ALDACTONE  Take 0.5 tablets (12.5 mg total) by mouth daily.        Allergies  Allergen Reactions  . Fosamax [Alendronate Sodium] Swelling  . Penicillins Swelling  . Sulfonamide Derivatives Swelling  . Tetanus Toxoid Swelling    Consultations:    Procedures/Studies: Dg Chest 2 View  04/12/2016  CLINICAL DATA:  Cough and shortness of breath for 3 days. EXAM: CHEST  2 VIEW COMPARISON:  06/21/2014 FINDINGS: Heart at the upper limits of normal in size, however mildly increased from 2015. Atherosclerosis of the aortic arch. There are small bilateral pleural effusions. Mild increased interstitial opacities may reflect pulmonary edema. No confluent airspace disease to suggest pneumonia. The bones are under mineralized. IMPRESSION: Small pleural effusions. Mild increased interstitial opacities and borderline cardiomegaly. Findings suspicious for mild CHF. Electronically Signed   By: Rubye Oaks M.D.   On: 04/12/2016 22:34        Discharge Exam: Filed Vitals:     04/16/16 0945 04/16/16 1606  BP: 136/59 143/69  Pulse: 74 74  Temp: 98.8 F (37.1 C) 98.3 F (36.8 C)  Resp: 18 19   Filed Vitals:   04/15/16 1952 04/16/16 0357 04/16/16 0945 04/16/16 1606  BP: 126/62 150/72 136/59 143/69  Pulse: 67 74 74 74  Temp: 98.5 F (36.9 C) 97.7 F (36.5 C) 98.8 F (37.1 C) 98.3 F (36.8 C)  TempSrc: Oral Oral Oral Oral  Resp: Height:      Weight: 95.4 kg (210 lb 5.1 oz)     SpO2: 97% 95% 99% 100%    General: Pt is alert, awake, not in acute distress Cardiovascular: RRR, S1/S2 +, no rubs, no gallops Respiratory: CTA bilaterally, no wheezing, no rhonchi Abdominal: Soft, NT, ND, bowel sounds + Extremities: trace LE edema, no cyanosis   The results of significant diagnostics from this hospitalization (including imaging, microbiology, ancillary and laboratory) are listed below for reference.    Significant Diagnostic Studies: Dg Chest 2 View  04/12/2016  CLINICAL DATA:  Cough  and shortness of breath for 3 days. EXAM: CHEST  2 VIEW COMPARISON:  06/21/2014 FINDINGS: Heart at the upper limits of normal in size, however mildly increased from 2015. Atherosclerosis of the aortic arch. There are small bilateral pleural effusions. Mild increased interstitial opacities may reflect pulmonary edema. No confluent airspace disease to suggest pneumonia. The bones are under mineralized. IMPRESSION: Small pleural effusions. Mild increased interstitial opacities and borderline cardiomegaly. Findings suspicious for mild CHF. Electronically Signed   By: Rubye OaksMelanie  Ehinger M.D.   On: 04/12/2016 22:34     Microbiology: Recent Results (from the past 240 hour(s))  Culture, Urine     Status: Abnormal   Collection Time: 04/12/16 10:43 PM  Result Value Ref Range Status   Specimen Description URINE, CATHETERIZED  Final   Special Requests NONE  Final   Culture >=100,000 COLONIES/mL KLEBSIELLA PNEUMONIAE (A)  Final   Report Status 04/15/2016 FINAL  Final    Organism ID, Bacteria KLEBSIELLA PNEUMONIAE (A)  Final      Susceptibility   Klebsiella pneumoniae - MIC*    AMPICILLIN >=32 RESISTANT Resistant     CEFAZOLIN <=4 SENSITIVE Sensitive     CEFTRIAXONE <=1 SENSITIVE Sensitive     CIPROFLOXACIN <=0.25 SENSITIVE Sensitive     GENTAMICIN <=1 SENSITIVE Sensitive     IMIPENEM <=0.25 SENSITIVE Sensitive     NITROFURANTOIN 64 INTERMEDIATE Intermediate     TRIMETH/SULFA <=20 SENSITIVE Sensitive     AMPICILLIN/SULBACTAM 4 SENSITIVE Sensitive     PIP/TAZO <=4 SENSITIVE Sensitive     * >=100,000 COLONIES/mL KLEBSIELLA PNEUMONIAE  MRSA PCR Screening     Status: None   Collection Time: 04/13/16  1:53 AM  Result Value Ref Range Status   MRSA by PCR NEGATIVE NEGATIVE Final    Comment:        The GeneXpert MRSA Assay (FDA approved for NASAL specimens only), is one component of a comprehensive MRSA colonization surveillance program. It is not intended to diagnose MRSA infection nor to guide or monitor treatment for MRSA infections.      Labs: Basic Metabolic Panel:  Recent Labs Lab 04/12/16 2228 04/13/16 0534 04/13/16 1203 04/14/16 0528 04/15/16 0539 04/16/16 0401  NA 137  --  139 141 143 141  K 3.6  --  4.0 3.7 3.7 3.3*  CL 103  --  103 109 108 107  CO2 24  --  24 25 27 25   GLUCOSE 135*  --  103* 96 101* 94  BUN 25*  --  22* 18 16 16   CREATININE 1.70* 1.49* 1.24* 1.16* 1.15* 1.01*  CALCIUM 8.6*  --  8.2* 8.7* 9.0 8.9  MG  --   --   --   --  2.2  --    Liver Function Tests:  Recent Labs Lab 04/12/16 2228  AST 36  ALT 25  ALKPHOS 54  BILITOT 0.5  PROT 6.2*  ALBUMIN 3.0*   No results for input(s): LIPASE, AMYLASE in the last 168 hours. No results for input(s): AMMONIA in the last 168 hours. CBC:  Recent Labs Lab 04/12/16 2228 04/13/16 0534  WBC 10.5 7.4  NEUTROABS 9.0*  --   HGB 11.2* 10.5*  HCT 33.2* 31.9*  MCV 88.8 89.4  PLT 139* 123*   Cardiac Enzymes: No results for input(s): CKTOTAL, CKMB, CKMBINDEX,  TROPONINI in the last 168 hours. BNP: Invalid input(s): POCBNP CBG:  Recent Labs Lab 04/15/16 1618 04/15/16 1949 04/16/16 0729 04/16/16 1136 04/16/16 1705  GLUCAP 101* 134* 110* 126*  85    Time coordinating discharge:  Greater than 30 minutes  Signed:  Micheala Morissette, DO [facility] Pager: [phone] 04/16/2016, 6:42 PM

## 2016-04-17 ENCOUNTER — Ambulatory Visit: Payer: Commercial Managed Care - HMO | Admitting: Cardiovascular Disease

## 2016-04-17 DIAGNOSIS — R296 Repeated falls: Secondary | ICD-10-CM | POA: Diagnosis not present

## 2016-04-17 DIAGNOSIS — F322 Major depressive disorder, single episode, severe without psychotic features: Secondary | ICD-10-CM | POA: Diagnosis not present

## 2016-04-17 DIAGNOSIS — S92909A Unspecified fracture of unspecified foot, initial encounter for closed fracture: Secondary | ICD-10-CM | POA: Diagnosis not present

## 2016-04-17 DIAGNOSIS — E119 Type 2 diabetes mellitus without complications: Secondary | ICD-10-CM | POA: Diagnosis not present

## 2016-04-17 DIAGNOSIS — K7689 Other specified diseases of liver: Secondary | ICD-10-CM | POA: Diagnosis not present

## 2016-04-17 DIAGNOSIS — I480 Paroxysmal atrial fibrillation: Secondary | ICD-10-CM | POA: Diagnosis not present

## 2016-04-17 DIAGNOSIS — I447 Left bundle-branch block, unspecified: Secondary | ICD-10-CM | POA: Diagnosis not present

## 2016-04-17 DIAGNOSIS — N12 Tubulo-interstitial nephritis, not specified as acute or chronic: Secondary | ICD-10-CM | POA: Diagnosis not present

## 2016-04-17 DIAGNOSIS — I10 Essential (primary) hypertension: Secondary | ICD-10-CM | POA: Diagnosis not present

## 2016-04-17 DIAGNOSIS — S72009A Fracture of unspecified part of neck of unspecified femur, initial encounter for closed fracture: Secondary | ICD-10-CM | POA: Diagnosis not present

## 2016-04-17 DIAGNOSIS — K219 Gastro-esophageal reflux disease without esophagitis: Secondary | ICD-10-CM | POA: Diagnosis not present

## 2016-04-17 DIAGNOSIS — H3530 Unspecified macular degeneration: Secondary | ICD-10-CM | POA: Diagnosis not present

## 2016-04-17 DIAGNOSIS — I509 Heart failure, unspecified: Secondary | ICD-10-CM | POA: Diagnosis not present

## 2016-04-17 DIAGNOSIS — R2681 Unsteadiness on feet: Secondary | ICD-10-CM | POA: Diagnosis not present

## 2016-04-17 DIAGNOSIS — F329 Major depressive disorder, single episode, unspecified: Secondary | ICD-10-CM | POA: Diagnosis not present

## 2016-04-17 DIAGNOSIS — M6281 Muscle weakness (generalized): Secondary | ICD-10-CM | POA: Diagnosis not present

## 2016-04-17 LAB — BASIC METABOLIC PANEL
Anion gap: 9 (ref 5–15)
BUN: 18 mg/dL (ref 6–20)
CO2: 25 mmol/L (ref 22–32)
Calcium: 9.1 mg/dL (ref 8.9–10.3)
Chloride: 107 mmol/L (ref 101–111)
Creatinine, Ser: 0.92 mg/dL (ref 0.44–1.00)
GFR calc Af Amer: >60 mL/min (ref >60)
GFR, EST NON AFRICAN AMERICAN: 53 mL/min — AB (ref >60)
GLUCOSE: 100 mg/dL — AB (ref 65–99)
POTASSIUM: 3.5 mmol/L (ref 3.5–5.1)
Sodium: 141 mmol/L (ref 135–145)

## 2016-04-17 LAB — GLUCOSE, CAPILLARY
GLUCOSE-CAPILLARY: 102 mg/dL — AB (ref 65–99)
GLUCOSE-CAPILLARY: 132 mg/dL — AB (ref 65–99)

## 2016-04-17 LAB — MAGNESIUM: Magnesium: 2 mg/dL (ref 1.7–2.4)

## 2016-04-17 NOTE — Clinical Social Work Placement (Addendum)
   CLINICAL SOCIAL WORK PLACEMENT  NOTE 04/17/16 - DISCHARGED TO [facility]  Date:  04/17/2016  Patient Details  Name: [name] MRN: [id number] Date of Birth: Apr 21, 1923  Clinical Social Work is seeking post-discharge placement for this patient at the Skilled  Nursing Facility level of care (*CSW will initial, date and re-position this form in  chart as items are completed):      Patient/family provided with [facility]'s list of facilities offering this level of care within the geographic area requested by the patient (or if unable, by the patient's family).      Patient/family informed of their freedom to choose among providers that offer the needed level of care, that participate in Medicare, Medicaid or managed care program needed by the patient, have an available bed and are willing to accept the patient.      Patient/family informed of [facility]'s ownership interest in [facility] and [facility], as well as of the fact that they are under no obligation to receive care at these facilities.  PASRR submitted to EDS on       PASRR number received on       Existing PASRR number confirmed on 04/15/16     FL2 transmitted to all facilities in geographic area requested by pt/family on 04/15/16     FL2 transmitted to all facilities within larger geographic area on       Patient informed that his/her managed care company has contracts with or will negotiate with certain facilities, including the following:         Yes - Patient/family informed of bed offers received.  [facility] [place]     Physician recommends and patient chooses bed at      Patient to be transferred to  [facility] on  04/17/16.  Patient to be transferred to facility by  ambulance     Patient family notified on  04/17/16 of transfer.  Name of family member notified:   Nils Pylestelle Pollard - [phone]- [phone]-cell. Call made and  message left regarding discharge.    PHYSICIAN Please sign FL2     Additional Comment:    _______________________________________________ Cristobal Goldmannrawford, Alias Villagran Bradley, LCSW 04/17/2016, 3:19 PM

## 2016-04-17 NOTE — Progress Notes (Addendum)
Patient seen and examined.  She is feeling wheezy but overall her swelling has improved.  On exam, IRRR, wheezing bilaterally, trace ankle edema.  No changes to medications or discharge instructions, however, patient is actually DNR, not full code.  I have filled out the gold DNR form to accompany her chart.  I also verified that Klebsiella was sensitive to prescribed antibiotic.  Safe for discharge to SNF today when bed available.

## 2016-04-17 NOTE — Progress Notes (Signed)
[name] to be D/C'd Skilled nursing facility per MD order.  Discussed prescriptions and follow up appointments with the patient. Prescriptions given to patient, medication list explained in detail. Pt verbalized understanding.    Medication List    STOP taking these medications        losartan 50 MG tablet  Commonly known as:  COZAAR     NORCO 5-325 MG tablet  Generic drug:  HYDROcodone-acetaminophen      TAKE these medications        acetaminophen 500 MG tablet  Commonly known as:  TYLENOL  Take 500 mg by mouth every 6 (six) hours as needed for pain.     aspirin 81 MG chewable tablet  Chew 81 mg by mouth every evening.     carvedilol 6.25 MG tablet  Commonly known as:  COREG  Take 1 tablet (6.25 mg total) by mouth 2 (two) times daily with a meal.     cefUROXime 500 MG tablet  Commonly known as:  CEFTIN  Take 1 tablet (500 mg total) by mouth 2 (two) times daily with a meal.     diazepam 5 MG tablet  Commonly known as:  VALIUM  Take 5-10 mg by mouth every 6 (six) hours as needed for anxiety or sedation.     furosemide 40 MG tablet  Commonly known as:  LASIX  Take 1 tablet (40 mg total) by mouth daily.     magnesium hydroxide 400 MG/5ML suspension  Commonly known as:  MILK OF MAGNESIA  Take 30 mLs by mouth daily as needed for mild constipation (constipation).     mirtazapine 15 MG tablet  Commonly known as:  REMERON  Take 15 mg by mouth at bedtime.     omeprazole 20 MG capsule  Commonly known as:  PRILOSEC  Take 20 mg by mouth daily.     PRESERVISION AREDS PO  Take 1 tablet by mouth daily.     sertraline 50 MG tablet  Commonly known as:  ZOLOFT  Take 50 mg by mouth at bedtime.     simvastatin 40 MG tablet  Commonly known as:  ZOCOR  Take 1 tablet (40 mg total) by mouth daily.     spironolactone 25 MG tablet  Commonly known as:  ALDACTONE  Take 0.5 tablets (12.5 mg total) by mouth daily.        Filed Vitals:   04/17/16 0625 04/17/16 0926   BP: 131/68 114/56  Pulse: 74 74  Temp: 98.7 F (37.1 C) 97.9 F (36.6 C)  Resp: 16 18    Skin clean, dry and intact without evidence of skin break down, no evidence of skin tears noted. IV catheter discontinued intact. Site without signs and symptoms of complications. Dressing and pressure applied. Pt denies pain at this time. No complaints noted.  An After Visit Summary was printed and given to the patient. Patient escorted via stretcher, and D/C home via ambulance.  [name] BSN, RN

## 2016-04-17 NOTE — Consult Note (Signed)
   [facility] CM [facility] Consult   04/17/2016  [name] 05-14-1923 [id number]    [facility] services. Chart reviewed. Noted discharge plan is for SNF.  There are no identifiable [facility] needs at this time. If patient's post hospital needs change, please place a [facility] consult. For questions please contact:  Raiford Nobletika Josey Forcier, MSN-Ed, RN,BSN [facility] Care Management Hospital Liaison [phone]

## 2016-04-18 ENCOUNTER — Encounter: Payer: Commercial Managed Care - HMO | Admitting: Cardiovascular Disease

## 2016-04-18 DIAGNOSIS — I480 Paroxysmal atrial fibrillation: Secondary | ICD-10-CM | POA: Diagnosis not present

## 2016-04-18 DIAGNOSIS — I509 Heart failure, unspecified: Secondary | ICD-10-CM | POA: Diagnosis not present

## 2016-04-18 DIAGNOSIS — F322 Major depressive disorder, single episode, severe without psychotic features: Secondary | ICD-10-CM | POA: Diagnosis not present

## 2016-04-18 DIAGNOSIS — E119 Type 2 diabetes mellitus without complications: Secondary | ICD-10-CM | POA: Diagnosis not present

## 2016-04-18 DIAGNOSIS — N12 Tubulo-interstitial nephritis, not specified as acute or chronic: Secondary | ICD-10-CM | POA: Diagnosis not present

## 2016-04-23 ENCOUNTER — Encounter: Payer: Self-pay | Admitting: Cardiovascular Disease

## 2016-05-06 ENCOUNTER — Other Ambulatory Visit: Payer: Self-pay

## 2016-05-06 ENCOUNTER — Other Ambulatory Visit: Payer: Self-pay | Admitting: Family Medicine

## 2016-05-06 ENCOUNTER — Encounter (HOSPITAL_COMMUNITY): Payer: Self-pay | Admitting: *Deleted

## 2016-05-06 ENCOUNTER — Ambulatory Visit
Admission: RE | Admit: 2016-05-06 | Discharge: 2016-05-06 | Disposition: A | Discharge status: Home / Self Care | Payer: Commercial Managed Care - HMO | Source: Ambulatory Visit | Attending: Family Medicine | Admitting: Family Medicine

## 2016-05-06 ENCOUNTER — Emergency Department (HOSPITAL_COMMUNITY)
Admission: EM | Admit: 2016-05-06 | Discharge: 2016-05-06 | Disposition: A | Discharge status: Home / Self Care | Payer: Commercial Managed Care - HMO | Attending: Emergency Medicine | Admitting: Emergency Medicine

## 2016-05-06 ENCOUNTER — Encounter: Payer: Self-pay | Admitting: Cardiovascular Disease

## 2016-05-06 ENCOUNTER — Ambulatory Visit (INDEPENDENT_AMBULATORY_CARE_PROVIDER_SITE_OTHER): Payer: Commercial Managed Care - HMO | Admitting: Cardiovascular Disease

## 2016-05-06 VITALS — BP 90/52 | HR 79 | Ht 70.0 in | Wt 194.1 lb

## 2016-05-06 DIAGNOSIS — Z7722 Contact with and (suspected) exposure to environmental tobacco smoke (acute) (chronic): Secondary | ICD-10-CM | POA: Insufficient documentation

## 2016-05-06 DIAGNOSIS — E119 Type 2 diabetes mellitus without complications: Secondary | ICD-10-CM | POA: Diagnosis not present

## 2016-05-06 DIAGNOSIS — R0602 Shortness of breath: Secondary | ICD-10-CM | POA: Diagnosis not present

## 2016-05-06 DIAGNOSIS — Z7982 Long term (current) use of aspirin: Secondary | ICD-10-CM | POA: Insufficient documentation

## 2016-05-06 DIAGNOSIS — Z96642 Presence of left artificial hip joint: Secondary | ICD-10-CM | POA: Insufficient documentation

## 2016-05-06 DIAGNOSIS — N183 Chronic kidney disease, stage 3 (moderate): Secondary | ICD-10-CM | POA: Diagnosis not present

## 2016-05-06 DIAGNOSIS — I5033 Acute on chronic diastolic (congestive) heart failure: Secondary | ICD-10-CM | POA: Diagnosis not present

## 2016-05-06 DIAGNOSIS — I447 Left bundle-branch block, unspecified: Secondary | ICD-10-CM | POA: Diagnosis not present

## 2016-05-06 DIAGNOSIS — I480 Paroxysmal atrial fibrillation: Secondary | ICD-10-CM | POA: Diagnosis not present

## 2016-05-06 DIAGNOSIS — F418 Other specified anxiety disorders: Secondary | ICD-10-CM | POA: Insufficient documentation

## 2016-05-06 DIAGNOSIS — I10 Essential (primary) hypertension: Secondary | ICD-10-CM | POA: Diagnosis not present

## 2016-05-06 DIAGNOSIS — D72829 Elevated white blood cell count, unspecified: Secondary | ICD-10-CM | POA: Diagnosis not present

## 2016-05-06 DIAGNOSIS — E785 Hyperlipidemia, unspecified: Secondary | ICD-10-CM | POA: Insufficient documentation

## 2016-05-06 DIAGNOSIS — Z79899 Other long term (current) drug therapy: Secondary | ICD-10-CM | POA: Diagnosis not present

## 2016-05-06 DIAGNOSIS — R531 Weakness: Secondary | ICD-10-CM | POA: Diagnosis not present

## 2016-05-06 DIAGNOSIS — N10 Acute tubulo-interstitial nephritis: Secondary | ICD-10-CM | POA: Diagnosis not present

## 2016-05-06 DIAGNOSIS — E1122 Type 2 diabetes mellitus with diabetic chronic kidney disease: Secondary | ICD-10-CM | POA: Diagnosis not present

## 2016-05-06 DIAGNOSIS — F4323 Adjustment disorder with mixed anxiety and depressed mood: Secondary | ICD-10-CM

## 2016-05-06 DIAGNOSIS — R05 Cough: Secondary | ICD-10-CM | POA: Diagnosis not present

## 2016-05-06 DIAGNOSIS — R5383 Other fatigue: Secondary | ICD-10-CM | POA: Diagnosis not present

## 2016-05-06 LAB — CBC
HCT: 39.4 % (ref 36.0–46.0)
Hemoglobin: 13.1 g/dL (ref 12.0–15.0)
MCH: 29.9 pg (ref 26.0–34.0)
MCHC: 33.2 g/dL (ref 30.0–36.0)
MCV: 90 fL (ref 78.0–100.0)
PLATELETS: 212 10*3/uL (ref 150–400)
RBC: 4.38 MIL/uL (ref 3.87–5.11)
RDW: 13.7 % (ref 11.5–15.5)
WBC: 9.5 10*3/uL (ref 4.0–10.5)

## 2016-05-06 LAB — BASIC METABOLIC PANEL
Anion gap: 10 (ref 5–15)
BUN: 20 mg/dL (ref 6–20)
CALCIUM: 9.8 mg/dL (ref 8.9–10.3)
CHLORIDE: 105 mmol/L (ref 101–111)
CO2: 25 mmol/L (ref 22–32)
CREATININE: 1.5 mg/dL — AB (ref 0.44–1.00)
GFR, EST AFRICAN AMERICAN: 34 mL/min — AB (ref >60)
GFR, EST NON AFRICAN AMERICAN: 29 mL/min — AB (ref >60)
Glucose, Bld: 146 mg/dL — ABNORMAL HIGH (ref 65–99)
Potassium: 4.1 mmol/L (ref 3.5–5.1)
SODIUM: 140 mmol/L (ref 135–145)

## 2016-05-06 LAB — CBG MONITORING, ED: GLUCOSE-CAPILLARY: 152 mg/dL — AB (ref 65–99)

## 2016-05-06 MED ORDER — ALBUTEROL SULFATE (2.5 MG/3ML) 0.083% IN NEBU
INHALATION_SOLUTION | RESPIRATORY_TRACT | Status: AC
  Filled 2016-05-06: qty 6

## 2016-05-06 MED ORDER — ALBUTEROL SULFATE (2.5 MG/3ML) 0.083% IN NEBU
5.0000 mg | INHALATION_SOLUTION | Freq: Once | RESPIRATORY_TRACT | Status: AC
  Administered 2016-05-06: 5 mg via RESPIRATORY_TRACT

## 2016-05-06 NOTE — ED Notes (Signed)
Pt went to her PCP today and was instructed to come to ED d/t vital signs BP 90/52. Pt takes losartan and was instructed today to stop taking it. Pt has been experiencing shortness of breath intermittently x6 months. Pt is short of breath in triage, unable to complete sentences. 100% on RA.

## 2016-05-06 NOTE — ED Provider Notes (Signed)
CSN: [id number]     Arrival date & time 05/06/16  1525 History   First MD Initiated Contact with Patient 05/06/16 1603     Chief Complaint  Patient presents with  . Shortness of Breath  . Weakness     (Consider location/radiation/quality/duration/timing/severity/associated sxs/prior Treatment) HPI   [name] is a 82 y.o. female who presents for shortness of breath, ongoing. She saw her PCP today, and subsequently saw her cardiologist. Her PCP ordered a chest x-ray which was done as an outpatient and is normal. The patient was told by her PCP that urinalysis was normal. She also was told to hold her losartan because she had low blood pressure in the office today. The patient states that her cardiologist suggested that she might need to go into hospice care and recommended a six-month follow-up. She states she is frequently short of breath, even though her oxygen levels at home are "in the 90s". She is upset regarding her insurance company which has been giving her less support that she believes she is due. She is currently living independently, getting assistance with some of her ADLs. She was recently hospitalized with UTI, and subsequently went to a rehabilitation, where she was discharged from 3 days ago. She denies fever, chills, dysuria, cough, chest pain or isolated weakness. There are no other known modifying factors.   Past Medical History  Diagnosis Date  . Cholelithiasis   . Other postprocedural status(V45.89)   . Atrial fibrillation (HCC)   . Hepatic cyst   . GERD (gastroesophageal reflux disease)   . LBBB (left bundle branch block)   . Diabetes mellitus   . Hyperlipidemia   . Hypertension   . Ischemic cardiomyopathy   . Dizziness     with some gait disability  . Peripheral edema   . Constipation   . Macular degeneration    Past Surgical History  Procedure Laterality Date  . Wrist surgery Bilateral     wrist fractures  . Dilation and curettage    . Hip  arthroplasty  05/05/2012    Procedure: ARTHROPLASTY BIPOLAR HIP;  Surgeon: Shelda PalMatthew D Olin, MD;  Location: [facility];  Service: Orthopedics;  Laterality: Left;  . Cholecystectomy    . Ankle surgery Bilateral     fractures  . Tonsillectomy     Family History  Problem Relation Age of Onset  . Heart failure Brother     died age 80  . Leukemia Mother   . Arthritis Father   . Arthritis Brother    Social History  Substance Use Topics  . Smoking status: Passive Smoke Exposure - Never Smoker  . 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 Alcohol Use: No   OB History    No data available     Review of Systems  All other systems reviewed and are negative.     Allergies  Fosamax; Penicillins; Sulfonamide derivatives; and Tetanus toxoid  Home Medications   Prior to Admission medications   Medication Sig Start Date End Date Taking? Authorizing Provider  acetaminophen (TYLENOL) 500 MG tablet Take 500 mg by mouth every 6 (six) hours as needed for pain.    Historical Provider, MD  aspirin 81 MG chewable tablet Chew 81 mg by mouth every evening.     Historical Provider, MD  carvedilol (COREG) 25 MG tablet Take 12.5 mg by mouth 2 (two) times daily with a meal.    Historical Provider, MD  diazepam (VALIUM) 5 MG tablet Take 5-10 mg by mouth every 6 (six) hours as needed for anxiety or sedation.    Historical Provider, MD  furosemide (LASIX) 40 MG tablet Take 1 tablet (40 mg total) by mouth daily. 10/04/15   Wendall Stade, MD  HYDROcodone-acetaminophen (NORCO/VICODIN) 5-325 MG tablet Take 1 tablet by mouth 3 (three) times daily as needed. For pain 03/14/16   Historical Provider, MD  losartan (COZAAR) 50 MG tablet Take 50 mg by mouth 2 (two) times daily. 03/29/16   Historical Provider, MD  magnesium hydroxide (MILK OF MAGNESIA) 400 MG/5ML suspension Take 30 mLs by mouth daily as needed for mild constipation (constipation).    Historical Provider, MD   mirtazapine (REMERON) 15 MG tablet Take 15 mg by mouth at bedtime.     Historical Provider, MD  Multiple Vitamins-Minerals (PRESERVISION AREDS PO) Take 1 tablet by mouth daily.     Historical Provider, MD  omeprazole (PRILOSEC) 20 MG capsule Take 20 mg by mouth daily.    Historical Provider, MD  sertraline (ZOLOFT) 50 MG tablet Take 50 mg by mouth at bedtime.  03/18/16   Historical Provider, MD  sertraline (ZOLOFT) 50 MG tablet Take 50 mg by mouth daily.    Historical Provider, MD  simvastatin (ZOCOR) 40 MG tablet Take 1 tablet (40 mg total) by mouth daily. 10/04/15   Wendall Stade, MD  spironolactone (ALDACTONE) 25 MG tablet Take 0.5 tablets (12.5 mg total) by mouth daily. 03/27/16   Wendall Stade, MD   BP 131/81 mmHg  Pulse 86  Temp(Src) 98 F (36.7 C) (Oral)  Resp 20  SpO2 99% Physical Exam  Constitutional: She is oriented to person, place, and time. She appears well-developed. She appears distressed (Tearful during examination).  Elderly, frail  HENT:  Head: Normocephalic and atraumatic.  Right Ear: External ear normal.  Left Ear: External ear normal.  Eyes: Conjunctivae and EOM are normal. Pupils are equal, round, and reactive to light.  Neck: Normal range of motion and phonation normal. Neck supple.  Cardiovascular: Normal rate, regular rhythm and normal heart sounds.   Pulmonary/Chest: Effort normal and breath sounds normal. No respiratory distress. She has no wheezes. She exhibits no tenderness and no bony tenderness.  Abdominal: Soft. There is no tenderness.  Musculoskeletal: Normal range of motion.  Neurological: She is alert and oriented to person, place, and time. No cranial nerve deficit or sensory deficit. She exhibits normal muscle tone. Coordination normal.  Skin: Skin is warm, dry and intact.  Psychiatric: Her behavior is normal. Judgment and thought content normal.  Depressed  Nursing note and vitals reviewed.   ED Course  Procedures (including critical care  time)  Medications  albuterol (PROVENTIL) (2.5 MG/3ML) 0.083% nebulizer solution (  Not Given 05/06/16 1545)  albuterol (PROVENTIL) (2.5 MG/3ML) 0.083% nebulizer solution 5 mg (5 mg Nebulization Given 05/06/16 1534)    Patient Vitals for the past 24 hrs:  BP Temp Temp src Pulse Resp SpO2  05/06/16 1643 131/81 mmHg - - 86 20 99 %  05/06/16 1553 134/59 mmHg - - 66 20 99 %  05/06/16 1532 (!) 83/61 mmHg 98 F (36.7 C) Oral 71 20 100 %    5:07 PM Reevaluation with update and discussion. After initial assessment and treatment, an updated evaluation reveals No change in clinical status. Findings discussed with patient, all questions were answered. Caran Storck L   15:25- patient's niece who helps her manage her affairs, arrived and I discussed the findings  with her. She understands the treatment recommendations, and plan. All questions were answered.  Labs Review Labs Reviewed  BASIC METABOLIC PANEL - Abnormal; Notable for the following:    Glucose, Bld 146 (*)    Creatinine, Ser 1.50 (*)    GFR calc non Af Amer 29 (*)    GFR calc Af Amer 34 (*)    All other components within normal limits  CBG MONITORING, ED - Abnormal; Notable for the following:    Glucose-Capillary 152 (*)    All other components within normal limits  CBC  URINALYSIS, ROUTINE W REFLEX MICROSCOPIC (NOT AT [facility])    Imaging Review Dg Chest 2 View  05/06/2016  CLINICAL DATA:  Shortness of breath with coughing congestion. EXAM: CHEST  2 VIEW COMPARISON:  04/12/2016 FINDINGS: Hyperexpansion is consistent with emphysema. The lungs are clear wiithout focal pneumonia, edema, pneumothorax or pleural effusion. The cardiopericardial silhouette is within normal limits for size. The visualized bony structures of the thorax are intact. IMPRESSION: No active cardiopulmonary disease. Electronically Signed   By: Kennith Center M.D.   On: 05/06/2016 12:25   I have personally reviewed and evaluated these images and lab results as part of  my medical decision-making.   EKG Interpretation   Date/Time:  Monday May 06 2016 15:33:56 EDT Ventricular Rate:  72 PR Interval:  248 QRS Duration: 160 QT Interval:  458 QTC Calculation: 501 R Axis:   -35 Text Interpretation:  Sinus rhythm with 1st degree A-V block Left axis  deviation Non-specific intra-ventricular conduction block Possible  Inferior infarct , age undetermined Abnormal ECG since last tracing no  significant change Confirmed by Effie Shy  MD, Jerol Rufener [phone]) on 05/06/2016  4:13:43 PM      MDM   Final diagnoses:  Situational mixed anxiety and depressive disorder    Nonspecific ongoing dyspnea. Hypotension on arrival, spontaneously improved. Doubt ACS, acute congestive heart failure, metabolic instability or impending vascular collapse. Labs today indicate mild elevation of creatinine, possibly consistent with overdiuresis, versus effect of losartan. Patient is stable for discharge.  Nursing Notes Reviewed/ Care Coordinated Applicable Imaging Reviewed Interpretation of Laboratory Data incorporated into ED treatment  The patient appears reasonably screened and/or stabilized for discharge and I doubt any other medical condition or other [facility] requiring further screening, evaluation, or treatment in the ED at this time prior to discharge.  Plan: Home Medications- hold spironolactone for 2 days. Do not take losartan until seeing PCP; Home Treatments- rest; return here if the recommended treatment, does not improve the symptoms; Recommended follow up- PCP follow-up in 3 days.  Mancel Bale, MD 05/06/16 [phone]

## 2016-05-06 NOTE — Patient Instructions (Addendum)
Medication Instructions:  Your physician recommends that you continue on your current medications as directed. Please refer to the Current Medication list given to you today.   Labwork: None Ordered   Testing/Procedures: None Ordered   Follow-Up: Dr. Eden EmmsNishan recommends you go directly to the ED at [facility]  Your physician wants you to follow-up in: 3 months with Dr. Eden EmmsNishan. You will receive a reminder letter in the mail two months in advance. If you don't receive a letter, please call our office to schedule the follow-up appointment.   If you need a refill on your cardiac medications before your next appointment, please call your pharmacy.   Thank you for choosing [facility]! Eligha BridegroomMichelle Swinyer, RN [phone]

## 2016-05-06 NOTE — ED Notes (Signed)
Pt is in stable condition upon d/c and is escorted from ED via wheelchair. 

## 2016-05-06 NOTE — Progress Notes (Signed)
Patient ID: [name], female   DOB: [birth year], 82 y.o.   MRN: [id number]   [name] is seen today for F/U of PAF, LBBB, DCM with EF 20% no CAD by cath in 2009 S/P right hip surgery from fall 2011 She has had some continued right hip pain and is taking hydrocodone for it.  She has vertiginous symptoms with change in position She also has stable mild LE edema.   Hospitalized in December with Flu and sepsis Acute renal azotemia and PAF. Lasix and ARB held [facility] to SNF for  A week post d/c   Hospitalized again 5/27 with UTI and pyelonephritis with Klebsiella Notes about CHF but updated echo reviewed and showed EF 60-65% Seen by Dr Clelia Croft today with increasing dyspnea, dizziness , visual changes. Lab work done not back Yet CXR reviewed and no CHF  Very emotional about insurance issues with long term care at [facility] Has another review of benefits in December  ROS: Denies fever, malais, weight loss, blurry vision, decreased visual acuity, cough, sputum, SOB, hemoptysis, pleuritic pain, palpitaitons, heartburn, abdominal pain, melena, lower extremity edema, claudication, or rash.  All other systems reviewed and negative  General: Depressed/Anxoius  Ill elderly female HEENT: normal Neck supple with no adenopathy JVP normal no bruits no thyromegaly Lungs clear with no wheezing and good diaphragmatic motion Heart:  S1/S2 SEM  murmur, no rub, gallop or click PMI normal Abdomen: benighn, BS positve, no tenderness, no AAA no bruit.  No HSM or HJR Distal pulses intact with no bruits Trace edema Neuro non-focal Skin warm and dry Weak LEls with walker    Current Outpatient Prescriptions  Medication Sig Dispense Refill  . acetaminophen (TYLENOL) 500 MG tablet Take 500 mg by mouth every 6 (six) hours as needed for pain.    Marland Kitchen aspirin 81 MG chewable tablet Chew 81 mg by mouth every evening.     . carvedilol (COREG) 25 MG tablet Take 12.5 mg by mouth 2 (two) times daily with a meal.    .  diazepam (VALIUM) 5 MG tablet Take 5-10 mg by mouth every 6 (six) hours as needed for anxiety or sedation.    . furosemide (LASIX) 40 MG tablet Take 1 tablet (40 mg total) by mouth daily. 90 tablet 3  . HYDROcodone-acetaminophen (NORCO/VICODIN) 5-325 MG tablet Take 1 tablet by mouth 3 (three) times daily as needed. For pain  0  . losartan (COZAAR) 50 MG tablet Take 50 mg by mouth 2 (two) times daily.    . magnesium hydroxide (MILK OF MAGNESIA) 400 MG/5ML suspension Take 30 mLs by mouth daily as needed for mild constipation (constipation).    . mirtazapine (REMERON) 15 MG tablet Take 15 mg by mouth at bedtime.     . Multiple Vitamins-Minerals (PRESERVISION AREDS PO) Take 1 tablet by mouth daily.     Marland Kitchen omeprazole (PRILOSEC) 20 MG capsule Take 20 mg by mouth daily.    . sertraline (ZOLOFT) 50 MG tablet Take 50 mg by mouth at bedtime.     . sertraline (ZOLOFT) 50 MG tablet Take 50 mg by mouth daily.    . simvastatin (ZOCOR) 40 MG tablet Take 1 tablet (40 mg total) by mouth daily. 90 tablet 3  . spironolactone (ALDACTONE) 25 MG tablet Take 0.5 tablets (12.5 mg total) by mouth daily. 45 tablet 3   No current facility-administered medications for this visit.   Facility-Administered Medications Ordered in Other Visits  Medication Dose Route Frequency Provider Last Rate Last Dose  .  albuterol (PROVENTIL) (2.5 MG/3ML) 0.083% nebulizer solution             Allergies  Fosamax; Penicillins; Sulfonamide derivatives; and Tetanus toxoid  Electrocardiogram:  06/22/14  SR LBBB PR 236   Assessment and Plan DCM:  EF normal by echo 04/14/26 Not clear what dyspnea is from but she is actually dehydrated on exam. CXR with NAD Concern for acidosis as cause of tachypnea.  Will hold losartan  And lasix. [name] drew labs. She is a fall risk currently with orthostasis, and low BP.  Discussed need to go to ER to be evaluated   PAF:  maint NSR  No pal;pitations no anticoagulation due to falls LBBB:  Stable chronic no  high grade AV block UTI  RX with ceftriaxone through 5/30 repeat UA done at [name]'s office concern for sepsis and dehydration to ER  Long discussion with her about DNR status and hospicie care She is becoming increasingly unable to care for self and distressed about insurance issues and medical bills. Has had valium in past F/U with [name]  F/U with me in 6 months

## 2016-05-06 NOTE — Discharge Instructions (Signed)
There are no complications associated with your heart or lungs at this time.  You are somewhat dehydrated. This has likely contributed to your low blood pressure, and mildly worse renal function.  It is appropriate to not take your losartan, and tell you see your Dr. for a checkup in 3 or 4 days  Also, do not take the spironolactone, until Wednesday.  Talk to the social worker at [facility], regarding your personal day to day living needs.  Also ask your PCP for recommendation about entering into assisted living status at your facility.

## 2016-05-20 DIAGNOSIS — I480 Paroxysmal atrial fibrillation: Secondary | ICD-10-CM | POA: Diagnosis not present

## 2016-05-20 DIAGNOSIS — F322 Major depressive disorder, single episode, severe without psychotic features: Secondary | ICD-10-CM | POA: Diagnosis not present

## 2016-05-20 DIAGNOSIS — E1142 Type 2 diabetes mellitus with diabetic polyneuropathy: Secondary | ICD-10-CM | POA: Diagnosis not present

## 2016-05-20 DIAGNOSIS — I130 Hypertensive heart and chronic kidney disease with heart failure and stage 1 through stage 4 chronic kidney disease, or unspecified chronic kidney disease: Secondary | ICD-10-CM | POA: Diagnosis not present

## 2016-05-20 DIAGNOSIS — I5022 Chronic systolic (congestive) heart failure: Secondary | ICD-10-CM | POA: Diagnosis not present

## 2016-05-20 DIAGNOSIS — M1990 Unspecified osteoarthritis, unspecified site: Secondary | ICD-10-CM | POA: Diagnosis not present

## 2016-05-20 DIAGNOSIS — N183 Chronic kidney disease, stage 3 (moderate): Secondary | ICD-10-CM | POA: Diagnosis not present

## 2016-06-20 DIAGNOSIS — H353232 Exudative age-related macular degeneration, bilateral, with inactive choroidal neovascularization: Secondary | ICD-10-CM | POA: Diagnosis not present

## 2016-06-20 DIAGNOSIS — E119 Type 2 diabetes mellitus without complications: Secondary | ICD-10-CM | POA: Diagnosis not present

## 2016-06-20 DIAGNOSIS — H353133 Nonexudative age-related macular degeneration, bilateral, advanced atrophic without subfoveal involvement: Secondary | ICD-10-CM | POA: Diagnosis not present

## 2016-06-20 DIAGNOSIS — L03019 Cellulitis of unspecified finger: Secondary | ICD-10-CM | POA: Diagnosis not present

## 2016-07-04 ENCOUNTER — Encounter: Payer: Self-pay | Admitting: Cardiovascular Disease

## 2016-07-30 ENCOUNTER — Ambulatory Visit: Payer: Commercial Managed Care - HMO | Admitting: Cardiovascular Disease

## 2016-08-19 DIAGNOSIS — F322 Major depressive disorder, single episode, severe without psychotic features: Secondary | ICD-10-CM | POA: Diagnosis not present

## 2016-08-19 DIAGNOSIS — F411 Generalized anxiety disorder: Secondary | ICD-10-CM | POA: Diagnosis not present

## 2016-08-19 DIAGNOSIS — E1122 Type 2 diabetes mellitus with diabetic chronic kidney disease: Secondary | ICD-10-CM | POA: Diagnosis not present

## 2016-08-19 DIAGNOSIS — E782 Mixed hyperlipidemia: Secondary | ICD-10-CM | POA: Diagnosis not present

## 2016-08-19 DIAGNOSIS — L821 Other seborrheic keratosis: Secondary | ICD-10-CM | POA: Diagnosis not present

## 2016-08-19 DIAGNOSIS — I5022 Chronic systolic (congestive) heart failure: Secondary | ICD-10-CM | POA: Diagnosis not present

## 2016-08-19 DIAGNOSIS — I130 Hypertensive heart and chronic kidney disease with heart failure and stage 1 through stage 4 chronic kidney disease, or unspecified chronic kidney disease: Secondary | ICD-10-CM | POA: Diagnosis not present

## 2016-08-19 DIAGNOSIS — Z23 Encounter for immunization: Secondary | ICD-10-CM | POA: Diagnosis not present

## 2016-08-19 DIAGNOSIS — E1142 Type 2 diabetes mellitus with diabetic polyneuropathy: Secondary | ICD-10-CM | POA: Diagnosis not present

## 2016-08-19 DIAGNOSIS — N183 Chronic kidney disease, stage 3 (moderate): Secondary | ICD-10-CM | POA: Diagnosis not present

## 2016-10-08 ENCOUNTER — Other Ambulatory Visit: Payer: Self-pay | Admitting: Cardiovascular Disease

## 2016-10-16 NOTE — Progress Notes (Signed)
Patient ID: [name], female   DOB: 1922/12/25, 82 y.o.   MRN: [id number]   [name] is seen today for F/U of PAF, LBBB, DCM with EF 20% no CAD by cath in 2009 S/P right hip surgery from fall 2011 She has had some continued right hip pain and is taking hydrocodone for it.  She has vertiginous symptoms with change in position She also has stable mild LE edema.   Hospitalized again 5/27 with UTI and pyelonephritis with Klebsiella Notes about CHF but updated echo reviewed and showed EF 60-65%   Very emotional about insurance issues with long term care at [place] Has another review of benefits in December  ROS: Denies fever, malais, weight loss, blurry vision, decreased visual acuity, cough, sputum, SOB, hemoptysis, pleuritic pain, palpitaitons, heartburn, abdominal pain, melena, lower extremity edema, claudication, or rash.  All other systems reviewed and negative  General: Depressed/Anxoius  Ill elderly female HEENT: normal Neck supple with no adenopathy JVP normal no bruits no thyromegaly Lungs clear with no wheezing and good diaphragmatic motion Heart:  S1/S2 SEM  murmur, no rub, gallop or click PMI normal Abdomen: benighn, BS positve, no tenderness, no AAA no bruit.  No HSM or HJR Distal pulses intact with no bruits Trace edema Neuro non-focal Skin warm and dry Weak LEls with walker    Current Outpatient Prescriptions  Medication Sig Dispense Refill  . acetaminophen (TYLENOL) 500 MG tablet Take 500 mg by mouth every 6 (six) hours as needed for pain.    Marland Kitchen. aspirin 81 MG chewable tablet Chew 81 mg by mouth every evening.     . carvedilol (COREG) 25 MG tablet Take 12.5 mg by mouth 2 (two) times daily with a meal.    . diazepam (VALIUM) 5 MG tablet Take 5-10 mg by mouth every 6 (six) hours as needed for anxiety or sedation.    . furosemide (LASIX) 40 MG tablet TAKE 1 TABLET EVERY DAY 90 tablet 2  . HYDROcodone-acetaminophen (NORCO/VICODIN) 5-325 MG tablet Take 1 tablet by  mouth 3 (three) times daily as needed. For pain  0  . magnesium hydroxide (MILK OF MAGNESIA) 400 MG/5ML suspension Take 30 mLs by mouth daily as needed for mild constipation (constipation).    . mirtazapine (REMERON) 15 MG tablet Take 15 mg by mouth at bedtime.     . Multiple Vitamins-Minerals (PRESERVISION AREDS PO) Take 1 tablet by mouth daily.     Marland Kitchen. omeprazole (PRILOSEC) 20 MG capsule Take 20 mg by mouth daily.    . sertraline (ZOLOFT) 100 MG tablet Take 100 mg by mouth daily.    . simvastatin (ZOCOR) 40 MG tablet TAKE 1 TABLET EVERY DAY 90 tablet 2  . spironolactone (ALDACTONE) 25 MG tablet Take 12.5 mg by mouth daily.     No current facility-administered medications for this visit.     Allergies  Fosamax [alendronate sodium]; Penicillins; Sulfonamide derivatives; and Tetanus toxoid  Electrocardiogram:  06/22/14  SR LBBB PR 236   Assessment and Plan DCM:  EF normal by echo 04/14/26   PAF:  maint NSR  No pal;pitations no anticoagulation due to falls LBBB:  Stable chronic no high grade AV block  Long discussion with her about DNR status and hospicie care She is becoming increasingly unable to care for self and distressed about insurance issues and medical bills. Has had valium in past F/U with [name]  F/U with me in 6 months

## 2016-10-17 ENCOUNTER — Encounter: Payer: Self-pay | Admitting: Cardiovascular Disease

## 2016-10-17 ENCOUNTER — Ambulatory Visit (INDEPENDENT_AMBULATORY_CARE_PROVIDER_SITE_OTHER): Payer: Commercial Managed Care - HMO | Admitting: Cardiovascular Disease

## 2016-10-17 VITALS — BP 130/84 | HR 69 | Ht 70.0 in | Wt 198.8 lb

## 2016-10-17 DIAGNOSIS — I420 Dilated cardiomyopathy: Secondary | ICD-10-CM

## 2016-10-17 DIAGNOSIS — I447 Left bundle-branch block, unspecified: Secondary | ICD-10-CM

## 2016-10-17 NOTE — Patient Instructions (Signed)

## 2017-02-21 ENCOUNTER — Other Ambulatory Visit: Payer: Self-pay | Admitting: Cardiovascular Disease

## 2017-02-21 ENCOUNTER — Other Ambulatory Visit: Payer: Self-pay | Admitting: *Deleted

## 2017-02-24 ENCOUNTER — Emergency Department (HOSPITAL_COMMUNITY)
Admission: EM | Admit: 2017-02-24 | Discharge: 2017-02-24 | Disposition: A | Discharge status: Home / Self Care | Payer: Commercial Managed Care - HMO | Attending: Emergency Medicine | Admitting: Emergency Medicine

## 2017-02-24 ENCOUNTER — Other Ambulatory Visit: Payer: Self-pay

## 2017-02-24 ENCOUNTER — Emergency Department (HOSPITAL_COMMUNITY): Payer: Commercial Managed Care - HMO

## 2017-02-24 DIAGNOSIS — I130 Hypertensive heart and chronic kidney disease with heart failure and stage 1 through stage 4 chronic kidney disease, or unspecified chronic kidney disease: Secondary | ICD-10-CM | POA: Diagnosis not present

## 2017-02-24 DIAGNOSIS — Z0000 Encounter for general adult medical examination without abnormal findings: Secondary | ICD-10-CM | POA: Diagnosis not present

## 2017-02-24 DIAGNOSIS — S4992XA Unspecified injury of left shoulder and upper arm, initial encounter: Secondary | ICD-10-CM | POA: Diagnosis not present

## 2017-02-24 DIAGNOSIS — Y999 Unspecified external cause status: Secondary | ICD-10-CM | POA: Diagnosis not present

## 2017-02-24 DIAGNOSIS — S42402A Unspecified fracture of lower end of left humerus, initial encounter for closed fracture: Secondary | ICD-10-CM | POA: Insufficient documentation

## 2017-02-24 DIAGNOSIS — Y92481 Parking lot as the place of occurrence of the external cause: Secondary | ICD-10-CM | POA: Insufficient documentation

## 2017-02-24 DIAGNOSIS — M25512 Pain in left shoulder: Secondary | ICD-10-CM | POA: Diagnosis not present

## 2017-02-24 DIAGNOSIS — M810 Age-related osteoporosis without current pathological fracture: Secondary | ICD-10-CM | POA: Diagnosis not present

## 2017-02-24 DIAGNOSIS — Z7982 Long term (current) use of aspirin: Secondary | ICD-10-CM | POA: Insufficient documentation

## 2017-02-24 DIAGNOSIS — R42 Dizziness and giddiness: Secondary | ICD-10-CM | POA: Diagnosis not present

## 2017-02-24 DIAGNOSIS — N183 Chronic kidney disease, stage 3 (moderate): Secondary | ICD-10-CM | POA: Diagnosis not present

## 2017-02-24 DIAGNOSIS — W1830XA Fall on same level, unspecified, initial encounter: Secondary | ICD-10-CM | POA: Diagnosis not present

## 2017-02-24 DIAGNOSIS — I480 Paroxysmal atrial fibrillation: Secondary | ICD-10-CM | POA: Diagnosis not present

## 2017-02-24 DIAGNOSIS — I429 Cardiomyopathy, unspecified: Secondary | ICD-10-CM | POA: Diagnosis not present

## 2017-02-24 DIAGNOSIS — S42492A Other displaced fracture of lower end of left humerus, initial encounter for closed fracture: Secondary | ICD-10-CM | POA: Diagnosis not present

## 2017-02-24 DIAGNOSIS — Y9389 Activity, other specified: Secondary | ICD-10-CM | POA: Insufficient documentation

## 2017-02-24 DIAGNOSIS — Z79899 Other long term (current) drug therapy: Secondary | ICD-10-CM | POA: Diagnosis not present

## 2017-02-24 DIAGNOSIS — I10 Essential (primary) hypertension: Secondary | ICD-10-CM | POA: Diagnosis not present

## 2017-02-24 DIAGNOSIS — I5022 Chronic systolic (congestive) heart failure: Secondary | ICD-10-CM | POA: Diagnosis not present

## 2017-02-24 DIAGNOSIS — Z7722 Contact with and (suspected) exposure to environmental tobacco smoke (acute) (chronic): Secondary | ICD-10-CM | POA: Insufficient documentation

## 2017-02-24 DIAGNOSIS — E119 Type 2 diabetes mellitus without complications: Secondary | ICD-10-CM | POA: Insufficient documentation

## 2017-02-24 DIAGNOSIS — F322 Major depressive disorder, single episode, severe without psychotic features: Secondary | ICD-10-CM | POA: Diagnosis not present

## 2017-02-24 DIAGNOSIS — S52501A Unspecified fracture of the lower end of right radius, initial encounter for closed fracture: Secondary | ICD-10-CM | POA: Diagnosis not present

## 2017-02-24 DIAGNOSIS — E1142 Type 2 diabetes mellitus with diabetic polyneuropathy: Secondary | ICD-10-CM | POA: Diagnosis not present

## 2017-02-24 DIAGNOSIS — S59902A Unspecified injury of left elbow, initial encounter: Secondary | ICD-10-CM | POA: Diagnosis not present

## 2017-02-24 DIAGNOSIS — T148XXA Other injury of unspecified body region, initial encounter: Secondary | ICD-10-CM | POA: Diagnosis not present

## 2017-02-24 MED ORDER — SODIUM CHLORIDE 0.9 % IV SOLN: INTRAVENOUS | Status: DC

## 2017-02-24 MED ORDER — MORPHINE SULFATE (PF) 4 MG/ML IV SOLN
4.0000 mg | Freq: Once | INTRAVENOUS | Status: AC
  Administered 2017-02-24: 4 mg via INTRAVENOUS
  Filled 2017-02-24: qty 1

## 2017-02-24 MED ORDER — OXYCODONE-ACETAMINOPHEN 5-325 MG PO TABS: 1.0000 | ORAL_TABLET | ORAL | 0 refills | Status: DC | PRN

## 2017-02-24 MED ORDER — LORAZEPAM 2 MG/ML IJ SOLN
1.0000 mg | Freq: Once | INTRAMUSCULAR | Status: AC
  Administered 2017-02-24: 1 mg via INTRAVENOUS
  Filled 2017-02-24: qty 1

## 2017-02-24 NOTE — ED Notes (Signed)
Pt became upset and became very emotional following EDP conversation about discharge. Pt was expecting to be admitted. Pt stated "I want to go home and take every damn one of my pills and just go to bed." pt stated several times that she wished she would have just died after falling. EDP notified.

## 2017-02-24 NOTE — ED Provider Notes (Signed)
[facility]EMER[facility]ENCY DEPT Provider Note   CSN: [id number] Arrival date & time: 02/24/17  1418  By signing my name below, I, Linna Darner, attest that this documentation has been prepared under the direction and in the presence of physician practitioner, Raeford Razor, MD. Electronically Signed: Linna Darner, Scribe. 02/24/2017. 2:55 PM.  History   Chief Complaint Chief Complaint  Patient presents with  . Fall    The history is provided by the patient. No language interpreter was used.     HPI Comments: [name] is a 83 y.o. female with PMHx including DM, A-Fib, CHF, HTN, HLD, and chronic dizziness who presents to the Emergency Department via EMS complaining of constant left elbow pain s/p falling shortly PTA. She reports associated swelling. She states she was exiting her car in a parking lot, became dizzy, and struck her left side on a curb. No head trauma or LOC. She reports a skin tear to her left elbow and notes the wound has been bleeding since it was sustained. Pt endorses significant pain exacerbation with applied pressure to her left elbow. She received fentanyl  from EMS en route to the ED with minimal improvement of her pain. Pt has not ambulated since the fall. She uses a daily baby aspirin but no other blood thinners. Tetanus status unknown. Pt denies left wrist pain, left shoulder pain, headaches, abdominal pain, lower extremity pain, or any other associated symptoms.  Past Medical History:  Diagnosis Date  . Atrial fibrillation (HCC)   . Cholelithiasis   . Constipation   . Diabetes mellitus   . Dizziness    with some gait disability  . GERD (gastroesophageal reflux disease)   . Hepatic cyst   . Hyperlipidemia   . Hypertension   . Ischemic cardiomyopathy   . LBBB (left bundle branch block)   . Macular degeneration   . Other postprocedural status(V45.89)   . Peripheral edema     Patient Active Problem List   Diagnosis Date Noted  . Normocytic anemia  04/13/2016  . Acute on chronic combined systolic and diastolic CHF (congestive heart failure) (HCC) 04/13/2016  . Pyelonephritis 04/12/2016  . Chronic combined systolic and diastolic congestive heart failure (HCC) 11/14/2013  . Constipation 11/14/2013  . Nausea 11/14/2013  . Influenza with respiratory manifestations 11/09/2013  . AKI (acute kidney injury) (HCC) 11/09/2013  . Influenza 11/07/2013  . Acute on chronic combined systolic and diastolic congestive heart failure (HCC) 11/07/2013  . Acute respiratory failure with hypoxia (HCC) 11/07/2013  . Fever 11/07/2013  . Generalized weakness 11/07/2013  . Peripheral neuropathy (HCC) 05/05/2012  . Closed left hip fracture (HCC) 05/04/2012  . Chronic systolic HF (heart failure) (HCC) 05/04/2012  . Arthritis 07/23/2011  . CHF (congestive heart failure) (HCC) 07/23/2011  . DYSPNEA 07/31/2010  . DEPRESSIVE DISORDER NOT ELSEWHERE CLASSIFIED 02/08/2009  . Type 2 diabetes mellitus with diabetic neuropathy, without long-term current use of insulin (HCC) 11/08/2008  . HYPERLIPIDEMIA-MIXED 11/08/2008  . Essential hypertension 11/08/2008  . LBBB (left bundle branch block) 11/08/2008  . Atrial fibrillation (HCC) 11/08/2008  . GERD 11/08/2008  . HEPATIC CYST 11/08/2008  . CHOLELITHIASIS 11/08/2008  . DILATION AND CURETTAGE, HX OF 11/08/2008    Past Surgical History:  Procedure Laterality Date  . ANKLE SURGERY Bilateral    fractures  . CHOLECYSTECTOMY    . Dilation and curettage    . HIP ARTHROPLASTY  05/05/2012   Procedure: ARTHROPLASTY BIPOLAR HIP;  Surgeon: Shelda Pal, MD;  Location: [facility];  Service:  Orthopedics;  Laterality: Left;  . TONSILLECTOMY    . WRIST SURGERY Bilateral    wrist fractures    OB History    No data available       Home Medications    Prior to Admission medications   Medication Sig Start Date End Date Taking? Authorizing Provider  acetaminophen (TYLENOL) 500 MG tablet Take 500 mg by mouth every 6 (six)  hours as needed for pain.    Historical Provider, MD  aspirin 81 MG chewable tablet Chew 81 mg by mouth every evening.     Historical Provider, MD  carvedilol (COREG) 12.5 MG tablet Take 1 tablet (12.5 mg total) by mouth 2 (two) times daily with a meal. 02/21/17   Wendall Stade, MD  diazepam (VALIUM) 5 MG tablet Take 5-10 mg by mouth every 6 (six) hours as needed for anxiety or sedation.    Historical Provider, MD  furosemide (LASIX) 40 MG tablet TAKE 1 TABLET EVERY DAY 10/08/16   Wendall Stade, MD  HYDROcodone-acetaminophen (NORCO/VICODIN) 5-325 MG tablet Take 1 tablet by mouth 3 (three) times daily as needed. For pain 03/14/16   Historical Provider, MD  magnesium hydroxide (MILK OF MAGNESIA) 400 MG/5ML suspension Take 30 mLs by mouth daily as needed for mild constipation (constipation).    Historical Provider, MD  mirtazapine (REMERON) 15 MG tablet Take 15 mg by mouth at bedtime.     Historical Provider, MD  Multiple Vitamins-Minerals (PRESERVISION AREDS PO) Take 1 tablet by mouth daily.     Historical Provider, MD  omeprazole (PRILOSEC) 20 MG capsule Take 20 mg by mouth daily.    Historical Provider, MD  sertraline (ZOLOFT) 100 MG tablet Take 100 mg by mouth daily. 08/25/16   Historical Provider, MD  simvastatin (ZOCOR) 40 MG tablet TAKE 1 TABLET EVERY DAY 10/08/16   Wendall Stade, MD  spironolactone (ALDACTONE) 25 MG tablet Take 0.5 tablets (12.5 mg total) by mouth daily. 02/21/17   Wendall Stade, MD    Family History Family History  Problem Relation Age of Onset  . Leukemia Mother   . Arthritis Father   . Heart failure Brother     died age 57  . Arthritis Brother     Social History Social History  Substance Use Topics  . Smoking status: Passive Smoke Exposure - Never Smoker  . 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 Alcohol use No     Allergies   Fosamax [alendronate sodium]; Penicillins; Sulfonamide derivatives; and  Tetanus toxoid   Review of Systems Review of Systems  Gastrointestinal: Negative for abdominal pain.  Musculoskeletal: Positive for arthralgias and joint swelling. Negative for myalgias.  Skin: Positive for wound.  Neurological: Positive for dizziness. Negative for syncope and headaches.  All other systems reviewed and are negative.  Physical Exam Updated Vital Signs BP (!) 149/63 (BP Location: Right Arm)   Pulse 71   Temp 97.9 F (36.6 C) (Oral)   Resp 16   Ht  (1.778 m)   Wt 195 lb (88.5 kg)   SpO2 100%   BMI 27.98 kg/m   Physical Exam  Constitutional: She appears well-developed and well-nourished.  HENT:  Head: Normocephalic.  Right Ear: External ear normal.  Left Ear: External ear normal.  Nose: Nose normal.  Mouth/Throat: Oropharynx is clear and moist.  Eyes: Conjunctivae are normal. Right eye exhibits no discharge. Left eye exhibits no discharge.  Neck: Normal  range of motion.  Cardiovascular: Normal rate, regular rhythm and normal heart sounds.   No murmur heard. Pulmonary/Chest: Effort normal and breath sounds normal. No respiratory distress. She has no wheezes. She has no rales.  Abdominal: Soft. She exhibits no distension. There is no tenderness. There is no rebound and no guarding.  Musculoskeletal: She exhibits tenderness.  Significant left elbow tenderness and swelling. Cannot range secondary to pain. No midline cervical tenderness. She is complaining of L lateral/proximal thigh pain. Tender over what feels like a small hematoma there. Can actively range her L hip with no apparent discomfort though. Suspect soft tissue injury. Highly doubt fracture.   Neurological: She is alert. No cranial nerve deficit. Coordination normal.  Neurovascularly intact distally.  Skin: Skin is warm and dry. No rash noted. No erythema. No pallor.  Skin tear to left elbow. I do not think this is an open injury.  Psychiatric: She has a normal mood and affect. Her behavior is  normal.  Nursing note and vitals reviewed.  ED Treatments / Results  Labs (all labs ordered are listed, but only abnormal results are displayed) Labs Reviewed - No data to display  EKG  EKG Interpretation None       Radiology Dg Elbow Complete Left  Result Date: 02/24/2017 CLINICAL DATA:  83 year old female status post fall this morning while walking to car landing on left arm. Pain. Initial encounter. EXAM: LEFT ELBOW - COMPLETE 3+ VIEW COMPARISON:  None. FINDINGS: Obliquely oriented, Comminuted and impacted distal left humerus fracture extending from the medial epicondyles through to the lateral metadiaphysis (image 1). Lateral displacement of the lateral epicondyles up to 6 mm. Comminution through the trochlea with involvement of the joint space. Relatively small joint effusion. Posterior soft tissue injury evident at the level of the olecranon. No definite acute fracture of the proximal left radius or ulna. The visible left humerus shaft remains intact. IMPRESSION: 1. Intraarticular, comminuted and impacted fracture of the distal left humerus. This includes comminution of the trochlea. 2. No definite superimposed fracture of the proximal left radius or ulna. Electronically Signed   By: Odessa Fleming M.D.   On: 02/24/2017 16:36   Dg Shoulder Left  Result Date: 02/24/2017 CLINICAL DATA:  83 year old female status post fall this morning while walking to car landing on left arm. Pain. Distal left humerus fracture. Initial encounter. EXAM: LEFT SHOULDER - 2+ VIEW COMPARISON:  Left humerus series today reported separately. FINDINGS: No glenohumeral joint dislocation. Proximal left humerus intact. Left clavicle and scapula appear intact. Negative visible left ribs and lung parenchyma. IMPRESSION: No acute fracture or dislocation identified about the left shoulder. Electronically Signed   By: Odessa Fleming M.D.   On: 02/24/2017 16:39   Dg Humerus Left  Result Date: 02/24/2017 CLINICAL DATA:  83 year old  female status post fall this morning while walking to car landing on left arm. Pain. Distal left humerus fracture. Initial encounter. EXAM: LEFT HUMERUS - 2+ VIEW COMPARISON:  Left elbow series from today reported separately. FINDINGS: Comminuted distal left humerus fracture involving the left elbow joint, see comparison. The left humerus shaft is intact. The proximal left humerus appears intact. Visible left clavicle and scapula as well as visible left ribs appear intact. IMPRESSION: 1. Comminuted, intra-articular distal left humerus fracture. See elbow series today. 2. No other acute fracture or dislocation identified about the left humerus. Electronically Signed   By: Odessa Fleming M.D.   On: 02/24/2017 16:38    Procedures Procedures (including critical care  time)  DIAGNOSTIC STUDIES: Oxygen Saturation is 100% on RA, normal by my interpretation.    COORDINATION OF CARE: 3:05 PM Discussed treatment plan with pt at bedside and pt agreed to plan.  Medications Ordered in ED Medications - No data to display   Initial Impression / Assessment and Plan / ED Course  I have reviewed the triage vital signs and the nursing notes.  Pertinent labs & imaging results that were available during my care of the patient were reviewed by me and considered in my medical decision making (see chart for details).  89yF with L elbow pain after fall. Skin tear to elbow. I do NOT suspect an open fracture. Some mild TTP in L shoulder as well. No midline spinal tenderness. Denies HA. Nonfocal neuro exam.   Discussed with Dr Melvyn Novas, ortho. Splint and FU in office. Pt lives alone in apartment. Family can assist her somewhat. Pt thinks CNAs at her complex are available to help as well. She would like to speak with social work to see if other assistance may be available to her.   Final Clinical Impressions(s) / ED Diagnoses   Final diagnoses:  Elbow fracture, left, closed, initial encounter    New Prescriptions New  Prescriptions   No medications on file   I personally preformed the services scribed in my presence. The recorded information has been reviewed is accurate. Raeford Razor, MD.    Raeford Razor, MD 02/24/17 [phone]

## 2017-02-24 NOTE — Progress Notes (Signed)
Orthopedic Tech Progress Note Patient Details:  [name] Apr 02, 1923 [id number]  Ortho Devices Type of Ortho Device: Ace wrap, Arm sling, Post (long arm) splint Ortho Device/Splint Location: LUE Ortho Device/Splint Interventions: Ordered, Application   Jennye Moccasin 02/24/2017, 5:21 PM

## 2017-02-24 NOTE — ED Triage Notes (Addendum)
Pt arrived via [facility] after falling in parking lot while exiting her car. Pt states she became dizzy and fell, striking her left elbow on the ground. Pt has skin tear to right elbow of which was bandaged and splinted in place by EMS. Pt denies striking head. Pt received  fentanyl and  zofran en route to ED. Pt is stable and alert x4.

## 2017-02-25 DIAGNOSIS — S42492A Other displaced fracture of lower end of left humerus, initial encounter for closed fracture: Secondary | ICD-10-CM | POA: Diagnosis not present

## 2017-03-04 DIAGNOSIS — S42292D Other displaced fracture of upper end of left humerus, subsequent encounter for fracture with routine healing: Secondary | ICD-10-CM | POA: Diagnosis not present

## 2017-03-20 DIAGNOSIS — S42492D Other displaced fracture of lower end of left humerus, subsequent encounter for fracture with routine healing: Secondary | ICD-10-CM | POA: Diagnosis not present

## 2017-03-26 NOTE — Progress Notes (Signed)
Patient ID: [name], female   DOB: 08-May-1923, 83 y.o.   MRN: [id number]   [name] is seen today for F/U of PAF, LBBB, DCM with EF 20% no CAD by cath in 2009 S/P right hip surgery from fall 2011  She has vertiginous symptoms with change in position She also has stable mild LE edema.   Hospitalized  5/27 with UTI and pyelonephritis with Klebsiella Notes about CHF but updated echo reviewed and showed EF 60-65%  May 2017   Very emotional about insurance issues with long term care at [facility] Has another review of benefits in December  Fell an broke elbow has arm in splint now  ROS: Denies fever, malais, weight loss, blurry vision, decreased visual acuity, cough, sputum, SOB, hemoptysis, pleuritic pain, palpitaitons, heartburn, abdominal pain, melena, lower extremity edema, claudication, or rash.  All other systems reviewed and negative  General: Depressed/Anxoius  Ill elderly female HEENT: normal Neck supple with no adenopathy JVP normal no bruits no thyromegaly Lungs clear with no wheezing and good diaphragmatic motion Heart:  S1/S2 SEM  murmur, no rub, gallop or click PMI normal Abdomen: benighn, BS positve, no tenderness, no AAA no bruit.  No HSM or HJR Distal pulses intact with no bruits Trace edema Neuro non-focal Skin warm and dry Weak LEls with walker    Current Outpatient Prescriptions  Medication Sig Dispense Refill  . aspirin 81 MG chewable tablet Chew 81 mg by mouth every evening.     . carvedilol (COREG) 12.5 MG tablet Take 1 tablet (12.5 mg total) by mouth 2 (two) times daily with a meal. 180 tablet 1  . diazepam (VALIUM) 5 MG tablet Take 5-10 mg by mouth every 6 (six) hours as needed for anxiety or sedation.    . furosemide (LASIX) 40 MG tablet Take 40 mg by mouth daily.    Marland Kitchen HYDROcodone-acetaminophen (NORCO/VICODIN) 5-325 MG tablet Take 1 tablet by mouth 3 (three) times daily as needed. For pain  0  . magnesium hydroxide (MILK OF MAGNESIA) 400 MG/5ML  suspension Take 30 mLs by mouth daily as needed for mild constipation (constipation).    . mirtazapine (REMERON) 15 MG tablet Take 15 mg by mouth at bedtime.     . Multiple Vitamins-Minerals (PRESERVISION AREDS PO) Take 1 tablet by mouth daily.     Marland Kitchen omeprazole (PRILOSEC) 20 MG capsule Take 20 mg by mouth daily.    Marland Kitchen oxyCODONE-acetaminophen (PERCOCET/ROXICET) 5-325 MG tablet Take 1-2 tablets by mouth every 4 (four) hours as needed for severe pain. 20 tablet 0  . sertraline (ZOLOFT) 100 MG tablet Take 100 mg by mouth daily.    . simvastatin (ZOCOR) 40 MG tablet Take 40 mg by mouth daily.    Marland Kitchen spironolactone (ALDACTONE) 25 MG tablet Take 0.5 tablets (12.5 mg total) by mouth daily. 45 tablet 1   No current facility-administered medications for this visit.     Allergies  Fosamax [alendronate sodium]; Penicillins; Sulfonamide derivatives; and Tetanus toxoid  Electrocardiogram:  06/22/14  SR LBBB PR 236   Assessment and Plan DCM:  EF normal by echo 04/14/26  Continue beta blocker and diuretics   PAF:  maint NSR  No papitations no anticoagulation due to falls LBBB:  Stable chronic no high grade AV block  Depression: encouraged her to talk to primary about changing zoloft or seeing psychiatrist. She indicates no wish to hurt herself but would not mind passing peacefully in sleep   Ortho:  Fractured olecranon splint f/u PT  Long discussion with her about DNR status and hospicie care She is becoming increasingly unable to care for self and distressed about insurance issues and medical bills. Has had valium in past F/U with [name]  F/U with me in 6 months

## 2017-03-31 ENCOUNTER — Ambulatory Visit (INDEPENDENT_AMBULATORY_CARE_PROVIDER_SITE_OTHER): Payer: Medicare HMO | Admitting: Cardiovascular Disease

## 2017-03-31 ENCOUNTER — Encounter: Payer: Self-pay | Admitting: Cardiovascular Disease

## 2017-03-31 VITALS — BP 100/60 | HR 81 | Ht 70.0 in | Wt 190.6 lb

## 2017-03-31 DIAGNOSIS — I447 Left bundle-branch block, unspecified: Secondary | ICD-10-CM | POA: Diagnosis not present

## 2017-03-31 NOTE — Patient Instructions (Signed)

## 2017-04-08 DIAGNOSIS — F322 Major depressive disorder, single episode, severe without psychotic features: Secondary | ICD-10-CM | POA: Diagnosis not present

## 2017-04-08 DIAGNOSIS — I130 Hypertensive heart and chronic kidney disease with heart failure and stage 1 through stage 4 chronic kidney disease, or unspecified chronic kidney disease: Secondary | ICD-10-CM | POA: Diagnosis not present

## 2017-04-08 DIAGNOSIS — F411 Generalized anxiety disorder: Secondary | ICD-10-CM | POA: Diagnosis not present

## 2017-04-11 DIAGNOSIS — S42492D Other displaced fracture of lower end of left humerus, subsequent encounter for fracture with routine healing: Secondary | ICD-10-CM | POA: Diagnosis not present

## 2017-06-20 DIAGNOSIS — S42492D Other displaced fracture of lower end of left humerus, subsequent encounter for fracture with routine healing: Secondary | ICD-10-CM | POA: Diagnosis not present

## 2017-07-07 DIAGNOSIS — I130 Hypertensive heart and chronic kidney disease with heart failure and stage 1 through stage 4 chronic kidney disease, or unspecified chronic kidney disease: Secondary | ICD-10-CM | POA: Diagnosis not present

## 2017-07-07 DIAGNOSIS — F322 Major depressive disorder, single episode, severe without psychotic features: Secondary | ICD-10-CM | POA: Diagnosis not present

## 2017-07-07 DIAGNOSIS — F411 Generalized anxiety disorder: Secondary | ICD-10-CM | POA: Diagnosis not present

## 2017-07-07 DIAGNOSIS — E782 Mixed hyperlipidemia: Secondary | ICD-10-CM | POA: Diagnosis not present

## 2017-07-07 DIAGNOSIS — I5022 Chronic systolic (congestive) heart failure: Secondary | ICD-10-CM | POA: Diagnosis not present

## 2017-07-07 DIAGNOSIS — E1142 Type 2 diabetes mellitus with diabetic polyneuropathy: Secondary | ICD-10-CM | POA: Diagnosis not present

## 2017-07-07 DIAGNOSIS — N183 Chronic kidney disease, stage 3 (moderate): Secondary | ICD-10-CM | POA: Diagnosis not present

## 2017-07-07 DIAGNOSIS — I480 Paroxysmal atrial fibrillation: Secondary | ICD-10-CM | POA: Diagnosis not present

## 2017-07-07 DIAGNOSIS — H9190 Unspecified hearing loss, unspecified ear: Secondary | ICD-10-CM | POA: Diagnosis not present

## 2017-07-09 ENCOUNTER — Other Ambulatory Visit: Payer: Self-pay | Admitting: Cardiovascular Disease

## 2017-07-10 DIAGNOSIS — H353132 Nonexudative age-related macular degeneration, bilateral, intermediate dry stage: Secondary | ICD-10-CM | POA: Diagnosis not present

## 2017-07-14 DIAGNOSIS — H9012 Conductive hearing loss, unilateral, left ear, with unrestricted hearing on the contralateral side: Secondary | ICD-10-CM | POA: Diagnosis not present

## 2017-07-14 DIAGNOSIS — H6122 Impacted cerumen, left ear: Secondary | ICD-10-CM | POA: Diagnosis not present

## 2017-07-14 DIAGNOSIS — K047 Periapical abscess without sinus: Secondary | ICD-10-CM | POA: Diagnosis not present

## 2017-07-22 ENCOUNTER — Ambulatory Visit (INDEPENDENT_AMBULATORY_CARE_PROVIDER_SITE_OTHER): Payer: Medicare HMO | Admitting: Sports Medicine

## 2017-07-22 ENCOUNTER — Encounter: Payer: Self-pay | Admitting: Sports Medicine

## 2017-07-22 DIAGNOSIS — M79674 Pain in right toe(s): Secondary | ICD-10-CM | POA: Diagnosis not present

## 2017-07-22 DIAGNOSIS — M2041 Other hammer toe(s) (acquired), right foot: Secondary | ICD-10-CM

## 2017-07-22 DIAGNOSIS — M2042 Other hammer toe(s) (acquired), left foot: Secondary | ICD-10-CM

## 2017-07-22 DIAGNOSIS — L84 Corns and callosities: Secondary | ICD-10-CM | POA: Diagnosis not present

## 2017-07-22 DIAGNOSIS — E119 Type 2 diabetes mellitus without complications: Secondary | ICD-10-CM | POA: Diagnosis not present

## 2017-07-22 DIAGNOSIS — M79675 Pain in left toe(s): Secondary | ICD-10-CM | POA: Diagnosis not present

## 2017-07-22 DIAGNOSIS — B351 Tinea unguium: Secondary | ICD-10-CM | POA: Diagnosis not present

## 2017-07-22 NOTE — Progress Notes (Signed)
   Subjective:    Patient ID: [name], female    DOB: [birth year], 83 y.o.   MRN: [id number]  HPI    Review of Systems  Constitutional: Positive for fatigue.  HENT: Positive for sinus pain and sneezing.   Cardiovascular: Positive for palpitations and leg swelling.  Gastrointestinal: Positive for constipation.  Musculoskeletal: Positive for arthralgias.  All other systems reviewed and are negative.      Objective:   Physical Exam        Assessment & Plan:

## 2017-07-22 NOTE — Progress Notes (Signed)
Subjective: [name] is a 83 y.o. female patient with history of diabetes who presents to office today complaining of long, painful nails  while ambulating in shoes; unable to trim. Patient states that the glucose reading this morning was not recorded; not on meds. Patient denies any new changes in medication. States that sometime she gets swelling on left. Admits that she pulled skin off of left 5th toe and made it bleed, No other issues.  Patient is from assisted living [place], [place] at [facility].   Patient Active Problem List   Diagnosis Date Noted  . Normocytic anemia 04/13/2016  . Acute on chronic combined systolic and diastolic CHF (congestive heart failure) (HCC) 04/13/2016  . Pyelonephritis 04/12/2016  . Chronic combined systolic and diastolic congestive heart failure (HCC) 11/14/2013  . Constipation 11/14/2013  . Nausea 11/14/2013  . Influenza with respiratory manifestations 11/09/2013  . AKI (acute kidney injury) (HCC) 11/09/2013  . Influenza 11/07/2013  . Acute on chronic combined systolic and diastolic congestive heart failure (HCC) 11/07/2013  . Acute respiratory failure with hypoxia (HCC) 11/07/2013  . Fever 11/07/2013  . Generalized weakness 11/07/2013  . Peripheral neuropathy 05/05/2012  . Closed left hip fracture (HCC) 05/04/2012  . Chronic systolic HF (heart failure) (HCC) 05/04/2012  . Arthritis 07/23/2011  . CHF (congestive heart failure) (HCC) 07/23/2011  . DYSPNEA 07/31/2010  . DEPRESSIVE DISORDER NOT ELSEWHERE CLASSIFIED 02/08/2009  . Type 2 diabetes mellitus with diabetic neuropathy, without long-term current use of insulin (HCC) 11/08/2008  . HYPERLIPIDEMIA-MIXED 11/08/2008  . Essential hypertension 11/08/2008  . LBBB (left bundle branch block) 11/08/2008  . Atrial fibrillation (HCC) 11/08/2008  . GERD 11/08/2008  . HEPATIC CYST 11/08/2008  . CHOLELITHIASIS 11/08/2008  . DILATION AND CURETTAGE, HX OF 11/08/2008   Current  Outpatient Prescriptions on File Prior to Visit  Medication Sig Dispense Refill  . aspirin 81 MG chewable tablet Chew 81 mg by mouth every evening.     . carvedilol (COREG) 12.5 MG tablet TAKE 1 TABLET TWO TIMES DAILY WITH A MEAL. 180 tablet 2  . diazepam (VALIUM) 5 MG tablet Take 5-10 mg by mouth every 6 (six) hours as needed for anxiety or sedation.    . furosemide (LASIX) 40 MG tablet Take 40 mg by mouth daily.    Marland Kitchen HYDROcodone-acetaminophen (NORCO/VICODIN) 5-325 MG tablet Take 1 tablet by mouth 3 (three) times daily as needed. For pain  0  . magnesium hydroxide (MILK OF MAGNESIA) 400 MG/5ML suspension Take 30 mLs by mouth daily as needed for mild constipation (constipation).    . mirtazapine (REMERON) 15 MG tablet Take 15 mg by mouth at bedtime.     . Multiple Vitamins-Minerals (PRESERVISION AREDS PO) Take 1 tablet by mouth daily.     Marland Kitchen omeprazole (PRILOSEC) 20 MG capsule Take 20 mg by mouth daily.    . sertraline (ZOLOFT) 100 MG tablet Take 100 mg by mouth daily.    . simvastatin (ZOCOR) 40 MG tablet Take 40 mg by mouth daily.    Marland Kitchen spironolactone (ALDACTONE) 25 MG tablet Take 0.5 tablets (12.5 mg total) by mouth daily. 45 tablet 1   No current facility-administered medications on file prior to visit.    Allergies  Allergen Reactions  . Fosamax [Alendronate Sodium] Swelling  . Penicillins Swelling  . Sulfonamide Derivatives Swelling  . Tetanus Toxoid Swelling    No results found for this or any previous visit (from the past 2160 hour(s)).  Objective: General: Patient is awake, alert, and  oriented x 3 and in no acute distress.  Integument: Skin is warm, dry and supple bilateral. Nails are tender, long, thickened and  dystrophic with subungual debris, consistent with onychomycosis, 1-5 bilateral. No signs of infection. No open lesions or preulcerative lesions present bilateral. Minimal corn/callus at left 1st toe and 5th toe without infection. Remaining integument  unremarkable.  Vasculature:  Dorsalis Pedis pulse 1/4 bilateral. Posterior Tibial pulse  1/4 bilateral.  Capillary fill time <3 sec 1-5 bilateral. Scant hair growth to the level of the digits. Temperature gradient within normal limits. Mild varicosities present bilateral. No edema present bilateral.   Neurology: The patient has intact sensation measured with a 5.07/10g Semmes Weinstein Monofilament at all pedal sites bilateral. Vibratory sensation diminished bilateral with tuning fork. No Babinski sign present bilateral.   Musculoskeletal: Asymptomatic hammertoe pedal deformities noted bilateral. Muscular strength 5/5 in all lower extremity muscular groups bilateral without pain on range of motion . No tenderness with calf compression bilateral.  Assessment and Plan: Problem List Items Addressed This Visit    None    Visit Diagnoses    Pain due to onychomycosis of toenails of both feet    -  Primary   Corn of toe       Diabetes mellitus without complication (HCC)       Hammer toes of both feet          -Examined patient. -Discussed and educated patient on diabetic foot care, especially with  regards to the vascular, neurological and musculoskeletal systems.  -Stressed the importance of good glycemic control and the detriment of not  controlling glucose levels in relation to the foot. -Mechanically debrided all nails 1-5 bilateral using sterile nail nipper and filed with dremel without incident  -Advised patient to refrain from picking at corn/callus and that we will occasionally trim or smooth them for her when she comes to office; today these areas were very minimal and were smoothed with rotary bur -Continue with walker for stability in gait -Answered all patient questions -Patient to return  in 3 months for at risk foot care -Patient advised to call the office if any problems or questions arise in the meantime.  Asencion Islamitorya Caleen Taaffe, DPM

## 2017-08-27 ENCOUNTER — Ambulatory Visit
Admission: RE | Admit: 2017-08-27 | Discharge: 2017-08-27 | Disposition: A | Discharge status: Home / Self Care | Payer: Medicare HMO | Source: Ambulatory Visit | Attending: Family Medicine | Admitting: Family Medicine

## 2017-08-27 ENCOUNTER — Other Ambulatory Visit: Payer: Self-pay | Admitting: Family Medicine

## 2017-08-27 DIAGNOSIS — R195 Other fecal abnormalities: Secondary | ICD-10-CM

## 2017-08-27 DIAGNOSIS — R109 Unspecified abdominal pain: Secondary | ICD-10-CM

## 2017-08-27 DIAGNOSIS — K5900 Constipation, unspecified: Secondary | ICD-10-CM

## 2017-08-27 DIAGNOSIS — R42 Dizziness and giddiness: Secondary | ICD-10-CM | POA: Diagnosis not present

## 2017-09-02 DIAGNOSIS — I130 Hypertensive heart and chronic kidney disease with heart failure and stage 1 through stage 4 chronic kidney disease, or unspecified chronic kidney disease: Secondary | ICD-10-CM | POA: Diagnosis not present

## 2017-09-02 DIAGNOSIS — N183 Chronic kidney disease, stage 3 (moderate): Secondary | ICD-10-CM | POA: Diagnosis not present

## 2017-09-02 DIAGNOSIS — E1142 Type 2 diabetes mellitus with diabetic polyneuropathy: Secondary | ICD-10-CM | POA: Diagnosis not present

## 2017-09-02 DIAGNOSIS — I5022 Chronic systolic (congestive) heart failure: Secondary | ICD-10-CM | POA: Diagnosis not present

## 2017-09-02 DIAGNOSIS — E782 Mixed hyperlipidemia: Secondary | ICD-10-CM | POA: Diagnosis not present

## 2017-09-02 DIAGNOSIS — F322 Major depressive disorder, single episode, severe without psychotic features: Secondary | ICD-10-CM | POA: Diagnosis not present

## 2017-09-02 DIAGNOSIS — R197 Diarrhea, unspecified: Secondary | ICD-10-CM | POA: Diagnosis not present

## 2017-09-04 DIAGNOSIS — E1142 Type 2 diabetes mellitus with diabetic polyneuropathy: Secondary | ICD-10-CM | POA: Diagnosis not present

## 2017-09-08 ENCOUNTER — Encounter (HOSPITAL_COMMUNITY): Payer: Self-pay

## 2017-09-08 ENCOUNTER — Emergency Department (HOSPITAL_COMMUNITY)
Admission: EM | Admit: 2017-09-08 | Discharge: 2017-09-08 | Disposition: A | Discharge status: Home / Self Care | Payer: Medicare HMO | Attending: Emergency Medicine | Admitting: Emergency Medicine

## 2017-09-08 DIAGNOSIS — Z79899 Other long term (current) drug therapy: Secondary | ICD-10-CM | POA: Insufficient documentation

## 2017-09-08 DIAGNOSIS — E876 Hypokalemia: Secondary | ICD-10-CM

## 2017-09-08 DIAGNOSIS — Z7722 Contact with and (suspected) exposure to environmental tobacco smoke (acute) (chronic): Secondary | ICD-10-CM | POA: Insufficient documentation

## 2017-09-08 DIAGNOSIS — E119 Type 2 diabetes mellitus without complications: Secondary | ICD-10-CM | POA: Diagnosis not present

## 2017-09-08 DIAGNOSIS — I110 Hypertensive heart disease with heart failure: Secondary | ICD-10-CM | POA: Insufficient documentation

## 2017-09-08 DIAGNOSIS — R5383 Other fatigue: Secondary | ICD-10-CM | POA: Insufficient documentation

## 2017-09-08 DIAGNOSIS — R197 Diarrhea, unspecified: Secondary | ICD-10-CM | POA: Insufficient documentation

## 2017-09-08 DIAGNOSIS — I5043 Acute on chronic combined systolic (congestive) and diastolic (congestive) heart failure: Secondary | ICD-10-CM | POA: Insufficient documentation

## 2017-09-08 DIAGNOSIS — R4182 Altered mental status, unspecified: Secondary | ICD-10-CM | POA: Diagnosis not present

## 2017-09-08 DIAGNOSIS — Z96642 Presence of left artificial hip joint: Secondary | ICD-10-CM | POA: Diagnosis not present

## 2017-09-08 DIAGNOSIS — R404 Transient alteration of awareness: Secondary | ICD-10-CM | POA: Diagnosis not present

## 2017-09-08 DIAGNOSIS — Z7982 Long term (current) use of aspirin: Secondary | ICD-10-CM | POA: Diagnosis not present

## 2017-09-08 DIAGNOSIS — K591 Functional diarrhea: Secondary | ICD-10-CM | POA: Diagnosis not present

## 2017-09-08 DIAGNOSIS — G933 Postviral fatigue syndrome: Secondary | ICD-10-CM | POA: Diagnosis not present

## 2017-09-08 LAB — CBC WITH DIFFERENTIAL/PLATELET
BASOS ABS: 0 10*3/uL (ref 0.0–0.1)
Basophils Relative: 1 %
EOS ABS: 0.3 10*3/uL (ref 0.0–0.7)
EOS PCT: 4 %
HCT: 36.5 % (ref 36.0–46.0)
Hemoglobin: 12.3 g/dL (ref 12.0–15.0)
LYMPHS PCT: 24 %
Lymphs Abs: 1.8 10*3/uL (ref 0.7–4.0)
MCH: 29.5 pg (ref 26.0–34.0)
MCHC: 33.7 g/dL (ref 30.0–36.0)
MCV: 87.5 fL (ref 78.0–100.0)
Monocytes Absolute: 0.6 10*3/uL (ref 0.1–1.0)
Monocytes Relative: 8 %
NEUTROS PCT: 63 %
Neutro Abs: 4.6 10*3/uL (ref 1.7–7.7)
PLATELETS: 149 10*3/uL — AB (ref 150–400)
RBC: 4.17 MIL/uL (ref 3.87–5.11)
RDW: 14.2 % (ref 11.5–15.5)
WBC: 7.3 10*3/uL (ref 4.0–10.5)

## 2017-09-08 LAB — COMPREHENSIVE METABOLIC PANEL
ALT: 14 U/L (ref 14–54)
AST: 19 U/L (ref 15–41)
Albumin: 3.1 g/dL — ABNORMAL LOW (ref 3.5–5.0)
Alkaline Phosphatase: 52 U/L (ref 38–126)
Anion gap: 8 (ref 5–15)
BILIRUBIN TOTAL: 0.5 mg/dL (ref 0.3–1.2)
BUN: 14 mg/dL (ref 6–20)
CO2: 27 mmol/L (ref 22–32)
Calcium: 8.7 mg/dL — ABNORMAL LOW (ref 8.9–10.3)
Chloride: 104 mmol/L (ref 101–111)
Creatinine, Ser: 1.06 mg/dL — ABNORMAL HIGH (ref 0.44–1.00)
GFR, EST AFRICAN AMERICAN: 51 mL/min — AB (ref >60)
GFR, EST NON AFRICAN AMERICAN: 44 mL/min — AB (ref >60)
Glucose, Bld: 150 mg/dL — ABNORMAL HIGH (ref 65–99)
POTASSIUM: 2.8 mmol/L — AB (ref 3.5–5.1)
Sodium: 139 mmol/L (ref 135–145)
TOTAL PROTEIN: 6.2 g/dL — AB (ref 6.5–8.1)

## 2017-09-08 LAB — I-STAT CHEM 8, ED
BUN: 15 mg/dL (ref 6–20)
CHLORIDE: 106 mmol/L (ref 101–111)
Calcium, Ion: 1.11 mmol/L — ABNORMAL LOW (ref 1.15–1.40)
Creatinine, Ser: 0.9 mg/dL (ref 0.44–1.00)
Glucose, Bld: 157 mg/dL — ABNORMAL HIGH (ref 65–99)
HCT: 33 % — ABNORMAL LOW (ref 36.0–46.0)
Hemoglobin: 11.2 g/dL — ABNORMAL LOW (ref 12.0–15.0)
Potassium: 2.7 mmol/L — CL (ref 3.5–5.1)
SODIUM: 144 mmol/L (ref 135–145)
TCO2: 25 mmol/L (ref 22–32)

## 2017-09-08 MED ORDER — POTASSIUM CHLORIDE CRYS ER 20 MEQ PO TBCR
40.0000 meq | EXTENDED_RELEASE_TABLET | Freq: Once | ORAL | Status: AC
  Administered 2017-09-08: 40 meq via ORAL
  Filled 2017-09-08: qty 2

## 2017-09-08 MED ORDER — POTASSIUM CHLORIDE ER 10 MEQ PO TBCR: 20.0000 meq | EXTENDED_RELEASE_TABLET | Freq: Two times a day (BID) | ORAL | 0 refills | Status: DC

## 2017-09-08 MED ORDER — SODIUM CHLORIDE 0.9 % IV BOLUS (SEPSIS)
1000.0000 mL | Freq: Once | INTRAVENOUS | Status: AC
  Administered 2017-09-08: 1000 mL via INTRAVENOUS

## 2017-09-08 MED ORDER — POTASSIUM CHLORIDE 10 MEQ/100ML IV SOLN
10.0000 meq | INTRAVENOUS | Status: AC
  Administered 2017-09-08 (×2): 10 meq via INTRAVENOUS
  Filled 2017-09-08 (×2): qty 100

## 2017-09-08 MED ORDER — MAGNESIUM SULFATE 2 GM/50ML IV SOLN
2.0000 g | Freq: Once | INTRAVENOUS | Status: AC
  Administered 2017-09-08: 2 g via INTRAVENOUS
  Filled 2017-09-08: qty 50

## 2017-09-08 NOTE — ED Notes (Signed)
Pt verbalizes understanding of d/c instructions. Pt received prescriptions. Pt taken out to lobby in wheelchair at d/c with all belongings.   

## 2017-09-08 NOTE — ED Triage Notes (Signed)
Patient from [facility] for abnormal labs, high potassium and low sodium.  Facility unaware of exact numbers.  A&Ox4 with normal vitals.  No complaints of pain at this time.

## 2017-09-08 NOTE — Discharge Instructions (Signed)
Continue taking imodium to decrease your diarrhea and follow up with your regular doctor regarding the diarrhea and for a recheck of your potassium levels within the next week.

## 2017-09-08 NOTE — ED Provider Notes (Signed)
[facility]   CSN: [id number] Arrival date & time: 09/08/17  1537     History   Chief Complaint Chief Complaint  Patient presents with  . Abnormal Lab    high K, Low Na    HPI [name] is a 83 y.o. female.  HPI   83 year old female with a history of atrial fibrillation, diabetes, left bundle branch block, ischemic cardiomyopathy, hyperlipidemia presents with concern for abnormal labs found at her assisted living facility in setting of having 3 weeks of diarrhea. Patient reports that for the last 3 weeks, she's had 3 or more episodes of diarrhea every day. Denies black or bloody stools. Denies recent antibiotics or sick contacts. Denies vomiting. Reports the digit she does have nausea and a sensation of soreness in her abdomen after having diarrhea. Reports that she's not been eating or drinking as much as, partially due to the concern that she will have the urge to have diarrhea while she is in the dining hall. Reports she has associated fatigue. Denies syncope, lightheadedness, chest pain or dyspnea. Reports that she does have a history of episodes of shortness of breath that she attributed to anxiety, including a prior admission where she had shortness of breath that was worked up by cardiology and thought to be anxiety. Denies fevers, urinary symptoms. Reports that she is urinating less than she usually does. Reports she was told that her potassium levels were high and her sodium levels were low and she is told to come to the emergency department.  Past Medical History:  Diagnosis Date  . Atrial fibrillation (HCC)   . Cholelithiasis   . Constipation   . Diabetes mellitus   . Dizziness    with some gait disability  . GERD (gastroesophageal reflux disease)   . Hepatic cyst   . Hyperlipidemia   . Hypertension   . Ischemic cardiomyopathy   . LBBB (left bundle branch block)   . Macular degeneration   . Other  postprocedural status(V45.89)   . Peripheral edema     Patient Active Problem List   Diagnosis Date Noted  . Normocytic anemia 04/13/2016  . Acute on chronic combined systolic and diastolic CHF (congestive heart failure) (HCC) 04/13/2016  . Pyelonephritis 04/12/2016  . Chronic combined systolic and diastolic congestive heart failure (HCC) 11/14/2013  . Constipation 11/14/2013  . Nausea 11/14/2013  . Influenza with respiratory manifestations 11/09/2013  . AKI (acute kidney injury) (HCC) 11/09/2013  . Influenza 11/07/2013  . Acute on chronic combined systolic and diastolic congestive heart failure (HCC) 11/07/2013  . Acute respiratory failure with hypoxia (HCC) 11/07/2013  . Fever 11/07/2013  . Generalized weakness 11/07/2013  . Peripheral neuropathy 05/05/2012  . Closed left hip fracture (HCC) 05/04/2012  . Chronic systolic HF (heart failure) (HCC) 05/04/2012  . Arthritis 07/23/2011  . CHF (congestive heart failure) (HCC) 07/23/2011  . DYSPNEA 07/31/2010  . DEPRESSIVE DISORDER NOT ELSEWHERE CLASSIFIED 02/08/2009  . Type 2 diabetes mellitus with diabetic neuropathy, without long-term current use of insulin (HCC) 11/08/2008  . HYPERLIPIDEMIA-MIXED 11/08/2008  . Essential hypertension 11/08/2008  . LBBB (left bundle branch block) 11/08/2008  . Atrial fibrillation (HCC) 11/08/2008  . GERD 11/08/2008  . HEPATIC CYST 11/08/2008  . CHOLELITHIASIS 11/08/2008  . DILATION AND CURETTAGE, HX OF 11/08/2008    Past Surgical History:  Procedure Laterality Date  . ANKLE SURGERY Bilateral    fractures  . CHOLECYSTECTOMY    . Dilation and curettage    .  HIP ARTHROPLASTY  05/05/2012   Procedure: ARTHROPLASTY BIPOLAR HIP;  Surgeon: Shelda Pal, MD;  Location: [facility]S;  Service: Orthopedics;  Laterality: Left;  . TONSILLECTOMY    . WRIST SURGERY Bilateral    wrist fractures    OB History    No data available       Home Medications    Prior to Admission medications   Medication  Sig Start Date End Date Taking? Authorizing Provider  acetaminophen (TYLENOL) 325 MG tablet Take 325 mg by mouth every 6 (six) hours as needed (pain).   Yes [provider]  aspirin 81 MG chewable tablet Chew 81 mg by mouth daily.    Yes [provider]  carvedilol (COREG) 6.25 MG tablet Take 6.25 mg by mouth 2 (two) times daily. 09/03/17  Yes [provider]  diazepam (VALIUM) 5 MG tablet Take 5 mg by mouth See admin instructions. Take 1 tablet (5 mg) by mouth daily at bedtime, may also take 1 tablet (5 mg) every morning as needed for anxiety   Yes [provider]  furosemide (LASIX) 40 MG tablet Take 40 mg by mouth daily.   Yes [provider]  guaifenesin (ROBAFEN) 100 MG/5ML syrup Take 100 mg by mouth every 8 (eight) hours as needed for cough.   Yes [provider]  HYDROcodone-acetaminophen (NORCO/VICODIN) 5-325 MG tablet Take 1 tablet by mouth 3 (three) times daily as needed (pain).  03/14/16  Yes [provider]  lactobacillus acidophilus & bulgar (LACTINEX) chewable tablet Chew 1 tablet by mouth daily.   Yes [provider]  loperamide (IMODIUM A-D) 2 MG tablet Take 2-4 mg by mouth See admin instructions. Take 2 tablets (4 mg) by mouth as needed after 1st loose stool followed by 1 tablet (2 mg) after each subsequent loose stool.   Yes [provider]  magnesium hydroxide (MILK OF MAGNESIA) 400 MG/5ML suspension Take 15 mLs by mouth daily as needed (constipation).    Yes [provider]  mirtazapine (REMERON) 30 MG tablet Take 30 mg by mouth at bedtime. 09/03/17  Yes [provider]  Multiple Vitamins-Minerals (PRESERVISION AREDS PO) Take 1 tablet by mouth daily.    Yes [provider]  omeprazole (PRILOSEC) 20 MG capsule Take 20 mg by mouth daily.   Yes [provider]  ondansetron (ZOFRAN) 4 MG tablet Take 4 mg by mouth daily as needed for nausea.  06/09/17  Yes [provider]  sertraline (ZOLOFT) 100 MG tablet Take 100 mg by mouth daily. 08/25/16  Yes [provider]  simvastatin (ZOCOR) 40 MG tablet Take 40 mg by mouth daily.   Yes [provider]  spironolactone (ALDACTONE) 25 MG tablet Take 0.5 tablets (12.5 mg total) by mouth daily. 02/21/17  Yes Wendall Stade, MD  carvedilol (COREG) 12.5 MG tablet TAKE 1 TABLET TWO TIMES DAILY WITH A MEAL. Patient not taking: Reported on 09/08/2017 07/09/17   Wendall Stade, MD    Family History Family History  Problem Relation Age of Onset  . Leukemia Mother   . Arthritis Father   . Heart failure Brother        died age 90  . Arthritis Brother     Social History Social History  Substance Use Topics  . Smoking status: Passive Smoke Exposure - Never Smoker  . 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 Alcohol use No  Allergies   Fosamax [alendronate sodium]; Penicillins; Sulfonamide derivatives; and Tetanus toxoid   Review of Systems Review of Systems  Constitutional: Positive for appetite change and fatigue. Negative for fever.  HENT: Negative for sore throat.   Eyes: Negative for visual disturbance.  Respiratory: Negative for cough and shortness of breath.   Cardiovascular: Negative for chest pain.  Gastrointestinal: Positive for diarrhea and nausea. Negative for abdominal pain, constipation and vomiting.  Genitourinary: Positive for decreased urine volume. Negative for difficulty urinating and dysuria.  Musculoskeletal: Negative for back pain and neck pain.  Skin: Negative for rash.  Neurological: Negative for syncope, light-headedness and headaches.     Physical Exam Updated Vital Signs BP (!) 141/59 (BP Location: Left Arm)   Pulse 60   Temp 98.6 F (37 C) (Oral)   Resp 18   Ht 5\' 10"  (1.778 m)   Wt 87.5 kg (193 lb)   SpO2 96%   BMI 27.69 kg/m   Physical Exam  Constitutional: She is oriented to  person, place, and time. She appears well-developed and well-nourished. No distress.  HENT:  Head: Normocephalic and atraumatic.  Eyes: Conjunctivae and EOM are normal.  Neck: Normal range of motion.  Cardiovascular: Normal rate, regular rhythm, normal heart sounds and intact distal pulses.  Exam reveals no gallop and no friction rub.   No murmur heard. Pulmonary/Chest: Effort normal and breath sounds normal. No respiratory distress. She has no wheezes. She has no rales.  Abdominal: Soft. She exhibits no distension. There is no guarding.  Reports "a little sore" diffusely, soft abdomen, no guarding  Musculoskeletal: She exhibits no edema or tenderness.  Neurological: She is alert and oriented to person, place, and time.  Skin: Skin is warm and dry. No rash noted. She is not diaphoretic. No erythema.  Nursing note and vitals reviewed.    ED Treatments / Results  Labs (all labs ordered are listed, but only abnormal results are displayed) Labs Reviewed  COMPREHENSIVE METABOLIC PANEL - Abnormal; Notable for the following:       Result Value   Potassium 2.8 (*)    Glucose, Bld 150 (*)    Creatinine, Ser 1.06 (*)    Calcium 8.7 (*)    Total Protein 6.2 (*)    Albumin 3.1 (*)    GFR calc non Af Amer 44 (*)    GFR calc Af Amer 51 (*)    All other components within normal limits  CBC WITH DIFFERENTIAL/PLATELET - Abnormal; Notable for the following:    Platelets 149 (*)    All other components within normal limits  I-STAT CHEM 8, ED - Abnormal; Notable for the following:    Potassium 2.7 (*)    Glucose, Bld 157 (*)    Calcium, Ion 1.11 (*)    Hemoglobin 11.2 (*)    HCT 33.0 (*)    All other components within normal limits    EKG  EKG Interpretation  Date/Time:  Monday September 08 2017 16:45:51 EDT Ventricular Rate:  62 PR Interval:    QRS Duration: 170 QT Interval:  484 QTC Calculation: 492 R Axis:   36 Text Interpretation:  Sinus  rhythm with 1st degree AV block Prolonged  PR interval Left bundle branch block No significant change since last tracing Confirmed by Alvira Monday ([id number]) on 09/08/2017 5:33:05 PM       Radiology No results found.  Procedures Procedures (including critical care time)  Medications Ordered in ED Medications  sodium chloride 0.9 % bolus  1,000 mL (not administered)  potassium chloride SA (K-DUR,KLOR-CON) CR tablet 40 mEq (not administered)  potassium chloride 10 mEq in 100 mL IVPB (not administered)  magnesium sulfate IVPB 2 g 50 mL (not administered)     Initial Impression / Assessment and Plan / ED Course  I have reviewed the triage vital signs and the nursing notes.  Pertinent labs & imaging results that were available during my care of the patient were reviewed by me and considered in my medical decision making (see chart for details).     83 year old female with a history of atrial fibrillation, diabetes, left bundle branch block, ischemic cardiomyopathy, hyperlipidemia presents with concern for abnormal labs found at her assisted living facility in setting of having 3 weeks of diarrhea. The facility have reported high potassium levels low sodium levels, however I labs here show potassium of 2.8 with a sodium of 139. Her creatinine is at baseline. Her white blood cells are within normal limits, she has no anemia. Her abdominal exam is benign, and doubt diverticulitis, obstruction or other.  Patient likely with dehydration and hypokalemia secondary to diarrhea over the last 3 weeks. She reports having stool testing done as an outpatient that was within normal limits. In this setting, fields reasonable for her to continue Imodium and follow up with gastroenterology. She is given 1 L of normal saline, potassium 20 mEq IV, 2 g of magnesium IV, and 40 mEq of potassium by mouth. Her EKG shows left bundle branch block without change from prior. She feels improved after fluids and electrolytes in the ED.  Given additional dose of  per mouth prior to discharge (4+ hours after first dose) Feel she is appropriate for outpatient follow-up. Given prescription for potassium, recommend close follow-up with labs with her primary care physician.   Final Clinical Impressions(s) / ED Diagnoses   Final diagnoses:  Diarrhea, unspecified type  Hypokalemia    New Prescriptions New Prescriptions   No medications on file     Alvira Monday, MD 09/08/17 2146

## 2017-09-12 DIAGNOSIS — R197 Diarrhea, unspecified: Secondary | ICD-10-CM | POA: Diagnosis not present

## 2017-10-21 ENCOUNTER — Encounter: Payer: Self-pay | Admitting: Sports Medicine

## 2017-10-21 ENCOUNTER — Ambulatory Visit: Payer: Medicare HMO | Admitting: Sports Medicine

## 2017-10-21 DIAGNOSIS — M79675 Pain in left toe(s): Secondary | ICD-10-CM

## 2017-10-21 DIAGNOSIS — M79674 Pain in right toe(s): Secondary | ICD-10-CM

## 2017-10-21 DIAGNOSIS — E119 Type 2 diabetes mellitus without complications: Secondary | ICD-10-CM

## 2017-10-21 DIAGNOSIS — B351 Tinea unguium: Secondary | ICD-10-CM

## 2017-10-21 DIAGNOSIS — M2041 Other hammer toe(s) (acquired), right foot: Secondary | ICD-10-CM

## 2017-10-21 DIAGNOSIS — M2042 Other hammer toe(s) (acquired), left foot: Secondary | ICD-10-CM

## 2017-10-21 DIAGNOSIS — L84 Corns and callosities: Secondary | ICD-10-CM

## 2017-10-21 NOTE — Progress Notes (Signed)
Subjective: [name] is a 83 y.o. female patient with history of diabetes who presents to office today complaining of long, painful nails  while ambulating in shoes; unable to trim. Patient states that the glucose reading this morning was not recorded; not on meds. No other issues.  Patient is from assisted living [place], [place] at [facility].   Patient Active Problem List   Diagnosis Date Noted  . Normocytic anemia 04/13/2016  . Acute on chronic combined systolic and diastolic CHF (congestive heart failure) ([place]) 04/13/2016  . Pyelonephritis 04/12/2016  . Chronic combined systolic and diastolic congestive heart failure ([place]) 11/14/2013  . Constipation 11/14/2013  . Nausea 11/14/2013  . Influenza with respiratory manifestations 11/09/2013  . AKI (acute kidney injury) ([place]) 11/09/2013  . Influenza 11/07/2013  . Acute on chronic combined systolic and diastolic congestive heart failure ([place]) 11/07/2013  . Acute respiratory failure with hypoxia ([place]) 11/07/2013  . Fever 11/07/2013  . Generalized weakness 11/07/2013  . Peripheral neuropathy 05/05/2012  . Closed left hip fracture ([place]) 05/04/2012  . Chronic systolic HF (heart failure) ([place]) 05/04/2012  . Arthritis 07/23/2011  . CHF (congestive heart failure) ([place]) 07/23/2011  . DYSPNEA 07/31/2010  . DEPRESSIVE DISORDER NOT ELSEWHERE CLASSIFIED 02/08/2009  . Type 2 diabetes mellitus with diabetic neuropathy, without long-term current use of insulin ([place]) 11/08/2008  . HYPERLIPIDEMIA-MIXED 11/08/2008  . Essential hypertension 11/08/2008  . LBBB (left bundle branch block) 11/08/2008  . Atrial fibrillation ([place]) 11/08/2008  . GERD 11/08/2008  . HEPATIC CYST 11/08/2008  . CHOLELITHIASIS 11/08/2008  . DILATION AND CURETTAGE, HX OF 11/08/2008   Current Outpatient Medications on File Prior to Visit  Medication Sig Dispense Refill  . acetaminophen (TYLENOL) 325 MG tablet Take 325 mg by mouth every 6 (six) hours as  needed (pain).    Marland Kitchen aspirin 81 MG chewable tablet Chew 81 mg by mouth daily.     . carvedilol (COREG) 12.5 MG tablet TAKE 1 TABLET TWO TIMES DAILY WITH A MEAL. 180 tablet 2  . carvedilol (COREG) 6.25 MG tablet Take 6.25 mg by mouth 2 (two) times daily.    . diazepam (VALIUM) 5 MG tablet Take 5 mg by mouth See admin instructions. Take 1 tablet (5 mg) by mouth daily at bedtime, may also take 1 tablet (5 mg) every morning as needed for anxiety    . furosemide (LASIX) 40 MG tablet Take 40 mg by mouth daily.    Marland Kitchen guaifenesin (ROBAFEN) 100 MG/5ML syrup Take 100 mg by mouth every 8 (eight) hours as needed for cough.    Marland Kitchen HYDROcodone-acetaminophen (NORCO/VICODIN) 5-325 MG tablet Take 1 tablet by mouth 3 (three) times daily as needed (pain).   0  . lactobacillus acidophilus & bulgar (LACTINEX) chewable tablet Chew 1 tablet by mouth daily.    Marland Kitchen loperamide (IMODIUM A-D) 2 MG tablet Take 2-4 mg by mouth See admin instructions. Take 2 tablets (4 mg) by mouth as needed after 1st loose stool followed by 1 tablet (2 mg) after each subsequent loose stool.    . magnesium hydroxide (MILK OF MAGNESIA) 400 MG/5ML suspension Take 15 mLs by mouth daily as needed (constipation).     . mirtazapine (REMERON) 30 MG tablet Take 30 mg by mouth at bedtime.    . Multiple Vitamins-Minerals (PRESERVISION AREDS PO) Take 1 tablet by mouth daily.     Marland Kitchen omeprazole (PRILOSEC) 20 MG capsule Take 20 mg by mouth daily.    . ondansetron (ZOFRAN) 4 MG tablet Take 4  mg by mouth daily as needed for nausea.     . sertraline (ZOLOFT) 100 MG tablet Take 100 mg by mouth daily.    . simvastatin (ZOCOR) 40 MG tablet Take 40 mg by mouth daily.    Marland Kitchen spironolactone (ALDACTONE) 25 MG tablet Take 0.5 tablets (12.5 mg total) by mouth daily. 45 tablet 1  . potassium chloride (K-DUR) 10 MEQ tablet Take 2 tablets (20 mEq total) by mouth 2 (two) times daily. 12 tablet 0   No current facility-administered medications on file prior to visit.    Allergies   Allergen Reactions  . Fosamax [Alendronate Sodium] Swelling  . Penicillins Swelling and Other (See Comments)    Swelling, redness and warmth at injection site Has patient had a PCN reaction causing immediate rash, facial/tongue/throat swelling, SOB or lightheadedness with hypotension: No Has patient had a PCN reaction causing severe rash involving mucus membranes or skin necrosis: No Has patient had a PCN reaction that required hospitalization: No Has patient had a PCN reaction occurring within the last 10 years: No If all of the above answers are "NO", then may proceed with Cephalosporin use.  . Sulfonamide Derivatives Swelling  . Tetanus Toxoid Swelling    Recent Results (from the past 2160 hour(s))  Comprehensive metabolic panel     Status: Abnormal   Collection Time: 09/08/17  3:53 PM  Result Value Ref Range   Sodium 139 135 - 145 mmol/L   Potassium 2.8 (L) 3.5 - 5.1 mmol/L   Chloride 104 101 - 111 mmol/L   CO2 27 22 - 32 mmol/L   Glucose, Bld 150 (H) 65 - 99 mg/dL   BUN 14 6 - 20 mg/dL   Creatinine, Ser 1.06 (H) 0.44 - 1.00 mg/dL   Calcium 8.7 (L) 8.9 - 10.3 mg/dL   Total Protein 6.2 (L) 6.5 - 8.1 g/dL   Albumin 3.1 (L) 3.5 - 5.0 g/dL   AST 19 15 - 41 U/L   ALT 14 14 - 54 U/L   Alkaline Phosphatase 52 38 - 126 U/L   Total Bilirubin 0.5 0.3 - 1.2 mg/dL   GFR calc non Af Amer 44 (L) >60 mL/min   GFR calc Af Amer 51 (L) >60 mL/min    Comment: (NOTE) The eGFR has been calculated using the CKD EPI equation. This calculation has not been validated in all clinical situations. eGFR's persistently <60 mL/min signify possible Chronic Kidney Disease.    Anion gap 8 5 - 15  CBC with Differential     Status: Abnormal   Collection Time: 09/08/17  3:53 PM  Result Value Ref Range   WBC 7.3 4.0 - 10.5 K/uL   RBC 4.17 3.87 - 5.11 MIL/uL   Hemoglobin 12.3 12.0 - 15.0 g/dL   HCT 36.5 36.0 - 46.0 %   MCV 87.5 78.0 - 100.0 fL   MCH 29.5 26.0 - 34.0 pg   MCHC 33.7 30.0 - 36.0 g/dL    RDW 14.2 11.5 - 15.5 %   Platelets 149 (L) 150 - 400 K/uL   Neutrophils Relative % 63 %   Neutro Abs 4.6 1.7 - 7.7 K/uL   Lymphocytes Relative 24 %   Lymphs Abs 1.8 0.7 - 4.0 K/uL   Monocytes Relative 8 %   Monocytes Absolute 0.6 0.1 - 1.0 K/uL   Eosinophils Relative 4 %   Eosinophils Absolute 0.3 0.0 - 0.7 K/uL   Basophils Relative 1 %   Basophils Absolute 0.0 0.0 - 0.1 K/uL  I-Stat Chem 8, ED     Status: Abnormal   Collection Time: 09/08/17  4:51 PM  Result Value Ref Range   Sodium 144 135 - 145 mmol/L   Potassium 2.7 (LL) 3.5 - 5.1 mmol/L   Chloride 106 101 - 111 mmol/L   BUN 15 6 - 20 mg/dL   Creatinine, Ser 0.90 0.44 - 1.00 mg/dL   Glucose, Bld 157 (H) 65 - 99 mg/dL   Calcium, Ion 1.11 (L) 1.15 - 1.40 mmol/L   TCO2 25 22 - 32 mmol/L   Hemoglobin 11.2 (L) 12.0 - 15.0 g/dL   HCT 33.0 (L) 36.0 - 46.0 %   Comment NOTIFIED PHYSICIAN     Objective: General: Patient is awake, alert, and oriented x 3 and in no acute distress.  Integument: Skin is warm, dry and supple bilateral. Nails are tender, long, thickened and  dystrophic with subungual debris, consistent with onychomycosis, 1-5 bilateral. No signs of infection. No open lesions or preulcerative lesions present bilateral. Minimal corn/callus at left 1st toe and 5th toe without infection. Remaining integument unremarkable.  Vasculature:  Dorsalis Pedis pulse 1/4 bilateral. Posterior Tibial pulse  1/4 bilateral.  Capillary fill time <3 sec 1-5 bilateral. Scant hair growth to the level of the digits. Temperature gradient within normal limits. Mild varicosities present bilateral. No edema present bilateral.   Neurology: The patient has intact sensation measured with a 5.07/10g Semmes Weinstein Monofilament at all pedal sites bilateral. Vibratory sensation diminished bilateral with tuning fork. No Babinski sign present bilateral.   Musculoskeletal: Asymptomatic hammertoe pedal deformities noted bilateral. Muscular strength 5/5 in  all lower extremity muscular groups bilateral without pain on range of motion . No tenderness with calf compression bilateral.  Assessment and Plan: Problem List Items Addressed This Visit    None    Visit Diagnoses    Pain due to onychomycosis of toenails of both feet    -  Primary   Corn of toe       Diabetes mellitus without complication (HCC)       Hammer toes of both feet          -Examined patient. -Discussed and educated patient on diabetic foot care, especially with  regards to the vascular, neurological and musculoskeletal systems.  -Stressed the importance of good glycemic control and the detriment of not  controlling glucose levels in relation to the foot. -Mechanically debrided all nails 1-5 bilateral using sterile nail nipper and filed with dremel without incident  -Today these corn areas were very minimal and were smoothed with rotary bur -Continue with walker for stability in gait -Answered all patient questions -Patient to return  in 3 months for at risk foot care -Patient advised to call the office if any problems or questions arise in the meantime.  Landis Martins, DPM

## 2017-10-24 DIAGNOSIS — L299 Pruritus, unspecified: Secondary | ICD-10-CM | POA: Diagnosis not present

## 2017-10-24 DIAGNOSIS — R197 Diarrhea, unspecified: Secondary | ICD-10-CM | POA: Diagnosis not present

## 2017-10-24 DIAGNOSIS — E876 Hypokalemia: Secondary | ICD-10-CM | POA: Diagnosis not present

## 2017-11-20 NOTE — Progress Notes (Signed)
Patient ID: [name], female   DOB: 31-Aug-1923, 84 y.o.   MRN: [id number]   [name] is seen today for F/U of PAF, LBBB, DCM with EF 20% no CAD by cath in 2009 S/P right hip surgery from fall 2011    04/13/16 hospitalized  with UTI and pyelonephritis with Klebsiella  updated echo reviewed and showed EF 60-65%   02/2017  fell and broke her distal left humerus requiring splint/sling   Less emmotional as her insurance is covering her care at [facility] now Still has frank thoughts about not living Had to spend a day at her daughters house Due to comment about not living   ROS: Denies fever, malais, weight loss, blurry vision, decreased visual acuity, cough, sputum, SOB, hemoptysis, pleuritic pain, palpitaitons, heartburn, abdominal pain, melena, lower extremity edema, claudication, or rash.  All other systems reviewed and negative  General: Depressed and anxious  Frail elderly female  HEENT: normal Neck supple with no adenopathy JVP normal no bruits no thyromegaly Lungs clear with no wheezing and good diaphragmatic motion Heart:  S1/S2 SEM murmur, no rub, gallop or click PMI normal Abdomen: benighn, BS positve, no tenderness, no AAA no bruit.  No HSM or HJR Distal pulses intact with no bruits No edema Neuro non-focal Skin warm and dry Walks with walker     Current Outpatient Medications  Medication Sig Dispense Refill  . acetaminophen (TYLENOL) 325 MG tablet Take 325 mg by mouth every 6 (six) hours as needed (pain).    Marland Kitchen aspirin 81 MG chewable tablet Chew 81 mg by mouth daily.     . carvedilol (COREG) 12.5 MG tablet TAKE 1 TABLET TWO TIMES DAILY WITH A MEAL. 180 tablet 2  . carvedilol (COREG) 6.25 MG tablet Take 6.25 mg by mouth 2 (two) times daily.    . colestipol (COLESTID) 1 g tablet Take 2 tablets by mouth as directed. FOR CONSTIPATION    . diazepam (VALIUM) 5 MG tablet Take 5 mg by mouth See admin instructions. Take 1 tablet (5 mg) by mouth daily at bedtime, may also  take 1 tablet (5 mg) every morning as needed for anxiety    . furosemide (LASIX) 40 MG tablet Take 40 mg by mouth daily.    Marland Kitchen guaifenesin (ROBAFEN) 100 MG/5ML syrup Take 100 mg by mouth every 8 (eight) hours as needed for cough.    Marland Kitchen HYDROcodone-acetaminophen (NORCO/VICODIN) 5-325 MG tablet Take 1 tablet by mouth 3 (three) times daily as needed (pain).   0  . lactobacillus acidophilus & bulgar (LACTINEX) chewable tablet Chew 1 tablet by mouth daily.    Marland Kitchen loperamide (IMODIUM A-D) 2 MG tablet Take 2-4 mg by mouth See admin instructions. Take 2 tablets (4 mg) by mouth as needed after 1st loose stool followed by 1 tablet (2 mg) after each subsequent loose stool.    . magnesium hydroxide (MILK OF MAGNESIA) 400 MG/5ML suspension Take 15 mLs by mouth daily as needed (constipation).     . mirtazapine (REMERON) 30 MG tablet Take 30 mg by mouth at bedtime.    . Multiple Vitamins-Minerals (PRESERVISION AREDS PO) Take 1 tablet by mouth daily.     Marland Kitchen omeprazole (PRILOSEC) 20 MG capsule Take 20 mg by mouth daily.    . ondansetron (ZOFRAN) 4 MG tablet Take 4 mg by mouth daily as needed for nausea.     . potassium chloride (K-DUR,KLOR-CON) 10 MEQ tablet Take 20 mEq by mouth daily.    . sertraline (ZOLOFT) 100 MG  tablet Take 100 mg by mouth daily.    . simvastatin (ZOCOR) 40 MG tablet Take 40 mg by mouth daily.    Marland Kitchen. spironolactone (ALDACTONE) 25 MG tablet Take 0.5 tablets (12.5 mg total) by mouth daily. 45 tablet 1  . potassium chloride (K-DUR) 10 MEQ tablet Take 2 tablets (20 mEq total) by mouth 2 (two) times daily. 12 tablet 0   No current facility-administered medications for this visit.     Allergies  Fosamax [alendronate sodium]; Penicillins; Sulfonamide derivatives; and Tetanus toxoid  Electrocardiogram:  09/08/17 SR LBBB PR 236   Assessment and Plan DCM:  EF normal by echo 04/14/26  Continue beta blocker and diuretics   PAF:  maint NSR  No papitations no anticoagulation due to falls  LBBB:  Stable  chronic no high grade AV block  Depression: zoloft not effective f/u primary and psychiatry   Ortho:  Fractured olecranon healed f/u ortho   Today she confirmed her wish to be DNR   F/U with me in 6 months

## 2017-11-27 ENCOUNTER — Encounter: Payer: Self-pay | Admitting: Cardiovascular Disease

## 2017-11-27 ENCOUNTER — Ambulatory Visit: Payer: Medicare HMO | Admitting: Cardiovascular Disease

## 2017-11-27 VITALS — BP 138/70 | HR 79 | Ht 70.0 in | Wt 204.2 lb

## 2017-11-27 DIAGNOSIS — I420 Dilated cardiomyopathy: Secondary | ICD-10-CM | POA: Diagnosis not present

## 2017-11-27 DIAGNOSIS — I447 Left bundle-branch block, unspecified: Secondary | ICD-10-CM | POA: Diagnosis not present

## 2017-11-27 DIAGNOSIS — I480 Paroxysmal atrial fibrillation: Secondary | ICD-10-CM | POA: Diagnosis not present

## 2017-11-27 NOTE — Patient Instructions (Signed)

## 2017-12-04 ENCOUNTER — Encounter (HOSPITAL_COMMUNITY): Payer: Self-pay | Admitting: Emergency Medicine

## 2017-12-04 ENCOUNTER — Emergency Department (HOSPITAL_COMMUNITY)
Admission: EM | Admit: 2017-12-04 | Discharge: 2017-12-04 | Disposition: A | Discharge status: Home / Self Care | Payer: Medicare HMO | Attending: Emergency Medicine | Admitting: Emergency Medicine

## 2017-12-04 ENCOUNTER — Emergency Department (HOSPITAL_COMMUNITY): Payer: Medicare HMO

## 2017-12-04 ENCOUNTER — Other Ambulatory Visit: Payer: Self-pay

## 2017-12-04 DIAGNOSIS — R0602 Shortness of breath: Secondary | ICD-10-CM | POA: Diagnosis not present

## 2017-12-04 DIAGNOSIS — Z79899 Other long term (current) drug therapy: Secondary | ICD-10-CM | POA: Insufficient documentation

## 2017-12-04 DIAGNOSIS — J209 Acute bronchitis, unspecified: Secondary | ICD-10-CM | POA: Diagnosis not present

## 2017-12-04 DIAGNOSIS — J069 Acute upper respiratory infection, unspecified: Secondary | ICD-10-CM | POA: Insufficient documentation

## 2017-12-04 DIAGNOSIS — I5043 Acute on chronic combined systolic (congestive) and diastolic (congestive) heart failure: Secondary | ICD-10-CM | POA: Insufficient documentation

## 2017-12-04 DIAGNOSIS — Z96642 Presence of left artificial hip joint: Secondary | ICD-10-CM | POA: Insufficient documentation

## 2017-12-04 DIAGNOSIS — I110 Hypertensive heart disease with heart failure: Secondary | ICD-10-CM | POA: Insufficient documentation

## 2017-12-04 DIAGNOSIS — J80 Acute respiratory distress syndrome: Secondary | ICD-10-CM | POA: Diagnosis not present

## 2017-12-04 DIAGNOSIS — E119 Type 2 diabetes mellitus without complications: Secondary | ICD-10-CM | POA: Diagnosis not present

## 2017-12-04 DIAGNOSIS — Z7982 Long term (current) use of aspirin: Secondary | ICD-10-CM | POA: Diagnosis not present

## 2017-12-04 DIAGNOSIS — Z7722 Contact with and (suspected) exposure to environmental tobacco smoke (acute) (chronic): Secondary | ICD-10-CM | POA: Diagnosis not present

## 2017-12-04 DIAGNOSIS — I447 Left bundle-branch block, unspecified: Secondary | ICD-10-CM | POA: Diagnosis not present

## 2017-12-04 DIAGNOSIS — R05 Cough: Secondary | ICD-10-CM | POA: Diagnosis not present

## 2017-12-04 LAB — CBC WITH DIFFERENTIAL/PLATELET
BASOS ABS: 0 10*3/uL (ref 0.0–0.1)
Basophils Relative: 0 %
Eosinophils Absolute: 0.2 10*3/uL (ref 0.0–0.7)
Eosinophils Relative: 4 %
HEMATOCRIT: 40.9 % (ref 36.0–46.0)
Hemoglobin: 13.4 g/dL (ref 12.0–15.0)
LYMPHS PCT: 13 %
Lymphs Abs: 0.9 10*3/uL (ref 0.7–4.0)
MCH: 30.1 pg (ref 26.0–34.0)
MCHC: 32.8 g/dL (ref 30.0–36.0)
MCV: 91.9 fL (ref 78.0–100.0)
MONO ABS: 1 10*3/uL (ref 0.1–1.0)
Monocytes Relative: 15 %
NEUTROS ABS: 4.6 10*3/uL (ref 1.7–7.7)
Neutrophils Relative %: 68 %
Platelets: 137 10*3/uL — ABNORMAL LOW (ref 150–400)
RBC: 4.45 MIL/uL (ref 3.87–5.11)
RDW: 13.5 % (ref 11.5–15.5)
WBC: 6.8 10*3/uL (ref 4.0–10.5)

## 2017-12-04 LAB — I-STAT CG4 LACTIC ACID, ED: Lactic Acid, Venous: 1.82 mmol/L (ref 0.5–1.9)

## 2017-12-04 LAB — BASIC METABOLIC PANEL
ANION GAP: 12 (ref 5–15)
BUN: 16 mg/dL (ref 6–20)
CO2: 26 mmol/L (ref 22–32)
Calcium: 8.9 mg/dL (ref 8.9–10.3)
Chloride: 103 mmol/L (ref 101–111)
Creatinine, Ser: 1.23 mg/dL — ABNORMAL HIGH (ref 0.44–1.00)
GFR calc Af Amer: 43 mL/min — ABNORMAL LOW (ref >60)
GFR calc non Af Amer: 37 mL/min — ABNORMAL LOW (ref >60)
GLUCOSE: 129 mg/dL — AB (ref 65–99)
POTASSIUM: 3.7 mmol/L (ref 3.5–5.1)
Sodium: 141 mmol/L (ref 135–145)

## 2017-12-04 MED ORDER — AEROCHAMBER PLUS FLO-VU LARGE MISC
Status: AC
  Filled 2017-12-04: qty 1

## 2017-12-04 MED ORDER — BENZONATATE 100 MG PO CAPS: 100.0000 mg | ORAL_CAPSULE | Freq: Three times a day (TID) | ORAL | 0 refills | Status: DC | PRN

## 2017-12-04 MED ORDER — IPRATROPIUM-ALBUTEROL 0.5-2.5 (3) MG/3ML IN SOLN
3.0000 mL | Freq: Once | RESPIRATORY_TRACT | Status: AC
  Administered 2017-12-04: 3 mL via RESPIRATORY_TRACT
  Filled 2017-12-04: qty 3

## 2017-12-04 MED ORDER — ALBUTEROL SULFATE HFA 108 (90 BASE) MCG/ACT IN AERS
2.0000 | INHALATION_SPRAY | RESPIRATORY_TRACT | Status: DC | PRN
  Administered 2017-12-04: 2 via RESPIRATORY_TRACT
  Filled 2017-12-04: qty 6.7

## 2017-12-04 MED ORDER — PREDNISONE 20 MG PO TABS: 40.0000 mg | ORAL_TABLET | Freq: Every day | ORAL | 0 refills | Status: DC

## 2017-12-04 MED ORDER — AEROCHAMBER PLUS FLO-VU LARGE MISC
1.0000 | Freq: Once | Status: AC
  Administered 2017-12-04: 1

## 2017-12-04 MED ORDER — IPRATROPIUM-ALBUTEROL 0.5-2.5 (3) MG/3ML IN SOLN
3.0000 mL | Freq: Once | RESPIRATORY_TRACT | Status: AC
  Administered 2017-12-04: 3 mL via RESPIRATORY_TRACT

## 2017-12-04 MED ORDER — IPRATROPIUM-ALBUTEROL 0.5-2.5 (3) MG/3ML IN SOLN
RESPIRATORY_TRACT | Status: AC
  Filled 2017-12-04: qty 3

## 2017-12-04 NOTE — ED Notes (Signed)
[facility] called for transport.  

## 2017-12-04 NOTE — ED Provider Notes (Signed)
[facility] EMERGENCY DEPARTMENT Provider Note   CSN: [id number] Arrival date & time: 12/04/17  3664     History   Chief Complaint Chief Complaint  Patient presents with  . Shortness of Breath    HPI [name] is a 84 y.o. female.  Patient presents to the emergency department for evaluation of cough and shortness of breath.  Patient reports that she started having cough yesterday.  She coughed most of the day.  She was provided a cough medicine at the assisted living but it did not help.  She reports that tonight around midnight she started having shortness of breath.  Her cough has worsened, she reports bringing up a lot of clear phlegm and mucus.  She has also developed a sore throat and hoarse voice.  She reports last time she felt like that she had "double pneumonia".      Past Medical History:  Diagnosis Date  . Atrial fibrillation (HCC)   . Cholelithiasis   . Constipation   . Diabetes mellitus   . Dizziness    with some gait disability  . GERD (gastroesophageal reflux disease)   . Hepatic cyst   . Hyperlipidemia   . Hypertension   . Ischemic cardiomyopathy   . LBBB (left bundle branch block)   . Macular degeneration   . Other postprocedural status(V45.89)   . Peripheral edema     Patient Active Problem List   Diagnosis Date Noted  . Normocytic anemia 04/13/2016  . Acute on chronic combined systolic and diastolic CHF (congestive heart failure) (HCC) 04/13/2016  . Pyelonephritis 04/12/2016  . Chronic combined systolic and diastolic congestive heart failure (HCC) 11/14/2013  . Constipation 11/14/2013  . Nausea 11/14/2013  . Influenza with respiratory manifestations 11/09/2013  . AKI (acute kidney injury) (HCC) 11/09/2013  . Influenza 11/07/2013  . Acute on chronic combined systolic and diastolic congestive heart failure (HCC) 11/07/2013  . Acute respiratory failure with hypoxia (HCC) 11/07/2013  . Fever 11/07/2013  .  Generalized weakness 11/07/2013  . Peripheral neuropathy 05/05/2012  . Closed left hip fracture (HCC) 05/04/2012  . Chronic systolic HF (heart failure) (HCC) 05/04/2012  . Arthritis 07/23/2011  . CHF (congestive heart failure) (HCC) 07/23/2011  . DYSPNEA 07/31/2010  . DEPRESSIVE DISORDER NOT ELSEWHERE CLASSIFIED 02/08/2009  . Type 2 diabetes mellitus with diabetic neuropathy, without long-term current use of insulin (HCC) 11/08/2008  . HYPERLIPIDEMIA-MIXED 11/08/2008  . Essential hypertension 11/08/2008  . LBBB (left bundle branch block) 11/08/2008  . Atrial fibrillation (HCC) 11/08/2008  . GERD 11/08/2008  . HEPATIC CYST 11/08/2008  . CHOLELITHIASIS 11/08/2008  . DILATION AND CURETTAGE, HX OF 11/08/2008    Past Surgical History:  Procedure Laterality Date  . ANKLE SURGERY Bilateral    fractures  . CHOLECYSTECTOMY    . Dilation and curettage    . HIP ARTHROPLASTY  05/05/2012   Procedure: ARTHROPLASTY BIPOLAR HIP;  Surgeon: Shelda Pal, MD;  Location: [facility];  Service: Orthopedics;  Laterality: Left;  . TONSILLECTOMY    . WRIST SURGERY Bilateral    wrist fractures    OB History    No data available       Home Medications    Prior to Admission medications   Medication Sig Start Date End Date Taking? Authorizing Provider  acetaminophen (TYLENOL) 325 MG tablet Take 325 mg by mouth every 6 (six) hours as needed (pain).    [provider]  aspirin 81 MG chewable tablet Chew 81 mg by  mouth daily.     [provider]  benzonatate (TESSALON) 100 MG capsule Take 1 capsule (100 mg total) by mouth 3 (three) times daily as needed for cough. 12/04/17   Gilda Crease, MD  carvedilol (COREG) 6.25 MG tablet Take 6.25 mg by mouth 2 (two) times daily. 09/03/17   [provider]  colestipol (COLESTID) 1 g tablet Take 2 tablets by mouth as directed. FOR CONSTIPATION 11/26/17   [provider]  diazepam (VALIUM) 5 MG tablet Take 5 mg by mouth See  admin instructions. Take 1 tablet (5 mg) by mouth daily at bedtime, may also take 1 tablet (5 mg) every morning as needed for anxiety    [provider]  furosemide (LASIX) 40 MG tablet Take 40 mg by mouth daily.    [provider]  guaifenesin (ROBAFEN) 100 MG/5ML syrup Take 100 mg by mouth every 8 (eight) hours as needed for cough.    [provider]  HYDROcodone-acetaminophen (NORCO/VICODIN) 5-325 MG tablet Take 1 tablet by mouth 3 (three) times daily as needed (pain).  03/14/16   [provider]  lactobacillus acidophilus & bulgar (LACTINEX) chewable tablet Chew 1 tablet by mouth daily.    [provider]  loperamide (IMODIUM A-D) 2 MG tablet Take 2-4 mg by mouth See admin instructions. Take 2 tablets (4 mg) by mouth as needed after 1st loose stool followed by 1 tablet (2 mg) after each subsequent loose stool.    [provider]  magnesium hydroxide (MILK OF MAGNESIA) 400 MG/5ML suspension Take 15 mLs by mouth daily as needed (constipation).     [provider]  mirtazapine (REMERON) 30 MG tablet Take 30 mg by mouth at bedtime. 09/03/17   [provider]  Multiple Vitamins-Minerals (PRESERVISION AREDS PO) Take 1 tablet by mouth daily.     [provider]  omeprazole (PRILOSEC) 20 MG capsule Take 20 mg by mouth daily.    [provider]  ondansetron (ZOFRAN) 4 MG tablet Take 4 mg by mouth daily as needed for nausea.  06/09/17   [provider]  potassium chloride (K-DUR) 10 MEQ tablet Take 2 tablets (20 mEq total) by mouth 2 (two) times daily. 09/08/17 09/11/17  Alvira Monday, MD  potassium chloride (K-DUR,KLOR-CON) 10 MEQ tablet Take 20 mEq by mouth daily.    [provider]  predniSONE (DELTASONE) 20 MG tablet Take 2 tablets (40 mg total) by mouth daily with breakfast. 12/04/17   Pollina, Canary Brim, MD  sertraline (ZOLOFT) 100 MG tablet Take 100 mg by mouth daily. 08/25/16   [provider]  simvastatin (ZOCOR) 40 MG tablet Take 40 mg by mouth daily.    [provider]  spironolactone (ALDACTONE) 25 MG tablet Take 0.5 tablets (12.5 mg total) by mouth daily. 02/21/17   Wendall Stade, MD    Family History Family History  Problem Relation Age of Onset  . Leukemia Mother   . Arthritis Father   . Heart failure Brother        died age 36  . Arthritis Brother     Social History Social History   Tobacco Use  . Smoking status: Passive Smoke Exposure - Never Smoker  . Smokeless tobacco: Never Used  . 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 Alcohol use: No  . Drug use: No     Allergies   Fosamax [alendronate sodium]; Penicillins; Sulfonamide derivatives; and Tetanus toxoid  Review of Systems Review of Systems  HENT: Positive for sore throat.   Respiratory: Positive for cough and shortness of breath.   All other systems reviewed and are negative.    Physical Exam Updated Vital Signs BP (!) 149/63   Pulse 69   Temp 99.1 F (37.3 C)   Resp 11   SpO2 94%   Physical Exam  Constitutional: She is oriented to person, place, and time. She appears well-developed and well-nourished. No distress.  HENT:  Head: Normocephalic and atraumatic.  Right Ear: Hearing normal.  Left Ear: Hearing normal.  Nose: Nose normal.  Mouth/Throat: Oropharynx is clear and moist and mucous membranes are normal.  Eyes: Conjunctivae and EOM are normal. Pupils are equal, round, and reactive to light.  Neck: Normal range of motion. Neck supple.  Cardiovascular: Regular rhythm, S1 normal and S2 normal. Exam reveals no gallop and no friction rub.  No murmur heard. Pulmonary/Chest: Effort normal and breath sounds normal. No respiratory distress. She exhibits no tenderness.  Abdominal: Soft. Normal appearance and bowel sounds are normal. There is no hepatosplenomegaly. There is no tenderness. There is no rebound, no  guarding, no tenderness at McBurney's point and negative Murphy's sign. No hernia.  Musculoskeletal: Normal range of motion.  Neurological: She is alert and oriented to person, place, and time. She has normal strength. No cranial nerve deficit or sensory deficit. Coordination normal. GCS eye subscore is 4. GCS verbal subscore is 5. GCS motor subscore is 6.  Skin: Skin is warm, dry and intact. No rash noted. No cyanosis.  Psychiatric: She has a normal mood and affect. Her speech is normal and behavior is normal. Thought content normal.  Nursing note and vitals reviewed.    ED Treatments / Results  Labs (all labs ordered are listed, but only abnormal results are displayed) Labs Reviewed  CBC WITH DIFFERENTIAL/PLATELET - Abnormal; Notable for the following components:      Result Value   Platelets 137 (*)    All other components within normal limits  BASIC METABOLIC PANEL - Abnormal; Notable for the following components:   Glucose, Bld 129 (*)    Creatinine, Ser 1.23 (*)    GFR calc non Af Amer 37 (*)    GFR calc Af Amer 43 (*)    All other components within normal limits  I-STAT CG4 LACTIC ACID, ED    EKG  EKG Interpretation  Date/Time:  Thursday December 04 2017 02:17:49 EST Ventricular Rate:  78 PR Interval:    QRS Duration: 156 QT Interval:  443 QTC Calculation: 505 R Axis:   -24 Text Interpretation:  Sinus rhythm Prolonged PR interval Left bundle branch block No significant change since last tracing Confirmed by Gilda Crease [phone]) on 12/04/2017 2:27:43 AM       Radiology Dg Chest 2 View  Result Date: 12/04/2017 CLINICAL DATA:  84 year old female with cough. EXAM: CHEST  2 VIEW COMPARISON:  Chest radiograph dated 05/06/2016 FINDINGS: The lungs are clear. There is no pleural effusion or pneumothorax. The cardiac silhouette is within normal limits. Atherosclerotic calcification of the aortic arch. Osteopenia with degenerative changes of the spine. No acute osseous  pathology. IMPRESSION: No active cardiopulmonary disease. Electronically Signed   By: Elgie Collard M.D.   On: 12/04/2017 02:55    Procedures Procedures (including critical care time)  Medications Ordered in ED Medications  albuterol (PROVENTIL HFA;VENTOLIN HFA) 108 (90 Base) MCG/ACT inhaler 2 puff (2 puffs Inhalation Provided for home use 12/04/17 0447)  ipratropium-albuterol (DUONEB) 0.5-2.5 (3) MG/3ML nebulizer solution (not administered)  ipratropium-albuterol (DUONEB) 0.5-2.5 (3) MG/3ML nebulizer solution 3 mL (3 mLs Nebulization Given 12/04/17 0259)  AEROCHAMBER PLUS FLO-VU LARGE MISC 1 each (1 each Other Provided for home use 12/04/17 0447)  ipratropium-albuterol (DUONEB) 0.5-2.5 (3) MG/3ML nebulizer solution 3 mL (3 mLs Nebulization Given 12/04/17 0550)     Initial Impression / Assessment and Plan / ED Course  I have reviewed the triage vital signs and the nursing notes.  Pertinent labs & imaging results that were available during my care of the patient were reviewed by me and considered in my medical decision making (see chart for details).     She presents to the emergency department for evaluation of cough and shortness of breath.  Patient reports upper respiratory infection symptoms for 1 or 2 days with worsening tonight.  She has sore throat, laryngitis and persistent cough productive of clear mucus.  Blood work unremarkable.  Room air oxygen saturation is 98%.  She is not in respiratory distress.  All vitals are within normal limits.  Chest x-ray does not show evidence of pneumonia.  She is symptomatically improved after nebulizer treatment.  Patient will be discharged with symptomatic treatment, albuterol, prednisone, Tessalon Perles.  Final Clinical Impressions(s) / ED Diagnoses   Final diagnoses:  Upper respiratory tract infection, unspecified type    ED Discharge Orders        Ordered    benzonatate (TESSALON) 100 MG capsule  3 times daily PRN     12/04/17 0405     predniSONE (DELTASONE) 20 MG tablet  Daily with breakfast     12/04/17 0405       Gilda CreasePollina, Christopher J, MD 12/04/17 [phone]

## 2017-12-04 NOTE — ED Notes (Signed)
Signature pad unavailable at time of discharge. Pt verbalized understanding of discharge instructions.

## 2017-12-04 NOTE — ED Triage Notes (Addendum)
Pt complaints of cough and sore throat for a few days, worsening yesterday morning and tonight around midnight awoke and "couldn't breathe".  Pt has a history of CHF. Does not require O2 at home.  Saw cardiologist last week and was doing well.  Clear mucus produced.  [facility] is from [facility]

## 2017-12-09 ENCOUNTER — Encounter (HOSPITAL_COMMUNITY): Payer: Self-pay | Admitting: Emergency Medicine

## 2017-12-09 ENCOUNTER — Other Ambulatory Visit: Payer: Self-pay

## 2017-12-09 ENCOUNTER — Emergency Department (HOSPITAL_COMMUNITY): Payer: Medicare HMO

## 2017-12-09 ENCOUNTER — Inpatient Hospital Stay (HOSPITAL_COMMUNITY)
Admission: EM | Admit: 2017-12-09 | Discharge: 2017-12-11 | DRG: 202 | Disposition: A | Discharge status: Nursing Home (Includes ICF) | Payer: Medicare HMO | Attending: Internal Medicine | Admitting: Internal Medicine

## 2017-12-09 DIAGNOSIS — I447 Left bundle-branch block, unspecified: Secondary | ICD-10-CM | POA: Diagnosis present

## 2017-12-09 DIAGNOSIS — K219 Gastro-esophageal reflux disease without esophagitis: Secondary | ICD-10-CM | POA: Diagnosis present

## 2017-12-09 DIAGNOSIS — R296 Repeated falls: Secondary | ICD-10-CM | POA: Diagnosis present

## 2017-12-09 DIAGNOSIS — R4182 Altered mental status, unspecified: Secondary | ICD-10-CM | POA: Diagnosis not present

## 2017-12-09 DIAGNOSIS — F419 Anxiety disorder, unspecified: Secondary | ICD-10-CM | POA: Diagnosis not present

## 2017-12-09 DIAGNOSIS — J45901 Unspecified asthma with (acute) exacerbation: Principal | ICD-10-CM | POA: Diagnosis present

## 2017-12-09 DIAGNOSIS — R0602 Shortness of breath: Secondary | ICD-10-CM | POA: Diagnosis not present

## 2017-12-09 DIAGNOSIS — I110 Hypertensive heart disease with heart failure: Secondary | ICD-10-CM | POA: Diagnosis present

## 2017-12-09 DIAGNOSIS — Z79899 Other long term (current) drug therapy: Secondary | ICD-10-CM | POA: Diagnosis not present

## 2017-12-09 DIAGNOSIS — R069 Unspecified abnormalities of breathing: Secondary | ICD-10-CM | POA: Diagnosis not present

## 2017-12-09 DIAGNOSIS — Z66 Do not resuscitate: Secondary | ICD-10-CM | POA: Diagnosis present

## 2017-12-09 DIAGNOSIS — J9600 Acute respiratory failure, unspecified whether with hypoxia or hypercapnia: Secondary | ICD-10-CM | POA: Diagnosis present

## 2017-12-09 DIAGNOSIS — H3530 Unspecified macular degeneration: Secondary | ICD-10-CM | POA: Diagnosis present

## 2017-12-09 DIAGNOSIS — J90 Pleural effusion, not elsewhere classified: Secondary | ICD-10-CM | POA: Diagnosis not present

## 2017-12-09 DIAGNOSIS — J9601 Acute respiratory failure with hypoxia: Secondary | ICD-10-CM | POA: Diagnosis present

## 2017-12-09 DIAGNOSIS — R269 Unspecified abnormalities of gait and mobility: Secondary | ICD-10-CM | POA: Diagnosis present

## 2017-12-09 DIAGNOSIS — F329 Major depressive disorder, single episode, unspecified: Secondary | ICD-10-CM

## 2017-12-09 DIAGNOSIS — I255 Ischemic cardiomyopathy: Secondary | ICD-10-CM | POA: Diagnosis present

## 2017-12-09 DIAGNOSIS — I5042 Chronic combined systolic (congestive) and diastolic (congestive) heart failure: Secondary | ICD-10-CM | POA: Diagnosis present

## 2017-12-09 DIAGNOSIS — E785 Hyperlipidemia, unspecified: Secondary | ICD-10-CM | POA: Diagnosis present

## 2017-12-09 DIAGNOSIS — J45909 Unspecified asthma, uncomplicated: Secondary | ICD-10-CM

## 2017-12-09 DIAGNOSIS — I4891 Unspecified atrial fibrillation: Secondary | ICD-10-CM | POA: Diagnosis present

## 2017-12-09 DIAGNOSIS — F32A Depression, unspecified: Secondary | ICD-10-CM

## 2017-12-09 DIAGNOSIS — Z7982 Long term (current) use of aspirin: Secondary | ICD-10-CM

## 2017-12-09 DIAGNOSIS — I480 Paroxysmal atrial fibrillation: Secondary | ICD-10-CM | POA: Diagnosis present

## 2017-12-09 DIAGNOSIS — J4521 Mild intermittent asthma with (acute) exacerbation: Secondary | ICD-10-CM

## 2017-12-09 DIAGNOSIS — I10 Essential (primary) hypertension: Secondary | ICD-10-CM | POA: Diagnosis present

## 2017-12-09 DIAGNOSIS — R0902 Hypoxemia: Secondary | ICD-10-CM

## 2017-12-09 DIAGNOSIS — R05 Cough: Secondary | ICD-10-CM | POA: Diagnosis not present

## 2017-12-09 DIAGNOSIS — J4551 Severe persistent asthma with (acute) exacerbation: Secondary | ICD-10-CM | POA: Diagnosis not present

## 2017-12-09 DIAGNOSIS — R062 Wheezing: Secondary | ICD-10-CM | POA: Diagnosis not present

## 2017-12-09 DIAGNOSIS — E1140 Type 2 diabetes mellitus with diabetic neuropathy, unspecified: Secondary | ICD-10-CM | POA: Diagnosis present

## 2017-12-09 DIAGNOSIS — Z96642 Presence of left artificial hip joint: Secondary | ICD-10-CM | POA: Diagnosis present

## 2017-12-09 DIAGNOSIS — I482 Chronic atrial fibrillation: Secondary | ICD-10-CM | POA: Diagnosis not present

## 2017-12-09 LAB — GLUCOSE, CAPILLARY
GLUCOSE-CAPILLARY: 268 mg/dL — AB (ref 65–99)
GLUCOSE-CAPILLARY: 245 mg/dL — AB (ref 65–99)

## 2017-12-09 LAB — COMPREHENSIVE METABOLIC PANEL
ALBUMIN: 3.2 g/dL — AB (ref 3.5–5.0)
ALT: 20 U/L (ref 14–54)
ANION GAP: 14 (ref 5–15)
AST: 29 U/L (ref 15–41)
Alkaline Phosphatase: 55 U/L (ref 38–126)
BILIRUBIN TOTAL: 0.8 mg/dL (ref 0.3–1.2)
BUN: 19 mg/dL (ref 6–20)
CO2: 24 mmol/L (ref 22–32)
Calcium: 8.8 mg/dL — ABNORMAL LOW (ref 8.9–10.3)
Chloride: 103 mmol/L (ref 101–111)
Creatinine, Ser: 1.09 mg/dL — ABNORMAL HIGH (ref 0.44–1.00)
GFR, EST AFRICAN AMERICAN: 49 mL/min — AB (ref >60)
GFR, EST NON AFRICAN AMERICAN: 43 mL/min — AB (ref >60)
Glucose, Bld: 130 mg/dL — ABNORMAL HIGH (ref 65–99)
POTASSIUM: 3.6 mmol/L (ref 3.5–5.1)
Sodium: 141 mmol/L (ref 135–145)
TOTAL PROTEIN: 6.5 g/dL (ref 6.5–8.1)

## 2017-12-09 LAB — I-STAT CG4 LACTIC ACID, ED: LACTIC ACID, VENOUS: 1.79 mmol/L (ref 0.5–1.9)

## 2017-12-09 LAB — I-STAT ARTERIAL BLOOD GAS, ED
ACID-BASE DEFICIT: 1 mmol/L (ref 0.0–2.0)
BICARBONATE: 22.7 mmol/L (ref 20.0–28.0)
O2 SAT: 91 %
PO2 ART: 58 mmHg — AB (ref 83.0–108.0)
TCO2: 24 mmol/L (ref 22–32)
pCO2 arterial: 34.1 mmHg (ref 32.0–48.0)
pH, Arterial: 7.429 (ref 7.350–7.450)

## 2017-12-09 LAB — INFLUENZA PANEL BY PCR (TYPE A & B)
INFLAPCR: NEGATIVE
Influenza B By PCR: NEGATIVE

## 2017-12-09 LAB — CBC WITH DIFFERENTIAL/PLATELET
BASOS ABS: 0 10*3/uL (ref 0.0–0.1)
BASOS PCT: 1 %
Eosinophils Absolute: 0.2 10*3/uL (ref 0.0–0.7)
Eosinophils Relative: 2 %
HEMATOCRIT: 41 % (ref 36.0–46.0)
Hemoglobin: 14 g/dL (ref 12.0–15.0)
LYMPHS PCT: 31 %
Lymphs Abs: 2.3 10*3/uL (ref 0.7–4.0)
MCH: 31 pg (ref 26.0–34.0)
MCHC: 34.1 g/dL (ref 30.0–36.0)
MCV: 90.9 fL (ref 78.0–100.0)
Monocytes Absolute: 1.2 10*3/uL — ABNORMAL HIGH (ref 0.1–1.0)
Monocytes Relative: 16 %
NEUTROS ABS: 3.8 10*3/uL (ref 1.7–7.7)
NEUTROS PCT: 50 %
Platelets: 130 10*3/uL — ABNORMAL LOW (ref 150–400)
RBC: 4.51 MIL/uL (ref 3.87–5.11)
RDW: 13.5 % (ref 11.5–15.5)
WBC: 7.5 10*3/uL (ref 4.0–10.5)

## 2017-12-09 LAB — TROPONIN I: Troponin I: <0.03 ng/mL (ref <0.03)

## 2017-12-09 LAB — BRAIN NATRIURETIC PEPTIDE: B NATRIURETIC PEPTIDE 5: 45.8 pg/mL (ref 0.0–100.0)

## 2017-12-09 LAB — MRSA PCR SCREENING: MRSA by PCR: NEGATIVE

## 2017-12-09 MED ORDER — IOPAMIDOL (ISOVUE-300) INJECTION 61%
INTRAVENOUS | Status: AC
  Administered 2017-12-09: 60 mL
  Filled 2017-12-09: qty 75

## 2017-12-09 MED ORDER — POTASSIUM CHLORIDE IN NACL 20-0.9 MEQ/L-% IV SOLN
INTRAVENOUS | Status: DC
  Administered 2017-12-09 – 2017-12-10 (×2): via INTRAVENOUS
  Filled 2017-12-09 (×3): qty 1000

## 2017-12-09 MED ORDER — DOXYCYCLINE HYCLATE 100 MG PO TABS
100.0000 mg | ORAL_TABLET | Freq: Two times a day (BID) | ORAL | Status: DC
  Administered 2017-12-09 – 2017-12-11 (×4): 100 mg via ORAL
  Filled 2017-12-09 (×4): qty 1

## 2017-12-09 MED ORDER — INSULIN ASPART 100 UNIT/ML ~~LOC~~ SOLN
0.0000 [IU] | Freq: Three times a day (TID) | SUBCUTANEOUS | Status: DC
  Administered 2017-12-09: 5 [IU] via SUBCUTANEOUS
  Administered 2017-12-10 (×2): 2 [IU] via SUBCUTANEOUS
  Administered 2017-12-11: 1 [IU] via SUBCUTANEOUS

## 2017-12-09 MED ORDER — SIMVASTATIN 40 MG PO TABS
40.0000 mg | ORAL_TABLET | Freq: Every day | ORAL | Status: DC
  Administered 2017-12-09 – 2017-12-10 (×2): 40 mg via ORAL
  Filled 2017-12-09 (×2): qty 1

## 2017-12-09 MED ORDER — ALBUTEROL (5 MG/ML) CONTINUOUS INHALATION SOLN
10.0000 mg/h | INHALATION_SOLUTION | Freq: Once | RESPIRATORY_TRACT | Status: AC
  Administered 2017-12-09: 10 mg/h via RESPIRATORY_TRACT

## 2017-12-09 MED ORDER — ONDANSETRON HCL 4 MG PO TABS: 4.0000 mg | ORAL_TABLET | Freq: Every day | ORAL | Status: DC | PRN

## 2017-12-09 MED ORDER — METHYLPREDNISOLONE SODIUM SUCC 125 MG IJ SOLR
125.0000 mg | Freq: Once | INTRAMUSCULAR | Status: AC
  Administered 2017-12-09: 125 mg via INTRAVENOUS
  Filled 2017-12-09: qty 2

## 2017-12-09 MED ORDER — SPIRONOLACTONE 12.5 MG HALF TABLET
12.5000 mg | ORAL_TABLET | Freq: Every day | ORAL | Status: DC
  Administered 2017-12-09 – 2017-12-11 (×3): 12.5 mg via ORAL
  Filled 2017-12-09 (×3): qty 1

## 2017-12-09 MED ORDER — PREDNISONE 20 MG PO TABS
40.0000 mg | ORAL_TABLET | Freq: Every day | ORAL | Status: DC
  Administered 2017-12-10 – 2017-12-11 (×2): 40 mg via ORAL
  Filled 2017-12-09 (×2): qty 2

## 2017-12-09 MED ORDER — MIRTAZAPINE 30 MG PO TABS
30.0000 mg | ORAL_TABLET | Freq: Every day | ORAL | Status: DC
  Administered 2017-12-09 – 2017-12-10 (×2): 30 mg via ORAL
  Filled 2017-12-09 (×3): qty 1

## 2017-12-09 MED ORDER — IPRATROPIUM-ALBUTEROL 0.5-2.5 (3) MG/3ML IN SOLN
3.0000 mL | Freq: Four times a day (QID) | RESPIRATORY_TRACT | Status: DC
  Administered 2017-12-09 – 2017-12-11 (×7): 3 mL via RESPIRATORY_TRACT
  Filled 2017-12-09 (×8): qty 3

## 2017-12-09 MED ORDER — ALBUTEROL SULFATE (2.5 MG/3ML) 0.083% IN NEBU: 2.5000 mg | INHALATION_SOLUTION | RESPIRATORY_TRACT | Status: DC | PRN

## 2017-12-09 MED ORDER — CARVEDILOL 12.5 MG PO TABS
6.2500 mg | ORAL_TABLET | Freq: Two times a day (BID) | ORAL | Status: DC
  Administered 2017-12-09 – 2017-12-11 (×4): 6.25 mg via ORAL
  Filled 2017-12-09 (×4): qty 1

## 2017-12-09 MED ORDER — ENOXAPARIN SODIUM 40 MG/0.4ML ~~LOC~~ SOLN
40.0000 mg | SUBCUTANEOUS | Status: DC
  Administered 2017-12-09 – 2017-12-10 (×2): 40 mg via SUBCUTANEOUS
  Filled 2017-12-09 (×2): qty 0.4

## 2017-12-09 MED ORDER — PANTOPRAZOLE SODIUM 40 MG PO TBEC
40.0000 mg | DELAYED_RELEASE_TABLET | Freq: Every day | ORAL | Status: DC
  Administered 2017-12-09 – 2017-12-11 (×3): 40 mg via ORAL
  Filled 2017-12-09 (×3): qty 1

## 2017-12-09 MED ORDER — SERTRALINE HCL 100 MG PO TABS
100.0000 mg | ORAL_TABLET | Freq: Every day | ORAL | Status: DC
  Administered 2017-12-09 – 2017-12-11 (×3): 100 mg via ORAL
  Filled 2017-12-09 (×3): qty 1

## 2017-12-09 MED ORDER — ASPIRIN 81 MG PO CHEW
81.0000 mg | CHEWABLE_TABLET | Freq: Every day | ORAL | Status: DC
  Administered 2017-12-09 – 2017-12-11 (×3): 81 mg via ORAL
  Filled 2017-12-09 (×3): qty 1

## 2017-12-09 MED ORDER — HYDROCODONE-ACETAMINOPHEN 5-325 MG PO TABS: 1.0000 | ORAL_TABLET | Freq: Three times a day (TID) | ORAL | Status: DC | PRN

## 2017-12-09 MED ORDER — ALBUTEROL (5 MG/ML) CONTINUOUS INHALATION SOLN
10.0000 mg/h | INHALATION_SOLUTION | Freq: Once | RESPIRATORY_TRACT | Status: AC
  Administered 2017-12-09: 10 mg/h via RESPIRATORY_TRACT
  Filled 2017-12-09: qty 20

## 2017-12-09 MED ORDER — BENZONATATE 100 MG PO CAPS
100.0000 mg | ORAL_CAPSULE | Freq: Three times a day (TID) | ORAL | Status: DC | PRN
  Administered 2017-12-10: 100 mg via ORAL
  Filled 2017-12-09: qty 1

## 2017-12-09 MED ORDER — ALBUTEROL SULFATE (2.5 MG/3ML) 0.083% IN NEBU
2.5000 mg | INHALATION_SOLUTION | RESPIRATORY_TRACT | Status: AC
  Administered 2017-12-09 (×2): 2.5 mg via RESPIRATORY_TRACT
  Filled 2017-12-09 (×3): qty 3

## 2017-12-09 MED ORDER — COLESTIPOL HCL 1 G PO TABS
2.0000 g | ORAL_TABLET | Freq: Every day | ORAL | Status: DC
  Administered 2017-12-11: 2 g via ORAL
  Filled 2017-12-09 (×2): qty 2

## 2017-12-09 MED ORDER — COLESTIPOL HCL 1 G PO TABS: 2.0000 g | ORAL_TABLET | ORAL | Status: DC

## 2017-12-09 NOTE — Progress Notes (Signed)
ABG obtained per order. Patient on room air. Results given to nurse.

## 2017-12-09 NOTE — ED Notes (Signed)
Per orders oxygen off, called respiratory to do ABG in 20 min.  Respiratory agreed with plan.

## 2017-12-09 NOTE — ED Triage Notes (Signed)
Patient arrived to ED via [facility] [place] [place]. EMS reports:  Patient diagnosed with viral respiratory infection at hospital on 12/04/17. Has become increasingly short of breath and wheezing in past couple of days.  EMS called. Wheezing and diminished breath sounds. Cough.  EMS administered Solumedrol 125 mg, Albuterol 10 mg and Atrovent 0.5 mg. BP 156/96, Pulse 86, Resp 18, 93% on room air.  22 R wrist.

## 2017-12-09 NOTE — ED Notes (Signed)
Portable CXR at bedside at this time

## 2017-12-09 NOTE — ED Provider Notes (Signed)
[facility] EMERGENCY DEPARTMENT Provider Note   CSN: [id number] Arrival date & time: 12/09/17  4098     History   Chief Complaint Chief Complaint  Patient presents with  . Shortness of Breath    HPI [name] is a 84 y.o. female.  Pt presents to the ED today with sob.  The pt initially presented to the ED on 1/17 with URI sx.  CXR ok and O2 sats ok, so she was d/c home on prednisone and tessalon perles.  The pt has worsened.  EMS said initial O2 sat 93%.  She was given albuterol/atrovent and 125 mg solumedrol and remains very sob.  The pt has not had a fever.        Past Medical History:  Diagnosis Date  . Atrial fibrillation (HCC)   . Cholelithiasis   . Constipation   . Diabetes mellitus   . Dizziness    with some gait disability  . GERD (gastroesophageal reflux disease)   . Hepatic cyst   . Hyperlipidemia   . Hypertension   . Ischemic cardiomyopathy   . LBBB (left bundle branch block)   . Macular degeneration   . Other postprocedural status(V45.89)   . Peripheral edema     Patient Active Problem List   Diagnosis Date Noted  . Reactive airway disease 12/09/2017  . Anxiety and depression 12/09/2017  . Normocytic anemia 04/13/2016  . Acute on chronic combined systolic and diastolic CHF (congestive heart failure) (HCC) 04/13/2016  . Pyelonephritis 04/12/2016  . Chronic combined systolic and diastolic congestive heart failure (HCC) 11/14/2013  . Constipation 11/14/2013  . Nausea 11/14/2013  . Influenza with respiratory manifestations 11/09/2013  . AKI (acute kidney injury) (HCC) 11/09/2013  . Influenza 11/07/2013  . Acute on chronic combined systolic and diastolic congestive heart failure (HCC) 11/07/2013  . Acute respiratory failure with hypoxia (HCC) 11/07/2013  . Fever 11/07/2013  . Generalized weakness 11/07/2013  . Peripheral neuropathy 05/05/2012  . Closed left hip fracture (HCC) 05/04/2012  . Chronic systolic HF (heart  failure) (HCC) 05/04/2012  . Arthritis 07/23/2011  . CHF (congestive heart failure) (HCC) 07/23/2011  . DYSPNEA 07/31/2010  . DEPRESSIVE DISORDER NOT ELSEWHERE CLASSIFIED 02/08/2009  . Type 2 diabetes mellitus with diabetic neuropathy, without long-term current use of insulin (HCC) 11/08/2008  . HYPERLIPIDEMIA-MIXED 11/08/2008  . Essential hypertension 11/08/2008  . LBBB (left bundle branch block) 11/08/2008  . Atrial fibrillation (HCC) 11/08/2008  . GERD 11/08/2008  . HEPATIC CYST 11/08/2008  . CHOLELITHIASIS 11/08/2008  . DILATION AND CURETTAGE, HX OF 11/08/2008    Past Surgical History:  Procedure Laterality Date  . ANKLE SURGERY Bilateral    fractures  . CHOLECYSTECTOMY    . Dilation and curettage    . HIP ARTHROPLASTY  05/05/2012   Procedure: ARTHROPLASTY BIPOLAR HIP;  Surgeon: Shelda Pal, MD;  Location: [facility];  Service: Orthopedics;  Laterality: Left;  . TONSILLECTOMY    . WRIST SURGERY Bilateral    wrist fractures    OB History    No data available       Home Medications    Prior to Admission medications   Medication Sig Start Date End Date Taking? Authorizing Provider  aspirin 81 MG chewable tablet Chew 81 mg by mouth daily.    Yes [provider]  benzonatate (TESSALON) 100 MG capsule Take 1 capsule (100 mg total) by mouth 3 (three) times daily as needed for cough. 12/04/17  Yes Pollina, Canary Brim, MD  carvedilol (COREG) 6.25 MG tablet Take 6.25 mg by mouth 2 (two) times daily. 09/03/17  Yes [provider]  colestipol (COLESTID) 1 g tablet Take 2 tablets by mouth as directed. FOR CONSTIPATION 11/26/17  Yes [provider]  diazepam (VALIUM) 5 MG tablet Take 5 mg by mouth See admin instructions. Take 1 tablet (5 mg) by mouth daily at bedtime, may also take 1 tablet (5 mg) every morning as needed for anxiety   Yes [provider]  furosemide (LASIX) 40 MG tablet Take 40 mg by mouth daily.   Yes [provider]    guaifenesin (ROBAFEN) 100 MG/5ML syrup Take 100 mg by mouth every 8 (eight) hours as needed for cough.   Yes [provider]  lactobacillus acidophilus & bulgar (LACTINEX) chewable tablet Chew 1 tablet by mouth daily.   Yes [provider]  loperamide (IMODIUM A-D) 2 MG tablet Take 2-4 mg by mouth See admin instructions. Take 2 tablets (4 mg) by mouth as needed after 1st loose stool followed by 1 tablet (2 mg) after each subsequent loose stool.   Yes [provider]  magnesium hydroxide (MILK OF MAGNESIA) 400 MG/5ML suspension Take 15 mLs by mouth daily as needed (constipation).    Yes [provider]  mirtazapine (REMERON) 30 MG tablet Take 30 mg by mouth at bedtime. 09/03/17  Yes [provider]  Multiple Vitamins-Minerals (PRESERVISION AREDS PO) Take 1 tablet by mouth daily.    Yes [provider]  omeprazole (PRILOSEC) 20 MG capsule Take 20 mg by mouth daily.   Yes [provider]  ondansetron (ZOFRAN) 4 MG tablet Take 4 mg by mouth daily as needed for nausea.  06/09/17  Yes [provider]  predniSONE (DELTASONE) 20 MG tablet Take 2 tablets (40 mg total) by mouth daily with breakfast. 12/04/17  Yes Pollina, Canary Brimhristopher J, MD  sertraline (ZOLOFT) 100 MG tablet Take 100 mg by mouth daily. 08/25/16  Yes [provider]  simvastatin (ZOCOR) 40 MG tablet Take 40 mg by mouth daily.   Yes [provider]  spironolactone (ALDACTONE) 25 MG tablet Take 0.5 tablets (12.5 mg total) by mouth daily. 02/21/17  Yes Wendall StadeNishan, Peter C, MD  acetaminophen (TYLENOL) 325 MG tablet Take 325 mg by mouth every 6 (six) hours as needed (pain).    [provider]  HYDROcodone-acetaminophen (NORCO/VICODIN) 5-325 MG tablet Take 1 tablet by mouth 3 (three) times daily as needed (pain).  03/14/16   [provider]  potassium chloride (K-DUR) 10 MEQ tablet Take 2 tablets (20 mEq total) by mouth 2 (two) times daily. 09/08/17  09/11/17  Alvira MondaySchlossman, Erin, MD  potassium chloride (K-DUR,KLOR-CON) 10 MEQ tablet Take 20 mEq by mouth daily.    [provider]    Family History Family History  Problem Relation Age of Onset  . Leukemia Mother   . Arthritis Father   . Heart failure Brother        died age 82  . Arthritis Brother     Social History Social History   Tobacco Use  . Smoking status: Passive Smoke Exposure - Never Smoker  . Smokeless tobacco: Never Used  . 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 Alcohol use: No  . Drug use: No     Allergies   Fosamax [alendronate sodium]; Penicillins; Sulfonamide derivatives; and Tetanus toxoid   Review of Systems Review of Systems  Respiratory: Positive for cough, shortness of  breath and wheezing.   All other systems reviewed and are negative.    Physical Exam Updated Vital Signs BP 136/73   Pulse 77   Temp (!) 97.4 F (36.3 C) (Axillary)   Resp 17   Ht 5\' 10"  (1.778 m)   Wt 92.5 kg (204 lb)   SpO2 97%   BMI 29.27 kg/m   Physical Exam  Constitutional: She is oriented to person, place, and time. She appears ill. She appears distressed.  HENT:  Head: Normocephalic and atraumatic.  Mouth/Throat: Oropharynx is clear and moist.  Eyes: EOM are normal. Pupils are equal, round, and reactive to light.  Neck: Normal range of motion. Neck supple.  Cardiovascular: Normal rate, regular rhythm, normal heart sounds and intact distal pulses.  Pulmonary/Chest: Tachypnea noted. She has wheezes.  Abdominal: Soft. Bowel sounds are normal.  Musculoskeletal: Normal range of motion.       Right lower leg: Normal.       Left lower leg: Normal.  Neurological: She is alert and oriented to person, place, and time.  Skin: Skin is warm and dry. Capillary refill takes less than 2 seconds.  Psychiatric: She has a normal mood and affect.  Nursing note and vitals reviewed.    ED Treatments / Results    Labs (all labs ordered are listed, but only abnormal results are displayed) Labs Reviewed  COMPREHENSIVE METABOLIC PANEL - Abnormal; Notable for the following components:      Result Value   Glucose, Bld 130 (*)    Creatinine, Ser 1.09 (*)    Calcium 8.8 (*)    Albumin 3.2 (*)    GFR calc non Af Amer 43 (*)    GFR calc Af Amer 49 (*)    All other components within normal limits  CBC WITH DIFFERENTIAL/PLATELET - Abnormal; Notable for the following components:   Platelets 130 (*)    Monocytes Absolute 1.2 (*)    All other components within normal limits  CULTURE, BLOOD (ROUTINE X 2)  CULTURE, BLOOD (ROUTINE X 2)  TROPONIN I  BRAIN NATRIURETIC PEPTIDE  INFLUENZA PANEL BY PCR (TYPE A & B)  I-STAT CG4 LACTIC ACID, ED  I-STAT CG4 LACTIC ACID, ED    EKG  EKG Interpretation  Date/Time:  Tuesday December 09 2017 08:28:53 EST Ventricular Rate:  82 PR Interval:    QRS Duration: 149 QT Interval:  443 QTC Calculation: 518 R Axis:   -34 Text Interpretation:  Sinus rhythm Prolonged PR interval Left bundle branch block No significant change since last tracing Confirmed by Jacalyn Lefevre [phone]) on 12/09/2017 8:34:05 AM       Radiology Ct Chest W Contrast  Result Date: 12/09/2017 CLINICAL DATA:  Diagnosed with a viral respiratory infection on 12/04/2017, progressive shortness of breath and wheezing over past couple days, history hypertension, type II diabetes mellitus, atrial fibrillation, ischemic cardiomyopathy, GERD, acute on chronic combined CHF EXAM: CT CHEST WITH CONTRAST TECHNIQUE: Multidetector CT imaging of the chest was performed during intravenous contrast administration. Sagittal and coronal MPR images reconstructed from axial data set. CONTRAST:  60 mL ISOVUE-300 IOPAMIDOL (ISOVUE-300) INJECTION 61% IV COMPARISON:  03/17/2009 CT chest, CT abdomen and pelvis 11/25/2008 FINDINGS: Cardiovascular: Atherosclerotic calcifications aorta, coronary arteries and proximal great vessels.  Aorta normal caliber without aneurysm or dissection. Small amount of pericardial fluid. Streak artifacts traverse scattered pulmonary arteries especially at the lower lungs. Pulmonary arteries otherwise grossly unremarkable. Mediastinum/Nodes: Few scattered normal mediastinal lymph nodes without thoracic adenopathy. Esophagus unremarkable for technique. Base  of cervical region normal appearance. Lungs/Pleura: Mild scattered atelectasis predominantly towards lung bases. No acute infiltrate or pneumothorax. Small nodular focus in LEFT upper lobe 5 mm diameter image 95 grossly unchanged. Calcified granuloma RIGHT upper lobe image 58. Tiny RIGHT pleural effusion. Calcified granulomata within the posterior lower lobes neck os phrenic angles. Upper Abdomen: Small cyst lateral to the upper pole RIGHT kidney 2.8 x 2.2 cm image 171 present since at least 11/25/2008. Small cysts within liver. Remaining visualized upper abdomen unremarkable. Musculoskeletal: Osseous demineralization.  No acute bony findings. IMPRESSION: Significant atherosclerotic disease including coronary arteries. Old granulomatous disease of the lungs with a noncalcified LEFT upper lobe nodule 5 mm diameter unchanged since 2010. Minimal scattered atelectasis and tiny RIGHT pleural effusion. Aortic Atherosclerosis (ICD10-I70.0). Electronically Signed   By: Ulyses Southward M.D.   On: 12/09/2017 12:54   Dg Chest Port 1 View  Result Date: 12/09/2017 CLINICAL DATA:  Shortness of breath, fever, cough, congestion EXAM: PORTABLE CHEST 1 VIEW COMPARISON:  12/04/2017 FINDINGS: Heart is normal size. No confluent airspace opacities. Mild peribronchial thickening and increased interstitial prominence in the lung bases. These findings may reflect bronchitis. No effusions or acute bony abnormality. IMPRESSION: Bronchitic changes. Electronically Signed   By: Charlett Nose M.D.   On: 12/09/2017 08:58    Procedures Procedures (including critical care time)  Medications  Ordered in ED Medications  albuterol (PROVENTIL,VENTOLIN) solution continuous neb (not administered)  albuterol (PROVENTIL,VENTOLIN) solution continuous neb (10 mg/hr Nebulization Given 12/09/17 0911)  iopamidol (ISOVUE-300) 61 % injection (60 mLs  Contrast Given 12/09/17 1210)     Initial Impression / Assessment and Plan / ED Course  I have reviewed the triage vital signs and the nursing notes.  Pertinent labs & imaging results that were available during my care of the patient were reviewed by me and considered in my medical decision making (see chart for details).     O2 sats drop to 88% RA with movement.  Pt is still wheezy and sob after nebs and solumedrol.  Pt d/w Dr. Luberta Robertson (triad) who requests a CT scan prior to admission.  CT ok, so she will see her.  Pt is still wheezy and tachypneic.  Additional neb ordered.  Final Clinical Impressions(s) / ED Diagnoses   Final diagnoses:  Mild intermittent reactive airway disease with acute exacerbation  Hypoxia    ED Discharge Orders    None       Jacalyn Lefevre, MD 12/09/17 1326

## 2017-12-09 NOTE — H&P (Addendum)
History and Physical:    [name]   ONG:[id number] DOB: 08-30-1923 DOA: 12/09/2017  Referring MD/provider: Dr Particia Nearing  PCP: [name], MD   Patient coming from: Home  Chief Complaint: Cough and shortness of breath  History of Present Illness:   [name] is an 84 y.o. female who states her disease course started 3 months ago when she had onset of cough. Patient was treated with cough suppressants at the assisted living facility with moderately good effect. However the cough persisted so patient was seen in the ER last week and was treated with prednisone and Tessalon Perles. Patient states it did not seem to help her much and that over the past 2 days her cough has persisted such that she is coughing all the time. Patient states that her biggest problem is that her phlegm is very thick and that she has to cough very hard to get the phlegm out and she feels that if she could get the phlegm out she wouldn't have to cough.  Patient denies any fevers or chills. Denies any malaise or myalgias. Denies any chest pain, pleuritic chest pain. Patient notes that she's been wheezing for the past 5 days and that she has never wheezed before. Patient notes that the wheezing resolves when she is able to cough up phlegm.  Patient's main concern appears to be insurance related. She wants to assure [place] that she has long-term disability as well as Medicare but she still concerned about how much this is a cost. She seems to perseverate on this somewhat. Patient admits to feeling anxious a lot of the time. She notes that she had another episode where she was very short of breath and was told this was from anxiety, but does not remember when that was.  ED Course:  The patient was noted to be wheezing, she was treated with Solu-Medrol in the ambulance and continuous nebulizers with minimal effect. Of note her O2 sats were 93% on room air when she was quite short of breath. CT scan was  negative for pneumonia. She is admitted for ongoing treatment of wheezing and shortness of breath.  ROS:   ROS   Review of Systems: General: No fever, chills, weight changes Skin: No rashes, lesions, wounds HENT: no ear pain, hearing loss, drainage, tinnitus Endocrine: no heat/cold intolerance, no polyuria Respiratory: Positive cough and shortness of breath as noted above, no hemoptysis Cardiovascular: No palpitations, chest pain GI: No nausea, vomiting, diarrhea, constipation GU: No dysuria, increased frequency CNS: No numbness, dizziness, headache Musculoskeletal: No back pain, joint pain   Past Medical History:   Past Medical History:  Diagnosis Date  . Atrial fibrillation (HCC)   . Cholelithiasis   . Constipation   . Diabetes mellitus   . Dizziness    with some gait disability  . GERD (gastroesophageal reflux disease)   . Hepatic cyst   . Hyperlipidemia   . Hypertension   . Ischemic cardiomyopathy   . LBBB (left bundle branch block)   . Macular degeneration   . Other postprocedural status(V45.89)   . Peripheral edema     Past Surgical History:   Past Surgical History:  Procedure Laterality Date  . ANKLE SURGERY Bilateral    fractures  . CHOLECYSTECTOMY    . Dilation and curettage    . HIP ARTHROPLASTY  05/05/2012   Procedure: ARTHROPLASTY BIPOLAR HIP;  Surgeon: Shelda Pal, MD;  Location: [facility];  Service: Orthopedics;  Laterality: Left;  .  TONSILLECTOMY    . WRIST SURGERY Bilateral    wrist fractures    Social History:   Social History   Socioeconomic History  . Marital status: Widowed    Spouse name: Not on file  . Number of children: Not on file  . Years of education: Not on file  . Highest education level: Not on file  Social Needs  . Financial resource strain: Not on file  . Food insecurity - worry: Not on file  . Food insecurity - inability: Not on file  . Transportation needs - medical: Not on file  . Transportation needs -  non-medical: Not on file  Occupational History  . Not on file  Tobacco Use  . Smoking status: Passive Smoke Exposure - Never Smoker  . Smokeless tobacco: Never Used  . 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 Alcohol use: No  . Drug use: No  . Sexual activity: Not on file  Other Topics Concern  . Not on file  Social History Narrative   Lives alone, uses a walker.  Does not drive              Allergies   Fosamax [alendronate sodium]; Penicillins; Sulfonamide derivatives; and Tetanus toxoid  Family history:   Family History  Problem Relation Age of Onset  . Leukemia Mother   . Arthritis Father   . Heart failure Brother        died age 2  . Arthritis Brother     Current Medications:   Prior to Admission medications   Medication Sig Start Date End Date Taking? Authorizing Provider  aspirin 81 MG chewable tablet Chew 81 mg by mouth daily.    Yes [provider]  benzonatate (TESSALON) 100 MG capsule Take 1 capsule (100 mg total) by mouth 3 (three) times daily as needed for cough. 12/04/17  Yes Pollina, Canary Brim, MD  carvedilol (COREG) 6.25 MG tablet Take 6.25 mg by mouth 2 (two) times daily. 09/03/17  Yes [provider]  colestipol (COLESTID) 1 g tablet Take 2 tablets by mouth as directed. FOR CONSTIPATION 11/26/17  Yes [provider]  diazepam (VALIUM) 5 MG tablet Take 5 mg by mouth See admin instructions. Take 1 tablet (5 mg) by mouth daily at bedtime, may also take 1 tablet (5 mg) every morning as needed for anxiety   Yes [provider]  furosemide (LASIX) 40 MG tablet Take 40 mg by mouth daily.   Yes [provider]  guaifenesin (ROBAFEN) 100 MG/5ML syrup Take 100 mg by mouth every 8 (eight) hours as needed for cough.   Yes [provider]  lactobacillus acidophilus & bulgar (LACTINEX) chewable tablet Chew 1 tablet by mouth daily.   Yes [provider]  loperamide (IMODIUM A-D) 2 MG tablet Take 2-4 mg by mouth See admin instructions. Take 2 tablets (4 mg) by mouth as needed after 1st loose stool followed by 1 tablet (2 mg) after each subsequent loose stool.   Yes [provider]  magnesium hydroxide (MILK OF MAGNESIA) 400 MG/5ML suspension Take 15 mLs by mouth daily as needed (constipation).    Yes [provider]  mirtazapine (REMERON) 30 MG tablet Take 30 mg by mouth at bedtime. 09/03/17  Yes [provider]  Multiple Vitamins-Minerals (PRESERVISION AREDS PO) Take 1 tablet by mouth daily.    Yes [provider]  omeprazole (PRILOSEC) 20 MG capsule Take 20 mg by  mouth daily.   Yes [provider]  ondansetron (ZOFRAN) 4 MG tablet Take 4 mg by mouth daily as needed for nausea.  06/09/17  Yes [provider]  predniSONE (DELTASONE) 20 MG tablet Take 2 tablets (40 mg total) by mouth daily with breakfast. 12/04/17  Yes Pollina, Canary Brimhristopher J, MD  sertraline (ZOLOFT) 100 MG tablet Take 100 mg by mouth daily. 08/25/16  Yes [provider]  simvastatin (ZOCOR) 40 MG tablet Take 40 mg by mouth daily.   Yes [provider]  spironolactone (ALDACTONE) 25 MG tablet Take 0.5 tablets (12.5 mg total) by mouth daily. 02/21/17  Yes Wendall StadeNishan, Peter C, MD  acetaminophen (TYLENOL) 325 MG tablet Take 325 mg by mouth every 6 (six) hours as needed (pain).    [provider]  HYDROcodone-acetaminophen (NORCO/VICODIN) 5-325 MG tablet Take 1 tablet by mouth 3 (three) times daily as needed (pain).  03/14/16   [provider]  potassium chloride (K-DUR) 10 MEQ tablet Take 2 tablets (20 mEq total) by mouth 2 (two) times daily. 09/08/17 09/11/17  Alvira MondaySchlossman, Erin, MD  potassium chloride (K-DUR,KLOR-CON) 10 MEQ tablet Take 20 mEq by mouth daily.    [provider]    Physical Exam:   Vitals:   12/09/17 1240 12/09/17 1241 12/09/17 1245 12/09/17 1328  BP:   136/73     Pulse: 79 79 77   Resp: (!) 22 15 17    Temp:      TempSrc:      SpO2: 94% 95% 97% 97%  Weight:      Height:         Physical Exam: Blood pressure 136/73, pulse 77, temperature (!) 97.4 F (36.3 C), temperature source Axillary, resp. rate 17, height 5\' 10"  (1.778 m), weight 92.5 kg (204 lb), SpO2 97 %. Gen: Patient appearing younger than stated age sitting up in bed with continuous nebulizer on face with audible wheezing, speaking at length but pausing to take breaths with O2 sats in the high 90s. Eyes: Sclerae anicteric. Conjunctiva mildly injected. Chest: Moderate air entry bilaterally with diffuse inspiratory and expiratory wheezes in all lung fields with prolonged I:E at 1:3.  CV: Distant, regular, no audible murmurs. Abdomen: NABS, soft, nondistended, nontender. No tenderness to light or deep palpation. No rebound, no guarding. Extremities: 1+ edema.  Neuro: Alert and oriented times 3; grossly nonfocal. Psych: Patient is anxious, perseverates on her insurance, appears to be less short of breath when not so anxious..  Data Review:    Labs: Basic Metabolic Panel: Recent Labs  Lab 12/04/17 0225 12/09/17 0837  NA 141 141  K 3.7 3.6  CL 103 103  CO2 26 24  GLUCOSE 129* 130*  BUN 16 19  CREATININE 1.23* 1.09*  CALCIUM 8.9 8.8*   Liver Function Tests: Recent Labs  Lab 12/09/17 0837  AST 29  ALT 20  ALKPHOS 55  BILITOT 0.8  PROT 6.5  ALBUMIN 3.2*   No results for input(s): LIPASE, AMYLASE in the last 168 hours. No results for input(s): AMMONIA in the last 168 hours. CBC: Recent Labs  Lab 12/04/17 0225 12/09/17 0837  WBC 6.8 7.5  NEUTROABS 4.6 3.8  HGB 13.4 14.0  HCT 40.9 41.0  MCV 91.9 90.9  PLT 137* 130*   Cardiac Enzymes: Recent Labs  Lab 12/09/17 0837  TROPONINI <0.03    BNP (last 3 results) No results for input(s): PROBNP in the last 8760 hours. CBG: No results for input(s): GLUCAP in the last 168  hours.  Urinalysis    Component Value  Date/Time   COLORURINE AMBER (A) 04/12/2016 2243   APPEARANCEUR CLOUDY (A) 04/12/2016 2243   LABSPEC 1.024 04/12/2016 2243   PHURINE 5.5 04/12/2016 2243   GLUCOSEU NEGATIVE 04/12/2016 2243   HGBUR SMALL (A) 04/12/2016 2243   BILIRUBINUR SMALL (A) 04/12/2016 2243   KETONESUR 15 (A) 04/12/2016 2243   PROTEINUR NEGATIVE 04/12/2016 2243   UROBILINOGEN 0.2 06/21/2014 1924   NITRITE NEGATIVE 04/12/2016 2243   LEUKOCYTESUR LARGE (A) 04/12/2016 2243      Radiographic Studies: Ct Chest W Contrast  Result Date: 12/09/2017 CLINICAL DATA:  Diagnosed with a viral respiratory infection on 12/04/2017, progressive shortness of breath and wheezing over past couple days, history hypertension, type II diabetes mellitus, atrial fibrillation, ischemic cardiomyopathy, GERD, acute on chronic combined CHF EXAM: CT CHEST WITH CONTRAST TECHNIQUE: Multidetector CT imaging of the chest was performed during intravenous contrast administration. Sagittal and coronal MPR images reconstructed from axial data set. CONTRAST:  60 mL ISOVUE-300 IOPAMIDOL (ISOVUE-300) INJECTION 61% IV COMPARISON:  03/17/2009 CT chest, CT abdomen and pelvis 11/25/2008 FINDINGS: Cardiovascular: Atherosclerotic calcifications aorta, coronary arteries and proximal great vessels. Aorta normal caliber without aneurysm or dissection. Small amount of pericardial fluid. Streak artifacts traverse scattered pulmonary arteries especially at the lower lungs. Pulmonary arteries otherwise grossly unremarkable. Mediastinum/Nodes: Few scattered normal mediastinal lymph nodes without thoracic adenopathy. Esophagus unremarkable for technique. Base of cervical region normal appearance. Lungs/Pleura: Mild scattered atelectasis predominantly towards lung bases. No acute infiltrate or pneumothorax. Small nodular focus in LEFT upper lobe 5 mm diameter image 95 grossly unchanged. Calcified granuloma RIGHT upper lobe image 58. Tiny RIGHT pleural effusion. Calcified  granulomata within the posterior lower lobes neck os phrenic angles. Upper Abdomen: Small cyst lateral to the upper pole RIGHT kidney 2.8 x 2.2 cm image 171 present since at least 11/25/2008. Small cysts within liver. Remaining visualized upper abdomen unremarkable. Musculoskeletal: Osseous demineralization.  No acute bony findings. IMPRESSION: Significant atherosclerotic disease including coronary arteries. Old granulomatous disease of the lungs with a noncalcified LEFT upper lobe nodule 5 mm diameter unchanged since 2010. Minimal scattered atelectasis and tiny RIGHT pleural effusion. Aortic Atherosclerosis (ICD10-I70.0). Electronically Signed   By: Ulyses Southward M.D.   On: 12/09/2017 12:54   Dg Chest Port 1 View  Result Date: 12/09/2017 CLINICAL DATA:  Shortness of breath, fever, cough, congestion EXAM: PORTABLE CHEST 1 VIEW COMPARISON:  12/04/2017 FINDINGS: Heart is normal size. No confluent airspace opacities. Mild peribronchial thickening and increased interstitial prominence in the lung bases. These findings may reflect bronchitis. No effusions or acute bony abnormality. IMPRESSION: Bronchitic changes. Electronically Signed   By: Charlett Nose M.D.   On: 12/09/2017 08:58    EKG: Independently reviewed. NSR at 80, first degree heart block, LBBB   Assessment/Plan:   Active Problems:   Type 2 diabetes mellitus with diabetic neuropathy, without long-term current use of insulin (HCC)   Essential hypertension   Atrial fibrillation (HCC)   GERD   Reactive airway disease   Anxiety and depression  REACTIVE AIRWAY DISEASE Patient with diffuse wheezes bilaterally with prolonged I:E. Denies any personal tobacco use but admits to her husband having 2 packs a day tobacco history. She has responded to continuous nebulizers and steroids. Will continue albuterol every 2 for the rest of the evening. Continue steroids and add doxycycline. Flu swab is negative. ABG done when patient was short of breath is  very reassuring with normal pH with elevated bicarbonate despite tachypnea suggestive  of chronic respiratory acidosis. Patient may benefit from outpatient PFTs if warranted.  PAROXYSMAL ATRIAL FIBRILLATION Maintain NSR, on carvedilol for rate control No anticoagulation due to falls.   DM2 Diet control SSI ordered   HTN Continue carvedilol and spironolactone  GERD Continue Prilosec  ANXIETY AND DEPRESSION Continue mirtazapine and sertraline  CHF Patient was previously diagnosed with congestive heart failure and was put on Lasix which resulted in renal failure as an outpatient. Patient is followed closely by cardiology and last echo was noted to have EF 60% on 04/14/16  Patient is DO NOT RESUSCITATE   Other information:   DVT prophylaxis: Lovenox ordered. Code Status:Patient is DO NOT RESUSCITATE  Family Communication: Patient comes from assisted living facility, states there is no one further to call  Disposition Plan: Assisted living facility  Consults called: None  Admission status: Inpatient   The medical decision making on this patient was of high complexity and the patient is at high risk for clinical deterioration, therefore this is a level 3 visit.   Horatio Pel Orma Flaming [facility] Pager [phone] Cell: [phone]   If 7PM-7AM, please contact night-coverage [email or web address] Password [facility] 12/09/2017, 1:57 PM

## 2017-12-09 NOTE — ED Notes (Signed)
Pt called nurse to room.  Pt just spoke with admitting doctor and is confused about something doctor asked her.  Pt was asked if she would agree to ETT.  Pt initially said yes, now pt is tearful, worried about LTC insurance paying for her place [facility] assisted [facility] if she is in the hospital or is unable to go back to [facility].

## 2017-12-10 DIAGNOSIS — J9601 Acute respiratory failure with hypoxia: Secondary | ICD-10-CM

## 2017-12-10 DIAGNOSIS — I10 Essential (primary) hypertension: Secondary | ICD-10-CM

## 2017-12-10 DIAGNOSIS — E1140 Type 2 diabetes mellitus with diabetic neuropathy, unspecified: Secondary | ICD-10-CM

## 2017-12-10 DIAGNOSIS — I482 Chronic atrial fibrillation: Secondary | ICD-10-CM

## 2017-12-10 DIAGNOSIS — J4521 Mild intermittent asthma with (acute) exacerbation: Secondary | ICD-10-CM

## 2017-12-10 DIAGNOSIS — F419 Anxiety disorder, unspecified: Secondary | ICD-10-CM

## 2017-12-10 DIAGNOSIS — F329 Major depressive disorder, single episode, unspecified: Secondary | ICD-10-CM

## 2017-12-10 DIAGNOSIS — R0902 Hypoxemia: Secondary | ICD-10-CM

## 2017-12-10 DIAGNOSIS — J4551 Severe persistent asthma with (acute) exacerbation: Secondary | ICD-10-CM

## 2017-12-10 DIAGNOSIS — I480 Paroxysmal atrial fibrillation: Secondary | ICD-10-CM

## 2017-12-10 DIAGNOSIS — K219 Gastro-esophageal reflux disease without esophagitis: Secondary | ICD-10-CM

## 2017-12-10 LAB — BASIC METABOLIC PANEL
Anion gap: 14 (ref 5–15)
BUN: 25 mg/dL — ABNORMAL HIGH (ref 6–20)
CHLORIDE: 105 mmol/L (ref 101–111)
CO2: 20 mmol/L — ABNORMAL LOW (ref 22–32)
Calcium: 9 mg/dL (ref 8.9–10.3)
Creatinine, Ser: 1.08 mg/dL — ABNORMAL HIGH (ref 0.44–1.00)
GFR calc non Af Amer: 43 mL/min — ABNORMAL LOW (ref >60)
GFR, EST AFRICAN AMERICAN: 50 mL/min — AB (ref >60)
Glucose, Bld: 166 mg/dL — ABNORMAL HIGH (ref 65–99)
POTASSIUM: 4 mmol/L (ref 3.5–5.1)
SODIUM: 139 mmol/L (ref 135–145)

## 2017-12-10 LAB — GLUCOSE, CAPILLARY
GLUCOSE-CAPILLARY: 100 mg/dL — AB (ref 65–99)
GLUCOSE-CAPILLARY: 165 mg/dL — AB (ref 65–99)
GLUCOSE-CAPILLARY: 175 mg/dL — AB (ref 65–99)
Glucose-Capillary: 179 mg/dL — ABNORMAL HIGH (ref 65–99)

## 2017-12-10 LAB — CBC
HEMATOCRIT: 38.2 % (ref 36.0–46.0)
Hemoglobin: 12.8 g/dL (ref 12.0–15.0)
MCH: 30.5 pg (ref 26.0–34.0)
MCHC: 33.5 g/dL (ref 30.0–36.0)
MCV: 91 fL (ref 78.0–100.0)
Platelets: 135 10*3/uL — ABNORMAL LOW (ref 150–400)
RBC: 4.2 MIL/uL (ref 3.87–5.11)
RDW: 13.5 % (ref 11.5–15.5)
WBC: 7.4 10*3/uL (ref 4.0–10.5)

## 2017-12-10 NOTE — Progress Notes (Signed)
PROGRESS NOTE  [name] AOZ:[id number] DOB: 13-Aug-1923 DOA: 12/09/2017 PCP: Lupita RaiderShaw, Kimberlee, MD  HPI/Recap of past 24 hours: Kristen ArtisKatherine C Jimenez is an 84 y.o. female from assisted living who presented with persistent cough and wheezing of 3 months duration. Admitted for reactive airway disease.  12/10/17: patient seen and examined with no family at her bedside. She reports persistent wheezes and cough. Denies chest pain.    Assessment/Plan: Principal Problem:   Reactive airway disease Active Problems:   Type 2 diabetes mellitus with diabetic neuropathy, without long-term current use of insulin (HCC)   Essential hypertension   Atrial fibrillation (HCC)   GERD   Anxiety and depression   Acute respiratory failure (HCC)  Reactive airway disease -persistent wheezes -continue nebulizers and steroids -continue doxycycline -Flu swab is negative -DC IV fluid 12/10/17  Paroxysmal A-fib -rate controlled -continue carvedilol -no anticoagulation due to recent multiple falls -continue telemetry monitorin  DM2 -Diet controlled -continue SSI  -A1C, BMP am  HTN -stable -Continue carvedilol and spironolactone  GERD -Stable -Continue Prilosec  Anxiety/depression Stable -Continue mirtazapine and sertraline  Chronic dCHF -last echo was noted to have EF 60% on 04/14/16 -not in acute exacerbation -continue home meds  Ambulatory dysfunction -PT evaluate and treat -fall precaution  Code Status: DNR   Family Communication: No family members at bedside.  Disposition Plan: will stay another midnight to continue current treatment.   Severity: Moderate to severe due to persistent symptomatology. Will need to continue round the clock breathing treatments, antibiotics and steroids.   Consultants:  none  Procedures:  none  Antimicrobials:  doxycycline  DVT prophylaxis:  SCds; sq lovenox   Objective: Vitals:   12/09/17 2019 12/09/17 2348  12/10/17 0321 12/10/17 0454  BP:  135/78 (!) 141/71   Pulse:  60 67   Resp:  (!) 22 (!) 25   Temp:  (!) 97.4 F (36.3 C) (!) 97.5 F (36.4 C)   TempSrc:  Oral Oral   SpO2: 94% 92% 92%   Weight:    90.9 kg (200 lb 6.4 oz)  Height:        Intake/Output Summary (Last 24 hours) at 12/10/2017 0728 Last data filed at 12/10/2017 0600 Gross per 24 hour  Intake 1781.67 ml  Output -  Net 1781.67 ml   Filed Weights   12/09/17 0834 12/10/17 0454  Weight: 92.5 kg (204 lb) 90.9 kg (200 lb 6.4 oz)    Exam:   General:  84 yo CF WD WN NAD A&O x3   Cardiovascular: RRR no rubs or gallops  Respiratory: diffused wheezes; no rales  Abdomen: soft NT ND NBS x4  Musculoskeletal: non focal trace LE edema x2   Skin: no rash noted  Psychiatry: mood appropriate for condition and setting   Data Reviewed: CBC: Recent Labs  Lab 12/04/17 0225 12/09/17 0837 12/10/17 0349  WBC 6.8 7.5 7.4  NEUTROABS 4.6 3.8  --   HGB 13.4 14.0 12.8  HCT 40.9 41.0 38.2  MCV 91.9 90.9 91.0  PLT 137* 130* 135*   Basic Metabolic Panel: Recent Labs  Lab 12/04/17 0225 12/09/17 0837 12/10/17 0349  NA 141 141 139  K 3.7 3.6 4.0  CL 103 103 105  CO2 26 24 20*  GLUCOSE 129* 130* 166*  BUN 16 19 25*  CREATININE 1.23* 1.09* 1.08*  CALCIUM 8.9 8.8* 9.0   GFR: Estimated Creatinine Clearance: 40.7 mL/min (A) (by C-G formula based on SCr of 1.08 mg/dL (H)). Liver Function Tests: Recent  Labs  Lab 12/09/17 0837  AST 29  ALT 20  ALKPHOS 55  BILITOT 0.8  PROT 6.5  ALBUMIN 3.2*   No results for input(s): LIPASE, AMYLASE in the last 168 hours. No results for input(s): AMMONIA in the last 168 hours. Coagulation Profile: No results for input(s): INR, PROTIME in the last 168 hours. Cardiac Enzymes: Recent Labs  Lab 12/09/17 0837  TROPONINI <0.03   BNP (last 3 results) No results for input(s): PROBNP in the last 8760 hours. HbA1C: No results for input(s): HGBA1C in the last 72 hours. CBG: Recent  Labs  Lab 12/09/17 1731 12/09/17 2052  GLUCAP 268* 245*   Lipid Profile: No results for input(s): CHOL, HDL, LDLCALC, TRIG, CHOLHDL, LDLDIRECT in the last 72 hours. Thyroid Function Tests: No results for input(s): TSH, T4TOTAL, FREET4, T3FREE, THYROIDAB in the last 72 hours. Anemia Panel: No results for input(s): VITAMINB12, FOLATE, FERRITIN, TIBC, IRON, RETICCTPCT in the last 72 hours. Urine analysis:    Component Value Date/Time   COLORURINE AMBER (A) 04/12/2016 2243   APPEARANCEUR CLOUDY (A) 04/12/2016 2243   LABSPEC 1.024 04/12/2016 2243   PHURINE 5.5 04/12/2016 2243   GLUCOSEU NEGATIVE 04/12/2016 2243   HGBUR SMALL (A) 04/12/2016 2243   BILIRUBINUR SMALL (A) 04/12/2016 2243   KETONESUR 15 (A) 04/12/2016 2243   PROTEINUR NEGATIVE 04/12/2016 2243   UROBILINOGEN 0.2 06/21/2014 1924   NITRITE NEGATIVE 04/12/2016 2243   LEUKOCYTESUR LARGE (A) 04/12/2016 2243   Sepsis Labs: @LABRCNTIP (procalcitonin:4,lacticidven:4)  ) Recent Results (from the past 240 hour(s))  MRSA PCR Screening     Status: None   Collection Time: 12/09/17  4:32 PM  Result Value Ref Range Status   MRSA by PCR NEGATIVE NEGATIVE Final    Comment:        The GeneXpert MRSA Assay (FDA approved for NASAL specimens only), is one component of a comprehensive MRSA colonization surveillance program. It is not intended to diagnose MRSA infection nor to guide or monitor treatment for MRSA infections.       Studies: Ct Chest W Contrast  Result Date: 12/09/2017 CLINICAL DATA:  Diagnosed with a viral respiratory infection on 12/04/2017, progressive shortness of breath and wheezing over past couple days, history hypertension, type II diabetes mellitus, atrial fibrillation, ischemic cardiomyopathy, GERD, acute on chronic combined CHF EXAM: CT CHEST WITH CONTRAST TECHNIQUE: Multidetector CT imaging of the chest was performed during intravenous contrast administration. Sagittal and coronal MPR images reconstructed  from axial data set. CONTRAST:  60 mL ISOVUE-300 IOPAMIDOL (ISOVUE-300) INJECTION 61% IV COMPARISON:  03/17/2009 CT chest, CT abdomen and pelvis 11/25/2008 FINDINGS: Cardiovascular: Atherosclerotic calcifications aorta, coronary arteries and proximal great vessels. Aorta normal caliber without aneurysm or dissection. Small amount of pericardial fluid. Streak artifacts traverse scattered pulmonary arteries especially at the lower lungs. Pulmonary arteries otherwise grossly unremarkable. Mediastinum/Nodes: Few scattered normal mediastinal lymph nodes without thoracic adenopathy. Esophagus unremarkable for technique. Base of cervical region normal appearance. Lungs/Pleura: Mild scattered atelectasis predominantly towards lung bases. No acute infiltrate or pneumothorax. Small nodular focus in LEFT upper lobe 5 mm diameter image 95 grossly unchanged. Calcified granuloma RIGHT upper lobe image 58. Tiny RIGHT pleural effusion. Calcified granulomata within the posterior lower lobes neck os phrenic angles. Upper Abdomen: Small cyst lateral to the upper pole RIGHT kidney 2.8 x 2.2 cm image 171 present since at least 11/25/2008. Small cysts within liver. Remaining visualized upper abdomen unremarkable. Musculoskeletal: Osseous demineralization.  No acute bony findings. IMPRESSION: Significant atherosclerotic disease including coronary arteries. Old  granulomatous disease of the lungs with a noncalcified LEFT upper lobe nodule 5 mm diameter unchanged since 2010. Minimal scattered atelectasis and tiny RIGHT pleural effusion. Aortic Atherosclerosis (ICD10-I70.0). Electronically Signed   By: Ulyses Southward M.D.   On: 12/09/2017 12:54   Dg Chest Port 1 View  Result Date: 12/09/2017 CLINICAL DATA:  Shortness of breath, fever, cough, congestion EXAM: PORTABLE CHEST 1 VIEW COMPARISON:  12/04/2017 FINDINGS: Heart is normal size. No confluent airspace opacities. Mild peribronchial thickening and increased interstitial prominence in the  lung bases. These findings may reflect bronchitis. No effusions or acute bony abnormality. IMPRESSION: Bronchitic changes. Electronically Signed   By: Charlett Nose M.D.   On: 12/09/2017 08:58    Scheduled Meds: . aspirin  81 mg Oral Daily  . carvedilol  6.25 mg Oral BID  . colestipol  2 g Oral Daily  . doxycycline  100 mg Oral Q12H  . enoxaparin (LOVENOX) injection  40 mg Subcutaneous Q24H  . insulin aspart  0-9 Units Subcutaneous TID WC  . ipratropium-albuterol  3 mL Nebulization QID  . mirtazapine  30 mg Oral QHS  . pantoprazole  40 mg Oral Daily  . predniSONE  40 mg Oral Q breakfast  . sertraline  100 mg Oral Daily  . simvastatin  40 mg Oral q1800  . spironolactone  12.5 mg Oral Daily    Continuous Infusions: . 0.9 % NaCl with KCl 20 mEq / L 100 mL/hr at 12/10/17 0600     LOS: 1 day     Darlin Drop, MD [facility] Pager [phone]  If 7PM-7AM, please contact night-coverage [email or web address] Password [facility] 12/10/2017, 7:28 AM

## 2017-12-10 NOTE — Progress Notes (Signed)
Brief Nutrition Note:   Consulted for assessment per COPD protocol.   Pt is 84 yo female with PMH COPD, Afib, HTN, T2DM, CKD, GERD admitted 1/22 with SOB secondary to upper respiratory infection/reactive airway dz.   Case discussed with RN who reports pt with good appetite and able to provide history.  Reviewed records from [facility] for screening-- noted that pt was on regular diet PTA. Currently on Carb Modified diet in hospital.  Conducted NFPE-- pt did not have any fat/muscle depletions r/t malnutrition. Minor muscle loss dorsal hand likely r/t atrophy from shoulder injury.  Spoke with patient who reports good appetite. Eats three meals per day. Pt reports that she eats 60-100% of meals unless she does not care for the meals. Pt supplements with Ensure when she does not care for the food offered at the facility.  Reported eating a good breakfast this morning (pancakes and 2/3 grits).   Dietary Recall PTA: Breakfast-- cereal, banana in room or bacon, eggs, grits, biscuit, coffee in dining room Lunch/dinner-- eats meal offerred in dining room (e.g. meat with vegetable medley, potatoes, corn) or orders soup/sandwich in room. Dessert-- regularly eats dessert after meals (cake, pie, ice cream)  Snacks-- eats snacks in room from neice (mini pound cakes, ice cream sanwiches, microwave beef stew); also makes a point to snack on fruit (e.g. bananas) and crackers offered in facility throughout the day Drinks OJ, cranberry juice, prune juice daily. Drinks water when thirsty and occassionally drinks cola.  Pt denies any history of weight loss or issues eating. Pt felt she ate a little less in the three days leading up to admission, but reported that her appetite is good again now that she feels better.   No further nutrition interventions warranted at this time. Please consult RD if future nutrition needs arise.   Marjie Skiffherie N. Timber Lucarelli, MS Dietetic Intern [phone]

## 2017-12-11 LAB — CBC
HEMATOCRIT: 36.5 % (ref 36.0–46.0)
HEMOGLOBIN: 12.5 g/dL (ref 12.0–15.0)
MCH: 30.9 pg (ref 26.0–34.0)
MCHC: 34.2 g/dL (ref 30.0–36.0)
MCV: 90.3 fL (ref 78.0–100.0)
Platelets: 177 10*3/uL (ref 150–400)
RBC: 4.04 MIL/uL (ref 3.87–5.11)
RDW: 13.9 % (ref 11.5–15.5)
WBC: 10 10*3/uL (ref 4.0–10.5)

## 2017-12-11 LAB — GLUCOSE, CAPILLARY
GLUCOSE-CAPILLARY: 147 mg/dL — AB (ref 65–99)
Glucose-Capillary: 94 mg/dL (ref 65–99)

## 2017-12-11 LAB — BASIC METABOLIC PANEL
Anion gap: 10 (ref 5–15)
BUN: 24 mg/dL — AB (ref 6–20)
CHLORIDE: 108 mmol/L (ref 101–111)
CO2: 25 mmol/L (ref 22–32)
Calcium: 9.1 mg/dL (ref 8.9–10.3)
Creatinine, Ser: 1.06 mg/dL — ABNORMAL HIGH (ref 0.44–1.00)
GFR calc Af Amer: 51 mL/min — ABNORMAL LOW (ref >60)
GFR calc non Af Amer: 44 mL/min — ABNORMAL LOW (ref >60)
GLUCOSE: 96 mg/dL (ref 65–99)
POTASSIUM: 3.8 mmol/L (ref 3.5–5.1)
Sodium: 143 mmol/L (ref 135–145)

## 2017-12-11 MED ORDER — DOXYCYCLINE HYCLATE 100 MG PO TABS: 100.0000 mg | ORAL_TABLET | Freq: Two times a day (BID) | ORAL | 0 refills | Status: AC

## 2017-12-11 MED ORDER — COLESTIPOL HCL 1 G PO TABS: 2.0000 g | ORAL_TABLET | Freq: Every day | ORAL | 0 refills | Status: AC

## 2017-12-11 MED ORDER — PREDNISONE 20 MG PO TABS: 40.0000 mg | ORAL_TABLET | Freq: Every day | ORAL | 0 refills | Status: DC

## 2017-12-11 MED ORDER — IPRATROPIUM-ALBUTEROL 0.5-2.5 (3) MG/3ML IN SOLN: 3.0000 mL | RESPIRATORY_TRACT | Status: DC | PRN

## 2017-12-11 NOTE — Discharge Summary (Addendum)
Discharge Summary  TONIQUA MELAMED Jimenez:[id number] DOB: Aug 22, 1923  PCP: [name], MD  Admit date: 12/09/2017 Discharge date: 12/11/2017  Time spent: 25 minutes  Recommendations for Outpatient Follow-up:  1. Follow up with your PCP within a week 2. Please take your medications as prescribed  Discharge Diagnoses:  Active Hospital Problems   Diagnosis Date Noted  . Reactive airway disease 12/09/2017  . Anxiety and depression 12/09/2017  . Acute respiratory failure (HCC) 12/09/2017  . Atrial fibrillation (HCC) 11/08/2008  . Type 2 diabetes mellitus with diabetic neuropathy, without long-term current use of insulin (HCC) 11/08/2008  . Essential hypertension 11/08/2008  . GERD 11/08/2008    Resolved Hospital Problems  No resolved problems to display.    Discharge Condition: Stable; asymptomatic  Diet recommendation: Resume previous diet  Vitals:   12/11/17 0750 12/11/17 1155  BP:  (!) 171/73  Pulse:  61  Resp:  17  Temp:  98.2 F (36.8 C)  SpO2: 93% 91%    History of present illness:  Kristen Khader Rickettsis an 84 y.o.femalefrom assisted living who presented with persistent cough and wheezing of 3 months duration. Admitted for reactive airway disease. Received round the clock breathing treatments and aggressive pulmonary toilet with improvement of symptoms.   On the day of discharge the patient reports feeling better. Wheezing had resolved. Denies chest pain and dyspnea. Will need to follow up with her PCP post hospitalization.  12/11/17: 12 leads EKG was done due to appearance of changes on telemetry leads. Patient asymptomatic during this time. Denies chest pain, palpitations, or dyspnea.  12 leads EKG revealed chronic left bundle branch block.  Patient inquires about receiving a prescription for Valium. Writer/provider informed the patient that the medication was not started in the hospital and that she will need to be re-evaluated in the outpatient  setting for the need to continue the medication. To note, patient is on Zoloft and Mirtazepine with recent multiple falls.    Hospital Course:  Principal Problem:   Reactive airway disease Active Problems:   Type 2 diabetes mellitus with diabetic neuropathy, without long-term current use of insulin (HCC)   Essential hypertension   Atrial fibrillation (HCC)   GERD   Anxiety and depression   Acute respiratory failure (HCC)  Reactive airway disease -Resolving -complete doxycycline x 4 more days -complete prednisone 40 mg po daily x 4 more days -Flu swab is negative  Paroxysmal A-fib -rate controlled -continue carvedilol -no oral anticoagulation due to recent multiple falls  DM2 -Diet controlled -continue SSI  -A1C 6.3 (04/13/2016)  HTN -stable -Continue carvedilol and spironolactone  GERD -Stable -Continue Prilosec  Anxiety/depression Stable -Continue mirtazapine and sertraline  Chronic dCHF -last echo was noted to haveEF 60% on 04/14/16 -not in acute exacerbation -continue home meds  Ambulatory dysfunction -PT evaluate and treat -fall precaution  Chronic left bundle branch block -no chest pain   Procedures:  none   Consultations:  none  Discharge Exam: BP (!) 171/73 (BP Location: Left Arm)   Pulse 61   Temp 98.2 F (36.8 C) (Oral)   Resp 17   Ht 5\' 10"  (1.778 m)   Wt 91.4 kg (201 lb 8 oz)   SpO2 91%   BMI 28.91 kg/m   General: 84 yo CF WD WN NAD   Cardiovascular: RRR no rubs or gallops Respiratory: CTA no wheezes. Mild rales at bases.  Discharge Instructions You were cared for by a hospitalist during your hospital stay. If you have any questions about  your discharge medications or the care you received while you were in the hospital after you are discharged, you can call the unit and asked to speak with the hospitalist on call if the hospitalist that took care of you is not available. Once you are discharged, your primary care  physician will handle any further medical issues. Please note that NO REFILLS for any discharge medications will be authorized once you are discharged, as it is imperative that you return to your primary care physician (or establish a relationship with a primary care physician if you do not have one) for your aftercare needs so that they can reassess your need for medications and monitor your lab values.   Allergies as of 12/11/2017      Reactions   Fosamax [alendronate Sodium] Swelling   Penicillins Swelling, Other (See Comments)   Swelling, redness and warmth at injection site Has patient had a PCN reaction causing immediate rash, facial/tongue/throat swelling, SOB or lightheadedness with hypotension: No Has patient had a PCN reaction causing severe rash involving mucus membranes or skin necrosis: No Has patient had a PCN reaction that required hospitalization: No Has patient had a PCN reaction occurring within the last 10 years: No If all of the above answers are "NO", then may proceed with Cephalosporin use.   Sulfonamide Derivatives Swelling   Tetanus Toxoid Swelling      Medication List    STOP taking these medications   diazepam 5 MG tablet Commonly known as:  VALIUM   HYDROcodone-acetaminophen 5-325 MG tablet Commonly known as:  NORCO/VICODIN   potassium chloride 10 MEQ tablet Commonly known as:  K-DUR,KLOR-CON   potassium chloride 10 MEQ tablet Commonly known as:  K-DUR     TAKE these medications   acetaminophen 325 MG tablet Commonly known as:  TYLENOL Take 325 mg by mouth every 6 (six) hours as needed (pain).   aspirin 81 MG chewable tablet Chew 81 mg by mouth daily.   benzonatate 100 MG capsule Commonly known as:  TESSALON Take 1 capsule (100 mg total) by mouth 3 (three) times daily as needed for cough.   carvedilol 6.25 MG tablet Commonly known as:  COREG Take 6.25 mg by mouth 2 (two) times daily.   colestipol 1 g tablet Commonly known as:  COLESTID Take  2 tablets (2 g total) by mouth daily. Hold FOR CONSTIPATION What changed:  additional instructions   doxycycline 100 MG tablet Commonly known as:  VIBRA-TABS Take 1 tablet (100 mg total) by mouth every 12 (twelve) hours for 4 days.   furosemide 40 MG tablet Commonly known as:  LASIX Take 40 mg by mouth daily.   lactobacillus acidophilus & bulgar chewable tablet Chew 1 tablet by mouth daily.   loperamide 2 MG tablet Commonly known as:  IMODIUM A-D Take 2-4 mg by mouth See admin instructions. Take 2 tablets (4 mg) by mouth as needed after 1st loose stool followed by 1 tablet (2 mg) after each subsequent loose stool.   magnesium hydroxide 400 MG/5ML suspension Commonly known as:  MILK OF MAGNESIA Take 15 mLs by mouth daily as needed (constipation).   mirtazapine 30 MG tablet Commonly known as:  REMERON Take 30 mg by mouth at bedtime.   omeprazole 20 MG capsule Commonly known as:  PRILOSEC Take 20 mg by mouth daily.   ondansetron 4 MG tablet Commonly known as:  ZOFRAN Take 4 mg by mouth daily as needed for nausea.   predniSONE 20 MG tablet Commonly known  as:  DELTASONE Take 2 tablets (40 mg total) by mouth daily with breakfast. Start taking on:  12/12/2017   PRESERVISION AREDS PO Take 1 tablet by mouth daily.   ROBAFEN 100 MG/5ML syrup Generic drug:  guaifenesin Take 100 mg by mouth every 8 (eight) hours as needed for cough.   sertraline 100 MG tablet Commonly known as:  ZOLOFT Take 100 mg by mouth daily.   simvastatin 40 MG tablet Commonly known as:  ZOCOR Take 40 mg by mouth daily.   spironolactone 25 MG tablet Commonly known as:  ALDACTONE Take 0.5 tablets (12.5 mg total) by mouth daily.      Allergies  Allergen Reactions  . Fosamax [Alendronate Sodium] Swelling  . Penicillins Swelling and Other (See Comments)    Swelling, redness and warmth at injection site Has patient had a PCN reaction causing immediate rash, facial/tongue/throat swelling, SOB or  lightheadedness with hypotension: No Has patient had a PCN reaction causing severe rash involving mucus membranes or skin necrosis: No Has patient had a PCN reaction that required hospitalization: No Has patient had a PCN reaction occurring within the last 10 years: No If all of the above answers are "NO", then may proceed with Cephalosporin use.  . Sulfonamide Derivatives Swelling  . Tetanus Toxoid Swelling   Follow-up Information    [name], MD Follow up.   Specialty:  Family Medicine Contact information: [address] [place] [address] [phone]            The results of significant diagnostics from this hospitalization (including imaging, microbiology, ancillary and laboratory) are listed below for reference.    Significant Diagnostic Studies: Dg Chest 2 View  Result Date: 12/04/2017 CLINICAL DATA:  84 year old female with cough. EXAM: CHEST  2 VIEW COMPARISON:  Chest radiograph dated 05/06/2016 FINDINGS: The lungs are clear. There is no pleural effusion or pneumothorax. The cardiac silhouette is within normal limits. Atherosclerotic calcification of the aortic arch. Osteopenia with degenerative changes of the spine. No acute osseous pathology. IMPRESSION: No active cardiopulmonary disease. Electronically Signed   By: Elgie Collard M.D.   On: 12/04/2017 02:55   Ct Chest W Contrast  Result Date: 12/09/2017 CLINICAL DATA:  Diagnosed with a viral respiratory infection on 12/04/2017, progressive shortness of breath and wheezing over past couple days, history hypertension, type II diabetes mellitus, atrial fibrillation, ischemic cardiomyopathy, GERD, acute on chronic combined CHF EXAM: CT CHEST WITH CONTRAST TECHNIQUE: Multidetector CT imaging of the chest was performed during intravenous contrast administration. Sagittal and coronal MPR images reconstructed from axial data set. CONTRAST:  60 mL ISOVUE-300 IOPAMIDOL (ISOVUE-300) INJECTION 61% IV  COMPARISON:  03/17/2009 CT chest, CT abdomen and pelvis 11/25/2008 FINDINGS: Cardiovascular: Atherosclerotic calcifications aorta, coronary arteries and proximal great vessels. Aorta normal caliber without aneurysm or dissection. Small amount of pericardial fluid. Streak artifacts traverse scattered pulmonary arteries especially at the lower lungs. Pulmonary arteries otherwise grossly unremarkable. Mediastinum/Nodes: Few scattered normal mediastinal lymph nodes without thoracic adenopathy. Esophagus unremarkable for technique. Base of cervical region normal appearance. Lungs/Pleura: Mild scattered atelectasis predominantly towards lung bases. No acute infiltrate or pneumothorax. Small nodular focus in LEFT upper lobe 5 mm diameter image 95 grossly unchanged. Calcified granuloma RIGHT upper lobe image 58. Tiny RIGHT pleural effusion. Calcified granulomata within the posterior lower lobes neck os phrenic angles. Upper Abdomen: Small cyst lateral to the upper pole RIGHT kidney 2.8 x 2.2 cm image 171 present since at least 11/25/2008. Small cysts within liver. Remaining visualized  upper abdomen unremarkable. Musculoskeletal: Osseous demineralization.  No acute bony findings. IMPRESSION: Significant atherosclerotic disease including coronary arteries. Old granulomatous disease of the lungs with a noncalcified LEFT upper lobe nodule 5 mm diameter unchanged since 2010. Minimal scattered atelectasis and tiny RIGHT pleural effusion. Aortic Atherosclerosis (ICD10-I70.0). Electronically Signed   By: Ulyses Southward M.D.   On: 12/09/2017 12:54   Dg Chest Port 1 View  Result Date: 12/09/2017 CLINICAL DATA:  Shortness of breath, fever, cough, congestion EXAM: PORTABLE CHEST 1 VIEW COMPARISON:  12/04/2017 FINDINGS: Heart is normal size. No confluent airspace opacities. Mild peribronchial thickening and increased interstitial prominence in the lung bases. These findings may reflect bronchitis. No effusions or acute bony  abnormality. IMPRESSION: Bronchitic changes. Electronically Signed   By: Charlett Nose M.D.   On: 12/09/2017 08:58    Microbiology: Recent Results (from the past 240 hour(s))  Blood culture (routine x 2)     Status: None (Preliminary result)   Collection Time: 12/09/17  9:01 AM  Result Value Ref Range Status   Specimen Description BLOOD LEFT ANTECUBITAL  Final   Special Requests   Final    BOTTLES DRAWN AEROBIC AND ANAEROBIC Blood Culture adequate volume   Culture NO GROWTH 2 DAYS  Final   Report Status PENDING  Incomplete  Blood culture (routine x 2)     Status: None (Preliminary result)   Collection Time: 12/09/17  9:08 AM  Result Value Ref Range Status   Specimen Description BLOOD RIGHT HAND  Final   Special Requests   Final    BOTTLES DRAWN AEROBIC AND ANAEROBIC Blood Culture adequate volume   Culture NO GROWTH 2 DAYS  Final   Report Status PENDING  Incomplete  MRSA PCR Screening     Status: None   Collection Time: 12/09/17  4:32 PM  Result Value Ref Range Status   MRSA by PCR NEGATIVE NEGATIVE Final    Comment:        The GeneXpert MRSA Assay (FDA approved for NASAL specimens only), is one component of a comprehensive MRSA colonization surveillance program. It is not intended to diagnose MRSA infection nor to guide or monitor treatment for MRSA infections.      Labs: Basic Metabolic Panel: Recent Labs  Lab 12/09/17 0837 12/10/17 0349 12/11/17 0740  NA 141 139 143  K 3.6 4.0 3.8  CL 103 105 108  CO2 24 20* 25  GLUCOSE 130* 166* 96  BUN 19 25* 24*  CREATININE 1.09* 1.08* 1.06*  CALCIUM 8.8* 9.0 9.1   Liver Function Tests: Recent Labs  Lab 12/09/17 0837  AST 29  ALT 20  ALKPHOS 55  BILITOT 0.8  PROT 6.5  ALBUMIN 3.2*   No results for input(s): LIPASE, AMYLASE in the last 168 hours. No results for input(s): AMMONIA in the last 168 hours. CBC: Recent Labs  Lab 12/09/17 0837 12/10/17 0349 12/11/17 0421  WBC 7.5 7.4 10.0  NEUTROABS 3.8  --   --     HGB 14.0 12.8 12.5  HCT 41.0 38.2 36.5  MCV 90.9 91.0 90.3  PLT 130* 135* 177   Cardiac Enzymes: Recent Labs  Lab 12/09/17 0837  TROPONINI <0.03   BNP: BNP (last 3 results) Recent Labs    12/09/17 0837  BNP 45.8    ProBNP (last 3 results) No results for input(s): PROBNP in the last 8760 hours.  CBG: Recent Labs  Lab 12/10/17 1226 12/10/17 1724 12/10/17 2008 12/11/17 0749 12/11/17 1158  GLUCAP 165* 179* 175*  94 147*       Signed:  Darlin Droparole N Clemence Stillings, MD [facility] 12/11/2017, 1:45 PM

## 2017-12-11 NOTE — Care Management Note (Signed)
Case Management Note  Patient Details  Name: [name] MRN: [id number] Date of Birth: 07-15-1923  Subjective/Objective:                    Action/Plan:  PTA from [facility]F [facility].  Pt uses walker, cane and has wheelchair in the home if needed.  CSW consulted   Expected Discharge Date:                  Expected Discharge Plan:  Assisted Living / Rest Home(From [facility])  In-House Referral:     Discharge planning Services  CM Consult  Post Acute Care Choice:    Choice offered to:     DME Arranged:    DME Agency:     HH Arranged:    HH Agency:     Status of Service:     If discussed at [employer] Meetings, dates discussed:    Additional Comments:  Cherylann ParrClaxton, Andrw Mcguirt S, RN 12/11/2017, 10:21 AM

## 2017-12-11 NOTE — Clinical Social Work Note (Signed)
Clinical Social Work Assessment  Patient Details  Name: [name] MRN: [id number] Date of Birth: 1923-06-12  Date of referral:  12/11/17               Reason for consult:  Discharge Planning                Permission sought to share information with:  Chartered certified accountant granted to share information::  Yes, Verbal Permission Granted  Name::        Agency::  [facility]: [facility] side  Relationship::     Contact Information:     Housing/Transportation Living arrangements for the past 2 months:  [place] of Information:  Patient, Medical Team, Facility Patient Interpreter Needed:  None Criminal Activity/Legal Involvement Pertinent to Current Situation/Hospitalization:  No - Comment as needed Significant Relationships:  Other(Comment)(Niece) Lives with:  Facility Resident Do you feel safe going back to the place where you live?  Yes Need for family participation in patient care:  Yes (Comment)  Care giving concerns:  Patient is a resident at [facility] on the [facility] side.   Social Worker assessment / plan:  CSW met with patient. No supports at bedside. CSW introduced role and explained that discharge planning would be discussed. Patient confirmed she is from [facility] and plans to return today. She has already notified her niece. Patient will need PTAR. Med tech will review discharge summary and FL2 with her supervisor and call back to notify if any changes need to be made or if patient can return. No further concerns. CSW encouraged patient to contact CSW as needed. CSW will continue to follow patient for support and facilitate discharge back to ALF once medically stable.  Employment status:  Retired Nurse, adult PT Recommendations:  Not assessed at this time Information / Referral to community resources:  Other (Comment Required)(Plan is to return to ALF.)  Patient/Family's  Response to care:  Patient agreeable to return to ALF. Patient's nieces supportive and involved in patient's care. Patient appreciated social work intervention.  Patient/Family's Understanding of and Emotional Response to Diagnosis, Current Treatment, and Prognosis:  Patient has a good understanding of the reason for admission and the plan to return to ALF today. Patient appears happy with hospital care.  Emotional Assessment Appearance:  Appears stated age Attitude/Demeanor/Rapport:  Engaged, Gracious Affect (typically observed):  Accepting, Appropriate, Calm, Pleasant Orientation:  Oriented to Self, Oriented to Place, Oriented to  Time, Oriented to Situation Alcohol / Substance use:  Never Used Psych involvement (Current and /or in the community):  No (Comment)  Discharge Needs  Concerns to be addressed:  Care Coordination Readmission within the last 30 days:  No Current discharge risk:  None Barriers to Discharge:  Other(Waiting on ALF to review documentation.)   Candie Chroman, LCSW 12/11/2017, 1:15 PM

## 2017-12-11 NOTE — Plan of Care (Signed)
Patient received all discharge paperwork. Verbalized understand of changes in medications. Pt waiting for transport. All personal belongings with patient.

## 2017-12-11 NOTE — Consult Note (Signed)
   [facility] CM Inpatient Consult   12/11/2017  [name] C Segar 20-Jan-1923 [id number]  Patient screened for potential [facility] Care Management services due to high risk for re-admission score. Patient is in the ACO of the [facility] services under [facility]'s [facility] plan.  Patient is to [facility]. No community care management needs noted. Her primary care provider is Dr. Lupita RaiderKimberlee Shaw of [facility], in which this office is listed to provide the transition of care follow up.  For questions contact:   Charlesetta ShanksVictoria Draeden Kellman, RN BSN CCM [facility] Liaison  [phone] business mobile phone Toll free office [phone]

## 2017-12-11 NOTE — Discharge Instructions (Signed)

## 2017-12-11 NOTE — Progress Notes (Signed)
SATURATION QUALIFICATIONS: (This note is used to comply with regulatory documentation for home oxygen)  Patient Saturations on Room Air at Rest = 97%  Patient Saturations on Room Air while Ambulating =96*%  Patient Saturations on 0 Liters of oxygen while Ambulating = 96%  Please briefly explain why patient needs home oxygen: Pt. Does not need oxygen at home. Stats stay above 90%

## 2017-12-11 NOTE — Discharge Summary (Addendum)
Discharge Summary  [name] WUJ:[id number] DOB: 1923/09/08  PCP: [name], MD  Admit date: 12/09/2017 Discharge date: 12/11/2017  Time spent: 25 minutes  Recommendations for Outpatient Follow-up:  1. Follow up with your PCP within a week 2. Please take your medications as prescribed  Discharge Diagnoses:  Active Hospital Problems   Diagnosis Date Noted  . Reactive airway disease 12/09/2017  . Anxiety and depression 12/09/2017  . Acute respiratory failure (HCC) 12/09/2017  . Atrial fibrillation (HCC) 11/08/2008  . Type 2 diabetes mellitus with diabetic neuropathy, without long-term current use of insulin (HCC) 11/08/2008  . Essential hypertension 11/08/2008  . GERD 11/08/2008    Resolved Hospital Problems  No resolved problems to display.    Discharge Condition: Stable; asymptomatic  Diet recommendation: Resume previous diet  Vitals:   12/11/17 0742 12/11/17 0750  BP: (!) 171/97   Pulse: 73   Resp: 17   Temp: 98.1 F (36.7 C)   SpO2: 90% 93%    History of present illness:  Kristen Scheff Rickettsis an 83 y.o.[facility] living who presented with persistent cough and wheezing of 3 months duration. Admitted for reactive airway disease. Received round the clock breathing treatments and aggressive pulmonary toilet with improvement of symptoms.   On the day of discharge the patient reports feeling better. Wheezing had resolved. Denies chest pain and dyspnea. Will need to follow up with her PCP post hospitalization.  12/11/17: 12 leads EKG was done due to appearance of changes on telemetry leads. Patient asymptomatic during this time. Denies chest pain, palpitations, or dyspnea.  12 leads EKG revealed chronic left bundle branch block.    Hospital Course:  Principal Problem:   Reactive airway disease Active Problems:   Type 2 diabetes mellitus with diabetic neuropathy, without long-term current use of insulin (HCC)   Essential  hypertension   Atrial fibrillation (HCC)   GERD   Anxiety and depression   Acute respiratory failure (HCC)  Reactive airway disease -Resolving -complete doxycycline x 4 more days -complete prednisone 40 mg po daily x 4 more days -Flu swab is negative  Paroxysmal A-fib -rate controlled -continue carvedilol -no oral anticoagulation due to recent multiple falls  DM2 -Diet controlled -continue SSI  -A1C 6.3 (04/13/2016)  HTN -stable -Continue carvedilol and spironolactone  GERD -Stable -Continue Prilosec  Anxiety/depression Stable -Continue mirtazapine and sertraline  Chronic dCHF -last echo was noted to haveEF 60% on 04/14/16 -not in acute exacerbation -continue home meds  Ambulatory dysfunction -PT evaluate and treat -fall precaution  Chronic left bundle branch block -no chest pain   Procedures:  none   Consultations:  none  Discharge Exam: BP (!) 171/97 (BP Location: Left Arm)   Pulse 73   Temp 98.1 F (36.7 C) (Oral)   Resp 17   Ht 5\' 10"  (1.778 m)   Wt 91.4 kg (201 lb 8 oz)   SpO2 93%   BMI 28.91 kg/m   General: 84 yo CF WD WN NAD   Cardiovascular: RRR no rubs or gallops Respiratory: CTA no wheezes. Mild rales at bases.  Discharge Instructions You were cared for by a hospitalist during your hospital stay. If you have any questions about your discharge medications or the care you received while you were in the hospital after you are discharged, you can call the unit and asked to speak with the hospitalist on call if the hospitalist that took care of you is not available. Once you are discharged, your primary care physician will handle  any further medical issues. Please note that NO REFILLS for any discharge medications will be authorized once you are discharged, as it is imperative that you return to your primary care physician (or establish a relationship with a primary care physician if you do not have one) for your aftercare needs so  that they can reassess your need for medications and monitor your lab values.   Allergies as of 12/11/2017      Reactions   Fosamax [alendronate Sodium] Swelling   Penicillins Swelling, Other (See Comments)   Swelling, redness and warmth at injection site Has patient had a PCN reaction causing immediate rash, facial/tongue/throat swelling, SOB or lightheadedness with hypotension: No Has patient had a PCN reaction causing severe rash involving mucus membranes or skin necrosis: No Has patient had a PCN reaction that required hospitalization: No Has patient had a PCN reaction occurring within the last 10 years: No If all of the above answers are "NO", then may proceed with Cephalosporin use.   Sulfonamide Derivatives Swelling   Tetanus Toxoid Swelling      Medication List    STOP taking these medications   diazepam 5 MG tablet Commonly known as:  VALIUM   HYDROcodone-acetaminophen 5-325 MG tablet Commonly known as:  NORCO/VICODIN   potassium chloride 10 MEQ tablet Commonly known as:  K-DUR     TAKE these medications   acetaminophen 325 MG tablet Commonly known as:  TYLENOL Take 325 mg by mouth every 6 (six) hours as needed (pain).   aspirin 81 MG chewable tablet Chew 81 mg by mouth daily.   benzonatate 100 MG capsule Commonly known as:  TESSALON Take 1 capsule (100 mg total) by mouth 3 (three) times daily as needed for cough.   carvedilol 6.25 MG tablet Commonly known as:  COREG Take 6.25 mg by mouth 2 (two) times daily.   colestipol 1 g tablet Commonly known as:  COLESTID Take 2 tablets by mouth daily. Hold FOR CONSTIPATION OR decrease dose to 1 g daily   doxycycline 100 MG tablet Commonly known as:  VIBRA-TABS Take 1 tablet (100 mg total) by mouth every 12 (twelve) hours for 4 days.   furosemide 40 MG tablet Commonly known as:  LASIX Take 40 mg by mouth daily.   lactobacillus acidophilus & bulgar chewable tablet Chew 1 tablet by mouth daily.   loperamide 2  MG tablet Commonly known as:  IMODIUM A-D Take 2-4 mg by mouth See admin instructions. Take 2 tablets (4 mg) by mouth as needed after 1st loose stool followed by 1 tablet (2 mg) after each subsequent loose stool.   magnesium hydroxide 400 MG/5ML suspension Commonly known as:  MILK OF MAGNESIA Take 15 mLs by mouth daily as needed (constipation).   mirtazapine 30 MG tablet Commonly known as:  REMERON Take 30 mg by mouth at bedtime.   omeprazole 20 MG capsule Commonly known as:  PRILOSEC Take 20 mg by mouth daily.   ondansetron 4 MG tablet Commonly known as:  ZOFRAN Take 4 mg by mouth daily as needed for nausea.   potassium chloride 10 MEQ tablet Commonly known as:  K-DUR,KLOR-CON Take 20 mEq by mouth daily.   predniSONE 20 MG tablet Commonly known as:  DELTASONE Take 2 tablets (40 mg total) by mouth daily with breakfast. Start taking on:  12/12/2017   PRESERVISION AREDS PO Take 1 tablet by mouth daily.   ROBAFEN 100 MG/5ML syrup Generic drug:  guaifenesin Take 100 mg by mouth every 8 (eight)  hours as needed for cough.   sertraline 100 MG tablet Commonly known as:  ZOLOFT Take 100 mg by mouth daily.   simvastatin 40 MG tablet Commonly known as:  ZOCOR Take 40 mg by mouth daily.   spironolactone 25 MG tablet Commonly known as:  ALDACTONE Take 0.5 tablets (12.5 mg total) by mouth daily.      Allergies  Allergen Reactions  . Fosamax [Alendronate Sodium] Swelling  . Penicillins Swelling and Other (See Comments)    Swelling, redness and warmth at injection site Has patient had a PCN reaction causing immediate rash, facial/tongue/throat swelling, SOB or lightheadedness with hypotension: No Has patient had a PCN reaction causing severe rash involving mucus membranes or skin necrosis: No Has patient had a PCN reaction that required hospitalization: No Has patient had a PCN reaction occurring within the last 10 years: No If all of the above answers are "NO", then may  proceed with Cephalosporin use.  . Sulfonamide Derivatives Swelling  . Tetanus Toxoid Swelling   Follow-up Information    [name], MD Follow up.   Specialty:  Family Medicine Contact information: [address] [place] [address] [phone]            The results of significant diagnostics from this hospitalization (including imaging, microbiology, ancillary and laboratory) are listed below for reference.    Significant Diagnostic Studies: Dg Chest 2 View  Result Date: 12/04/2017 CLINICAL DATA:  84 year old female with cough. EXAM: CHEST  2 VIEW COMPARISON:  Chest radiograph dated 05/06/2016 FINDINGS: The lungs are clear. There is no pleural effusion or pneumothorax. The cardiac silhouette is within normal limits. Atherosclerotic calcification of the aortic arch. Osteopenia with degenerative changes of the spine. No acute osseous pathology. IMPRESSION: No active cardiopulmonary disease. Electronically Signed   By: Elgie Collard M.D.   On: 12/04/2017 02:55   Ct Chest W Contrast  Result Date: 12/09/2017 CLINICAL DATA:  Diagnosed with a viral respiratory infection on 12/04/2017, progressive shortness of breath and wheezing over past couple days, history hypertension, type II diabetes mellitus, atrial fibrillation, ischemic cardiomyopathy, GERD, acute on chronic combined CHF EXAM: CT CHEST WITH CONTRAST TECHNIQUE: Multidetector CT imaging of the chest was performed during intravenous contrast administration. Sagittal and coronal MPR images reconstructed from axial data set. CONTRAST:  60 mL ISOVUE-300 IOPAMIDOL (ISOVUE-300) INJECTION 61% IV COMPARISON:  03/17/2009 CT chest, CT abdomen and pelvis 11/25/2008 FINDINGS: Cardiovascular: Atherosclerotic calcifications aorta, coronary arteries and proximal great vessels. Aorta normal caliber without aneurysm or dissection. Small amount of pericardial fluid. Streak artifacts traverse scattered pulmonary arteries  especially at the lower lungs. Pulmonary arteries otherwise grossly unremarkable. Mediastinum/Nodes: Few scattered normal mediastinal lymph nodes without thoracic adenopathy. Esophagus unremarkable for technique. Base of cervical region normal appearance. Lungs/Pleura: Mild scattered atelectasis predominantly towards lung bases. No acute infiltrate or pneumothorax. Small nodular focus in LEFT upper lobe 5 mm diameter image 95 grossly unchanged. Calcified granuloma RIGHT upper lobe image 58. Tiny RIGHT pleural effusion. Calcified granulomata within the posterior lower lobes neck os phrenic angles. Upper Abdomen: Small cyst lateral to the upper pole RIGHT kidney 2.8 x 2.2 cm image 171 present since at least 11/25/2008. Small cysts within liver. Remaining visualized upper abdomen unremarkable. Musculoskeletal: Osseous demineralization.  No acute bony findings. IMPRESSION: Significant atherosclerotic disease including coronary arteries. Old granulomatous disease of the lungs with a noncalcified LEFT upper lobe nodule 5 mm diameter unchanged since 2010. Minimal scattered atelectasis and tiny RIGHT pleural effusion. Aortic Atherosclerosis (ICD10-I70.0).  Electronically Signed   By: Ulyses SouthwardMark  Boles M.D.   On: 12/09/2017 12:54   Dg Chest Port 1 View  Result Date: 12/09/2017 CLINICAL DATA:  Shortness of breath, fever, cough, congestion EXAM: PORTABLE CHEST 1 VIEW COMPARISON:  12/04/2017 FINDINGS: Heart is normal size. No confluent airspace opacities. Mild peribronchial thickening and increased interstitial prominence in the lung bases. These findings may reflect bronchitis. No effusions or acute bony abnormality. IMPRESSION: Bronchitic changes. Electronically Signed   By: Charlett NoseKevin  Dover M.D.   On: 12/09/2017 08:58    Microbiology: Recent Results (from the past 240 hour(s))  Blood culture (routine x 2)     Status: None (Preliminary result)   Collection Time: 12/09/17  9:01 AM  Result Value Ref Range Status   Specimen  Description BLOOD LEFT ANTECUBITAL  Final   Special Requests   Final    BOTTLES DRAWN AEROBIC AND ANAEROBIC Blood Culture adequate volume   Culture NO GROWTH 2 DAYS  Final   Report Status PENDING  Incomplete  Blood culture (routine x 2)     Status: None (Preliminary result)   Collection Time: 12/09/17  9:08 AM  Result Value Ref Range Status   Specimen Description BLOOD RIGHT HAND  Final   Special Requests   Final    BOTTLES DRAWN AEROBIC AND ANAEROBIC Blood Culture adequate volume   Culture NO GROWTH 2 DAYS  Final   Report Status PENDING  Incomplete  MRSA PCR Screening     Status: None   Collection Time: 12/09/17  4:32 PM  Result Value Ref Range Status   MRSA by PCR NEGATIVE NEGATIVE Final    Comment:        The GeneXpert MRSA Assay (FDA approved for NASAL specimens only), is one component of a comprehensive MRSA colonization surveillance program. It is not intended to diagnose MRSA infection nor to guide or monitor treatment for MRSA infections.      Labs: Basic Metabolic Panel: Recent Labs  Lab 12/09/17 0837 12/10/17 0349 12/11/17 0740  NA 141 139 143  K 3.6 4.0 3.8  CL 103 105 108  CO2 24 20* 25  GLUCOSE 130* 166* 96  BUN 19 25* 24*  CREATININE 1.09* 1.08* 1.06*  CALCIUM 8.8* 9.0 9.1   Liver Function Tests: Recent Labs  Lab 12/09/17 0837  AST 29  ALT 20  ALKPHOS 55  BILITOT 0.8  PROT 6.5  ALBUMIN 3.2*   No results for input(s): LIPASE, AMYLASE in the last 168 hours. No results for input(s): AMMONIA in the last 168 hours. CBC: Recent Labs  Lab 12/09/17 0837 12/10/17 0349 12/11/17 0421  WBC 7.5 7.4 10.0  NEUTROABS 3.8  --   --   HGB 14.0 12.8 12.5  HCT 41.0 38.2 36.5  MCV 90.9 91.0 90.3  PLT 130* 135* 177   Cardiac Enzymes: Recent Labs  Lab 12/09/17 0837  TROPONINI <0.03   BNP: BNP (last 3 results) Recent Labs    12/09/17 0837  BNP 45.8    ProBNP (last 3 results) No results for input(s): PROBNP in the last 8760  hours.  CBG: Recent Labs  Lab 12/10/17 0814 12/10/17 1226 12/10/17 1724 12/10/17 2008 12/11/17 0749  GLUCAP 100* 165* 179* 175* 94       Signed:  Darlin Droparole N Amon Costilla, MD [facility] 12/11/2017, 11:46 AM

## 2017-12-11 NOTE — NC FL2 (Signed)
[place] MEDICAID FL2 LEVEL OF CARE SCREENING TOOL     IDENTIFICATION  Patient Name: [name] Birthdate: 1923-02-17 Sex: female Admission Date (Current Location): 12/09/2017  [facility] and [place] Number:  Producer, television/film/video and Address:  [facility], [address] [place], [place] [address]      Provider Number: [id number]  Attending Physician Name and Address:  Darlin Drop, DO  Relative Name and Phone Number:       Current Level of Care: Hospital Recommended Level of Care: Assisted Living Facility Prior Approval Number:    Date Approved/Denied:   PASRR Number:    Discharge Plan: Other (Comment)(ALF)    Current Diagnoses: Patient Active Problem List   Diagnosis Date Noted  . Reactive airway disease 12/09/2017  . Anxiety and depression 12/09/2017  . Acute respiratory failure (HCC) 12/09/2017  . Normocytic anemia 04/13/2016  . Acute on chronic combined systolic and diastolic CHF (congestive heart failure) (HCC) 04/13/2016  . Pyelonephritis 04/12/2016  . Chronic combined systolic and diastolic congestive heart failure (HCC) 11/14/2013  . Constipation 11/14/2013  . Nausea 11/14/2013  . Influenza with respiratory manifestations 11/09/2013  . AKI (acute kidney injury) (HCC) 11/09/2013  . Influenza 11/07/2013  . Acute on chronic combined systolic and diastolic congestive heart failure (HCC) 11/07/2013  . Acute respiratory failure with hypoxia (HCC) 11/07/2013  . Fever 11/07/2013  . Generalized weakness 11/07/2013  . Peripheral neuropathy 05/05/2012  . Closed left hip fracture (HCC) 05/04/2012  . Chronic systolic HF (heart failure) (HCC) 05/04/2012  . Arthritis 07/23/2011  . CHF (congestive heart failure) (HCC) 07/23/2011  . DYSPNEA 07/31/2010  . DEPRESSIVE DISORDER NOT ELSEWHERE CLASSIFIED 02/08/2009  . Type 2 diabetes mellitus with diabetic neuropathy, without long-term current use of insulin (HCC) 11/08/2008  .  HYPERLIPIDEMIA-MIXED 11/08/2008  . Essential hypertension 11/08/2008  . LBBB (left bundle branch block) 11/08/2008  . Atrial fibrillation (HCC) 11/08/2008  . GERD 11/08/2008  . HEPATIC CYST 11/08/2008  . CHOLELITHIASIS 11/08/2008  . DILATION AND CURETTAGE, HX OF 11/08/2008    Orientation RESPIRATION BLADDER Height & Weight     Self, Time, Situation, Place  Normal Continent Weight: 201 lb 8 oz (91.4 kg) Height:  5\' 10"  (177.8 cm)  BEHAVIORAL SYMPTOMS/MOOD NEUROLOGICAL BOWEL NUTRITION STATUS  (None) (None) Continent Diet(Non-concentrated sweets)  AMBULATORY STATUS COMMUNICATION OF NEEDS Skin     Verbally Bruising                       Personal Care Assistance Level of Assistance              Functional Limitations Info  Sight, Hearing, Speech Sight Info: Adequate Hearing Info: Adequate Speech Info: Adequate    SPECIAL CARE FACTORS FREQUENCY  Blood pressure                    Contractures Contractures Info: Not present    Additional Factors Info  Code Status, Allergies, Psychotropic Code Status Info: DNR Allergies Info: Fosamax (Alendronate Sodium), Penicillins, Sulfonamide Derivatives, Tetanus Toxoid. Psychotropic Info: Anxiety, Depression: Remeron 30 mg PO QHS, Zoloft 100 mg PO daily.         Current Medications (12/11/2017):  This is the current hospital active medication list Current Facility-Administered Medications  Medication Dose Route Frequency Provider Last Rate Last Dose  . aspirin chewable tablet 81 mg  81 mg Oral Daily Leandro Reasoner Tublu, MD   81 mg at 12/11/17 0853  .  benzonatate (TESSALON) capsule 100 mg  100 mg Oral TID PRN Pieter Partridge, MD   100 mg at 12/10/17 0450  . carvedilol (COREG) tablet 6.25 mg  6.25 mg Oral BID Leandro Reasoner Tublu, MD   6.25 mg at 12/11/17 0852  . colestipol (COLESTID) tablet 2 g  2 g Oral Daily Leandro Reasoner Tublu, MD   2 g at 12/11/17 0854  . doxycycline (VIBRA-TABS) tablet 100  mg  100 mg Oral Q12H Leandro Reasoner Tublu, MD   100 mg at 12/11/17 1610  . enoxaparin (LOVENOX) injection 40 mg  40 mg Subcutaneous Q24H Leandro Reasoner Tublu, MD   40 mg at 12/10/17 1756  . HYDROcodone-acetaminophen (NORCO/VICODIN) 5-325 MG per tablet 1 tablet  1 tablet Oral TID PRN Leandro Reasoner Tublu, MD      . insulin aspart (novoLOG) injection 0-9 Units  0-9 Units Subcutaneous TID WC Pieter Partridge, MD   1 Units at 12/11/17 1208  . ipratropium-albuterol (DUONEB) 0.5-2.5 (3) MG/3ML nebulizer solution 3 mL  3 mL Nebulization Q4H PRN Dow Adolph N, DO      . mirtazapine (REMERON) tablet 30 mg  30 mg Oral QHS Leandro Reasoner Tublu, MD   30 mg at 12/10/17 2130  . ondansetron (ZOFRAN) tablet 4 mg  4 mg Oral Daily PRN Leandro Reasoner Tublu, MD      . pantoprazole (PROTONIX) EC tablet 40 mg  40 mg Oral Daily Leandro Reasoner Tublu, MD   40 mg at 12/11/17 0852  . predniSONE (DELTASONE) tablet 40 mg  40 mg Oral Q breakfast Leandro Reasoner Tublu, MD   40 mg at 12/11/17 0851  . sertraline (ZOLOFT) tablet 100 mg  100 mg Oral Daily Leandro Reasoner Tublu, MD   100 mg at 12/11/17 0854  . simvastatin (ZOCOR) tablet 40 mg  40 mg Oral q1800 Leandro Reasoner Tublu, MD   40 mg at 12/10/17 1757  . spironolactone (ALDACTONE) tablet 12.5 mg  12.5 mg Oral Daily Leandro Reasoner Tublu, MD   12.5 mg at 12/11/17 9604     Discharge Medications: STOP taking these medications   diazepam 5 MG tablet Commonly known as:  VALIUM   HYDROcodone-acetaminophen 5-325 MG tablet Commonly known as:  NORCO/VICODIN   potassium chloride 10 MEQ tablet Commonly known as:  K-DUR,KLOR-CON   potassium chloride 10 MEQ tablet Commonly known as:  K-DUR     TAKE these medications   acetaminophen 325 MG tablet Commonly known as:  TYLENOL Take 325 mg by mouth every 6 (six) hours as needed (pain).   aspirin 81 MG chewable tablet Chew 81 mg by mouth daily.   benzonatate 100  MG capsule Commonly known as:  TESSALON Take 1 capsule (100 mg total) by mouth 3 (three) times daily as needed for cough.   carvedilol 6.25 MG tablet Commonly known as:  COREG Take 6.25 mg by mouth 2 (two) times daily.   colestipol 1 g tablet Commonly known as:  COLESTID Take 2 tablets (2 g total) by mouth daily. Hold FOR CONSTIPATION What changed:  additional instructions   doxycycline 100 MG tablet Commonly known as:  VIBRA-TABS Take 1 tablet (100 mg total) by mouth every 12 (twelve) hours for 4 days.   furosemide 40 MG tablet Commonly known as:  LASIX Take 40 mg by mouth daily.   lactobacillus acidophilus & bulgar chewable tablet Chew 1 tablet by mouth daily.   loperamide 2 MG tablet Commonly known as:  IMODIUM A-D Take 2-4 mg by mouth See  admin instructions. Take 2 tablets (4 mg) by mouth as needed after 1st loose stool followed by 1 tablet (2 mg) after each subsequent loose stool.   magnesium hydroxide 400 MG/5ML suspension Commonly known as:  MILK OF MAGNESIA Take 15 mLs by mouth daily as needed (constipation).   mirtazapine 30 MG tablet Commonly known as:  REMERON Take 30 mg by mouth at bedtime.   omeprazole 20 MG capsule Commonly known as:  PRILOSEC Take 20 mg by mouth daily.   ondansetron 4 MG tablet Commonly known as:  ZOFRAN Take 4 mg by mouth daily as needed for nausea.   predniSONE 20 MG tablet Commonly known as:  DELTASONE Take 2 tablets (40 mg total) by mouth daily with breakfast. Start taking on:  12/12/2017 complete prednisone 40 mg po daily x 4 more days   PRESERVISION AREDS PO Take 1 tablet by mouth daily.   ROBAFEN 100 MG/5ML syrup Generic drug:  guaifenesin Take 100 mg by mouth every 8 (eight) hours as needed for cough.   sertraline 100 MG tablet Commonly known as:  ZOLOFT Take 100 mg by mouth daily.   simvastatin 40 MG tablet Commonly known as:  ZOCOR Take 40 mg by mouth daily.   spironolactone 25 MG  tablet Commonly known as:  ALDACTONE Take 0.5 tablets (12.5 mg total) by mouth daily.                                                            Relevant Imaging Results:  Relevant Lab Results:   Additional Information SS#: [id number]  Margarito LinerSarah C Nefertiti Mohamad, LCSW

## 2017-12-11 NOTE — Clinical Social Work Note (Signed)
CSW facilitated patient discharge including contacting patient family and facility to confirm patient discharge plans. Clinical information faxed to facility and family agreeable with plan. CSW arranged ambulance transport via [facility] to [facility]. RN has already called report to facility.  CSW will sign off for now as social work intervention is no longer needed. Please consult us again if new needs arise.  Charlynn CourtSarah Haylyn Halberg, CSW [phone]

## 2017-12-14 LAB — CULTURE, BLOOD (ROUTINE X 2)
CULTURE: NO GROWTH
CULTURE: NO GROWTH
Special Requests: ADEQUATE
Special Requests: ADEQUATE

## 2017-12-23 DIAGNOSIS — J45909 Unspecified asthma, uncomplicated: Secondary | ICD-10-CM | POA: Diagnosis not present

## 2017-12-23 DIAGNOSIS — K5900 Constipation, unspecified: Secondary | ICD-10-CM | POA: Diagnosis not present

## 2018-01-14 ENCOUNTER — Other Ambulatory Visit: Payer: Self-pay | Admitting: Gastroenterology

## 2018-01-14 DIAGNOSIS — R634 Abnormal weight loss: Secondary | ICD-10-CM | POA: Diagnosis not present

## 2018-01-14 DIAGNOSIS — R1084 Generalized abdominal pain: Secondary | ICD-10-CM | POA: Diagnosis not present

## 2018-01-14 DIAGNOSIS — K5900 Constipation, unspecified: Secondary | ICD-10-CM | POA: Diagnosis not present

## 2018-01-20 ENCOUNTER — Ambulatory Visit: Payer: Medicare HMO | Admitting: Podiatry

## 2018-01-28 ENCOUNTER — Ambulatory Visit
Admission: RE | Admit: 2018-01-28 | Discharge: 2018-01-28 | Disposition: A | Discharge status: Home / Self Care | Payer: Medicare HMO | Source: Ambulatory Visit | Attending: Gastroenterology | Admitting: Gastroenterology

## 2018-01-28 DIAGNOSIS — R1084 Generalized abdominal pain: Secondary | ICD-10-CM

## 2018-01-28 DIAGNOSIS — K5730 Diverticulosis of large intestine without perforation or abscess without bleeding: Secondary | ICD-10-CM | POA: Diagnosis not present

## 2018-01-28 DIAGNOSIS — K5900 Constipation, unspecified: Secondary | ICD-10-CM

## 2018-01-28 DIAGNOSIS — R634 Abnormal weight loss: Secondary | ICD-10-CM

## 2018-02-05 DIAGNOSIS — K5730 Diverticulosis of large intestine without perforation or abscess without bleeding: Secondary | ICD-10-CM | POA: Diagnosis not present

## 2018-02-05 DIAGNOSIS — K862 Cyst of pancreas: Secondary | ICD-10-CM | POA: Diagnosis not present

## 2018-03-16 DIAGNOSIS — I429 Cardiomyopathy, unspecified: Secondary | ICD-10-CM | POA: Diagnosis not present

## 2018-03-16 DIAGNOSIS — N183 Chronic kidney disease, stage 3 (moderate): Secondary | ICD-10-CM | POA: Diagnosis not present

## 2018-03-16 DIAGNOSIS — I5022 Chronic systolic (congestive) heart failure: Secondary | ICD-10-CM | POA: Diagnosis not present

## 2018-03-16 DIAGNOSIS — E1142 Type 2 diabetes mellitus with diabetic polyneuropathy: Secondary | ICD-10-CM | POA: Diagnosis not present

## 2018-03-16 DIAGNOSIS — F322 Major depressive disorder, single episode, severe without psychotic features: Secondary | ICD-10-CM | POA: Diagnosis not present

## 2018-03-16 DIAGNOSIS — R5383 Other fatigue: Secondary | ICD-10-CM | POA: Diagnosis not present

## 2018-03-16 DIAGNOSIS — Z0000 Encounter for general adult medical examination without abnormal findings: Secondary | ICD-10-CM | POA: Diagnosis not present

## 2018-03-16 DIAGNOSIS — I480 Paroxysmal atrial fibrillation: Secondary | ICD-10-CM | POA: Diagnosis not present

## 2018-03-16 DIAGNOSIS — I130 Hypertensive heart and chronic kidney disease with heart failure and stage 1 through stage 4 chronic kidney disease, or unspecified chronic kidney disease: Secondary | ICD-10-CM | POA: Diagnosis not present

## 2018-06-05 NOTE — Progress Notes (Signed)
Patient ID: [name], female   DOB: 06/09/1923, 84 y.o.   MRN: [id number]   84 y.o. frail anxious and depressed female. Previous fall with right hip surgery and using walker to ambulate. Previous DCM EF 20% and no CAD by cath 2009 with chronic LBBB EF returned to normal on medical Rx By last echo done 04/14/16 EF 60-65% Living at [facility] and has had insurance issues in regard to  Staying there and living independently Has been on Zoloft for her depression but this does not seem to help  Multiple somatic non cardiac complaints including ? UTI, poor BM's , arthritis   ROS: Denies fever, malais, weight loss, blurry vision, decreased visual acuity, cough, sputum, SOB, hemoptysis, pleuritic pain, palpitaitons, heartburn, abdominal pain, melena, lower extremity edema, claudication, or rash.  All other systems reviewed and negative  General: Depressed and anxious  Frail elderly female  HEENT: normal Neck supple with no adenopathy JVP normal no bruits no thyromegaly Lungs clear with no wheezing and good diaphragmatic motion Heart:  S1/S2 no murmur, no rub, gallop or click PMI normal Abdomen: benighn, BS positve, no tenderness, no AAA no bruit.  No HSM or HJR Distal pulses intact with no bruits No edema Neuro non-focal Skin warm and dry Needs walker to ambulate fall risk     Current Outpatient Medications  Medication Sig Dispense Refill  . acetaminophen (TYLENOL) 325 MG tablet Take 325 mg by mouth every 6 (six) hours as needed (pain).    Marland Kitchen aspirin 81 MG chewable tablet Chew 81 mg by mouth daily.     . carvedilol (COREG) 6.25 MG tablet Take 6.25 mg by mouth 2 (two) times daily.    . colestipol (COLESTID) 1 g tablet Take 2 tablets (2 g total) by mouth daily. Hold FOR CONSTIPATION 5 tablet 0  . furosemide (LASIX) 40 MG tablet Take 40 mg by mouth daily.    Marland Kitchen lactobacillus acidophilus & bulgar (LACTINEX) chewable tablet Chew 1 tablet by mouth daily.    Marland Kitchen loperamide (IMODIUM A-D)  2 MG tablet Take 2-4 mg by mouth See admin instructions. Take 2 tablets (4 mg) by mouth as needed after 1st loose stool followed by 1 tablet (2 mg) after each subsequent loose stool.    . magnesium hydroxide (MILK OF MAGNESIA) 400 MG/5ML suspension Take 15 mLs by mouth daily as needed (constipation).     . mirtazapine (REMERON) 30 MG tablet Take 30 mg by mouth as directed.    . Multiple Vitamins-Minerals (PRESERVISION AREDS PO) Take 1 tablet by mouth daily.     Marland Kitchen omeprazole (PRILOSEC) 20 MG capsule Take 20 mg by mouth daily.    . ondansetron (ZOFRAN) 4 MG tablet Take 4 mg by mouth daily as needed for nausea.     . sertraline (ZOLOFT) 100 MG tablet Take 100 mg by mouth daily.    . simvastatin (ZOCOR) 40 MG tablet Take 40 mg by mouth daily.    Marland Kitchen spironolactone (ALDACTONE) 25 MG tablet Take 0.5 tablets (12.5 mg total) by mouth daily. 45 tablet 1   No current facility-administered medications for this visit.     Allergies  Fosamax [alendronate sodium]; Penicillins; Sulfonamide derivatives; and Tetanus toxoid  Electrocardiogram:  09/08/17 SR LBBB PR 236   Assessment and Plan  DCM:  EF normal by echo 04/14/16  Continue beta blocker and diuretics No further w/u unless Symptoms change given age   PAF:  maint NSR  No papitations no anticoagulation due to falls and  advanced age   LBBB:  Stable chronic no high grade AV block  Depression: zoloft not effective f/u primary and psychiatry    Today she confirmed her wish to be DNR   F/U with me in 6 months   Charlton HawsPeter Colandra Ohanian

## 2018-06-12 ENCOUNTER — Encounter: Payer: Self-pay | Admitting: Cardiovascular Disease

## 2018-06-12 ENCOUNTER — Ambulatory Visit: Payer: Medicare HMO | Admitting: Cardiovascular Disease

## 2018-06-12 VITALS — BP 112/60 | HR 84 | Ht 70.0 in | Wt 207.0 lb

## 2018-06-12 DIAGNOSIS — I480 Paroxysmal atrial fibrillation: Secondary | ICD-10-CM | POA: Diagnosis not present

## 2018-06-12 DIAGNOSIS — I420 Dilated cardiomyopathy: Secondary | ICD-10-CM | POA: Diagnosis not present

## 2018-06-12 DIAGNOSIS — I447 Left bundle-branch block, unspecified: Secondary | ICD-10-CM | POA: Diagnosis not present

## 2018-06-12 NOTE — Patient Instructions (Signed)

## 2018-06-17 DIAGNOSIS — H353232 Exudative age-related macular degeneration, bilateral, with inactive choroidal neovascularization: Secondary | ICD-10-CM | POA: Diagnosis not present

## 2018-06-17 DIAGNOSIS — H4720 Unspecified optic atrophy: Secondary | ICD-10-CM | POA: Diagnosis not present

## 2018-06-17 DIAGNOSIS — E119 Type 2 diabetes mellitus without complications: Secondary | ICD-10-CM | POA: Diagnosis not present

## 2018-06-17 DIAGNOSIS — H353133 Nonexudative age-related macular degeneration, bilateral, advanced atrophic without subfoveal involvement: Secondary | ICD-10-CM | POA: Diagnosis not present

## 2018-06-25 DIAGNOSIS — H903 Sensorineural hearing loss, bilateral: Secondary | ICD-10-CM | POA: Diagnosis not present

## 2018-07-08 DIAGNOSIS — R300 Dysuria: Secondary | ICD-10-CM | POA: Diagnosis not present

## 2018-08-04 DIAGNOSIS — H353134 Nonexudative age-related macular degeneration, bilateral, advanced atrophic with subfoveal involvement: Secondary | ICD-10-CM | POA: Diagnosis not present

## 2018-08-04 DIAGNOSIS — E119 Type 2 diabetes mellitus without complications: Secondary | ICD-10-CM | POA: Diagnosis not present

## 2018-08-18 DIAGNOSIS — H4720 Unspecified optic atrophy: Secondary | ICD-10-CM | POA: Diagnosis not present

## 2018-08-18 DIAGNOSIS — E119 Type 2 diabetes mellitus without complications: Secondary | ICD-10-CM | POA: Diagnosis not present

## 2018-08-18 DIAGNOSIS — H353133 Nonexudative age-related macular degeneration, bilateral, advanced atrophic without subfoveal involvement: Secondary | ICD-10-CM | POA: Diagnosis not present

## 2018-08-18 DIAGNOSIS — H353232 Exudative age-related macular degeneration, bilateral, with inactive choroidal neovascularization: Secondary | ICD-10-CM | POA: Diagnosis not present

## 2018-09-16 DIAGNOSIS — M549 Dorsalgia, unspecified: Secondary | ICD-10-CM | POA: Diagnosis not present

## 2018-09-16 DIAGNOSIS — F322 Major depressive disorder, single episode, severe without psychotic features: Secondary | ICD-10-CM | POA: Diagnosis not present

## 2018-09-16 DIAGNOSIS — I130 Hypertensive heart and chronic kidney disease with heart failure and stage 1 through stage 4 chronic kidney disease, or unspecified chronic kidney disease: Secondary | ICD-10-CM | POA: Diagnosis not present

## 2018-09-16 DIAGNOSIS — E782 Mixed hyperlipidemia: Secondary | ICD-10-CM | POA: Diagnosis not present

## 2018-09-16 DIAGNOSIS — R5383 Other fatigue: Secondary | ICD-10-CM | POA: Diagnosis not present

## 2018-09-16 DIAGNOSIS — I480 Paroxysmal atrial fibrillation: Secondary | ICD-10-CM | POA: Diagnosis not present

## 2018-09-16 DIAGNOSIS — I5022 Chronic systolic (congestive) heart failure: Secondary | ICD-10-CM | POA: Diagnosis not present

## 2018-09-16 DIAGNOSIS — E1142 Type 2 diabetes mellitus with diabetic polyneuropathy: Secondary | ICD-10-CM | POA: Diagnosis not present

## 2018-09-16 DIAGNOSIS — K5900 Constipation, unspecified: Secondary | ICD-10-CM | POA: Diagnosis not present

## 2018-10-28 DIAGNOSIS — E039 Hypothyroidism, unspecified: Secondary | ICD-10-CM | POA: Diagnosis not present

## 2018-10-28 DIAGNOSIS — G4700 Insomnia, unspecified: Secondary | ICD-10-CM | POA: Diagnosis not present

## 2018-10-28 DIAGNOSIS — K5900 Constipation, unspecified: Secondary | ICD-10-CM | POA: Diagnosis not present

## 2018-10-28 DIAGNOSIS — F322 Major depressive disorder, single episode, severe without psychotic features: Secondary | ICD-10-CM | POA: Diagnosis not present

## 2018-12-07 NOTE — Progress Notes (Signed)
Patient ID: [name], female   DOB: [birth year], 85 y.o.   MRN: [id number]     85 y.o. frail anxious and depressed female. Previous fall with right hip surgery and using walker to ambulate. Previous DCM EF 20% and no CAD by cath 2009 with chronic LBBB EF returned to normal on medical Rx By last echo done 04/14/16 EF 60-65% Living at [facility] and has had insurance issues in regard to  Staying there and living independently Has been on Zoloft for her depression but this does not seem to help  Multiple somatic non cardiac complaints including ? UTI, poor BM's , arthritis  She continues to be depressed and needs PT to stay ambulatory  ROS: Denies fever, malais, weight loss, blurry vision, decreased visual acuity, cough, sputum, SOB, hemoptysis, pleuritic pain, palpitaitons, heartburn, abdominal pain, melena, lower extremity edema, claudication, or rash.  All other systems reviewed and negative  General: Depressed and anxious  Frail elderly female  HEENT: normal Neck supple with no adenopathy JVP normal no bruits no thyromegaly Lungs clear with no wheezing and good diaphragmatic motion Heart:  S1/S2 no murmur, no rub, gallop or click PMI normal Abdomen: benighn, BS positve, no tenderness, no AAA no bruit.  No HSM or HJR Distal pulses intact with no bruits No edema Neuro non-focal Skin warm and dry Needs walker to ambulate fall risk     Current Outpatient Medications  Medication Sig Dispense Refill  . acetaminophen (TYLENOL) 325 MG tablet Take 325 mg by mouth every 6 (six) hours as needed (pain).    Marland Kitchen aspirin 81 MG chewable tablet Chew 81 mg by mouth daily.     . carvedilol (COREG) 6.25 MG tablet Take 6.25 mg by mouth 2 (two) times daily.    . colestipol (COLESTID) 1 g tablet Take 2 tablets (2 g total) by mouth daily. Hold FOR CONSTIPATION 5 tablet 0  . diazepam (VALIUM) 5 MG tablet Take by mouth. TAKE 1/2 TABLET IN A.M, TAKE 1 TABLET QHS    . lactobacillus acidophilus &  bulgar (LACTINEX) chewable tablet Chew 1 tablet by mouth daily.    Marland Kitchen levothyroxine (SYNTHROID, LEVOTHROID) 25 MCG tablet Take 25 mcg by mouth daily before breakfast.    . loperamide (IMODIUM A-D) 2 MG tablet Take 2-4 mg by mouth See admin instructions. Take 2 tablets (4 mg) by mouth as needed after 1st loose stool followed by 1 tablet (2 mg) after each subsequent loose stool.    . magnesium hydroxide (MILK OF MAGNESIA) 400 MG/5ML suspension Take 15 mLs by mouth daily as needed (constipation).     . mirtazapine (REMERON) 30 MG tablet Take 30 mg by mouth as directed.    . Multiple Vitamins-Minerals (PRESERVISION AREDS PO) Take 1 tablet by mouth daily.     Marland Kitchen omeprazole (PRILOSEC) 20 MG capsule Take 20 mg by mouth daily.    . ondansetron (ZOFRAN) 4 MG tablet Take 4 mg by mouth daily as needed for nausea.     . sertraline (ZOLOFT) 100 MG tablet Take 100 mg by mouth daily.    . simvastatin (ZOCOR) 40 MG tablet Take 40 mg by mouth daily.    Marland Kitchen spironolactone (ALDACTONE) 25 MG tablet Take 0.5 tablets (12.5 mg total) by mouth daily. 45 tablet 1  . torsemide (DEMADEX) 20 MG tablet Take 20 mg by mouth daily.     No current facility-administered medications for this visit.     Allergies  Fosamax [alendronate sodium]; Penicillins; Sulfonamide derivatives; and  Tetanus toxoid  Electrocardiogram:  09/08/17 SR LBBB PR 236  12/09/18 SR Rate 74 PR 252  lBBB no changes   Assessment and Plan  DCM:  EF normal by echo 04/14/16  Continue beta blocker and diuretics No further w/u unless Symptoms change given age   PAF:  maint NSR  No papitations no anticoagulation due to falls and advanced age   LBBB:  Stable chronic no high grade AV block  Depression: zoloft not effective f/u primary and psychiatry    Today she confirmed her wish to be DNR   F/U with me in 6 months   [name]

## 2018-12-09 ENCOUNTER — Ambulatory Visit: Payer: Medicare HMO | Admitting: Cardiovascular Disease

## 2018-12-09 ENCOUNTER — Encounter: Payer: Self-pay | Admitting: Cardiovascular Disease

## 2018-12-09 VITALS — BP 138/72 | HR 74 | Ht 70.0 in | Wt 218.4 lb

## 2018-12-09 DIAGNOSIS — I420 Dilated cardiomyopathy: Secondary | ICD-10-CM | POA: Diagnosis not present

## 2018-12-09 DIAGNOSIS — I447 Left bundle-branch block, unspecified: Secondary | ICD-10-CM

## 2018-12-09 DIAGNOSIS — I480 Paroxysmal atrial fibrillation: Secondary | ICD-10-CM | POA: Diagnosis not present

## 2018-12-09 NOTE — Patient Instructions (Signed)

## 2018-12-10 NOTE — Addendum Note (Signed)
Addended by: Burnetta Sabin on: 12/10/2018 03:50 PM   Modules accepted: Orders

## 2019-01-06 DIAGNOSIS — H353232 Exudative age-related macular degeneration, bilateral, with inactive choroidal neovascularization: Secondary | ICD-10-CM | POA: Diagnosis not present

## 2019-01-06 DIAGNOSIS — H353133 Nonexudative age-related macular degeneration, bilateral, advanced atrophic without subfoveal involvement: Secondary | ICD-10-CM | POA: Diagnosis not present

## 2019-01-06 DIAGNOSIS — E119 Type 2 diabetes mellitus without complications: Secondary | ICD-10-CM | POA: Diagnosis not present

## 2019-01-06 DIAGNOSIS — H4720 Unspecified optic atrophy: Secondary | ICD-10-CM | POA: Diagnosis not present

## 2019-03-23 DIAGNOSIS — N183 Chronic kidney disease, stage 3 (moderate): Secondary | ICD-10-CM | POA: Diagnosis not present

## 2019-03-23 DIAGNOSIS — F322 Major depressive disorder, single episode, severe without psychotic features: Secondary | ICD-10-CM | POA: Diagnosis not present

## 2019-03-23 DIAGNOSIS — I5022 Chronic systolic (congestive) heart failure: Secondary | ICD-10-CM | POA: Diagnosis not present

## 2019-03-23 DIAGNOSIS — I130 Hypertensive heart and chronic kidney disease with heart failure and stage 1 through stage 4 chronic kidney disease, or unspecified chronic kidney disease: Secondary | ICD-10-CM | POA: Diagnosis not present

## 2019-03-23 DIAGNOSIS — I429 Cardiomyopathy, unspecified: Secondary | ICD-10-CM | POA: Diagnosis not present

## 2019-03-23 DIAGNOSIS — F411 Generalized anxiety disorder: Secondary | ICD-10-CM | POA: Diagnosis not present

## 2019-03-23 DIAGNOSIS — Z0000 Encounter for general adult medical examination without abnormal findings: Secondary | ICD-10-CM | POA: Diagnosis not present

## 2019-03-23 DIAGNOSIS — I480 Paroxysmal atrial fibrillation: Secondary | ICD-10-CM | POA: Diagnosis not present

## 2019-03-23 DIAGNOSIS — E1142 Type 2 diabetes mellitus with diabetic polyneuropathy: Secondary | ICD-10-CM | POA: Diagnosis not present

## 2019-07-02 ENCOUNTER — Other Ambulatory Visit: Payer: Self-pay

## 2019-07-02 ENCOUNTER — Encounter (HOSPITAL_COMMUNITY): Payer: Self-pay

## 2019-07-02 ENCOUNTER — Emergency Department (HOSPITAL_COMMUNITY): Payer: Medicare HMO

## 2019-07-02 ENCOUNTER — Emergency Department (HOSPITAL_COMMUNITY)
Admission: EM | Admit: 2019-07-02 | Discharge: 2019-07-03 | Disposition: A | Discharge status: Home / Self Care | Payer: Medicare HMO | Attending: Emergency Medicine | Admitting: Emergency Medicine

## 2019-07-02 DIAGNOSIS — Z79899 Other long term (current) drug therapy: Secondary | ICD-10-CM | POA: Insufficient documentation

## 2019-07-02 DIAGNOSIS — R0600 Dyspnea, unspecified: Secondary | ICD-10-CM | POA: Diagnosis present

## 2019-07-02 DIAGNOSIS — E1140 Type 2 diabetes mellitus with diabetic neuropathy, unspecified: Secondary | ICD-10-CM | POA: Diagnosis not present

## 2019-07-02 DIAGNOSIS — R0602 Shortness of breath: Secondary | ICD-10-CM | POA: Diagnosis not present

## 2019-07-02 DIAGNOSIS — R0789 Other chest pain: Secondary | ICD-10-CM | POA: Diagnosis not present

## 2019-07-02 DIAGNOSIS — Z7722 Contact with and (suspected) exposure to environmental tobacco smoke (acute) (chronic): Secondary | ICD-10-CM | POA: Insufficient documentation

## 2019-07-02 DIAGNOSIS — R0902 Hypoxemia: Secondary | ICD-10-CM | POA: Diagnosis not present

## 2019-07-02 DIAGNOSIS — Z7982 Long term (current) use of aspirin: Secondary | ICD-10-CM | POA: Insufficient documentation

## 2019-07-02 DIAGNOSIS — I110 Hypertensive heart disease with heart failure: Secondary | ICD-10-CM | POA: Insufficient documentation

## 2019-07-02 DIAGNOSIS — Z20828 Contact with and (suspected) exposure to other viral communicable diseases: Secondary | ICD-10-CM | POA: Diagnosis not present

## 2019-07-02 DIAGNOSIS — I4430 Unspecified atrioventricular block: Secondary | ICD-10-CM | POA: Diagnosis not present

## 2019-07-02 DIAGNOSIS — J9801 Acute bronchospasm: Secondary | ICD-10-CM | POA: Diagnosis not present

## 2019-07-02 DIAGNOSIS — I10 Essential (primary) hypertension: Secondary | ICD-10-CM | POA: Diagnosis not present

## 2019-07-02 DIAGNOSIS — I5042 Chronic combined systolic (congestive) and diastolic (congestive) heart failure: Secondary | ICD-10-CM | POA: Diagnosis not present

## 2019-07-02 LAB — COMPREHENSIVE METABOLIC PANEL
ALT: 18 U/L (ref 0–44)
AST: 23 U/L (ref 15–41)
Albumin: 3.7 g/dL (ref 3.5–5.0)
Alkaline Phosphatase: 73 U/L (ref 38–126)
Anion gap: 13 (ref 5–15)
BUN: 22 mg/dL (ref 8–23)
CO2: 28 mmol/L (ref 22–32)
Calcium: 9 mg/dL (ref 8.9–10.3)
Chloride: 101 mmol/L (ref 98–111)
Creatinine, Ser: 1.41 mg/dL — ABNORMAL HIGH (ref 0.44–1.00)
GFR calc Af Amer: 37 mL/min — ABNORMAL LOW (ref >60)
GFR calc non Af Amer: 32 mL/min — ABNORMAL LOW (ref >60)
Glucose, Bld: 174 mg/dL — ABNORMAL HIGH (ref 70–99)
Potassium: 3.7 mmol/L (ref 3.5–5.1)
Sodium: 142 mmol/L (ref 135–145)
Total Bilirubin: 0.5 mg/dL (ref 0.3–1.2)
Total Protein: 7.3 g/dL (ref 6.5–8.1)

## 2019-07-02 LAB — CBC WITH DIFFERENTIAL/PLATELET
Abs Immature Granulocytes: 0.1 10*3/uL — ABNORMAL HIGH (ref 0.00–0.07)
Basophils Absolute: 0.1 10*3/uL (ref 0.0–0.1)
Basophils Relative: 1 %
Eosinophils Absolute: 0.3 10*3/uL (ref 0.0–0.5)
Eosinophils Relative: 4 %
HCT: 40.2 % (ref 36.0–46.0)
Hemoglobin: 13.2 g/dL (ref 12.0–15.0)
Immature Granulocytes: 1 %
Lymphocytes Relative: 16 %
Lymphs Abs: 1.3 10*3/uL (ref 0.7–4.0)
MCH: 30.4 pg (ref 26.0–34.0)
MCHC: 32.8 g/dL (ref 30.0–36.0)
MCV: 92.6 fL (ref 80.0–100.0)
Monocytes Absolute: 0.6 10*3/uL (ref 0.1–1.0)
Monocytes Relative: 7 %
Neutro Abs: 6.1 10*3/uL (ref 1.7–7.7)
Neutrophils Relative %: 71 %
Platelets: 151 10*3/uL (ref 150–400)
RBC: 4.34 MIL/uL (ref 3.87–5.11)
RDW: 13.8 % (ref 11.5–15.5)
WBC: 8.5 10*3/uL (ref 4.0–10.5)
nRBC: 0 % (ref 0.0–0.2)

## 2019-07-02 LAB — SARS CORONAVIRUS 2 BY RT PCR (HOSPITAL ORDER, PERFORMED IN ~~LOC~~ HOSPITAL LAB): SARS Coronavirus 2: NEGATIVE

## 2019-07-02 LAB — TROPONIN I (HIGH SENSITIVITY)
Troponin I (High Sensitivity): 6 ng/L (ref <18)
Troponin I (High Sensitivity): 7 ng/L (ref <18)

## 2019-07-02 MED ORDER — ALBUTEROL SULFATE HFA 108 (90 BASE) MCG/ACT IN AERS
1.0000 | INHALATION_SPRAY | RESPIRATORY_TRACT | Status: DC | PRN
  Administered 2019-07-02: 19:00:00 2 via RESPIRATORY_TRACT
  Filled 2019-07-02: qty 6.7

## 2019-07-02 NOTE — ED Provider Notes (Signed)
[facility] DEPT Provider Note   CSN: [id number] Arrival date & time: 07/02/19  1747    History   Chief Complaint No chief complaint on file.   HPI [name] is a 85 y.o. female.     HPI Patient woke from her nap this afternoon with burning sensation in her chest.  This is since resolved.  She has developed mild dyspnea and fatigue while getting ready to take shower.  Evaluated by nursing home staff and advised to come to the emergency department.  Patient states she has had cough which is nonproductive.  No fever or chills.  Denies abdominal pain or chest pain currently. Past Medical History:  Diagnosis Date   Atrial fibrillation ([place])    Cholelithiasis    Constipation    Diabetes mellitus    Dizziness    with some gait disability   GERD (gastroesophageal reflux disease)    Hepatic cyst    Hyperlipidemia    Hypertension    Ischemic cardiomyopathy    LBBB (left bundle branch block)    Macular degeneration    Other postprocedural status(V45.89)    Peripheral edema     Patient Active Problem List   Diagnosis Date Noted   Reactive airway disease 12/09/2017   Anxiety and depression 12/09/2017   Acute respiratory failure ([place]) 12/09/2017   Normocytic anemia 04/13/2016   Acute on chronic combined systolic and diastolic CHF (congestive heart failure) ([place]) 04/13/2016   Pyelonephritis 04/12/2016   Chronic combined systolic and diastolic congestive heart failure ([place]) 11/14/2013   Constipation 11/14/2013   Nausea 11/14/2013   Influenza with respiratory manifestations 11/09/2013   AKI (acute kidney injury) ([place]) 11/09/2013   Influenza 11/07/2013   Acute on chronic combined systolic and diastolic congestive heart failure ([place]) 11/07/2013   Acute respiratory failure with hypoxia ([place]) 11/07/2013   Fever 11/07/2013   Generalized weakness 11/07/2013   Peripheral neuropathy 05/05/2012   Closed left hip  fracture ([place]) 33/35/4562   Chronic systolic HF (heart failure) ([place]) 05/04/2012   Arthritis 07/23/2011   CHF (congestive heart failure) ([place]) 07/23/2011   DYSPNEA 07/31/2010   DEPRESSIVE DISORDER NOT ELSEWHERE CLASSIFIED 02/08/2009   Type 2 diabetes mellitus with diabetic neuropathy, without long-term current use of insulin ([place]) 11/08/2008   HYPERLIPIDEMIA-MIXED 11/08/2008   Essential hypertension 11/08/2008   LBBB (left bundle branch block) 11/08/2008   Atrial fibrillation ([place]) 11/08/2008   GERD 11/08/2008   HEPATIC CYST 11/08/2008   CHOLELITHIASIS 11/08/2008   DILATION AND CURETTAGE, HX OF 11/08/2008    Past Surgical History:  Procedure Laterality Date   ANKLE SURGERY Bilateral    fractures   CHOLECYSTECTOMY     Dilation and curettage     HIP ARTHROPLASTY  05/05/2012   Procedure: ARTHROPLASTY BIPOLAR HIP;  Surgeon: Mauri Pole, MD;  Location: [facility];  Service: Orthopedics;  Laterality: Left;   TONSILLECTOMY     WRIST SURGERY Bilateral    wrist fractures     OB History   No obstetric history on file.      Home Medications    Prior to Admission medications   Medication Sig Start Date End Date Taking? Authorizing Provider  acetaminophen (TYLENOL) 325 MG tablet Take 325 mg by mouth every 6 (six) hours as needed (pain).   Yes [provider]  acetaminophen (TYLENOL) 500 MG tablet Take 500 mg by mouth every evening.   Yes [provider]  aspirin 81 MG chewable tablet Chew 81 mg by mouth  daily.    Yes [provider]  benzonatate (TESSALON) 100 MG capsule Take 100 mg by mouth 3 (three) times daily as needed for cough.   Yes [provider]  carvedilol (COREG) 6.25 MG tablet Take 6.25 mg by mouth 2 (two) times daily. 09/03/17  Yes [provider]  diazepam (VALIUM) 5 MG tablet Take 2.5-5 mg by mouth See admin instructions. Take 2.5mg  by mouth in the morning and take 5mg  by mouth before bedtime   Yes  [provider]  guaifenesin (ROBITUSSIN) 100 MG/5ML syrup Take 200 mg by mouth every 8 (eight) hours as needed for cough.   Yes [provider]  lactobacillus acidophilus & bulgar (LACTINEX) chewable tablet Chew 1 tablet by mouth daily.   Yes [provider]  levothyroxine (SYNTHROID, LEVOTHROID) 25 MCG tablet Take 25 mcg by mouth daily before breakfast.   Yes [provider]  loperamide (IMODIUM A-D) 2 MG tablet Take 2-4 mg by mouth See admin instructions. Take 2 tablets (4 mg) by mouth as needed after 1st loose stool followed by 1 tablet (2 mg) after each subsequent loose stool.   Yes [provider]  magnesium hydroxide (MILK OF MAGNESIA) 400 MG/5ML suspension Take 15 mLs by mouth daily as needed (constipation).    Yes [provider]  mirtazapine (REMERON) 30 MG tablet Take 30 mg by mouth at bedtime.    Yes [provider]  Multiple Vitamins-Minerals (PRESERVISION AREDS PO) Take 1 tablet by mouth daily.    Yes [provider]  omeprazole (PRILOSEC) 20 MG capsule Take 20 mg by mouth daily.   Yes [provider]  ondansetron (ZOFRAN) 4 MG tablet Take 4 mg by mouth daily as needed for nausea.  06/09/17  Yes [provider]  sertraline (ZOLOFT) 100 MG tablet Take 100 mg by mouth daily. 08/25/16  Yes [provider]  simvastatin (ZOCOR) 40 MG tablet Take 40 mg by mouth daily.   Yes [provider]  spironolactone (ALDACTONE) 25 MG tablet Take 0.5 tablets (12.5 mg total) by mouth daily. 02/21/17  Yes Wendall StadeNishan, Peter C, MD  torsemide (DEMADEX) 20 MG tablet Take 20 mg by mouth daily.   Yes [provider]  colestipol (COLESTID) 1 g tablet Take 2 tablets (2 g total) by mouth daily. 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 Negative for chills and fever.  HENT: Negative for sore throat and trouble swallowing.   Respiratory: Positive for cough, shortness of breath and wheezing.   Cardiovascular: Positive for chest pain.  Gastrointestinal: Negative for abdominal pain, constipation, diarrhea, nausea and vomiting.  Genitourinary: Negative for dysuria, flank pain and frequency.  Musculoskeletal: Negative for back pain, myalgias, neck pain and neck  stiffness.  Skin: Negative for rash and wound.  Neurological: Negative for dizziness, weakness, light-headedness, numbness and headaches.  All other systems reviewed and are negative.    Physical Exam Updated Vital Signs BP (!) 175/83    Pulse 70    Temp 98.9 F (37.2 C) (Oral)    Resp 19    Ht 5\' 10"  (1.778 m)    Wt 99.8 kg    SpO2 96%    BMI 31.57 kg/m   Physical Exam Vitals signs and nursing note reviewed.  Constitutional:      General: She is not in acute distress.    Appearance: Normal appearance. She is well-developed. She is not ill-appearing.  HENT:     Head: Normocephalic and atraumatic.     Nose: Nose normal.     Mouth/Throat:     Mouth: Mucous membranes are moist.  Eyes:     Pupils: Pupils are equal, round, and reactive to light.  Neck:     Musculoskeletal: Normal range of motion and neck supple.  Cardiovascular:     Rate and Rhythm: Normal rate and regular rhythm.     Heart sounds: No murmur. No friction rub. No gallop.   Pulmonary:       Effort: Pulmonary effort is normal.     Breath sounds: Wheezing present.     Comments: Expiratory wheezes throughout. Abdominal:     General: Bowel sounds are normal. There is no distension.     Palpations: Abdomen is soft. There is no mass.     Tenderness: There is no abdominal tenderness. There is no right CVA tenderness, left CVA tenderness, guarding or rebound.     Hernia: No hernia is present.  Musculoskeletal: Normal range of motion.        General: No swelling, tenderness, deformity or signs of injury.     Right lower leg: No edema.     Left lower leg: No edema.  Skin:    General: Skin is warm and dry.     Capillary Refill: Capillary refill takes less than 2 seconds.     Findings: No erythema or rash.  Neurological:     General: No focal deficit present.     Mental Status: She is alert and oriented to person, place, and time.  Psychiatric:        Behavior: Behavior normal.      ED Treatments / Results  Labs (all labs ordered are listed, but only abnormal results are displayed) Labs Reviewed  CBC WITH DIFFERENTIAL/PLATELET - Abnormal; Notable for the following components:      Result Value   Abs Immature Granulocytes 0.10 (*)    All other components within normal limits  COMPREHENSIVE METABOLIC PANEL - Abnormal; Notable for the following components:   Glucose, Bld 174 (*)    Creatinine, Ser 1.41 (*)    GFR calc non Af Amer 32 (*)    GFR calc Af Amer 37 (*)    All other components within normal limits  SARS CORONAVIRUS 2 (HOSPITAL ORDER, [facility]FOR[facility])  TROPONIN I (HIGH SENSITIVITY)  TROPONIN I (HIGH SENSITIVITY)    EKG EKG Interpretation  Date/Time:  Friday July 02 2019 19:12:18 EDT Ventricular Rate:  71 PR Interval:    QRS Duration: 160 QT Interval:  498 QTC Calculation: 542 R Axis:   -42 Text Interpretation:  Sinus rhythm Atrial premature complexes Prolonged PR interval Left bundle branch block Confirmed by Loren RacerYelverton, Haward Pope  ([id number]) on 07/02/2019 10:37:30 PM  Radiology Dg Chest Port 1 View  Result Date: 07/02/2019 CLINICAL DATA:  Shortness of breath. Awoke with burning in throat and chest. EXAM: PORTABLE CHEST 1 VIEW COMPARISON:  Chest radiograph and CT 12/09/2017 FINDINGS: The cardiomediastinal contours are unchanged with aortic atherosclerosis. The heart is normal in size. Pulmonary vasculature is normal. No confluent airspace disease. Possible blunting of the costophrenic angles, partially obscured on the right due to overlying monitoring devices. No pneumothorax. No acute osseous abnormalities are seen. IMPRESSION: 1. Possible small pleural effusions. No pulmonary edema or focal airspace disease. 2.  Aortic Atherosclerosis (ICD10-I70.0). Electronically Signed   By: Narda RutherfordMelanie  Sanford M.D.   On: 07/02/2019 19:10    Procedures Procedures (including critical care time)  Medications Ordered in ED Medications  albuterol (VENTOLIN HFA) 108 (90 Base) MCG/ACT inhaler 1-2 puff (2 puffs Inhalation Given 07/02/19 1908)     Initial Impression / Assessment and Plan / ED Course  I have reviewed the triage vital signs and the nursing notes.  Pertinent labs & imaging results that were available during my care of the patient were reviewed by me and considered in my medical decision making (see chart for details).       Wheezing has significantly improved after use of inhaler.  Troponin x2 is normal.  Suspect patient likely had acid reflux and possible aspiration with bronchospasm responsible for her symptoms.  We will send her home with the inhaler.  Instructed to sleep with several pillows and keep her head elevated.  Advised to follow-up closely with her primary physician.  Return precautions given.   Final Clinical Impressions(s) / ED Diagnoses   Final diagnoses:  Bronchospasm  Atypical chest pain    ED Discharge Orders    None       Loren RacerYelverton, Lukasz Rogus, MD 07/02/19 2237

## 2019-07-02 NOTE — ED Notes (Signed)
[facility] called at this time for pt transport to [facility].

## 2019-07-02 NOTE — Discharge Instructions (Addendum)
Use inhaler as needed for wheezing.  Follow-up with your primary physician.

## 2019-07-02 NOTE — ED Triage Notes (Signed)
Per EMS: Pt from [facility].  Pt woke up from her afternoon nap with burning in her throat and chest.  Pt hx of GERD, and she ate a hot dog with chili and slaw right before her nap.  Pt states her symptoms are now resolved.  Vitals WNL

## 2019-07-02 NOTE — ED Notes (Signed)
X ray in room.

## 2019-07-03 DIAGNOSIS — R279 Unspecified lack of coordination: Secondary | ICD-10-CM | POA: Diagnosis not present

## 2019-07-03 DIAGNOSIS — Z743 Need for continuous supervision: Secondary | ICD-10-CM | POA: Diagnosis not present

## 2019-07-03 NOTE — ED Notes (Signed)
Pt dressed and currently being picked up for transport by [facility]. Pt did self administer two puffs of inhaler at time of discharge after getting "winded" while getting dressed.

## 2019-09-02 ENCOUNTER — Ambulatory Visit: Payer: Medicare HMO | Admitting: Cardiovascular Disease

## 2019-09-06 DIAGNOSIS — M79675 Pain in left toe(s): Secondary | ICD-10-CM | POA: Diagnosis not present

## 2019-09-06 DIAGNOSIS — M79674 Pain in right toe(s): Secondary | ICD-10-CM | POA: Diagnosis not present

## 2019-09-06 DIAGNOSIS — B351 Tinea unguium: Secondary | ICD-10-CM | POA: Diagnosis not present

## 2019-09-21 DIAGNOSIS — R8290 Unspecified abnormal findings in urine: Secondary | ICD-10-CM | POA: Diagnosis not present

## 2019-09-21 DIAGNOSIS — E1142 Type 2 diabetes mellitus with diabetic polyneuropathy: Secondary | ICD-10-CM | POA: Diagnosis not present

## 2019-09-21 DIAGNOSIS — E782 Mixed hyperlipidemia: Secondary | ICD-10-CM | POA: Diagnosis not present

## 2019-09-21 DIAGNOSIS — E039 Hypothyroidism, unspecified: Secondary | ICD-10-CM | POA: Diagnosis not present

## 2019-09-21 DIAGNOSIS — N183 Chronic kidney disease, stage 3 unspecified: Secondary | ICD-10-CM | POA: Diagnosis not present

## 2019-09-21 DIAGNOSIS — E119 Type 2 diabetes mellitus without complications: Secondary | ICD-10-CM | POA: Diagnosis not present

## 2019-09-24 DIAGNOSIS — F322 Major depressive disorder, single episode, severe without psychotic features: Secondary | ICD-10-CM | POA: Diagnosis not present

## 2019-09-24 DIAGNOSIS — R296 Repeated falls: Secondary | ICD-10-CM | POA: Diagnosis not present

## 2019-09-24 DIAGNOSIS — N183 Chronic kidney disease, stage 3 unspecified: Secondary | ICD-10-CM | POA: Diagnosis not present

## 2019-09-24 DIAGNOSIS — I130 Hypertensive heart and chronic kidney disease with heart failure and stage 1 through stage 4 chronic kidney disease, or unspecified chronic kidney disease: Secondary | ICD-10-CM | POA: Diagnosis not present

## 2019-09-24 DIAGNOSIS — E782 Mixed hyperlipidemia: Secondary | ICD-10-CM | POA: Diagnosis not present

## 2019-09-24 DIAGNOSIS — E039 Hypothyroidism, unspecified: Secondary | ICD-10-CM | POA: Diagnosis not present

## 2019-09-24 DIAGNOSIS — I480 Paroxysmal atrial fibrillation: Secondary | ICD-10-CM | POA: Diagnosis not present

## 2019-09-24 DIAGNOSIS — E1142 Type 2 diabetes mellitus with diabetic polyneuropathy: Secondary | ICD-10-CM | POA: Diagnosis not present

## 2019-09-24 DIAGNOSIS — I5022 Chronic systolic (congestive) heart failure: Secondary | ICD-10-CM | POA: Diagnosis not present

## 2019-10-07 ENCOUNTER — Telehealth: Payer: Self-pay | Admitting: Hospice

## 2019-10-07 NOTE — Telephone Encounter (Signed)
Spoke with patient's niece Charlena Cross and explained where I was calling from and that I was calling to schedule an appointment time for a Palliative Consut  and she stated that patient was living at [facility] in the [facility] instead of the [facility] and she also requested that we call the patient to schedule this.  I have notified the NP that patient was in [facility] living so that she could schedule the Consult.

## 2019-10-29 ENCOUNTER — Other Ambulatory Visit: Payer: Self-pay

## 2019-10-29 ENCOUNTER — Non-Acute Institutional Stay: Payer: Medicare HMO | Admitting: Hospice

## 2019-10-29 DIAGNOSIS — Z515 Encounter for palliative care: Secondary | ICD-10-CM | POA: Diagnosis not present

## 2019-10-29 DIAGNOSIS — I509 Heart failure, unspecified: Secondary | ICD-10-CM

## 2019-10-29 NOTE — Progress Notes (Signed)
Therapist, nutritional Palliative Care Consult Note Telephone: [phone]  Fax: [phone]  PATIENT NAME: [name] DOB: [birth year] MRN: [id number]  PRIMARY CARE PROVIDER:   [name], MD  REFERRING PROVIDER:  [name], MD [address] [place],  [place] [address]  RESPONSIBLE PARTY:   Self Contact: [place] [name] - Niece. POA [phone]  TELEHEALTH VISIT STATEMENT Due to the COVID-19 crisis, this visit was done via telemedicine from my office. It was initiated and consented to by this patient and/or family.  RECOMMENDATIONS/PLAN:   Advance Care Planning/Goals of Care: Telehealth Visit consisted of building trust and discussions on Palliative Medicine as specialized medical care for people living with serious illness, aimed at facilitating better quality of life through symptoms relief, assisting with advance care plan and establishing goals of care. Goals of care include to maximize quality of life and symptom management. Updated [name] on visit. She affirmed patient is a DNR. A copy of DNR signed and uploaded to Epic today.  Symptom management: Bilateral lower edema r/t CHF improved. Continue ongoing treatment. Patient denied pain/discomfort, no shortness of breath or cough. She endorsed weakness but says she still gets around okay. Nursing staff with no complaints/concerns. Encouraged ongoing supportive care.  Follow up: Palliative care will continue to follow patient for goals of care clarification and symptom management. I spent 10.30am to 11.30am minutes providing this consultation, from  More than 50% of the time in this consultation was spent on coordinating communication HISTORY OF PRESENT ILLNESS:  [name] is a 85 y.o. year old female with multiple medical problems including HTN Afib, CHF, GERD. Palliative Care was asked to help address goals of care.   CODE STATUS: DNR  PPS: 50% HOSPICE  ELIGIBILITY/DIAGNOSIS: TBD  PAST MEDICAL HISTORY:  Past Medical History:  Diagnosis Date  . Atrial fibrillation (HCC)   . Cholelithiasis   . Constipation   . Diabetes mellitus   . Dizziness    with some gait disability  . GERD (gastroesophageal reflux disease)   . Hepatic cyst   . Hyperlipidemia   . Hypertension   . Ischemic cardiomyopathy   . LBBB (left bundle branch block)   . Macular degeneration   . Other postprocedural status(V45.89)   . Peripheral edema     SOCIAL HX:  Social History   Tobacco Use  . Smoking status: Passive Smoke Exposure - Never Smoker  . Smokeless tobacco: Never Used  . 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 Alcohol use: No    ALLERGIES:  Allergies  Allergen Reactions  . Fosamax [Alendronate Sodium] Swelling  . Penicillins Swelling and Other (See Comments)    Swelling, redness and warmth at injection site Has patient had a PCN reaction causing immediate rash, facial/tongue/throat swelling, SOB or lightheadedness with hypotension: No Has patient had a PCN reaction causing severe rash involving mucus membranes or skin necrosis: No Has patient had a PCN reaction that required hospitalization: No Has patient had a PCN reaction occurring within the last 10 years: No If all of the above answers are "NO", then may proceed with Cephalosporin use.  . Sulfonamide Derivatives Swelling  . Tetanus Toxoid Swelling     PERTINENT MEDICATIONS:  Outpatient Encounter Medications as of 10/29/2019  Medication Sig  . acetaminophen (TYLENOL) 325 MG tablet Take 325 mg by mouth every 6 (six) hours as needed (pain).  Marland Kitchen acetaminophen (TYLENOL) 500 MG tablet Take  500 mg by mouth every evening.  Marland Kitchen aspirin 81 MG chewable tablet Chew 81 mg by mouth daily.   . benzonatate (TESSALON) 100 MG capsule Take 100 mg by mouth 3 (three) times daily as needed for cough.  . carvedilol (COREG) 6.25 MG tablet Take 6.25 mg by mouth  2 (two) times daily.  . colestipol (COLESTID) 1 g tablet Take 2 tablets (2 g total) by mouth daily. Hold FOR CONSTIPATION (Patient not taking: Reported on 07/02/2019)  . diazepam (VALIUM) 5 MG tablet Take 2.5-5 mg by mouth See admin instructions. Take 2.5mg  by mouth in the morning and take 5mg  by mouth before bedtime  . guaifenesin (ROBITUSSIN) 100 MG/5ML syrup Take 200 mg by mouth every 8 (eight) hours as needed for cough.  . lactobacillus acidophilus & bulgar (LACTINEX) chewable tablet Chew 1 tablet by mouth daily.  Marland Kitchen levothyroxine (SYNTHROID, LEVOTHROID) 25 MCG tablet Take 25 mcg by mouth daily before breakfast.  . loperamide (IMODIUM A-D) 2 MG tablet Take 2-4 mg by mouth See admin instructions. Take 2 tablets (4 mg) by mouth as needed after 1st loose stool followed by 1 tablet (2 mg) after each subsequent loose stool.  . magnesium hydroxide (MILK OF MAGNESIA) 400 MG/5ML suspension Take 15 mLs by mouth daily as needed (constipation).   . mirtazapine (REMERON) 30 MG tablet Take 30 mg by mouth at bedtime.   . Multiple Vitamins-Minerals (PRESERVISION AREDS PO) Take 1 tablet by mouth daily.   Marland Kitchen omeprazole (PRILOSEC) 20 MG capsule Take 20 mg by mouth daily.  . ondansetron (ZOFRAN) 4 MG tablet Take 4 mg by mouth daily as needed for nausea.   . sertraline (ZOLOFT) 100 MG tablet Take 100 mg by mouth daily.  . simvastatin (ZOCOR) 40 MG tablet Take 40 mg by mouth daily.  Marland Kitchen spironolactone (ALDACTONE) 25 MG tablet Take 0.5 tablets (12.5 mg total) by mouth daily.  Marland Kitchen torsemide (DEMADEX) 20 MG tablet Take 20 mg by mouth daily.   No facility-administered encounter medications on file as of 10/29/2019.    Teodoro Spray, NP

## 2019-11-03 DIAGNOSIS — Z1159 Encounter for screening for other viral diseases: Secondary | ICD-10-CM | POA: Diagnosis not present

## 2019-11-03 DIAGNOSIS — Z20828 Contact with and (suspected) exposure to other viral communicable diseases: Secondary | ICD-10-CM | POA: Diagnosis not present

## 2019-11-08 DIAGNOSIS — Z20828 Contact with and (suspected) exposure to other viral communicable diseases: Secondary | ICD-10-CM | POA: Diagnosis not present

## 2019-11-08 DIAGNOSIS — Z1159 Encounter for screening for other viral diseases: Secondary | ICD-10-CM | POA: Diagnosis not present

## 2019-11-15 DIAGNOSIS — Z1159 Encounter for screening for other viral diseases: Secondary | ICD-10-CM | POA: Diagnosis not present

## 2019-11-15 DIAGNOSIS — Z20828 Contact with and (suspected) exposure to other viral communicable diseases: Secondary | ICD-10-CM | POA: Diagnosis not present

## 2019-11-22 DIAGNOSIS — Z20828 Contact with and (suspected) exposure to other viral communicable diseases: Secondary | ICD-10-CM | POA: Diagnosis not present

## 2019-11-22 DIAGNOSIS — Z1159 Encounter for screening for other viral diseases: Secondary | ICD-10-CM | POA: Diagnosis not present

## 2019-11-25 DIAGNOSIS — Z20828 Contact with and (suspected) exposure to other viral communicable diseases: Secondary | ICD-10-CM | POA: Diagnosis not present

## 2019-11-25 DIAGNOSIS — Z1159 Encounter for screening for other viral diseases: Secondary | ICD-10-CM | POA: Diagnosis not present

## 2019-11-26 DIAGNOSIS — I509 Heart failure, unspecified: Secondary | ICD-10-CM | POA: Diagnosis not present

## 2019-11-26 DIAGNOSIS — K219 Gastro-esophageal reflux disease without esophagitis: Secondary | ICD-10-CM | POA: Diagnosis not present

## 2019-11-26 DIAGNOSIS — I4891 Unspecified atrial fibrillation: Secondary | ICD-10-CM | POA: Diagnosis not present

## 2019-11-26 DIAGNOSIS — I110 Hypertensive heart disease with heart failure: Secondary | ICD-10-CM | POA: Diagnosis not present

## 2019-11-26 DIAGNOSIS — E119 Type 2 diabetes mellitus without complications: Secondary | ICD-10-CM | POA: Diagnosis not present

## 2019-11-26 DIAGNOSIS — I447 Left bundle-branch block, unspecified: Secondary | ICD-10-CM | POA: Diagnosis not present

## 2019-11-26 DIAGNOSIS — I255 Ischemic cardiomyopathy: Secondary | ICD-10-CM | POA: Diagnosis not present

## 2019-11-26 DIAGNOSIS — F329 Major depressive disorder, single episode, unspecified: Secondary | ICD-10-CM | POA: Diagnosis not present

## 2019-11-26 DIAGNOSIS — H3530 Unspecified macular degeneration: Secondary | ICD-10-CM | POA: Diagnosis not present

## 2019-11-29 DIAGNOSIS — Z1159 Encounter for screening for other viral diseases: Secondary | ICD-10-CM | POA: Diagnosis not present

## 2019-11-29 DIAGNOSIS — Z20828 Contact with and (suspected) exposure to other viral communicable diseases: Secondary | ICD-10-CM | POA: Diagnosis not present

## 2019-12-01 DIAGNOSIS — K219 Gastro-esophageal reflux disease without esophagitis: Secondary | ICD-10-CM | POA: Diagnosis not present

## 2019-12-01 DIAGNOSIS — I447 Left bundle-branch block, unspecified: Secondary | ICD-10-CM | POA: Diagnosis not present

## 2019-12-01 DIAGNOSIS — F329 Major depressive disorder, single episode, unspecified: Secondary | ICD-10-CM | POA: Diagnosis not present

## 2019-12-01 DIAGNOSIS — I255 Ischemic cardiomyopathy: Secondary | ICD-10-CM | POA: Diagnosis not present

## 2019-12-01 DIAGNOSIS — H3530 Unspecified macular degeneration: Secondary | ICD-10-CM | POA: Diagnosis not present

## 2019-12-01 DIAGNOSIS — I110 Hypertensive heart disease with heart failure: Secondary | ICD-10-CM | POA: Diagnosis not present

## 2019-12-01 DIAGNOSIS — I509 Heart failure, unspecified: Secondary | ICD-10-CM | POA: Diagnosis not present

## 2019-12-01 DIAGNOSIS — E119 Type 2 diabetes mellitus without complications: Secondary | ICD-10-CM | POA: Diagnosis not present

## 2019-12-01 DIAGNOSIS — I4891 Unspecified atrial fibrillation: Secondary | ICD-10-CM | POA: Diagnosis not present

## 2019-12-02 DIAGNOSIS — Z1159 Encounter for screening for other viral diseases: Secondary | ICD-10-CM | POA: Diagnosis not present

## 2019-12-02 DIAGNOSIS — Z20828 Contact with and (suspected) exposure to other viral communicable diseases: Secondary | ICD-10-CM | POA: Diagnosis not present

## 2019-12-03 DIAGNOSIS — I4891 Unspecified atrial fibrillation: Secondary | ICD-10-CM | POA: Diagnosis not present

## 2019-12-03 DIAGNOSIS — I255 Ischemic cardiomyopathy: Secondary | ICD-10-CM | POA: Diagnosis not present

## 2019-12-03 DIAGNOSIS — E119 Type 2 diabetes mellitus without complications: Secondary | ICD-10-CM | POA: Diagnosis not present

## 2019-12-03 DIAGNOSIS — F329 Major depressive disorder, single episode, unspecified: Secondary | ICD-10-CM | POA: Diagnosis not present

## 2019-12-03 DIAGNOSIS — K219 Gastro-esophageal reflux disease without esophagitis: Secondary | ICD-10-CM | POA: Diagnosis not present

## 2019-12-03 DIAGNOSIS — I447 Left bundle-branch block, unspecified: Secondary | ICD-10-CM | POA: Diagnosis not present

## 2019-12-03 DIAGNOSIS — H3530 Unspecified macular degeneration: Secondary | ICD-10-CM | POA: Diagnosis not present

## 2019-12-03 DIAGNOSIS — I110 Hypertensive heart disease with heart failure: Secondary | ICD-10-CM | POA: Diagnosis not present

## 2019-12-03 DIAGNOSIS — I509 Heart failure, unspecified: Secondary | ICD-10-CM | POA: Diagnosis not present

## 2019-12-06 DIAGNOSIS — I4891 Unspecified atrial fibrillation: Secondary | ICD-10-CM | POA: Diagnosis not present

## 2019-12-06 DIAGNOSIS — H3530 Unspecified macular degeneration: Secondary | ICD-10-CM | POA: Diagnosis not present

## 2019-12-06 DIAGNOSIS — Z20828 Contact with and (suspected) exposure to other viral communicable diseases: Secondary | ICD-10-CM | POA: Diagnosis not present

## 2019-12-06 DIAGNOSIS — F329 Major depressive disorder, single episode, unspecified: Secondary | ICD-10-CM | POA: Diagnosis not present

## 2019-12-06 DIAGNOSIS — I255 Ischemic cardiomyopathy: Secondary | ICD-10-CM | POA: Diagnosis not present

## 2019-12-06 DIAGNOSIS — M79675 Pain in left toe(s): Secondary | ICD-10-CM | POA: Diagnosis not present

## 2019-12-06 DIAGNOSIS — E119 Type 2 diabetes mellitus without complications: Secondary | ICD-10-CM | POA: Diagnosis not present

## 2019-12-06 DIAGNOSIS — I447 Left bundle-branch block, unspecified: Secondary | ICD-10-CM | POA: Diagnosis not present

## 2019-12-06 DIAGNOSIS — Z1159 Encounter for screening for other viral diseases: Secondary | ICD-10-CM | POA: Diagnosis not present

## 2019-12-06 DIAGNOSIS — K219 Gastro-esophageal reflux disease without esophagitis: Secondary | ICD-10-CM | POA: Diagnosis not present

## 2019-12-06 DIAGNOSIS — M79674 Pain in right toe(s): Secondary | ICD-10-CM | POA: Diagnosis not present

## 2019-12-06 DIAGNOSIS — I110 Hypertensive heart disease with heart failure: Secondary | ICD-10-CM | POA: Diagnosis not present

## 2019-12-06 DIAGNOSIS — I509 Heart failure, unspecified: Secondary | ICD-10-CM | POA: Diagnosis not present

## 2019-12-06 DIAGNOSIS — B351 Tinea unguium: Secondary | ICD-10-CM | POA: Diagnosis not present

## 2019-12-08 DIAGNOSIS — I447 Left bundle-branch block, unspecified: Secondary | ICD-10-CM | POA: Diagnosis not present

## 2019-12-08 DIAGNOSIS — I255 Ischemic cardiomyopathy: Secondary | ICD-10-CM | POA: Diagnosis not present

## 2019-12-08 DIAGNOSIS — H3530 Unspecified macular degeneration: Secondary | ICD-10-CM | POA: Diagnosis not present

## 2019-12-08 DIAGNOSIS — I110 Hypertensive heart disease with heart failure: Secondary | ICD-10-CM | POA: Diagnosis not present

## 2019-12-08 DIAGNOSIS — I509 Heart failure, unspecified: Secondary | ICD-10-CM | POA: Diagnosis not present

## 2019-12-08 DIAGNOSIS — K219 Gastro-esophageal reflux disease without esophagitis: Secondary | ICD-10-CM | POA: Diagnosis not present

## 2019-12-08 DIAGNOSIS — E119 Type 2 diabetes mellitus without complications: Secondary | ICD-10-CM | POA: Diagnosis not present

## 2019-12-08 DIAGNOSIS — F329 Major depressive disorder, single episode, unspecified: Secondary | ICD-10-CM | POA: Diagnosis not present

## 2019-12-08 DIAGNOSIS — I4891 Unspecified atrial fibrillation: Secondary | ICD-10-CM | POA: Diagnosis not present

## 2019-12-09 DIAGNOSIS — H3530 Unspecified macular degeneration: Secondary | ICD-10-CM | POA: Diagnosis not present

## 2019-12-09 DIAGNOSIS — Z1159 Encounter for screening for other viral diseases: Secondary | ICD-10-CM | POA: Diagnosis not present

## 2019-12-09 DIAGNOSIS — I4891 Unspecified atrial fibrillation: Secondary | ICD-10-CM | POA: Diagnosis not present

## 2019-12-09 DIAGNOSIS — I110 Hypertensive heart disease with heart failure: Secondary | ICD-10-CM | POA: Diagnosis not present

## 2019-12-09 DIAGNOSIS — K219 Gastro-esophageal reflux disease without esophagitis: Secondary | ICD-10-CM | POA: Diagnosis not present

## 2019-12-09 DIAGNOSIS — I509 Heart failure, unspecified: Secondary | ICD-10-CM | POA: Diagnosis not present

## 2019-12-09 DIAGNOSIS — F329 Major depressive disorder, single episode, unspecified: Secondary | ICD-10-CM | POA: Diagnosis not present

## 2019-12-09 DIAGNOSIS — E119 Type 2 diabetes mellitus without complications: Secondary | ICD-10-CM | POA: Diagnosis not present

## 2019-12-09 DIAGNOSIS — Z20828 Contact with and (suspected) exposure to other viral communicable diseases: Secondary | ICD-10-CM | POA: Diagnosis not present

## 2019-12-09 DIAGNOSIS — I255 Ischemic cardiomyopathy: Secondary | ICD-10-CM | POA: Diagnosis not present

## 2019-12-09 DIAGNOSIS — I447 Left bundle-branch block, unspecified: Secondary | ICD-10-CM | POA: Diagnosis not present

## 2019-12-13 DIAGNOSIS — Z1159 Encounter for screening for other viral diseases: Secondary | ICD-10-CM | POA: Diagnosis not present

## 2019-12-13 DIAGNOSIS — F329 Major depressive disorder, single episode, unspecified: Secondary | ICD-10-CM | POA: Diagnosis not present

## 2019-12-13 DIAGNOSIS — E119 Type 2 diabetes mellitus without complications: Secondary | ICD-10-CM | POA: Diagnosis not present

## 2019-12-13 DIAGNOSIS — I110 Hypertensive heart disease with heart failure: Secondary | ICD-10-CM | POA: Diagnosis not present

## 2019-12-13 DIAGNOSIS — I447 Left bundle-branch block, unspecified: Secondary | ICD-10-CM | POA: Diagnosis not present

## 2019-12-13 DIAGNOSIS — Z20828 Contact with and (suspected) exposure to other viral communicable diseases: Secondary | ICD-10-CM | POA: Diagnosis not present

## 2019-12-13 DIAGNOSIS — I4891 Unspecified atrial fibrillation: Secondary | ICD-10-CM | POA: Diagnosis not present

## 2019-12-13 DIAGNOSIS — K219 Gastro-esophageal reflux disease without esophagitis: Secondary | ICD-10-CM | POA: Diagnosis not present

## 2019-12-13 DIAGNOSIS — H3530 Unspecified macular degeneration: Secondary | ICD-10-CM | POA: Diagnosis not present

## 2019-12-13 DIAGNOSIS — I509 Heart failure, unspecified: Secondary | ICD-10-CM | POA: Diagnosis not present

## 2019-12-13 DIAGNOSIS — I255 Ischemic cardiomyopathy: Secondary | ICD-10-CM | POA: Diagnosis not present

## 2019-12-15 DIAGNOSIS — E119 Type 2 diabetes mellitus without complications: Secondary | ICD-10-CM | POA: Diagnosis not present

## 2019-12-15 DIAGNOSIS — I447 Left bundle-branch block, unspecified: Secondary | ICD-10-CM | POA: Diagnosis not present

## 2019-12-15 DIAGNOSIS — F329 Major depressive disorder, single episode, unspecified: Secondary | ICD-10-CM | POA: Diagnosis not present

## 2019-12-15 DIAGNOSIS — I255 Ischemic cardiomyopathy: Secondary | ICD-10-CM | POA: Diagnosis not present

## 2019-12-15 DIAGNOSIS — I4891 Unspecified atrial fibrillation: Secondary | ICD-10-CM | POA: Diagnosis not present

## 2019-12-15 DIAGNOSIS — I509 Heart failure, unspecified: Secondary | ICD-10-CM | POA: Diagnosis not present

## 2019-12-15 DIAGNOSIS — I110 Hypertensive heart disease with heart failure: Secondary | ICD-10-CM | POA: Diagnosis not present

## 2019-12-15 DIAGNOSIS — H3530 Unspecified macular degeneration: Secondary | ICD-10-CM | POA: Diagnosis not present

## 2019-12-15 DIAGNOSIS — K219 Gastro-esophageal reflux disease without esophagitis: Secondary | ICD-10-CM | POA: Diagnosis not present

## 2019-12-16 DIAGNOSIS — Z1159 Encounter for screening for other viral diseases: Secondary | ICD-10-CM | POA: Diagnosis not present

## 2019-12-16 DIAGNOSIS — Z20828 Contact with and (suspected) exposure to other viral communicable diseases: Secondary | ICD-10-CM | POA: Diagnosis not present

## 2019-12-20 DIAGNOSIS — H3530 Unspecified macular degeneration: Secondary | ICD-10-CM | POA: Diagnosis not present

## 2019-12-20 DIAGNOSIS — E119 Type 2 diabetes mellitus without complications: Secondary | ICD-10-CM | POA: Diagnosis not present

## 2019-12-20 DIAGNOSIS — I447 Left bundle-branch block, unspecified: Secondary | ICD-10-CM | POA: Diagnosis not present

## 2019-12-20 DIAGNOSIS — Z1159 Encounter for screening for other viral diseases: Secondary | ICD-10-CM | POA: Diagnosis not present

## 2019-12-20 DIAGNOSIS — F329 Major depressive disorder, single episode, unspecified: Secondary | ICD-10-CM | POA: Diagnosis not present

## 2019-12-20 DIAGNOSIS — I110 Hypertensive heart disease with heart failure: Secondary | ICD-10-CM | POA: Diagnosis not present

## 2019-12-20 DIAGNOSIS — Z20828 Contact with and (suspected) exposure to other viral communicable diseases: Secondary | ICD-10-CM | POA: Diagnosis not present

## 2019-12-20 DIAGNOSIS — K219 Gastro-esophageal reflux disease without esophagitis: Secondary | ICD-10-CM | POA: Diagnosis not present

## 2019-12-20 DIAGNOSIS — I509 Heart failure, unspecified: Secondary | ICD-10-CM | POA: Diagnosis not present

## 2019-12-20 DIAGNOSIS — I4891 Unspecified atrial fibrillation: Secondary | ICD-10-CM | POA: Diagnosis not present

## 2019-12-20 DIAGNOSIS — I255 Ischemic cardiomyopathy: Secondary | ICD-10-CM | POA: Diagnosis not present

## 2019-12-22 DIAGNOSIS — I110 Hypertensive heart disease with heart failure: Secondary | ICD-10-CM | POA: Diagnosis not present

## 2019-12-22 DIAGNOSIS — I509 Heart failure, unspecified: Secondary | ICD-10-CM | POA: Diagnosis not present

## 2019-12-22 DIAGNOSIS — E119 Type 2 diabetes mellitus without complications: Secondary | ICD-10-CM | POA: Diagnosis not present

## 2019-12-22 DIAGNOSIS — H3530 Unspecified macular degeneration: Secondary | ICD-10-CM | POA: Diagnosis not present

## 2019-12-22 DIAGNOSIS — I447 Left bundle-branch block, unspecified: Secondary | ICD-10-CM | POA: Diagnosis not present

## 2019-12-22 DIAGNOSIS — F329 Major depressive disorder, single episode, unspecified: Secondary | ICD-10-CM | POA: Diagnosis not present

## 2019-12-22 DIAGNOSIS — I4891 Unspecified atrial fibrillation: Secondary | ICD-10-CM | POA: Diagnosis not present

## 2019-12-22 DIAGNOSIS — I255 Ischemic cardiomyopathy: Secondary | ICD-10-CM | POA: Diagnosis not present

## 2019-12-22 DIAGNOSIS — K219 Gastro-esophageal reflux disease without esophagitis: Secondary | ICD-10-CM | POA: Diagnosis not present

## 2019-12-23 DIAGNOSIS — Z20828 Contact with and (suspected) exposure to other viral communicable diseases: Secondary | ICD-10-CM | POA: Diagnosis not present

## 2019-12-23 DIAGNOSIS — Z1159 Encounter for screening for other viral diseases: Secondary | ICD-10-CM | POA: Diagnosis not present

## 2019-12-26 DIAGNOSIS — I4891 Unspecified atrial fibrillation: Secondary | ICD-10-CM | POA: Diagnosis not present

## 2019-12-26 DIAGNOSIS — E119 Type 2 diabetes mellitus without complications: Secondary | ICD-10-CM | POA: Diagnosis not present

## 2019-12-26 DIAGNOSIS — H3530 Unspecified macular degeneration: Secondary | ICD-10-CM | POA: Diagnosis not present

## 2019-12-26 DIAGNOSIS — F329 Major depressive disorder, single episode, unspecified: Secondary | ICD-10-CM | POA: Diagnosis not present

## 2019-12-26 DIAGNOSIS — K219 Gastro-esophageal reflux disease without esophagitis: Secondary | ICD-10-CM | POA: Diagnosis not present

## 2019-12-26 DIAGNOSIS — I110 Hypertensive heart disease with heart failure: Secondary | ICD-10-CM | POA: Diagnosis not present

## 2019-12-26 DIAGNOSIS — I447 Left bundle-branch block, unspecified: Secondary | ICD-10-CM | POA: Diagnosis not present

## 2019-12-26 DIAGNOSIS — I509 Heart failure, unspecified: Secondary | ICD-10-CM | POA: Diagnosis not present

## 2019-12-26 DIAGNOSIS — I255 Ischemic cardiomyopathy: Secondary | ICD-10-CM | POA: Diagnosis not present

## 2019-12-27 DIAGNOSIS — Z20828 Contact with and (suspected) exposure to other viral communicable diseases: Secondary | ICD-10-CM | POA: Diagnosis not present

## 2019-12-27 DIAGNOSIS — F329 Major depressive disorder, single episode, unspecified: Secondary | ICD-10-CM | POA: Diagnosis not present

## 2019-12-27 DIAGNOSIS — E119 Type 2 diabetes mellitus without complications: Secondary | ICD-10-CM | POA: Diagnosis not present

## 2019-12-27 DIAGNOSIS — I4891 Unspecified atrial fibrillation: Secondary | ICD-10-CM | POA: Diagnosis not present

## 2019-12-27 DIAGNOSIS — H3530 Unspecified macular degeneration: Secondary | ICD-10-CM | POA: Diagnosis not present

## 2019-12-27 DIAGNOSIS — I509 Heart failure, unspecified: Secondary | ICD-10-CM | POA: Diagnosis not present

## 2019-12-27 DIAGNOSIS — K219 Gastro-esophageal reflux disease without esophagitis: Secondary | ICD-10-CM | POA: Diagnosis not present

## 2019-12-27 DIAGNOSIS — I447 Left bundle-branch block, unspecified: Secondary | ICD-10-CM | POA: Diagnosis not present

## 2019-12-27 DIAGNOSIS — Z1159 Encounter for screening for other viral diseases: Secondary | ICD-10-CM | POA: Diagnosis not present

## 2019-12-27 DIAGNOSIS — I110 Hypertensive heart disease with heart failure: Secondary | ICD-10-CM | POA: Diagnosis not present

## 2019-12-27 DIAGNOSIS — I255 Ischemic cardiomyopathy: Secondary | ICD-10-CM | POA: Diagnosis not present

## 2019-12-30 DIAGNOSIS — Z20828 Contact with and (suspected) exposure to other viral communicable diseases: Secondary | ICD-10-CM | POA: Diagnosis not present

## 2019-12-30 DIAGNOSIS — Z1159 Encounter for screening for other viral diseases: Secondary | ICD-10-CM | POA: Diagnosis not present

## 2019-12-31 ENCOUNTER — Non-Acute Institutional Stay: Payer: Medicare HMO | Admitting: Hospice

## 2019-12-31 ENCOUNTER — Other Ambulatory Visit: Payer: Self-pay

## 2019-12-31 DIAGNOSIS — I110 Hypertensive heart disease with heart failure: Secondary | ICD-10-CM | POA: Diagnosis not present

## 2019-12-31 DIAGNOSIS — Z515 Encounter for palliative care: Secondary | ICD-10-CM

## 2019-12-31 DIAGNOSIS — H3530 Unspecified macular degeneration: Secondary | ICD-10-CM | POA: Diagnosis not present

## 2019-12-31 DIAGNOSIS — I4891 Unspecified atrial fibrillation: Secondary | ICD-10-CM | POA: Diagnosis not present

## 2019-12-31 DIAGNOSIS — I509 Heart failure, unspecified: Secondary | ICD-10-CM | POA: Diagnosis not present

## 2019-12-31 DIAGNOSIS — I255 Ischemic cardiomyopathy: Secondary | ICD-10-CM | POA: Diagnosis not present

## 2019-12-31 DIAGNOSIS — F329 Major depressive disorder, single episode, unspecified: Secondary | ICD-10-CM | POA: Diagnosis not present

## 2019-12-31 DIAGNOSIS — I447 Left bundle-branch block, unspecified: Secondary | ICD-10-CM | POA: Diagnosis not present

## 2019-12-31 DIAGNOSIS — K219 Gastro-esophageal reflux disease without esophagitis: Secondary | ICD-10-CM | POA: Diagnosis not present

## 2019-12-31 DIAGNOSIS — E119 Type 2 diabetes mellitus without complications: Secondary | ICD-10-CM | POA: Diagnosis not present

## 2019-12-31 NOTE — Progress Notes (Addendum)
[facility] Consult Note Telephone: [phone]  Fax: [phone]  PATIENT NAME: [name] DOB: 01-09-1923 MRN: [id number]  PRIMARY CARE PROVIDER:   Mayra Neer, MD  REFERRING PROVIDER:  Mayra Neer, MD [address] [place],  [place] [address]  RESPONSIBLE PARTY:   Self Contact: [place] [name] - Niece. POA [phone]  Advance Care Planning/Goals of Care:  Visit consisted of building trust and follow upon palliative care.Patient is a DNR. Goals of care include to maximize quality of life and symptom management. Spoke with [name] and updated her on the visit. She expressed thanks, acknowledged remembering that we spoke in December during the first telehealth visit with patient. Symptom management: On arrival for visit, patient was seen standing in her bedroom, breathing hard trying to make her bed in tight corners. She was persuaded to stop and get on her recliner in the living room. Breathing returned to normal. An aid was called in to make her bed during visit. Education provided on the need to ask/call for help and always use her walker to prevent a fall. She verbalized understanding and expressed gratitude. Nursing staff informed. Edema in Bilateral lower edema is resolved.  Patient continues with her diuretics as ordered,  and elevation of extremity during the day. Patient said she has constipation sometimes and Miralax resolves it; no recent constipation. She denied pain/discomfort, no further shortness of breath, no cough. She endorsed weakness but says she still gets around okay. She is currently working with PT/OT. Nursing staff Monique with no complaints/concerns. Appetite is good. Encouraged ongoing supportive care.  Follow up: Palliative care will continue to follow patient for goals of care clarification and symptom management. I spent 50 minutes providing this consultation; time includes chart review and  documentation. More than 50% of the time in this consultation was spent on coordinating communication HISTORY OF PRESENT ILLNESS:  [name] is a 86 y.o. year old female with multiple medical problems including HTN Afib, CHF, GERD. Palliative Care was asked to help address goals of care.Marland Kitchen    CODE STATUS: DNR  PPS: 50% HOSPICE ELIGIBILITY/DIAGNOSIS: TBD  PAST MEDICAL HISTORY:  Past Medical History:  Diagnosis Date  . Atrial fibrillation ([place])   . Cholelithiasis   . Constipation   . Diabetes mellitus   . Dizziness    with some gait disability  . GERD (gastroesophageal reflux disease)   . Hepatic cyst   . Hyperlipidemia   . Hypertension   . Ischemic cardiomyopathy   . LBBB (left bundle branch block)   . Macular degeneration   . Other postprocedural status(V45.89)   . Peripheral edema     SOCIAL HX:  Social History   Tobacco Use  . Smoking status: Passive Smoke Exposure - Never Smoker  . Smokeless tobacco: Never Used  . 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 Alcohol use: No    ALLERGIES:  Allergies  Allergen Reactions  . Fosamax [Alendronate Sodium] Swelling  . Penicillins Swelling and Other (See Comments)    Swelling, redness and warmth at injection site Has patient had a PCN reaction causing immediate rash, facial/tongue/throat swelling, SOB or lightheadedness with hypotension: No Has patient had a PCN reaction causing severe rash involving mucus membranes or skin necrosis: No Has patient had a PCN reaction that required hospitalization: No Has patient had a PCN reaction occurring within the last 10 years: No If all of  the above answers are "NO", then may proceed with Cephalosporin use.  . Sulfonamide Derivatives Swelling  . Tetanus Toxoid Swelling     PERTINENT MEDICATIONS:  Outpatient Encounter Medications as of 12/31/2019  Medication Sig  . acetaminophen (TYLENOL) 325 MG tablet Take 325 mg by  mouth every 6 (six) hours as needed (pain).  Marland Kitchen acetaminophen (TYLENOL) 500 MG tablet Take 500 mg by mouth every evening.  Marland Kitchen aspirin 81 MG chewable tablet Chew 81 mg by mouth daily.   . benzonatate (TESSALON) 100 MG capsule Take 100 mg by mouth 3 (three) times daily as needed for cough.  . carvedilol (COREG) 6.25 MG tablet Take 6.25 mg by mouth 2 (two) times daily.  . colestipol (COLESTID) 1 g tablet Take 2 tablets (2 g total) by mouth daily. Hold FOR CONSTIPATION (Patient not taking: Reported on 07/02/2019)  . diazepam (VALIUM) 5 MG tablet Take 2.5-5 mg by mouth See admin instructions. Take 2.5mg  by mouth in the morning and take 5mg  by mouth before bedtime  . guaifenesin (ROBITUSSIN) 100 MG/5ML syrup Take 200 mg by mouth every 8 (eight) hours as needed for cough.  . lactobacillus acidophilus & bulgar (LACTINEX) chewable tablet Chew 1 tablet by mouth daily.  levothyroxine (SYNTHROID, LEVOTHROID) 25 MCG tablet Take 25 mcg by mouth daily before breakfast.  . loperamide (IMODIUM A-D) 2 MG tablet Take 2-4 mg by mouth See admin instructions. Take 2 tablets (4 mg) by mouth as needed after 1st loose stool followed by 1 tablet (2 mg) after each subsequent loose stool.  . magnesium hydroxide (MILK OF MAGNESIA) 400 MG/5ML suspension Take 15 mLs by mouth daily as needed (constipation).   . mirtazapine (REMERON) 30 MG tablet Take 30 mg by mouth at bedtime.   . Multiple Vitamins-Minerals (PRESERVISION AREDS PO) Take 1 tablet by mouth daily.   Marland Kitchen omeprazole (PRILOSEC) 20 MG capsule Take 20 mg by mouth daily.  . ondansetron (ZOFRAN) 4 MG tablet Take 4 mg by mouth daily as needed for nausea.   . sertraline (ZOLOFT) 100 MG tablet Take 100 mg by mouth daily.  . simvastatin (ZOCOR) 40 MG tablet Take 40 mg by mouth daily.  Marland Kitchen spironolactone (ALDACTONE) 25 MG tablet Take 0.5 tablets (12.5 mg total) by mouth daily.  Marland Kitchen torsemide (DEMADEX) 20 MG tablet Take 20 mg by mouth daily.   No facility-administered encounter  medications on file as of 12/31/2019.    PHYSICAL EXAM:   General:cooperative; in no acute distress Cardiovascular: regular rate and rhythm Pulmonary: clear ant/posterior  Fields; patient was short of breath trying to make her bed but recovered quickly when she sat and relaxed in her recliner 02 96% RA Abdomen: soft, nontender, + bowel sounds GU: no suprapubic tenderness Extremities: no edema, no joint deformities Skin: no rashes to exposed skin Neurological: Weakness but otherwise nonfocal  02/28/2020, NP

## 2020-01-03 DIAGNOSIS — Z20828 Contact with and (suspected) exposure to other viral communicable diseases: Secondary | ICD-10-CM | POA: Diagnosis not present

## 2020-01-03 DIAGNOSIS — Z1159 Encounter for screening for other viral diseases: Secondary | ICD-10-CM | POA: Diagnosis not present

## 2020-01-04 DIAGNOSIS — H3530 Unspecified macular degeneration: Secondary | ICD-10-CM | POA: Diagnosis not present

## 2020-01-04 DIAGNOSIS — I4891 Unspecified atrial fibrillation: Secondary | ICD-10-CM | POA: Diagnosis not present

## 2020-01-04 DIAGNOSIS — I447 Left bundle-branch block, unspecified: Secondary | ICD-10-CM | POA: Diagnosis not present

## 2020-01-04 DIAGNOSIS — K219 Gastro-esophageal reflux disease without esophagitis: Secondary | ICD-10-CM | POA: Diagnosis not present

## 2020-01-04 DIAGNOSIS — I509 Heart failure, unspecified: Secondary | ICD-10-CM | POA: Diagnosis not present

## 2020-01-04 DIAGNOSIS — I110 Hypertensive heart disease with heart failure: Secondary | ICD-10-CM | POA: Diagnosis not present

## 2020-01-04 DIAGNOSIS — E119 Type 2 diabetes mellitus without complications: Secondary | ICD-10-CM | POA: Diagnosis not present

## 2020-01-04 DIAGNOSIS — I255 Ischemic cardiomyopathy: Secondary | ICD-10-CM | POA: Diagnosis not present

## 2020-01-04 DIAGNOSIS — F329 Major depressive disorder, single episode, unspecified: Secondary | ICD-10-CM | POA: Diagnosis not present

## 2020-01-06 DIAGNOSIS — I509 Heart failure, unspecified: Secondary | ICD-10-CM | POA: Diagnosis not present

## 2020-01-06 DIAGNOSIS — F329 Major depressive disorder, single episode, unspecified: Secondary | ICD-10-CM | POA: Diagnosis not present

## 2020-01-06 DIAGNOSIS — I4891 Unspecified atrial fibrillation: Secondary | ICD-10-CM | POA: Diagnosis not present

## 2020-01-06 DIAGNOSIS — Z20828 Contact with and (suspected) exposure to other viral communicable diseases: Secondary | ICD-10-CM | POA: Diagnosis not present

## 2020-01-06 DIAGNOSIS — I255 Ischemic cardiomyopathy: Secondary | ICD-10-CM | POA: Diagnosis not present

## 2020-01-06 DIAGNOSIS — Z1159 Encounter for screening for other viral diseases: Secondary | ICD-10-CM | POA: Diagnosis not present

## 2020-01-06 DIAGNOSIS — I110 Hypertensive heart disease with heart failure: Secondary | ICD-10-CM | POA: Diagnosis not present

## 2020-01-06 DIAGNOSIS — I447 Left bundle-branch block, unspecified: Secondary | ICD-10-CM | POA: Diagnosis not present

## 2020-01-06 DIAGNOSIS — K219 Gastro-esophageal reflux disease without esophagitis: Secondary | ICD-10-CM | POA: Diagnosis not present

## 2020-01-06 DIAGNOSIS — E119 Type 2 diabetes mellitus without complications: Secondary | ICD-10-CM | POA: Diagnosis not present

## 2020-01-06 DIAGNOSIS — H3530 Unspecified macular degeneration: Secondary | ICD-10-CM | POA: Diagnosis not present

## 2020-01-10 DIAGNOSIS — Z1159 Encounter for screening for other viral diseases: Secondary | ICD-10-CM | POA: Diagnosis not present

## 2020-01-10 DIAGNOSIS — Z20828 Contact with and (suspected) exposure to other viral communicable diseases: Secondary | ICD-10-CM | POA: Diagnosis not present

## 2020-01-11 DIAGNOSIS — I509 Heart failure, unspecified: Secondary | ICD-10-CM | POA: Diagnosis not present

## 2020-01-11 DIAGNOSIS — H3530 Unspecified macular degeneration: Secondary | ICD-10-CM | POA: Diagnosis not present

## 2020-01-11 DIAGNOSIS — I110 Hypertensive heart disease with heart failure: Secondary | ICD-10-CM | POA: Diagnosis not present

## 2020-01-11 DIAGNOSIS — I4891 Unspecified atrial fibrillation: Secondary | ICD-10-CM | POA: Diagnosis not present

## 2020-01-11 DIAGNOSIS — I447 Left bundle-branch block, unspecified: Secondary | ICD-10-CM | POA: Diagnosis not present

## 2020-01-11 DIAGNOSIS — E119 Type 2 diabetes mellitus without complications: Secondary | ICD-10-CM | POA: Diagnosis not present

## 2020-01-11 DIAGNOSIS — K219 Gastro-esophageal reflux disease without esophagitis: Secondary | ICD-10-CM | POA: Diagnosis not present

## 2020-01-11 DIAGNOSIS — I255 Ischemic cardiomyopathy: Secondary | ICD-10-CM | POA: Diagnosis not present

## 2020-01-11 DIAGNOSIS — F329 Major depressive disorder, single episode, unspecified: Secondary | ICD-10-CM | POA: Diagnosis not present

## 2020-01-12 DIAGNOSIS — I4891 Unspecified atrial fibrillation: Secondary | ICD-10-CM | POA: Diagnosis not present

## 2020-01-12 DIAGNOSIS — K219 Gastro-esophageal reflux disease without esophagitis: Secondary | ICD-10-CM | POA: Diagnosis not present

## 2020-01-12 DIAGNOSIS — I110 Hypertensive heart disease with heart failure: Secondary | ICD-10-CM | POA: Diagnosis not present

## 2020-01-12 DIAGNOSIS — F329 Major depressive disorder, single episode, unspecified: Secondary | ICD-10-CM | POA: Diagnosis not present

## 2020-01-12 DIAGNOSIS — I509 Heart failure, unspecified: Secondary | ICD-10-CM | POA: Diagnosis not present

## 2020-01-12 DIAGNOSIS — I255 Ischemic cardiomyopathy: Secondary | ICD-10-CM | POA: Diagnosis not present

## 2020-01-12 DIAGNOSIS — H3530 Unspecified macular degeneration: Secondary | ICD-10-CM | POA: Diagnosis not present

## 2020-01-12 DIAGNOSIS — I447 Left bundle-branch block, unspecified: Secondary | ICD-10-CM | POA: Diagnosis not present

## 2020-01-12 DIAGNOSIS — E119 Type 2 diabetes mellitus without complications: Secondary | ICD-10-CM | POA: Diagnosis not present

## 2020-01-13 DIAGNOSIS — Z20828 Contact with and (suspected) exposure to other viral communicable diseases: Secondary | ICD-10-CM | POA: Diagnosis not present

## 2020-01-13 DIAGNOSIS — Z1159 Encounter for screening for other viral diseases: Secondary | ICD-10-CM | POA: Diagnosis not present

## 2020-01-14 DIAGNOSIS — I4891 Unspecified atrial fibrillation: Secondary | ICD-10-CM | POA: Diagnosis not present

## 2020-01-14 DIAGNOSIS — F329 Major depressive disorder, single episode, unspecified: Secondary | ICD-10-CM | POA: Diagnosis not present

## 2020-01-14 DIAGNOSIS — K219 Gastro-esophageal reflux disease without esophagitis: Secondary | ICD-10-CM | POA: Diagnosis not present

## 2020-01-14 DIAGNOSIS — H3530 Unspecified macular degeneration: Secondary | ICD-10-CM | POA: Diagnosis not present

## 2020-01-14 DIAGNOSIS — I110 Hypertensive heart disease with heart failure: Secondary | ICD-10-CM | POA: Diagnosis not present

## 2020-01-14 DIAGNOSIS — I509 Heart failure, unspecified: Secondary | ICD-10-CM | POA: Diagnosis not present

## 2020-01-14 DIAGNOSIS — E119 Type 2 diabetes mellitus without complications: Secondary | ICD-10-CM | POA: Diagnosis not present

## 2020-01-14 DIAGNOSIS — I447 Left bundle-branch block, unspecified: Secondary | ICD-10-CM | POA: Diagnosis not present

## 2020-01-14 DIAGNOSIS — I255 Ischemic cardiomyopathy: Secondary | ICD-10-CM | POA: Diagnosis not present

## 2020-01-17 DIAGNOSIS — H3530 Unspecified macular degeneration: Secondary | ICD-10-CM | POA: Diagnosis not present

## 2020-01-17 DIAGNOSIS — Z1159 Encounter for screening for other viral diseases: Secondary | ICD-10-CM | POA: Diagnosis not present

## 2020-01-17 DIAGNOSIS — I4891 Unspecified atrial fibrillation: Secondary | ICD-10-CM | POA: Diagnosis not present

## 2020-01-17 DIAGNOSIS — E119 Type 2 diabetes mellitus without complications: Secondary | ICD-10-CM | POA: Diagnosis not present

## 2020-01-17 DIAGNOSIS — K219 Gastro-esophageal reflux disease without esophagitis: Secondary | ICD-10-CM | POA: Diagnosis not present

## 2020-01-17 DIAGNOSIS — Z20828 Contact with and (suspected) exposure to other viral communicable diseases: Secondary | ICD-10-CM | POA: Diagnosis not present

## 2020-01-17 DIAGNOSIS — I509 Heart failure, unspecified: Secondary | ICD-10-CM | POA: Diagnosis not present

## 2020-01-17 DIAGNOSIS — F329 Major depressive disorder, single episode, unspecified: Secondary | ICD-10-CM | POA: Diagnosis not present

## 2020-01-17 DIAGNOSIS — I447 Left bundle-branch block, unspecified: Secondary | ICD-10-CM | POA: Diagnosis not present

## 2020-01-17 DIAGNOSIS — I255 Ischemic cardiomyopathy: Secondary | ICD-10-CM | POA: Diagnosis not present

## 2020-01-17 DIAGNOSIS — I110 Hypertensive heart disease with heart failure: Secondary | ICD-10-CM | POA: Diagnosis not present

## 2020-01-20 DIAGNOSIS — Z20828 Contact with and (suspected) exposure to other viral communicable diseases: Secondary | ICD-10-CM | POA: Diagnosis not present

## 2020-01-20 DIAGNOSIS — Z1159 Encounter for screening for other viral diseases: Secondary | ICD-10-CM | POA: Diagnosis not present

## 2020-01-21 DIAGNOSIS — I255 Ischemic cardiomyopathy: Secondary | ICD-10-CM | POA: Diagnosis not present

## 2020-01-21 DIAGNOSIS — I110 Hypertensive heart disease with heart failure: Secondary | ICD-10-CM | POA: Diagnosis not present

## 2020-01-21 DIAGNOSIS — I447 Left bundle-branch block, unspecified: Secondary | ICD-10-CM | POA: Diagnosis not present

## 2020-01-21 DIAGNOSIS — K219 Gastro-esophageal reflux disease without esophagitis: Secondary | ICD-10-CM | POA: Diagnosis not present

## 2020-01-21 DIAGNOSIS — I509 Heart failure, unspecified: Secondary | ICD-10-CM | POA: Diagnosis not present

## 2020-01-21 DIAGNOSIS — H3530 Unspecified macular degeneration: Secondary | ICD-10-CM | POA: Diagnosis not present

## 2020-01-21 DIAGNOSIS — E119 Type 2 diabetes mellitus without complications: Secondary | ICD-10-CM | POA: Diagnosis not present

## 2020-01-21 DIAGNOSIS — F329 Major depressive disorder, single episode, unspecified: Secondary | ICD-10-CM | POA: Diagnosis not present

## 2020-01-21 DIAGNOSIS — I4891 Unspecified atrial fibrillation: Secondary | ICD-10-CM | POA: Diagnosis not present

## 2020-01-24 DIAGNOSIS — Z20828 Contact with and (suspected) exposure to other viral communicable diseases: Secondary | ICD-10-CM | POA: Diagnosis not present

## 2020-01-24 DIAGNOSIS — Z1159 Encounter for screening for other viral diseases: Secondary | ICD-10-CM | POA: Diagnosis not present

## 2020-01-25 DIAGNOSIS — M545 Low back pain: Secondary | ICD-10-CM | POA: Diagnosis not present

## 2020-01-25 DIAGNOSIS — I4891 Unspecified atrial fibrillation: Secondary | ICD-10-CM | POA: Diagnosis not present

## 2020-01-25 DIAGNOSIS — G8929 Other chronic pain: Secondary | ICD-10-CM | POA: Diagnosis not present

## 2020-01-25 DIAGNOSIS — M79662 Pain in left lower leg: Secondary | ICD-10-CM | POA: Diagnosis not present

## 2020-01-25 DIAGNOSIS — M79661 Pain in right lower leg: Secondary | ICD-10-CM | POA: Diagnosis not present

## 2020-01-25 DIAGNOSIS — I509 Heart failure, unspecified: Secondary | ICD-10-CM | POA: Diagnosis not present

## 2020-01-25 DIAGNOSIS — F329 Major depressive disorder, single episode, unspecified: Secondary | ICD-10-CM | POA: Diagnosis not present

## 2020-01-25 DIAGNOSIS — I255 Ischemic cardiomyopathy: Secondary | ICD-10-CM | POA: Diagnosis not present

## 2020-01-25 DIAGNOSIS — I110 Hypertensive heart disease with heart failure: Secondary | ICD-10-CM | POA: Diagnosis not present

## 2020-01-25 DIAGNOSIS — E119 Type 2 diabetes mellitus without complications: Secondary | ICD-10-CM | POA: Diagnosis not present

## 2020-01-26 DIAGNOSIS — M545 Low back pain: Secondary | ICD-10-CM | POA: Diagnosis not present

## 2020-01-26 DIAGNOSIS — M79661 Pain in right lower leg: Secondary | ICD-10-CM | POA: Diagnosis not present

## 2020-01-26 DIAGNOSIS — I4891 Unspecified atrial fibrillation: Secondary | ICD-10-CM | POA: Diagnosis not present

## 2020-01-26 DIAGNOSIS — I110 Hypertensive heart disease with heart failure: Secondary | ICD-10-CM | POA: Diagnosis not present

## 2020-01-26 DIAGNOSIS — G8929 Other chronic pain: Secondary | ICD-10-CM | POA: Diagnosis not present

## 2020-01-26 DIAGNOSIS — E119 Type 2 diabetes mellitus without complications: Secondary | ICD-10-CM | POA: Diagnosis not present

## 2020-01-26 DIAGNOSIS — I509 Heart failure, unspecified: Secondary | ICD-10-CM | POA: Diagnosis not present

## 2020-01-26 DIAGNOSIS — F329 Major depressive disorder, single episode, unspecified: Secondary | ICD-10-CM | POA: Diagnosis not present

## 2020-01-26 DIAGNOSIS — M79662 Pain in left lower leg: Secondary | ICD-10-CM | POA: Diagnosis not present

## 2020-01-27 DIAGNOSIS — Z1159 Encounter for screening for other viral diseases: Secondary | ICD-10-CM | POA: Diagnosis not present

## 2020-01-27 DIAGNOSIS — Z20828 Contact with and (suspected) exposure to other viral communicable diseases: Secondary | ICD-10-CM | POA: Diagnosis not present

## 2020-01-28 DIAGNOSIS — R5383 Other fatigue: Secondary | ICD-10-CM | POA: Diagnosis not present

## 2020-01-28 DIAGNOSIS — F411 Generalized anxiety disorder: Secondary | ICD-10-CM | POA: Diagnosis not present

## 2020-01-28 DIAGNOSIS — K921 Melena: Secondary | ICD-10-CM | POA: Diagnosis not present

## 2020-01-28 DIAGNOSIS — K5900 Constipation, unspecified: Secondary | ICD-10-CM | POA: Diagnosis not present

## 2020-01-28 DIAGNOSIS — I509 Heart failure, unspecified: Secondary | ICD-10-CM | POA: Diagnosis not present

## 2020-01-28 DIAGNOSIS — H547 Unspecified visual loss: Secondary | ICD-10-CM | POA: Diagnosis not present

## 2020-01-28 DIAGNOSIS — E119 Type 2 diabetes mellitus without complications: Secondary | ICD-10-CM | POA: Diagnosis not present

## 2020-01-28 DIAGNOSIS — D692 Other nonthrombocytopenic purpura: Secondary | ICD-10-CM | POA: Diagnosis not present

## 2020-01-31 DIAGNOSIS — Z20828 Contact with and (suspected) exposure to other viral communicable diseases: Secondary | ICD-10-CM | POA: Diagnosis not present

## 2020-01-31 DIAGNOSIS — Z1159 Encounter for screening for other viral diseases: Secondary | ICD-10-CM | POA: Diagnosis not present

## 2020-02-01 DIAGNOSIS — M79662 Pain in left lower leg: Secondary | ICD-10-CM | POA: Diagnosis not present

## 2020-02-01 DIAGNOSIS — M79661 Pain in right lower leg: Secondary | ICD-10-CM | POA: Diagnosis not present

## 2020-02-01 DIAGNOSIS — E119 Type 2 diabetes mellitus without complications: Secondary | ICD-10-CM | POA: Diagnosis not present

## 2020-02-01 DIAGNOSIS — I509 Heart failure, unspecified: Secondary | ICD-10-CM | POA: Diagnosis not present

## 2020-02-01 DIAGNOSIS — G8929 Other chronic pain: Secondary | ICD-10-CM | POA: Diagnosis not present

## 2020-02-01 DIAGNOSIS — I4891 Unspecified atrial fibrillation: Secondary | ICD-10-CM | POA: Diagnosis not present

## 2020-02-01 DIAGNOSIS — I110 Hypertensive heart disease with heart failure: Secondary | ICD-10-CM | POA: Diagnosis not present

## 2020-02-01 DIAGNOSIS — M545 Low back pain: Secondary | ICD-10-CM | POA: Diagnosis not present

## 2020-02-01 DIAGNOSIS — F329 Major depressive disorder, single episode, unspecified: Secondary | ICD-10-CM | POA: Diagnosis not present

## 2020-02-03 DIAGNOSIS — I4891 Unspecified atrial fibrillation: Secondary | ICD-10-CM | POA: Diagnosis not present

## 2020-02-03 DIAGNOSIS — I509 Heart failure, unspecified: Secondary | ICD-10-CM | POA: Diagnosis not present

## 2020-02-03 DIAGNOSIS — G8929 Other chronic pain: Secondary | ICD-10-CM | POA: Diagnosis not present

## 2020-02-03 DIAGNOSIS — Z1159 Encounter for screening for other viral diseases: Secondary | ICD-10-CM | POA: Diagnosis not present

## 2020-02-03 DIAGNOSIS — F329 Major depressive disorder, single episode, unspecified: Secondary | ICD-10-CM | POA: Diagnosis not present

## 2020-02-03 DIAGNOSIS — M79661 Pain in right lower leg: Secondary | ICD-10-CM | POA: Diagnosis not present

## 2020-02-03 DIAGNOSIS — I110 Hypertensive heart disease with heart failure: Secondary | ICD-10-CM | POA: Diagnosis not present

## 2020-02-03 DIAGNOSIS — M545 Low back pain: Secondary | ICD-10-CM | POA: Diagnosis not present

## 2020-02-03 DIAGNOSIS — E119 Type 2 diabetes mellitus without complications: Secondary | ICD-10-CM | POA: Diagnosis not present

## 2020-02-03 DIAGNOSIS — M79662 Pain in left lower leg: Secondary | ICD-10-CM | POA: Diagnosis not present

## 2020-02-03 DIAGNOSIS — Z20828 Contact with and (suspected) exposure to other viral communicable diseases: Secondary | ICD-10-CM | POA: Diagnosis not present

## 2020-02-07 DIAGNOSIS — Z20828 Contact with and (suspected) exposure to other viral communicable diseases: Secondary | ICD-10-CM | POA: Diagnosis not present

## 2020-02-07 DIAGNOSIS — Z1159 Encounter for screening for other viral diseases: Secondary | ICD-10-CM | POA: Diagnosis not present

## 2020-02-08 DIAGNOSIS — I4891 Unspecified atrial fibrillation: Secondary | ICD-10-CM | POA: Diagnosis not present

## 2020-02-08 DIAGNOSIS — G8929 Other chronic pain: Secondary | ICD-10-CM | POA: Diagnosis not present

## 2020-02-08 DIAGNOSIS — M79661 Pain in right lower leg: Secondary | ICD-10-CM | POA: Diagnosis not present

## 2020-02-08 DIAGNOSIS — M79662 Pain in left lower leg: Secondary | ICD-10-CM | POA: Diagnosis not present

## 2020-02-08 DIAGNOSIS — I509 Heart failure, unspecified: Secondary | ICD-10-CM | POA: Diagnosis not present

## 2020-02-08 DIAGNOSIS — M545 Low back pain: Secondary | ICD-10-CM | POA: Diagnosis not present

## 2020-02-08 DIAGNOSIS — F329 Major depressive disorder, single episode, unspecified: Secondary | ICD-10-CM | POA: Diagnosis not present

## 2020-02-08 DIAGNOSIS — I110 Hypertensive heart disease with heart failure: Secondary | ICD-10-CM | POA: Diagnosis not present

## 2020-02-08 DIAGNOSIS — E119 Type 2 diabetes mellitus without complications: Secondary | ICD-10-CM | POA: Diagnosis not present

## 2020-02-10 DIAGNOSIS — Z20828 Contact with and (suspected) exposure to other viral communicable diseases: Secondary | ICD-10-CM | POA: Diagnosis not present

## 2020-02-10 DIAGNOSIS — Z1159 Encounter for screening for other viral diseases: Secondary | ICD-10-CM | POA: Diagnosis not present

## 2020-02-11 DIAGNOSIS — M545 Low back pain: Secondary | ICD-10-CM | POA: Diagnosis not present

## 2020-02-11 DIAGNOSIS — E119 Type 2 diabetes mellitus without complications: Secondary | ICD-10-CM | POA: Diagnosis not present

## 2020-02-11 DIAGNOSIS — M79662 Pain in left lower leg: Secondary | ICD-10-CM | POA: Diagnosis not present

## 2020-02-11 DIAGNOSIS — M79661 Pain in right lower leg: Secondary | ICD-10-CM | POA: Diagnosis not present

## 2020-02-11 DIAGNOSIS — I509 Heart failure, unspecified: Secondary | ICD-10-CM | POA: Diagnosis not present

## 2020-02-11 DIAGNOSIS — F329 Major depressive disorder, single episode, unspecified: Secondary | ICD-10-CM | POA: Diagnosis not present

## 2020-02-11 DIAGNOSIS — G8929 Other chronic pain: Secondary | ICD-10-CM | POA: Diagnosis not present

## 2020-02-11 DIAGNOSIS — I110 Hypertensive heart disease with heart failure: Secondary | ICD-10-CM | POA: Diagnosis not present

## 2020-02-11 DIAGNOSIS — I4891 Unspecified atrial fibrillation: Secondary | ICD-10-CM | POA: Diagnosis not present

## 2020-02-14 DIAGNOSIS — G8929 Other chronic pain: Secondary | ICD-10-CM | POA: Diagnosis not present

## 2020-02-14 DIAGNOSIS — Z20828 Contact with and (suspected) exposure to other viral communicable diseases: Secondary | ICD-10-CM | POA: Diagnosis not present

## 2020-02-14 DIAGNOSIS — Z1159 Encounter for screening for other viral diseases: Secondary | ICD-10-CM | POA: Diagnosis not present

## 2020-02-14 DIAGNOSIS — I4891 Unspecified atrial fibrillation: Secondary | ICD-10-CM | POA: Diagnosis not present

## 2020-02-14 DIAGNOSIS — F329 Major depressive disorder, single episode, unspecified: Secondary | ICD-10-CM | POA: Diagnosis not present

## 2020-02-14 DIAGNOSIS — I110 Hypertensive heart disease with heart failure: Secondary | ICD-10-CM | POA: Diagnosis not present

## 2020-02-14 DIAGNOSIS — I509 Heart failure, unspecified: Secondary | ICD-10-CM | POA: Diagnosis not present

## 2020-02-14 DIAGNOSIS — M545 Low back pain: Secondary | ICD-10-CM | POA: Diagnosis not present

## 2020-02-14 DIAGNOSIS — E119 Type 2 diabetes mellitus without complications: Secondary | ICD-10-CM | POA: Diagnosis not present

## 2020-02-14 DIAGNOSIS — M79661 Pain in right lower leg: Secondary | ICD-10-CM | POA: Diagnosis not present

## 2020-02-14 DIAGNOSIS — M79662 Pain in left lower leg: Secondary | ICD-10-CM | POA: Diagnosis not present

## 2020-02-16 DIAGNOSIS — I110 Hypertensive heart disease with heart failure: Secondary | ICD-10-CM | POA: Diagnosis not present

## 2020-02-16 DIAGNOSIS — F329 Major depressive disorder, single episode, unspecified: Secondary | ICD-10-CM | POA: Diagnosis not present

## 2020-02-16 DIAGNOSIS — M79661 Pain in right lower leg: Secondary | ICD-10-CM | POA: Diagnosis not present

## 2020-02-16 DIAGNOSIS — I4891 Unspecified atrial fibrillation: Secondary | ICD-10-CM | POA: Diagnosis not present

## 2020-02-16 DIAGNOSIS — M545 Low back pain: Secondary | ICD-10-CM | POA: Diagnosis not present

## 2020-02-16 DIAGNOSIS — E119 Type 2 diabetes mellitus without complications: Secondary | ICD-10-CM | POA: Diagnosis not present

## 2020-02-16 DIAGNOSIS — M79662 Pain in left lower leg: Secondary | ICD-10-CM | POA: Diagnosis not present

## 2020-02-16 DIAGNOSIS — I509 Heart failure, unspecified: Secondary | ICD-10-CM | POA: Diagnosis not present

## 2020-02-16 DIAGNOSIS — G8929 Other chronic pain: Secondary | ICD-10-CM | POA: Diagnosis not present

## 2020-02-17 DIAGNOSIS — Z20828 Contact with and (suspected) exposure to other viral communicable diseases: Secondary | ICD-10-CM | POA: Diagnosis not present

## 2020-02-17 DIAGNOSIS — Z1159 Encounter for screening for other viral diseases: Secondary | ICD-10-CM | POA: Diagnosis not present

## 2020-02-21 DIAGNOSIS — Z20828 Contact with and (suspected) exposure to other viral communicable diseases: Secondary | ICD-10-CM | POA: Diagnosis not present

## 2020-02-21 DIAGNOSIS — M79674 Pain in right toe(s): Secondary | ICD-10-CM | POA: Diagnosis not present

## 2020-02-21 DIAGNOSIS — Z1159 Encounter for screening for other viral diseases: Secondary | ICD-10-CM | POA: Diagnosis not present

## 2020-02-21 DIAGNOSIS — B351 Tinea unguium: Secondary | ICD-10-CM | POA: Diagnosis not present

## 2020-02-21 DIAGNOSIS — M79675 Pain in left toe(s): Secondary | ICD-10-CM | POA: Diagnosis not present

## 2020-02-22 DIAGNOSIS — I110 Hypertensive heart disease with heart failure: Secondary | ICD-10-CM | POA: Diagnosis not present

## 2020-02-22 DIAGNOSIS — E119 Type 2 diabetes mellitus without complications: Secondary | ICD-10-CM | POA: Diagnosis not present

## 2020-02-22 DIAGNOSIS — M79662 Pain in left lower leg: Secondary | ICD-10-CM | POA: Diagnosis not present

## 2020-02-22 DIAGNOSIS — F329 Major depressive disorder, single episode, unspecified: Secondary | ICD-10-CM | POA: Diagnosis not present

## 2020-02-22 DIAGNOSIS — I4891 Unspecified atrial fibrillation: Secondary | ICD-10-CM | POA: Diagnosis not present

## 2020-02-22 DIAGNOSIS — M545 Low back pain: Secondary | ICD-10-CM | POA: Diagnosis not present

## 2020-02-22 DIAGNOSIS — M79661 Pain in right lower leg: Secondary | ICD-10-CM | POA: Diagnosis not present

## 2020-02-22 DIAGNOSIS — G8929 Other chronic pain: Secondary | ICD-10-CM | POA: Diagnosis not present

## 2020-02-22 DIAGNOSIS — I509 Heart failure, unspecified: Secondary | ICD-10-CM | POA: Diagnosis not present

## 2020-02-24 DIAGNOSIS — I4891 Unspecified atrial fibrillation: Secondary | ICD-10-CM | POA: Diagnosis not present

## 2020-02-24 DIAGNOSIS — M79661 Pain in right lower leg: Secondary | ICD-10-CM | POA: Diagnosis not present

## 2020-02-24 DIAGNOSIS — E119 Type 2 diabetes mellitus without complications: Secondary | ICD-10-CM | POA: Diagnosis not present

## 2020-02-24 DIAGNOSIS — I110 Hypertensive heart disease with heart failure: Secondary | ICD-10-CM | POA: Diagnosis not present

## 2020-02-24 DIAGNOSIS — Z20828 Contact with and (suspected) exposure to other viral communicable diseases: Secondary | ICD-10-CM | POA: Diagnosis not present

## 2020-02-24 DIAGNOSIS — G8929 Other chronic pain: Secondary | ICD-10-CM | POA: Diagnosis not present

## 2020-02-24 DIAGNOSIS — Z1159 Encounter for screening for other viral diseases: Secondary | ICD-10-CM | POA: Diagnosis not present

## 2020-02-24 DIAGNOSIS — M545 Low back pain: Secondary | ICD-10-CM | POA: Diagnosis not present

## 2020-02-24 DIAGNOSIS — M79662 Pain in left lower leg: Secondary | ICD-10-CM | POA: Diagnosis not present

## 2020-02-24 DIAGNOSIS — F329 Major depressive disorder, single episode, unspecified: Secondary | ICD-10-CM | POA: Diagnosis not present

## 2020-02-24 DIAGNOSIS — I509 Heart failure, unspecified: Secondary | ICD-10-CM | POA: Diagnosis not present

## 2020-02-28 DIAGNOSIS — M545 Low back pain: Secondary | ICD-10-CM | POA: Diagnosis not present

## 2020-02-28 DIAGNOSIS — G8929 Other chronic pain: Secondary | ICD-10-CM | POA: Diagnosis not present

## 2020-02-28 DIAGNOSIS — M79662 Pain in left lower leg: Secondary | ICD-10-CM | POA: Diagnosis not present

## 2020-02-28 DIAGNOSIS — Z1159 Encounter for screening for other viral diseases: Secondary | ICD-10-CM | POA: Diagnosis not present

## 2020-02-28 DIAGNOSIS — I110 Hypertensive heart disease with heart failure: Secondary | ICD-10-CM | POA: Diagnosis not present

## 2020-02-28 DIAGNOSIS — M79661 Pain in right lower leg: Secondary | ICD-10-CM | POA: Diagnosis not present

## 2020-02-28 DIAGNOSIS — I4891 Unspecified atrial fibrillation: Secondary | ICD-10-CM | POA: Diagnosis not present

## 2020-02-28 DIAGNOSIS — Z20828 Contact with and (suspected) exposure to other viral communicable diseases: Secondary | ICD-10-CM | POA: Diagnosis not present

## 2020-02-28 DIAGNOSIS — F329 Major depressive disorder, single episode, unspecified: Secondary | ICD-10-CM | POA: Diagnosis not present

## 2020-02-28 DIAGNOSIS — I509 Heart failure, unspecified: Secondary | ICD-10-CM | POA: Diagnosis not present

## 2020-02-28 DIAGNOSIS — E119 Type 2 diabetes mellitus without complications: Secondary | ICD-10-CM | POA: Diagnosis not present

## 2020-03-02 DIAGNOSIS — Z1159 Encounter for screening for other viral diseases: Secondary | ICD-10-CM | POA: Diagnosis not present

## 2020-03-02 DIAGNOSIS — Z20828 Contact with and (suspected) exposure to other viral communicable diseases: Secondary | ICD-10-CM | POA: Diagnosis not present

## 2020-03-06 DIAGNOSIS — Z1159 Encounter for screening for other viral diseases: Secondary | ICD-10-CM | POA: Diagnosis not present

## 2020-03-06 DIAGNOSIS — Z20828 Contact with and (suspected) exposure to other viral communicable diseases: Secondary | ICD-10-CM | POA: Diagnosis not present

## 2020-03-07 DIAGNOSIS — I110 Hypertensive heart disease with heart failure: Secondary | ICD-10-CM | POA: Diagnosis not present

## 2020-03-07 DIAGNOSIS — F329 Major depressive disorder, single episode, unspecified: Secondary | ICD-10-CM | POA: Diagnosis not present

## 2020-03-07 DIAGNOSIS — I509 Heart failure, unspecified: Secondary | ICD-10-CM | POA: Diagnosis not present

## 2020-03-07 DIAGNOSIS — M79662 Pain in left lower leg: Secondary | ICD-10-CM | POA: Diagnosis not present

## 2020-03-07 DIAGNOSIS — E119 Type 2 diabetes mellitus without complications: Secondary | ICD-10-CM | POA: Diagnosis not present

## 2020-03-07 DIAGNOSIS — M79661 Pain in right lower leg: Secondary | ICD-10-CM | POA: Diagnosis not present

## 2020-03-07 DIAGNOSIS — G8929 Other chronic pain: Secondary | ICD-10-CM | POA: Diagnosis not present

## 2020-03-07 DIAGNOSIS — M545 Low back pain: Secondary | ICD-10-CM | POA: Diagnosis not present

## 2020-03-07 DIAGNOSIS — I4891 Unspecified atrial fibrillation: Secondary | ICD-10-CM | POA: Diagnosis not present

## 2020-03-09 DIAGNOSIS — Z20828 Contact with and (suspected) exposure to other viral communicable diseases: Secondary | ICD-10-CM | POA: Diagnosis not present

## 2020-03-09 DIAGNOSIS — Z1159 Encounter for screening for other viral diseases: Secondary | ICD-10-CM | POA: Diagnosis not present

## 2020-03-13 DIAGNOSIS — G8929 Other chronic pain: Secondary | ICD-10-CM | POA: Diagnosis not present

## 2020-03-13 DIAGNOSIS — F329 Major depressive disorder, single episode, unspecified: Secondary | ICD-10-CM | POA: Diagnosis not present

## 2020-03-13 DIAGNOSIS — I509 Heart failure, unspecified: Secondary | ICD-10-CM | POA: Diagnosis not present

## 2020-03-13 DIAGNOSIS — Z1159 Encounter for screening for other viral diseases: Secondary | ICD-10-CM | POA: Diagnosis not present

## 2020-03-13 DIAGNOSIS — Z20828 Contact with and (suspected) exposure to other viral communicable diseases: Secondary | ICD-10-CM | POA: Diagnosis not present

## 2020-03-13 DIAGNOSIS — I110 Hypertensive heart disease with heart failure: Secondary | ICD-10-CM | POA: Diagnosis not present

## 2020-03-13 DIAGNOSIS — E119 Type 2 diabetes mellitus without complications: Secondary | ICD-10-CM | POA: Diagnosis not present

## 2020-03-13 DIAGNOSIS — M79661 Pain in right lower leg: Secondary | ICD-10-CM | POA: Diagnosis not present

## 2020-03-13 DIAGNOSIS — I4891 Unspecified atrial fibrillation: Secondary | ICD-10-CM | POA: Diagnosis not present

## 2020-03-13 DIAGNOSIS — M545 Low back pain: Secondary | ICD-10-CM | POA: Diagnosis not present

## 2020-03-13 DIAGNOSIS — M79662 Pain in left lower leg: Secondary | ICD-10-CM | POA: Diagnosis not present

## 2020-03-16 DIAGNOSIS — Z1159 Encounter for screening for other viral diseases: Secondary | ICD-10-CM | POA: Diagnosis not present

## 2020-03-16 DIAGNOSIS — Z20828 Contact with and (suspected) exposure to other viral communicable diseases: Secondary | ICD-10-CM | POA: Diagnosis not present

## 2020-03-17 ENCOUNTER — Non-Acute Institutional Stay: Payer: Medicare HMO | Admitting: Hospice

## 2020-03-17 ENCOUNTER — Other Ambulatory Visit: Payer: Self-pay

## 2020-03-17 DIAGNOSIS — I509 Heart failure, unspecified: Secondary | ICD-10-CM | POA: Diagnosis not present

## 2020-03-17 DIAGNOSIS — Z515 Encounter for palliative care: Secondary | ICD-10-CM

## 2020-03-17 NOTE — Progress Notes (Signed)
[facility] Consult Note Telephone: [phone]  Fax: [phone]  PATIENT NAME: [name] DOB: [birth year] MRN: [id number]  PRIMARY CARE PROVIDER:   Mayra Neer, MD  REFERRING PROVIDER:  Mayra Neer, MD [address] [place],  [place] [address]  RESPONSIBLE PARTY:Self Contact: [place] [name] - Niece. POA [phone]  .  RECOMMENDATIONS/PLAN:   Advance Care Planning/Goals of Care: Visit consisted of building trust and follow-up on palliative care.  Patient remains a DO NOT RESUSCITATE.  Goals of care include to maximize quality of life and symptom management.  Symptom management: Patient is currently on physical therapy for increasing strength and ability to walk.  She continues to use her rolling walker for support.  She denied any pain or discomfort during visit and admitted that she naps a little more in the afternoons than before.  Patient in no acute distress.  She also reported that sometimes she has constipation but when they give her MiraLAX, it is always very helpful.  Appetite is good and no reported weight loss at this time.  She is compliant with her medications; no changes to her medications since last visit.  Nursing staff Monique with no concerns or complaints at this time.   Follow up: Palliative care will continue to follow patient for goals of care clarification and symptom management. I spent 48 minutes providing this consultation; time includes chart review and documentation. More than 50% of the time in this consultation was spent on coordinating communication  Kristen Glowacki Rickettsis a 86 y.o.year oldfemalewith multiple medical problems including HTN Afib, CHF, GERD. Palliative Care was asked to help address goals of care.  CODE STATUS: DNR  PPS: 50% HOSPICE ELIGIBILITY/DIAGNOSIS: TBD  PAST MEDICAL HISTORY:  Past Medical History:  Diagnosis Date  . Atrial fibrillation ([place])     . Cholelithiasis   . Constipation   . Diabetes mellitus   . Dizziness    with some gait disability  . GERD (gastroesophageal reflux disease)   . Hepatic cyst   . Hyperlipidemia   . Hypertension   . Ischemic cardiomyopathy   . LBBB (left bundle branch block)   . Macular degeneration   . Other postprocedural status(V45.89)   . Peripheral edema     SOCIAL HX:  Social History   Tobacco Use  . Smoking status: Passive Smoke Exposure - Never Smoker  . Smokeless tobacco: Never Used  . 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 Alcohol use: No    ALLERGIES:  Allergies  Allergen Reactions  . Fosamax [Alendronate Sodium] Swelling  . Penicillins Swelling and Other (See Comments)    Swelling, redness and warmth at injection site Has patient had a PCN reaction causing immediate rash, facial/tongue/throat swelling, SOB or lightheadedness with hypotension: No Has patient had a PCN reaction causing severe rash involving mucus membranes or skin necrosis: No Has patient had a PCN reaction that required hospitalization: No Has patient had a PCN reaction occurring within the last 10 years: No If all of the above answers are "NO", then may proceed with Cephalosporin use.  . Sulfonamide Derivatives Swelling  . Tetanus Toxoid Swelling     PERTINENT MEDICATIONS:  Outpatient Encounter Medications as of 03/17/2020  Medication Sig  . acetaminophen (TYLENOL) 325 MG tablet Take 325 mg by mouth every 6 (six) hours as needed (pain).  Marland Kitchen acetaminophen (TYLENOL) 500 MG tablet Take 500 mg by mouth every  evening.  Marland Kitchen aspirin 81 MG chewable tablet Chew 81 mg by mouth daily.   . benzonatate (TESSALON) 100 MG capsule Take 100 mg by mouth 3 (three) times daily as needed for cough.  . carvedilol (COREG) 6.25 MG tablet Take 6.25 mg by mouth 2 (two) times daily.  . colestipol (COLESTID) 1 g tablet Take 2 tablets (2 g total) by mouth daily. Hold FOR CONSTIPATION  (Patient not taking: Reported on 07/02/2019)  . diazepam (VALIUM) 5 MG tablet Take 2.5-5 mg by mouth See admin instructions. Take 2.5mg  by mouth in the morning and take 5mg  by mouth before bedtime  . guaifenesin (ROBITUSSIN) 100 MG/5ML syrup Take 200 mg by mouth every 8 (eight) hours as needed for cough.  . lactobacillus acidophilus & bulgar (LACTINEX) chewable tablet Chew 1 tablet by mouth daily.  levothyroxine (SYNTHROID, LEVOTHROID) 25 MCG tablet Take 25 mcg by mouth daily before breakfast.  . loperamide (IMODIUM A-D) 2 MG tablet Take 2-4 mg by mouth See admin instructions. Take 2 tablets (4 mg) by mouth as needed after 1st loose stool followed by 1 tablet (2 mg) after each subsequent loose stool.  . magnesium hydroxide (MILK OF MAGNESIA) 400 MG/5ML suspension Take 15 mLs by mouth daily as needed (constipation).   . mirtazapine (REMERON) 30 MG tablet Take 30 mg by mouth at bedtime.   . Multiple Vitamins-Minerals (PRESERVISION AREDS PO) Take 1 tablet by mouth daily.   Marland Kitchen omeprazole (PRILOSEC) 20 MG capsule Take 20 mg by mouth daily.  . ondansetron (ZOFRAN) 4 MG tablet Take 4 mg by mouth daily as needed for nausea.   . sertraline (ZOLOFT) 100 MG tablet Take 100 mg by mouth daily.  . simvastatin (ZOCOR) 40 MG tablet Take 40 mg by mouth daily.  Marland Kitchen spironolactone (ALDACTONE) 25 MG tablet Take 0.5 tablets (12.5 mg total) by mouth daily.  Marland Kitchen torsemide (DEMADEX) 20 MG tablet Take 20 mg by mouth daily.   No facility-administered encounter medications on file as of 03/17/2020.    PHYSICAL EXAM/ROS  General: NAD, cooperative Cardiovascular: regular rate and rhythm; denies chest pain Pulmonary: clear ant/post fields; normal respiratory effort, no coughing no shortness of breath Abdomen: soft, nontender, + bowel sounds GU: no suprapubic tenderness Extremities: no edema, no joint deformities Skin: no rashes to exposed skin Neurological: Weakness but otherwise nonfocal  03/19/2020, NP

## 2020-03-20 DIAGNOSIS — Z1159 Encounter for screening for other viral diseases: Secondary | ICD-10-CM | POA: Diagnosis not present

## 2020-03-20 DIAGNOSIS — Z20828 Contact with and (suspected) exposure to other viral communicable diseases: Secondary | ICD-10-CM | POA: Diagnosis not present

## 2020-03-23 DIAGNOSIS — M79662 Pain in left lower leg: Secondary | ICD-10-CM | POA: Diagnosis not present

## 2020-03-23 DIAGNOSIS — I509 Heart failure, unspecified: Secondary | ICD-10-CM | POA: Diagnosis not present

## 2020-03-23 DIAGNOSIS — I4891 Unspecified atrial fibrillation: Secondary | ICD-10-CM | POA: Diagnosis not present

## 2020-03-23 DIAGNOSIS — Z1159 Encounter for screening for other viral diseases: Secondary | ICD-10-CM | POA: Diagnosis not present

## 2020-03-23 DIAGNOSIS — G8929 Other chronic pain: Secondary | ICD-10-CM | POA: Diagnosis not present

## 2020-03-23 DIAGNOSIS — F329 Major depressive disorder, single episode, unspecified: Secondary | ICD-10-CM | POA: Diagnosis not present

## 2020-03-23 DIAGNOSIS — M79661 Pain in right lower leg: Secondary | ICD-10-CM | POA: Diagnosis not present

## 2020-03-23 DIAGNOSIS — M545 Low back pain: Secondary | ICD-10-CM | POA: Diagnosis not present

## 2020-03-23 DIAGNOSIS — Z20828 Contact with and (suspected) exposure to other viral communicable diseases: Secondary | ICD-10-CM | POA: Diagnosis not present

## 2020-03-23 DIAGNOSIS — I110 Hypertensive heart disease with heart failure: Secondary | ICD-10-CM | POA: Diagnosis not present

## 2020-03-23 DIAGNOSIS — E119 Type 2 diabetes mellitus without complications: Secondary | ICD-10-CM | POA: Diagnosis not present

## 2020-03-27 DIAGNOSIS — Z20828 Contact with and (suspected) exposure to other viral communicable diseases: Secondary | ICD-10-CM | POA: Diagnosis not present

## 2020-03-27 DIAGNOSIS — Z1159 Encounter for screening for other viral diseases: Secondary | ICD-10-CM | POA: Diagnosis not present

## 2020-03-28 DIAGNOSIS — I130 Hypertensive heart and chronic kidney disease with heart failure and stage 1 through stage 4 chronic kidney disease, or unspecified chronic kidney disease: Secondary | ICD-10-CM | POA: Diagnosis not present

## 2020-03-28 DIAGNOSIS — E1142 Type 2 diabetes mellitus with diabetic polyneuropathy: Secondary | ICD-10-CM | POA: Diagnosis not present

## 2020-03-28 DIAGNOSIS — I429 Cardiomyopathy, unspecified: Secondary | ICD-10-CM | POA: Diagnosis not present

## 2020-03-28 DIAGNOSIS — I480 Paroxysmal atrial fibrillation: Secondary | ICD-10-CM | POA: Diagnosis not present

## 2020-03-28 DIAGNOSIS — N183 Chronic kidney disease, stage 3 unspecified: Secondary | ICD-10-CM | POA: Diagnosis not present

## 2020-03-28 DIAGNOSIS — Z0000 Encounter for general adult medical examination without abnormal findings: Secondary | ICD-10-CM | POA: Diagnosis not present

## 2020-03-28 DIAGNOSIS — I5022 Chronic systolic (congestive) heart failure: Secondary | ICD-10-CM | POA: Diagnosis not present

## 2020-03-28 DIAGNOSIS — E782 Mixed hyperlipidemia: Secondary | ICD-10-CM | POA: Diagnosis not present

## 2020-03-28 DIAGNOSIS — M810 Age-related osteoporosis without current pathological fracture: Secondary | ICD-10-CM | POA: Diagnosis not present

## 2020-03-28 DIAGNOSIS — E039 Hypothyroidism, unspecified: Secondary | ICD-10-CM | POA: Diagnosis not present

## 2020-03-29 DIAGNOSIS — R2681 Unsteadiness on feet: Secondary | ICD-10-CM | POA: Diagnosis not present

## 2020-03-29 DIAGNOSIS — H5410 Blindness, one eye, low vision other eye, unspecified eyes: Secondary | ICD-10-CM | POA: Diagnosis not present

## 2020-03-29 DIAGNOSIS — M545 Low back pain: Secondary | ICD-10-CM | POA: Diagnosis not present

## 2020-03-29 DIAGNOSIS — H541224 Low vision right eye category 2, blindness left eye category 4: Secondary | ICD-10-CM | POA: Diagnosis not present

## 2020-03-29 DIAGNOSIS — Z9181 History of falling: Secondary | ICD-10-CM | POA: Diagnosis not present

## 2020-03-29 DIAGNOSIS — M25552 Pain in left hip: Secondary | ICD-10-CM | POA: Diagnosis not present

## 2020-03-29 DIAGNOSIS — M6281 Muscle weakness (generalized): Secondary | ICD-10-CM | POA: Diagnosis not present

## 2020-03-30 DIAGNOSIS — Z20828 Contact with and (suspected) exposure to other viral communicable diseases: Secondary | ICD-10-CM | POA: Diagnosis not present

## 2020-03-30 DIAGNOSIS — Z1159 Encounter for screening for other viral diseases: Secondary | ICD-10-CM | POA: Diagnosis not present

## 2020-03-31 DIAGNOSIS — M545 Low back pain: Secondary | ICD-10-CM | POA: Diagnosis not present

## 2020-03-31 DIAGNOSIS — R2681 Unsteadiness on feet: Secondary | ICD-10-CM | POA: Diagnosis not present

## 2020-03-31 DIAGNOSIS — H541224 Low vision right eye category 2, blindness left eye category 4: Secondary | ICD-10-CM | POA: Diagnosis not present

## 2020-03-31 DIAGNOSIS — H5410 Blindness, one eye, low vision other eye, unspecified eyes: Secondary | ICD-10-CM | POA: Diagnosis not present

## 2020-03-31 DIAGNOSIS — Z9181 History of falling: Secondary | ICD-10-CM | POA: Diagnosis not present

## 2020-03-31 DIAGNOSIS — M25552 Pain in left hip: Secondary | ICD-10-CM | POA: Diagnosis not present

## 2020-03-31 DIAGNOSIS — M6281 Muscle weakness (generalized): Secondary | ICD-10-CM | POA: Diagnosis not present

## 2020-04-03 DIAGNOSIS — H541224 Low vision right eye category 2, blindness left eye category 4: Secondary | ICD-10-CM | POA: Diagnosis not present

## 2020-04-03 DIAGNOSIS — Z1159 Encounter for screening for other viral diseases: Secondary | ICD-10-CM | POA: Diagnosis not present

## 2020-04-03 DIAGNOSIS — M25552 Pain in left hip: Secondary | ICD-10-CM | POA: Diagnosis not present

## 2020-04-03 DIAGNOSIS — Z9181 History of falling: Secondary | ICD-10-CM | POA: Diagnosis not present

## 2020-04-03 DIAGNOSIS — R2681 Unsteadiness on feet: Secondary | ICD-10-CM | POA: Diagnosis not present

## 2020-04-03 DIAGNOSIS — Z20828 Contact with and (suspected) exposure to other viral communicable diseases: Secondary | ICD-10-CM | POA: Diagnosis not present

## 2020-04-03 DIAGNOSIS — M6281 Muscle weakness (generalized): Secondary | ICD-10-CM | POA: Diagnosis not present

## 2020-04-03 DIAGNOSIS — M545 Low back pain: Secondary | ICD-10-CM | POA: Diagnosis not present

## 2020-04-03 DIAGNOSIS — H5410 Blindness, one eye, low vision other eye, unspecified eyes: Secondary | ICD-10-CM | POA: Diagnosis not present

## 2020-04-04 DIAGNOSIS — H541224 Low vision right eye category 2, blindness left eye category 4: Secondary | ICD-10-CM | POA: Diagnosis not present

## 2020-04-04 DIAGNOSIS — M545 Low back pain: Secondary | ICD-10-CM | POA: Diagnosis not present

## 2020-04-04 DIAGNOSIS — Z9181 History of falling: Secondary | ICD-10-CM | POA: Diagnosis not present

## 2020-04-04 DIAGNOSIS — R2681 Unsteadiness on feet: Secondary | ICD-10-CM | POA: Diagnosis not present

## 2020-04-04 DIAGNOSIS — H5410 Blindness, one eye, low vision other eye, unspecified eyes: Secondary | ICD-10-CM | POA: Diagnosis not present

## 2020-04-04 DIAGNOSIS — M25552 Pain in left hip: Secondary | ICD-10-CM | POA: Diagnosis not present

## 2020-04-04 DIAGNOSIS — M6281 Muscle weakness (generalized): Secondary | ICD-10-CM | POA: Diagnosis not present

## 2020-04-05 DIAGNOSIS — M6281 Muscle weakness (generalized): Secondary | ICD-10-CM | POA: Diagnosis not present

## 2020-04-05 DIAGNOSIS — H541224 Low vision right eye category 2, blindness left eye category 4: Secondary | ICD-10-CM | POA: Diagnosis not present

## 2020-04-05 DIAGNOSIS — R2681 Unsteadiness on feet: Secondary | ICD-10-CM | POA: Diagnosis not present

## 2020-04-05 DIAGNOSIS — H5410 Blindness, one eye, low vision other eye, unspecified eyes: Secondary | ICD-10-CM | POA: Diagnosis not present

## 2020-04-05 DIAGNOSIS — Z9181 History of falling: Secondary | ICD-10-CM | POA: Diagnosis not present

## 2020-04-05 DIAGNOSIS — M25552 Pain in left hip: Secondary | ICD-10-CM | POA: Diagnosis not present

## 2020-04-05 DIAGNOSIS — M545 Low back pain: Secondary | ICD-10-CM | POA: Diagnosis not present

## 2020-04-06 DIAGNOSIS — Z20828 Contact with and (suspected) exposure to other viral communicable diseases: Secondary | ICD-10-CM | POA: Diagnosis not present

## 2020-04-06 DIAGNOSIS — M25552 Pain in left hip: Secondary | ICD-10-CM | POA: Diagnosis not present

## 2020-04-06 DIAGNOSIS — R2681 Unsteadiness on feet: Secondary | ICD-10-CM | POA: Diagnosis not present

## 2020-04-06 DIAGNOSIS — H541224 Low vision right eye category 2, blindness left eye category 4: Secondary | ICD-10-CM | POA: Diagnosis not present

## 2020-04-06 DIAGNOSIS — H5410 Blindness, one eye, low vision other eye, unspecified eyes: Secondary | ICD-10-CM | POA: Diagnosis not present

## 2020-04-06 DIAGNOSIS — Z9181 History of falling: Secondary | ICD-10-CM | POA: Diagnosis not present

## 2020-04-06 DIAGNOSIS — Z1159 Encounter for screening for other viral diseases: Secondary | ICD-10-CM | POA: Diagnosis not present

## 2020-04-06 DIAGNOSIS — M545 Low back pain: Secondary | ICD-10-CM | POA: Diagnosis not present

## 2020-04-06 DIAGNOSIS — M6281 Muscle weakness (generalized): Secondary | ICD-10-CM | POA: Diagnosis not present

## 2020-04-10 DIAGNOSIS — M6281 Muscle weakness (generalized): Secondary | ICD-10-CM | POA: Diagnosis not present

## 2020-04-10 DIAGNOSIS — Z20828 Contact with and (suspected) exposure to other viral communicable diseases: Secondary | ICD-10-CM | POA: Diagnosis not present

## 2020-04-10 DIAGNOSIS — H5410 Blindness, one eye, low vision other eye, unspecified eyes: Secondary | ICD-10-CM | POA: Diagnosis not present

## 2020-04-10 DIAGNOSIS — H541224 Low vision right eye category 2, blindness left eye category 4: Secondary | ICD-10-CM | POA: Diagnosis not present

## 2020-04-10 DIAGNOSIS — R2681 Unsteadiness on feet: Secondary | ICD-10-CM | POA: Diagnosis not present

## 2020-04-10 DIAGNOSIS — M25552 Pain in left hip: Secondary | ICD-10-CM | POA: Diagnosis not present

## 2020-04-10 DIAGNOSIS — Z1159 Encounter for screening for other viral diseases: Secondary | ICD-10-CM | POA: Diagnosis not present

## 2020-04-10 DIAGNOSIS — Z9181 History of falling: Secondary | ICD-10-CM | POA: Diagnosis not present

## 2020-04-10 DIAGNOSIS — M545 Low back pain: Secondary | ICD-10-CM | POA: Diagnosis not present

## 2020-04-11 ENCOUNTER — Telehealth: Payer: Self-pay

## 2020-04-11 DIAGNOSIS — H541224 Low vision right eye category 2, blindness left eye category 4: Secondary | ICD-10-CM | POA: Diagnosis not present

## 2020-04-11 DIAGNOSIS — M25552 Pain in left hip: Secondary | ICD-10-CM | POA: Diagnosis not present

## 2020-04-11 DIAGNOSIS — R2681 Unsteadiness on feet: Secondary | ICD-10-CM | POA: Diagnosis not present

## 2020-04-11 DIAGNOSIS — H5410 Blindness, one eye, low vision other eye, unspecified eyes: Secondary | ICD-10-CM | POA: Diagnosis not present

## 2020-04-11 DIAGNOSIS — M6281 Muscle weakness (generalized): Secondary | ICD-10-CM | POA: Diagnosis not present

## 2020-04-11 DIAGNOSIS — Z9181 History of falling: Secondary | ICD-10-CM | POA: Diagnosis not present

## 2020-04-11 DIAGNOSIS — M545 Low back pain: Secondary | ICD-10-CM | POA: Diagnosis not present

## 2020-04-11 NOTE — Telephone Encounter (Signed)
Sent fax back to [facility] with Dr. Fabio Bering advisement.

## 2020-04-11 NOTE — Telephone Encounter (Signed)
An exam by her primary physician or physician that staffs at [facility]

## 2020-04-11 NOTE — Telephone Encounter (Signed)
Received fax form [facility]/ [facility] stating the following message.  "Resident complained that her left side (abdomen) has been palsating as if it has a heart beat. She did not complain of any pain, but it has been occurring on and off for a while now. Please, what do you advise?"   Left message for [facility] to call back. Patient is over due for an appointment. Will schedule when they call back.

## 2020-04-12 DIAGNOSIS — E1142 Type 2 diabetes mellitus with diabetic polyneuropathy: Secondary | ICD-10-CM | POA: Diagnosis not present

## 2020-04-12 DIAGNOSIS — H541224 Low vision right eye category 2, blindness left eye category 4: Secondary | ICD-10-CM | POA: Diagnosis not present

## 2020-04-12 DIAGNOSIS — M810 Age-related osteoporosis without current pathological fracture: Secondary | ICD-10-CM | POA: Diagnosis not present

## 2020-04-12 DIAGNOSIS — N183 Chronic kidney disease, stage 3 unspecified: Secondary | ICD-10-CM | POA: Diagnosis not present

## 2020-04-12 DIAGNOSIS — M545 Low back pain: Secondary | ICD-10-CM | POA: Diagnosis not present

## 2020-04-12 DIAGNOSIS — H5410 Blindness, one eye, low vision other eye, unspecified eyes: Secondary | ICD-10-CM | POA: Diagnosis not present

## 2020-04-12 DIAGNOSIS — I5022 Chronic systolic (congestive) heart failure: Secondary | ICD-10-CM | POA: Diagnosis not present

## 2020-04-12 DIAGNOSIS — J45909 Unspecified asthma, uncomplicated: Secondary | ICD-10-CM | POA: Diagnosis not present

## 2020-04-12 DIAGNOSIS — R2681 Unsteadiness on feet: Secondary | ICD-10-CM | POA: Diagnosis not present

## 2020-04-12 DIAGNOSIS — Z9181 History of falling: Secondary | ICD-10-CM | POA: Diagnosis not present

## 2020-04-12 DIAGNOSIS — M6281 Muscle weakness (generalized): Secondary | ICD-10-CM | POA: Diagnosis not present

## 2020-04-12 DIAGNOSIS — M25552 Pain in left hip: Secondary | ICD-10-CM | POA: Diagnosis not present

## 2020-04-12 DIAGNOSIS — R296 Repeated falls: Secondary | ICD-10-CM | POA: Diagnosis not present

## 2020-04-12 DIAGNOSIS — E1122 Type 2 diabetes mellitus with diabetic chronic kidney disease: Secondary | ICD-10-CM | POA: Diagnosis not present

## 2020-04-13 DIAGNOSIS — R2681 Unsteadiness on feet: Secondary | ICD-10-CM | POA: Diagnosis not present

## 2020-04-13 DIAGNOSIS — Z9181 History of falling: Secondary | ICD-10-CM | POA: Diagnosis not present

## 2020-04-13 DIAGNOSIS — H5410 Blindness, one eye, low vision other eye, unspecified eyes: Secondary | ICD-10-CM | POA: Diagnosis not present

## 2020-04-13 DIAGNOSIS — Z1159 Encounter for screening for other viral diseases: Secondary | ICD-10-CM | POA: Diagnosis not present

## 2020-04-13 DIAGNOSIS — H541224 Low vision right eye category 2, blindness left eye category 4: Secondary | ICD-10-CM | POA: Diagnosis not present

## 2020-04-13 DIAGNOSIS — M25552 Pain in left hip: Secondary | ICD-10-CM | POA: Diagnosis not present

## 2020-04-13 DIAGNOSIS — Z20828 Contact with and (suspected) exposure to other viral communicable diseases: Secondary | ICD-10-CM | POA: Diagnosis not present

## 2020-04-13 DIAGNOSIS — M6281 Muscle weakness (generalized): Secondary | ICD-10-CM | POA: Diagnosis not present

## 2020-04-13 DIAGNOSIS — M545 Low back pain: Secondary | ICD-10-CM | POA: Diagnosis not present

## 2020-04-14 DIAGNOSIS — R2681 Unsteadiness on feet: Secondary | ICD-10-CM | POA: Diagnosis not present

## 2020-04-14 DIAGNOSIS — H5410 Blindness, one eye, low vision other eye, unspecified eyes: Secondary | ICD-10-CM | POA: Diagnosis not present

## 2020-04-14 DIAGNOSIS — M25552 Pain in left hip: Secondary | ICD-10-CM | POA: Diagnosis not present

## 2020-04-14 DIAGNOSIS — H541224 Low vision right eye category 2, blindness left eye category 4: Secondary | ICD-10-CM | POA: Diagnosis not present

## 2020-04-14 DIAGNOSIS — Z9181 History of falling: Secondary | ICD-10-CM | POA: Diagnosis not present

## 2020-04-14 DIAGNOSIS — M545 Low back pain: Secondary | ICD-10-CM | POA: Diagnosis not present

## 2020-04-14 DIAGNOSIS — M6281 Muscle weakness (generalized): Secondary | ICD-10-CM | POA: Diagnosis not present

## 2020-04-17 DIAGNOSIS — Z1159 Encounter for screening for other viral diseases: Secondary | ICD-10-CM | POA: Diagnosis not present

## 2020-04-17 DIAGNOSIS — Z20828 Contact with and (suspected) exposure to other viral communicable diseases: Secondary | ICD-10-CM | POA: Diagnosis not present

## 2020-04-18 DIAGNOSIS — H5410 Blindness, one eye, low vision other eye, unspecified eyes: Secondary | ICD-10-CM | POA: Diagnosis not present

## 2020-04-18 DIAGNOSIS — M6281 Muscle weakness (generalized): Secondary | ICD-10-CM | POA: Diagnosis not present

## 2020-04-18 DIAGNOSIS — M545 Low back pain: Secondary | ICD-10-CM | POA: Diagnosis not present

## 2020-04-18 DIAGNOSIS — M25552 Pain in left hip: Secondary | ICD-10-CM | POA: Diagnosis not present

## 2020-04-18 DIAGNOSIS — R2681 Unsteadiness on feet: Secondary | ICD-10-CM | POA: Diagnosis not present

## 2020-04-18 DIAGNOSIS — Z9181 History of falling: Secondary | ICD-10-CM | POA: Diagnosis not present

## 2020-04-18 DIAGNOSIS — H541224 Low vision right eye category 2, blindness left eye category 4: Secondary | ICD-10-CM | POA: Diagnosis not present

## 2020-04-20 DIAGNOSIS — H541224 Low vision right eye category 2, blindness left eye category 4: Secondary | ICD-10-CM | POA: Diagnosis not present

## 2020-04-20 DIAGNOSIS — R2681 Unsteadiness on feet: Secondary | ICD-10-CM | POA: Diagnosis not present

## 2020-04-20 DIAGNOSIS — Z20828 Contact with and (suspected) exposure to other viral communicable diseases: Secondary | ICD-10-CM | POA: Diagnosis not present

## 2020-04-20 DIAGNOSIS — M6281 Muscle weakness (generalized): Secondary | ICD-10-CM | POA: Diagnosis not present

## 2020-04-20 DIAGNOSIS — H5410 Blindness, one eye, low vision other eye, unspecified eyes: Secondary | ICD-10-CM | POA: Diagnosis not present

## 2020-04-20 DIAGNOSIS — M25552 Pain in left hip: Secondary | ICD-10-CM | POA: Diagnosis not present

## 2020-04-20 DIAGNOSIS — Z9181 History of falling: Secondary | ICD-10-CM | POA: Diagnosis not present

## 2020-04-20 DIAGNOSIS — Z1159 Encounter for screening for other viral diseases: Secondary | ICD-10-CM | POA: Diagnosis not present

## 2020-04-20 DIAGNOSIS — M545 Low back pain: Secondary | ICD-10-CM | POA: Diagnosis not present

## 2020-04-21 DIAGNOSIS — H541224 Low vision right eye category 2, blindness left eye category 4: Secondary | ICD-10-CM | POA: Diagnosis not present

## 2020-04-21 DIAGNOSIS — M25552 Pain in left hip: Secondary | ICD-10-CM | POA: Diagnosis not present

## 2020-04-21 DIAGNOSIS — Z9181 History of falling: Secondary | ICD-10-CM | POA: Diagnosis not present

## 2020-04-21 DIAGNOSIS — R2681 Unsteadiness on feet: Secondary | ICD-10-CM | POA: Diagnosis not present

## 2020-04-21 DIAGNOSIS — M6281 Muscle weakness (generalized): Secondary | ICD-10-CM | POA: Diagnosis not present

## 2020-04-21 DIAGNOSIS — Z1159 Encounter for screening for other viral diseases: Secondary | ICD-10-CM | POA: Diagnosis not present

## 2020-04-21 DIAGNOSIS — H5410 Blindness, one eye, low vision other eye, unspecified eyes: Secondary | ICD-10-CM | POA: Diagnosis not present

## 2020-04-21 DIAGNOSIS — Z20828 Contact with and (suspected) exposure to other viral communicable diseases: Secondary | ICD-10-CM | POA: Diagnosis not present

## 2020-04-21 DIAGNOSIS — M545 Low back pain: Secondary | ICD-10-CM | POA: Diagnosis not present

## 2020-04-24 DIAGNOSIS — Z9181 History of falling: Secondary | ICD-10-CM | POA: Diagnosis not present

## 2020-04-24 DIAGNOSIS — M6281 Muscle weakness (generalized): Secondary | ICD-10-CM | POA: Diagnosis not present

## 2020-04-24 DIAGNOSIS — R2681 Unsteadiness on feet: Secondary | ICD-10-CM | POA: Diagnosis not present

## 2020-04-24 DIAGNOSIS — M25552 Pain in left hip: Secondary | ICD-10-CM | POA: Diagnosis not present

## 2020-04-24 DIAGNOSIS — Z1159 Encounter for screening for other viral diseases: Secondary | ICD-10-CM | POA: Diagnosis not present

## 2020-04-24 DIAGNOSIS — H5410 Blindness, one eye, low vision other eye, unspecified eyes: Secondary | ICD-10-CM | POA: Diagnosis not present

## 2020-04-24 DIAGNOSIS — M545 Low back pain: Secondary | ICD-10-CM | POA: Diagnosis not present

## 2020-04-24 DIAGNOSIS — Z20828 Contact with and (suspected) exposure to other viral communicable diseases: Secondary | ICD-10-CM | POA: Diagnosis not present

## 2020-04-24 DIAGNOSIS — H541224 Low vision right eye category 2, blindness left eye category 4: Secondary | ICD-10-CM | POA: Diagnosis not present

## 2020-04-26 DIAGNOSIS — H541224 Low vision right eye category 2, blindness left eye category 4: Secondary | ICD-10-CM | POA: Diagnosis not present

## 2020-04-26 DIAGNOSIS — H5410 Blindness, one eye, low vision other eye, unspecified eyes: Secondary | ICD-10-CM | POA: Diagnosis not present

## 2020-04-26 DIAGNOSIS — Z9181 History of falling: Secondary | ICD-10-CM | POA: Diagnosis not present

## 2020-04-26 DIAGNOSIS — M545 Low back pain: Secondary | ICD-10-CM | POA: Diagnosis not present

## 2020-04-26 DIAGNOSIS — M6281 Muscle weakness (generalized): Secondary | ICD-10-CM | POA: Diagnosis not present

## 2020-04-26 DIAGNOSIS — M25552 Pain in left hip: Secondary | ICD-10-CM | POA: Diagnosis not present

## 2020-04-26 DIAGNOSIS — R2681 Unsteadiness on feet: Secondary | ICD-10-CM | POA: Diagnosis not present

## 2020-04-28 DIAGNOSIS — M6281 Muscle weakness (generalized): Secondary | ICD-10-CM | POA: Diagnosis not present

## 2020-04-28 DIAGNOSIS — Z9181 History of falling: Secondary | ICD-10-CM | POA: Diagnosis not present

## 2020-04-28 DIAGNOSIS — M25552 Pain in left hip: Secondary | ICD-10-CM | POA: Diagnosis not present

## 2020-04-28 DIAGNOSIS — R2681 Unsteadiness on feet: Secondary | ICD-10-CM | POA: Diagnosis not present

## 2020-04-28 DIAGNOSIS — H541224 Low vision right eye category 2, blindness left eye category 4: Secondary | ICD-10-CM | POA: Diagnosis not present

## 2020-04-28 DIAGNOSIS — H5410 Blindness, one eye, low vision other eye, unspecified eyes: Secondary | ICD-10-CM | POA: Diagnosis not present

## 2020-04-28 DIAGNOSIS — M545 Low back pain: Secondary | ICD-10-CM | POA: Diagnosis not present

## 2020-05-01 DIAGNOSIS — Z20828 Contact with and (suspected) exposure to other viral communicable diseases: Secondary | ICD-10-CM | POA: Diagnosis not present

## 2020-05-01 DIAGNOSIS — Z1159 Encounter for screening for other viral diseases: Secondary | ICD-10-CM | POA: Diagnosis not present

## 2020-05-02 DIAGNOSIS — H5410 Blindness, one eye, low vision other eye, unspecified eyes: Secondary | ICD-10-CM | POA: Diagnosis not present

## 2020-05-02 DIAGNOSIS — R2681 Unsteadiness on feet: Secondary | ICD-10-CM | POA: Diagnosis not present

## 2020-05-02 DIAGNOSIS — H541224 Low vision right eye category 2, blindness left eye category 4: Secondary | ICD-10-CM | POA: Diagnosis not present

## 2020-05-02 DIAGNOSIS — N390 Urinary tract infection, site not specified: Secondary | ICD-10-CM | POA: Diagnosis not present

## 2020-05-02 DIAGNOSIS — M6281 Muscle weakness (generalized): Secondary | ICD-10-CM | POA: Diagnosis not present

## 2020-05-02 DIAGNOSIS — M25552 Pain in left hip: Secondary | ICD-10-CM | POA: Diagnosis not present

## 2020-05-02 DIAGNOSIS — M545 Low back pain: Secondary | ICD-10-CM | POA: Diagnosis not present

## 2020-05-02 DIAGNOSIS — Z9181 History of falling: Secondary | ICD-10-CM | POA: Diagnosis not present

## 2020-05-03 DIAGNOSIS — M6281 Muscle weakness (generalized): Secondary | ICD-10-CM | POA: Diagnosis not present

## 2020-05-03 DIAGNOSIS — M25552 Pain in left hip: Secondary | ICD-10-CM | POA: Diagnosis not present

## 2020-05-03 DIAGNOSIS — H5410 Blindness, one eye, low vision other eye, unspecified eyes: Secondary | ICD-10-CM | POA: Diagnosis not present

## 2020-05-03 DIAGNOSIS — Z9181 History of falling: Secondary | ICD-10-CM | POA: Diagnosis not present

## 2020-05-03 DIAGNOSIS — R2681 Unsteadiness on feet: Secondary | ICD-10-CM | POA: Diagnosis not present

## 2020-05-03 DIAGNOSIS — H541224 Low vision right eye category 2, blindness left eye category 4: Secondary | ICD-10-CM | POA: Diagnosis not present

## 2020-05-03 DIAGNOSIS — M545 Low back pain: Secondary | ICD-10-CM | POA: Diagnosis not present

## 2020-05-04 DIAGNOSIS — M545 Low back pain: Secondary | ICD-10-CM | POA: Diagnosis not present

## 2020-05-04 DIAGNOSIS — H541224 Low vision right eye category 2, blindness left eye category 4: Secondary | ICD-10-CM | POA: Diagnosis not present

## 2020-05-04 DIAGNOSIS — M25552 Pain in left hip: Secondary | ICD-10-CM | POA: Diagnosis not present

## 2020-05-04 DIAGNOSIS — Z9181 History of falling: Secondary | ICD-10-CM | POA: Diagnosis not present

## 2020-05-04 DIAGNOSIS — H5410 Blindness, one eye, low vision other eye, unspecified eyes: Secondary | ICD-10-CM | POA: Diagnosis not present

## 2020-05-04 DIAGNOSIS — M6281 Muscle weakness (generalized): Secondary | ICD-10-CM | POA: Diagnosis not present

## 2020-05-04 DIAGNOSIS — R2681 Unsteadiness on feet: Secondary | ICD-10-CM | POA: Diagnosis not present

## 2020-05-05 DIAGNOSIS — H5410 Blindness, one eye, low vision other eye, unspecified eyes: Secondary | ICD-10-CM | POA: Diagnosis not present

## 2020-05-05 DIAGNOSIS — M25552 Pain in left hip: Secondary | ICD-10-CM | POA: Diagnosis not present

## 2020-05-05 DIAGNOSIS — R2681 Unsteadiness on feet: Secondary | ICD-10-CM | POA: Diagnosis not present

## 2020-05-05 DIAGNOSIS — H541224 Low vision right eye category 2, blindness left eye category 4: Secondary | ICD-10-CM | POA: Diagnosis not present

## 2020-05-05 DIAGNOSIS — Z9181 History of falling: Secondary | ICD-10-CM | POA: Diagnosis not present

## 2020-05-05 DIAGNOSIS — M545 Low back pain: Secondary | ICD-10-CM | POA: Diagnosis not present

## 2020-05-05 DIAGNOSIS — M6281 Muscle weakness (generalized): Secondary | ICD-10-CM | POA: Diagnosis not present

## 2020-05-08 DIAGNOSIS — M6281 Muscle weakness (generalized): Secondary | ICD-10-CM | POA: Diagnosis not present

## 2020-05-08 DIAGNOSIS — M545 Low back pain: Secondary | ICD-10-CM | POA: Diagnosis not present

## 2020-05-08 DIAGNOSIS — H5410 Blindness, one eye, low vision other eye, unspecified eyes: Secondary | ICD-10-CM | POA: Diagnosis not present

## 2020-05-08 DIAGNOSIS — R2681 Unsteadiness on feet: Secondary | ICD-10-CM | POA: Diagnosis not present

## 2020-05-08 DIAGNOSIS — M25552 Pain in left hip: Secondary | ICD-10-CM | POA: Diagnosis not present

## 2020-05-08 DIAGNOSIS — Z9181 History of falling: Secondary | ICD-10-CM | POA: Diagnosis not present

## 2020-05-08 DIAGNOSIS — H541224 Low vision right eye category 2, blindness left eye category 4: Secondary | ICD-10-CM | POA: Diagnosis not present

## 2020-05-10 DIAGNOSIS — M545 Low back pain: Secondary | ICD-10-CM | POA: Diagnosis not present

## 2020-05-10 DIAGNOSIS — R2681 Unsteadiness on feet: Secondary | ICD-10-CM | POA: Diagnosis not present

## 2020-05-10 DIAGNOSIS — Z9181 History of falling: Secondary | ICD-10-CM | POA: Diagnosis not present

## 2020-05-10 DIAGNOSIS — M6281 Muscle weakness (generalized): Secondary | ICD-10-CM | POA: Diagnosis not present

## 2020-05-10 DIAGNOSIS — H541224 Low vision right eye category 2, blindness left eye category 4: Secondary | ICD-10-CM | POA: Diagnosis not present

## 2020-05-10 DIAGNOSIS — H5410 Blindness, one eye, low vision other eye, unspecified eyes: Secondary | ICD-10-CM | POA: Diagnosis not present

## 2020-05-10 DIAGNOSIS — M25552 Pain in left hip: Secondary | ICD-10-CM | POA: Diagnosis not present

## 2020-05-12 DIAGNOSIS — M545 Low back pain: Secondary | ICD-10-CM | POA: Diagnosis not present

## 2020-05-12 DIAGNOSIS — M6281 Muscle weakness (generalized): Secondary | ICD-10-CM | POA: Diagnosis not present

## 2020-05-12 DIAGNOSIS — H5410 Blindness, one eye, low vision other eye, unspecified eyes: Secondary | ICD-10-CM | POA: Diagnosis not present

## 2020-05-12 DIAGNOSIS — Z9181 History of falling: Secondary | ICD-10-CM | POA: Diagnosis not present

## 2020-05-12 DIAGNOSIS — H541224 Low vision right eye category 2, blindness left eye category 4: Secondary | ICD-10-CM | POA: Diagnosis not present

## 2020-05-12 DIAGNOSIS — M25552 Pain in left hip: Secondary | ICD-10-CM | POA: Diagnosis not present

## 2020-05-12 DIAGNOSIS — R2681 Unsteadiness on feet: Secondary | ICD-10-CM | POA: Diagnosis not present

## 2020-05-13 DIAGNOSIS — R296 Repeated falls: Secondary | ICD-10-CM | POA: Diagnosis not present

## 2020-05-13 DIAGNOSIS — J45909 Unspecified asthma, uncomplicated: Secondary | ICD-10-CM | POA: Diagnosis not present

## 2020-05-13 DIAGNOSIS — N183 Chronic kidney disease, stage 3 unspecified: Secondary | ICD-10-CM | POA: Diagnosis not present

## 2020-05-13 DIAGNOSIS — E1122 Type 2 diabetes mellitus with diabetic chronic kidney disease: Secondary | ICD-10-CM | POA: Diagnosis not present

## 2020-05-13 DIAGNOSIS — I5022 Chronic systolic (congestive) heart failure: Secondary | ICD-10-CM | POA: Diagnosis not present

## 2020-05-13 DIAGNOSIS — E1142 Type 2 diabetes mellitus with diabetic polyneuropathy: Secondary | ICD-10-CM | POA: Diagnosis not present

## 2020-05-13 DIAGNOSIS — M810 Age-related osteoporosis without current pathological fracture: Secondary | ICD-10-CM | POA: Diagnosis not present

## 2020-05-15 DIAGNOSIS — H5410 Blindness, one eye, low vision other eye, unspecified eyes: Secondary | ICD-10-CM | POA: Diagnosis not present

## 2020-05-15 DIAGNOSIS — Z9181 History of falling: Secondary | ICD-10-CM | POA: Diagnosis not present

## 2020-05-15 DIAGNOSIS — M545 Low back pain: Secondary | ICD-10-CM | POA: Diagnosis not present

## 2020-05-15 DIAGNOSIS — Z1159 Encounter for screening for other viral diseases: Secondary | ICD-10-CM | POA: Diagnosis not present

## 2020-05-15 DIAGNOSIS — M6281 Muscle weakness (generalized): Secondary | ICD-10-CM | POA: Diagnosis not present

## 2020-05-15 DIAGNOSIS — H541224 Low vision right eye category 2, blindness left eye category 4: Secondary | ICD-10-CM | POA: Diagnosis not present

## 2020-05-15 DIAGNOSIS — M25552 Pain in left hip: Secondary | ICD-10-CM | POA: Diagnosis not present

## 2020-05-15 DIAGNOSIS — R2681 Unsteadiness on feet: Secondary | ICD-10-CM | POA: Diagnosis not present

## 2020-05-15 DIAGNOSIS — Z20828 Contact with and (suspected) exposure to other viral communicable diseases: Secondary | ICD-10-CM | POA: Diagnosis not present

## 2020-05-16 DIAGNOSIS — R2681 Unsteadiness on feet: Secondary | ICD-10-CM | POA: Diagnosis not present

## 2020-05-16 DIAGNOSIS — M6281 Muscle weakness (generalized): Secondary | ICD-10-CM | POA: Diagnosis not present

## 2020-05-16 DIAGNOSIS — M545 Low back pain: Secondary | ICD-10-CM | POA: Diagnosis not present

## 2020-05-16 DIAGNOSIS — H541224 Low vision right eye category 2, blindness left eye category 4: Secondary | ICD-10-CM | POA: Diagnosis not present

## 2020-05-16 DIAGNOSIS — Z9181 History of falling: Secondary | ICD-10-CM | POA: Diagnosis not present

## 2020-05-16 DIAGNOSIS — H5410 Blindness, one eye, low vision other eye, unspecified eyes: Secondary | ICD-10-CM | POA: Diagnosis not present

## 2020-05-16 DIAGNOSIS — M25552 Pain in left hip: Secondary | ICD-10-CM | POA: Diagnosis not present

## 2020-05-17 DIAGNOSIS — H5410 Blindness, one eye, low vision other eye, unspecified eyes: Secondary | ICD-10-CM | POA: Diagnosis not present

## 2020-05-17 DIAGNOSIS — M6281 Muscle weakness (generalized): Secondary | ICD-10-CM | POA: Diagnosis not present

## 2020-05-17 DIAGNOSIS — M25552 Pain in left hip: Secondary | ICD-10-CM | POA: Diagnosis not present

## 2020-05-17 DIAGNOSIS — M545 Low back pain: Secondary | ICD-10-CM | POA: Diagnosis not present

## 2020-05-17 DIAGNOSIS — R2681 Unsteadiness on feet: Secondary | ICD-10-CM | POA: Diagnosis not present

## 2020-05-17 DIAGNOSIS — H541224 Low vision right eye category 2, blindness left eye category 4: Secondary | ICD-10-CM | POA: Diagnosis not present

## 2020-05-17 DIAGNOSIS — Z9181 History of falling: Secondary | ICD-10-CM | POA: Diagnosis not present

## 2020-05-18 DIAGNOSIS — M545 Low back pain: Secondary | ICD-10-CM | POA: Diagnosis not present

## 2020-05-18 DIAGNOSIS — Z03818 Encounter for observation for suspected exposure to other biological agents ruled out: Secondary | ICD-10-CM | POA: Diagnosis not present

## 2020-05-18 DIAGNOSIS — M6281 Muscle weakness (generalized): Secondary | ICD-10-CM | POA: Diagnosis not present

## 2020-05-18 DIAGNOSIS — Z20828 Contact with and (suspected) exposure to other viral communicable diseases: Secondary | ICD-10-CM | POA: Diagnosis not present

## 2020-05-18 DIAGNOSIS — H5410 Blindness, one eye, low vision other eye, unspecified eyes: Secondary | ICD-10-CM | POA: Diagnosis not present

## 2020-05-18 DIAGNOSIS — R2681 Unsteadiness on feet: Secondary | ICD-10-CM | POA: Diagnosis not present

## 2020-05-18 DIAGNOSIS — M25552 Pain in left hip: Secondary | ICD-10-CM | POA: Diagnosis not present

## 2020-05-18 DIAGNOSIS — Z9181 History of falling: Secondary | ICD-10-CM | POA: Diagnosis not present

## 2020-05-18 DIAGNOSIS — R05 Cough: Secondary | ICD-10-CM | POA: Diagnosis not present

## 2020-05-18 DIAGNOSIS — H541224 Low vision right eye category 2, blindness left eye category 4: Secondary | ICD-10-CM | POA: Diagnosis not present

## 2020-05-18 DIAGNOSIS — Z1159 Encounter for screening for other viral diseases: Secondary | ICD-10-CM | POA: Diagnosis not present

## 2020-05-19 DIAGNOSIS — J22 Unspecified acute lower respiratory infection: Secondary | ICD-10-CM | POA: Diagnosis not present

## 2020-05-19 DIAGNOSIS — R05 Cough: Secondary | ICD-10-CM | POA: Diagnosis not present

## 2020-05-22 DIAGNOSIS — M6281 Muscle weakness (generalized): Secondary | ICD-10-CM | POA: Diagnosis not present

## 2020-05-22 DIAGNOSIS — H5410 Blindness, one eye, low vision other eye, unspecified eyes: Secondary | ICD-10-CM | POA: Diagnosis not present

## 2020-05-22 DIAGNOSIS — M545 Low back pain: Secondary | ICD-10-CM | POA: Diagnosis not present

## 2020-05-22 DIAGNOSIS — Z1159 Encounter for screening for other viral diseases: Secondary | ICD-10-CM | POA: Diagnosis not present

## 2020-05-22 DIAGNOSIS — H541224 Low vision right eye category 2, blindness left eye category 4: Secondary | ICD-10-CM | POA: Diagnosis not present

## 2020-05-22 DIAGNOSIS — Z9181 History of falling: Secondary | ICD-10-CM | POA: Diagnosis not present

## 2020-05-22 DIAGNOSIS — R2681 Unsteadiness on feet: Secondary | ICD-10-CM | POA: Diagnosis not present

## 2020-05-22 DIAGNOSIS — M25552 Pain in left hip: Secondary | ICD-10-CM | POA: Diagnosis not present

## 2020-05-22 DIAGNOSIS — Z20828 Contact with and (suspected) exposure to other viral communicable diseases: Secondary | ICD-10-CM | POA: Diagnosis not present

## 2020-05-23 DIAGNOSIS — M25552 Pain in left hip: Secondary | ICD-10-CM | POA: Diagnosis not present

## 2020-05-23 DIAGNOSIS — H541224 Low vision right eye category 2, blindness left eye category 4: Secondary | ICD-10-CM | POA: Diagnosis not present

## 2020-05-23 DIAGNOSIS — M545 Low back pain: Secondary | ICD-10-CM | POA: Diagnosis not present

## 2020-05-23 DIAGNOSIS — R2681 Unsteadiness on feet: Secondary | ICD-10-CM | POA: Diagnosis not present

## 2020-05-23 DIAGNOSIS — H5410 Blindness, one eye, low vision other eye, unspecified eyes: Secondary | ICD-10-CM | POA: Diagnosis not present

## 2020-05-23 DIAGNOSIS — Z9181 History of falling: Secondary | ICD-10-CM | POA: Diagnosis not present

## 2020-05-23 DIAGNOSIS — M6281 Muscle weakness (generalized): Secondary | ICD-10-CM | POA: Diagnosis not present

## 2020-05-24 DIAGNOSIS — Z9181 History of falling: Secondary | ICD-10-CM | POA: Diagnosis not present

## 2020-05-24 DIAGNOSIS — H541224 Low vision right eye category 2, blindness left eye category 4: Secondary | ICD-10-CM | POA: Diagnosis not present

## 2020-05-24 DIAGNOSIS — R2681 Unsteadiness on feet: Secondary | ICD-10-CM | POA: Diagnosis not present

## 2020-05-24 DIAGNOSIS — M545 Low back pain: Secondary | ICD-10-CM | POA: Diagnosis not present

## 2020-05-24 DIAGNOSIS — H5410 Blindness, one eye, low vision other eye, unspecified eyes: Secondary | ICD-10-CM | POA: Diagnosis not present

## 2020-05-24 DIAGNOSIS — M6281 Muscle weakness (generalized): Secondary | ICD-10-CM | POA: Diagnosis not present

## 2020-05-24 DIAGNOSIS — M25552 Pain in left hip: Secondary | ICD-10-CM | POA: Diagnosis not present

## 2020-05-25 DIAGNOSIS — R2681 Unsteadiness on feet: Secondary | ICD-10-CM | POA: Diagnosis not present

## 2020-05-25 DIAGNOSIS — H541224 Low vision right eye category 2, blindness left eye category 4: Secondary | ICD-10-CM | POA: Diagnosis not present

## 2020-05-25 DIAGNOSIS — M6281 Muscle weakness (generalized): Secondary | ICD-10-CM | POA: Diagnosis not present

## 2020-05-25 DIAGNOSIS — M25552 Pain in left hip: Secondary | ICD-10-CM | POA: Diagnosis not present

## 2020-05-25 DIAGNOSIS — H5410 Blindness, one eye, low vision other eye, unspecified eyes: Secondary | ICD-10-CM | POA: Diagnosis not present

## 2020-05-25 DIAGNOSIS — Z1159 Encounter for screening for other viral diseases: Secondary | ICD-10-CM | POA: Diagnosis not present

## 2020-05-25 DIAGNOSIS — Z20828 Contact with and (suspected) exposure to other viral communicable diseases: Secondary | ICD-10-CM | POA: Diagnosis not present

## 2020-05-25 DIAGNOSIS — M545 Low back pain: Secondary | ICD-10-CM | POA: Diagnosis not present

## 2020-05-25 DIAGNOSIS — Z9181 History of falling: Secondary | ICD-10-CM | POA: Diagnosis not present

## 2020-05-26 DIAGNOSIS — M6281 Muscle weakness (generalized): Secondary | ICD-10-CM | POA: Diagnosis not present

## 2020-05-26 DIAGNOSIS — M25552 Pain in left hip: Secondary | ICD-10-CM | POA: Diagnosis not present

## 2020-05-26 DIAGNOSIS — H541224 Low vision right eye category 2, blindness left eye category 4: Secondary | ICD-10-CM | POA: Diagnosis not present

## 2020-05-26 DIAGNOSIS — Z9181 History of falling: Secondary | ICD-10-CM | POA: Diagnosis not present

## 2020-05-26 DIAGNOSIS — M545 Low back pain: Secondary | ICD-10-CM | POA: Diagnosis not present

## 2020-05-26 DIAGNOSIS — H5410 Blindness, one eye, low vision other eye, unspecified eyes: Secondary | ICD-10-CM | POA: Diagnosis not present

## 2020-05-26 DIAGNOSIS — R2681 Unsteadiness on feet: Secondary | ICD-10-CM | POA: Diagnosis not present

## 2020-05-29 DIAGNOSIS — Z1159 Encounter for screening for other viral diseases: Secondary | ICD-10-CM | POA: Diagnosis not present

## 2020-05-29 DIAGNOSIS — H541224 Low vision right eye category 2, blindness left eye category 4: Secondary | ICD-10-CM | POA: Diagnosis not present

## 2020-05-29 DIAGNOSIS — M25552 Pain in left hip: Secondary | ICD-10-CM | POA: Diagnosis not present

## 2020-05-29 DIAGNOSIS — M79674 Pain in right toe(s): Secondary | ICD-10-CM | POA: Diagnosis not present

## 2020-05-29 DIAGNOSIS — R2681 Unsteadiness on feet: Secondary | ICD-10-CM | POA: Diagnosis not present

## 2020-05-29 DIAGNOSIS — M545 Low back pain: Secondary | ICD-10-CM | POA: Diagnosis not present

## 2020-05-29 DIAGNOSIS — M79675 Pain in left toe(s): Secondary | ICD-10-CM | POA: Diagnosis not present

## 2020-05-29 DIAGNOSIS — Z9181 History of falling: Secondary | ICD-10-CM | POA: Diagnosis not present

## 2020-05-29 DIAGNOSIS — Z20828 Contact with and (suspected) exposure to other viral communicable diseases: Secondary | ICD-10-CM | POA: Diagnosis not present

## 2020-05-29 DIAGNOSIS — B351 Tinea unguium: Secondary | ICD-10-CM | POA: Diagnosis not present

## 2020-05-29 DIAGNOSIS — H5410 Blindness, one eye, low vision other eye, unspecified eyes: Secondary | ICD-10-CM | POA: Diagnosis not present

## 2020-05-29 DIAGNOSIS — M6281 Muscle weakness (generalized): Secondary | ICD-10-CM | POA: Diagnosis not present

## 2020-05-30 ENCOUNTER — Non-Acute Institutional Stay: Payer: Medicare HMO | Admitting: Hospice

## 2020-05-30 ENCOUNTER — Other Ambulatory Visit: Payer: Self-pay

## 2020-05-30 DIAGNOSIS — M6281 Muscle weakness (generalized): Secondary | ICD-10-CM | POA: Diagnosis not present

## 2020-05-30 DIAGNOSIS — M25552 Pain in left hip: Secondary | ICD-10-CM | POA: Diagnosis not present

## 2020-05-30 DIAGNOSIS — Z9181 History of falling: Secondary | ICD-10-CM | POA: Diagnosis not present

## 2020-05-30 DIAGNOSIS — I509 Heart failure, unspecified: Secondary | ICD-10-CM

## 2020-05-30 DIAGNOSIS — Z515 Encounter for palliative care: Secondary | ICD-10-CM

## 2020-05-30 DIAGNOSIS — R2681 Unsteadiness on feet: Secondary | ICD-10-CM | POA: Diagnosis not present

## 2020-05-30 DIAGNOSIS — H541224 Low vision right eye category 2, blindness left eye category 4: Secondary | ICD-10-CM | POA: Diagnosis not present

## 2020-05-30 DIAGNOSIS — M545 Low back pain: Secondary | ICD-10-CM | POA: Diagnosis not present

## 2020-05-30 DIAGNOSIS — H5410 Blindness, one eye, low vision other eye, unspecified eyes: Secondary | ICD-10-CM | POA: Diagnosis not present

## 2020-05-30 NOTE — Progress Notes (Signed)
Therapist, nutritional Palliative Care Consult Note Telephone: [phone]  Fax: [phone]  PATIENT NAME: [name] DOB: Jun 15, 1923 MRN: [id number]  PRIMARY CARE PROVIDER:   [name], MD  REFERRING PROVIDER:Shaw, Rockney Ghee, MD [address] [place], [place] [address]  RESPONSIBLE PARTY:Self Contact: [place] [name] - Niece. POA [phone]  .  RECOMMENDATIONS/PLAN:   Advance Care Planning/Goals of Care: Visit consisted of building trust and follow-up on palliative care.  Patient remains a DO NOT RESUSCITATE.  Goals of care include to maximize quality of life and symptom management.  Visit consisted of counseling and education dealing with the complex and emotionally intense issues of symptom management and palliative care in the setting of serious and potentially life-threatening illness.  NP called [name] and left her a voicemail with a callback number.  Palliative care team will continue to support patient, patient's family, and medical team. Symptom management:  Patient has just completed antibiotics for pneumonia; well-tolerated.  She endorsed feeling a lot better than before; denies chest pain, coughing, shortness of breath.  Patient continues on physical therapy and occupational Therapy for increasing strength, dexterity and ability to walk.  She continues to use her rolling walker for support.  Constipation is well managed by daily MiraLAX. Appetite is good and no reported weight loss at this time.  She is compliant with her medications; no changes to her medications since last visit.  Nursing staff Monique with no concerns or complaints at this time.   Follow up: Palliative care will continue to follow patient for goals of care clarification and symptom management. I spent 48 minutes providing this consultation; time includes chart review and documentation. More than 50% of the time in this consultation was spent on  coordinating communication  Kristen Furnish Rickettsis a 86 y.o.year oldfemalewith multiple medical problems including HTN Afib, CHF, GERD. Palliative Care was asked to help address goals of care.  CODE STATUS: DNR  PPS: 50% HOSPICE ELIGIBILITY/DIAGNOSIS: TBD  PAST MEDICAL HISTORY:  Past Medical History:  Diagnosis Date  . Atrial fibrillation (HCC)   . Cholelithiasis   . Constipation   . Diabetes mellitus   . Dizziness    with some gait disability  . GERD (gastroesophageal reflux disease)   . Hepatic cyst   . Hyperlipidemia   . Hypertension   . Ischemic cardiomyopathy   . LBBB (left bundle branch block)   . Macular degeneration   . Other postprocedural status(V45.89)   . Peripheral edema     SOCIAL HX:  Social History   Tobacco Use  . Smoking status: Passive Smoke Exposure - Never Smoker  . Smokeless tobacco: Never Used  . 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 Alcohol use: No    ALLERGIES:  Allergies  Allergen Reactions  . Fosamax [Alendronate Sodium] Swelling  . Penicillins Swelling and Other (See Comments)    Swelling, redness and warmth at injection site Has patient had a PCN reaction causing immediate rash, facial/tongue/throat swelling, SOB or lightheadedness with hypotension: No Has patient had a PCN reaction causing severe rash involving mucus membranes or skin necrosis: No Has patient had a PCN reaction that required hospitalization: No Has patient had a PCN reaction occurring within the last 10 years: No If all of the above answers are "NO", then may proceed with Cephalosporin use.  . Sulfonamide Derivatives Swelling  . Tetanus Toxoid Swelling     PERTINENT MEDICATIONS:  Outpatient  Encounter Medications as of 05/30/2020  Medication Sig  . acetaminophen (TYLENOL) 325 MG tablet Take 325 mg by mouth every 6 (six) hours as needed (pain).  Marland Kitchen acetaminophen (TYLENOL) 500 MG tablet Take 500 mg by mouth  every evening.  Marland Kitchen aspirin 81 MG chewable tablet Chew 81 mg by mouth daily.   . benzonatate (TESSALON) 100 MG capsule Take 100 mg by mouth 3 (three) times daily as needed for cough.  . carvedilol (COREG) 6.25 MG tablet Take 6.25 mg by mouth 2 (two) times daily.  . colestipol (COLESTID) 1 g tablet Take 2 tablets (2 g total) by mouth daily. Hold FOR CONSTIPATION (Patient not taking: Reported on 07/02/2019)  . diazepam (VALIUM) 5 MG tablet Take 2.5-5 mg by mouth See admin instructions. Take 2.5mg  by mouth in the morning and take 5mg  by mouth before bedtime  . guaifenesin (ROBITUSSIN) 100 MG/5ML syrup Take 200 mg by mouth every 8 (eight) hours as needed for cough.  . lactobacillus acidophilus & bulgar (LACTINEX) chewable tablet Chew 1 tablet by mouth daily.  levothyroxine (SYNTHROID, LEVOTHROID) 25 MCG tablet Take 25 mcg by mouth daily before breakfast.  . loperamide (IMODIUM A-D) 2 MG tablet Take 2-4 mg by mouth See admin instructions. Take 2 tablets (4 mg) by mouth as needed after 1st loose stool followed by 1 tablet (2 mg) after each subsequent loose stool.  . magnesium hydroxide (MILK OF MAGNESIA) 400 MG/5ML suspension Take 15 mLs by mouth daily as needed (constipation).   . mirtazapine (REMERON) 30 MG tablet Take 30 mg by mouth at bedtime.   . Multiple Vitamins-Minerals (PRESERVISION AREDS PO) Take 1 tablet by mouth daily.   Marland Kitchen omeprazole (PRILOSEC) 20 MG capsule Take 20 mg by mouth daily.  . ondansetron (ZOFRAN) 4 MG tablet Take 4 mg by mouth daily as needed for nausea.   . sertraline (ZOLOFT) 100 MG tablet Take 100 mg by mouth daily.  . simvastatin (ZOCOR) 40 MG tablet Take 40 mg by mouth daily.  Marland Kitchen spironolactone (ALDACTONE) 25 MG tablet Take 0.5 tablets (12.5 mg total) by mouth daily.  Marland Kitchen torsemide (DEMADEX) 20 MG tablet Take 20 mg by mouth daily.   No facility-administered encounter medications on file as of 05/30/2020.    PHYSICAL EXAM/ROS: General: NAD, cooperative Cardiovascular:  regular rate and rhythm; denies chest pain Pulmonary: clear ant/post fields; normal respiratory effort, no coughing no shortness of breath Abdomen: soft, nontender, + bowel sounds GU: no suprapubic tenderness Extremities: no edema, no joint deformities Skin: no rashes to exposed skin Neurological: Weakness but otherwise nonfocal  06/01/2020, NP

## 2020-05-31 DIAGNOSIS — M6281 Muscle weakness (generalized): Secondary | ICD-10-CM | POA: Diagnosis not present

## 2020-05-31 DIAGNOSIS — Z9181 History of falling: Secondary | ICD-10-CM | POA: Diagnosis not present

## 2020-05-31 DIAGNOSIS — M25552 Pain in left hip: Secondary | ICD-10-CM | POA: Diagnosis not present

## 2020-05-31 DIAGNOSIS — H541224 Low vision right eye category 2, blindness left eye category 4: Secondary | ICD-10-CM | POA: Diagnosis not present

## 2020-05-31 DIAGNOSIS — R2681 Unsteadiness on feet: Secondary | ICD-10-CM | POA: Diagnosis not present

## 2020-05-31 DIAGNOSIS — M545 Low back pain: Secondary | ICD-10-CM | POA: Diagnosis not present

## 2020-05-31 DIAGNOSIS — H5410 Blindness, one eye, low vision other eye, unspecified eyes: Secondary | ICD-10-CM | POA: Diagnosis not present

## 2020-06-01 DIAGNOSIS — Z1159 Encounter for screening for other viral diseases: Secondary | ICD-10-CM | POA: Diagnosis not present

## 2020-06-01 DIAGNOSIS — Z20828 Contact with and (suspected) exposure to other viral communicable diseases: Secondary | ICD-10-CM | POA: Diagnosis not present

## 2020-06-02 DIAGNOSIS — M545 Low back pain: Secondary | ICD-10-CM | POA: Diagnosis not present

## 2020-06-02 DIAGNOSIS — H5410 Blindness, one eye, low vision other eye, unspecified eyes: Secondary | ICD-10-CM | POA: Diagnosis not present

## 2020-06-02 DIAGNOSIS — R2681 Unsteadiness on feet: Secondary | ICD-10-CM | POA: Diagnosis not present

## 2020-06-02 DIAGNOSIS — M6281 Muscle weakness (generalized): Secondary | ICD-10-CM | POA: Diagnosis not present

## 2020-06-02 DIAGNOSIS — Z9181 History of falling: Secondary | ICD-10-CM | POA: Diagnosis not present

## 2020-06-02 DIAGNOSIS — H541224 Low vision right eye category 2, blindness left eye category 4: Secondary | ICD-10-CM | POA: Diagnosis not present

## 2020-06-02 DIAGNOSIS — M25552 Pain in left hip: Secondary | ICD-10-CM | POA: Diagnosis not present

## 2020-06-05 DIAGNOSIS — R2681 Unsteadiness on feet: Secondary | ICD-10-CM | POA: Diagnosis not present

## 2020-06-05 DIAGNOSIS — Z1159 Encounter for screening for other viral diseases: Secondary | ICD-10-CM | POA: Diagnosis not present

## 2020-06-05 DIAGNOSIS — M6281 Muscle weakness (generalized): Secondary | ICD-10-CM | POA: Diagnosis not present

## 2020-06-05 DIAGNOSIS — Z20828 Contact with and (suspected) exposure to other viral communicable diseases: Secondary | ICD-10-CM | POA: Diagnosis not present

## 2020-06-05 DIAGNOSIS — M25552 Pain in left hip: Secondary | ICD-10-CM | POA: Diagnosis not present

## 2020-06-05 DIAGNOSIS — H5410 Blindness, one eye, low vision other eye, unspecified eyes: Secondary | ICD-10-CM | POA: Diagnosis not present

## 2020-06-05 DIAGNOSIS — H541224 Low vision right eye category 2, blindness left eye category 4: Secondary | ICD-10-CM | POA: Diagnosis not present

## 2020-06-05 DIAGNOSIS — M545 Low back pain: Secondary | ICD-10-CM | POA: Diagnosis not present

## 2020-06-05 DIAGNOSIS — Z9181 History of falling: Secondary | ICD-10-CM | POA: Diagnosis not present

## 2020-06-06 DIAGNOSIS — M545 Low back pain: Secondary | ICD-10-CM | POA: Diagnosis not present

## 2020-06-06 DIAGNOSIS — H541224 Low vision right eye category 2, blindness left eye category 4: Secondary | ICD-10-CM | POA: Diagnosis not present

## 2020-06-06 DIAGNOSIS — M25552 Pain in left hip: Secondary | ICD-10-CM | POA: Diagnosis not present

## 2020-06-06 DIAGNOSIS — H5410 Blindness, one eye, low vision other eye, unspecified eyes: Secondary | ICD-10-CM | POA: Diagnosis not present

## 2020-06-06 DIAGNOSIS — R2681 Unsteadiness on feet: Secondary | ICD-10-CM | POA: Diagnosis not present

## 2020-06-06 DIAGNOSIS — Z9181 History of falling: Secondary | ICD-10-CM | POA: Diagnosis not present

## 2020-06-06 DIAGNOSIS — M6281 Muscle weakness (generalized): Secondary | ICD-10-CM | POA: Diagnosis not present

## 2020-06-07 DIAGNOSIS — Z9181 History of falling: Secondary | ICD-10-CM | POA: Diagnosis not present

## 2020-06-07 DIAGNOSIS — M6281 Muscle weakness (generalized): Secondary | ICD-10-CM | POA: Diagnosis not present

## 2020-06-07 DIAGNOSIS — R2681 Unsteadiness on feet: Secondary | ICD-10-CM | POA: Diagnosis not present

## 2020-06-07 DIAGNOSIS — H5410 Blindness, one eye, low vision other eye, unspecified eyes: Secondary | ICD-10-CM | POA: Diagnosis not present

## 2020-06-07 DIAGNOSIS — H541224 Low vision right eye category 2, blindness left eye category 4: Secondary | ICD-10-CM | POA: Diagnosis not present

## 2020-06-07 DIAGNOSIS — M25552 Pain in left hip: Secondary | ICD-10-CM | POA: Diagnosis not present

## 2020-06-07 DIAGNOSIS — M545 Low back pain: Secondary | ICD-10-CM | POA: Diagnosis not present

## 2020-06-08 DIAGNOSIS — Z1159 Encounter for screening for other viral diseases: Secondary | ICD-10-CM | POA: Diagnosis not present

## 2020-06-08 DIAGNOSIS — Z20828 Contact with and (suspected) exposure to other viral communicable diseases: Secondary | ICD-10-CM | POA: Diagnosis not present

## 2020-06-09 DIAGNOSIS — R2681 Unsteadiness on feet: Secondary | ICD-10-CM | POA: Diagnosis not present

## 2020-06-09 DIAGNOSIS — H5410 Blindness, one eye, low vision other eye, unspecified eyes: Secondary | ICD-10-CM | POA: Diagnosis not present

## 2020-06-09 DIAGNOSIS — M6281 Muscle weakness (generalized): Secondary | ICD-10-CM | POA: Diagnosis not present

## 2020-06-09 DIAGNOSIS — M545 Low back pain: Secondary | ICD-10-CM | POA: Diagnosis not present

## 2020-06-09 DIAGNOSIS — Z9181 History of falling: Secondary | ICD-10-CM | POA: Diagnosis not present

## 2020-06-09 DIAGNOSIS — H541224 Low vision right eye category 2, blindness left eye category 4: Secondary | ICD-10-CM | POA: Diagnosis not present

## 2020-06-09 DIAGNOSIS — M25552 Pain in left hip: Secondary | ICD-10-CM | POA: Diagnosis not present

## 2020-06-12 DIAGNOSIS — M545 Low back pain: Secondary | ICD-10-CM | POA: Diagnosis not present

## 2020-06-12 DIAGNOSIS — R2681 Unsteadiness on feet: Secondary | ICD-10-CM | POA: Diagnosis not present

## 2020-06-12 DIAGNOSIS — N183 Chronic kidney disease, stage 3 unspecified: Secondary | ICD-10-CM | POA: Diagnosis not present

## 2020-06-12 DIAGNOSIS — J45909 Unspecified asthma, uncomplicated: Secondary | ICD-10-CM | POA: Diagnosis not present

## 2020-06-12 DIAGNOSIS — M810 Age-related osteoporosis without current pathological fracture: Secondary | ICD-10-CM | POA: Diagnosis not present

## 2020-06-12 DIAGNOSIS — Z9181 History of falling: Secondary | ICD-10-CM | POA: Diagnosis not present

## 2020-06-12 DIAGNOSIS — E1122 Type 2 diabetes mellitus with diabetic chronic kidney disease: Secondary | ICD-10-CM | POA: Diagnosis not present

## 2020-06-12 DIAGNOSIS — I5022 Chronic systolic (congestive) heart failure: Secondary | ICD-10-CM | POA: Diagnosis not present

## 2020-06-12 DIAGNOSIS — Z1159 Encounter for screening for other viral diseases: Secondary | ICD-10-CM | POA: Diagnosis not present

## 2020-06-12 DIAGNOSIS — E1142 Type 2 diabetes mellitus with diabetic polyneuropathy: Secondary | ICD-10-CM | POA: Diagnosis not present

## 2020-06-12 DIAGNOSIS — Z20828 Contact with and (suspected) exposure to other viral communicable diseases: Secondary | ICD-10-CM | POA: Diagnosis not present

## 2020-06-12 DIAGNOSIS — M6281 Muscle weakness (generalized): Secondary | ICD-10-CM | POA: Diagnosis not present

## 2020-06-12 DIAGNOSIS — R296 Repeated falls: Secondary | ICD-10-CM | POA: Diagnosis not present

## 2020-06-12 DIAGNOSIS — H541224 Low vision right eye category 2, blindness left eye category 4: Secondary | ICD-10-CM | POA: Diagnosis not present

## 2020-06-12 DIAGNOSIS — M25552 Pain in left hip: Secondary | ICD-10-CM | POA: Diagnosis not present

## 2020-06-12 DIAGNOSIS — H5410 Blindness, one eye, low vision other eye, unspecified eyes: Secondary | ICD-10-CM | POA: Diagnosis not present

## 2020-06-13 DIAGNOSIS — M6281 Muscle weakness (generalized): Secondary | ICD-10-CM | POA: Diagnosis not present

## 2020-06-13 DIAGNOSIS — M25552 Pain in left hip: Secondary | ICD-10-CM | POA: Diagnosis not present

## 2020-06-13 DIAGNOSIS — M545 Low back pain: Secondary | ICD-10-CM | POA: Diagnosis not present

## 2020-06-13 DIAGNOSIS — H5410 Blindness, one eye, low vision other eye, unspecified eyes: Secondary | ICD-10-CM | POA: Diagnosis not present

## 2020-06-13 DIAGNOSIS — H541224 Low vision right eye category 2, blindness left eye category 4: Secondary | ICD-10-CM | POA: Diagnosis not present

## 2020-06-13 DIAGNOSIS — R2681 Unsteadiness on feet: Secondary | ICD-10-CM | POA: Diagnosis not present

## 2020-06-13 DIAGNOSIS — Z9181 History of falling: Secondary | ICD-10-CM | POA: Diagnosis not present

## 2020-06-14 DIAGNOSIS — M6281 Muscle weakness (generalized): Secondary | ICD-10-CM | POA: Diagnosis not present

## 2020-06-14 DIAGNOSIS — Z9181 History of falling: Secondary | ICD-10-CM | POA: Diagnosis not present

## 2020-06-14 DIAGNOSIS — M545 Low back pain: Secondary | ICD-10-CM | POA: Diagnosis not present

## 2020-06-14 DIAGNOSIS — H5410 Blindness, one eye, low vision other eye, unspecified eyes: Secondary | ICD-10-CM | POA: Diagnosis not present

## 2020-06-14 DIAGNOSIS — H541224 Low vision right eye category 2, blindness left eye category 4: Secondary | ICD-10-CM | POA: Diagnosis not present

## 2020-06-14 DIAGNOSIS — R2681 Unsteadiness on feet: Secondary | ICD-10-CM | POA: Diagnosis not present

## 2020-06-14 DIAGNOSIS — M25552 Pain in left hip: Secondary | ICD-10-CM | POA: Diagnosis not present

## 2020-06-19 DIAGNOSIS — M545 Low back pain: Secondary | ICD-10-CM | POA: Diagnosis not present

## 2020-06-19 DIAGNOSIS — Z1159 Encounter for screening for other viral diseases: Secondary | ICD-10-CM | POA: Diagnosis not present

## 2020-06-19 DIAGNOSIS — M6281 Muscle weakness (generalized): Secondary | ICD-10-CM | POA: Diagnosis not present

## 2020-06-19 DIAGNOSIS — R2681 Unsteadiness on feet: Secondary | ICD-10-CM | POA: Diagnosis not present

## 2020-06-19 DIAGNOSIS — H541224 Low vision right eye category 2, blindness left eye category 4: Secondary | ICD-10-CM | POA: Diagnosis not present

## 2020-06-19 DIAGNOSIS — L309 Dermatitis, unspecified: Secondary | ICD-10-CM | POA: Diagnosis not present

## 2020-06-19 DIAGNOSIS — H5410 Blindness, one eye, low vision other eye, unspecified eyes: Secondary | ICD-10-CM | POA: Diagnosis not present

## 2020-06-19 DIAGNOSIS — M25552 Pain in left hip: Secondary | ICD-10-CM | POA: Diagnosis not present

## 2020-06-19 DIAGNOSIS — Z20828 Contact with and (suspected) exposure to other viral communicable diseases: Secondary | ICD-10-CM | POA: Diagnosis not present

## 2020-06-19 DIAGNOSIS — Z9181 History of falling: Secondary | ICD-10-CM | POA: Diagnosis not present

## 2020-06-20 DIAGNOSIS — M545 Low back pain: Secondary | ICD-10-CM | POA: Diagnosis not present

## 2020-06-20 DIAGNOSIS — Z9181 History of falling: Secondary | ICD-10-CM | POA: Diagnosis not present

## 2020-06-20 DIAGNOSIS — M25552 Pain in left hip: Secondary | ICD-10-CM | POA: Diagnosis not present

## 2020-06-20 DIAGNOSIS — H5410 Blindness, one eye, low vision other eye, unspecified eyes: Secondary | ICD-10-CM | POA: Diagnosis not present

## 2020-06-20 DIAGNOSIS — R2681 Unsteadiness on feet: Secondary | ICD-10-CM | POA: Diagnosis not present

## 2020-06-20 DIAGNOSIS — H541224 Low vision right eye category 2, blindness left eye category 4: Secondary | ICD-10-CM | POA: Diagnosis not present

## 2020-06-20 DIAGNOSIS — M6281 Muscle weakness (generalized): Secondary | ICD-10-CM | POA: Diagnosis not present

## 2020-06-22 DIAGNOSIS — M25552 Pain in left hip: Secondary | ICD-10-CM | POA: Diagnosis not present

## 2020-06-22 DIAGNOSIS — Z20828 Contact with and (suspected) exposure to other viral communicable diseases: Secondary | ICD-10-CM | POA: Diagnosis not present

## 2020-06-22 DIAGNOSIS — H541224 Low vision right eye category 2, blindness left eye category 4: Secondary | ICD-10-CM | POA: Diagnosis not present

## 2020-06-22 DIAGNOSIS — Z1159 Encounter for screening for other viral diseases: Secondary | ICD-10-CM | POA: Diagnosis not present

## 2020-06-22 DIAGNOSIS — M6281 Muscle weakness (generalized): Secondary | ICD-10-CM | POA: Diagnosis not present

## 2020-06-22 DIAGNOSIS — H5410 Blindness, one eye, low vision other eye, unspecified eyes: Secondary | ICD-10-CM | POA: Diagnosis not present

## 2020-06-22 DIAGNOSIS — Z9181 History of falling: Secondary | ICD-10-CM | POA: Diagnosis not present

## 2020-06-22 DIAGNOSIS — M545 Low back pain: Secondary | ICD-10-CM | POA: Diagnosis not present

## 2020-06-22 DIAGNOSIS — R2681 Unsteadiness on feet: Secondary | ICD-10-CM | POA: Diagnosis not present

## 2020-06-26 DIAGNOSIS — M545 Low back pain: Secondary | ICD-10-CM | POA: Diagnosis not present

## 2020-06-26 DIAGNOSIS — M25552 Pain in left hip: Secondary | ICD-10-CM | POA: Diagnosis not present

## 2020-06-26 DIAGNOSIS — Z9181 History of falling: Secondary | ICD-10-CM | POA: Diagnosis not present

## 2020-06-26 DIAGNOSIS — H541224 Low vision right eye category 2, blindness left eye category 4: Secondary | ICD-10-CM | POA: Diagnosis not present

## 2020-06-26 DIAGNOSIS — Z20828 Contact with and (suspected) exposure to other viral communicable diseases: Secondary | ICD-10-CM | POA: Diagnosis not present

## 2020-06-26 DIAGNOSIS — Z1159 Encounter for screening for other viral diseases: Secondary | ICD-10-CM | POA: Diagnosis not present

## 2020-06-26 DIAGNOSIS — M6281 Muscle weakness (generalized): Secondary | ICD-10-CM | POA: Diagnosis not present

## 2020-06-26 DIAGNOSIS — H5410 Blindness, one eye, low vision other eye, unspecified eyes: Secondary | ICD-10-CM | POA: Diagnosis not present

## 2020-06-26 DIAGNOSIS — R2681 Unsteadiness on feet: Secondary | ICD-10-CM | POA: Diagnosis not present

## 2020-06-29 DIAGNOSIS — R2681 Unsteadiness on feet: Secondary | ICD-10-CM | POA: Diagnosis not present

## 2020-06-29 DIAGNOSIS — H541224 Low vision right eye category 2, blindness left eye category 4: Secondary | ICD-10-CM | POA: Diagnosis not present

## 2020-06-29 DIAGNOSIS — Z9181 History of falling: Secondary | ICD-10-CM | POA: Diagnosis not present

## 2020-06-29 DIAGNOSIS — Z1159 Encounter for screening for other viral diseases: Secondary | ICD-10-CM | POA: Diagnosis not present

## 2020-06-29 DIAGNOSIS — M6281 Muscle weakness (generalized): Secondary | ICD-10-CM | POA: Diagnosis not present

## 2020-06-29 DIAGNOSIS — M25552 Pain in left hip: Secondary | ICD-10-CM | POA: Diagnosis not present

## 2020-06-29 DIAGNOSIS — M545 Low back pain: Secondary | ICD-10-CM | POA: Diagnosis not present

## 2020-06-29 DIAGNOSIS — Z20828 Contact with and (suspected) exposure to other viral communicable diseases: Secondary | ICD-10-CM | POA: Diagnosis not present

## 2020-06-29 DIAGNOSIS — H5410 Blindness, one eye, low vision other eye, unspecified eyes: Secondary | ICD-10-CM | POA: Diagnosis not present

## 2020-07-03 DIAGNOSIS — Z20828 Contact with and (suspected) exposure to other viral communicable diseases: Secondary | ICD-10-CM | POA: Diagnosis not present

## 2020-07-03 DIAGNOSIS — Z1159 Encounter for screening for other viral diseases: Secondary | ICD-10-CM | POA: Diagnosis not present

## 2020-07-04 DIAGNOSIS — M25552 Pain in left hip: Secondary | ICD-10-CM | POA: Diagnosis not present

## 2020-07-04 DIAGNOSIS — Z9181 History of falling: Secondary | ICD-10-CM | POA: Diagnosis not present

## 2020-07-04 DIAGNOSIS — H541224 Low vision right eye category 2, blindness left eye category 4: Secondary | ICD-10-CM | POA: Diagnosis not present

## 2020-07-04 DIAGNOSIS — H5410 Blindness, one eye, low vision other eye, unspecified eyes: Secondary | ICD-10-CM | POA: Diagnosis not present

## 2020-07-04 DIAGNOSIS — R2681 Unsteadiness on feet: Secondary | ICD-10-CM | POA: Diagnosis not present

## 2020-07-04 DIAGNOSIS — M545 Low back pain: Secondary | ICD-10-CM | POA: Diagnosis not present

## 2020-07-04 DIAGNOSIS — M6281 Muscle weakness (generalized): Secondary | ICD-10-CM | POA: Diagnosis not present

## 2020-07-06 DIAGNOSIS — H541224 Low vision right eye category 2, blindness left eye category 4: Secondary | ICD-10-CM | POA: Diagnosis not present

## 2020-07-06 DIAGNOSIS — Z1159 Encounter for screening for other viral diseases: Secondary | ICD-10-CM | POA: Diagnosis not present

## 2020-07-06 DIAGNOSIS — M6281 Muscle weakness (generalized): Secondary | ICD-10-CM | POA: Diagnosis not present

## 2020-07-06 DIAGNOSIS — Z9181 History of falling: Secondary | ICD-10-CM | POA: Diagnosis not present

## 2020-07-06 DIAGNOSIS — Z20828 Contact with and (suspected) exposure to other viral communicable diseases: Secondary | ICD-10-CM | POA: Diagnosis not present

## 2020-07-06 DIAGNOSIS — H5410 Blindness, one eye, low vision other eye, unspecified eyes: Secondary | ICD-10-CM | POA: Diagnosis not present

## 2020-07-06 DIAGNOSIS — M545 Low back pain: Secondary | ICD-10-CM | POA: Diagnosis not present

## 2020-07-06 DIAGNOSIS — R2681 Unsteadiness on feet: Secondary | ICD-10-CM | POA: Diagnosis not present

## 2020-07-06 DIAGNOSIS — M25552 Pain in left hip: Secondary | ICD-10-CM | POA: Diagnosis not present

## 2020-07-10 DIAGNOSIS — Z1159 Encounter for screening for other viral diseases: Secondary | ICD-10-CM | POA: Diagnosis not present

## 2020-07-10 DIAGNOSIS — Z20828 Contact with and (suspected) exposure to other viral communicable diseases: Secondary | ICD-10-CM | POA: Diagnosis not present

## 2020-07-11 DIAGNOSIS — M25552 Pain in left hip: Secondary | ICD-10-CM | POA: Diagnosis not present

## 2020-07-11 DIAGNOSIS — M545 Low back pain: Secondary | ICD-10-CM | POA: Diagnosis not present

## 2020-07-11 DIAGNOSIS — H541224 Low vision right eye category 2, blindness left eye category 4: Secondary | ICD-10-CM | POA: Diagnosis not present

## 2020-07-11 DIAGNOSIS — H5410 Blindness, one eye, low vision other eye, unspecified eyes: Secondary | ICD-10-CM | POA: Diagnosis not present

## 2020-07-11 DIAGNOSIS — M6281 Muscle weakness (generalized): Secondary | ICD-10-CM | POA: Diagnosis not present

## 2020-07-11 DIAGNOSIS — R2681 Unsteadiness on feet: Secondary | ICD-10-CM | POA: Diagnosis not present

## 2020-07-11 DIAGNOSIS — Z9181 History of falling: Secondary | ICD-10-CM | POA: Diagnosis not present

## 2020-07-13 DIAGNOSIS — I5022 Chronic systolic (congestive) heart failure: Secondary | ICD-10-CM | POA: Diagnosis not present

## 2020-07-13 DIAGNOSIS — E1122 Type 2 diabetes mellitus with diabetic chronic kidney disease: Secondary | ICD-10-CM | POA: Diagnosis not present

## 2020-07-13 DIAGNOSIS — E1142 Type 2 diabetes mellitus with diabetic polyneuropathy: Secondary | ICD-10-CM | POA: Diagnosis not present

## 2020-07-13 DIAGNOSIS — Z20828 Contact with and (suspected) exposure to other viral communicable diseases: Secondary | ICD-10-CM | POA: Diagnosis not present

## 2020-07-13 DIAGNOSIS — M810 Age-related osteoporosis without current pathological fracture: Secondary | ICD-10-CM | POA: Diagnosis not present

## 2020-07-13 DIAGNOSIS — Z1159 Encounter for screening for other viral diseases: Secondary | ICD-10-CM | POA: Diagnosis not present

## 2020-07-13 DIAGNOSIS — N183 Chronic kidney disease, stage 3 unspecified: Secondary | ICD-10-CM | POA: Diagnosis not present

## 2020-07-13 DIAGNOSIS — R296 Repeated falls: Secondary | ICD-10-CM | POA: Diagnosis not present

## 2020-07-13 DIAGNOSIS — J45909 Unspecified asthma, uncomplicated: Secondary | ICD-10-CM | POA: Diagnosis not present

## 2020-07-14 DIAGNOSIS — M6281 Muscle weakness (generalized): Secondary | ICD-10-CM | POA: Diagnosis not present

## 2020-07-14 DIAGNOSIS — M545 Low back pain: Secondary | ICD-10-CM | POA: Diagnosis not present

## 2020-07-14 DIAGNOSIS — H541224 Low vision right eye category 2, blindness left eye category 4: Secondary | ICD-10-CM | POA: Diagnosis not present

## 2020-07-14 DIAGNOSIS — R2681 Unsteadiness on feet: Secondary | ICD-10-CM | POA: Diagnosis not present

## 2020-07-14 DIAGNOSIS — Z9181 History of falling: Secondary | ICD-10-CM | POA: Diagnosis not present

## 2020-07-14 DIAGNOSIS — H5410 Blindness, one eye, low vision other eye, unspecified eyes: Secondary | ICD-10-CM | POA: Diagnosis not present

## 2020-07-14 DIAGNOSIS — M25552 Pain in left hip: Secondary | ICD-10-CM | POA: Diagnosis not present

## 2020-07-17 DIAGNOSIS — Z20828 Contact with and (suspected) exposure to other viral communicable diseases: Secondary | ICD-10-CM | POA: Diagnosis not present

## 2020-07-17 DIAGNOSIS — Z1159 Encounter for screening for other viral diseases: Secondary | ICD-10-CM | POA: Diagnosis not present

## 2020-07-18 DIAGNOSIS — M6281 Muscle weakness (generalized): Secondary | ICD-10-CM | POA: Diagnosis not present

## 2020-07-18 DIAGNOSIS — M25552 Pain in left hip: Secondary | ICD-10-CM | POA: Diagnosis not present

## 2020-07-18 DIAGNOSIS — M545 Low back pain: Secondary | ICD-10-CM | POA: Diagnosis not present

## 2020-07-18 DIAGNOSIS — Z9181 History of falling: Secondary | ICD-10-CM | POA: Diagnosis not present

## 2020-07-18 DIAGNOSIS — H5410 Blindness, one eye, low vision other eye, unspecified eyes: Secondary | ICD-10-CM | POA: Diagnosis not present

## 2020-07-18 DIAGNOSIS — R2681 Unsteadiness on feet: Secondary | ICD-10-CM | POA: Diagnosis not present

## 2020-07-18 DIAGNOSIS — H541224 Low vision right eye category 2, blindness left eye category 4: Secondary | ICD-10-CM | POA: Diagnosis not present

## 2020-07-20 DIAGNOSIS — Z20828 Contact with and (suspected) exposure to other viral communicable diseases: Secondary | ICD-10-CM | POA: Diagnosis not present

## 2020-07-20 DIAGNOSIS — Z1159 Encounter for screening for other viral diseases: Secondary | ICD-10-CM | POA: Diagnosis not present

## 2020-07-24 DIAGNOSIS — M25552 Pain in left hip: Secondary | ICD-10-CM | POA: Diagnosis not present

## 2020-07-24 DIAGNOSIS — M6281 Muscle weakness (generalized): Secondary | ICD-10-CM | POA: Diagnosis not present

## 2020-07-24 DIAGNOSIS — Z20828 Contact with and (suspected) exposure to other viral communicable diseases: Secondary | ICD-10-CM | POA: Diagnosis not present

## 2020-07-24 DIAGNOSIS — Z1159 Encounter for screening for other viral diseases: Secondary | ICD-10-CM | POA: Diagnosis not present

## 2020-07-24 DIAGNOSIS — M545 Low back pain: Secondary | ICD-10-CM | POA: Diagnosis not present

## 2020-07-24 DIAGNOSIS — R2681 Unsteadiness on feet: Secondary | ICD-10-CM | POA: Diagnosis not present

## 2020-07-27 DIAGNOSIS — M6281 Muscle weakness (generalized): Secondary | ICD-10-CM | POA: Diagnosis not present

## 2020-07-27 DIAGNOSIS — Z1159 Encounter for screening for other viral diseases: Secondary | ICD-10-CM | POA: Diagnosis not present

## 2020-07-27 DIAGNOSIS — Z20828 Contact with and (suspected) exposure to other viral communicable diseases: Secondary | ICD-10-CM | POA: Diagnosis not present

## 2020-07-27 DIAGNOSIS — M25552 Pain in left hip: Secondary | ICD-10-CM | POA: Diagnosis not present

## 2020-07-27 DIAGNOSIS — R2681 Unsteadiness on feet: Secondary | ICD-10-CM | POA: Diagnosis not present

## 2020-07-27 DIAGNOSIS — M545 Low back pain: Secondary | ICD-10-CM | POA: Diagnosis not present

## 2020-07-31 DIAGNOSIS — Z1159 Encounter for screening for other viral diseases: Secondary | ICD-10-CM | POA: Diagnosis not present

## 2020-07-31 DIAGNOSIS — Z20828 Contact with and (suspected) exposure to other viral communicable diseases: Secondary | ICD-10-CM | POA: Diagnosis not present

## 2020-08-03 DIAGNOSIS — Z20828 Contact with and (suspected) exposure to other viral communicable diseases: Secondary | ICD-10-CM | POA: Diagnosis not present

## 2020-08-03 DIAGNOSIS — Z1159 Encounter for screening for other viral diseases: Secondary | ICD-10-CM | POA: Diagnosis not present

## 2020-08-07 DIAGNOSIS — Z1159 Encounter for screening for other viral diseases: Secondary | ICD-10-CM | POA: Diagnosis not present

## 2020-08-07 DIAGNOSIS — Z20828 Contact with and (suspected) exposure to other viral communicable diseases: Secondary | ICD-10-CM | POA: Diagnosis not present

## 2020-08-13 DIAGNOSIS — E1142 Type 2 diabetes mellitus with diabetic polyneuropathy: Secondary | ICD-10-CM | POA: Diagnosis not present

## 2020-08-13 DIAGNOSIS — E1122 Type 2 diabetes mellitus with diabetic chronic kidney disease: Secondary | ICD-10-CM | POA: Diagnosis not present

## 2020-08-13 DIAGNOSIS — R296 Repeated falls: Secondary | ICD-10-CM | POA: Diagnosis not present

## 2020-08-13 DIAGNOSIS — J45909 Unspecified asthma, uncomplicated: Secondary | ICD-10-CM | POA: Diagnosis not present

## 2020-08-13 DIAGNOSIS — N183 Chronic kidney disease, stage 3 unspecified: Secondary | ICD-10-CM | POA: Diagnosis not present

## 2020-08-13 DIAGNOSIS — I5022 Chronic systolic (congestive) heart failure: Secondary | ICD-10-CM | POA: Diagnosis not present

## 2020-08-13 DIAGNOSIS — M810 Age-related osteoporosis without current pathological fracture: Secondary | ICD-10-CM | POA: Diagnosis not present

## 2020-08-14 DIAGNOSIS — Z20828 Contact with and (suspected) exposure to other viral communicable diseases: Secondary | ICD-10-CM | POA: Diagnosis not present

## 2020-08-14 DIAGNOSIS — Z1159 Encounter for screening for other viral diseases: Secondary | ICD-10-CM | POA: Diagnosis not present

## 2020-08-17 DIAGNOSIS — Z20828 Contact with and (suspected) exposure to other viral communicable diseases: Secondary | ICD-10-CM | POA: Diagnosis not present

## 2020-08-17 DIAGNOSIS — Z1159 Encounter for screening for other viral diseases: Secondary | ICD-10-CM | POA: Diagnosis not present

## 2020-08-21 DIAGNOSIS — Z20828 Contact with and (suspected) exposure to other viral communicable diseases: Secondary | ICD-10-CM | POA: Diagnosis not present

## 2020-08-21 DIAGNOSIS — Z1159 Encounter for screening for other viral diseases: Secondary | ICD-10-CM | POA: Diagnosis not present

## 2020-08-22 ENCOUNTER — Non-Acute Institutional Stay: Payer: Medicare HMO | Admitting: Hospice

## 2020-08-22 ENCOUNTER — Other Ambulatory Visit: Payer: Self-pay

## 2020-08-22 DIAGNOSIS — Z515 Encounter for palliative care: Secondary | ICD-10-CM | POA: Diagnosis not present

## 2020-08-22 DIAGNOSIS — I509 Heart failure, unspecified: Secondary | ICD-10-CM

## 2020-08-22 NOTE — Progress Notes (Signed)
Therapist, nutritional Palliative Care Consult Note Telephone: [phone]  Fax: [phone]  PATIENT NAME: [name] DOB: 06-01-1923 MRN: [id number]  PRIMARY CARE PROVIDER:   [name], MD [name], MD [address] [place],  [place] [address]  REFERRING PROVIDER: [name], MD [name], MD [address] [place],  [place] [address]  Contact: [place] [name] - Niece. POA [phone]  .  RECOMMENDATIONS/PLAN:  Advance Care Planning:  Visit consisted of building trust and discussions and follow up on  Palliative Medicine as specialized medical care for people living with serious illness, aimed at facilitating better quality of life through symptoms relief, assisting with advance care plan and establishing goals of care.  CODE STATUS:Patient remains a DO NOT RESUSCITATE.   Goals of care: Goals of care include to maximize quality of life and symptom management.   Patient shared that sometimes she gets worried and anxious because she is not able to handle her affairs but depend on other people to do it for her.  Therapeutic listening and ample emotional support provided. Visit consisted of discussion dealing with the complex and emotionally intense issues of symptom management and palliative care in the setting of serious and potentially life-threatening illness. Palliative care team will continue to support patient, patient's family, and medical team.  Follow VO:[id number] care will continue to follow patient for goals of care clarification and symptom management.  Follow-up in 3 months.  Symptom management:  Patient in no medical acuity, denies chest pain, coughing, shortness of breath.    She endorsed weakness and some shortness of breath on moderate exertion.  Patient continues on physical therapy and occupational Therapy for increasing strength, dexterity and ability to walk. She continues to  use her rolling walker for support.   No report of constipation.  Patient now has milk of magnesia as needed for constipation.  Appetite is good and no reported weight loss at this time. She is compliant with her medications.  She said she did not sleep well last night because she was woken up by amber alert on her phone which continued to go on because she did not know how to stop it.  She plans to take a nap after lunch.  NP validated and encouraged her to use her call bell when she needs help or has any concerns.  She continues on mirtazapine diazepam and sertraline for depression/anxiety.  She is followed by Dr. Leanor Rubenstein. Nursing with no concerns or complaints at this time. Palliative will continue to monitor for symptom management/decline and make recommendations as needed.  I spent69minutes providing this consultation; time includes chart review and documentation. More than 50% of the time in this consultation was spent on coordinating communication  History of present illness: Kristen Sandoval Rickettsis a 86 y.o.year oldfemalewith multiple medical problems including HTN Afib, CHF, GERD, left eye blindness, low vision in the right eye. Palliative Care was asked to help address goals of care.  CODE STATUS:DNR  PPS:50%  HOSPICE ELIGIBILITY/DIAGNOSIS: TBD  PAST MEDICAL HISTORY:  Past Medical History:  Diagnosis Date  . Atrial fibrillation (HCC)   . Cholelithiasis   . Constipation   . Diabetes mellitus   . Dizziness    with some gait disability  . GERD (gastroesophageal reflux disease)   . Hepatic cyst   . Hyperlipidemia   . Hypertension   . Ischemic cardiomyopathy   . LBBB (left bundle branch block)   . Macular degeneration   .  Other postprocedural status(V45.89)   . Peripheral edema     SOCIAL HX:  Social History   Tobacco Use  . Smoking status: Passive Smoke Exposure - Never Smoker  . Smokeless tobacco: Never Used  . 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 Alcohol use: No    ALLERGIES:  Allergies  Allergen Reactions  . Fosamax [Alendronate Sodium] Swelling  . Penicillins Swelling and Other (See Comments)    Swelling, redness and warmth at injection site Has patient had a PCN reaction causing immediate rash, facial/tongue/throat swelling, SOB or lightheadedness with hypotension: No Has patient had a PCN reaction causing severe rash involving mucus membranes or skin necrosis: No Has patient had a PCN reaction that required hospitalization: No Has patient had a PCN reaction occurring within the last 10 years: No If all of the above answers are "NO", then may proceed with Cephalosporin use.  . Sulfonamide Derivatives Swelling  . Tetanus Toxoid Swelling     PERTINENT MEDICATIONS:  Outpatient Encounter Medications as of 08/22/2020  Medication Sig  . acetaminophen (TYLENOL) 325 MG tablet Take 325 mg by mouth every 6 (six) hours as needed (pain).  Marland Kitchen acetaminophen (TYLENOL) 500 MG tablet Take 500 mg by mouth every evening.  Marland Kitchen aspirin 81 MG chewable tablet Chew 81 mg by mouth daily.   . benzonatate (TESSALON) 100 MG capsule Take 100 mg by mouth 3 (three) times daily as needed for cough.  . carvedilol (COREG) 6.25 MG tablet Take 6.25 mg by mouth 2 (two) times daily.  . colestipol (COLESTID) 1 g tablet Take 2 tablets (2 g total) by mouth daily. Hold FOR CONSTIPATION (Patient not taking: Reported on 07/02/2019)  . diazepam (VALIUM) 5 MG tablet Take 2.5-5 mg by mouth See admin instructions. Take 2.5mg  by mouth in the morning and take 5mg  by mouth before bedtime  . guaifenesin (ROBITUSSIN) 100 MG/5ML syrup Take 200 mg by mouth every 8 (eight) hours as needed for cough.  . lactobacillus acidophilus & bulgar (LACTINEX) chewable tablet Chew 1 tablet by mouth daily.  levothyroxine (SYNTHROID, LEVOTHROID) 25 MCG tablet Take 25 mcg by mouth daily before breakfast.  . loperamide (IMODIUM A-D) 2 MG tablet Take  2-4 mg by mouth See admin instructions. Take 2 tablets (4 mg) by mouth as needed after 1st loose stool followed by 1 tablet (2 mg) after each subsequent loose stool.  . magnesium hydroxide (MILK OF MAGNESIA) 400 MG/5ML suspension Take 15 mLs by mouth daily as needed (constipation).   . mirtazapine (REMERON) 30 MG tablet Take 30 mg by mouth at bedtime.   . Multiple Vitamins-Minerals (PRESERVISION AREDS PO) Take 1 tablet by mouth daily.   Marland Kitchen omeprazole (PRILOSEC) 20 MG capsule Take 20 mg by mouth daily.  . ondansetron (ZOFRAN) 4 MG tablet Take 4 mg by mouth daily as needed for nausea.   . sertraline (ZOLOFT) 100 MG tablet Take 100 mg by mouth daily.  . simvastatin (ZOCOR) 40 MG tablet Take 40 mg by mouth daily.  Marland Kitchen spironolactone (ALDACTONE) 25 MG tablet Take 0.5 tablets (12.5 mg total) by mouth daily.  Marland Kitchen torsemide (DEMADEX) 20 MG tablet Take 20 mg by mouth daily.   No facility-administered encounter medications on file as of 08/22/2020.    PHYSICAL EXAM/ROS:  General: NAD,cooperative Cardiovascular: regular rate and rhythm;denies chest pain Pulmonary: clear ant/postfields;normal respiratory effort,no coughing no shortness of breath during discussion; endorsed shortness of breath on moderate exertion.  Normal respiratory effort  on room air Abdomen: soft, nontender, + bowel sounds GU: no suprapubic tenderness Extremities: no edema, no joint deformities Skin: no rashesto exposed skin; fragile skin that bruises easily Neurological: Weakness but otherwise nonfocal; alert and oriented to person and  place  Rosaura Carpenter, NP

## 2020-08-24 DIAGNOSIS — Z1159 Encounter for screening for other viral diseases: Secondary | ICD-10-CM | POA: Diagnosis not present

## 2020-08-24 DIAGNOSIS — Z20828 Contact with and (suspected) exposure to other viral communicable diseases: Secondary | ICD-10-CM | POA: Diagnosis not present

## 2020-08-25 DIAGNOSIS — F411 Generalized anxiety disorder: Secondary | ICD-10-CM | POA: Diagnosis not present

## 2020-08-25 DIAGNOSIS — D692 Other nonthrombocytopenic purpura: Secondary | ICD-10-CM | POA: Diagnosis not present

## 2020-08-25 DIAGNOSIS — R4182 Altered mental status, unspecified: Secondary | ICD-10-CM | POA: Diagnosis not present

## 2020-08-28 DIAGNOSIS — Z20828 Contact with and (suspected) exposure to other viral communicable diseases: Secondary | ICD-10-CM | POA: Diagnosis not present

## 2020-08-28 DIAGNOSIS — R4182 Altered mental status, unspecified: Secondary | ICD-10-CM | POA: Diagnosis not present

## 2020-08-28 DIAGNOSIS — Z1159 Encounter for screening for other viral diseases: Secondary | ICD-10-CM | POA: Diagnosis not present

## 2020-08-31 DIAGNOSIS — Z20828 Contact with and (suspected) exposure to other viral communicable diseases: Secondary | ICD-10-CM | POA: Diagnosis not present

## 2020-08-31 DIAGNOSIS — Z1159 Encounter for screening for other viral diseases: Secondary | ICD-10-CM | POA: Diagnosis not present

## 2020-09-04 DIAGNOSIS — Z20828 Contact with and (suspected) exposure to other viral communicable diseases: Secondary | ICD-10-CM | POA: Diagnosis not present

## 2020-09-04 DIAGNOSIS — M25561 Pain in right knee: Secondary | ICD-10-CM | POA: Diagnosis not present

## 2020-09-04 DIAGNOSIS — Z9181 History of falling: Secondary | ICD-10-CM | POA: Diagnosis not present

## 2020-09-04 DIAGNOSIS — M25562 Pain in left knee: Secondary | ICD-10-CM | POA: Diagnosis not present

## 2020-09-04 DIAGNOSIS — Z1159 Encounter for screening for other viral diseases: Secondary | ICD-10-CM | POA: Diagnosis not present

## 2020-09-04 DIAGNOSIS — H541224 Low vision right eye category 2, blindness left eye category 4: Secondary | ICD-10-CM | POA: Diagnosis not present

## 2020-09-04 DIAGNOSIS — M25551 Pain in right hip: Secondary | ICD-10-CM | POA: Diagnosis not present

## 2020-09-04 DIAGNOSIS — M25552 Pain in left hip: Secondary | ICD-10-CM | POA: Diagnosis not present

## 2020-09-04 DIAGNOSIS — H5410 Blindness, one eye, low vision other eye, unspecified eyes: Secondary | ICD-10-CM | POA: Diagnosis not present

## 2020-09-06 DIAGNOSIS — H541224 Low vision right eye category 2, blindness left eye category 4: Secondary | ICD-10-CM | POA: Diagnosis not present

## 2020-09-06 DIAGNOSIS — H5410 Blindness, one eye, low vision other eye, unspecified eyes: Secondary | ICD-10-CM | POA: Diagnosis not present

## 2020-09-06 DIAGNOSIS — M25552 Pain in left hip: Secondary | ICD-10-CM | POA: Diagnosis not present

## 2020-09-06 DIAGNOSIS — M25561 Pain in right knee: Secondary | ICD-10-CM | POA: Diagnosis not present

## 2020-09-06 DIAGNOSIS — M25562 Pain in left knee: Secondary | ICD-10-CM | POA: Diagnosis not present

## 2020-09-06 DIAGNOSIS — Z9181 History of falling: Secondary | ICD-10-CM | POA: Diagnosis not present

## 2020-09-06 DIAGNOSIS — M25551 Pain in right hip: Secondary | ICD-10-CM | POA: Diagnosis not present

## 2020-09-07 DIAGNOSIS — Z20828 Contact with and (suspected) exposure to other viral communicable diseases: Secondary | ICD-10-CM | POA: Diagnosis not present

## 2020-09-07 DIAGNOSIS — Z1159 Encounter for screening for other viral diseases: Secondary | ICD-10-CM | POA: Diagnosis not present

## 2020-09-08 DIAGNOSIS — Z9181 History of falling: Secondary | ICD-10-CM | POA: Diagnosis not present

## 2020-09-08 DIAGNOSIS — M25561 Pain in right knee: Secondary | ICD-10-CM | POA: Diagnosis not present

## 2020-09-08 DIAGNOSIS — M25552 Pain in left hip: Secondary | ICD-10-CM | POA: Diagnosis not present

## 2020-09-08 DIAGNOSIS — H541224 Low vision right eye category 2, blindness left eye category 4: Secondary | ICD-10-CM | POA: Diagnosis not present

## 2020-09-08 DIAGNOSIS — H5410 Blindness, one eye, low vision other eye, unspecified eyes: Secondary | ICD-10-CM | POA: Diagnosis not present

## 2020-09-08 DIAGNOSIS — M25551 Pain in right hip: Secondary | ICD-10-CM | POA: Diagnosis not present

## 2020-09-08 DIAGNOSIS — M25562 Pain in left knee: Secondary | ICD-10-CM | POA: Diagnosis not present

## 2020-09-11 DIAGNOSIS — M25551 Pain in right hip: Secondary | ICD-10-CM | POA: Diagnosis not present

## 2020-09-11 DIAGNOSIS — M25562 Pain in left knee: Secondary | ICD-10-CM | POA: Diagnosis not present

## 2020-09-11 DIAGNOSIS — H541224 Low vision right eye category 2, blindness left eye category 4: Secondary | ICD-10-CM | POA: Diagnosis not present

## 2020-09-11 DIAGNOSIS — H5410 Blindness, one eye, low vision other eye, unspecified eyes: Secondary | ICD-10-CM | POA: Diagnosis not present

## 2020-09-11 DIAGNOSIS — Z1159 Encounter for screening for other viral diseases: Secondary | ICD-10-CM | POA: Diagnosis not present

## 2020-09-11 DIAGNOSIS — Z20828 Contact with and (suspected) exposure to other viral communicable diseases: Secondary | ICD-10-CM | POA: Diagnosis not present

## 2020-09-11 DIAGNOSIS — Z9181 History of falling: Secondary | ICD-10-CM | POA: Diagnosis not present

## 2020-09-11 DIAGNOSIS — M25552 Pain in left hip: Secondary | ICD-10-CM | POA: Diagnosis not present

## 2020-09-11 DIAGNOSIS — M25561 Pain in right knee: Secondary | ICD-10-CM | POA: Diagnosis not present

## 2020-09-12 DIAGNOSIS — N183 Chronic kidney disease, stage 3 unspecified: Secondary | ICD-10-CM | POA: Diagnosis not present

## 2020-09-12 DIAGNOSIS — J45909 Unspecified asthma, uncomplicated: Secondary | ICD-10-CM | POA: Diagnosis not present

## 2020-09-12 DIAGNOSIS — E1122 Type 2 diabetes mellitus with diabetic chronic kidney disease: Secondary | ICD-10-CM | POA: Diagnosis not present

## 2020-09-12 DIAGNOSIS — I5022 Chronic systolic (congestive) heart failure: Secondary | ICD-10-CM | POA: Diagnosis not present

## 2020-09-12 DIAGNOSIS — E1142 Type 2 diabetes mellitus with diabetic polyneuropathy: Secondary | ICD-10-CM | POA: Diagnosis not present

## 2020-09-12 DIAGNOSIS — R296 Repeated falls: Secondary | ICD-10-CM | POA: Diagnosis not present

## 2020-09-12 DIAGNOSIS — M810 Age-related osteoporosis without current pathological fracture: Secondary | ICD-10-CM | POA: Diagnosis not present

## 2020-09-13 DIAGNOSIS — M25562 Pain in left knee: Secondary | ICD-10-CM | POA: Diagnosis not present

## 2020-09-13 DIAGNOSIS — H5410 Blindness, one eye, low vision other eye, unspecified eyes: Secondary | ICD-10-CM | POA: Diagnosis not present

## 2020-09-13 DIAGNOSIS — Z9181 History of falling: Secondary | ICD-10-CM | POA: Diagnosis not present

## 2020-09-13 DIAGNOSIS — H541224 Low vision right eye category 2, blindness left eye category 4: Secondary | ICD-10-CM | POA: Diagnosis not present

## 2020-09-13 DIAGNOSIS — M25551 Pain in right hip: Secondary | ICD-10-CM | POA: Diagnosis not present

## 2020-09-13 DIAGNOSIS — M25561 Pain in right knee: Secondary | ICD-10-CM | POA: Diagnosis not present

## 2020-09-13 DIAGNOSIS — M25552 Pain in left hip: Secondary | ICD-10-CM | POA: Diagnosis not present

## 2020-09-14 DIAGNOSIS — Z20828 Contact with and (suspected) exposure to other viral communicable diseases: Secondary | ICD-10-CM | POA: Diagnosis not present

## 2020-09-14 DIAGNOSIS — Z1159 Encounter for screening for other viral diseases: Secondary | ICD-10-CM | POA: Diagnosis not present

## 2020-09-15 DIAGNOSIS — Z9181 History of falling: Secondary | ICD-10-CM | POA: Diagnosis not present

## 2020-09-15 DIAGNOSIS — M25551 Pain in right hip: Secondary | ICD-10-CM | POA: Diagnosis not present

## 2020-09-15 DIAGNOSIS — H5410 Blindness, one eye, low vision other eye, unspecified eyes: Secondary | ICD-10-CM | POA: Diagnosis not present

## 2020-09-15 DIAGNOSIS — H541224 Low vision right eye category 2, blindness left eye category 4: Secondary | ICD-10-CM | POA: Diagnosis not present

## 2020-09-15 DIAGNOSIS — M25552 Pain in left hip: Secondary | ICD-10-CM | POA: Diagnosis not present

## 2020-09-15 DIAGNOSIS — M25562 Pain in left knee: Secondary | ICD-10-CM | POA: Diagnosis not present

## 2020-09-15 DIAGNOSIS — M25561 Pain in right knee: Secondary | ICD-10-CM | POA: Diagnosis not present

## 2020-09-18 DIAGNOSIS — Z20828 Contact with and (suspected) exposure to other viral communicable diseases: Secondary | ICD-10-CM | POA: Diagnosis not present

## 2020-09-18 DIAGNOSIS — Z1159 Encounter for screening for other viral diseases: Secondary | ICD-10-CM | POA: Diagnosis not present

## 2020-09-18 DIAGNOSIS — H5410 Blindness, one eye, low vision other eye, unspecified eyes: Secondary | ICD-10-CM | POA: Diagnosis not present

## 2020-09-18 DIAGNOSIS — M25552 Pain in left hip: Secondary | ICD-10-CM | POA: Diagnosis not present

## 2020-09-18 DIAGNOSIS — H541224 Low vision right eye category 2, blindness left eye category 4: Secondary | ICD-10-CM | POA: Diagnosis not present

## 2020-09-18 DIAGNOSIS — Z9181 History of falling: Secondary | ICD-10-CM | POA: Diagnosis not present

## 2020-09-18 DIAGNOSIS — M25551 Pain in right hip: Secondary | ICD-10-CM | POA: Diagnosis not present

## 2020-09-18 DIAGNOSIS — M25562 Pain in left knee: Secondary | ICD-10-CM | POA: Diagnosis not present

## 2020-09-18 DIAGNOSIS — M25561 Pain in right knee: Secondary | ICD-10-CM | POA: Diagnosis not present

## 2020-09-19 DIAGNOSIS — M25552 Pain in left hip: Secondary | ICD-10-CM | POA: Diagnosis not present

## 2020-09-19 DIAGNOSIS — Z9181 History of falling: Secondary | ICD-10-CM | POA: Diagnosis not present

## 2020-09-19 DIAGNOSIS — M25561 Pain in right knee: Secondary | ICD-10-CM | POA: Diagnosis not present

## 2020-09-19 DIAGNOSIS — M25562 Pain in left knee: Secondary | ICD-10-CM | POA: Diagnosis not present

## 2020-09-19 DIAGNOSIS — M25551 Pain in right hip: Secondary | ICD-10-CM | POA: Diagnosis not present

## 2020-09-19 DIAGNOSIS — H5410 Blindness, one eye, low vision other eye, unspecified eyes: Secondary | ICD-10-CM | POA: Diagnosis not present

## 2020-09-19 DIAGNOSIS — H541224 Low vision right eye category 2, blindness left eye category 4: Secondary | ICD-10-CM | POA: Diagnosis not present

## 2020-09-20 DIAGNOSIS — M25561 Pain in right knee: Secondary | ICD-10-CM | POA: Diagnosis not present

## 2020-09-20 DIAGNOSIS — H5410 Blindness, one eye, low vision other eye, unspecified eyes: Secondary | ICD-10-CM | POA: Diagnosis not present

## 2020-09-20 DIAGNOSIS — H541224 Low vision right eye category 2, blindness left eye category 4: Secondary | ICD-10-CM | POA: Diagnosis not present

## 2020-09-20 DIAGNOSIS — M25562 Pain in left knee: Secondary | ICD-10-CM | POA: Diagnosis not present

## 2020-09-20 DIAGNOSIS — Z9181 History of falling: Secondary | ICD-10-CM | POA: Diagnosis not present

## 2020-09-20 DIAGNOSIS — M25551 Pain in right hip: Secondary | ICD-10-CM | POA: Diagnosis not present

## 2020-09-20 DIAGNOSIS — M25552 Pain in left hip: Secondary | ICD-10-CM | POA: Diagnosis not present

## 2020-09-21 DIAGNOSIS — Z20828 Contact with and (suspected) exposure to other viral communicable diseases: Secondary | ICD-10-CM | POA: Diagnosis not present

## 2020-09-21 DIAGNOSIS — Z1159 Encounter for screening for other viral diseases: Secondary | ICD-10-CM | POA: Diagnosis not present

## 2020-09-22 DIAGNOSIS — M25551 Pain in right hip: Secondary | ICD-10-CM | POA: Diagnosis not present

## 2020-09-22 DIAGNOSIS — H541224 Low vision right eye category 2, blindness left eye category 4: Secondary | ICD-10-CM | POA: Diagnosis not present

## 2020-09-22 DIAGNOSIS — M25562 Pain in left knee: Secondary | ICD-10-CM | POA: Diagnosis not present

## 2020-09-22 DIAGNOSIS — M25552 Pain in left hip: Secondary | ICD-10-CM | POA: Diagnosis not present

## 2020-09-22 DIAGNOSIS — H5410 Blindness, one eye, low vision other eye, unspecified eyes: Secondary | ICD-10-CM | POA: Diagnosis not present

## 2020-09-22 DIAGNOSIS — Z9181 History of falling: Secondary | ICD-10-CM | POA: Diagnosis not present

## 2020-09-22 DIAGNOSIS — M25561 Pain in right knee: Secondary | ICD-10-CM | POA: Diagnosis not present

## 2020-09-25 DIAGNOSIS — Z20828 Contact with and (suspected) exposure to other viral communicable diseases: Secondary | ICD-10-CM | POA: Diagnosis not present

## 2020-09-25 DIAGNOSIS — H5410 Blindness, one eye, low vision other eye, unspecified eyes: Secondary | ICD-10-CM | POA: Diagnosis not present

## 2020-09-25 DIAGNOSIS — M25552 Pain in left hip: Secondary | ICD-10-CM | POA: Diagnosis not present

## 2020-09-25 DIAGNOSIS — Z9181 History of falling: Secondary | ICD-10-CM | POA: Diagnosis not present

## 2020-09-25 DIAGNOSIS — Z1159 Encounter for screening for other viral diseases: Secondary | ICD-10-CM | POA: Diagnosis not present

## 2020-09-25 DIAGNOSIS — H541224 Low vision right eye category 2, blindness left eye category 4: Secondary | ICD-10-CM | POA: Diagnosis not present

## 2020-09-25 DIAGNOSIS — M25551 Pain in right hip: Secondary | ICD-10-CM | POA: Diagnosis not present

## 2020-09-25 DIAGNOSIS — M25562 Pain in left knee: Secondary | ICD-10-CM | POA: Diagnosis not present

## 2020-09-25 DIAGNOSIS — M25561 Pain in right knee: Secondary | ICD-10-CM | POA: Diagnosis not present

## 2020-09-27 DIAGNOSIS — H541224 Low vision right eye category 2, blindness left eye category 4: Secondary | ICD-10-CM | POA: Diagnosis not present

## 2020-09-27 DIAGNOSIS — H5410 Blindness, one eye, low vision other eye, unspecified eyes: Secondary | ICD-10-CM | POA: Diagnosis not present

## 2020-09-27 DIAGNOSIS — M25562 Pain in left knee: Secondary | ICD-10-CM | POA: Diagnosis not present

## 2020-09-27 DIAGNOSIS — M25561 Pain in right knee: Secondary | ICD-10-CM | POA: Diagnosis not present

## 2020-09-27 DIAGNOSIS — M25552 Pain in left hip: Secondary | ICD-10-CM | POA: Diagnosis not present

## 2020-09-27 DIAGNOSIS — Z9181 History of falling: Secondary | ICD-10-CM | POA: Diagnosis not present

## 2020-09-27 DIAGNOSIS — M25551 Pain in right hip: Secondary | ICD-10-CM | POA: Diagnosis not present

## 2020-09-28 DIAGNOSIS — Z1159 Encounter for screening for other viral diseases: Secondary | ICD-10-CM | POA: Diagnosis not present

## 2020-09-28 DIAGNOSIS — Z9181 History of falling: Secondary | ICD-10-CM | POA: Diagnosis not present

## 2020-09-28 DIAGNOSIS — M25561 Pain in right knee: Secondary | ICD-10-CM | POA: Diagnosis not present

## 2020-09-28 DIAGNOSIS — H541224 Low vision right eye category 2, blindness left eye category 4: Secondary | ICD-10-CM | POA: Diagnosis not present

## 2020-09-28 DIAGNOSIS — M25551 Pain in right hip: Secondary | ICD-10-CM | POA: Diagnosis not present

## 2020-09-28 DIAGNOSIS — M25562 Pain in left knee: Secondary | ICD-10-CM | POA: Diagnosis not present

## 2020-09-28 DIAGNOSIS — M25552 Pain in left hip: Secondary | ICD-10-CM | POA: Diagnosis not present

## 2020-09-28 DIAGNOSIS — Z20828 Contact with and (suspected) exposure to other viral communicable diseases: Secondary | ICD-10-CM | POA: Diagnosis not present

## 2020-09-28 DIAGNOSIS — H5410 Blindness, one eye, low vision other eye, unspecified eyes: Secondary | ICD-10-CM | POA: Diagnosis not present

## 2020-10-02 DIAGNOSIS — H541224 Low vision right eye category 2, blindness left eye category 4: Secondary | ICD-10-CM | POA: Diagnosis not present

## 2020-10-02 DIAGNOSIS — Z1159 Encounter for screening for other viral diseases: Secondary | ICD-10-CM | POA: Diagnosis not present

## 2020-10-02 DIAGNOSIS — M25561 Pain in right knee: Secondary | ICD-10-CM | POA: Diagnosis not present

## 2020-10-02 DIAGNOSIS — Z20828 Contact with and (suspected) exposure to other viral communicable diseases: Secondary | ICD-10-CM | POA: Diagnosis not present

## 2020-10-02 DIAGNOSIS — H5410 Blindness, one eye, low vision other eye, unspecified eyes: Secondary | ICD-10-CM | POA: Diagnosis not present

## 2020-10-02 DIAGNOSIS — M25552 Pain in left hip: Secondary | ICD-10-CM | POA: Diagnosis not present

## 2020-10-02 DIAGNOSIS — M25551 Pain in right hip: Secondary | ICD-10-CM | POA: Diagnosis not present

## 2020-10-02 DIAGNOSIS — Z9181 History of falling: Secondary | ICD-10-CM | POA: Diagnosis not present

## 2020-10-02 DIAGNOSIS — M25562 Pain in left knee: Secondary | ICD-10-CM | POA: Diagnosis not present

## 2020-10-04 DIAGNOSIS — H5410 Blindness, one eye, low vision other eye, unspecified eyes: Secondary | ICD-10-CM | POA: Diagnosis not present

## 2020-10-04 DIAGNOSIS — M25551 Pain in right hip: Secondary | ICD-10-CM | POA: Diagnosis not present

## 2020-10-04 DIAGNOSIS — Z9181 History of falling: Secondary | ICD-10-CM | POA: Diagnosis not present

## 2020-10-04 DIAGNOSIS — M25561 Pain in right knee: Secondary | ICD-10-CM | POA: Diagnosis not present

## 2020-10-04 DIAGNOSIS — M25552 Pain in left hip: Secondary | ICD-10-CM | POA: Diagnosis not present

## 2020-10-04 DIAGNOSIS — H541224 Low vision right eye category 2, blindness left eye category 4: Secondary | ICD-10-CM | POA: Diagnosis not present

## 2020-10-04 DIAGNOSIS — M25562 Pain in left knee: Secondary | ICD-10-CM | POA: Diagnosis not present

## 2020-10-09 DIAGNOSIS — Z1159 Encounter for screening for other viral diseases: Secondary | ICD-10-CM | POA: Diagnosis not present

## 2020-10-09 DIAGNOSIS — M25551 Pain in right hip: Secondary | ICD-10-CM | POA: Diagnosis not present

## 2020-10-09 DIAGNOSIS — M25562 Pain in left knee: Secondary | ICD-10-CM | POA: Diagnosis not present

## 2020-10-09 DIAGNOSIS — H5410 Blindness, one eye, low vision other eye, unspecified eyes: Secondary | ICD-10-CM | POA: Diagnosis not present

## 2020-10-09 DIAGNOSIS — Z20828 Contact with and (suspected) exposure to other viral communicable diseases: Secondary | ICD-10-CM | POA: Diagnosis not present

## 2020-10-09 DIAGNOSIS — M25561 Pain in right knee: Secondary | ICD-10-CM | POA: Diagnosis not present

## 2020-10-09 DIAGNOSIS — Z9181 History of falling: Secondary | ICD-10-CM | POA: Diagnosis not present

## 2020-10-09 DIAGNOSIS — H541224 Low vision right eye category 2, blindness left eye category 4: Secondary | ICD-10-CM | POA: Diagnosis not present

## 2020-10-09 DIAGNOSIS — M25552 Pain in left hip: Secondary | ICD-10-CM | POA: Diagnosis not present

## 2020-10-11 DIAGNOSIS — Z9181 History of falling: Secondary | ICD-10-CM | POA: Diagnosis not present

## 2020-10-11 DIAGNOSIS — H5410 Blindness, one eye, low vision other eye, unspecified eyes: Secondary | ICD-10-CM | POA: Diagnosis not present

## 2020-10-11 DIAGNOSIS — M25562 Pain in left knee: Secondary | ICD-10-CM | POA: Diagnosis not present

## 2020-10-11 DIAGNOSIS — M25551 Pain in right hip: Secondary | ICD-10-CM | POA: Diagnosis not present

## 2020-10-11 DIAGNOSIS — M25561 Pain in right knee: Secondary | ICD-10-CM | POA: Diagnosis not present

## 2020-10-11 DIAGNOSIS — M25552 Pain in left hip: Secondary | ICD-10-CM | POA: Diagnosis not present

## 2020-10-11 DIAGNOSIS — H541224 Low vision right eye category 2, blindness left eye category 4: Secondary | ICD-10-CM | POA: Diagnosis not present

## 2020-10-13 DIAGNOSIS — R296 Repeated falls: Secondary | ICD-10-CM | POA: Diagnosis not present

## 2020-10-13 DIAGNOSIS — J45909 Unspecified asthma, uncomplicated: Secondary | ICD-10-CM | POA: Diagnosis not present

## 2020-10-13 DIAGNOSIS — M810 Age-related osteoporosis without current pathological fracture: Secondary | ICD-10-CM | POA: Diagnosis not present

## 2020-10-13 DIAGNOSIS — E1122 Type 2 diabetes mellitus with diabetic chronic kidney disease: Secondary | ICD-10-CM | POA: Diagnosis not present

## 2020-10-13 DIAGNOSIS — E1142 Type 2 diabetes mellitus with diabetic polyneuropathy: Secondary | ICD-10-CM | POA: Diagnosis not present

## 2020-10-13 DIAGNOSIS — N183 Chronic kidney disease, stage 3 unspecified: Secondary | ICD-10-CM | POA: Diagnosis not present

## 2020-10-13 DIAGNOSIS — I5022 Chronic systolic (congestive) heart failure: Secondary | ICD-10-CM | POA: Diagnosis not present

## 2020-10-16 DIAGNOSIS — Z20828 Contact with and (suspected) exposure to other viral communicable diseases: Secondary | ICD-10-CM | POA: Diagnosis not present

## 2020-10-16 DIAGNOSIS — M25551 Pain in right hip: Secondary | ICD-10-CM | POA: Diagnosis not present

## 2020-10-16 DIAGNOSIS — M25562 Pain in left knee: Secondary | ICD-10-CM | POA: Diagnosis not present

## 2020-10-16 DIAGNOSIS — H541224 Low vision right eye category 2, blindness left eye category 4: Secondary | ICD-10-CM | POA: Diagnosis not present

## 2020-10-16 DIAGNOSIS — M25561 Pain in right knee: Secondary | ICD-10-CM | POA: Diagnosis not present

## 2020-10-16 DIAGNOSIS — M25552 Pain in left hip: Secondary | ICD-10-CM | POA: Diagnosis not present

## 2020-10-16 DIAGNOSIS — H5410 Blindness, one eye, low vision other eye, unspecified eyes: Secondary | ICD-10-CM | POA: Diagnosis not present

## 2020-10-16 DIAGNOSIS — Z9181 History of falling: Secondary | ICD-10-CM | POA: Diagnosis not present

## 2020-10-16 DIAGNOSIS — Z1159 Encounter for screening for other viral diseases: Secondary | ICD-10-CM | POA: Diagnosis not present

## 2020-10-18 DIAGNOSIS — M25551 Pain in right hip: Secondary | ICD-10-CM | POA: Diagnosis not present

## 2020-10-18 DIAGNOSIS — M25552 Pain in left hip: Secondary | ICD-10-CM | POA: Diagnosis not present

## 2020-10-18 DIAGNOSIS — M25562 Pain in left knee: Secondary | ICD-10-CM | POA: Diagnosis not present

## 2020-10-18 DIAGNOSIS — M25561 Pain in right knee: Secondary | ICD-10-CM | POA: Diagnosis not present

## 2020-10-19 ENCOUNTER — Ambulatory Visit
Admission: RE | Admit: 2020-10-19 | Discharge: 2020-10-19 | Disposition: A | Discharge status: Home / Self Care | Payer: Medicare HMO | Source: Ambulatory Visit | Attending: Family Medicine | Admitting: Family Medicine

## 2020-10-19 ENCOUNTER — Other Ambulatory Visit: Payer: Self-pay | Admitting: Family Medicine

## 2020-10-19 DIAGNOSIS — F322 Major depressive disorder, single episode, severe without psychotic features: Secondary | ICD-10-CM | POA: Diagnosis not present

## 2020-10-19 DIAGNOSIS — R109 Unspecified abdominal pain: Secondary | ICD-10-CM

## 2020-10-19 DIAGNOSIS — R059 Cough, unspecified: Secondary | ICD-10-CM | POA: Diagnosis not present

## 2020-10-19 DIAGNOSIS — J449 Chronic obstructive pulmonary disease, unspecified: Secondary | ICD-10-CM | POA: Diagnosis not present

## 2020-10-19 DIAGNOSIS — I130 Hypertensive heart and chronic kidney disease with heart failure and stage 1 through stage 4 chronic kidney disease, or unspecified chronic kidney disease: Secondary | ICD-10-CM | POA: Diagnosis not present

## 2020-10-19 DIAGNOSIS — D692 Other nonthrombocytopenic purpura: Secondary | ICD-10-CM | POA: Diagnosis not present

## 2020-10-19 DIAGNOSIS — E1122 Type 2 diabetes mellitus with diabetic chronic kidney disease: Secondary | ICD-10-CM | POA: Diagnosis not present

## 2020-10-19 DIAGNOSIS — H9190 Unspecified hearing loss, unspecified ear: Secondary | ICD-10-CM | POA: Diagnosis not present

## 2020-10-19 DIAGNOSIS — E039 Hypothyroidism, unspecified: Secondary | ICD-10-CM | POA: Diagnosis not present

## 2020-10-19 DIAGNOSIS — F411 Generalized anxiety disorder: Secondary | ICD-10-CM | POA: Diagnosis not present

## 2020-10-19 DIAGNOSIS — H3530 Unspecified macular degeneration: Secondary | ICD-10-CM | POA: Diagnosis not present

## 2020-10-23 DIAGNOSIS — Z20828 Contact with and (suspected) exposure to other viral communicable diseases: Secondary | ICD-10-CM | POA: Diagnosis not present

## 2020-10-23 DIAGNOSIS — M25562 Pain in left knee: Secondary | ICD-10-CM | POA: Diagnosis not present

## 2020-10-23 DIAGNOSIS — M25552 Pain in left hip: Secondary | ICD-10-CM | POA: Diagnosis not present

## 2020-10-23 DIAGNOSIS — M25561 Pain in right knee: Secondary | ICD-10-CM | POA: Diagnosis not present

## 2020-10-23 DIAGNOSIS — Z1159 Encounter for screening for other viral diseases: Secondary | ICD-10-CM | POA: Diagnosis not present

## 2020-10-23 DIAGNOSIS — M25551 Pain in right hip: Secondary | ICD-10-CM | POA: Diagnosis not present

## 2020-10-25 DIAGNOSIS — M25551 Pain in right hip: Secondary | ICD-10-CM | POA: Diagnosis not present

## 2020-10-25 DIAGNOSIS — M25562 Pain in left knee: Secondary | ICD-10-CM | POA: Diagnosis not present

## 2020-10-25 DIAGNOSIS — M25552 Pain in left hip: Secondary | ICD-10-CM | POA: Diagnosis not present

## 2020-10-25 DIAGNOSIS — M25561 Pain in right knee: Secondary | ICD-10-CM | POA: Diagnosis not present

## 2020-10-26 DIAGNOSIS — Z20828 Contact with and (suspected) exposure to other viral communicable diseases: Secondary | ICD-10-CM | POA: Diagnosis not present

## 2020-10-26 DIAGNOSIS — Z1159 Encounter for screening for other viral diseases: Secondary | ICD-10-CM | POA: Diagnosis not present

## 2020-10-30 DIAGNOSIS — Z1159 Encounter for screening for other viral diseases: Secondary | ICD-10-CM | POA: Diagnosis not present

## 2020-10-30 DIAGNOSIS — Z20828 Contact with and (suspected) exposure to other viral communicable diseases: Secondary | ICD-10-CM | POA: Diagnosis not present

## 2020-11-01 DIAGNOSIS — M25551 Pain in right hip: Secondary | ICD-10-CM | POA: Diagnosis not present

## 2020-11-01 DIAGNOSIS — M25561 Pain in right knee: Secondary | ICD-10-CM | POA: Diagnosis not present

## 2020-11-01 DIAGNOSIS — M25562 Pain in left knee: Secondary | ICD-10-CM | POA: Diagnosis not present

## 2020-11-01 DIAGNOSIS — M25552 Pain in left hip: Secondary | ICD-10-CM | POA: Diagnosis not present

## 2020-11-02 DIAGNOSIS — Z20828 Contact with and (suspected) exposure to other viral communicable diseases: Secondary | ICD-10-CM | POA: Diagnosis not present

## 2020-11-02 DIAGNOSIS — Z1159 Encounter for screening for other viral diseases: Secondary | ICD-10-CM | POA: Diagnosis not present

## 2020-11-06 DIAGNOSIS — Z20828 Contact with and (suspected) exposure to other viral communicable diseases: Secondary | ICD-10-CM | POA: Diagnosis not present

## 2020-11-06 DIAGNOSIS — Z1159 Encounter for screening for other viral diseases: Secondary | ICD-10-CM | POA: Diagnosis not present

## 2020-11-08 DIAGNOSIS — M25561 Pain in right knee: Secondary | ICD-10-CM | POA: Diagnosis not present

## 2020-11-08 DIAGNOSIS — M25551 Pain in right hip: Secondary | ICD-10-CM | POA: Diagnosis not present

## 2020-11-08 DIAGNOSIS — M25562 Pain in left knee: Secondary | ICD-10-CM | POA: Diagnosis not present

## 2020-11-08 DIAGNOSIS — M25552 Pain in left hip: Secondary | ICD-10-CM | POA: Diagnosis not present

## 2020-11-09 DIAGNOSIS — Z1159 Encounter for screening for other viral diseases: Secondary | ICD-10-CM | POA: Diagnosis not present

## 2020-11-09 DIAGNOSIS — Z20828 Contact with and (suspected) exposure to other viral communicable diseases: Secondary | ICD-10-CM | POA: Diagnosis not present

## 2020-11-12 DIAGNOSIS — I5022 Chronic systolic (congestive) heart failure: Secondary | ICD-10-CM | POA: Diagnosis not present

## 2020-11-12 DIAGNOSIS — E1122 Type 2 diabetes mellitus with diabetic chronic kidney disease: Secondary | ICD-10-CM | POA: Diagnosis not present

## 2020-11-12 DIAGNOSIS — J45909 Unspecified asthma, uncomplicated: Secondary | ICD-10-CM | POA: Diagnosis not present

## 2020-11-12 DIAGNOSIS — N183 Chronic kidney disease, stage 3 unspecified: Secondary | ICD-10-CM | POA: Diagnosis not present

## 2020-11-12 DIAGNOSIS — R296 Repeated falls: Secondary | ICD-10-CM | POA: Diagnosis not present

## 2020-11-12 DIAGNOSIS — E1142 Type 2 diabetes mellitus with diabetic polyneuropathy: Secondary | ICD-10-CM | POA: Diagnosis not present

## 2020-11-12 DIAGNOSIS — M810 Age-related osteoporosis without current pathological fracture: Secondary | ICD-10-CM | POA: Diagnosis not present

## 2020-11-13 DIAGNOSIS — Z1159 Encounter for screening for other viral diseases: Secondary | ICD-10-CM | POA: Diagnosis not present

## 2020-11-13 DIAGNOSIS — Z20828 Contact with and (suspected) exposure to other viral communicable diseases: Secondary | ICD-10-CM | POA: Diagnosis not present

## 2020-11-15 DIAGNOSIS — M25561 Pain in right knee: Secondary | ICD-10-CM | POA: Diagnosis not present

## 2020-11-15 DIAGNOSIS — M25551 Pain in right hip: Secondary | ICD-10-CM | POA: Diagnosis not present

## 2020-11-15 DIAGNOSIS — M25562 Pain in left knee: Secondary | ICD-10-CM | POA: Diagnosis not present

## 2020-11-15 DIAGNOSIS — M25552 Pain in left hip: Secondary | ICD-10-CM | POA: Diagnosis not present

## 2020-11-16 DIAGNOSIS — Z20828 Contact with and (suspected) exposure to other viral communicable diseases: Secondary | ICD-10-CM | POA: Diagnosis not present

## 2020-11-16 DIAGNOSIS — Z1159 Encounter for screening for other viral diseases: Secondary | ICD-10-CM | POA: Diagnosis not present

## 2020-11-20 DIAGNOSIS — Z1159 Encounter for screening for other viral diseases: Secondary | ICD-10-CM | POA: Diagnosis not present

## 2020-11-20 DIAGNOSIS — Z20828 Contact with and (suspected) exposure to other viral communicable diseases: Secondary | ICD-10-CM | POA: Diagnosis not present

## 2020-11-22 DIAGNOSIS — M25562 Pain in left knee: Secondary | ICD-10-CM | POA: Diagnosis not present

## 2020-11-22 DIAGNOSIS — M25551 Pain in right hip: Secondary | ICD-10-CM | POA: Diagnosis not present

## 2020-11-22 DIAGNOSIS — M25552 Pain in left hip: Secondary | ICD-10-CM | POA: Diagnosis not present

## 2020-11-22 DIAGNOSIS — M25561 Pain in right knee: Secondary | ICD-10-CM | POA: Diagnosis not present

## 2020-11-23 DIAGNOSIS — Z1159 Encounter for screening for other viral diseases: Secondary | ICD-10-CM | POA: Diagnosis not present

## 2020-11-23 DIAGNOSIS — Z20828 Contact with and (suspected) exposure to other viral communicable diseases: Secondary | ICD-10-CM | POA: Diagnosis not present

## 2020-11-27 DIAGNOSIS — Z20828 Contact with and (suspected) exposure to other viral communicable diseases: Secondary | ICD-10-CM | POA: Diagnosis not present

## 2020-11-27 DIAGNOSIS — Z1159 Encounter for screening for other viral diseases: Secondary | ICD-10-CM | POA: Diagnosis not present

## 2020-11-30 DIAGNOSIS — Z20828 Contact with and (suspected) exposure to other viral communicable diseases: Secondary | ICD-10-CM | POA: Diagnosis not present

## 2020-11-30 DIAGNOSIS — Z1159 Encounter for screening for other viral diseases: Secondary | ICD-10-CM | POA: Diagnosis not present

## 2020-12-04 DIAGNOSIS — Z1159 Encounter for screening for other viral diseases: Secondary | ICD-10-CM | POA: Diagnosis not present

## 2020-12-04 DIAGNOSIS — Z20828 Contact with and (suspected) exposure to other viral communicable diseases: Secondary | ICD-10-CM | POA: Diagnosis not present

## 2020-12-07 DIAGNOSIS — Z1159 Encounter for screening for other viral diseases: Secondary | ICD-10-CM | POA: Diagnosis not present

## 2020-12-07 DIAGNOSIS — Z20828 Contact with and (suspected) exposure to other viral communicable diseases: Secondary | ICD-10-CM | POA: Diagnosis not present

## 2020-12-11 DIAGNOSIS — Z1159 Encounter for screening for other viral diseases: Secondary | ICD-10-CM | POA: Diagnosis not present

## 2020-12-11 DIAGNOSIS — Z20828 Contact with and (suspected) exposure to other viral communicable diseases: Secondary | ICD-10-CM | POA: Diagnosis not present

## 2020-12-13 DIAGNOSIS — E1142 Type 2 diabetes mellitus with diabetic polyneuropathy: Secondary | ICD-10-CM | POA: Diagnosis not present

## 2020-12-13 DIAGNOSIS — M810 Age-related osteoporosis without current pathological fracture: Secondary | ICD-10-CM | POA: Diagnosis not present

## 2020-12-13 DIAGNOSIS — E1122 Type 2 diabetes mellitus with diabetic chronic kidney disease: Secondary | ICD-10-CM | POA: Diagnosis not present

## 2020-12-13 DIAGNOSIS — R296 Repeated falls: Secondary | ICD-10-CM | POA: Diagnosis not present

## 2020-12-13 DIAGNOSIS — N183 Chronic kidney disease, stage 3 unspecified: Secondary | ICD-10-CM | POA: Diagnosis not present

## 2020-12-13 DIAGNOSIS — I5022 Chronic systolic (congestive) heart failure: Secondary | ICD-10-CM | POA: Diagnosis not present

## 2020-12-13 DIAGNOSIS — J45909 Unspecified asthma, uncomplicated: Secondary | ICD-10-CM | POA: Diagnosis not present

## 2020-12-14 DIAGNOSIS — Z1159 Encounter for screening for other viral diseases: Secondary | ICD-10-CM | POA: Diagnosis not present

## 2020-12-14 DIAGNOSIS — Z20828 Contact with and (suspected) exposure to other viral communicable diseases: Secondary | ICD-10-CM | POA: Diagnosis not present

## 2020-12-18 DIAGNOSIS — Z1159 Encounter for screening for other viral diseases: Secondary | ICD-10-CM | POA: Diagnosis not present

## 2020-12-18 DIAGNOSIS — Z20828 Contact with and (suspected) exposure to other viral communicable diseases: Secondary | ICD-10-CM | POA: Diagnosis not present

## 2020-12-21 DIAGNOSIS — Z1159 Encounter for screening for other viral diseases: Secondary | ICD-10-CM | POA: Diagnosis not present

## 2020-12-21 DIAGNOSIS — Z20828 Contact with and (suspected) exposure to other viral communicable diseases: Secondary | ICD-10-CM | POA: Diagnosis not present

## 2020-12-25 DIAGNOSIS — Z1159 Encounter for screening for other viral diseases: Secondary | ICD-10-CM | POA: Diagnosis not present

## 2020-12-25 DIAGNOSIS — Z20828 Contact with and (suspected) exposure to other viral communicable diseases: Secondary | ICD-10-CM | POA: Diagnosis not present

## 2020-12-28 DIAGNOSIS — Z20828 Contact with and (suspected) exposure to other viral communicable diseases: Secondary | ICD-10-CM | POA: Diagnosis not present

## 2020-12-28 DIAGNOSIS — Z1159 Encounter for screening for other viral diseases: Secondary | ICD-10-CM | POA: Diagnosis not present

## 2021-01-01 DIAGNOSIS — Z20828 Contact with and (suspected) exposure to other viral communicable diseases: Secondary | ICD-10-CM | POA: Diagnosis not present

## 2021-01-01 DIAGNOSIS — Z1159 Encounter for screening for other viral diseases: Secondary | ICD-10-CM | POA: Diagnosis not present

## 2021-01-04 DIAGNOSIS — Z1159 Encounter for screening for other viral diseases: Secondary | ICD-10-CM | POA: Diagnosis not present

## 2021-01-04 DIAGNOSIS — Z20828 Contact with and (suspected) exposure to other viral communicable diseases: Secondary | ICD-10-CM | POA: Diagnosis not present

## 2021-01-08 DIAGNOSIS — Z20828 Contact with and (suspected) exposure to other viral communicable diseases: Secondary | ICD-10-CM | POA: Diagnosis not present

## 2021-01-08 DIAGNOSIS — Z1159 Encounter for screening for other viral diseases: Secondary | ICD-10-CM | POA: Diagnosis not present

## 2021-01-11 DIAGNOSIS — Z20828 Contact with and (suspected) exposure to other viral communicable diseases: Secondary | ICD-10-CM | POA: Diagnosis not present

## 2021-01-11 DIAGNOSIS — Z1159 Encounter for screening for other viral diseases: Secondary | ICD-10-CM | POA: Diagnosis not present

## 2021-01-13 DIAGNOSIS — J45909 Unspecified asthma, uncomplicated: Secondary | ICD-10-CM | POA: Diagnosis not present

## 2021-01-13 DIAGNOSIS — R296 Repeated falls: Secondary | ICD-10-CM | POA: Diagnosis not present

## 2021-01-13 DIAGNOSIS — I5022 Chronic systolic (congestive) heart failure: Secondary | ICD-10-CM | POA: Diagnosis not present

## 2021-01-13 DIAGNOSIS — E1122 Type 2 diabetes mellitus with diabetic chronic kidney disease: Secondary | ICD-10-CM | POA: Diagnosis not present

## 2021-01-13 DIAGNOSIS — M810 Age-related osteoporosis without current pathological fracture: Secondary | ICD-10-CM | POA: Diagnosis not present

## 2021-01-13 DIAGNOSIS — N183 Chronic kidney disease, stage 3 unspecified: Secondary | ICD-10-CM | POA: Diagnosis not present

## 2021-01-13 DIAGNOSIS — E1142 Type 2 diabetes mellitus with diabetic polyneuropathy: Secondary | ICD-10-CM | POA: Diagnosis not present

## 2021-01-15 DIAGNOSIS — Z20828 Contact with and (suspected) exposure to other viral communicable diseases: Secondary | ICD-10-CM | POA: Diagnosis not present

## 2021-01-15 DIAGNOSIS — Z1159 Encounter for screening for other viral diseases: Secondary | ICD-10-CM | POA: Diagnosis not present

## 2021-01-18 DIAGNOSIS — Z1159 Encounter for screening for other viral diseases: Secondary | ICD-10-CM | POA: Diagnosis not present

## 2021-01-18 DIAGNOSIS — Z20828 Contact with and (suspected) exposure to other viral communicable diseases: Secondary | ICD-10-CM | POA: Diagnosis not present

## 2021-01-22 DIAGNOSIS — Z20828 Contact with and (suspected) exposure to other viral communicable diseases: Secondary | ICD-10-CM | POA: Diagnosis not present

## 2021-01-22 DIAGNOSIS — Z1159 Encounter for screening for other viral diseases: Secondary | ICD-10-CM | POA: Diagnosis not present

## 2021-01-23 DIAGNOSIS — M2012 Hallux valgus (acquired), left foot: Secondary | ICD-10-CM | POA: Diagnosis not present

## 2021-01-23 DIAGNOSIS — M79675 Pain in left toe(s): Secondary | ICD-10-CM | POA: Diagnosis not present

## 2021-01-23 DIAGNOSIS — B351 Tinea unguium: Secondary | ICD-10-CM | POA: Diagnosis not present

## 2021-01-23 DIAGNOSIS — E119 Type 2 diabetes mellitus without complications: Secondary | ICD-10-CM | POA: Diagnosis not present

## 2021-01-25 DIAGNOSIS — Z1159 Encounter for screening for other viral diseases: Secondary | ICD-10-CM | POA: Diagnosis not present

## 2021-01-25 DIAGNOSIS — Z20828 Contact with and (suspected) exposure to other viral communicable diseases: Secondary | ICD-10-CM | POA: Diagnosis not present

## 2021-01-29 DIAGNOSIS — Z1159 Encounter for screening for other viral diseases: Secondary | ICD-10-CM | POA: Diagnosis not present

## 2021-01-29 DIAGNOSIS — Z20828 Contact with and (suspected) exposure to other viral communicable diseases: Secondary | ICD-10-CM | POA: Diagnosis not present

## 2021-02-01 DIAGNOSIS — Z1159 Encounter for screening for other viral diseases: Secondary | ICD-10-CM | POA: Diagnosis not present

## 2021-02-01 DIAGNOSIS — Z20828 Contact with and (suspected) exposure to other viral communicable diseases: Secondary | ICD-10-CM | POA: Diagnosis not present

## 2021-02-08 ENCOUNTER — Other Ambulatory Visit: Payer: Self-pay

## 2021-02-08 ENCOUNTER — Non-Acute Institutional Stay: Payer: Medicare HMO | Admitting: Hospice

## 2021-02-08 DIAGNOSIS — I509 Heart failure, unspecified: Secondary | ICD-10-CM | POA: Diagnosis not present

## 2021-02-08 DIAGNOSIS — Z515 Encounter for palliative care: Secondary | ICD-10-CM | POA: Diagnosis not present

## 2021-02-08 DIAGNOSIS — G8929 Other chronic pain: Secondary | ICD-10-CM | POA: Diagnosis not present

## 2021-02-08 DIAGNOSIS — M25562 Pain in left knee: Secondary | ICD-10-CM | POA: Diagnosis not present

## 2021-02-08 NOTE — Progress Notes (Signed)
Therapist, nutritional Palliative Care Consult Note Telephone: [phone]  Fax: [phone]  PATIENT NAME: [name] DOB: 1923/07/26 MRN: [id number]  PRIMARY CARE PROVIDER:   [name], MD [name], MD [address] [place],  [place] [address]  REFERRING PROVIDER: [name], MD [name], MD [address] [place],  [place] [address]  RESPONSIBLE PARTY:self/[place] Gaynelle Adu - Niece. POA [phone]      Visit is to build trust and highlight Palliative Medicine as specialized medical care for people living with serious illness, aimed at facilitating better quality of life through symptoms relief, assisting with advance care plan and establishing goals of care.   CHIEF COMPLAINT: Palliative focused visit/left knee pain  RECOMMENDATIONS/PLAN:   1. Advance Care Planning/Code Status: CODE STATUS reviewed.  Patient is a DO NOT RESUSCITATE.  2. Goals of Care: Goals of care include to maximize quality of life and symptom management.  Visit consisted of counseling and education dealing with the complex and emotionally intense issues of symptom management and palliative care in the setting of serious and potentially life-threatening illness. Palliative care team will continue to support patient, patient's family, and medical team.  I spent 16 minutes providing this consultation. More than 50% of the time in this consultation was spent on coordinating communication.  -------------------------------------------------------------------------------------------------------------------------------------------------- 3. Symptom management/Plan:  Left knee pain: Continue 500 mg every night and 500 mg every 8 hours daily as needed for pain.  Continue ambulation/activity as tolerated.  Balance of rest and performance activity. Continue Aldactone Carvedilol as currently ordered for CHF. Palliative will continue to  monitor for symptom management/decline and make recommendations as needed. Return 2 months or prn. Encouraged to call provider sooner with any concerns.   HISTORY OF PRESENT ILLNESS:  [name] is a 87 y.o. female with multiple medical problems including left knee pain related to history of arthritis; it is acute on chronic, worsening in the past week, at night and after sitting for long.  She reports movement helps.  During visit she said pain was aching, a 6 out of 10 on a pain scale 0-10 where 0 is no pain and 10 is worst pain ever.  Left knee pain impairs abilities of daily living.  Nursing staff asked to give 500 mg Tylenol by mouth.  Patient was seen later and she said it was effective.  History of Depression, HTN, Afib, CHF, GERD, left eye blindness, low vision in the right eye. Palliative Care was asked to help address goals of care. History obtained from review of EMR, discussion with patient/family.   Review and summarization of Epic records shows history from other than patient. Rest of was point ROS asked and negative.  Palliative Care was asked to follow this patient by consultation request of [name], MD to help address complex decision making in the context of advance care planning and goals of care clarification.   CODE STATUS: DNR  PPS:  50%  HOSPICE ELIGIBILITY/DIAGNOSIS: TBD  PAST MEDICAL HISTORY:  Past Medical History:  Diagnosis Date  . Atrial fibrillation (HCC)   . Cholelithiasis   . Constipation   . Diabetes mellitus   . Dizziness    with some gait disability  . GERD (gastroesophageal reflux disease)   . Hepatic cyst   . Hyperlipidemia   . Hypertension   . Ischemic cardiomyopathy   . LBBB (left bundle branch block)   . Macular degeneration   . Other postprocedural  status(V45.89)   . Peripheral edema     SOCIAL HX: @SOCX  Patient at [facility] for ongoing care   FAMILY HX:  Family History  Problem Relation Age of Onset  . Leukemia Mother   .  Arthritis Father   . Heart failure Brother        died age 56  . Arthritis Brother     Review lab tests/diagnostics No results for input(s): WBC, HGB, HCT, PLT, MCV in the last 168 hours. No results for input(s): NA, K, CL, CO2, BUN, CREATININE, GLUCOSE in the last 168 hours. Latest GFR by Cockcroft Gault (not valid in AKI or ESRD) CrCl cannot be calculated (Patient's most recent lab result is older than the maximum 21 days allowed.). No results for input(s): AST, ALT, ALKPHOS, GGT in the last 168 hours.  Invalid input(s): TBILI, CONJBILI, ALB, TOTALPROTEIN No components found for: ALB No results for input(s): APTT, INR in the last 168 hours.  Invalid input(s): PTPATIENT No results for input(s): BNP, PROBNP in the last 168 hours.  ALLERGIES:  Allergies  Allergen Reactions  . Fosamax [Alendronate Sodium] Swelling  . Penicillins Swelling and Other (See Comments)    Swelling, redness and warmth at injection site Has patient had a PCN reaction causing immediate rash, facial/tongue/throat swelling, SOB or lightheadedness with hypotension: No Has patient had a PCN reaction causing severe rash involving mucus membranes or skin necrosis: No Has patient had a PCN reaction that required hospitalization: No Has patient had a PCN reaction occurring within the last 10 years: No If all of the above answers are "NO", then may proceed with Cephalosporin use.  . Sulfonamide Derivatives Swelling  . Tetanus Toxoid Swelling      PERTINENT MEDICATIONS:  Outpatient Encounter Medications as of 02/08/2021  Medication Sig  . acetaminophen (TYLENOL) 325 MG tablet Take 325 mg by mouth every 6 (six) hours as needed (pain).  02/10/2021 acetaminophen (TYLENOL) 500 MG tablet Take 500 mg by mouth every evening.  Marland Kitchen aspirin 81 MG chewable tablet Chew 81 mg by mouth daily.   . benzonatate (TESSALON) 100 MG capsule Take 100 mg by mouth 3 (three) times daily as needed for cough.  . carvedilol (COREG) 6.25 MG tablet Take  6.25 mg by mouth 2 (two) times daily.  . colestipol (COLESTID) 1 g tablet Take 2 tablets (2 g total) by mouth daily. Hold FOR CONSTIPATION (Patient not taking: Reported on 07/02/2019)  . diazepam (VALIUM) 5 MG tablet Take 2.5-5 mg by mouth See admin instructions. Take 2.5mg  by mouth in the morning and take 5mg  by mouth before bedtime  . guaifenesin (ROBITUSSIN) 100 MG/5ML syrup Take 200 mg by mouth every 8 (eight) hours as needed for cough.  . lactobacillus acidophilus & bulgar (LACTINEX) chewable tablet Chew 1 tablet by mouth daily.  07/04/2019 levothyroxine (SYNTHROID, LEVOTHROID) 25 MCG tablet Take 25 mcg by mouth daily before breakfast.  . loperamide (IMODIUM A-D) 2 MG tablet Take 2-4 mg by mouth See admin instructions. Take 2 tablets (4 mg) by mouth as needed after 1st loose stool followed by 1 tablet (2 mg) after each subsequent loose stool.  . magnesium hydroxide (MILK OF MAGNESIA) 400 MG/5ML suspension Take 15 mLs by mouth daily as needed (constipation).   . mirtazapine (REMERON) 30 MG tablet Take 30 mg by mouth at bedtime.   . Multiple Vitamins-Minerals (PRESERVISION AREDS PO) Take 1 tablet by mouth daily.   omeprazole (PRILOSEC) 20 MG capsule Take 20 mg by mouth daily.  . ondansetron (  ZOFRAN) 4 MG tablet Take 4 mg by mouth daily as needed for nausea.   . sertraline (ZOLOFT) 100 MG tablet Take 100 mg by mouth daily.  . simvastatin (ZOCOR) 40 MG tablet Take 40 mg by mouth daily.  Marland Kitchen spironolactone (ALDACTONE) 25 MG tablet Take 0.5 tablets (12.5 mg total) by mouth daily.  Marland Kitchen torsemide (DEMADEX) 20 MG tablet Take 20 mg by mouth daily.   No facility-administered encounter medications on file as of 02/08/2021.    ROS  General: NAD EYES: No vision changes, left eye blindness ENMT: No dysphagia no xerostomia Cardiovascular: No chest pain Pulmonary: No cough, SOB  Abdomen:no constipation or diarrhea GU: No dysuria or urinary frequency MSK:   ROM limitations, pain to left knee Skin: No rashes or  wounds Neurological: weakness Psych:  positive mood Heme/lymph/immuno: No bruises or abnormal bleeding   PHYSICAL EXAM  General: In no acute distress,  Cardiovascular: regular rate and rhythm; no edema in BLE Pulmonary: no cough, no increased work of breathing, normal respiratory effort Abdomen: soft, non tender, positive bowel sounds in all quadrants GU:  no suprapubic tenderness Eyes: Normal lids, no discharge, sclera anicteric ENMT: Moist mucous membranes Musculoskeletal: Ambulatory with rolling walker, no warmth, redness or swelling to left knee Skin: no rash to visible skin, warm without cyanosis Psych: non-anxious affect Neurological: Weakness but otherwise non focal Heme/lymph/immuno: no bruises, no bleeding  Thank you for the opportunity to participate in the care of AMARIANNA ABPLANALP Please call our office at [phone] if we can be of additional assistance.  Note: Portions of this note were generated with Scientist, clinical (histocompatibility and immunogenetics). Dictation errors may occur despite best attempts at proofreading.  Rosaura Carpenter, NP

## 2021-02-10 DIAGNOSIS — E1142 Type 2 diabetes mellitus with diabetic polyneuropathy: Secondary | ICD-10-CM | POA: Diagnosis not present

## 2021-02-10 DIAGNOSIS — M810 Age-related osteoporosis without current pathological fracture: Secondary | ICD-10-CM | POA: Diagnosis not present

## 2021-02-10 DIAGNOSIS — J45909 Unspecified asthma, uncomplicated: Secondary | ICD-10-CM | POA: Diagnosis not present

## 2021-02-10 DIAGNOSIS — N183 Chronic kidney disease, stage 3 unspecified: Secondary | ICD-10-CM | POA: Diagnosis not present

## 2021-02-10 DIAGNOSIS — I5022 Chronic systolic (congestive) heart failure: Secondary | ICD-10-CM | POA: Diagnosis not present

## 2021-02-10 DIAGNOSIS — E1122 Type 2 diabetes mellitus with diabetic chronic kidney disease: Secondary | ICD-10-CM | POA: Diagnosis not present

## 2021-02-10 DIAGNOSIS — R296 Repeated falls: Secondary | ICD-10-CM | POA: Diagnosis not present

## 2021-02-12 DIAGNOSIS — Z1159 Encounter for screening for other viral diseases: Secondary | ICD-10-CM | POA: Diagnosis not present

## 2021-02-12 DIAGNOSIS — Z20828 Contact with and (suspected) exposure to other viral communicable diseases: Secondary | ICD-10-CM | POA: Diagnosis not present

## 2021-02-15 DIAGNOSIS — Z1159 Encounter for screening for other viral diseases: Secondary | ICD-10-CM | POA: Diagnosis not present

## 2021-02-15 DIAGNOSIS — Z20828 Contact with and (suspected) exposure to other viral communicable diseases: Secondary | ICD-10-CM | POA: Diagnosis not present

## 2021-02-16 DIAGNOSIS — Z20828 Contact with and (suspected) exposure to other viral communicable diseases: Secondary | ICD-10-CM | POA: Diagnosis not present

## 2021-02-16 DIAGNOSIS — Z1159 Encounter for screening for other viral diseases: Secondary | ICD-10-CM | POA: Diagnosis not present

## 2021-02-19 DIAGNOSIS — Z20828 Contact with and (suspected) exposure to other viral communicable diseases: Secondary | ICD-10-CM | POA: Diagnosis not present

## 2021-02-19 DIAGNOSIS — Z1159 Encounter for screening for other viral diseases: Secondary | ICD-10-CM | POA: Diagnosis not present

## 2021-02-22 DIAGNOSIS — Z20828 Contact with and (suspected) exposure to other viral communicable diseases: Secondary | ICD-10-CM | POA: Diagnosis not present

## 2021-02-22 DIAGNOSIS — Z1159 Encounter for screening for other viral diseases: Secondary | ICD-10-CM | POA: Diagnosis not present

## 2021-02-23 DIAGNOSIS — R350 Frequency of micturition: Secondary | ICD-10-CM | POA: Diagnosis not present

## 2021-02-26 DIAGNOSIS — Z20828 Contact with and (suspected) exposure to other viral communicable diseases: Secondary | ICD-10-CM | POA: Diagnosis not present

## 2021-02-26 DIAGNOSIS — R110 Nausea: Secondary | ICD-10-CM | POA: Diagnosis not present

## 2021-02-26 DIAGNOSIS — R109 Unspecified abdominal pain: Secondary | ICD-10-CM | POA: Diagnosis not present

## 2021-02-26 DIAGNOSIS — Z1159 Encounter for screening for other viral diseases: Secondary | ICD-10-CM | POA: Diagnosis not present

## 2021-03-01 DIAGNOSIS — Z20828 Contact with and (suspected) exposure to other viral communicable diseases: Secondary | ICD-10-CM | POA: Diagnosis not present

## 2021-03-01 DIAGNOSIS — Z1159 Encounter for screening for other viral diseases: Secondary | ICD-10-CM | POA: Diagnosis not present

## 2021-03-05 DIAGNOSIS — Z20828 Contact with and (suspected) exposure to other viral communicable diseases: Secondary | ICD-10-CM | POA: Diagnosis not present

## 2021-03-05 DIAGNOSIS — Z1159 Encounter for screening for other viral diseases: Secondary | ICD-10-CM | POA: Diagnosis not present

## 2021-03-09 DIAGNOSIS — I10 Essential (primary) hypertension: Secondary | ICD-10-CM | POA: Diagnosis not present

## 2021-03-09 DIAGNOSIS — M7989 Other specified soft tissue disorders: Secondary | ICD-10-CM | POA: Diagnosis not present

## 2021-03-09 DIAGNOSIS — N3945 Continuous leakage: Secondary | ICD-10-CM | POA: Diagnosis not present

## 2021-03-12 DIAGNOSIS — Z20828 Contact with and (suspected) exposure to other viral communicable diseases: Secondary | ICD-10-CM | POA: Diagnosis not present

## 2021-03-12 DIAGNOSIS — Z1159 Encounter for screening for other viral diseases: Secondary | ICD-10-CM | POA: Diagnosis not present

## 2021-03-13 DIAGNOSIS — E1122 Type 2 diabetes mellitus with diabetic chronic kidney disease: Secondary | ICD-10-CM | POA: Diagnosis not present

## 2021-03-13 DIAGNOSIS — M810 Age-related osteoporosis without current pathological fracture: Secondary | ICD-10-CM | POA: Diagnosis not present

## 2021-03-13 DIAGNOSIS — J45909 Unspecified asthma, uncomplicated: Secondary | ICD-10-CM | POA: Diagnosis not present

## 2021-03-13 DIAGNOSIS — I5022 Chronic systolic (congestive) heart failure: Secondary | ICD-10-CM | POA: Diagnosis not present

## 2021-03-13 DIAGNOSIS — E1142 Type 2 diabetes mellitus with diabetic polyneuropathy: Secondary | ICD-10-CM | POA: Diagnosis not present

## 2021-03-13 DIAGNOSIS — R296 Repeated falls: Secondary | ICD-10-CM | POA: Diagnosis not present

## 2021-03-13 DIAGNOSIS — N183 Chronic kidney disease, stage 3 unspecified: Secondary | ICD-10-CM | POA: Diagnosis not present

## 2021-03-15 DIAGNOSIS — Z20828 Contact with and (suspected) exposure to other viral communicable diseases: Secondary | ICD-10-CM | POA: Diagnosis not present

## 2021-03-15 DIAGNOSIS — Z1159 Encounter for screening for other viral diseases: Secondary | ICD-10-CM | POA: Diagnosis not present

## 2021-03-19 DIAGNOSIS — Z1159 Encounter for screening for other viral diseases: Secondary | ICD-10-CM | POA: Diagnosis not present

## 2021-03-19 DIAGNOSIS — Z20828 Contact with and (suspected) exposure to other viral communicable diseases: Secondary | ICD-10-CM | POA: Diagnosis not present

## 2021-03-26 DIAGNOSIS — Z1159 Encounter for screening for other viral diseases: Secondary | ICD-10-CM | POA: Diagnosis not present

## 2021-03-26 DIAGNOSIS — Z20828 Contact with and (suspected) exposure to other viral communicable diseases: Secondary | ICD-10-CM | POA: Diagnosis not present

## 2021-03-27 DIAGNOSIS — K5900 Constipation, unspecified: Secondary | ICD-10-CM | POA: Diagnosis not present

## 2021-03-27 DIAGNOSIS — I480 Paroxysmal atrial fibrillation: Secondary | ICD-10-CM | POA: Diagnosis not present

## 2021-03-27 DIAGNOSIS — N183 Chronic kidney disease, stage 3 unspecified: Secondary | ICD-10-CM | POA: Diagnosis not present

## 2021-03-27 DIAGNOSIS — G4700 Insomnia, unspecified: Secondary | ICD-10-CM | POA: Diagnosis not present

## 2021-03-27 DIAGNOSIS — F411 Generalized anxiety disorder: Secondary | ICD-10-CM | POA: Diagnosis not present

## 2021-03-27 DIAGNOSIS — E039 Hypothyroidism, unspecified: Secondary | ICD-10-CM | POA: Diagnosis not present

## 2021-03-27 DIAGNOSIS — I5022 Chronic systolic (congestive) heart failure: Secondary | ICD-10-CM | POA: Diagnosis not present

## 2021-03-27 DIAGNOSIS — I429 Cardiomyopathy, unspecified: Secondary | ICD-10-CM | POA: Diagnosis not present

## 2021-03-27 DIAGNOSIS — E1142 Type 2 diabetes mellitus with diabetic polyneuropathy: Secondary | ICD-10-CM | POA: Diagnosis not present

## 2021-03-27 DIAGNOSIS — I130 Hypertensive heart and chronic kidney disease with heart failure and stage 1 through stage 4 chronic kidney disease, or unspecified chronic kidney disease: Secondary | ICD-10-CM | POA: Diagnosis not present

## 2021-03-27 DIAGNOSIS — F322 Major depressive disorder, single episode, severe without psychotic features: Secondary | ICD-10-CM | POA: Diagnosis not present

## 2021-03-27 DIAGNOSIS — Z0000 Encounter for general adult medical examination without abnormal findings: Secondary | ICD-10-CM | POA: Diagnosis not present

## 2021-03-27 DIAGNOSIS — E782 Mixed hyperlipidemia: Secondary | ICD-10-CM | POA: Diagnosis not present

## 2021-03-29 DIAGNOSIS — Z1159 Encounter for screening for other viral diseases: Secondary | ICD-10-CM | POA: Diagnosis not present

## 2021-03-29 DIAGNOSIS — Z20828 Contact with and (suspected) exposure to other viral communicable diseases: Secondary | ICD-10-CM | POA: Diagnosis not present

## 2021-04-02 DIAGNOSIS — Z20828 Contact with and (suspected) exposure to other viral communicable diseases: Secondary | ICD-10-CM | POA: Diagnosis not present

## 2021-04-02 DIAGNOSIS — Z1159 Encounter for screening for other viral diseases: Secondary | ICD-10-CM | POA: Diagnosis not present

## 2021-04-05 DIAGNOSIS — Z20828 Contact with and (suspected) exposure to other viral communicable diseases: Secondary | ICD-10-CM | POA: Diagnosis not present

## 2021-04-05 DIAGNOSIS — Z1159 Encounter for screening for other viral diseases: Secondary | ICD-10-CM | POA: Diagnosis not present

## 2021-04-06 ENCOUNTER — Emergency Department (HOSPITAL_COMMUNITY): Payer: Medicare HMO

## 2021-04-06 ENCOUNTER — Encounter (HOSPITAL_COMMUNITY): Payer: Self-pay

## 2021-04-06 ENCOUNTER — Inpatient Hospital Stay (HOSPITAL_COMMUNITY): Payer: Medicare HMO

## 2021-04-06 ENCOUNTER — Other Ambulatory Visit: Payer: Self-pay

## 2021-04-06 ENCOUNTER — Inpatient Hospital Stay (HOSPITAL_COMMUNITY)
Admission: EM | Admit: 2021-04-06 | Discharge: 2021-04-11 | DRG: 291 | Disposition: A | Discharge status: Skilled Nursing Facility | Payer: Medicare HMO | Attending: Family Medicine | Admitting: Family Medicine

## 2021-04-06 DIAGNOSIS — K5730 Diverticulosis of large intestine without perforation or abscess without bleeding: Secondary | ICD-10-CM | POA: Diagnosis present

## 2021-04-06 DIAGNOSIS — Z66 Do not resuscitate: Secondary | ICD-10-CM | POA: Diagnosis not present

## 2021-04-06 DIAGNOSIS — R1012 Left upper quadrant pain: Secondary | ICD-10-CM | POA: Diagnosis not present

## 2021-04-06 DIAGNOSIS — Z8249 Family history of ischemic heart disease and other diseases of the circulatory system: Secondary | ICD-10-CM | POA: Diagnosis not present

## 2021-04-06 DIAGNOSIS — Z8261 Family history of arthritis: Secondary | ICD-10-CM

## 2021-04-06 DIAGNOSIS — D500 Iron deficiency anemia secondary to blood loss (chronic): Secondary | ICD-10-CM | POA: Diagnosis present

## 2021-04-06 DIAGNOSIS — I4891 Unspecified atrial fibrillation: Secondary | ICD-10-CM | POA: Diagnosis present

## 2021-04-06 DIAGNOSIS — Z880 Allergy status to penicillin: Secondary | ICD-10-CM

## 2021-04-06 DIAGNOSIS — Z20822 Contact with and (suspected) exposure to covid-19: Secondary | ICD-10-CM | POA: Diagnosis present

## 2021-04-06 DIAGNOSIS — E1142 Type 2 diabetes mellitus with diabetic polyneuropathy: Secondary | ICD-10-CM | POA: Diagnosis present

## 2021-04-06 DIAGNOSIS — E039 Hypothyroidism, unspecified: Secondary | ICD-10-CM

## 2021-04-06 DIAGNOSIS — I130 Hypertensive heart and chronic kidney disease with heart failure and stage 1 through stage 4 chronic kidney disease, or unspecified chronic kidney disease: Secondary | ICD-10-CM | POA: Diagnosis not present

## 2021-04-06 DIAGNOSIS — M47816 Spondylosis without myelopathy or radiculopathy, lumbar region: Secondary | ICD-10-CM | POA: Diagnosis not present

## 2021-04-06 DIAGNOSIS — D509 Iron deficiency anemia, unspecified: Secondary | ICD-10-CM

## 2021-04-06 DIAGNOSIS — R531 Weakness: Secondary | ICD-10-CM | POA: Diagnosis not present

## 2021-04-06 DIAGNOSIS — F32A Depression, unspecified: Secondary | ICD-10-CM | POA: Diagnosis present

## 2021-04-06 DIAGNOSIS — F05 Delirium due to known physiological condition: Secondary | ICD-10-CM | POA: Diagnosis present

## 2021-04-06 DIAGNOSIS — R062 Wheezing: Secondary | ICD-10-CM | POA: Diagnosis not present

## 2021-04-06 DIAGNOSIS — R0902 Hypoxemia: Secondary | ICD-10-CM | POA: Diagnosis not present

## 2021-04-06 DIAGNOSIS — Z7982 Long term (current) use of aspirin: Secondary | ICD-10-CM | POA: Diagnosis not present

## 2021-04-06 DIAGNOSIS — N3289 Other specified disorders of bladder: Secondary | ICD-10-CM | POA: Diagnosis not present

## 2021-04-06 DIAGNOSIS — I255 Ischemic cardiomyopathy: Secondary | ICD-10-CM | POA: Diagnosis not present

## 2021-04-06 DIAGNOSIS — E785 Hyperlipidemia, unspecified: Secondary | ICD-10-CM | POA: Diagnosis present

## 2021-04-06 DIAGNOSIS — Z9181 History of falling: Secondary | ICD-10-CM

## 2021-04-06 DIAGNOSIS — D649 Anemia, unspecified: Secondary | ICD-10-CM | POA: Diagnosis not present

## 2021-04-06 DIAGNOSIS — Z79890 Hormone replacement therapy (postmenopausal): Secondary | ICD-10-CM | POA: Diagnosis not present

## 2021-04-06 DIAGNOSIS — R109 Unspecified abdominal pain: Secondary | ICD-10-CM | POA: Diagnosis not present

## 2021-04-06 DIAGNOSIS — Z79899 Other long term (current) drug therapy: Secondary | ICD-10-CM

## 2021-04-06 DIAGNOSIS — K862 Cyst of pancreas: Secondary | ICD-10-CM | POA: Diagnosis not present

## 2021-04-06 DIAGNOSIS — I5032 Chronic diastolic (congestive) heart failure: Secondary | ICD-10-CM | POA: Diagnosis not present

## 2021-04-06 DIAGNOSIS — J440 Chronic obstructive pulmonary disease with acute lower respiratory infection: Secondary | ICD-10-CM | POA: Diagnosis not present

## 2021-04-06 DIAGNOSIS — N183 Chronic kidney disease, stage 3 unspecified: Secondary | ICD-10-CM | POA: Diagnosis not present

## 2021-04-06 DIAGNOSIS — Z96642 Presence of left artificial hip joint: Secondary | ICD-10-CM | POA: Diagnosis present

## 2021-04-06 DIAGNOSIS — R059 Cough, unspecified: Secondary | ICD-10-CM | POA: Diagnosis not present

## 2021-04-06 DIAGNOSIS — I420 Dilated cardiomyopathy: Secondary | ICD-10-CM | POA: Diagnosis present

## 2021-04-06 DIAGNOSIS — I509 Heart failure, unspecified: Secondary | ICD-10-CM

## 2021-04-06 DIAGNOSIS — E1122 Type 2 diabetes mellitus with diabetic chronic kidney disease: Secondary | ICD-10-CM | POA: Diagnosis present

## 2021-04-06 DIAGNOSIS — I5031 Acute diastolic (congestive) heart failure: Secondary | ICD-10-CM

## 2021-04-06 DIAGNOSIS — Z806 Family history of leukemia: Secondary | ICD-10-CM | POA: Diagnosis not present

## 2021-04-06 DIAGNOSIS — I447 Left bundle-branch block, unspecified: Secondary | ICD-10-CM | POA: Diagnosis not present

## 2021-04-06 DIAGNOSIS — E876 Hypokalemia: Secondary | ICD-10-CM | POA: Diagnosis not present

## 2021-04-06 DIAGNOSIS — I110 Hypertensive heart disease with heart failure: Secondary | ICD-10-CM | POA: Diagnosis not present

## 2021-04-06 DIAGNOSIS — K5909 Other constipation: Secondary | ICD-10-CM | POA: Diagnosis present

## 2021-04-06 DIAGNOSIS — Z888 Allergy status to other drugs, medicaments and biological substances status: Secondary | ICD-10-CM

## 2021-04-06 DIAGNOSIS — F419 Anxiety disorder, unspecified: Secondary | ICD-10-CM | POA: Diagnosis present

## 2021-04-06 DIAGNOSIS — N1831 Chronic kidney disease, stage 3a: Secondary | ICD-10-CM | POA: Diagnosis present

## 2021-04-06 DIAGNOSIS — Z515 Encounter for palliative care: Secondary | ICD-10-CM

## 2021-04-06 DIAGNOSIS — J90 Pleural effusion, not elsewhere classified: Secondary | ICD-10-CM | POA: Diagnosis not present

## 2021-04-06 DIAGNOSIS — J189 Pneumonia, unspecified organism: Secondary | ICD-10-CM | POA: Diagnosis present

## 2021-04-06 DIAGNOSIS — I480 Paroxysmal atrial fibrillation: Secondary | ICD-10-CM | POA: Diagnosis not present

## 2021-04-06 DIAGNOSIS — K219 Gastro-esophageal reflux disease without esophagitis: Secondary | ICD-10-CM | POA: Diagnosis present

## 2021-04-06 DIAGNOSIS — Z882 Allergy status to sulfonamides status: Secondary | ICD-10-CM

## 2021-04-06 DIAGNOSIS — R110 Nausea: Secondary | ICD-10-CM | POA: Diagnosis not present

## 2021-04-06 DIAGNOSIS — J9811 Atelectasis: Secondary | ICD-10-CM | POA: Diagnosis not present

## 2021-04-06 DIAGNOSIS — N281 Cyst of kidney, acquired: Secondary | ICD-10-CM | POA: Diagnosis not present

## 2021-04-06 DIAGNOSIS — Z7189 Other specified counseling: Secondary | ICD-10-CM | POA: Diagnosis not present

## 2021-04-06 DIAGNOSIS — K5900 Constipation, unspecified: Secondary | ICD-10-CM | POA: Diagnosis not present

## 2021-04-06 DIAGNOSIS — Z887 Allergy status to serum and vaccine status: Secondary | ICD-10-CM

## 2021-04-06 DIAGNOSIS — R5381 Other malaise: Secondary | ICD-10-CM | POA: Diagnosis not present

## 2021-04-06 DIAGNOSIS — F0390 Unspecified dementia without behavioral disturbance: Secondary | ICD-10-CM | POA: Diagnosis not present

## 2021-04-06 DIAGNOSIS — T17908A Unspecified foreign body in respiratory tract, part unspecified causing other injury, initial encounter: Secondary | ICD-10-CM

## 2021-04-06 DIAGNOSIS — R0600 Dyspnea, unspecified: Secondary | ICD-10-CM | POA: Diagnosis not present

## 2021-04-06 DIAGNOSIS — I10 Essential (primary) hypertension: Secondary | ICD-10-CM | POA: Diagnosis not present

## 2021-04-06 DIAGNOSIS — J45901 Unspecified asthma with (acute) exacerbation: Secondary | ICD-10-CM | POA: Diagnosis not present

## 2021-04-06 DIAGNOSIS — I5043 Acute on chronic combined systolic (congestive) and diastolic (congestive) heart failure: Secondary | ICD-10-CM | POA: Diagnosis not present

## 2021-04-06 DIAGNOSIS — R0602 Shortness of breath: Secondary | ICD-10-CM | POA: Diagnosis not present

## 2021-04-06 DIAGNOSIS — Z9049 Acquired absence of other specified parts of digestive tract: Secondary | ICD-10-CM

## 2021-04-06 LAB — CBC
HCT: 29.3 % — ABNORMAL LOW (ref 36.0–46.0)
Hemoglobin: 8.8 g/dL — ABNORMAL LOW (ref 12.0–15.0)
MCH: 23.2 pg — ABNORMAL LOW (ref 26.0–34.0)
MCHC: 30 g/dL (ref 30.0–36.0)
MCV: 77.3 fL — ABNORMAL LOW (ref 80.0–100.0)
Platelets: 160 10*3/uL (ref 150–400)
RBC: 3.79 MIL/uL — ABNORMAL LOW (ref 3.87–5.11)
RDW: 16.5 % — ABNORMAL HIGH (ref 11.5–15.5)
WBC: 7.8 10*3/uL (ref 4.0–10.5)
nRBC: 0 % (ref 0.0–0.2)

## 2021-04-06 LAB — BASIC METABOLIC PANEL
Anion gap: 7 (ref 5–15)
BUN: 13 mg/dL (ref 8–23)
CO2: 24 mmol/L (ref 22–32)
Calcium: 9.1 mg/dL (ref 8.9–10.3)
Chloride: 108 mmol/L (ref 98–111)
Creatinine, Ser: 0.96 mg/dL (ref 0.44–1.00)
GFR, Estimated: 54 mL/min — ABNORMAL LOW (ref >60)
Glucose, Bld: 148 mg/dL — ABNORMAL HIGH (ref 70–99)
Potassium: 4.3 mmol/L (ref 3.5–5.1)
Sodium: 139 mmol/L (ref 135–145)

## 2021-04-06 LAB — TSH: TSH: 1.912 u[IU]/mL (ref 0.350–4.500)

## 2021-04-06 LAB — RESP PANEL BY RT-PCR (FLU A&B, COVID) ARPGX2
Influenza A by PCR: NEGATIVE
Influenza B by PCR: NEGATIVE
SARS Coronavirus 2 by RT PCR: NEGATIVE

## 2021-04-06 LAB — IRON AND TIBC
Iron: 24 ug/dL — ABNORMAL LOW (ref 28–170)
Saturation Ratios: 6 % — ABNORMAL LOW (ref 10.4–31.8)
TIBC: 377 ug/dL (ref 250–450)
UIBC: 353 ug/dL

## 2021-04-06 LAB — RETICULOCYTES
Immature Retic Fract: 19.1 % — ABNORMAL HIGH (ref 2.3–15.9)
RBC.: 4.12 MIL/uL (ref 3.87–5.11)
Retic Count, Absolute: 49.4 10*3/uL (ref 19.0–186.0)
Retic Ct Pct: 1.2 % (ref 0.4–3.1)

## 2021-04-06 LAB — ECHOCARDIOGRAM COMPLETE
Height: 70 in
S' Lateral: 2.5 cm
Single Plane A4C EF: 30.8 %
Weight: 3520.31 oz

## 2021-04-06 LAB — PREPARE RBC (CROSSMATCH)

## 2021-04-06 LAB — TROPONIN I (HIGH SENSITIVITY)
Troponin I (High Sensitivity): 11 ng/L (ref <18)
Troponin I (High Sensitivity): 14 ng/L (ref <18)

## 2021-04-06 LAB — LIPASE, BLOOD: Lipase: 21 U/L (ref 11–51)

## 2021-04-06 LAB — FERRITIN: Ferritin: 24 ng/mL (ref 11–307)

## 2021-04-06 MED ORDER — ENOXAPARIN SODIUM 30 MG/0.3ML IJ SOSY: 30.0000 mg | PREFILLED_SYRINGE | INTRAMUSCULAR | Status: DC

## 2021-04-06 MED ORDER — CARVEDILOL 3.125 MG PO TABS
6.2500 mg | ORAL_TABLET | Freq: Two times a day (BID) | ORAL | Status: DC
  Administered 2021-04-06 – 2021-04-11 (×9): 6.25 mg via ORAL
  Filled 2021-04-06 (×10): qty 2

## 2021-04-06 MED ORDER — SODIUM CHLORIDE 0.9 % IV SOLN
250.0000 mL | INTRAVENOUS | Status: DC | PRN
  Administered 2021-04-06: 250 mL via INTRAVENOUS

## 2021-04-06 MED ORDER — ASPIRIN 81 MG PO CHEW
81.0000 mg | CHEWABLE_TABLET | Freq: Every day | ORAL | Status: DC
  Filled 2021-04-06: qty 1

## 2021-04-06 MED ORDER — DIAZEPAM 5 MG PO TABS: 2.5000 mg | ORAL_TABLET | ORAL | Status: DC

## 2021-04-06 MED ORDER — ALPRAZOLAM 0.5 MG PO TABS
1.0000 mg | ORAL_TABLET | Freq: Three times a day (TID) | ORAL | Status: DC | PRN
  Administered 2021-04-08 – 2021-04-10 (×4): 1 mg via ORAL
  Filled 2021-04-06 (×4): qty 2

## 2021-04-06 MED ORDER — PANTOPRAZOLE SODIUM 40 MG PO TBEC
40.0000 mg | DELAYED_RELEASE_TABLET | Freq: Two times a day (BID) | ORAL | Status: DC
  Filled 2021-04-06: qty 1

## 2021-04-06 MED ORDER — FERROUS SULFATE 325 (65 FE) MG PO TABS
325.0000 mg | ORAL_TABLET | Freq: Every day | ORAL | Status: DC
  Administered 2021-04-08 – 2021-04-11 (×4): 325 mg via ORAL
  Filled 2021-04-06 (×5): qty 1

## 2021-04-06 MED ORDER — SPIRONOLACTONE 12.5 MG HALF TABLET
12.5000 mg | ORAL_TABLET | Freq: Every day | ORAL | Status: DC
  Administered 2021-04-08 – 2021-04-11 (×4): 12.5 mg via ORAL
  Filled 2021-04-06 (×6): qty 1

## 2021-04-06 MED ORDER — ALBUTEROL SULFATE HFA 108 (90 BASE) MCG/ACT IN AERS
2.0000 | INHALATION_SPRAY | Freq: Once | RESPIRATORY_TRACT | Status: AC
  Administered 2021-04-06: 2 via RESPIRATORY_TRACT
  Filled 2021-04-06: qty 6.7

## 2021-04-06 MED ORDER — IPRATROPIUM-ALBUTEROL 0.5-2.5 (3) MG/3ML IN SOLN
3.0000 mL | Freq: Four times a day (QID) | RESPIRATORY_TRACT | Status: DC
  Administered 2021-04-06 – 2021-04-07 (×4): 3 mL via RESPIRATORY_TRACT
  Filled 2021-04-06 (×6): qty 3

## 2021-04-06 MED ORDER — FUROSEMIDE 10 MG/ML IJ SOLN
40.0000 mg | Freq: Two times a day (BID) | INTRAMUSCULAR | Status: DC
  Administered 2021-04-06 – 2021-04-07 (×2): 40 mg via INTRAVENOUS
  Filled 2021-04-06 (×2): qty 4

## 2021-04-06 MED ORDER — FUROSEMIDE 10 MG/ML IJ SOLN
20.0000 mg | Freq: Every day | INTRAMUSCULAR | Status: DC
  Administered 2021-04-06: 20 mg via INTRAVENOUS
  Filled 2021-04-06: qty 2

## 2021-04-06 MED ORDER — PANTOPRAZOLE SODIUM 40 MG PO TBEC: 40.0000 mg | DELAYED_RELEASE_TABLET | Freq: Every day | ORAL | Status: DC

## 2021-04-06 MED ORDER — LEVOTHYROXINE SODIUM 25 MCG PO TABS
25.0000 ug | ORAL_TABLET | Freq: Every day | ORAL | Status: DC
  Administered 2021-04-07 – 2021-04-11 (×5): 25 ug via ORAL
  Filled 2021-04-06 (×5): qty 1

## 2021-04-06 MED ORDER — HYDRALAZINE HCL 20 MG/ML IJ SOLN: 5.0000 mg | Freq: Four times a day (QID) | INTRAMUSCULAR | Status: DC | PRN

## 2021-04-06 MED ORDER — ONDANSETRON HCL 4 MG/2ML IJ SOLN
4.0000 mg | Freq: Four times a day (QID) | INTRAMUSCULAR | Status: DC | PRN
  Administered 2021-04-07: 4 mg via INTRAVENOUS
  Filled 2021-04-06: qty 2

## 2021-04-06 MED ORDER — SODIUM CHLORIDE 0.9% FLUSH: 3.0000 mL | INTRAVENOUS | Status: DC | PRN

## 2021-04-06 MED ORDER — SODIUM CHLORIDE 0.9 % IV SOLN
125.0000 mg | Freq: Once | INTRAVENOUS | Status: AC
  Administered 2021-04-07: 125 mg via INTRAVENOUS
  Filled 2021-04-06 (×2): qty 10

## 2021-04-06 MED ORDER — PREDNISONE 20 MG PO TABS
20.0000 mg | ORAL_TABLET | Freq: Every day | ORAL | Status: DC
  Administered 2021-04-08 – 2021-04-10 (×3): 20 mg via ORAL
  Filled 2021-04-06 (×4): qty 1

## 2021-04-06 MED ORDER — AZITHROMYCIN 250 MG PO TABS
500.0000 mg | ORAL_TABLET | Freq: Once | ORAL | Status: AC
  Administered 2021-04-06: 500 mg via ORAL
  Filled 2021-04-06: qty 2

## 2021-04-06 MED ORDER — LACTINEX PO CHEW
1.0000 | CHEWABLE_TABLET | Freq: Every day | ORAL | Status: DC
  Administered 2021-04-08 – 2021-04-10 (×3): 1 via ORAL
  Filled 2021-04-06 (×8): qty 1

## 2021-04-06 MED ORDER — MIRTAZAPINE 15 MG PO TABS
30.0000 mg | ORAL_TABLET | Freq: Every day | ORAL | Status: DC
  Administered 2021-04-06 – 2021-04-10 (×5): 30 mg via ORAL
  Filled 2021-04-06: qty 1
  Filled 2021-04-06 (×5): qty 2

## 2021-04-06 MED ORDER — SODIUM CHLORIDE 0.9% FLUSH
3.0000 mL | Freq: Two times a day (BID) | INTRAVENOUS | Status: DC
  Administered 2021-04-06 – 2021-04-10 (×7): 3 mL via INTRAVENOUS

## 2021-04-06 MED ORDER — BENZONATATE 100 MG PO CAPS
100.0000 mg | ORAL_CAPSULE | Freq: Three times a day (TID) | ORAL | Status: DC | PRN
  Filled 2021-04-06: qty 1

## 2021-04-06 MED ORDER — ACETAMINOPHEN 325 MG PO TABS
650.0000 mg | ORAL_TABLET | ORAL | Status: DC | PRN
  Administered 2021-04-10: 650 mg via ORAL
  Filled 2021-04-06: qty 2

## 2021-04-06 MED ORDER — DOXYCYCLINE HYCLATE 100 MG PO TABS
100.0000 mg | ORAL_TABLET | Freq: Two times a day (BID) | ORAL | Status: DC
  Administered 2021-04-06: 100 mg via ORAL
  Filled 2021-04-06: qty 1

## 2021-04-06 MED ORDER — LOSARTAN POTASSIUM 50 MG PO TABS
25.0000 mg | ORAL_TABLET | Freq: Every day | ORAL | Status: DC
  Administered 2021-04-06 – 2021-04-11 (×5): 25 mg via ORAL
  Filled 2021-04-06 (×6): qty 1

## 2021-04-06 MED ORDER — IPRATROPIUM-ALBUTEROL 0.5-2.5 (3) MG/3ML IN SOLN: 3.0000 mL | Freq: Once | RESPIRATORY_TRACT | Status: DC

## 2021-04-06 MED ORDER — HYDRALAZINE HCL 25 MG PO TABS: 25.0000 mg | ORAL_TABLET | Freq: Three times a day (TID) | ORAL | Status: DC

## 2021-04-06 MED ORDER — CARVEDILOL 3.125 MG PO TABS: 6.2500 mg | ORAL_TABLET | Freq: Two times a day (BID) | ORAL | Status: DC

## 2021-04-06 MED ORDER — SODIUM CHLORIDE 0.9% IV SOLUTION: Freq: Once | INTRAVENOUS | Status: AC

## 2021-04-06 MED ORDER — GUAIFENESIN 100 MG/5ML PO SOLN
200.0000 mg | Freq: Three times a day (TID) | ORAL | Status: DC | PRN
  Filled 2021-04-06: qty 10

## 2021-04-06 MED ORDER — ACETAMINOPHEN 325 MG PO TABS: 325.0000 mg | ORAL_TABLET | Freq: Four times a day (QID) | ORAL | Status: DC | PRN

## 2021-04-06 MED ORDER — CARVEDILOL 12.5 MG PO TABS: 12.5000 mg | ORAL_TABLET | Freq: Two times a day (BID) | ORAL | Status: DC

## 2021-04-06 MED ORDER — MAGNESIUM HYDROXIDE 400 MG/5ML PO SUSP
15.0000 mL | Freq: Every day | ORAL | Status: DC | PRN
  Administered 2021-04-10: 15 mL via ORAL
  Filled 2021-04-06 (×2): qty 30

## 2021-04-06 MED ORDER — METHYLPREDNISOLONE SODIUM SUCC 125 MG IJ SOLR
125.0000 mg | Freq: Once | INTRAMUSCULAR | Status: AC
  Administered 2021-04-06: 125 mg via INTRAVENOUS
  Filled 2021-04-06: qty 2

## 2021-04-06 MED ORDER — SODIUM CHLORIDE 0.9 % IV SOLN
1.0000 g | Freq: Once | INTRAVENOUS | Status: AC
  Administered 2021-04-06: 1 g via INTRAVENOUS
  Filled 2021-04-06: qty 10

## 2021-04-06 MED ORDER — SERTRALINE HCL 100 MG PO TABS
100.0000 mg | ORAL_TABLET | Freq: Every day | ORAL | Status: DC
  Administered 2021-04-08 – 2021-04-11 (×4): 100 mg via ORAL
  Filled 2021-04-06 (×7): qty 1

## 2021-04-06 MED ORDER — SIMVASTATIN 20 MG PO TABS
40.0000 mg | ORAL_TABLET | Freq: Every day | ORAL | Status: DC
  Administered 2021-04-08 – 2021-04-11 (×4): 40 mg via ORAL
  Filled 2021-04-06 (×6): qty 2

## 2021-04-06 NOTE — Consult Note (Addendum)
Cardiology Consultation:   Patient ID: ERLINDA SOLINGER MRN: [id number]; DOB: 06-Nov-1923  Admit date: 04/06/2021 Date of Consult: 04/06/2021  PCP:  Mayra Neer, MD   [facility] Providers Cardiologist:  Jenkins Rouge, MD        Patient Profile:   ADELISA SATTERWHITE is a 87 y.o. female with a PMH of dilated cardiomyopathy with recovery of EF, chronic LBBB, paroxysmal atrial fibrillation not on anticoagulation due to fall risk, HTN, hypothyroidism, and pancreatic mass who is being seen 04/06/2021 for the evaluation of CHF at the request of Dr. Roosevelt Locks.  History of Present Illness:   Ms. Raybuck was last evaluated by cardiology at an outpatient visit with Dr. Johnsie Cancel in 2020, at which time she had several non-cardiac complaints (depression, UTI, poor BM's, and arthritis), though overall doing fine from a cardiac standpoint. She was living in independent living at [facility]. No medication changes occurred and further cardiac imaging deferred unless changes in symptoms. She confirmed wanting to be DNR at that visit. Her last echocardiogram in 2017 showed EF 60-65%, G1DD, no RMWA, mild LVH, and no significant valvular abnormalities. She had a reported EF of 20% back in 2009.   She presented to the ED 04/06/21 with complaints of worsening SOB. She has been having SOB for the past several months which are acutely worsened over the past couple days with symptoms occurring just with ambulation to the bathroom. She has had a productive cough with clear phlegm and wheezing for which she had been using an albuterol inhaler 2-3 times a day with relief.   Vitals with hypertension and intermittent tachypneia, placed on Bipap with O2 sats 100%, afebrile with normal heart rates. Labs notable for electrolytes wnl, Cr 0.96, Hgb 8.8 (last 13.2 in 2020), PLT 160, HsTrop 11>14, low iron, TSH wnl, COVID-19 and influenza negative. EKG showed sinus rhythm, rate 86 bpm, chronic LBBB. CXR c/f bronchitis vs  viral/atypical PNA. CT A/P showed small bilateral pleural effusions R<L and stable cystic lesions in the pancreas (felt to be benign given long-term stability). She was admitted to medicine and given IV lasix 10m Echo this admission showed EF 30-35%, wall motion is difficult to assess but likely apical/inferior akinesis, moderate asymmetric hypertrophy, and no significant valvular abnormalities. Her carvedilol was increased to 12.535mBID and she was started on hydralazine 2575m6h. Cardiology asked to evaluate patient given recurrent cardiomyopathy.  Past Medical History:  Diagnosis Date  . Atrial fibrillation ([facility] . Cholelithiasis   . Constipation   . Diabetes mellitus   . Dizziness    with some gait disability  . GERD (gastroesophageal reflux disease)   . Hepatic cyst   . Hyperlipidemia   . Hypertension   . Ischemic cardiomyopathy   . LBBB (left bundle branch block)   . Macular degeneration   . Other postprocedural status(V45.89)   . Peripheral edema     Past Surgical History:  Procedure Laterality Date  . ANKLE SURGERY Bilateral    fractures  . CHOLECYSTECTOMY    . Dilation and curettage    . HIP ARTHROPLASTY  05/05/2012   Procedure: ARTHROPLASTY BIPOLAR HIP;  Surgeon: MatMauri PoleD;  Location: [facility];  Service: Orthopedics;  Laterality: Left;  . TONSILLECTOMY    . WRIST SURGERY Bilateral    wrist fractures     Home Medications:  Prior to Admission medications   Medication Sig Start Date End Date Taking? Authorizing Provider  acetaminophen (TYLENOL) 500 MG tablet Take 500 mg  by mouth every evening.   Yes [provider]  ALPRAZolam Duanne Moron) 1 MG tablet Take 1 mg by mouth 3 (three) times daily as needed for anxiety.   Yes [provider]  aspirin 81 MG chewable tablet Chew 81 mg by mouth daily.    Yes [provider]  benzonatate (TESSALON) 100 MG capsule Take 100 mg by mouth 3 (three) times daily as needed for cough.   Yes [provider]  carvedilol (COREG) 6.25 MG tablet Take 6.25 mg by mouth 2 (two) times daily. 09/03/17  Yes [provider]  guaiFENesin (MUCINEX) 600 MG 12 hr tablet Take 600 mg by mouth 2 (two) times daily.   Yes [provider]  guaifenesin (ROBITUSSIN) 100 MG/5ML syrup Take 100 mg by mouth every 8 (eight) hours as needed for cough.   Yes [provider]  hydrOXYzine (ATARAX/VISTARIL) 10 MG tablet Take 10 mg by mouth 3 (three) times daily as needed for itching.   Yes [provider]  lactobacillus acidophilus & bulgar (LACTINEX) chewable tablet Chew 1 tablet by mouth daily.   Yes [provider]  levothyroxine (SYNTHROID, LEVOTHROID) 25 MCG tablet Take 25 mcg by mouth daily before breakfast.   Yes [provider]  loperamide (IMODIUM A-D) 2 MG tablet Take 4 mg by mouth See admin instructions. Take 2 tablets (4 mg) by mouth as needed after 1st loose stool followed by 1 tablet (2 mg) after each subsequent loose stool.   Yes [provider]  magnesium hydroxide (MILK OF MAGNESIA) 400 MG/5ML suspension Take 15 mLs by mouth daily as needed (constipation).    Yes [provider]  mirabegron ER (MYRBETRIQ) 25 MG TB24 tablet Take 25 mg by mouth daily.   Yes [provider]  mirtazapine (REMERON) 30 MG tablet Take 30 mg by mouth at bedtime.    Yes [provider]  Multiple Vitamins-Minerals (PRESERVISION AREDS PO) Take 1 tablet by mouth daily.    Yes [provider]  omeprazole (PRILOSEC) 20 MG capsule Take 20 mg by mouth daily.   Yes [provider]  ondansetron (ZOFRAN) 4 MG tablet Take 4 mg by mouth daily as needed for nausea.  06/09/17  Yes [provider]  polyethylene glycol (MIRALAX / GLYCOLAX) 17 g packet Take 17 g by mouth daily.   Yes [provider]  sertraline (ZOLOFT) 100 MG tablet Take 100 mg by mouth daily. 08/25/16  Yes [provider]  simvastatin (ZOCOR) 40  MG tablet Take 40 mg by mouth daily.   Yes [provider]  torsemide (DEMADEX) 20 MG tablet Take 20 mg by mouth daily as needed (leg swelling).   Yes [provider]  triamcinolone cream (KENALOG) 0.1 % 1 application 2 (two) times daily as needed (itching). perineum   Yes [provider]  Zinc Oxide (DESITIN) 13 % CREA Apply 1 application topically See admin instructions. Cleanse groin area under breast with soap and water, pat dry and apply ointment on area as needed for redness.   Yes [provider]  colestipol (COLESTID) 1 g tablet Take 2 tablets (2 g total) by mouth daily. Hold FOR CONSTIPATION Patient not taking: No sig reported 12/11/17   Kayleen Memos, DO  spironolactone (ALDACTONE) 25 MG tablet Take 0.5 tablets (12.5 mg total) by mouth daily. Patient not taking: Reported on 04/06/2021 02/21/17   Josue Hector, MD    Inpatient Medications: Scheduled Meds: . sodium chloride   Intravenous Once  . [  START ON 04/07/2021] aspirin  81 mg Oral Daily  . carvedilol  6.25 mg Oral BID  . [START ON 04/07/2021] ferrous sulfate  325 mg Oral Q breakfast  . furosemide  40 mg Intravenous Q12H  . ipratropium-albuterol  3 mL Nebulization Once  . ipratropium-albuterol  3 mL Nebulization Q6H  . lactobacillus acidophilus & bulgar  1 tablet Oral Daily  . [START ON 04/07/2021] levothyroxine  25 mcg Oral QAC breakfast  . losartan  25 mg Oral Daily  . mirtazapine  30 mg Oral QHS  . [START ON 04/07/2021] pantoprazole  40 mg Oral BID AC  . [START ON 04/07/2021] predniSONE  20 mg Oral Q breakfast  . sertraline  100 mg Oral Daily  . [START ON 04/07/2021] simvastatin  40 mg Oral Daily  . sodium chloride flush  3 mL Intravenous Q12H  . [START ON 04/07/2021] spironolactone  12.5 mg Oral Daily   Continuous Infusions: . sodium chloride    . ferric gluconate (FERRLECIT/NULECIT) IV     PRN Meds: sodium chloride, acetaminophen, ALPRAZolam, benzonatate, guaiFENesin, hydrALAZINE,  magnesium hydroxide, ondansetron (ZOFRAN) IV, sodium chloride flush  Allergies:    Allergies  Allergen Reactions  . Fosamax [Alendronate Sodium] Swelling  . Penicillins Swelling and Other (See Comments)    Swelling, redness and warmth at injection site Has patient had a PCN reaction causing immediate rash, facial/tongue/throat swelling, SOB or lightheadedness with hypotension: No Has patient had a PCN reaction causing severe rash involving mucus membranes or skin necrosis: No Has patient had a PCN reaction that required hospitalization: No Has patient had a PCN reaction occurring within the last 10 years: No If all of the above answers are "NO", then may proceed with Cephalosporin use.  . Sulfonamide Derivatives Swelling  . Tetanus Toxoid Swelling    Social History:   Social History   Socioeconomic History  . Marital status: Widowed    Spouse name: Not on file  . Number of children: Not on file  . Years of education: Not on file  . Highest education level: Not on file  Occupational History  . Not on file  Tobacco Use  . Smoking status: Never Smoker  . Smokeless tobacco: Never Used  . Tobacco comment: occupational exposure:  lint exposure from [place] Use  . Vaping Use: Never used  Substance and Sexual Activity  . Alcohol use: No  . Drug use: No  . Sexual activity: Not on file  Other Topics Concern  . Not on file  Social History Narrative   Lives alone, uses a walker.  Does not drive             Social Determinants of Radio broadcast assistant Strain: Not on file  Food Insecurity: Not on file  Transportation Needs: Not on file  Physical Activity: Not on file  Stress: Not on file  Social Connections: Not on file  Intimate Partner Violence: Not on file    Family History:    Family History  Problem Relation Age of Onset  . Leukemia Mother   . Arthritis Father   . Heart failure Brother        died age 54  . Arthritis Brother      ROS:  Please  see the history of present illness.   All other ROS reviewed and negative.     Physical Exam/Data:   Vitals:   04/06/21 1730 04/06/21 1830 04/06/21 1947 04/06/21 2008  BP: (!) 155/90 (!) 148/76 Marland Kitchen)  144/71   Pulse: 90 89 93   Resp: 19 (!) 22 20   Temp:      TempSrc:      SpO2: 91% 94% 100% 100%  Weight:      Height:        Intake/Output Summary (Last 24 hours) at 04/06/2021 2059 Last data filed at 04/06/2021 1311 Gross per 24 hour  Intake 100 ml  Output --  Net 100 ml   Last 3 Weights 04/06/2021 07/02/2019 12/09/2018  Weight (lbs) 220 lb 0.3 oz 220 lb 218 lb 6.4 oz  Weight (kg) 99.8 kg 99.791 kg 99.066 kg     Body mass index is 31.57 kg/m.  Physical Exam per MD below  EKG:  The EKG was personally reviewed and demonstrates:  sinus rhythm, rate 86 bpm, chronic LBBB.   Relevant CV Studies: Left heart catheterization 2009: 1. The left main coronary artery bifurcates into the circumflex and      LAD.  The left main has no evidence of disease.  2. The left anterior descending is a large vessel that courses to the      apex and has only luminal irregularities noted in the proximal      portion.  The LAD gives off 2 large diagonal branches that are free      of disease.  3. The circumflex artery has no evidence of disease.  4. The right coronary artery is a large dominant vessel that has no      angiographic evidence of disease.   The left ventricular angiogram showed global hypokinesis with an  estimated ejection fraction of 15-20%.   IMPRESSION:  1. No significant coronary artery disease.  2. Severe left ventricular dysfunction consistent with a nonischemic      cardiomyopathy.  Echocardiogram 2017: - Left ventricle: The cavity size was normal. Wall thickness was  increased in a pattern of mild LVH. Systolic function was normal.  The estimated ejection fraction was in the range of 60% to 65%.  Wall motion was normal; there were no regional wall motion   abnormalities. Doppler parameters are consistent with abnormal  left ventricular relaxation (grade 1 diastolic dysfunction).  - Ventricular septum: Septal motion showed abnormal function and  dyssynergy.  - Aortic valve: Mildly to moderately calcified annulus. Trileaflet.  - Mitral valve: Calcified annulus. There was trivial regurgitation.  - Left atrium: The atrium was at the upper limits of normal in  size.  - Right atrium: Central venous pressure (est): 3 mm Hg.  - Tricuspid valve: There was trivial regurgitation.  - Pulmonary arteries: Systolic pressure could not be accurately  estimated.  - Pericardium, extracardiac: There was no pericardial effusion.   Impressions:   - Mild LVH with LVEF 60-65%. Grade 1 diastolic dysfunction with  intermediate LV filling pressure. There is septal dyssynergy  consistent with left bundle branch block. Upper normal left  atrial chamber size. MAC with trivial mitral regurgitation.  Sclerotic aortic valve without stenosis. Trivial tricuspid  regurgitation.   Echocardiogram 04/06/21: 1. Left ventricular ejection fraction, by estimation, is 30 to 35%. The  left ventricle has moderately decreased function. Wall motion difficult to  assess due to lack of definity contrast and incomplete visualization of  the LV endocardium. Based on  limited views, all mid-to-apical septal, apical inferior, mid-to-apcial  anterior, and apex appear akinetic. Findings concerning for possible LAD  disease. There is moderate asymmetric hypertrophy of the basal septum. The  rest of the LV segments  demonstrate  mild concentric left ventricular hypertrophy. Indeterminate  diastolic filling due to E-A fusion.  2. Right ventricular systolic function is normal. The right ventricular  size is normal.  3. The mitral valve is grossly normal. There is mild thickening of the  mitral valve leaflet(s). There is mild calcification of the mitral valve   leaflet(s). Mild to moderate mitral annular calcification. Trivial mitral  valve regurgitation. No evidence of  mitral stenosis.  4. The aortic valve was not well visualized. Aortic valve regurgitation  is not visualized.  5. There is flow acceleration noted at the level of the pulmonic valve  (poor visualization of the valve) with mean gradient 74mHg, peak gradient  267mg, Vmax 2.6339m Either mild PS or flow acceleration due to vigorous  RV systolic function.   Comparison(s): Compared to prior echo report, the LVEF is now moderately  reduced to 30-35% (previously 60-65%) with wall motion abnormalities as  detailed above.   Laboratory Data:  High Sensitivity Troponin:   Recent Labs  Lab 04/06/21 1000 04/06/21 1128  TROPONINIHS 11 14     Chemistry Recent Labs  Lab 04/06/21 1000  NA 139  K 4.3  CL 108  CO2 24  GLUCOSE 148*  BUN 13  CREATININE 0.96  CALCIUM 9.1  GFRNONAA 54*  ANIONGAP 7    No results for input(s): PROT, ALBUMIN, AST, ALT, ALKPHOS, BILITOT in the last 168 hours. Hematology Recent Labs  Lab 04/06/21 1000 04/06/21 1620  WBC 7.8  --   RBC 3.79* 4.12  HGB 8.8*  --   HCT 29.3*  --   MCV 77.3*  --   MCH 23.2*  --   MCHC 30.0  --   RDW 16.5*  --   PLT 160  --    BNPNo results for input(s): BNP, PROBNP in the last 168 hours.  DDimer No results for input(s): DDIMER in the last 168 hours.   Radiology/Studies:  CT ABDOMEN PELVIS WO CONTRAST  Result Date: 04/06/2021 CLINICAL DATA:  Abdominal pain EXAM: CT ABDOMEN AND PELVIS WITHOUT CONTRAST TECHNIQUE: Multidetector CT imaging of the abdomen and pelvis was performed following the standard protocol without IV contrast. COMPARISON:  01/28/2018 FINDINGS: Lower chest: Lung bases demonstrate mild pleural effusions bilaterally right slightly greater than left. Some mild associated atelectasis is noted. Hepatobiliary: No focal liver abnormality is seen. Status post cholecystectomy. No biliary dilatation.  Pancreas: Fatty interdigitation of the pancreas is noted. Previously seen cystic lesions are again identified and stable. Spleen: Normal in size without focal abnormality. Adrenals/Urinary Tract: Adrenal glands are unremarkable. Kidneys are well visualized bilaterally without renal calculi or obstructive changes. The ureters show no renal calculi. Multiple cysts are noted within the right kidney similar to that seen on the prior exam. The largest of these is noted anteriorly measuring approximately 5.3 cm. Bladder is well distended. Stomach/Bowel: Scattered diverticular change of the colon is noted. No evidence of diverticulitis is seen. The appendix is not well visualized. No inflammatory changes to suggest appendicitis are noted. Small bowel and stomach appear within normal limits. Vascular/Lymphatic: Atherosclerotic calcifications of the abdominal aorta are seen. No aneurysmal dilatation is noted. No sizable adenopathy is seen. Reproductive: Uterus and bilateral adnexa are unremarkable. Other: No abdominal wall hernia or abnormality. No abdominopelvic ascites. Musculoskeletal: Bilateral hip replacements are seen. Degenerative changes of lumbar spine are noted. No other focal abnormality is seen. IMPRESSION: Small bilateral pleural effusions right greater than left with associated atelectatic changes. Stable cystic lesions within the pancreas. Given their long-term  stability they are felt to be benign in etiology. Diverticulosis without diverticulitis. Electronically Signed   By: Inez Catalina M.D.   On: 04/06/2021 15:45   DG Chest Portable 1 View  Result Date: 04/06/2021 CLINICAL DATA:  Shortness of breath, cough EXAM: PORTABLE CHEST 1 VIEW COMPARISON:  10/19/2020 FINDINGS: Heart is normal size. Femoral heel thickening and increased interstitial markings particularly in the lung bases. No effusions. No acute bony abnormality. IMPRESSION: Peribronchial thickening and interstitial prominence in the lower lobes  could reflect bronchitis or viral/atypical pneumonia. Less likely edema given the normal heart size. Electronically Signed   By: Rolm Baptise M.D.   On: 04/06/2021 10:52   ECHOCARDIOGRAM COMPLETE  Result Date: 04/06/2021    ECHOCARDIOGRAM REPORT   Patient Name:   OMIE FERGER Date of Exam: 04/06/2021 Medical Rec #:  [id number]            Height:       70.0 in Accession #:    [id number]           Weight:       220.0 lb Date of Birth:  01-22-1923            BSA:          2.174 m Patient Age:    35 years             BP:           155/90 mmHg Patient Gender: F                    HR:           95 bpm. Exam Location:  Inpatient Procedure: 2D Echo, Cardiac Doppler and Color Doppler Indications:     Y86.57 Acute diastolic (congestive) heart failure  History:         Patient has prior history of Echocardiogram examinations, most                  recent 04/14/2016. 87 year-old female. nausea.  Sonographer:     Merrie Roof Referring Phys:  [id number] Lequita Halt Diagnosing Phys: Gwyndolyn Kaufman MD IMPRESSIONS  1. Left ventricular ejection fraction, by estimation, is 30 to 35%. The left ventricle has moderately decreased function. Wall motion difficult to assess due to lack of definity contrast and incomplete visualization of the LV endocardium. Based on limited views, all mid-to-apical septal, apical inferior, mid-to-apcial anterior, and apex appear akinetic. Findings concerning for possible LAD disease. There is moderate asymmetric hypertrophy of the basal septum. The rest of the LV segments demonstrate mild concentric left ventricular hypertrophy. Indeterminate diastolic filling due to E-A fusion.  2. Right ventricular systolic function is normal. The right ventricular size is normal.  3. The mitral valve is grossly normal. There is mild thickening of the mitral valve leaflet(s). There is mild calcification of the mitral valve leaflet(s). Mild to moderate mitral annular calcification. Trivial mitral valve  regurgitation. No evidence of  mitral stenosis.  4. The aortic valve was not well visualized. Aortic valve regurgitation is not visualized.  5. There is flow acceleration noted at the level of the pulmonic valve (poor visualization of the valve) with mean gradient 38mHg, peak gradient 266mg, Vmax 2.6372m Either mild PS or flow acceleration due to vigorous RV systolic function. Comparison(s): Compared to prior echo report, the LVEF is now moderately reduced to 30-35% (previously 60-65%) with wall motion abnormalities as detailed above. FINDINGS  Left Ventricle: Left ventricular ejection fraction, by  estimation, is 30 to 35%. The left ventricle has moderately decreased function. Wall motion difficult to assess due to lack of definity contrast and incomplete visualization of the LV endocardium. Based on limited views, all mid-to-apical septal, apical inferior, mid-to-apcial anterior, and apex appear akinetic. Findings consistent with LAD territory. The left ventricular internal cavity size was normal in size. There is moderate asymmetric hypertrophy of the basal septum. The rest of the LV segments demonstrate mild concentric left ventricular hypertrophy. Indeterminate diastolic filling due to E-A fusion. Right Ventricle: The right ventricular size is normal. No increase in right ventricular wall thickness. Right ventricular systolic function is normal. Left Atrium: Left atrial size was normal in size. Right Atrium: Right atrial size was normal in size. Pericardium: There is no evidence of pericardial effusion. Mitral Valve: The mitral valve is grossly normal. There is mild thickening of the mitral valve leaflet(s). There is mild calcification of the mitral valve leaflet(s). Mild to moderate mitral annular calcification. Trivial mitral valve regurgitation. No evidence of mitral valve stenosis. Tricuspid Valve: The tricuspid valve is normal in structure. Tricuspid valve regurgitation is trivial. Aortic Valve: The  aortic valve was not well visualized. Aortic valve regurgitation is not visualized. Pulmonic Valve: The pulmonic valve was not well visualized. Pulmonic valve regurgitation is trivial. Aorta: The aortic root is normal in size and structure. Pulmonary Artery: There is flow acceleration noted at the level of the pulmonic valve (poor visualization of the valve) with mean gradient 70mHg, peak gradient 273mg, Vmax 2.6348m Either mild PS or flow acceleration due to vigorous RV systolic function. IAS/Shunts: No atrial level shunt detected by color flow Doppler.  LEFT VENTRICLE PLAX 2D LVIDd:         3.20 cm LVIDs:         2.50 cm LV PW:         1.40 cm LV IVS:        1.50 cm LVOT diam:     1.80 cm LV SV:         44 LV SV Index:   20 LVOT Area:     2.54 cm  LV Volumes (MOD) LV vol d, MOD A4C: 61.6 ml LV vol s, MOD A4C: 42.6 ml LV SV MOD A4C:     61.6 ml LEFT ATRIUM             Index LA diam:        3.25 cm 1.50 cm/m LA Vol (A2C):   43.9 ml 20.20 ml/m LA Vol (A4C):   51.0 ml 23.46 ml/m LA Biplane Vol: 46.8 ml 21.53 ml/m  AORTIC VALVE             PULMONIC VALVE LVOT Vmax:   94.60 cm/s  PV Vmax:       2.63 m/s LVOT Vmean:  60.400 cm/s PV Vmean:      158.000 cm/s LVOT VTI:    0.172 m     PV VTI:        0.421 m                          PV Peak grad:  27.7 mmHg AORTA                    PV Mean grad:  12.0 mmHg Ao Root diam: 3.10 cm   SHUNTS Systemic VTI:  0.17 m Systemic Diam: 1.80 cm HeaGwyndolyn Kaufman Electronically signed by HeaGwyndolyn Kaufman Signature Date/Time:  04/06/2021/6:53:25 PM    Final (Updated)      Assessment and Plan:   1. Recurrent chronic combined CHF: patient with prior history of NICM/dilated cardiomyopathy with EF 20% in 2009 with LHC at that time revealing normal coronary arteries. She had subsequent recovery of EF, most recently 60-65% on echo in 2017. She presented with progressively worsening SOB over the past several months. She was found to have small pleural effusions on CT. Echo this  admission with drop in EF again to 30-35% with RWMA in apical region. She was given IV lasix 95m in the ED. - Continue home carvedilol and spironolactone - Will start losartan 224mdaily - Will continue IV lasix 4032mID for now - Continue to monitor strict I&Os and daily weights - Continue to monitor electrolytes and replete as needed to maintain K >4, Mg>2 - Wean off BiPap as tolerated  2. HTN: BP poorly controlled - Anticipate management in the setting of #1  3. Paroxysmal atrial fibrillation: not on anticoagulation due to fall risk. Appears to be maintaining sinus rhythm this admission.  - Continue carvedilol for rate control  4. Anemia: Hgb 8.8 on admission, down from 13.2 on last check in 2020. Ordered for 1 uPRBC. Ordered for IV iron and po supplement to start tomorrow - Continue management per primary team   Risk Assessment/Risk Scores:        New York Heart Association (NYHA) Functional Class NYHA Class III  CHA2DS2-VASc Score = 5 This indicates a 7.2% annual risk of stroke. The patient's score is based upon: CHF History: Yes HTN History: Yes Diabetes History: No Stroke History: No Vascular Disease History: No Age Score: 2 Gender Score: 1      For questions or updates, please contact [email or web address] consult [email or web address] for contact info under    Signed, KriAbigail ButtsA-C  04/06/2021 8:59 PM  ---------------------------------------------------------------------------------------------   History and all data above reviewed.  Patient examined.  I agree with the findings as above.  KatSOMARA FRYMIRE a [age] female currently admitted for recurrent heart failure that is at least subacute in onset.  History is as noted above with prior reduced ejection fraction and recovery of EF.  She tells me that for weeks she has been telling her assisted living facility that she has leg swelling and shortness of breath and has been having more and more  nonproductive cough.  She feels the shortness of breath and leg swelling are coincident findings and likely related.  She denies episodic chest pain but does note a burning sensation in her chest.  Previously normal coronary arteries when first checked at the time of reduced EF discovery in 2009.  Echocardiogram this admission shows newly reduced EF with possible apical regional wall motion abnormalities.  She is also noted to be somewhat hypertensive.  Cardiology asked to consult for further medication recommendations.  I discussed in detail with the patient the medical management of newly reduced ejection fraction as well as interventional options to further assess etiology.  She does not feel a cardiac catheterization or even stress testing would be of much benefit at this point given her age.  She states repeatedly that she "hopes that when it is her time to go, God will take her in her sleep."  She feels she almost died last night but woke herself up with loud breathing which may have been apnea versus wheezing.  She got up out of bed to find out where the  sound came from and later realized it may have been her own breathing which created the loud noise.  She is currently with BiPAP in place, but is able to speak in full sentences without shortness of breath and may be able to wean BiPAP shortly.  Constitutional: No acute distress, BiPAP in place Eyes: pupils equally round and reactive to light, sclera non-icteric, normal conjunctiva and lids ENMT:  moist mucous membranes Cardiovascular: regular rhythm, normal rate, no murmurs. S1 and S2 normal. Radial pulses normal bilaterally.  JVD noted to the middle one third of the neck at 30 degrees. Respiratory: Lateral basilar crackles. GI : normal bowel sounds, soft and nontender. No distention.   MSK: extremities warm, well perfused.  1+ puffy edema with compression socks on. NEURO: grossly nonfocal exam, moves all extremities. PSYCH: alert and oriented x 3,  normal mood and affect.   All available labs, radiology testing, previous records reviewed. Agree with documented assessment and plan of my colleague as stated above with the following additions or changes:  Active Problems:   CHF (congestive heart failure) ([place])    Plan: She has newly reduced ejection fraction but is not keen to have procedures to assess this further.  I think medical management strategy is appropriate at this time.  Continue home medications and start losartan for additional blood pressure control.  We will adjust antihypertensive therapy accordingly.  I also think she needs more Lasix, we will give at least 40 mg tonight and 40 mg of IV Lasix in the morning and assess for response.  Medical optimization seems to be her goal, this is very reasonable at her age.  I have attempted to call her niece [name] at her request, I was unable to get through but left a message with brief details.  Remainder as above.   Length of Stay:  LOS: 0 days   Elouise Munroe, MD [facility] 9:15 PM  04/06/2021

## 2021-04-06 NOTE — ED Notes (Signed)
RN messaged main pharmacy for sending of due/overdue medications

## 2021-04-06 NOTE — ED Provider Notes (Signed)
[facility] EMERGENCY DEPARTMENT Provider Note   CSN: [id number] Arrival date & time: 04/06/21  0935     History Chief Complaint  Patient presents with  . Shortness of Breath    [name] is a 87 y.o. female.  Presented to ER with concern for cough, shortness of breath.  Symptoms ongoing for the past week or 2.  Has had clear mucus from cough.  Breathing felt worse since last night.  States it improves with breathing treatments but still feels somewhat short of breath.  Unsure of fever.  No body aches.  No known COVID exposures.  No chest pain.  HPI     Past Medical History:  Diagnosis Date  . Atrial fibrillation (HCC)   . Cholelithiasis   . Constipation   . Diabetes mellitus   . Dizziness    with some gait disability  . GERD (gastroesophageal reflux disease)   . Hepatic cyst   . Hyperlipidemia   . Hypertension   . Ischemic cardiomyopathy   . LBBB (left bundle branch block)   . Macular degeneration   . Other postprocedural status(V45.89)   . Peripheral edema     Patient Active Problem List   Diagnosis Date Noted  . Reactive airway disease 12/09/2017  . Anxiety and depression 12/09/2017  . Acute respiratory failure (HCC) 12/09/2017  . Normocytic anemia 04/13/2016  . Acute on chronic combined systolic and diastolic CHF (congestive heart failure) (HCC) 04/13/2016  . Pyelonephritis 04/12/2016  . Chronic combined systolic and diastolic congestive heart failure (HCC) 11/14/2013  . Constipation 11/14/2013  . Nausea 11/14/2013  . Influenza with respiratory manifestations 11/09/2013  . AKI (acute kidney injury) (HCC) 11/09/2013  . Influenza 11/07/2013  . Acute on chronic combined systolic and diastolic congestive heart failure (HCC) 11/07/2013  . Acute respiratory failure with hypoxia (HCC) 11/07/2013  . Fever 11/07/2013  . Generalized weakness 11/07/2013  . Peripheral neuropathy 05/05/2012  . Closed left hip fracture (HCC) 05/04/2012  .  Chronic systolic HF (heart failure) (HCC) 05/04/2012  . Arthritis 07/23/2011  . CHF (congestive heart failure) (HCC) 07/23/2011  . DYSPNEA 07/31/2010  . DEPRESSIVE DISORDER NOT ELSEWHERE CLASSIFIED 02/08/2009  . Type 2 diabetes mellitus with diabetic neuropathy, without long-term current use of insulin (HCC) 11/08/2008  . HYPERLIPIDEMIA-MIXED 11/08/2008  . Essential hypertension 11/08/2008  . LBBB (left bundle branch block) 11/08/2008  . Atrial fibrillation (HCC) 11/08/2008  . GERD 11/08/2008  . HEPATIC CYST 11/08/2008  . CHOLELITHIASIS 11/08/2008  . DILATION AND CURETTAGE, HX OF 11/08/2008    Past Surgical History:  Procedure Laterality Date  . ANKLE SURGERY Bilateral    fractures  . CHOLECYSTECTOMY    . Dilation and curettage    . HIP ARTHROPLASTY  05/05/2012   Procedure: ARTHROPLASTY BIPOLAR HIP;  Surgeon: Shelda Pal, MD;  Location: [facility]S;  Service: Orthopedics;  Laterality: Left;  . TONSILLECTOMY    . WRIST SURGERY Bilateral    wrist fractures     OB History   No obstetric history on file.     Family History  Problem Relation Age of Onset  . Leukemia Mother   . Arthritis Father   . Heart failure Brother        died age 15  . Arthritis Brother     Social History   Tobacco Use  . Smoking status: Never Smoker  . Smokeless tobacco: Never Used  . 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  Vaping Use: Never used  Substance Use Topics  . Alcohol use: No  . Drug use: No    Home Medications Prior to Admission medications   Medication Sig Start Date End Date Taking? Authorizing Provider  acetaminophen (TYLENOL) 325 MG tablet Take 325 mg by mouth every 6 (six) hours as needed (pain).    [provider]  acetaminophen (TYLENOL) 500 MG tablet Take 500 mg by mouth every evening.    [provider]  aspirin 81 MG chewable tablet Chew 81 mg by mouth daily.     [provider]  benzonatate  (TESSALON) 100 MG capsule Take 100 mg by mouth 3 (three) times daily as needed for cough.    [provider]  carvedilol (COREG) 6.25 MG tablet Take 6.25 mg by mouth 2 (two) times daily. 09/03/17   [provider]  colestipol (COLESTID) 1 g tablet Take 2 tablets (2 g total) by mouth daily. Hold FOR CONSTIPATION Patient not taking: Reported on 07/02/2019 12/11/17   Darlin Drop, DO  diazepam (VALIUM) 5 MG tablet Take 2.5-5 mg by mouth See admin instructions. Take 2.5mg  by mouth in the morning and take 5mg  by mouth before bedtime    [provider]  guaifenesin (ROBITUSSIN) 100 MG/5ML syrup Take 200 mg by mouth every 8 (eight) hours as needed for cough.    [provider]  lactobacillus acidophilus & bulgar (LACTINEX) chewable tablet Chew 1 tablet by mouth daily.    [provider]  levothyroxine (SYNTHROID, LEVOTHROID) 25 MCG tablet Take 25 mcg by mouth daily before breakfast.    [provider]  loperamide (IMODIUM A-D) 2 MG tablet Take 2-4 mg by mouth See admin instructions. Take 2 tablets (4 mg) by mouth as needed after 1st loose stool followed by 1 tablet (2 mg) after each subsequent loose stool.    [provider]  magnesium hydroxide (MILK OF MAGNESIA) 400 MG/5ML suspension Take 15 mLs by mouth daily as needed (constipation).     [provider]  mirtazapine (REMERON) 30 MG tablet Take 30 mg by mouth at bedtime.     [provider]  Multiple Vitamins-Minerals (PRESERVISION AREDS PO) Take 1 tablet by mouth daily.     [provider]  omeprazole (PRILOSEC) 20 MG capsule Take 20 mg by mouth daily.    [provider]  ondansetron (ZOFRAN) 4 MG tablet Take 4 mg by mouth daily as needed for nausea.  06/09/17   [provider]  sertraline (ZOLOFT) 100 MG tablet Take 100 mg by mouth daily. 08/25/16   [provider]  simvastatin (ZOCOR) 40 MG tablet Take 40 mg by mouth daily.    [provider]  spironolactone (ALDACTONE) 25 MG tablet Take 0.5 tablets (12.5 mg total) by mouth daily. 02/21/17   04/23/17, MD  torsemide (DEMADEX) 20 MG tablet Take 20 mg by mouth daily.    [provider]    Allergies    Fosamax [alendronate sodium], Penicillins, Sulfonamide derivatives, and Tetanus toxoid  Review of Systems   Review of Systems  Constitutional: Positive for chills and fatigue. Negative for fever.  HENT: Negative for ear pain and sore throat.   Eyes: Negative for pain and visual disturbance.  Respiratory: Positive for cough and shortness of breath.   Cardiovascular: Negative for chest pain and palpitations.  Gastrointestinal: Negative for abdominal pain and vomiting.  Genitourinary: Negative for dysuria and hematuria.  Musculoskeletal: Negative for arthralgias and back pain.  Skin: Negative for color change and rash.  Neurological: Negative for seizures and syncope.  All other systems reviewed and are negative.   Physical Exam Updated Vital Signs BP (!) 173/82   Pulse 89   Temp 98.5 F (36.9 C) (Oral)   Resp (!) 31   Ht 5\' 10"  (1.778 m)   Wt 99.8 kg   SpO2 95%   BMI 31.57 kg/m   Physical Exam Vitals and nursing note reviewed.  Constitutional:      General: She is not in acute distress.    Appearance: She is well-developed.  HENT:     Head: Normocephalic and atraumatic.  Eyes:     Conjunctiva/sclera: Conjunctivae normal.  Cardiovascular:     Rate and Rhythm: Normal rate and regular rhythm.     Heart sounds: No murmur heard.   Pulmonary:     Comments: Bilateral expiratory wheeze, mild tachypnea but speaks in full sentences Abdominal:     Palpations: Abdomen is soft.     Tenderness: There is no abdominal tenderness.  Musculoskeletal:     Cervical back: Neck supple.  Skin:    General: Skin is warm and dry.  Neurological:     General: No focal deficit present.     Mental Status: She is alert.     ED Results / Procedures /  Treatments   Labs (all labs ordered are listed, but only abnormal results are displayed) Labs Reviewed  BASIC METABOLIC PANEL - Abnormal; Notable for the following components:      Result Value   Glucose, Bld 148 (*)    GFR, Estimated 54 (*)    All other components within normal limits  CBC - Abnormal; Notable for the following components:   RBC 3.79 (*)    Hemoglobin 8.8 (*)    HCT 29.3 (*)    MCV 77.3 (*)    MCH 23.2 (*)    RDW 16.5 (*)    All other components within normal limits  RESP PANEL BY RT-PCR (FLU A&B, COVID) ARPGX2  IRON AND TIBC  FERRITIN  RETICULOCYTES  TROPONIN I (HIGH SENSITIVITY)  TROPONIN I (HIGH SENSITIVITY)    EKG None  Radiology DG Chest Portable 1 View  Result Date: 04/06/2021 CLINICAL DATA:  Shortness of breath, cough EXAM: PORTABLE CHEST 1 VIEW COMPARISON:  10/19/2020 FINDINGS: Heart is normal size. Femoral heel thickening and increased interstitial markings particularly in the lung bases. No effusions. No acute bony abnormality. IMPRESSION: Peribronchial thickening and interstitial prominence in the lower lobes could reflect bronchitis or viral/atypical pneumonia. Less likely edema given the normal heart size. Electronically Signed   By: 14/12/2019 M.D.   On: 04/06/2021 10:52    Procedures .Critical Care Performed by: 04/08/2021, MD Authorized by: Milagros Loll, MD   Critical care provider statement:    Critical care time (minutes):  35   Critical care was necessary to treat or prevent imminent or life-threatening deterioration of the following conditions:  Respiratory failure   Critical care was time spent personally by me on the following activities:  Discussions with consultants, evaluation of patient's response to treatment, examination of patient, ordering and performing treatments and interventions, ordering and review of laboratory studies, ordering and review of radiographic studies, pulse oximetry, re-evaluation of patient's  condition, obtaining history from patient or surrogate and review of old charts     Medications Ordered in ED Medications  ipratropium-albuterol (DUONEB) 0.5-2.5 (3) MG/3ML nebulizer solution 3 mL (has no administration in time  range)  methylPREDNISolone sodium succinate (SOLU-MEDROL) 125 mg/2 mL injection 125 mg (125 mg Intravenous Given 04/06/21 1130)  albuterol (VENTOLIN HFA) 108 (90 Base) MCG/ACT inhaler 2 puff (2 puffs Inhalation Given 04/06/21 1220)  cefTRIAXone (ROCEPHIN) 1 g in sodium chloride 0.9 % 100 mL IVPB (0 g Intravenous Stopped 04/06/21 1311)  azithromycin (ZITHROMAX) tablet 500 mg (500 mg Oral Given 04/06/21 1220)    ED Course  I have reviewed the triage vital signs and the nursing notes.  Pertinent labs & imaging results that were available during my care of the patient were reviewed by me and considered in my medical decision making (see chart for details).    MDM Rules/Calculators/A&P                         87 year old lady presenting to ER with concern for cough, shortness of breath.  On exam patient not in distress but was noted to have mild tachypnea and expiratory wheezing.  Chest x-ray concerning for bronchitis versus atypical pneumonia.  COVID and flu testing negative.  Given chest x-ray findings and productive cough, suspect patient may have pneumonia or bronchitis.  Started on antibiotics.  Provided albuterol treatment.  Patient did very poorly on ambulation trial, became very dyspneic and borderline hypoxic upon attempting to stand.  Given current clinical status and age, believe she would benefit from admission for further management of suspected asthma exacerbation and pneumonia.  Final Clinical Impression(s) / ED Diagnoses Final diagnoses:  Reactive airway disease with acute exacerbation, unspecified asthma severity, unspecified whether persistent  Community acquired pneumonia, unspecified laterality    Rx / DC Orders ED Discharge Orders    None        Milagros Lollykstra, Lucianna Ostlund S, MD 04/06/21 1336

## 2021-04-06 NOTE — ED Notes (Signed)
RN attempted to call report x2. 

## 2021-04-06 NOTE — ED Notes (Signed)
RN attempted to call report x1 

## 2021-04-06 NOTE — H&P (Addendum)
History and Physical    [name] POE:[id number] DOB: Dec 04, 1922 DOA: 04/06/2021  PCP: [name], MD (Confirm with patient/family/NH records and if not entered, this has to be entered at [facility] point of entry) Patient coming from: Assist living  I have personally briefly reviewed patient's old medical records in [facility]  Chief Complaint: SOB  HPI: [name] is a 87 y.o. female with medical history significant of chronic diastolic CHF, HTN, pancreatic mass, LBBB, hypothyroidism, presented with worsening of shortness of breath.  Patient is a poor historian, she reported she has been having increasing shortness of breath "for several months", but really became worse for the last 2 weeks.  Last 2 days, she was not able to walk even short distance within the room without feeling shortness of breath.  She has had productive cough with clear phlegm and wheezing, for which nurse practitioner at assisted living has been giving her albuterol inhaler, which she has been using 2-3 times a day with some relief.  Denies any fever chills.  She also complains about intermittent left-sided abdominal pain, and for the last few months she has noticed her stool color has turned darker.  She has chronic constipation has been using milk of magnesium every day.  ED Course: Chest x-ray suspect for bilateral infiltrates versus edema, normal heart size.  Blood work showed new anemia hemoglobin 8.8 compared to 13 last year.  Review of Systems: As per HPI otherwise 14 point review of systems negative.    Past Medical History:  Diagnosis Date  . Atrial fibrillation (HCC)   . Cholelithiasis   . Constipation   . Diabetes mellitus   . Dizziness    with some gait disability  . GERD (gastroesophageal reflux disease)   . Hepatic cyst   . Hyperlipidemia   . Hypertension   . Ischemic cardiomyopathy   . LBBB (left bundle branch block)   . Macular degeneration   . Other postprocedural  status(V45.89)   . Peripheral edema     Past Surgical History:  Procedure Laterality Date  . ANKLE SURGERY Bilateral    fractures  . CHOLECYSTECTOMY    . Dilation and curettage    . HIP ARTHROPLASTY  05/05/2012   Procedure: ARTHROPLASTY BIPOLAR HIP;  Surgeon: Shelda Pal, MD;  Location: [facility];  Service: Orthopedics;  Laterality: Left;  . TONSILLECTOMY    . WRIST SURGERY Bilateral    wrist fractures     reports that she has never smoked. She has never used smokeless tobacco. She reports that she does not drink alcohol and does not use drugs.  Allergies  Allergen Reactions  . Fosamax [Alendronate Sodium] Swelling  . Penicillins Swelling and Other (See Comments)    Swelling, redness and warmth at injection site Has patient had a PCN reaction causing immediate rash, facial/tongue/throat swelling, SOB or lightheadedness with hypotension: No Has patient had a PCN reaction causing severe rash involving mucus membranes or skin necrosis: No Has patient had a PCN reaction that required hospitalization: No Has patient had a PCN reaction occurring within the last 10 years: No If all of the above answers are "NO", then may proceed with Cephalosporin use.  . Sulfonamide Derivatives Swelling  . Tetanus Toxoid Swelling    Family History  Problem Relation Age of Onset  . Leukemia Mother   . Arthritis Father   . Heart failure Brother        died age 25  . Arthritis Brother  Prior to Admission medications   Medication Sig Start Date End Date Taking? Authorizing Provider  acetaminophen (TYLENOL) 325 MG tablet Take 325 mg by mouth every 6 (six) hours as needed (pain).    [provider]  acetaminophen (TYLENOL) 500 MG tablet Take 500 mg by mouth every evening.    [provider]  aspirin 81 MG chewable tablet Chew 81 mg by mouth daily.     [provider]  benzonatate (TESSALON) 100 MG capsule Take 100 mg by mouth 3 (three) times daily as needed for cough.     [provider]  carvedilol (COREG) 6.25 MG tablet Take 6.25 mg by mouth 2 (two) times daily. 09/03/17   [provider]  colestipol (COLESTID) 1 g tablet Take 2 tablets (2 g total) by mouth daily. Hold FOR CONSTIPATION Patient not taking: Reported on 07/02/2019 12/11/17   Darlin DropHall, Carole N, DO  diazepam (VALIUM) 5 MG tablet Take 2.5-5 mg by mouth See admin instructions. Take 2.5mg  by mouth in the morning and take 5mg  by mouth before bedtime    [provider]  guaifenesin (ROBITUSSIN) 100 MG/5ML syrup Take 200 mg by mouth every 8 (eight) hours as needed for cough.    [provider]  lactobacillus acidophilus & bulgar (LACTINEX) chewable tablet Chew 1 tablet by mouth daily.    [provider]  levothyroxine (SYNTHROID, LEVOTHROID) 25 MCG tablet Take 25 mcg by mouth daily before breakfast.    [provider]  loperamide (IMODIUM A-D) 2 MG tablet Take 2-4 mg by mouth See admin instructions. Take 2 tablets (4 mg) by mouth as needed after 1st loose stool followed by 1 tablet (2 mg) after each subsequent loose stool.    [provider]  magnesium hydroxide (MILK OF MAGNESIA) 400 MG/5ML suspension Take 15 mLs by mouth daily as needed (constipation).     [provider]  mirtazapine (REMERON) 30 MG tablet Take 30 mg by mouth at bedtime.     [provider]  Multiple Vitamins-Minerals (PRESERVISION AREDS PO) Take 1 tablet by mouth daily.     [provider]  omeprazole (PRILOSEC) 20 MG capsule Take 20 mg by mouth daily.    [provider]  ondansetron (ZOFRAN) 4 MG tablet Take 4 mg by mouth daily as needed for nausea.  06/09/17   [provider]  sertraline (ZOLOFT) 100 MG tablet Take 100 mg by mouth daily. 08/25/16   [provider]  simvastatin (ZOCOR) 40 MG tablet Take 40 mg by mouth daily.    [provider]  spironolactone (ALDACTONE) 25 MG tablet Take 0.5 tablets (12.5 mg total) by  mouth daily. 02/21/17   Wendall StadeNishan, Peter C, MD  torsemide (DEMADEX) 20 MG tablet Take 20 mg by mouth daily.    [provider]    Physical Exam: Vitals:   04/06/21 1130 04/06/21 1200 04/06/21 1230 04/06/21 1300  BP: (!) 159/70 (!) 167/81 (!) 177/94 (!) 173/82  Pulse: 85 86 90 89  Resp: (!) 21 (!) 21 (!) 28 (!) 31  Temp:      TempSrc:      SpO2: 95% 96% 98% 95%  Weight:      Height:        Constitutional: NAD, calm, comfortable Vitals:   04/06/21 1130 04/06/21 1200 04/06/21 1230 04/06/21 1300  BP: (!) 159/70 (!) 167/81 (!) 177/94 (!) 173/82  Pulse: 85 86 90 89  Resp: (!) 21 (!) 21 (!) 28 (!) 31  Temp:  TempSrc:      SpO2: 95% 96% 98% 95%  Weight:      Height:       Eyes: PERRL, lids and conjunctivae normal ENMT: Mucous membranes are moist. Posterior pharynx clear of any exudate or lesions.Normal dentition.  Neck: normal, supple, no masses, no thyromegaly Respiratory: clear to auscultation bilaterally, diffused wheezing, no crackles.  Increasing respiratory effort, talking in broken sentences. No accessory muscle use.  Cardiovascular: Regular rate and rhythm, no murmurs / rubs / gallops. trace extremity edema. 2+ pedal pulses. No carotid bruits.  Abdomen: tenderness on epigastric area, no rebound, no guardinig, no masses palpated. No hepatosplenomegaly. Bowel sounds positive.  Musculoskeletal: no clubbing / cyanosis. No joint deformity upper and lower extremities. Good ROM, no contractures. Normal muscle tone.  Skin: no rashes, lesions, ulcers. No induration Neurologic: CN 2-12 grossly intact. Sensation intact, DTR normal. Strength 5/5 in all 4.  Psychiatric: Normal judgment and insight. Alert and oriented to person and place, confused about time. Normal mood.     Labs on Admission: I have personally reviewed following labs and imaging studies  CBC: Recent Labs  Lab 04/06/21 1000  WBC 7.8  HGB 8.8*  HCT 29.3*  MCV 77.3*  PLT 160   Basic Metabolic  Panel: Recent Labs  Lab 04/06/21 1000  NA 139  K 4.3  CL 108  CO2 24  GLUCOSE 148*  BUN 13  CREATININE 0.96  CALCIUM 9.1   GFR: Estimated Creatinine Clearance: 43.8 mL/min (by C-G formula based on SCr of 0.96 mg/dL). Liver Function Tests: No results for input(s): AST, ALT, ALKPHOS, BILITOT, PROT, ALBUMIN in the last 168 hours. No results for input(s): LIPASE, AMYLASE in the last 168 hours. No results for input(s): AMMONIA in the last 168 hours. Coagulation Profile: No results for input(s): INR, PROTIME in the last 168 hours. Cardiac Enzymes: No results for input(s): CKTOTAL, CKMB, CKMBINDEX, TROPONINI in the last 168 hours. BNP (last 3 results) No results for input(s): PROBNP in the last 8760 hours. HbA1C: No results for input(s): HGBA1C in the last 72 hours. CBG: No results for input(s): GLUCAP in the last 168 hours. Lipid Profile: No results for input(s): CHOL, HDL, LDLCALC, TRIG, CHOLHDL, LDLDIRECT in the last 72 hours. Thyroid Function Tests: No results for input(s): TSH, T4TOTAL, FREET4, T3FREE, THYROIDAB in the last 72 hours. Anemia Panel: No results for input(s): VITAMINB12, FOLATE, FERRITIN, TIBC, IRON, RETICCTPCT in the last 72 hours. Urine analysis:    Component Value Date/Time   COLORURINE AMBER (A) 04/12/2016 2243   APPEARANCEUR CLOUDY (A) 04/12/2016 2243   LABSPEC 1.024 04/12/2016 2243   PHURINE 5.5 04/12/2016 2243   GLUCOSEU NEGATIVE 04/12/2016 2243   HGBUR SMALL (A) 04/12/2016 2243   BILIRUBINUR SMALL (A) 04/12/2016 2243   KETONESUR 15 (A) 04/12/2016 2243   PROTEINUR NEGATIVE 04/12/2016 2243   UROBILINOGEN 0.2 06/21/2014 1924   NITRITE NEGATIVE 04/12/2016 2243   LEUKOCYTESUR LARGE (A) 04/12/2016 2243    Radiological Exams on Admission: DG Chest Portable 1 View  Result Date: 04/06/2021 CLINICAL DATA:  Shortness of breath, cough EXAM: PORTABLE CHEST 1 VIEW COMPARISON:  10/19/2020 FINDINGS: Heart is normal size. Femoral heel thickening and increased  interstitial markings particularly in the lung bases. No effusions. No acute bony abnormality. IMPRESSION: Peribronchial thickening and interstitial prominence in the lower lobes could reflect bronchitis or viral/atypical pneumonia. Less likely edema given the normal heart size. Electronically Signed   By: Charlett Nose M.D.   On: 04/06/2021 10:52  EKG: Independently reviewed. Chronic LBBB  Assessment/Plan Active Problems:   CHF (congestive heart failure) (HCC)  (please populate well all problems here in Problem List. (For example, if patient is on BP meds at home and you resume or decide to hold them, it is a problem that needs to be her. Same for CAD, COPD, HLD and so on)  Cardiac asthma, acute on chronic diastolic CHF decompensation -No Hx of Asthma or COPD, and wheezing appears coincident with a significant drop of Hb as well as increased BP, raised concern about a acute on chronic diastolic CHF decompensation. -Change torsemide to IV Lasix 20 mg daily. -More stringent control of blood pressure, continue Coreg and spironolactone, add as needed hydralazine. -Echocardiogram, TSH -BIPAP to relieve breathing effort.  Bronchospasm -Likely from CHF decompensation, add short course of p.o. steroid. -Unable to determine concurrent atypical pneumonia, will give short course of p.o. doxycycline.  Anemia, microcytic -Suspect a chronic GI bleed source -With a Hx of pancreatic mass, which was last imaged in 2019, with unsure follow up Hx. Left POA/Niece a message.  -Check iron study. -Unsure about the blood loss rate, will hold off chemical DVT prophylaxis for now, until demonstrated stabilization of H/H. -Consider PRBC if symptoms not improving today.  Pancreatic mass -Tried to reach POA to discuss about various options (left a message), patient cannot provide me with any explanation for the loss of follow-up in the last 3+ years for the pancreatic lesion.  D/W on call radiologist Dr. Elmer Picker  regarding CT versus MRI to investigate, given the breathing status, patient will less likely tolerate MRI, and given her age, CT with contrast use raised concern about AKI, which she appeared to have in the past. In all, decided to do CT abd and pelvic w/o contrast.  DVT prophylaxis: SCD Code Status: DNR as per patient, will confirm with POA when she calls back Family Communication: Left POA message. Disposition Plan: Expect more than 2 midnight hospital stay Consults called: None Admission status: Tele admit   Emeline General MD [facility] Pager [phone]  04/06/2021, 1:50 PM

## 2021-04-06 NOTE — Progress Notes (Signed)
  Echocardiogram 2D Echocardiogram has been performed.  Roosvelt Maser F 04/06/2021, 5:51 PM

## 2021-04-06 NOTE — ED Notes (Signed)
Patient transported to CT 

## 2021-04-06 NOTE — ED Triage Notes (Signed)
Pt arrived via [facility] from home for c/o SOB and productive cough w/clear mucousx2 wks. Pt states it got worse last night. Per pt she was given nebulizer by PCP. EMS gave albuterol. Pt is A&Ox4. Pt has non labored breathing. VSS

## 2021-04-06 NOTE — Progress Notes (Signed)
Patient placed on bipap per MD order.  Patient currently tolerating well.  Will continue to monitor.  

## 2021-04-06 NOTE — ED Notes (Signed)
I sat pt up in bed and her Sa02 dropped to 92%. Pt started to have labored breathing and was tachypneic. I tried to stand pt up, but she was unsteady on her feet and stated she felt like she was going to fall so I sat her back down on the bed. Dr Stevie Kern is aware.

## 2021-04-06 NOTE — Progress Notes (Signed)
Echo LEVF 30-35%, confirm patient has cardiac asthma.  Continue diuresis.  Appears to be needing a more stringent blood pressure control, will increase her Coreg from 6.25-12.5 twice daily, start hydralazine 25 mg every 6 hours and titrate according to response.  Discussed with on-call cardiology.  Given the situation of decompensated CHF, and anemia, ordered 1 unit PRBC.  Explained to patient and she agreed.  CT scan result reviewed, which showed diverticulosis.  However patient does not recall blood in the stool.  The pancreatic lesions appear to be benign given the no change of the size and location.  Contact GI for anemia.  Iron study low, ordered 1 IV iron and start p.o. supplement tomorrow.  Revisit patient, after 1 dose of 20 mg IV Lasix, patient wheezing appears to be improved. D/C doxycycline.

## 2021-04-07 ENCOUNTER — Inpatient Hospital Stay (HOSPITAL_COMMUNITY): Payer: Medicare HMO

## 2021-04-07 DIAGNOSIS — I5043 Acute on chronic combined systolic (congestive) and diastolic (congestive) heart failure: Secondary | ICD-10-CM

## 2021-04-07 DIAGNOSIS — N1831 Chronic kidney disease, stage 3a: Secondary | ICD-10-CM

## 2021-04-07 DIAGNOSIS — K862 Cyst of pancreas: Secondary | ICD-10-CM

## 2021-04-07 DIAGNOSIS — I509 Heart failure, unspecified: Secondary | ICD-10-CM | POA: Diagnosis not present

## 2021-04-07 DIAGNOSIS — E039 Hypothyroidism, unspecified: Secondary | ICD-10-CM

## 2021-04-07 DIAGNOSIS — T17908A Unspecified foreign body in respiratory tract, part unspecified causing other injury, initial encounter: Secondary | ICD-10-CM

## 2021-04-07 DIAGNOSIS — D509 Iron deficiency anemia, unspecified: Secondary | ICD-10-CM

## 2021-04-07 DIAGNOSIS — Z515 Encounter for palliative care: Secondary | ICD-10-CM | POA: Diagnosis not present

## 2021-04-07 DIAGNOSIS — N183 Chronic kidney disease, stage 3 unspecified: Secondary | ICD-10-CM

## 2021-04-07 LAB — BASIC METABOLIC PANEL
Anion gap: 12 (ref 5–15)
BUN: 18 mg/dL (ref 8–23)
CO2: 27 mmol/L (ref 22–32)
Calcium: 9.7 mg/dL (ref 8.9–10.3)
Chloride: 103 mmol/L (ref 98–111)
Creatinine, Ser: 1.25 mg/dL — ABNORMAL HIGH (ref 0.44–1.00)
GFR, Estimated: 39 mL/min — ABNORMAL LOW (ref >60)
Glucose, Bld: 129 mg/dL — ABNORMAL HIGH (ref 70–99)
Potassium: 3.6 mmol/L (ref 3.5–5.1)
Sodium: 142 mmol/L (ref 135–145)

## 2021-04-07 LAB — LEGIONELLA PNEUMOPHILA SEROGP 1 UR AG: L. pneumophila Serogp 1 Ur Ag: NEGATIVE

## 2021-04-07 LAB — CBC WITH DIFFERENTIAL/PLATELET
Abs Immature Granulocytes: 0.07 10*3/uL (ref 0.00–0.07)
Basophils Absolute: 0 10*3/uL (ref 0.0–0.1)
Basophils Relative: 0 %
Eosinophils Absolute: 0 10*3/uL (ref 0.0–0.5)
Eosinophils Relative: 0 %
HCT: 35.7 % — ABNORMAL LOW (ref 36.0–46.0)
Hemoglobin: 11.5 g/dL — ABNORMAL LOW (ref 12.0–15.0)
Immature Granulocytes: 1 %
Lymphocytes Relative: 15 %
Lymphs Abs: 1.3 10*3/uL (ref 0.7–4.0)
MCH: 24.1 pg — ABNORMAL LOW (ref 26.0–34.0)
MCHC: 32.2 g/dL (ref 30.0–36.0)
MCV: 74.7 fL — ABNORMAL LOW (ref 80.0–100.0)
Monocytes Absolute: 1 10*3/uL (ref 0.1–1.0)
Monocytes Relative: 11 %
Neutro Abs: 6.4 10*3/uL (ref 1.7–7.7)
Neutrophils Relative %: 73 %
Platelets: 174 10*3/uL (ref 150–400)
RBC: 4.78 MIL/uL (ref 3.87–5.11)
RDW: 17.2 % — ABNORMAL HIGH (ref 11.5–15.5)
WBC: 8.7 10*3/uL (ref 4.0–10.5)
nRBC: 0 % (ref 0.0–0.2)

## 2021-04-07 LAB — MRSA PCR SCREENING: MRSA by PCR: NEGATIVE

## 2021-04-07 LAB — BRAIN NATRIURETIC PEPTIDE: B Natriuretic Peptide: 786.3 pg/mL — ABNORMAL HIGH (ref 0.0–100.0)

## 2021-04-07 MED ORDER — POTASSIUM CHLORIDE CRYS ER 20 MEQ PO TBCR: 40.0000 meq | EXTENDED_RELEASE_TABLET | Freq: Once | ORAL | Status: DC

## 2021-04-07 MED ORDER — TORSEMIDE 20 MG PO TABS
10.0000 mg | ORAL_TABLET | Freq: Every day | ORAL | Status: DC
  Administered 2021-04-08 – 2021-04-11 (×4): 10 mg via ORAL
  Filled 2021-04-07 (×5): qty 1

## 2021-04-07 MED ORDER — ONDANSETRON HCL 4 MG/2ML IJ SOLN
4.0000 mg | Freq: Four times a day (QID) | INTRAMUSCULAR | Status: DC
  Administered 2021-04-07: 4 mg via INTRAVENOUS
  Filled 2021-04-07: qty 2

## 2021-04-07 MED ORDER — PANTOPRAZOLE SODIUM 40 MG IV SOLR
40.0000 mg | Freq: Two times a day (BID) | INTRAVENOUS | Status: DC
  Administered 2021-04-07 – 2021-04-09 (×5): 40 mg via INTRAVENOUS
  Filled 2021-04-07 (×6): qty 40

## 2021-04-07 MED ORDER — PROCHLORPERAZINE EDISYLATE 10 MG/2ML IJ SOLN
10.0000 mg | INTRAMUSCULAR | Status: DC | PRN
  Administered 2021-04-07: 10 mg via INTRAVENOUS
  Filled 2021-04-07 (×3): qty 2

## 2021-04-07 NOTE — Progress Notes (Signed)
Upon entering room staff was feeding pt breakfast and pt begun to cough, pt continued to cough and then preceded to vomit, pt also showing sighs of respiratory distress.  MD notified  Pt placed NPO  Speech eval ordered

## 2021-04-07 NOTE — Progress Notes (Signed)
Cardiology Progress Note  Patient ID: [name] MRN: [id number] DOB: 13-Jul-1923 Date of Encounter: 04/07/2021  Primary Cardiologist: [name], MD  Subjective   Chief Complaint: Weakness fatigue.  HPI: Admitted with shortness of breath.  Euvolemic on my exam.  She clinically is not heart failure.  EF 30-35% with regional wall motion normalities.  Medical management recommended per team yesterday.  I agree with this.  No edema.  ROS:  All other ROS reviewed and negative. Pertinent positives noted in the HPI.     Inpatient Medications  Scheduled Meds: . aspirin  81 mg Oral Daily  . carvedilol  6.25 mg Oral BID  . ferrous sulfate  325 mg Oral Q breakfast  . ipratropium-albuterol  3 mL Nebulization Q6H  . lactobacillus acidophilus & bulgar  1 tablet Oral Daily  . levothyroxine  25 mcg Oral QAC breakfast  . losartan  25 mg Oral Daily  . mirtazapine  30 mg Oral QHS  . pantoprazole  40 mg Oral BID AC  . predniSONE  20 mg Oral Q breakfast  . sertraline  100 mg Oral Daily  . simvastatin  40 mg Oral Daily  . sodium chloride flush  3 mL Intravenous Q12H  . spironolactone  12.5 mg Oral Daily   Continuous Infusions: . sodium chloride 250 mL (04/06/21 2344)   PRN Meds: sodium chloride, acetaminophen, ALPRAZolam, benzonatate, guaiFENesin, hydrALAZINE, magnesium hydroxide, ondansetron (ZOFRAN) IV, sodium chloride flush   Vital Signs   Vitals:   04/07/21 0422 04/07/21 0426 04/07/21 0722 04/07/21 0805  BP: 135/63  125/79   Pulse: 82  74   Resp: 17  17   Temp: 98.4 F (36.9 C)  98.3 F (36.8 C)   TempSrc: Oral  Oral   SpO2: 94%  97% 96%  Weight:  89.4 kg    Height:  6' (1.829 m)      Intake/Output Summary (Last 24 hours) at 04/07/2021 1026 Last data filed at 04/07/2021 0425 Gross per 24 hour  Intake 400 ml  Output 1050 ml  Net -650 ml   Last 3 Weights 04/07/2021 04/06/2021 07/02/2019  Weight (lbs) 197 lb 1.5 oz 220 lb 0.3 oz 220 lb  Weight (kg) 89.4 kg 99.8 kg  99.791 kg      Telemetry  Overnight telemetry shows sinus rhythm in the 80s, which I personally reviewed.   ECG  The most recent ECG shows normal sinus rhythm heart rate 85, left bundle branch block, which I personally reviewed.   Physical Exam   Vitals:   04/07/21 0422 04/07/21 0426 04/07/21 0722 04/07/21 0805  BP: 135/63  125/79   Pulse: 82  74   Resp: 17  17   Temp: 98.4 F (36.9 C)  98.3 F (36.8 C)   TempSrc: Oral  Oral   SpO2: 94%  97% 96%  Weight:  89.4 kg    Height:  6' (1.829 m)       Intake/Output Summary (Last 24 hours) at 04/07/2021 1026 Last data filed at 04/07/2021 0425 Gross per 24 hour  Intake 400 ml  Output 1050 ml  Net -650 ml    Last 3 Weights 04/07/2021 04/06/2021 07/02/2019  Weight (lbs) 197 lb 1.5 oz 220 lb 0.3 oz 220 lb  Weight (kg) 89.4 kg 99.8 kg 99.791 kg    Body mass index is 26.73 kg/m.  General: Well nourished, well developed, in no acute distress Head: Atraumatic, normal size  Eyes: PEERLA, EOMI  Neck: Supple, no JVD Endocrine:  No thryomegaly Cardiac: Normal S1, S2; RRR; no murmurs, rubs, or gallops Lungs: Clear to auscultation bilaterally, no wheezing, rhonchi or rales  Abd: Soft, nontender, no hepatomegaly  Ext: No edema, pulses 2+ Musculoskeletal: No deformities, BUE and BLE strength normal and equal Skin: Warm and dry, no rashes   Neuro: Alert and oriented to person, place, time, and situation, CNII-XII grossly intact, no focal deficits  Psych: Normal mood and affect   Labs  High Sensitivity Troponin:   Recent Labs  Lab 04/06/21 1000 04/06/21 1128  TROPONINIHS 11 14     Cardiac EnzymesNo results for input(s): TROPONINI in the last 168 hours. No results for input(s): TROPIPOC in the last 168 hours.  Chemistry Recent Labs  Lab 04/06/21 1000 04/07/21 0516  NA 139 142  K 4.3 3.6  CL 108 103  CO2 24 27  GLUCOSE 148* 129*  BUN 13 18  CREATININE 0.96 1.25*  CALCIUM 9.1 9.7  GFRNONAA 54* 39*  ANIONGAP 7 12     Hematology Recent Labs  Lab 04/06/21 1000 04/06/21 1620 04/07/21 0516  WBC 7.8  --  8.7  RBC 3.79* 4.12 4.78  HGB 8.8*  --  11.5*  HCT 29.3*  --  35.7*  MCV 77.3*  --  74.7*  MCH 23.2*  --  24.1*  MCHC 30.0  --  32.2  RDW 16.5*  --  17.2*  PLT 160  --  174   BNPNo results for input(s): BNP, PROBNP in the last 168 hours.  DDimer No results for input(s): DDIMER in the last 168 hours.   Radiology  CT ABDOMEN PELVIS WO CONTRAST  Result Date: 04/06/2021 CLINICAL DATA:  Abdominal pain EXAM: CT ABDOMEN AND PELVIS WITHOUT CONTRAST TECHNIQUE: Multidetector CT imaging of the abdomen and pelvis was performed following the standard protocol without IV contrast. COMPARISON:  01/28/2018 FINDINGS: Lower chest: Lung bases demonstrate mild pleural effusions bilaterally right slightly greater than left. Some mild associated atelectasis is noted. Hepatobiliary: No focal liver abnormality is seen. Status post cholecystectomy. No biliary dilatation. Pancreas: Fatty interdigitation of the pancreas is noted. Previously seen cystic lesions are again identified and stable. Spleen: Normal in size without focal abnormality. Adrenals/Urinary Tract: Adrenal glands are unremarkable. Kidneys are well visualized bilaterally without renal calculi or obstructive changes. The ureters show no renal calculi. Multiple cysts are noted within the right kidney similar to that seen on the prior exam. The largest of these is noted anteriorly measuring approximately 5.3 cm. Bladder is well distended. Stomach/Bowel: Scattered diverticular change of the colon is noted. No evidence of diverticulitis is seen. The appendix is not well visualized. No inflammatory changes to suggest appendicitis are noted. Small bowel and stomach appear within normal limits. Vascular/Lymphatic: Atherosclerotic calcifications of the abdominal aorta are seen. No aneurysmal dilatation is noted. No sizable adenopathy is seen. Reproductive: Uterus and bilateral  adnexa are unremarkable. Other: No abdominal wall hernia or abnormality. No abdominopelvic ascites. Musculoskeletal: Bilateral hip replacements are seen. Degenerative changes of lumbar spine are noted. No other focal abnormality is seen. IMPRESSION: Small bilateral pleural effusions right greater than left with associated atelectatic changes. Stable cystic lesions within the pancreas. Given their long-term stability they are felt to be benign in etiology. Diverticulosis without diverticulitis. Electronically Signed   By: Alcide Clever M.D.   On: 04/06/2021 15:45   DG Chest Portable 1 View  Result Date: 04/06/2021 CLINICAL DATA:  Shortness of breath, cough EXAM: PORTABLE CHEST 1 VIEW COMPARISON:  10/19/2020 FINDINGS: Heart is normal  size. Femoral heel thickening and increased interstitial markings particularly in the lung bases. No effusions. No acute bony abnormality. IMPRESSION: Peribronchial thickening and interstitial prominence in the lower lobes could reflect bronchitis or viral/atypical pneumonia. Less likely edema given the normal heart size. Electronically Signed   By: Charlett Nose M.D.   On: 04/06/2021 10:52   ECHOCARDIOGRAM COMPLETE  Result Date: 04/06/2021    ECHOCARDIOGRAM REPORT   Patient Name:   [name] Date of Exam: 04/06/2021 Medical Rec #:  [id number]            Height:       70.0 in Accession #:    [id number]           Weight:       220.0 lb Date of Birth:  [birth year]            BSA:          2.174 m Patient Age:    89 years             BP:           155/90 mmHg Patient Gender: F                    HR:           95 bpm. Exam Location:  Inpatient Procedure: 2D Echo, Cardiac Doppler and Color Doppler Indications:     I50.31 Acute diastolic (congestive) heart failure  History:         Patient has prior history of Echocardiogram examinations, most                  recent 04/14/2016. 87 year-old female. nausea.  Sonographer:     Roosvelt Maser Referring Phys:  [id number] Emeline General  Diagnosing Phys: Laurance Flatten MD IMPRESSIONS  1. Left ventricular ejection fraction, by estimation, is 30 to 35%. The left ventricle has moderately decreased function. Wall motion difficult to assess due to lack of definity contrast and incomplete visualization of the LV endocardium. Based on limited views, all mid-to-apical septal, apical inferior, mid-to-apcial anterior, and apex appear akinetic. Findings concerning for possible LAD disease. There is moderate asymmetric hypertrophy of the basal septum. The rest of the LV segments demonstrate mild concentric left ventricular hypertrophy. Indeterminate diastolic filling due to E-A fusion.  2. Right ventricular systolic function is normal. The right ventricular size is normal.  3. The mitral valve is grossly normal. There is mild thickening of the mitral valve leaflet(s). There is mild calcification of the mitral valve leaflet(s). Mild to moderate mitral annular calcification. Trivial mitral valve regurgitation. No evidence of  mitral stenosis.  4. The aortic valve was not well visualized. Aortic valve regurgitation is not visualized.  5. There is flow acceleration noted at the level of the pulmonic valve (poor visualization of the valve) with mean gradient , peak gradient , Vmax 2.54m/s. Either mild PS or flow acceleration due to vigorous RV systolic function. Comparison(s): Compared to prior echo report, the LVEF is now moderately reduced to 30-35% (previously 60-65%) with wall motion abnormalities as detailed above. FINDINGS  Left Ventricle: Left ventricular ejection fraction, by estimation, is 30 to 35%. The left ventricle has moderately decreased function. Wall motion difficult to assess due to lack of definity contrast and incomplete visualization of the LV endocardium. Based on limited views, all mid-to-apical septal, apical inferior, mid-to-apcial anterior, and apex appear akinetic. Findings consistent with LAD territory. The left ventricular  internal cavity size  was normal in size. There is moderate asymmetric hypertrophy of the basal septum. The rest of the LV segments demonstrate mild concentric left ventricular hypertrophy. Indeterminate diastolic filling due to E-A fusion. Right Ventricle: The right ventricular size is normal. No increase in right ventricular wall thickness. Right ventricular systolic function is normal. Left Atrium: Left atrial size was normal in size. Right Atrium: Right atrial size was normal in size. Pericardium: There is no evidence of pericardial effusion. Mitral Valve: The mitral valve is grossly normal. There is mild thickening of the mitral valve leaflet(s). There is mild calcification of the mitral valve leaflet(s). Mild to moderate mitral annular calcification. Trivial mitral valve regurgitation. No evidence of mitral valve stenosis. Tricuspid Valve: The tricuspid valve is normal in structure. Tricuspid valve regurgitation is trivial. Aortic Valve: The aortic valve was not well visualized. Aortic valve regurgitation is not visualized. Pulmonic Valve: The pulmonic valve was not well visualized. Pulmonic valve regurgitation is trivial. Aorta: The aortic root is normal in size and structure. Pulmonary Artery: There is flow acceleration noted at the level of the pulmonic valve (poor visualization of the valve) with mean gradient , peak gradient , Vmax 2.9m/s. Either mild PS or flow acceleration due to vigorous RV systolic function. IAS/Shunts: No atrial level shunt detected by color flow Doppler.  LEFT VENTRICLE PLAX 2D LVIDd:         3.20 cm LVIDs:         2.50 cm LV PW:         1.40 cm LV IVS:        1.50 cm LVOT diam:     1.80 cm LV SV:         44 LV SV Index:   20 LVOT Area:     2.54 cm  LV Volumes (MOD) LV vol d, MOD A4C: 61.6 ml LV vol s, MOD A4C: 42.6 ml LV SV MOD A4C:     61.6 ml LEFT ATRIUM             Index LA diam:        3.25 cm 1.50 cm/m LA Vol (A2C):   43.9 ml 20.20 ml/m LA Vol (A4C):   51.0 ml  23.46 ml/m LA Biplane Vol: 46.8 ml 21.53 ml/m  AORTIC VALVE             PULMONIC VALVE LVOT Vmax:   94.60 cm/s  PV Vmax:       2.63 m/s LVOT Vmean:  60.400 cm/s PV Vmean:      158.000 cm/s LVOT VTI:    0.172 m     PV VTI:        0.421 m                          PV Peak grad:  27.7 mmHg AORTA                    PV Mean grad:  12.0 mmHg Ao Root diam: 3.10 cm   SHUNTS Systemic VTI:  0.17 m Systemic Diam: 1.80 cm Laurance Flatten MD Electronically signed by Laurance Flatten MD Signature Date/Time: 04/06/2021/6:53:25 PM    Final (Updated)     Cardiac Studies  TTE 04/06/2021 - Left ventricle: The cavity size was normal. Wall thickness was  increased in a pattern of mild LVH. Systolic function was normal.  The estimated ejection fraction was in the range of 60% to 65%.  Wall motion was  normal; there were no regional wall motion  abnormalities. Doppler parameters are consistent with abnormal  left ventricular relaxation (grade 1 diastolic dysfunction).  - Ventricular septum: Septal motion showed abnormal function and  dyssynergy.  - Aortic valve: Mildly to moderately calcified annulus. Trileaflet.  - Mitral valve: Calcified annulus. There was trivial regurgitation.  - Left atrium: The atrium was at the upper limits of normal in  size.  - Right atrium: Central venous pressure (est): 3 mm Hg.  - Tricuspid valve: There was trivial regurgitation.  - Pulmonary arteries: Systolic pressure could not be accurately  estimated.  - Pericardium, extracardiac: There was no pericardial effusion.   Patient Profile  [name] is a 87 y.o. female with systolic heart failure with recovery of EF, left bundle branch block, paroxysmal atrial fibrillation (not on anticoagulation due to fall risk), hypertension, hypothyroidism who was admitted on 04/06/2021 with acute systolic heart failure.  Assessment & Plan   1.  Acute systolic heart failure, EF 30-35% -Was reportedly volume overloaded  yesterday.  Euvolemic on my exam.  No need for further diuresis.  Started on torsemide 20 mg daily. -I reviewed her echocardiogram.  Could be stress-induced cardiomyopathy in the setting of left bundle branch block which makes this difficult to diagnose. -I doubt she is had an MI.  EKG shows left bundle branch block and troponins are negative. -To evaluate her CHF a bit better we can check a BNP.  This will help further delineate her volume status. -Given her age she is not a candidate for aggressive cardiovascular care.  She understands this. -Continue home Coreg and Aldactone.  Losartan 25 mg daily was added.  2.  Anemia -Status post transfusion.  Hemoglobin has improved.  Symptoms could be related to anemia as well.  I will defer further work-up of this to the primary team.  3.  Paroxysmal A. Fib -Not on anticoagulation at home due to fall risk. -Clearly she is not anemic and should not be on anticoagulation.  Would continue with her recommendation to not be on this at home.  For questions or updates, please contact [facility] Please consult [email or web address] for contact info under   Time Spent with Patient: I have spent a total of 25 minutes with patient reviewing hospital notes, telemetry, EKGs, labs and examining the patient as well as establishing an assessment and plan that was discussed with the patient.  > 50% of time was spent in direct patient care.    Signed, Lenna GilfordWesley T. Flora Lipps'Neal, MD, North Colorado Medical CenterFACC [place]  [facility]  04/07/2021 10:26 AM

## 2021-04-07 NOTE — Progress Notes (Signed)
PROGRESS NOTE    [name]  VZD:[id number] DOB: [birth year] DOA: 04/06/2021 PCP: [name], MD     Brief Narrative:  [name] is a 87 y.o. female with medical history significant of chronic diastolic CHF, HTN, pancreatic mass, LBBB, hypothyroidism, presented with worsening of shortness of breath.  Patient is a poor historian, she reported she has been having increasing shortness of breath "for several months", but really became worse for the last 2 weeks.  Last 2 days, she was not able to walk even short distance within the room without feeling shortness of breath.  She has had productive cough with clear phlegm and wheezing, for which nurse practitioner at assisted living has been giving her albuterol inhaler, which she has been using 2-3 times a day with some relief.  Denies any fever chills.  She also complains about intermittent left-sided abdominal pain, and for the last few months she has noticed her stool color has turned darker.  She has chronic constipation has been using milk of magnesium every day.  Patient was admitted to the hospital with CHF exacerbation as well as suspected blood loss anemia.  She required BiPAP initially on admission.  New events last 24 hours / Subjective: Patient weaned down to nasal cannula O2.  States that her breathing has improved since admission.  She remains a poor historian overall.  States that she is unsure if she will be strong enough to return back to assisted living facility.  RN notified me that patient had a choking episode with breakfast this morning.  Chest x-ray revealed hazy opacity in the right base.  Patient was made n.p.o., SLP eval ordered.  I spoke on the phone with patient's great niece [name].  She reports that patient was taken off of aspirin on 5/10 for concern of GI bleeding.  She is not sure if patient has been having dark stools.  We discussed concern for patient's decreasing quality of life at ALF,  likely patient will need more assistance and may need SNF on discharge.  I discussed with her palliative care consultation, to discuss goals of care with patient's decreasing quality of life and strength, motivation to live.  [name] will also discuss with her mother [name], who is patient's niece, and holds healthcare power of attorney, but is currently dealing with her own medical issues.   Assessment & Plan:   Principal Problem:   Acute on chronic combined systolic and diastolic congestive heart failure (HCC) Active Problems:   HLD (hyperlipidemia)   Atrial fibrillation (HCC)   GERD   Anxiety and depression   Microcytic anemia   Iron deficiency anemia   CKD (chronic kidney disease) stage 3, GFR 30-59 ml/min (HCC)   Pancreas cyst   Hypothyroid   Aspiration into airway   Acute on chronic systolic and diastolic heart failure History of an ICM/dilated cardiomyopathy -Cardiology following -Continue Coreg, spironolactone, losartan, torsemide -Strict I's and O's, daily weight, fluid restriction diet  Paroxysmal atrial fibrillation -Not on anticoagulation candidate due to GI bleed, fall risk -Continue Coreg  Acute microcytic anemia, iron deficiency anemia -Concern for GI bleeding -Iron supplementation  -GI consulted -Status post 1 unit packed red blood cells -Hold aspirin  -PPI twice daily  Hyperlipidemia -Continue Zocor  Depression/anxiety -Continue Zoloft, Xanax  CKD stage IIIa -Monitor BMP while on diuretics  Pancreatic cyst -Likely benign in nature  Hypothyroidism -Continue Synthroid  Aspiration -Chest x-ray 5/21 revealed hazy opacity in the right base -NPO, SLP evaluation  DVT prophylaxis:  Place and maintain sequential compression device Start: 04/06/21 1402  Code Status:     Code Status Orders  (From admission, onward)         Start     Ordered   04/06/21 1336  Do not attempt resuscitation (DNR)  Continuous       Question Answer Comment  In  the event of cardiac or respiratory ARREST Do not call a "code blue"   In the event of cardiac or respiratory ARREST Do not perform Intubation, CPR, defibrillation or ACLS   In the event of cardiac or respiratory ARREST Use medication by any route, position, wound care, and other measures to relive pain and suffering. May use oxygen, suction and manual treatment of airway obstruction as needed for comfort.   Comments Patient would like to be intubated if respiratory arrest but no treatment if cardiac arrest.      04/06/21 1337        Code Status History    Date Active Date Inactive Code Status Order ID Comments User Context   12/09/2017 1825 12/11/2017 1942 DNR [id number]  Pieter Partridge, MD Inpatient   12/09/2017 1449 12/09/2017 1825 Partial Code [id number]  Pieter Partridge, MD ED   04/13/2016 0050 04/17/2016 1806 DNR [id number]  Alberteen Sam, MD Inpatient   11/07/2013 1659 11/10/2013 1534 Full Code [id number]  Renae Fickle, MD Inpatient   05/05/2012 0039 05/07/2012 1456 Full Code [id number]  Terie Purser, RN Inpatient   Advance Care Planning Activity    Advance Directive Documentation   Flowsheet Row Most Recent Value  Type of Advance Directive Healthcare Power of Attorney, Living will  Pre-existing out of facility DNR order (yellow form or pink MOST form) --  "MOST" Form in Place? --     Family Communication: None at bedside, spoke with great-niece [name] over the phone Disposition Plan:  Status is: Inpatient  Remains inpatient appropriate because:Unsafe d/c plan, IV treatments appropriate due to intensity of illness or inability to take PO and Inpatient level of care appropriate due to severity of illness   Dispo: The patient is from: ALF              Anticipated d/c is to: SNF              Patient currently is not medically stable to d/c.   Difficult to place patient No   Consultants:   Cardiology  GI    Antimicrobials:  Anti-infectives  (From admission, onward)   Start     Dose/Rate Route Frequency Ordered Stop   04/06/21 1415  doxycycline (VIBRA-TABS) tablet 100 mg  Status:  Discontinued        100 mg Oral Every 12 hours 04/06/21 1400 04/06/21 1912   04/06/21 1215  cefTRIAXone (ROCEPHIN) 1 g in sodium chloride 0.9 % 100 mL IVPB        1 g 200 mL/hr over 30 Minutes Intravenous  Once 04/06/21 1208 04/06/21 1311   04/06/21 1215  azithromycin (ZITHROMAX) tablet 500 mg        500 mg Oral  Once 04/06/21 1208 04/06/21 1220        Objective: Vitals:   04/07/21 0422 04/07/21 0426 04/07/21 0722 04/07/21 0805  BP: 135/63  125/79   Pulse: 82  74   Resp: 17  17   Temp: 98.4 F (36.9 C)  98.3 F (36.8 C)   TempSrc: Oral  Oral   SpO2: 94%  97% 96%  Weight:  89.4 kg    Height:  6' (1.829 m)      Intake/Output Summary (Last 24 hours) at 04/07/2021 1053 Last data filed at 04/07/2021 0425 Gross per 24 hour  Intake 400 ml  Output 1050 ml  Net -650 ml   Filed Weights   04/06/21 0942 04/07/21 0426  Weight: 99.8 kg 89.4 kg    Examination:  General exam: Appears calm and comfortable  Respiratory system: Clear to auscultation. Respiratory effort normal. No respiratory distress. No conversational dyspnea.  On nasal cannula O2 Cardiovascular system: S1 & S2 heard. No murmurs. No pedal edema. Gastrointestinal system: Abdomen is nondistended, soft and nontender. Normal bowel sounds heard. Central nervous system: Alert and oriented. No focal neurological deficits. Speech clear.  Extremities: Symmetric in appearance  Skin: No rashes, lesions or ulcers on exposed skin  Psychiatry: Judgement and insight appear normal. Mood & affect appropriate.   Data Reviewed: I have personally reviewed following labs and imaging studies  CBC: Recent Labs  Lab 04/06/21 1000 04/07/21 0516  WBC 7.8 8.7  NEUTROABS  --  6.4  HGB 8.8* 11.5*  HCT 29.3* 35.7*  MCV 77.3* 74.7*  PLT 160 174   Basic Metabolic Panel: Recent Labs  Lab  04/06/21 1000 04/07/21 0516  NA 139 142  K 4.3 3.6  CL 108 103  CO2 24 27  GLUCOSE 148* 129*  BUN 13 18  CREATININE 0.96 1.25*  CALCIUM 9.1 9.7   GFR: Estimated Creatinine Clearance: 33.1 mL/min (A) (by C-G formula based on SCr of 1.25 mg/dL (H)). Liver Function Tests: No results for input(s): AST, ALT, ALKPHOS, BILITOT, PROT, ALBUMIN in the last 168 hours. Recent Labs  Lab 04/06/21 2137  LIPASE 21   No results for input(s): AMMONIA in the last 168 hours. Coagulation Profile: No results for input(s): INR, PROTIME in the last 168 hours. Cardiac Enzymes: No results for input(s): CKTOTAL, CKMB, CKMBINDEX, TROPONINI in the last 168 hours. BNP (last 3 results) No results for input(s): PROBNP in the last 8760 hours. HbA1C: No results for input(s): HGBA1C in the last 72 hours. CBG: No results for input(s): GLUCAP in the last 168 hours. Lipid Profile: No results for input(s): CHOL, HDL, LDLCALC, TRIG, CHOLHDL, LDLDIRECT in the last 72 hours. Thyroid Function Tests: Recent Labs    04/06/21 1620  TSH 1.912   Anemia Panel: Recent Labs    04/06/21 1620  FERRITIN 24  TIBC 377  IRON 24*  RETICCTPCT 1.2   Sepsis Labs: No results for input(s): PROCALCITON, LATICACIDVEN in the last 168 hours.  Recent Results (from the past 240 hour(s))  Resp Panel by RT-PCR (Flu A&B, Covid) Nasopharyngeal Swab     Status: None   Collection Time: 04/06/21 10:47 AM   Specimen: Nasopharyngeal Swab; Nasopharyngeal(NP) swabs in vial transport medium  Result Value Ref Range Status   SARS Coronavirus 2 by RT PCR NEGATIVE NEGATIVE Final    Comment: (NOTE) SARS-CoV-2 target nucleic acids are NOT DETECTED.  The SARS-CoV-2 RNA is generally detectable in upper respiratory specimens during the acute phase of infection. The lowest concentration of SARS-CoV-2 viral copies this assay can detect is 138 copies/mL. A negative result does not preclude SARS-Cov-2 infection and should not be used as the  sole basis for treatment or other patient management decisions. A negative result may occur with  improper specimen collection/handling, submission of specimen other than nasopharyngeal swab, presence of viral mutation(s) within the areas targeted by this assay, and inadequate number of viral  copies(<138 copies/mL). A negative result must be combined with clinical observations, patient history, and epidemiological information. The expected result is Negative.  Fact Sheet for Patients:  BloggerCourse.com  Fact Sheet for Healthcare Providers:  SeriousBroker.it  This test is no t yet approved or cleared by the [place] FDA and  has been authorized for detection and/or diagnosis of SARS-CoV-2 by FDA under an Emergency Use Authorization (EUA). This EUA will remain  in effect (meaning this test can be used) for the duration of the COVID-19 declaration under Section 564(b)(1) of the Act, 21 U.S.C.section 360bbb-3(b)(1), unless the authorization is terminated  or revoked sooner.       Influenza A by PCR NEGATIVE NEGATIVE Final   Influenza B by PCR NEGATIVE NEGATIVE Final    Comment: (NOTE) The Xpert Xpress SARS-CoV-2/FLU/RSV plus assay is intended as an aid in the diagnosis of influenza from Nasopharyngeal swab specimens and should not be used as a sole basis for treatment. Nasal washings and aspirates are unacceptable for Xpert Xpress SARS-CoV-2/FLU/RSV testing.  Fact Sheet for Patients: BloggerCourse.com  Fact Sheet for Healthcare Providers: SeriousBroker.it  This test is not yet approved or cleared by the [place] FDA and has been authorized for detection and/or diagnosis of SARS-CoV-2 by FDA under an Emergency Use Authorization (EUA). This EUA will remain in effect (meaning this test can be used) for the duration of the COVID-19 declaration under Section 564(b)(1) of the  Act, 21 U.S.C. section 360bbb-3(b)(1), unless the authorization is terminated or revoked.  Performed at [facility], [address], [place], [place] [address]   MRSA PCR Screening     Status: None   Collection Time: 04/06/21 10:03 PM   Specimen: Nasal Mucosa; Nasopharyngeal  Result Value Ref Range Status   MRSA by PCR NEGATIVE NEGATIVE Final    Comment:        The GeneXpert MRSA Assay (FDA approved for NASAL specimens only), is one component of a comprehensive MRSA colonization surveillance program. It is not intended to diagnose MRSA infection nor to guide or monitor treatment for MRSA infections. Performed at [facility], [address], [place], [place] [address]       Radiology Studies: CT ABDOMEN PELVIS WO CONTRAST  Result Date: 04/06/2021 CLINICAL DATA:  Abdominal pain EXAM: CT ABDOMEN AND PELVIS WITHOUT CONTRAST TECHNIQUE: Multidetector CT imaging of the abdomen and pelvis was performed following the standard protocol without IV contrast. COMPARISON:  01/28/2018 FINDINGS: Lower chest: Lung bases demonstrate mild pleural effusions bilaterally right slightly greater than left. Some mild associated atelectasis is noted. Hepatobiliary: No focal liver abnormality is seen. Status post cholecystectomy. No biliary dilatation. Pancreas: Fatty interdigitation of the pancreas is noted. Previously seen cystic lesions are again identified and stable. Spleen: Normal in size without focal abnormality. Adrenals/Urinary Tract: Adrenal glands are unremarkable. Kidneys are well visualized bilaterally without renal calculi or obstructive changes. The ureters show no renal calculi. Multiple cysts are noted within the right kidney similar to that seen on the prior exam. The largest of these is noted anteriorly measuring approximately 5.3 cm. Bladder is well distended. Stomach/Bowel: Scattered diverticular change of the colon is noted. No evidence of diverticulitis is seen. The appendix  is not well visualized. No inflammatory changes to suggest appendicitis are noted. Small bowel and stomach appear within normal limits. Vascular/Lymphatic: Atherosclerotic calcifications of the abdominal aorta are seen. No aneurysmal dilatation is noted. No sizable adenopathy is seen. Reproductive: Uterus and bilateral adnexa are unremarkable. Other: No  abdominal wall hernia or abnormality. No abdominopelvic ascites. Musculoskeletal: Bilateral hip replacements are seen. Degenerative changes of lumbar spine are noted. No other focal abnormality is seen. IMPRESSION: Small bilateral pleural effusions right greater than left with associated atelectatic changes. Stable cystic lesions within the pancreas. Given their long-term stability they are felt to be benign in etiology. Diverticulosis without diverticulitis. Electronically Signed   By: Alcide Clever M.D.   On: 04/06/2021 15:45   DG CHEST PORT 1 VIEW  Result Date: 04/07/2021 CLINICAL DATA:  Aspiration into airway. EXAM: PORTABLE CHEST 1 VIEW COMPARISON:  Apr 06, 2021 FINDINGS: There is hazy opacity in the right base. There may be tiny bilateral effusions. Minimal atelectasis in the left base. The cardiomediastinal silhouette is stable. No pneumothorax. No other abnormalities. IMPRESSION: 1. Hazy opacity in the right base could represent effusion and underlying atelectasis, developing infiltrate such as pneumonia, or aspiration. 2. Possible tiny left effusion with underlying atelectasis. Electronically Signed   By: Gerome Sam III M.D   On: 04/07/2021 10:35   DG Chest Portable 1 View  Result Date: 04/06/2021 CLINICAL DATA:  Shortness of breath, cough EXAM: PORTABLE CHEST 1 VIEW COMPARISON:  10/19/2020 FINDINGS: Heart is normal size. Femoral heel thickening and increased interstitial markings particularly in the lung bases. No effusions. No acute bony abnormality. IMPRESSION: Peribronchial thickening and interstitial prominence in the lower lobes could  reflect bronchitis or viral/atypical pneumonia. Less likely edema given the normal heart size. Electronically Signed   By: Charlett Nose M.D.   On: 04/06/2021 10:52   ECHOCARDIOGRAM COMPLETE  Result Date: 04/06/2021    ECHOCARDIOGRAM REPORT   Patient Name:   ELOISE PICONE Date of Exam: 04/06/2021 Medical Rec #:  [id number]            Height:       70.0 in Accession #:    [id number]           Weight:       220.0 lb Date of Birth:  August 19, 1923            BSA:          2.174 m Patient Age:    89 years             BP:           155/90 mmHg Patient Gender: F                    HR:           95 bpm. Exam Location:  Inpatient Procedure: 2D Echo, Cardiac Doppler and Color Doppler Indications:     I50.31 Acute diastolic (congestive) heart failure  History:         Patient has prior history of Echocardiogram examinations, most                  recent 04/14/2016. 87 year-old female. nausea.  Sonographer:     Roosvelt Maser Referring Phys:  [id number] Emeline General Diagnosing Phys: Laurance Flatten MD IMPRESSIONS  1. Left ventricular ejection fraction, by estimation, is 30 to 35%. The left ventricle has moderately decreased function. Wall motion difficult to assess due to lack of definity contrast and incomplete visualization of the LV endocardium. Based on limited views, all mid-to-apical septal, apical inferior, mid-to-apcial anterior, and apex appear akinetic. Findings concerning for possible LAD disease. There is moderate asymmetric hypertrophy of the basal septum. The rest of the LV segments demonstrate mild concentric left ventricular hypertrophy.  Indeterminate diastolic filling due to E-A fusion.  2. Right ventricular systolic function is normal. The right ventricular size is normal.  3. The mitral valve is grossly normal. There is mild thickening of the mitral valve leaflet(s). There is mild calcification of the mitral valve leaflet(s). Mild to moderate mitral annular calcification. Trivial mitral valve regurgitation.  No evidence of  mitral stenosis.  4. The aortic valve was not well visualized. Aortic valve regurgitation is not visualized.  5. There is flow acceleration noted at the level of the pulmonic valve (poor visualization of the valve) with mean gradient , peak gradient , Vmax 2.48m/s. Either mild PS or flow acceleration due to vigorous RV systolic function. Comparison(s): Compared to prior echo report, the LVEF is now moderately reduced to 30-35% (previously 60-65%) with wall motion abnormalities as detailed above. FINDINGS  Left Ventricle: Left ventricular ejection fraction, by estimation, is 30 to 35%. The left ventricle has moderately decreased function. Wall motion difficult to assess due to lack of definity contrast and incomplete visualization of the LV endocardium. Based on limited views, all mid-to-apical septal, apical inferior, mid-to-apcial anterior, and apex appear akinetic. Findings consistent with LAD territory. The left ventricular internal cavity size was normal in size. There is moderate asymmetric hypertrophy of the basal septum. The rest of the LV segments demonstrate mild concentric left ventricular hypertrophy. Indeterminate diastolic filling due to E-A fusion. Right Ventricle: The right ventricular size is normal. No increase in right ventricular wall thickness. Right ventricular systolic function is normal. Left Atrium: Left atrial size was normal in size. Right Atrium: Right atrial size was normal in size. Pericardium: There is no evidence of pericardial effusion. Mitral Valve: The mitral valve is grossly normal. There is mild thickening of the mitral valve leaflet(s). There is mild calcification of the mitral valve leaflet(s). Mild to moderate mitral annular calcification. Trivial mitral valve regurgitation. No evidence of mitral valve stenosis. Tricuspid Valve: The tricuspid valve is normal in structure. Tricuspid valve regurgitation is trivial. Aortic Valve: The aortic valve was  not well visualized. Aortic valve regurgitation is not visualized. Pulmonic Valve: The pulmonic valve was not well visualized. Pulmonic valve regurgitation is trivial. Aorta: The aortic root is normal in size and structure. Pulmonary Artery: There is flow acceleration noted at the level of the pulmonic valve (poor visualization of the valve) with mean gradient , peak gradient , Vmax 2.12m/s. Either mild PS or flow acceleration due to vigorous RV systolic function. IAS/Shunts: No atrial level shunt detected by color flow Doppler.  LEFT VENTRICLE PLAX 2D LVIDd:         3.20 cm LVIDs:         2.50 cm LV PW:         1.40 cm LV IVS:        1.50 cm LVOT diam:     1.80 cm LV SV:         44 LV SV Index:   20 LVOT Area:     2.54 cm  LV Volumes (MOD) LV vol d, MOD A4C: 61.6 ml LV vol s, MOD A4C: 42.6 ml LV SV MOD A4C:     61.6 ml LEFT ATRIUM             Index LA diam:        3.25 cm 1.50 cm/m LA Vol (A2C):   43.9 ml 20.20 ml/m LA Vol (A4C):   51.0 ml 23.46 ml/m LA Biplane Vol: 46.8 ml 21.53 ml/m  AORTIC VALVE  PULMONIC VALVE LVOT Vmax:   94.60 cm/s  PV Vmax:       2.63 m/s LVOT Vmean:  60.400 cm/s PV Vmean:      158.000 cm/s LVOT VTI:    0.172 m     PV VTI:        0.421 m                          PV Peak grad:  27.7 mmHg AORTA                    PV Mean grad:  12.0 mmHg Ao Root diam: 3.10 cm   SHUNTS Systemic VTI:  0.17 m Systemic Diam: 1.80 cm Laurance FlattenHeather Pemberton MD Electronically signed by Laurance FlattenHeather Pemberton MD Signature Date/Time: 04/06/2021/6:53:25 PM    Final (Updated)       Scheduled Meds: . aspirin  81 mg Oral Daily  . carvedilol  6.25 mg Oral BID  . ferrous sulfate  325 mg Oral Q breakfast  . ipratropium-albuterol  3 mL Nebulization Q6H  . lactobacillus acidophilus & bulgar  1 tablet Oral Daily  . levothyroxine  25 mcg Oral QAC breakfast  . losartan  25 mg Oral Daily  . mirtazapine  30 mg Oral QHS  . pantoprazole  40 mg Oral BID AC  . predniSONE  20 mg Oral Q breakfast  .  sertraline  100 mg Oral Daily  . simvastatin  40 mg Oral Daily  . sodium chloride flush  3 mL Intravenous Q12H  . spironolactone  12.5 mg Oral Daily  . torsemide  10 mg Oral Daily   Continuous Infusions: . sodium chloride 250 mL (04/06/21 2344)     LOS: 1 day      Time spent: 45 minutes   Noralee StainJennifer Aleeah Greeno, DO [facility] 04/07/2021, 10:53 AM   Available via Epic secure chat 7am-7pm After these hours, please refer to coverage provider listed on [email or web address]

## 2021-04-07 NOTE — Evaluation (Signed)
Clinical/Bedside Swallow Evaluation Patient Details  Name: [name] MRN: [id number] Date of Birth: [birth year]  Today's Date: 04/07/2021 Time: SLP Start Time (ACUTE ONLY): 1114 SLP Stop Time (ACUTE ONLY): 1129 SLP Time Calculation (min) (ACUTE ONLY): 15 min  Past Medical History:  Past Medical History:  Diagnosis Date  . Atrial fibrillation (HCC)   . Cholelithiasis   . Constipation   . Diabetes mellitus   . Dizziness    with some gait disability  . GERD (gastroesophageal reflux disease)   . Hepatic cyst   . Hyperlipidemia   . Hypertension   . Ischemic cardiomyopathy   . LBBB (left bundle branch block)   . Macular degeneration   . Other postprocedural status(V45.89)   . Peripheral edema    Past Surgical History:  Past Surgical History:  Procedure Laterality Date  . ANKLE SURGERY Bilateral    fractures  . CHOLECYSTECTOMY    . Dilation and curettage    . HIP ARTHROPLASTY  05/05/2012   Procedure: ARTHROPLASTY BIPOLAR HIP;  Surgeon: Shelda Pal, MD;  Location: [facility];  Service: Orthopedics;  Laterality: Left;  . TONSILLECTOMY    . WRIST SURGERY Bilateral    wrist fractures   HPI:  Pt is a 87 yo female presenting with worsening SOB. CXR on admission with bilateral infiltrates versus edema; repeat imaging 5/21 potentially indicative of aspiration in the R lung base. Pt had an episode of coughing and vomiting that morning. PMH includes: GERD, CHF, HTN, pancreatic mass, LBBB, hypothyroidism   Assessment / Plan / Recommendation Clinical Impression  Limited PO trials were given in the setting of pt reported nausea. Oral motor exam was [facility] and she consumed thin liquids without overt signs of oropharyngeal dysphagia. She reports having nausea for a while PTA that has increased in intensity this morning, with dry heaving but not regurgitation observed by SLP. Pt denies any vomiting, but RN reports emesis x1 during coughing spell with breakfast. Based on the above, could  consider starting clear liquid diet with thin liquids, but would defer this decision to MD if they feel it is appropriate with ongoing nausea (as pt could be at risk for post-prandial aspiration if vomiting). Would provide meds in puree pending further assessment by SLP. SLP Visit Diagnosis: Dysphagia, unspecified (R13.10)    Aspiration Risk  Mild aspiration risk;Moderate aspiration risk;Risk for inadequate nutrition/hydration    Diet Recommendation Thin liquid (clear liquid diet if approved by MD)   Liquid Administration via: Cup;Straw Medication Administration: Crushed with puree Supervision: Staff to assist with self feeding;Full supervision/cueing for compensatory strategies Compensations: Slow rate;Small sips/bites Postural Changes: Seated upright at 90 degrees;Remain upright for at least 30 minutes after po intake    Other  Recommendations Oral Care Recommendations: Oral care QID   Follow up Recommendations  (tba)      Frequency and Duration min 2x/week  2 weeks       Prognosis Prognosis for Safe Diet Advancement: Good      Swallow Study   General HPI: Pt is a 87 yo female presenting with worsening SOB. CXR on admission with bilateral infiltrates versus edema; repeat imaging 5/21 potentially indicative of aspiration in the R lung base. Pt had an episode of coughing and vomiting that morning. PMH includes: GERD, CHF, HTN, pancreatic mass, LBBB, hypothyroidism Type of Study: Bedside Swallow Evaluation Previous Swallow Assessment: none in chart Diet Prior to this Study: NPO Temperature Spikes Noted: No Respiratory Status: Nasal cannula History of Recent Intubation: No Behavior/Cognition:  Alert;Cooperative;Pleasant mood;Other (Comment) (HOH) Oral Cavity Assessment: Within Functional Limits Oral Care Completed by SLP: No Oral Cavity - Dentition: Adequate natural dentition Vision: Functional for self-feeding Patient Positioning: Upright in bed Baseline Vocal Quality:  Normal Volitional Cough: Strong Volitional Swallow: Able to elicit    Oral/Motor/Sensory Function Overall Oral Motor/Sensory Function: Within functional limits   Ice Chips Ice chips: Within functional limits Presentation: Spoon   Thin Liquid Thin Liquid: Within functional limits Presentation: Self Fed;Straw    Nectar Thick Nectar Thick Liquid: Not tested   Honey Thick Honey Thick Liquid: Not tested   Puree Puree: Not tested   Solid     Solid: Not tested      Mahala Menghini., M.A. CCC-SLP Acute Rehabilitation Services Pager [phone] Office [phone]  04/07/2021,12:18 PM

## 2021-04-07 NOTE — Consult Note (Signed)
[facility] Gastroenterology Consultation Note  Referring Provider: No ref. provider found Primary Care Physician:  [name], MD Primary Gastroenterologist:  Dr.  Jaquita Rector for Consultation:  anemia  HPI: [name] is a 87 y.o. female admitted for CHF exacerbation.  Had hgb 8.8, today 11.5, baseline 12-13 (8.8 spurious?).  She has lots of respiratory difficulties at this point; unable to tolerate Speech Therapy evaluation.  Has diffuse abdominal pain, worsen lower abdomen; CT scan yesterday (without contrast) showed no acute process.  No blood in stool.  No hematemesis.   Past Medical History:  Diagnosis Date  . Atrial fibrillation (HCC)   . Cholelithiasis   . Constipation   . Diabetes mellitus   . Dizziness    with some gait disability  . GERD (gastroesophageal reflux disease)   . Hepatic cyst   . Hyperlipidemia   . Hypertension   . Ischemic cardiomyopathy   . LBBB (left bundle branch block)   . Macular degeneration   . Other postprocedural status(V45.89)   . Peripheral edema     Past Surgical History:  Procedure Laterality Date  . ANKLE SURGERY Bilateral    fractures  . CHOLECYSTECTOMY    . Dilation and curettage    . HIP ARTHROPLASTY  05/05/2012   Procedure: ARTHROPLASTY BIPOLAR HIP;  Surgeon: Shelda Pal, MD;  Location: [facility];  Service: Orthopedics;  Laterality: Left;  . TONSILLECTOMY    . WRIST SURGERY Bilateral    wrist fractures    Prior to Admission medications   Medication Sig Start Date End Date Taking? Authorizing Provider  acetaminophen (TYLENOL) 500 MG tablet Take 500 mg by mouth every evening.   Yes [provider]  ALPRAZolam Prudy Feeler) 1 MG tablet Take 1 mg by mouth 3 (three) times daily as needed for anxiety.   Yes [provider]  aspirin 81 MG chewable tablet Chew 81 mg by mouth daily.    Yes [provider]  benzonatate (TESSALON) 100 MG capsule Take 100 mg by mouth 3 (three) times daily as needed for cough.   Yes  [provider]  carvedilol (COREG) 6.25 MG tablet Take 6.25 mg by mouth 2 (two) times daily. 09/03/17  Yes [provider]  guaiFENesin (MUCINEX) 600 MG 12 hr tablet Take 600 mg by mouth 2 (two) times daily.   Yes [provider]  guaifenesin (ROBITUSSIN) 100 MG/5ML syrup Take 100 mg by mouth every 8 (eight) hours as needed for cough.   Yes [provider]  hydrOXYzine (ATARAX/VISTARIL) 10 MG tablet Take 10 mg by mouth 3 (three) times daily as needed for itching.   Yes [provider]  lactobacillus acidophilus & bulgar (LACTINEX) chewable tablet Chew 1 tablet by mouth daily.   Yes [provider]  levothyroxine (SYNTHROID, LEVOTHROID) 25 MCG tablet Take 25 mcg by mouth daily before breakfast.   Yes [provider]  loperamide (IMODIUM A-D) 2 MG tablet Take 4 mg by mouth See admin instructions. Take 2 tablets (4 mg) by mouth as needed after 1st loose stool followed by 1 tablet (2 mg) after each subsequent loose stool.   Yes [provider]  magnesium hydroxide (MILK OF MAGNESIA) 400 MG/5ML suspension Take 15 mLs by mouth daily as needed (constipation).    Yes [provider]  mirabegron ER (MYRBETRIQ) 25 MG TB24 tablet Take 25 mg by mouth daily.   Yes [provider]  mirtazapine (REMERON) 30 MG tablet Take 30 mg by mouth at bedtime.  Yes [provider]  Multiple Vitamins-Minerals (PRESERVISION AREDS PO) Take 1 tablet by mouth daily.    Yes [provider]  omeprazole (PRILOSEC) 20 MG capsule Take 20 mg by mouth daily.   Yes [provider]  ondansetron (ZOFRAN) 4 MG tablet Take 4 mg by mouth daily as needed for nausea.  06/09/17  Yes [provider]  polyethylene glycol (MIRALAX / GLYCOLAX) 17 g packet Take 17 g by mouth daily.   Yes [provider]  sertraline (ZOLOFT) 100 MG tablet Take 100 mg by mouth daily. 08/25/16  Yes [provider]  simvastatin  (ZOCOR) 40 MG tablet Take 40 mg by mouth daily.   Yes [provider]  torsemide (DEMADEX) 20 MG tablet Take 20 mg by mouth daily as needed (leg swelling).   Yes [provider]  triamcinolone cream (KENALOG) 0.1 % 1 application 2 (two) times daily as needed (itching). perineum   Yes [provider]  Zinc Oxide (DESITIN) 13 % CREA Apply 1 application topically See admin instructions. Cleanse groin area under breast with soap and water, pat dry and apply ointment on area as needed for redness.   Yes [provider]  colestipol (COLESTID) 1 g tablet Take 2 tablets (2 g total) by mouth daily. Hold FOR CONSTIPATION Patient not taking: No sig reported 12/11/17   Darlin Drop, DO  spironolactone (ALDACTONE) 25 MG tablet Take 0.5 tablets (12.5 mg total) by mouth daily. Patient not taking: Reported on 04/06/2021 02/21/17   Wendall Stade, MD    Current Facility-Administered Medications  Medication Dose Route Frequency Provider Last Rate Last Admin  . 0.9 %  sodium chloride infusion  250 mL Intravenous PRN Mikey College T, MD 5 mL/hr at 04/06/21 2344 250 mL at 04/06/21 2344  . acetaminophen (TYLENOL) tablet 650 mg  650 mg Oral Q4H PRN Mikey College T, MD      . ALPRAZolam Prudy Feeler) tablet 1 mg  1 mg Oral TID PRN Mikey College T, MD      . benzonatate (TESSALON) capsule 100 mg  100 mg Oral TID PRN Mikey College T, MD      . carvedilol (COREG) tablet 6.25 mg  6.25 mg Oral BID Kroeger, Krista M., PA-C   6.25 mg at 04/06/21 2320  . ferrous sulfate tablet 325 mg  325 mg Oral Q breakfast Mikey College T, MD      . guaiFENesin (ROBITUSSIN) 100 MG/5ML solution 200 mg  200 mg Oral Q8H PRN Mikey College T, MD      . hydrALAZINE (APRESOLINE) injection 5 mg  5 mg Intravenous Q6H PRN Mikey College T, MD      . ipratropium-albuterol (DUONEB) 0.5-2.5 (3) MG/3ML nebulizer solution 3 mL  3 mL Nebulization Q6H Mikey College T, MD   3 mL at 04/07/21 0805  . lactobacillus acidophilus & bulgar (LACTINEX)  chewable tablet 1 tablet  1 tablet Oral Daily Mikey College T, MD      . levothyroxine (SYNTHROID) tablet 25 mcg  25 mcg Oral QAC breakfast Emeline General, MD   25 mcg at 04/07/21 [phone]  . losartan (COZAAR) tablet 25 mg  25 mg Oral Daily Judy Pimple M., PA-C   25 mg at 04/06/21 2325  . magnesium hydroxide (MILK OF MAGNESIA) suspension 15 mL  15 mL Oral Daily PRN Mikey College T, MD      . mirtazapine (REMERON) tablet 30 mg  30 mg Oral QHS Emeline General, MD  30 mg at 04/06/21 2320  . ondansetron (ZOFRAN) injection 4 mg  4 mg Intravenous Q6H PRN Mikey College T, MD   4 mg at 04/07/21 1214  . pantoprazole (PROTONIX) EC tablet 40 mg  40 mg Oral BID AC Mikey College T, MD      . predniSONE (DELTASONE) tablet 20 mg  20 mg Oral Q breakfast Mikey College T, MD      . sertraline (ZOLOFT) tablet 100 mg  100 mg Oral Daily Mikey College T, MD      . simvastatin (ZOCOR) tablet 40 mg  40 mg Oral Daily Mikey College T, MD      . sodium chloride flush (NS) 0.9 % injection 3 mL  3 mL Intravenous Q12H Mikey College T, MD   3 mL at 04/06/21 2321  . sodium chloride flush (NS) 0.9 % injection 3 mL  3 mL Intravenous PRN Mikey College T, MD      . spironolactone (ALDACTONE) tablet 12.5 mg  12.5 mg Oral Daily Mikey College T, MD      . torsemide [facility]) tablet 10 mg  10 mg Oral Daily O'Neal, Ronnald Ramp, MD        Allergies as of 04/06/2021 - Review Complete 04/06/2021  Allergen Reaction Noted  . Fosamax [alendronate sodium] Swelling 01/12/2013  . Penicillins Swelling and Other (See Comments) 02/08/2009  . Sulfonamide derivatives Swelling 02/08/2009  . Tetanus toxoid Swelling 02/08/2009    Family History  Problem Relation Age of Onset  . Leukemia Mother   . Arthritis Father   . Heart failure Brother        died age 3  . Arthritis Brother     Social History   Socioeconomic History  . Marital status: Widowed    Spouse name: Not on file  . Number of children: Not on file  . Years of education: Not on file  .  Highest education level: Not on file  Occupational History  . Not on file  Tobacco Use  . Smoking status: Never Smoker  . Smokeless tobacco: Never Used  . 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 Vaping Use: Never used  Substance and Sexual Activity  . Alcohol use: No  . Drug use: No  . Sexual activity: Not on file  Other Topics Concern  . Not on file  Social History Narrative   Lives alone, uses a walker.  Does not drive             Social Determinants of Health   Financial Resource Strain: Not on file  Food Insecurity: Not on file  Transportation Needs: Not on file  Physical Activity: Not on file  Stress: Not on file  Social Connections: Not on file  Intimate Partner Violence: Not on file    Review of Systems: As per HPI, all others negative  Physical Exam: Vital signs in last 24 hours: Temp:  [97.5 F (36.4 C)-98.8 F (37.1 C)] 97.9 F (36.6 C) (05/21 1207) Pulse Rate:  [74-103] 81 (05/21 1207) Resp:  [14-33] 18 (05/21 1207) BP: (125-166)/(63-90) 139/74 (05/21 1207) SpO2:  [91 %-100 %] 94 % (05/21 1207) FiO2 (%):  [50 %] 50 % (05/20 2008) Weight:  [89.4 kg] 89.4 kg (05/21 0426) Last BM Date: 04/06/21 General:   Alert,  Tachypneic, moderate ill-appearing Head:  Normocephalic and atraumatic. Eyes:  Sclera clear, no icterus.   Conjunctiva pink. Ears:  Normal auditory acuity. Nose:  No deformity, discharge,  or lesions. Mouth:  No deformity or lesions.  Oropharynx pink & moist. Neck:  Supple; no masses or thyromegaly. Lungs:  Tachypneic Abdomen:  Soft,mild generalized tenderness without peritonitis No masses, hepatosplenomegaly or hernias noted. Normal bowel sounds, without guarding, and without rebound.     Msk:  Symmetrical without gross deformities. Normal posture. Pulses:  Normal pulses noted. Extremities:  Without clubbing or edema. Neurologic:  Alert and  oriented x4;  grossly normal neurologically. Skin:   Intact without significant lesions or rashes. Psych:  Alert and cooperative. Normal mood and affect.   Lab Results: Recent Labs    04/06/21 1000 04/07/21 0516  WBC 7.8 8.7  HGB 8.8* 11.5*  HCT 29.3* 35.7*  PLT 160 174   BMET Recent Labs    04/06/21 1000 04/07/21 0516  NA 139 142  K 4.3 3.6  CL 108 103  CO2 24 27  GLUCOSE 148* 129*  BUN 13 18  CREATININE 0.96 1.25*  CALCIUM 9.1 9.7   LFT No results for input(s): PROT, ALBUMIN, AST, ALT, ALKPHOS, BILITOT, BILIDIR, IBILI in the last 72 hours. PT/INR No results for input(s): LABPROT, INR in the last 72 hours.  Studies/Results: CT ABDOMEN PELVIS WO CONTRAST  Result Date: 04/06/2021 CLINICAL DATA:  Abdominal pain EXAM: CT ABDOMEN AND PELVIS WITHOUT CONTRAST TECHNIQUE: Multidetector CT imaging of the abdomen and pelvis was performed following the standard protocol without IV contrast. COMPARISON:  01/28/2018 FINDINGS: Lower chest: Lung bases demonstrate mild pleural effusions bilaterally right slightly greater than left. Some mild associated atelectasis is noted. Hepatobiliary: No focal liver abnormality is seen. Status post cholecystectomy. No biliary dilatation. Pancreas: Fatty interdigitation of the pancreas is noted. Previously seen cystic lesions are again identified and stable. Spleen: Normal in size without focal abnormality. Adrenals/Urinary Tract: Adrenal glands are unremarkable. Kidneys are well visualized bilaterally without renal calculi or obstructive changes. The ureters show no renal calculi. Multiple cysts are noted within the right kidney similar to that seen on the prior exam. The largest of these is noted anteriorly measuring approximately 5.3 cm. Bladder is well distended. Stomach/Bowel: Scattered diverticular change of the colon is noted. No evidence of diverticulitis is seen. The appendix is not well visualized. No inflammatory changes to suggest appendicitis are noted. Small bowel and stomach appear within normal  limits. Vascular/Lymphatic: Atherosclerotic calcifications of the abdominal aorta are seen. No aneurysmal dilatation is noted. No sizable adenopathy is seen. Reproductive: Uterus and bilateral adnexa are unremarkable. Other: No abdominal wall hernia or abnormality. No abdominopelvic ascites. Musculoskeletal: Bilateral hip replacements are seen. Degenerative changes of lumbar spine are noted. No other focal abnormality is seen. IMPRESSION: Small bilateral pleural effusions right greater than left with associated atelectatic changes. Stable cystic lesions within the pancreas. Given their long-term stability they are felt to be benign in etiology. Diverticulosis without diverticulitis. Electronically Signed   By: Alcide Clever M.D.   On: 04/06/2021 15:45   DG CHEST PORT 1 VIEW  Result Date: 04/07/2021 CLINICAL DATA:  Aspiration into airway. EXAM: PORTABLE CHEST 1 VIEW COMPARISON:  Apr 06, 2021 FINDINGS: There is hazy opacity in the right base. There may be tiny bilateral effusions. Minimal atelectasis in the left base. The cardiomediastinal silhouette is stable. No pneumothorax. No other abnormalities. IMPRESSION: 1. Hazy opacity in the right base could represent effusion and underlying atelectasis, developing infiltrate such as pneumonia, or aspiration. 2. Possible tiny left effusion with underlying atelectasis. Electronically Signed   By: Gerome Sam III M.D   On: 04/07/2021 10:35  DG Chest Portable 1 View  Result Date: 04/06/2021 CLINICAL DATA:  Shortness of breath, cough EXAM: PORTABLE CHEST 1 VIEW COMPARISON:  10/19/2020 FINDINGS: Heart is normal size. Femoral heel thickening and increased interstitial markings particularly in the lung bases. No effusions. No acute bony abnormality. IMPRESSION: Peribronchial thickening and interstitial prominence in the lower lobes could reflect bronchitis or viral/atypical pneumonia. Less likely edema given the normal heart size. Electronically Signed   By: Charlett Nose M.D.   On: 04/06/2021 10:52   ECHOCARDIOGRAM COMPLETE  Result Date: 04/06/2021    ECHOCARDIOGRAM REPORT   Patient Name:   [name] Date of Exam: 04/06/2021 Medical Rec #:  [id number]            Height:       70.0 in Accession #:    [id number]           Weight:       220.0 lb Date of Birth:  1923/06/12            BSA:          2.174 m Patient Age:    89 years             BP:           155/90 mmHg Patient Gender: F                    HR:           95 bpm. Exam Location:  Inpatient Procedure: 2D Echo, Cardiac Doppler and Color Doppler Indications:     I50.31 Acute diastolic (congestive) heart failure  History:         Patient has prior history of Echocardiogram examinations, most                  recent 04/14/2016. 87 year-old female. nausea.  Sonographer:     Roosvelt Maser Referring Phys:  [id number] Emeline General Diagnosing Phys: Laurance Flatten MD IMPRESSIONS  1. Left ventricular ejection fraction, by estimation, is 30 to 35%. The left ventricle has moderately decreased function. Wall motion difficult to assess due to lack of definity contrast and incomplete visualization of the LV endocardium. Based on limited views, all mid-to-apical septal, apical inferior, mid-to-apcial anterior, and apex appear akinetic. Findings concerning for possible LAD disease. There is moderate asymmetric hypertrophy of the basal septum. The rest of the LV segments demonstrate mild concentric left ventricular hypertrophy. Indeterminate diastolic filling due to E-A fusion.  2. Right ventricular systolic function is normal. The right ventricular size is normal.  3. The mitral valve is grossly normal. There is mild thickening of the mitral valve leaflet(s). There is mild calcification of the mitral valve leaflet(s). Mild to moderate mitral annular calcification. Trivial mitral valve regurgitation. No evidence of  mitral stenosis.  4. The aortic valve was not well visualized. Aortic valve regurgitation is not visualized.  5.  There is flow acceleration noted at the level of the pulmonic valve (poor visualization of the valve) with mean gradient , peak gradient , Vmax 2.14m/s. Either mild PS or flow acceleration due to vigorous RV systolic function. Comparison(s): Compared to prior echo report, the LVEF is now moderately reduced to 30-35% (previously 60-65%) with wall motion abnormalities as detailed above. FINDINGS  Left Ventricle: Left ventricular ejection fraction, by estimation, is 30 to 35%. The left ventricle has moderately decreased function. Wall motion difficult to assess due to lack of definity contrast and incomplete visualization of the  LV endocardium. Based on limited views, all mid-to-apical septal, apical inferior, mid-to-apcial anterior, and apex appear akinetic. Findings consistent with LAD territory. The left ventricular internal cavity size was normal in size. There is moderate asymmetric hypertrophy of the basal septum. The rest of the LV segments demonstrate mild concentric left ventricular hypertrophy. Indeterminate diastolic filling due to E-A fusion. Right Ventricle: The right ventricular size is normal. No increase in right ventricular wall thickness. Right ventricular systolic function is normal. Left Atrium: Left atrial size was normal in size. Right Atrium: Right atrial size was normal in size. Pericardium: There is no evidence of pericardial effusion. Mitral Valve: The mitral valve is grossly normal. There is mild thickening of the mitral valve leaflet(s). There is mild calcification of the mitral valve leaflet(s). Mild to moderate mitral annular calcification. Trivial mitral valve regurgitation. No evidence of mitral valve stenosis. Tricuspid Valve: The tricuspid valve is normal in structure. Tricuspid valve regurgitation is trivial. Aortic Valve: The aortic valve was not well visualized. Aortic valve regurgitation is not visualized. Pulmonic Valve: The pulmonic valve was not well visualized.  Pulmonic valve regurgitation is trivial. Aorta: The aortic root is normal in size and structure. Pulmonary Artery: There is flow acceleration noted at the level of the pulmonic valve (poor visualization of the valve) with mean gradient 12mmHg, peak gradient 28mmHg, Vmax 2.5075m/s. Either mild PS or flow acceleration due to vigorous RV systolic function. IAS/Shunts: No atrial level shunt detected by color flow Doppler.  LEFT VENTRICLE PLAX 2D LVIDd:         3.20 cm LVIDs:         2.50 cm LV PW:         1.40 cm LV IVS:        1.50 cm LVOT diam:     1.80 cm LV SV:         44 LV SV Index:   20 LVOT Area:     2.54 cm  LV Volumes (MOD) LV vol d, MOD A4C: 61.6 ml LV vol s, MOD A4C: 42.6 ml LV SV MOD A4C:     61.6 ml LEFT ATRIUM             Index LA diam:        3.25 cm 1.50 cm/m LA Vol (A2C):   43.9 ml 20.20 ml/m LA Vol (A4C):   51.0 ml 23.46 ml/m LA Biplane Vol: 46.8 ml 21.53 ml/m  AORTIC VALVE             PULMONIC VALVE LVOT Vmax:   94.60 cm/s  PV Vmax:       2.63 m/s LVOT Vmean:  60.400 cm/s PV Vmean:      158.000 cm/s LVOT VTI:    0.172 m     PV VTI:        0.421 m                          PV Peak grad:  27.7 mmHg AORTA                    PV Mean grad:  12.0 mmHg Ao Root diam: 3.10 cm   SHUNTS Systemic VTI:  0.17 m Systemic Diam: 1.80 cm Laurance FlattenHeather Pemberton MD Electronically signed by Laurance FlattenHeather Pemberton MD Signature Date/Time: 04/06/2021/6:53:25 PM    Final (Updated)     Impression:  1.  Nausea.  CHF vs PUD/GERD vs acute viral syndrome vs other. 2.  Anemia, mild (unless given transfusion yesterday with Hgb 8.8, I suspect the 8.8 value yesterday is spurious as her Hgb today is 11.5, closer to her baseline).  No overt GI bleeding. 3.  Shortness of breath.  With bilateral pleural effusions and elevated BNP, suspect at least component of heart failure. 4.  Abdominal pain.  Non-contrasted CT scan without obvious pathology.  Plan:  1.  Supportive symptom-based treatment of nausea and abdominal discomfort. Change  PPI to IV, and change ondansetron to scheduled (not prn). 2.  There is no GI bleeding and no obvious fulminant process on yesterday's CT; in light of this, as well as the patient's age and comorbidities and her significant shortness of breath (despite Hgb 11.5), I don't think there is any utility in any invasive GI work-up (e.g., EGD) at this time. 3.  Medical/cardiology team management of patient's ongoing shortness of breath. 4.  [facility] will revisit tomorrow.   LOS: 1 day   Preethi Scantlebury M  04/07/2021, 1:10 PM  Cell [phone] If no answer or after 5 PM call [phone]

## 2021-04-07 NOTE — Consult Note (Signed)
[place]  Telephone:([phone] [phone]   Name: [name] Date: 04/07/2021 MRN: [id number]  DOB: [birth year]  Patient Care Team: Mayra Neer, MD as PCP - General (Family Medicine) Josue Hector, MD as PCP - Cardiology (Cardiology)    REASON FOR CONSULTATION: [name] is a 87 y.o. female with multiple medical problems including diastolic dysfunction with history of CHF, hypertension, hypothyroidism, and cystic pancreatic mass.  Patient was admitted to the hospital on 04/06/2021 with shortness of breath secondary to CHF exacerbation.  She was also found to have anemia with initial concern for GI bleed.  Palliative care was consulted up address goals.  SOCIAL HISTORY:     reports that she has never smoked. She has never used smokeless tobacco. She reports that she does not drink alcohol and does not use drugs.  Patient is widowed.  She lives in [facility].  She has no children but raised a niece.  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   ADVANCE DIRECTIVES:  On file  CODE STATUS: DNR  PAST MEDICAL HISTORY: Past Medical History:  Diagnosis Date  . Atrial fibrillation ([place])   . Cholelithiasis   . Constipation   . Diabetes mellitus   . Dizziness    with some gait disability  . GERD (gastroesophageal reflux disease)   . Hepatic cyst   . Hyperlipidemia   . Hypertension   . Ischemic cardiomyopathy   . LBBB (left bundle branch block)   . Macular degeneration   . Other postprocedural status(V45.89)   . Peripheral edema     PAST SURGICAL HISTORY:  Past Surgical History:  Procedure Laterality Date  . ANKLE SURGERY Bilateral    fractures  . CHOLECYSTECTOMY    . Dilation and curettage    . HIP ARTHROPLASTY  05/05/2012   Procedure: ARTHROPLASTY BIPOLAR HIP;  Surgeon: Mauri Pole, MD;  Location: [facility];  Service: Orthopedics;  Laterality: Left;   . TONSILLECTOMY    . WRIST SURGERY Bilateral    wrist fractures    HEMATOLOGY/ONCOLOGY HISTORY:  Oncology History   No history exists.    ALLERGIES:  is allergic to fosamax [alendronate sodium], penicillins, sulfonamide derivatives, and tetanus toxoid.  MEDICATIONS:  Current Facility-Administered Medications  Medication Dose Route Frequency Provider Last Rate Last Admin  . 0.9 %  sodium chloride infusion  250 mL Intravenous PRN Wynetta Fines T, MD 5 mL/hr at 04/06/21 2344 250 mL at 04/06/21 2344  . acetaminophen (TYLENOL) tablet 650 mg  650 mg Oral Q4H PRN Wynetta Fines T, MD      . ALPRAZolam Duanne Moron) tablet 1 mg  1 mg Oral TID PRN Wynetta Fines T, MD      . benzonatate (TESSALON) capsule 100 mg  100 mg Oral TID PRN Wynetta Fines T, MD      . carvedilol (COREG) tablet 6.25 mg  6.25 mg Oral BID Kroeger, Krista M., PA-C   6.25 mg at 04/06/21 2320  . ferrous sulfate tablet 325 mg  325 mg Oral Q breakfast Wynetta Fines T, MD      . guaiFENesin (ROBITUSSIN) 100 MG/5ML solution 200 mg  200 mg Oral Q8H PRN Wynetta Fines T, MD      . hydrALAZINE (APRESOLINE) injection 5 mg  5 mg Intravenous Q6H PRN Wynetta Fines T, MD      . ipratropium-albuterol (DUONEB) 0.5-2.5 (3) MG/3ML nebulizer solution 3 mL  3 mL  Nebulization Q6H Wynetta Fines T, MD   3 mL at 04/07/21 0805  . lactobacillus acidophilus & bulgar (LACTINEX) chewable tablet 1 tablet  1 tablet Oral Daily Wynetta Fines T, MD      . levothyroxine (SYNTHROID) tablet 25 mcg  25 mcg Oral QAC breakfast Lequita Halt, MD   25 mcg at 04/07/21 [phone]  . losartan (COZAAR) tablet 25 mg  25 mg Oral Daily Roby Lofts M., PA-C   25 mg at 04/06/21 2325  . magnesium hydroxide (MILK OF MAGNESIA) suspension 15 mL  15 mL Oral Daily PRN Wynetta Fines T, MD      . mirtazapine (REMERON) tablet 30 mg  30 mg Oral QHS Wynetta Fines T, MD   30 mg at 04/06/21 2320  . ondansetron (ZOFRAN) injection 4 mg  4 mg Intravenous Q6H Arta Silence, MD      . pantoprazole (PROTONIX) injection 40  mg  40 mg Intravenous Q12H Arta Silence, MD      . predniSONE (DELTASONE) tablet 20 mg  20 mg Oral Q breakfast Wynetta Fines T, MD      . sertraline (ZOLOFT) tablet 100 mg  100 mg Oral Daily Wynetta Fines T, MD      . simvastatin (ZOCOR) tablet 40 mg  40 mg Oral Daily Wynetta Fines T, MD      . sodium chloride flush (NS) 0.9 % injection 3 mL  3 mL Intravenous Q12H Wynetta Fines T, MD   3 mL at 04/06/21 2321  . sodium chloride flush (NS) 0.9 % injection 3 mL  3 mL Intravenous PRN Wynetta Fines T, MD      . spironolactone (ALDACTONE) tablet 12.5 mg  12.5 mg Oral Daily Wynetta Fines T, MD      . torsemide [facility]) tablet 10 mg  10 mg Oral Daily Geralynn Rile, MD        VITAL SIGNS: BP 139/74 (BP Location: Right Arm)   Pulse 81   Temp 97.9 F (36.6 C) (Oral)   Resp 18   Ht 6' (1.829 m)   Wt 197 lb 1.5 oz (89.4 kg)   SpO2 94%   BMI 26.73 kg/m  Filed Weights   04/06/21 0942 04/07/21 0426  Weight: 220 lb 0.3 oz (99.8 kg) 197 lb 1.5 oz (89.4 kg)    Estimated body mass index is 26.73 kg/m as calculated from the following:   Height as of this encounter: 6' (1.829 m).   Weight as of this encounter: 197 lb 1.5 oz (89.4 kg).  LABS: CBC:    Component Value Date/Time   WBC 8.7 04/07/2021 0516   HGB 11.5 (L) 04/07/2021 0516   HCT 35.7 (L) 04/07/2021 0516   PLT 174 04/07/2021 0516   MCV 74.7 (L) 04/07/2021 0516   NEUTROABS 6.4 04/07/2021 0516   LYMPHSABS 1.3 04/07/2021 0516   MONOABS 1.0 04/07/2021 0516   EOSABS 0.0 04/07/2021 0516   BASOSABS 0.0 04/07/2021 0516   Comprehensive Metabolic Panel:    Component Value Date/Time   NA 142 04/07/2021 0516   K 3.6 04/07/2021 0516   CL 103 04/07/2021 0516   CO2 27 04/07/2021 0516   BUN 18 04/07/2021 0516   CREATININE 1.25 (H) 04/07/2021 0516   GLUCOSE 129 (H) 04/07/2021 0516   CALCIUM 9.7 04/07/2021 0516   AST 23 07/02/2019 1917   ALT 18 07/02/2019 1917   ALKPHOS 73 07/02/2019 1917   BILITOT 0.5 07/02/2019 1917   PROT 7.3 07/02/2019  1917  ALBUMIN 3.7 07/02/2019 1917    RADIOGRAPHIC STUDIES: CT ABDOMEN PELVIS WO CONTRAST  Result Date: 04/06/2021 CLINICAL DATA:  Abdominal pain EXAM: CT ABDOMEN AND PELVIS WITHOUT CONTRAST TECHNIQUE: Multidetector CT imaging of the abdomen and pelvis was performed following the standard protocol without IV contrast. COMPARISON:  01/28/2018 FINDINGS: Lower chest: Lung bases demonstrate mild pleural effusions bilaterally right slightly greater than left. Some mild associated atelectasis is noted. Hepatobiliary: No focal liver abnormality is seen. Status post cholecystectomy. No biliary dilatation. Pancreas: Fatty interdigitation of the pancreas is noted. Previously seen cystic lesions are again identified and stable. Spleen: Normal in size without focal abnormality. Adrenals/Urinary Tract: Adrenal glands are unremarkable. Kidneys are well visualized bilaterally without renal calculi or obstructive changes. The ureters show no renal calculi. Multiple cysts are noted within the right kidney similar to that seen on the prior exam. The largest of these is noted anteriorly measuring approximately 5.3 cm. Bladder is well distended. Stomach/Bowel: Scattered diverticular change of the colon is noted. No evidence of diverticulitis is seen. The appendix is not well visualized. No inflammatory changes to suggest appendicitis are noted. Small bowel and stomach appear within normal limits. Vascular/Lymphatic: Atherosclerotic calcifications of the abdominal aorta are seen. No aneurysmal dilatation is noted. No sizable adenopathy is seen. Reproductive: Uterus and bilateral adnexa are unremarkable. Other: No abdominal wall hernia or abnormality. No abdominopelvic ascites. Musculoskeletal: Bilateral hip replacements are seen. Degenerative changes of lumbar spine are noted. No other focal abnormality is seen. IMPRESSION: Small bilateral pleural effusions right greater than left with associated atelectatic changes. Stable  cystic lesions within the pancreas. Given their long-term stability they are felt to be benign in etiology. Diverticulosis without diverticulitis. Electronically Signed   By: Inez Catalina M.D.   On: 04/06/2021 15:45   DG CHEST PORT 1 VIEW  Result Date: 04/07/2021 CLINICAL DATA:  Aspiration into airway. EXAM: PORTABLE CHEST 1 VIEW COMPARISON:  Apr 06, 2021 FINDINGS: There is hazy opacity in the right base. There may be tiny bilateral effusions. Minimal atelectasis in the left base. The cardiomediastinal silhouette is stable. No pneumothorax. No other abnormalities. IMPRESSION: 1. Hazy opacity in the right base could represent effusion and underlying atelectasis, developing infiltrate such as pneumonia, or aspiration. 2. Possible tiny left effusion with underlying atelectasis. Electronically Signed   By: Dorise Bullion III M.D   On: 04/07/2021 10:35   DG Chest Portable 1 View  Result Date: 04/06/2021 CLINICAL DATA:  Shortness of breath, cough EXAM: PORTABLE CHEST 1 VIEW COMPARISON:  10/19/2020 FINDINGS: Heart is normal size. Femoral heel thickening and increased interstitial markings particularly in the lung bases. No effusions. No acute bony abnormality. IMPRESSION: Peribronchial thickening and interstitial prominence in the lower lobes could reflect bronchitis or viral/atypical pneumonia. Less likely edema given the normal heart size. Electronically Signed   By: Rolm Baptise M.D.   On: 04/06/2021 10:52   ECHOCARDIOGRAM COMPLETE  Result Date: 04/06/2021    ECHOCARDIOGRAM REPORT   Patient Name:   KASHANA BREACH Date of Exam: 04/06/2021 Medical Rec #:  [id number]            Height:       70.0 in Accession #:    [id number]           Weight:       220.0 lb Date of Birth:  08/10/1923            BSA:          2.174 m Patient Age:  89 years             BP:           155/90 mmHg Patient Gender: F                    HR:           95 bpm. Exam Location:  Inpatient Procedure: 2D Echo, Cardiac Doppler and  Color Doppler Indications:     P54.65 Acute diastolic (congestive) heart failure  History:         Patient has prior history of Echocardiogram examinations, most                  recent 04/14/2016. 87 year-old female. nausea.  Sonographer:     Merrie Roof Referring Phys:  [id number] Lequita Halt Diagnosing Phys: Gwyndolyn Kaufman MD IMPRESSIONS  1. Left ventricular ejection fraction, by estimation, is 30 to 35%. The left ventricle has moderately decreased function. Wall motion difficult to assess due to lack of definity contrast and incomplete visualization of the LV endocardium. Based on limited views, all mid-to-apical septal, apical inferior, mid-to-apcial anterior, and apex appear akinetic. Findings concerning for possible LAD disease. There is moderate asymmetric hypertrophy of the basal septum. The rest of the LV segments demonstrate mild concentric left ventricular hypertrophy. Indeterminate diastolic filling due to E-A fusion.  2. Right ventricular systolic function is normal. The right ventricular size is normal.  3. The mitral valve is grossly normal. There is mild thickening of the mitral valve leaflet(s). There is mild calcification of the mitral valve leaflet(s). Mild to moderate mitral annular calcification. Trivial mitral valve regurgitation. No evidence of  mitral stenosis.  4. The aortic valve was not well visualized. Aortic valve regurgitation is not visualized.  5. There is flow acceleration noted at the level of the pulmonic valve (poor visualization of the valve) with mean gradient 27mHg, peak gradient 224mg, Vmax 2.6377m Either mild PS or flow acceleration due to vigorous RV systolic function. Comparison(s): Compared to prior echo report, the LVEF is now moderately reduced to 30-35% (previously 60-65%) with wall motion abnormalities as detailed above. FINDINGS  Left Ventricle: Left ventricular ejection fraction, by estimation, is 30 to 35%. The left ventricle has moderately decreased function.  Wall motion difficult to assess due to lack of definity contrast and incomplete visualization of the LV endocardium. Based on limited views, all mid-to-apical septal, apical inferior, mid-to-apcial anterior, and apex appear akinetic. Findings consistent with LAD territory. The left ventricular internal cavity size was normal in size. There is moderate asymmetric hypertrophy of the basal septum. The rest of the LV segments demonstrate mild concentric left ventricular hypertrophy. Indeterminate diastolic filling due to E-A fusion. Right Ventricle: The right ventricular size is normal. No increase in right ventricular wall thickness. Right ventricular systolic function is normal. Left Atrium: Left atrial size was normal in size. Right Atrium: Right atrial size was normal in size. Pericardium: There is no evidence of pericardial effusion. Mitral Valve: The mitral valve is grossly normal. There is mild thickening of the mitral valve leaflet(s). There is mild calcification of the mitral valve leaflet(s). Mild to moderate mitral annular calcification. Trivial mitral valve regurgitation. No evidence of mitral valve stenosis. Tricuspid Valve: The tricuspid valve is normal in structure. Tricuspid valve regurgitation is trivial. Aortic Valve: The aortic valve was not well visualized. Aortic valve regurgitation is not visualized. Pulmonic Valve: The pulmonic valve was not well visualized. Pulmonic valve regurgitation is trivial. Aorta: The  aortic root is normal in size and structure. Pulmonary Artery: There is flow acceleration noted at the level of the pulmonic valve (poor visualization of the valve) with mean gradient 79mHg, peak gradient 284mg, Vmax 2.6359m Either mild PS or flow acceleration due to vigorous RV systolic function. IAS/Shunts: No atrial level shunt detected by color flow Doppler.  LEFT VENTRICLE PLAX 2D LVIDd:         3.20 cm LVIDs:         2.50 cm LV PW:         1.40 cm LV IVS:        1.50 cm LVOT diam:      1.80 cm LV SV:         44 LV SV Index:   20 LVOT Area:     2.54 cm  LV Volumes (MOD) LV vol d, MOD A4C: 61.6 ml LV vol s, MOD A4C: 42.6 ml LV SV MOD A4C:     61.6 ml LEFT ATRIUM             Index LA diam:        3.25 cm 1.50 cm/m LA Vol (A2C):   43.9 ml 20.20 ml/m LA Vol (A4C):   51.0 ml 23.46 ml/m LA Biplane Vol: 46.8 ml 21.53 ml/m  AORTIC VALVE             PULMONIC VALVE LVOT Vmax:   94.60 cm/s  PV Vmax:       2.63 m/s LVOT Vmean:  60.400 cm/s PV Vmean:      158.000 cm/s LVOT VTI:    0.172 m     PV VTI:        0.421 m                          PV Peak grad:  27.7 mmHg AORTA                    PV Mean grad:  12.0 mmHg Ao Root diam: 3.10 cm   SHUNTS Systemic VTI:  0.17 m Systemic Diam: 1.80 cm HeaGwyndolyn Kaufman Electronically signed by HeaGwyndolyn Kaufman Signature Date/Time: 04/06/2021/6:53:25 PM    Final (Updated)     PERFORMANCE STATUS (ECOG) : 3 - Symptomatic, >50% confined to bed  Review of Systems Unless otherwise noted, a complete review of systems is negative.  Physical Exam General: NAD, frail appearing Pulmonary: Unlabored Extremities: no edema, no joint deformities Skin: no rashes Neurological: Weakness but otherwise nonfocal  IMPRESSION: I met with patient and her great niece (KaJuliann Pulseo discuss goals.  However, patient with deferential to her niece as she has been symptomatic today with nausea and diarrhea.  KatJuliann Pulseports that her mother, EstCleone Slimas raised by patient and is patient's healthcare power of attorney.  However, EstCleone Slims just hospitalized with pneumonia and is at home recuperating.  Per KatJuliann Pulsestelle's involvement in healthcare decision-making will be limited during this hospitalization.  KatJuliann Pulsells me that patient has slowly declined at ALF.  At baseline, patient is mostly chair bound and requires assistance with ADLs.  Family recognize that patient may not return to her previous baseline.  They are looking at possible SNF placement.  Family also recognizes  that this hospitalization could herald further decline.  Patient would be clinically and prognostically appropriate for hospice involvement.  KatJuliann Pulseought that hospice would be a good idea, although patient's final disposition is unclear at the present  time.  We will consult TOC to coordinate.  Symptomatically, agree with scheduled ondansetron for nausea.  Consider low-dose prochlorperazine if ondansetron is ineffective.  Also consider adding antidiarrheals.  PLAN: -Continue current scope of treatment -Family interested in hospice involvement at ALF or SNF -Consult TOC -DNR/DNI   Time Total: 60 minutes  Visit consisted of counseling and education dealing with the complex and emotionally intense issues of symptom management and palliative care in the setting of serious and potentially life-threatening illness.Greater than 50%  of this time was spent counseling and coordinating care related to the above assessment and plan.  Signed by: Altha Harm, PhD, NP-C

## 2021-04-08 DIAGNOSIS — I5043 Acute on chronic combined systolic (congestive) and diastolic (congestive) heart failure: Secondary | ICD-10-CM | POA: Diagnosis not present

## 2021-04-08 LAB — TYPE AND SCREEN
ABO/RH(D): O POS
Antibody Screen: NEGATIVE
Unit division: 0

## 2021-04-08 LAB — CBC
HCT: 36.9 % (ref 36.0–46.0)
Hemoglobin: 11.5 g/dL — ABNORMAL LOW (ref 12.0–15.0)
MCH: 23.8 pg — ABNORMAL LOW (ref 26.0–34.0)
MCHC: 31.2 g/dL (ref 30.0–36.0)
MCV: 76.2 fL — ABNORMAL LOW (ref 80.0–100.0)
Platelets: 193 10*3/uL (ref 150–400)
RBC: 4.84 MIL/uL (ref 3.87–5.11)
RDW: 17.6 % — ABNORMAL HIGH (ref 11.5–15.5)
WBC: 10.1 10*3/uL (ref 4.0–10.5)
nRBC: 0 % (ref 0.0–0.2)

## 2021-04-08 LAB — BASIC METABOLIC PANEL
Anion gap: 15 (ref 5–15)
BUN: 24 mg/dL — ABNORMAL HIGH (ref 8–23)
CO2: 24 mmol/L (ref 22–32)
Calcium: 9.2 mg/dL (ref 8.9–10.3)
Chloride: 103 mmol/L (ref 98–111)
Creatinine, Ser: 1.19 mg/dL — ABNORMAL HIGH (ref 0.44–1.00)
GFR, Estimated: 42 mL/min — ABNORMAL LOW (ref >60)
Glucose, Bld: 138 mg/dL — ABNORMAL HIGH (ref 70–99)
Potassium: 3.6 mmol/L (ref 3.5–5.1)
Sodium: 142 mmol/L (ref 135–145)

## 2021-04-08 LAB — BPAM RBC
Blood Product Expiration Date: 202206162359
ISSUE DATE / TIME: 202205210007
Unit Type and Rh: 5100

## 2021-04-08 LAB — MAGNESIUM: Magnesium: 2.2 mg/dL (ref 1.7–2.4)

## 2021-04-08 MED ORDER — IPRATROPIUM-ALBUTEROL 0.5-2.5 (3) MG/3ML IN SOLN: 3.0000 mL | Freq: Four times a day (QID) | RESPIRATORY_TRACT | Status: DC | PRN

## 2021-04-08 MED ORDER — GUAIFENESIN ER 600 MG PO TB12
600.0000 mg | ORAL_TABLET | Freq: Two times a day (BID) | ORAL | Status: DC | PRN
  Administered 2021-04-08: 600 mg via ORAL
  Filled 2021-04-08: qty 1

## 2021-04-08 NOTE — NC FL2 (Signed)
[place] MEDICAID FL2 LEVEL OF CARE SCREENING TOOL     IDENTIFICATION  Patient Name: [name] Birthdate: 25-Oct-1923 Sex: female Admission Date (Current Location): 04/06/2021  [facility] and [place] Number:  Producer, television/film/video and Address:  [facility], [address], [place], [place] [address]      Provider Number: [id number]  Attending Physician Name and Address:  Noralee Stain, DO  Relative Name and Phone Number:  Stefano Gaul [phone]    Current Level of Care: Hospital Recommended Level of Care: Skilled Nursing Facility Prior Approval Number:    Date Approved/Denied:   PASRR Number: [id number]  Discharge Plan: SNF    Current Diagnoses: Patient Active Problem List   Diagnosis Date Noted  . Microcytic anemia 04/07/2021  . Iron deficiency anemia 04/07/2021  . CKD (chronic kidney disease) stage 3, GFR 30-59 ml/min (HCC) 04/07/2021  . Pancreas cyst 04/07/2021  . Hypothyroid 04/07/2021  . Aspiration into airway 04/07/2021  . Palliative care encounter   . Reactive airway disease 12/09/2017  . Anxiety and depression 12/09/2017  . Acute respiratory failure (HCC) 12/09/2017  . Normocytic anemia 04/13/2016  . Acute on chronic combined systolic and diastolic CHF (congestive heart failure) (HCC) 04/13/2016  . Pyelonephritis 04/12/2016  . Chronic combined systolic and diastolic congestive heart failure (HCC) 11/14/2013  . Constipation 11/14/2013  . Nausea 11/14/2013  . Influenza with respiratory manifestations 11/09/2013  . AKI (acute kidney injury) (HCC) 11/09/2013  . Influenza 11/07/2013  . Acute on chronic combined systolic and diastolic congestive heart failure (HCC) 11/07/2013  . Acute respiratory failure with hypoxia (HCC) 11/07/2013  . Fever 11/07/2013  . Generalized weakness 11/07/2013  . Peripheral neuropathy 05/05/2012  . Closed left hip fracture (HCC) 05/04/2012  . Chronic systolic HF (heart failure) (HCC) 05/04/2012  .  Arthritis 07/23/2011  . DYSPNEA 07/31/2010  . DEPRESSIVE DISORDER NOT ELSEWHERE CLASSIFIED 02/08/2009  . Type 2 diabetes mellitus with diabetic neuropathy, without long-term current use of insulin (HCC) 11/08/2008  . HLD (hyperlipidemia) 11/08/2008  . Essential hypertension 11/08/2008  . LBBB (left bundle branch block) 11/08/2008  . Atrial fibrillation (HCC) 11/08/2008  . GERD 11/08/2008  . HEPATIC CYST 11/08/2008  . CHOLELITHIASIS 11/08/2008  . DILATION AND CURETTAGE, HX OF 11/08/2008    Orientation RESPIRATION BLADDER Height & Weight     Self,Time,Situation,Place  O2 (2L) Incontinent,External catheter Weight: 190 lb 0.6 oz (86.2 kg) Height:  6' (182.9 cm)  BEHAVIORAL SYMPTOMS/MOOD NEUROLOGICAL BOWEL NUTRITION STATUS      Continent Diet (See discharge summary)  AMBULATORY STATUS COMMUNICATION OF NEEDS Skin   Limited Assist Verbally Other (Comment) (ecchymosis)                       Personal Care Assistance Level of Assistance  Bathing,Feeding,Dressing Bathing Assistance: Limited assistance Feeding assistance: Independent Dressing Assistance: Limited assistance     Functional Limitations Info  Sight,Hearing,Speech Sight Info: Impaired Hearing Info: Impaired Speech Info: Adequate    SPECIAL CARE FACTORS FREQUENCY  PT (By licensed PT),OT (By licensed OT)     PT Frequency: 5x per week OT Frequency: 5x per week            Contractures Contractures Info: Not present    Additional Factors Info  Code Status,Allergies Code Status Info: DNR Allergies Info: Fosamax (Alendronate Sodium), Penicillins, Sulfonamide Derivatives, Tetanus Toxoid           Current Medications (04/08/2021):  This is the current hospital active  medication list Current Facility-Administered Medications  Medication Dose Route Frequency Provider Last Rate Last Admin  . 0.9 %  sodium chloride infusion  250 mL Intravenous PRN Mikey College T, MD 5 mL/hr at 04/06/21 2344 250 mL at 04/06/21 2344   . acetaminophen (TYLENOL) tablet 650 mg  650 mg Oral Q4H PRN Mikey College T, MD      . ALPRAZolam Prudy Feeler) tablet 1 mg  1 mg Oral TID PRN Emeline General, MD   1 mg at 04/08/21 5638  . benzonatate (TESSALON) capsule 100 mg  100 mg Oral TID PRN Mikey College T, MD      . carvedilol (COREG) tablet 6.25 mg  6.25 mg Oral BID Judy Pimple M., PA-C   6.25 mg at 04/08/21 7564  . ferrous sulfate tablet 325 mg  325 mg Oral Q breakfast Mikey College T, MD   325 mg at 04/08/21 3329  . guaiFENesin (MUCINEX) 12 hr tablet 600 mg  600 mg Oral BID PRN Noralee Stain, DO   600 mg at 04/08/21 1209  . hydrALAZINE (APRESOLINE) injection 5 mg  5 mg Intravenous Q6H PRN Mikey College T, MD      . ipratropium-albuterol (DUONEB) 0.5-2.5 (3) MG/3ML nebulizer solution 3 mL  3 mL Nebulization Q6H PRN Noralee Stain, DO      . lactobacillus acidophilus & bulgar (LACTINEX) chewable tablet 1 tablet  1 tablet Oral Daily Emeline General, MD   1 tablet at 04/08/21 1209  . levothyroxine (SYNTHROID) tablet 25 mcg  25 mcg Oral QAC breakfast Emeline General, MD   25 mcg at 04/08/21 [phone]  . losartan (COZAAR) tablet 25 mg  25 mg Oral Daily Judy Pimple M., PA-C   25 mg at 04/08/21 4166  . magnesium hydroxide (MILK OF MAGNESIA) suspension 15 mL  15 mL Oral Daily PRN Mikey College T, MD      . mirtazapine (REMERON) tablet 30 mg  30 mg Oral QHS Mikey College T, MD   30 mg at 04/07/21 2137  . pantoprazole (PROTONIX) injection 40 mg  40 mg Intravenous Adalberto Ill, MD   40 mg at 04/08/21 0936  . potassium chloride SA (KLOR-CON) CR tablet 40 mEq  40 mEq Oral Once Noralee Stain, DO      . predniSONE (DELTASONE) tablet 20 mg  20 mg Oral Q breakfast Mikey College T, MD   20 mg at 04/08/21 0630  . prochlorperazine (COMPAZINE) injection 10 mg  10 mg Intravenous Q4H PRN Noralee Stain, DO   10 mg at 04/07/21 1802  . sertraline (ZOLOFT) tablet 100 mg  100 mg Oral Daily Mikey College T, MD   100 mg at 04/08/21 1601  . simvastatin (ZOCOR) tablet 40 mg  40  mg Oral Daily Mikey College T, MD   40 mg at 04/08/21 0932  . sodium chloride flush (NS) 0.9 % injection 3 mL  3 mL Intravenous Q12H Mikey College T, MD   3 mL at 04/07/21 2138  . sodium chloride flush (NS) 0.9 % injection 3 mL  3 mL Intravenous PRN Mikey College T, MD      . spironolactone (ALDACTONE) tablet 12.5 mg  12.5 mg Oral Daily Mikey College T, MD   12.5 mg at 04/08/21 3557  . torsemide (DEMADEX) tablet 10 mg  10 mg Oral Daily O'Neal, Ronnald Ramp, MD   10 mg at 04/08/21 3220     Discharge Medications: Please see discharge summary for a list of discharge medications.  Relevant  Imaging Results:  Relevant Lab Results:   Additional Information SSN# [id number] pt has been vaccinated and one booster  Patrice Paradise, LCSW

## 2021-04-08 NOTE — Evaluation (Signed)
Physical Therapy Evaluation Patient Details Name: [name] MRN: [id number] DOB: 31-Aug-1923 Today's Date: 04/08/2021   History of Present Illness  87 yo admitted 5/20 with SOB and CHF exacerbation. PMHx: diastolic dysfunction with history of CHF, hypertension, hypothyroidism, LBBB, macular degeneration, and cystic pancreatic mass  Clinical Impression  Pt pleasant and eager to get OOB. Pt with impaired balance, strength, cognition, gait and transfers who requires physical assist for all transfers and gait to prevent falls. Pt does endorse history of falls and that she normally gets up in her apartment on her own and has assist for bathing/dressing. Pt does not have sufficient assist at ALF currently to assist with all transfers and pt would benefit from increased level of care given decline in balance and function. Pt with above and below deficits who will benefit from acute therapy to maximize mobility, safety and independence to decrease fall risk.   SpO2 90-95% on RA HR 78    Follow Up Recommendations SNF;Supervision for mobility/OOB    Equipment Recommendations  Hospital bed;3in1 (PT)    Recommendations for Other Services OT consult     Precautions / Restrictions Precautions Precautions: Fall      Mobility  Bed Mobility Overal bed mobility: Needs Assistance Bed Mobility: Supine to Sit     Supine to sit: Min guard     General bed mobility comments: HOb 15 degrees with guarding for safety. PT with posterior bias EOB with mod cues for posture and position at midline    Transfers Overall transfer level: Needs assistance   Transfers: Sit to/from Stand;Stand Pivot Transfers Sit to Stand: Min assist Stand pivot transfers: Mod assist       General transfer comment: min assist to stand from bed and recliner with cues for hand placement. Mod assist to pivot bed to [facility] with single UE support and max cues with assist to control and pivot  pelvis  Ambulation/Gait Ambulation/Gait assistance: Mod assist Gait Distance (Feet): 90 Feet Assistive device: Rolling walker (2 wheeled) Gait Pattern/deviations: Step-through pattern;Decreased stride length   Gait velocity interpretation: <1.8 ft/sec, indicate of risk for recurrent falls General Gait Details: pt generally min assist for gait with periods of wide BOS and unsteadiness with at least 5 LOB with mod assit to recover and correct. Pt with flexed trunk with mod cues for safety, proximity to RW, hand placement and direction. Pt on RA throughout with SPO2 90-95%  Stairs            Wheelchair Mobility    Modified Rankin (Stroke Patients Only)       Balance Overall balance assessment: Needs assistance Sitting-balance support: No upper extremity supported;Feet supported Sitting balance-Leahy Scale: Fair     Standing balance support: Bilateral upper extremity supported Standing balance-Leahy Scale: Poor Standing balance comment: bil UE support and physical assist for balance with gait                             Pertinent Vitals/Pain Pain Assessment: No/denies pain    Home Living Family/patient expects to be discharged to:: Assisted living               Home Equipment: Walker - 2 wheels;Shower seat      Prior Function Level of Independence: Needs assistance   Gait / Transfers Assistance Needed: walks with RW  ADL's / Homemaking Assistance Needed: assist with bathing and dressing staff does all the homemaking  Hand Dominance        Extremity/Trunk Assessment   Upper Extremity Assessment Upper Extremity Assessment: Generalized weakness    Lower Extremity Assessment Lower Extremity Assessment: Generalized weakness    Cervical / Trunk Assessment Cervical / Trunk Assessment: Kyphotic  Communication   Communication: No difficulties  Cognition Arousal/Alertness: Awake/alert Behavior During Therapy: WFL for tasks  assessed/performed Overall Cognitive Status: Impaired/Different from baseline Area of Impairment: Memory;Attention;Orientation;Problem solving;Safety/judgement                 Orientation Level: Time Current Attention Level: Sustained Memory: Decreased short-term memory   Safety/Judgement: Decreased awareness of safety;Decreased awareness of deficits   Problem Solving: Slow processing;Requires verbal cues;Requires tactile cues General Comments: pt initially stating she gets to the bathroom when she needs to void then later stating she wears depends. Despite need for assist at least 5x during session to prevent fall pt continues to state she would be safe in her apartment to move around alone      General Comments      Exercises     Assessment/Plan    PT Assessment Patient needs continued PT services  PT Problem List Decreased strength;Decreased mobility;Decreased safety awareness;Decreased activity tolerance;Decreased balance;Decreased knowledge of use of DME;Decreased cognition       PT Treatment Interventions Gait training;Balance training;Functional mobility training;Therapeutic activities;Patient/family education;Cognitive remediation;Neuromuscular re-education;DME instruction;Therapeutic exercise    PT Goals (Current goals can be found in the Care Plan section)  Acute Rehab PT Goals Patient Stated Goal: I'd like to return to my apartment PT Goal Formulation: With patient Time For Goal Achievement: 04/22/21 Potential to Achieve Goals: Fair    Frequency Min 3X/week   Barriers to discharge Decreased caregiver support      Co-evaluation               AM-PAC PT "6 Clicks" Mobility  Outcome Measure Help needed turning from your back to your side while in a flat bed without using bedrails?: A Little Help needed moving from lying on your back to sitting on the side of a flat bed without using bedrails?: A Little Help needed moving to and from a bed to a chair  (including a wheelchair)?: A Lot Help needed standing up from a chair using your arms (e.g., wheelchair or bedside chair)?: A Little Help needed to walk in hospital room?: A Lot Help needed climbing 3-5 steps with a railing? : Total 6 Click Score: 14    End of Session Equipment Utilized During Treatment: Gait belt Activity Tolerance: Patient tolerated treatment well Patient left: in chair;with call bell/phone within reach;with chair alarm set Nurse Communication: Mobility status PT Visit Diagnosis: Other abnormalities of gait and mobility (R26.89);Muscle weakness (generalized) (M62.81);Difficulty in walking, not elsewhere classified (R26.2)    Time: 0623-7628 PT Time Calculation (min) (ACUTE ONLY): 26 min   Charges:   PT Evaluation $PT Eval Moderate Complexity: 1 Mod PT Treatments $Gait Training: 8-22 mins        Rhondalyn Clingan P, PT Acute Rehabilitation Services Pager: [phone] Office: [phone]   Enedina Finner Jeannene Tschetter 04/08/2021, 9:16 AM

## 2021-04-08 NOTE — Progress Notes (Signed)
  Speech Language Pathology Treatment: Dysphagia  Patient Details Name: [name] MRN: [id number] DOB: [birth year] Today's Date: 04/08/2021 Time: 4193-7902 SLP Time Calculation (min) (ACUTE ONLY): 20 min  Assessment / Plan / Recommendation Clinical Impression  Prior to session, SLP had secure Epic message chat with patient's attending MD to confirm that patient was cleared for starting clear liquids diet in light of yesterday's nausea. MD cleared patient to initiate diet and did inform SLP that she was hoping for SLP to reassess patient's swallow and advance diet beyond clear liquids.  Upon entering room, patient was sitting upright in recliner and was awake and alert. She reported that she feels so much better today than yesterday and she has not had any nausea today. SLP observed patient with PO intake of thin liquids (water) and regular solids (graham cracker). She did not exhibit any coughing, throat clearing or other overt s/s that could be indicative of aspiration/penetration. Patient did report that she has had a chronic cough with expectoration of mucous that has been going on prior to this admission and which she attributes to allergies.  SLP is recommending to initiate Dys 3 solids, thin liquids diet at this time and ST services will continue to follow patient for diet toleration.   HPI HPI: Pt is a 87 yo female presenting with worsening SOB. CXR on admission with bilateral infiltrates versus edema; repeat imaging 5/21 potentially indicative of aspiration in the R lung base. Pt had an episode of coughing and vomiting that morning. PMH includes: GERD, CHF, HTN, pancreatic mass, LBBB, hypothyroidism      SLP Plan  Continue with current plan of care       Recommendations  Diet recommendations: Dysphagia 3 (mechanical soft);Thin liquid Liquids provided via: Straw;Cup Medication Administration: Whole meds with puree Supervision: Patient able to self feed Compensations:  Minimize environmental distractions;Slow rate;Small sips/bites Postural Changes and/or Swallow Maneuvers: Seated upright 90 degrees;Upright 30-60 min after meal                Follow up Recommendations: Other (comment) (TBD) SLP Visit Diagnosis: Dysphagia, unspecified (R13.10) Plan: Continue with current plan of care       GO               Angela Nevin, MA, CCC-SLP Speech Therapy [facility] Acute Rehab

## 2021-04-08 NOTE — Progress Notes (Signed)
PROGRESS NOTE    [name]  BJY:[id number] DOB: April 21, 1923 DOA: 04/06/2021 PCP: [name], MD     Brief Narrative:  [name] is a 87 y.o. female with medical history significant of chronic diastolic CHF, HTN, pancreatic mass, LBBB, hypothyroidism, presented with worsening of shortness of breath.  Patient is a poor historian, she reported she has been having increasing shortness of breath "for several months", but really became worse for the last 2 weeks.  Last 2 days, she was not able to walk even short distance within the room without feeling shortness of breath.  She has had productive cough with clear phlegm and wheezing, for which nurse practitioner at assisted living has been giving her albuterol inhaler, which she has been using 2-3 times a day with some relief.  Denies any fever chills.  She also complains about intermittent left-sided abdominal pain, and for the last few months she has noticed her stool color has turned darker.  She has chronic constipation has been using milk of magnesium every day.  Patient was admitted to the hospital with CHF exacerbation as well as suspected blood loss anemia.  She required BiPAP initially on admission.  Cardiology as well as GI and palliative care medicine were consulted.  New events last 24 hours / Subjective: Sitting in a recliner, states that she is feeling much better today.  No further nausea, vomiting or abdominal pain.  Denies any melena, hematochezia.  States that she has a lot on her mind relating to her discharge planning.  Assessment & Plan:   Principal Problem:   Acute on chronic combined systolic and diastolic congestive heart failure (HCC) Active Problems:   HLD (hyperlipidemia)   Atrial fibrillation (HCC)   GERD   Anxiety and depression   Microcytic anemia   Iron deficiency anemia   CKD (chronic kidney disease) stage 3, GFR 30-59 ml/min (HCC)   Pancreas cyst   Hypothyroid   Aspiration into  airway   Palliative care encounter   Acute on chronic systolic and diastolic heart failure History of an ICM/dilated cardiomyopathy -BNP 786.3 -Cardiology signed off 5/22 -Continue Coreg, spironolactone, losartan, torsemide -Strict I's and O's, daily weight, fluid restriction diet  Paroxysmal atrial fibrillation -Not on anticoagulation candidate due to GI bleed, fall risk -Continue Coreg  Acute microcytic anemia, iron deficiency anemia -Concern for GI bleeding -Iron supplementation  -GI following -Status post 1 unit packed red blood cells -Hold aspirin  -PPI twice daily -Hemoglobin remains stable  Hyperlipidemia -Continue Zocor  Depression/anxiety -Continue Zoloft, Xanax  CKD stage IIIa -Monitor BMP while on diuretics -Stable  Pancreatic cyst -Likely benign in nature  Hypothyroidism -Continue Synthroid  Aspiration -Chest x-ray 5/21 revealed hazy opacity in the right base -SLP evaluation, recommending dysphagia 3 diet    DVT prophylaxis:  Place and maintain sequential compression device Start: 04/06/21 1402  Code Status:     Code Status Orders  (From admission, onward)         Start     Ordered   04/06/21 1336  Do not attempt resuscitation (DNR)  Continuous       Question Answer Comment  In the event of cardiac or respiratory ARREST Do not call a "code blue"   In the event of cardiac or respiratory ARREST Do not perform Intubation, CPR, defibrillation or ACLS   In the event of cardiac or respiratory ARREST Use medication by any route, position, wound care, and other measures to relive pain and suffering. May use  oxygen, suction and manual treatment of airway obstruction as needed for comfort.   Comments Patient would like to be intubated if respiratory arrest but no treatment if cardiac arrest.      04/06/21 1337        Code Status History    Date Active Date Inactive Code Status Order ID Comments User Context   12/09/2017 1825 12/11/2017 1942 DNR  [id number]  Pieter Partridgehatterjee, Srobona Tublu, MD Inpatient   12/09/2017 1449 12/09/2017 1825 Partial Code [id number]  Pieter Partridgehatterjee, Srobona Tublu, MD ED   04/13/2016 0050 04/17/2016 1806 DNR [id number]  Alberteen Samanford, Christopher P, MD Inpatient   11/07/2013 1659 11/10/2013 1534 Full Code [id number]  Renae FickleShort, Mackenzie, MD Inpatient   05/05/2012 0039 05/07/2012 1456 Full Code [id number]  Terie PurserPurdy, Jayne Parrett, RN Inpatient   Advance Care Planning Activity    Advance Directive Documentation   Flowsheet Row Most Recent Value  Type of Advance Directive Healthcare Power of Attorney, Living will  Pre-existing out of facility DNR order (yellow form or pink MOST form) --  "MOST" Form in Place? --     Family Communication: None at bedside Disposition Plan:  Status is: Inpatient  Remains inpatient appropriate because:Unsafe d/c plan and Inpatient level of care appropriate due to severity of illness   Dispo: The patient is from: ALF              Anticipated d/c is to: SNF              Patient currently is not medically stable to d/c.  Slowly improving.  SNF placement, recommend outpatient palliative care to follow.   Difficult to place patient No   Consultants:   Cardiology  GI  Palliative care medicine  Antimicrobials:  Anti-infectives (From admission, onward)   Start     Dose/Rate Route Frequency Ordered Stop   04/06/21 1415  doxycycline (VIBRA-TABS) tablet 100 mg  Status:  Discontinued        100 mg Oral Every 12 hours 04/06/21 1400 04/06/21 1912   04/06/21 1215  cefTRIAXone (ROCEPHIN) 1 g in sodium chloride 0.9 % 100 mL IVPB        1 g 200 mL/hr over 30 Minutes Intravenous  Once 04/06/21 1208 04/06/21 1311   04/06/21 1215  azithromycin (ZITHROMAX) tablet 500 mg        500 mg Oral  Once 04/06/21 1208 04/06/21 1220       Objective: Vitals:   04/07/21 2335 04/08/21 0313 04/08/21 0909 04/08/21 1233  BP: (!) 147/87 139/71  121/76  Pulse: 90 81  80  Resp: 20 20  18   Temp: 97.7 F (36.5 C) 98.2 F (36.8  C)  98.5 F (36.9 C)  TempSrc: Oral Oral  Oral  SpO2: 97% 96% 90% 96%  Weight:  86.2 kg    Height:        Intake/Output Summary (Last 24 hours) at 04/08/2021 1335 Last data filed at 04/08/2021 1030 Gross per 24 hour  Intake 200 ml  Output 1100 ml  Net -900 ml   Filed Weights   04/06/21 0942 04/07/21 0426 04/08/21 0313  Weight: 99.8 kg 89.4 kg 86.2 kg    Examination: General exam: Appears calm and comfortable  Respiratory system: Clear to auscultation. Respiratory effort normal.  On room air Cardiovascular system: S1 & S2 heard, RRR. No pedal edema. Gastrointestinal system: Abdomen is nondistended, soft and nontender. Normal bowel sounds heard. Central nervous system: Alert and oriented. Non focal exam. Speech clear  Extremities: Symmetric in  appearance bilaterally  Skin: No rashes, lesions or ulcers on exposed skin  Psychiatry: Judgement and insight appear stable. Mood & affect appropriate.     Data Reviewed: I have personally reviewed following labs and imaging studies  CBC: Recent Labs  Lab 04/06/21 1000 04/07/21 0516 04/08/21 0345  WBC 7.8 8.7 10.1  NEUTROABS  --  6.4  --   HGB 8.8* 11.5* 11.5*  HCT 29.3* 35.7* 36.9  MCV 77.3* 74.7* 76.2*  PLT 160 174 193   Basic Metabolic Panel: Recent Labs  Lab 04/06/21 1000 04/07/21 0516 04/08/21 0345  NA 139 142 142  K 4.3 3.6 3.6  CL 108 103 103  CO2 GLUCOSE 148* 129* 138*  BUN 13 18 24*  CREATININE 0.96 1.25* 1.19*  CALCIUM 9.1 9.7 9.2  MG  --   --  2.2   GFR: Estimated Creatinine Clearance: 31.9 mL/min (A) (by C-G formula based on SCr of 1.19 mg/dL (H)). Liver Function Tests: No results for input(s): AST, ALT, ALKPHOS, BILITOT, PROT, ALBUMIN in the last 168 hours. Recent Labs  Lab 04/06/21 2137  LIPASE 21   No results for input(s): AMMONIA in the last 168 hours. Coagulation Profile: No results for input(s): INR, PROTIME in the last 168 hours. Cardiac Enzymes: No results for input(s):  CKTOTAL, CKMB, CKMBINDEX, TROPONINI in the last 168 hours. BNP (last 3 results) No results for input(s): PROBNP in the last 8760 hours. HbA1C: No results for input(s): HGBA1C in the last 72 hours. CBG: No results for input(s): GLUCAP in the last 168 hours. Lipid Profile: No results for input(s): CHOL, HDL, LDLCALC, TRIG, CHOLHDL, LDLDIRECT in the last 72 hours. Thyroid Function Tests: Recent Labs    04/06/21 1620  TSH 1.912   Anemia Panel: Recent Labs    04/06/21 1620  FERRITIN 24  TIBC 377  IRON 24*  RETICCTPCT 1.2   Sepsis Labs: No results for input(s): PROCALCITON, LATICACIDVEN in the last 168 hours.  Recent Results (from the past 240 hour(s))  Resp Panel by RT-PCR (Flu A&B, Covid) Nasopharyngeal Swab     Status: None   Collection Time: 04/06/21 10:47 AM   Specimen: Nasopharyngeal Swab; Nasopharyngeal(NP) swabs in vial transport medium  Result Value Ref Range Status   SARS Coronavirus 2 by RT PCR NEGATIVE NEGATIVE Final    Comment: (NOTE) SARS-CoV-2 target nucleic acids are NOT DETECTED.  The SARS-CoV-2 RNA is generally detectable in upper respiratory specimens during the acute phase of infection. The lowest concentration of SARS-CoV-2 viral copies this assay can detect is 138 copies/mL. A negative result does not preclude SARS-Cov-2 infection and should not be used as the sole basis for treatment or other patient management decisions. A negative result may occur with  improper specimen collection/handling, submission of specimen other than nasopharyngeal swab, presence of viral mutation(s) within the areas targeted by this assay, and inadequate number of viral copies(<138 copies/mL). A negative result must be combined with clinical observations, patient history, and epidemiological information. The expected result is Negative.  Fact Sheet for Patients:  BloggerCourse.com  Fact Sheet for Healthcare Providers:   SeriousBroker.it  This test is no t yet approved or cleared by the [place] FDA and  has been authorized for detection and/or diagnosis of SARS-CoV-2 by FDA under an Emergency Use Authorization (EUA). This EUA will remain  in effect (meaning this test can be used) for the duration of the COVID-19 declaration under Section 564(b)(1) of the Act, 21 U.S.C.section 360bbb-3(b)(1), unless the  authorization is terminated  or revoked sooner.       Influenza A by PCR NEGATIVE NEGATIVE Final   Influenza B by PCR NEGATIVE NEGATIVE Final    Comment: (NOTE) The Xpert Xpress SARS-CoV-2/FLU/RSV plus assay is intended as an aid in the diagnosis of influenza from Nasopharyngeal swab specimens and should not be used as a sole basis for treatment. Nasal washings and aspirates are unacceptable for Xpert Xpress SARS-CoV-2/FLU/RSV testing.  Fact Sheet for Patients: BloggerCourse.com  Fact Sheet for Healthcare Providers: SeriousBroker.it  This test is not yet approved or cleared by the [place] FDA and has been authorized for detection and/or diagnosis of SARS-CoV-2 by FDA under an Emergency Use Authorization (EUA). This EUA will remain in effect (meaning this test can be used) for the duration of the COVID-19 declaration under Section 564(b)(1) of the Act, 21 U.S.C. section 360bbb-3(b)(1), unless the authorization is terminated or revoked.  Performed at [facility], [address], [place], [place] [address]   MRSA PCR Screening     Status: None   Collection Time: 04/06/21 10:03 PM   Specimen: Nasal Mucosa; Nasopharyngeal  Result Value Ref Range Status   MRSA by PCR NEGATIVE NEGATIVE Final    Comment:        The GeneXpert MRSA Assay (FDA approved for NASAL specimens only), is one component of a comprehensive MRSA colonization surveillance program. It is not intended to diagnose MRSA infection nor  to guide or monitor treatment for MRSA infections. Performed at [facility], [address], [place], [place] [address]       Radiology Studies: CT ABDOMEN PELVIS WO CONTRAST  Result Date: 04/06/2021 CLINICAL DATA:  Abdominal pain EXAM: CT ABDOMEN AND PELVIS WITHOUT CONTRAST TECHNIQUE: Multidetector CT imaging of the abdomen and pelvis was performed following the standard protocol without IV contrast. COMPARISON:  01/28/2018 FINDINGS: Lower chest: Lung bases demonstrate mild pleural effusions bilaterally right slightly greater than left. Some mild associated atelectasis is noted. Hepatobiliary: No focal liver abnormality is seen. Status post cholecystectomy. No biliary dilatation. Pancreas: Fatty interdigitation of the pancreas is noted. Previously seen cystic lesions are again identified and stable. Spleen: Normal in size without focal abnormality. Adrenals/Urinary Tract: Adrenal glands are unremarkable. Kidneys are well visualized bilaterally without renal calculi or obstructive changes. The ureters show no renal calculi. Multiple cysts are noted within the right kidney similar to that seen on the prior exam. The largest of these is noted anteriorly measuring approximately 5.3 cm. Bladder is well distended. Stomach/Bowel: Scattered diverticular change of the colon is noted. No evidence of diverticulitis is seen. The appendix is not well visualized. No inflammatory changes to suggest appendicitis are noted. Small bowel and stomach appear within normal limits. Vascular/Lymphatic: Atherosclerotic calcifications of the abdominal aorta are seen. No aneurysmal dilatation is noted. No sizable adenopathy is seen. Reproductive: Uterus and bilateral adnexa are unremarkable. Other: No abdominal wall hernia or abnormality. No abdominopelvic ascites. Musculoskeletal: Bilateral hip replacements are seen. Degenerative changes of lumbar spine are noted. No other focal abnormality is seen. IMPRESSION: Small  bilateral pleural effusions right greater than left with associated atelectatic changes. Stable cystic lesions within the pancreas. Given their long-term stability they are felt to be benign in etiology. Diverticulosis without diverticulitis. Electronically Signed   By: Alcide Clever M.D.   On: 04/06/2021 15:45   DG Abd 1 View  Result Date: 04/07/2021 CLINICAL DATA:  Left upper quadrant pain EXAM: ABDOMEN - 1 VIEW COMPARISON:  10/19/2020 FINDINGS:  Prior CABG. Nonobstructed bowel gas pattern. No organomegaly, free air or suspicious calcification. Bilateral hip replacements. IMPRESSION: No acute findings. Electronically Signed   By: Charlett Nose M.D.   On: 04/07/2021 14:28   DG CHEST PORT 1 VIEW  Result Date: 04/07/2021 CLINICAL DATA:  Aspiration into airway. EXAM: PORTABLE CHEST 1 VIEW COMPARISON:  Apr 06, 2021 FINDINGS: There is hazy opacity in the right base. There may be tiny bilateral effusions. Minimal atelectasis in the left base. The cardiomediastinal silhouette is stable. No pneumothorax. No other abnormalities. IMPRESSION: 1. Hazy opacity in the right base could represent effusion and underlying atelectasis, developing infiltrate such as pneumonia, or aspiration. 2. Possible tiny left effusion with underlying atelectasis. Electronically Signed   By: Gerome Sam III M.D   On: 04/07/2021 10:35   ECHOCARDIOGRAM COMPLETE  Result Date: 04/06/2021    ECHOCARDIOGRAM REPORT   Patient Name:   DELESIA MARTINEK Date of Exam: 04/06/2021 Medical Rec #:  [id number]            Height:       70.0 in Accession #:    [id number]           Weight:       220.0 lb Date of Birth:  22-Oct-1923            BSA:          2.174 m Patient Age:    89 years             BP:           155/90 mmHg Patient Gender: F                    HR:           95 bpm. Exam Location:  Inpatient Procedure: 2D Echo, Cardiac Doppler and Color Doppler Indications:     I50.31 Acute diastolic (congestive) heart failure  History:         Patient  has prior history of Echocardiogram examinations, most                  recent 04/14/2016. 87 year-old female. nausea.  Sonographer:     Roosvelt Maser Referring Phys:  [id number] Emeline General Diagnosing Phys: Laurance Flatten MD IMPRESSIONS  1. Left ventricular ejection fraction, by estimation, is 30 to 35%. The left ventricle has moderately decreased function. Wall motion difficult to assess due to lack of definity contrast and incomplete visualization of the LV endocardium. Based on limited views, all mid-to-apical septal, apical inferior, mid-to-apcial anterior, and apex appear akinetic. Findings concerning for possible LAD disease. There is moderate asymmetric hypertrophy of the basal septum. The rest of the LV segments demonstrate mild concentric left ventricular hypertrophy. Indeterminate diastolic filling due to E-A fusion.  2. Right ventricular systolic function is normal. The right ventricular size is normal.  3. The mitral valve is grossly normal. There is mild thickening of the mitral valve leaflet(s). There is mild calcification of the mitral valve leaflet(s). Mild to moderate mitral annular calcification. Trivial mitral valve regurgitation. No evidence of  mitral stenosis.  4. The aortic valve was not well visualized. Aortic valve regurgitation is not visualized.  5. There is flow acceleration noted at the level of the pulmonic valve (poor visualization of the valve) with mean gradient , peak gradient , Vmax 2.63m/s. Either mild PS or flow acceleration due to vigorous RV systolic function. Comparison(s): Compared to prior echo report, the LVEF is now  moderately reduced to 30-35% (previously 60-65%) with wall motion abnormalities as detailed above. FINDINGS  Left Ventricle: Left ventricular ejection fraction, by estimation, is 30 to 35%. The left ventricle has moderately decreased function. Wall motion difficult to assess due to lack of definity contrast and incomplete visualization of the LV  endocardium. Based on limited views, all mid-to-apical septal, apical inferior, mid-to-apcial anterior, and apex appear akinetic. Findings consistent with LAD territory. The left ventricular internal cavity size was normal in size. There is moderate asymmetric hypertrophy of the basal septum. The rest of the LV segments demonstrate mild concentric left ventricular hypertrophy. Indeterminate diastolic filling due to E-A fusion. Right Ventricle: The right ventricular size is normal. No increase in right ventricular wall thickness. Right ventricular systolic function is normal. Left Atrium: Left atrial size was normal in size. Right Atrium: Right atrial size was normal in size. Pericardium: There is no evidence of pericardial effusion. Mitral Valve: The mitral valve is grossly normal. There is mild thickening of the mitral valve leaflet(s). There is mild calcification of the mitral valve leaflet(s). Mild to moderate mitral annular calcification. Trivial mitral valve regurgitation. No evidence of mitral valve stenosis. Tricuspid Valve: The tricuspid valve is normal in structure. Tricuspid valve regurgitation is trivial. Aortic Valve: The aortic valve was not well visualized. Aortic valve regurgitation is not visualized. Pulmonic Valve: The pulmonic valve was not well visualized. Pulmonic valve regurgitation is trivial. Aorta: The aortic root is normal in size and structure. Pulmonary Artery: There is flow acceleration noted at the level of the pulmonic valve (poor visualization of the valve) with mean gradient , peak gradient , Vmax 2.91m/s. Either mild PS or flow acceleration due to vigorous RV systolic function. IAS/Shunts: No atrial level shunt detected by color flow Doppler.  LEFT VENTRICLE PLAX 2D LVIDd:         3.20 cm LVIDs:         2.50 cm LV PW:         1.40 cm LV IVS:        1.50 cm LVOT diam:     1.80 cm LV SV:         44 LV SV Index:   20 LVOT Area:     2.54 cm  LV Volumes (MOD) LV vol d, MOD  A4C: 61.6 ml LV vol s, MOD A4C: 42.6 ml LV SV MOD A4C:     61.6 ml LEFT ATRIUM             Index LA diam:        3.25 cm 1.50 cm/m LA Vol (A2C):   43.9 ml 20.20 ml/m LA Vol (A4C):   51.0 ml 23.46 ml/m LA Biplane Vol: 46.8 ml 21.53 ml/m  AORTIC VALVE             PULMONIC VALVE LVOT Vmax:   94.60 cm/s  PV Vmax:       2.63 m/s LVOT Vmean:  60.400 cm/s PV Vmean:      158.000 cm/s LVOT VTI:    0.172 m     PV VTI:        0.421 m                          PV Peak grad:  27.7 mmHg AORTA                    PV Mean grad:  12.0 mmHg Ao Root diam: 3.10 cm  SHUNTS Systemic VTI:  0.17 m Systemic Diam: 1.80 cm Laurance Flatten MD Electronically signed by Laurance Flatten MD Signature Date/Time: 04/06/2021/6:53:25 PM    Final (Updated)       Scheduled Meds: . carvedilol  6.25 mg Oral BID  . ferrous sulfate  325 mg Oral Q breakfast  . lactobacillus acidophilus & bulgar  1 tablet Oral Daily  . levothyroxine  25 mcg Oral QAC breakfast  . losartan  25 mg Oral Daily  . mirtazapine  30 mg Oral QHS  . pantoprazole (PROTONIX) IV  40 mg Intravenous Q12H  . potassium chloride  40 mEq Oral Once  . predniSONE  20 mg Oral Q breakfast  . sertraline  100 mg Oral Daily  . simvastatin  40 mg Oral Daily  . sodium chloride flush  3 mL Intravenous Q12H  . spironolactone  12.5 mg Oral Daily  . torsemide  10 mg Oral Daily   Continuous Infusions: . sodium chloride 250 mL (04/06/21 2344)     LOS: 2 days      Time spent: 20 minutes   Kristen Stain, DO [facility] 04/08/2021, 1:35 PM   Available via Epic secure chat 7am-7pm After these hours, please refer to coverage provider listed on [email or web address]

## 2021-04-08 NOTE — Progress Notes (Signed)
Cardiology Progress Note  Patient ID: [name] MRN: [id number] DOB: 19-Dec-1922 Date of Encounter: 04/08/2021  Primary Cardiologist: [name], MD  Subjective   Chief Complaint: Shortness of breath  HPI: Good diuresis.  Chest x-ray with possible pneumonia.  Cough noted this morning.  Tiny pleural effusion noted on chest x-ray.  ROS:  All other ROS reviewed and negative. Pertinent positives noted in the HPI.     Inpatient Medications  Scheduled Meds: . carvedilol  6.25 mg Oral BID  . ferrous sulfate  325 mg Oral Q breakfast  . lactobacillus acidophilus & bulgar  1 tablet Oral Daily  . levothyroxine  25 mcg Oral QAC breakfast  . losartan  25 mg Oral Daily  . mirtazapine  30 mg Oral QHS  . pantoprazole (PROTONIX) IV  40 mg Intravenous Q12H  . potassium chloride  40 mEq Oral Once  . predniSONE  20 mg Oral Q breakfast  . sertraline  100 mg Oral Daily  . simvastatin  40 mg Oral Daily  . sodium chloride flush  3 mL Intravenous Q12H  . spironolactone  12.5 mg Oral Daily  . torsemide  10 mg Oral Daily   Continuous Infusions: . sodium chloride 250 mL (04/06/21 2344)   PRN Meds: sodium chloride, acetaminophen, ALPRAZolam, benzonatate, guaiFENesin, hydrALAZINE, ipratropium-albuterol, magnesium hydroxide, prochlorperazine, sodium chloride flush   Vital Signs   Vitals:   04/07/21 1920 04/07/21 2335 04/08/21 0313 04/08/21 0909  BP: (!) 169/88 (!) 147/87 139/71   Pulse: 89 90 81   Resp: 16 20 20    Temp: (!) 97.5 F (36.4 C) 97.7 F (36.5 C) 98.2 F (36.8 C)   TempSrc: Oral Oral Oral   SpO2: 94% 97% 96% 90%  Weight:   86.2 kg   Height:        Intake/Output Summary (Last 24 hours) at 04/08/2021 1219 Last data filed at 04/08/2021 1030 Gross per 24 hour  Intake 200 ml  Output 1100 ml  Net -900 ml   Last 3 Weights 04/08/2021 04/07/2021 04/06/2021  Weight (lbs) 190 lb 0.6 oz 197 lb 1.5 oz 220 lb 0.3 oz  Weight (kg) 86.2 kg 89.4 kg 99.8 kg      Telemetry   Overnight telemetry shows sinus rhythm with left bundle branch block, which I personally reviewed.   Physical Exam   Vitals:   04/07/21 1920 04/07/21 2335 04/08/21 0313 04/08/21 0909  BP: (!) 169/88 (!) 147/87 139/71   Pulse: 89 90 81   Resp: 16 20 20    Temp: (!) 97.5 F (36.4 C) 97.7 F (36.5 C) 98.2 F (36.8 C)   TempSrc: Oral Oral Oral   SpO2: 94% 97% 96% 90%  Weight:   86.2 kg   Height:         Intake/Output Summary (Last 24 hours) at 04/08/2021 1219 Last data filed at 04/08/2021 1030 Gross per 24 hour  Intake 200 ml  Output 1100 ml  Net -900 ml    Last 3 Weights 04/08/2021 04/07/2021 04/06/2021  Weight (lbs) 190 lb 0.6 oz 197 lb 1.5 oz 220 lb 0.3 oz  Weight (kg) 86.2 kg 89.4 kg 99.8 kg    Body mass index is 25.77 kg/m.   General: Well nourished, well developed, in no acute distress Head: Atraumatic, normal size  Eyes: PEERLA, EOMI  Neck: Supple, no JVD Endocrine: No thryomegaly Cardiac: Normal S1, S2; RRR; no murmurs, rubs, or gallops Lungs: Rales and rhonchi noted bilaterally Abd: Soft, nontender, no hepatomegaly  Ext:  No edema, pulses 2+ Musculoskeletal: No deformities, BUE and BLE strength normal and equal Skin: Warm and dry, no rashes   Neuro: Alert and oriented to person, place, time, and situation, CNII-XII grossly intact, no focal deficits  Psych: Normal mood and affect   Labs  High Sensitivity Troponin:   Recent Labs  Lab 04/06/21 1000 04/06/21 1128  TROPONINIHS 11 14     Cardiac EnzymesNo results for input(s): TROPONINI in the last 168 hours. No results for input(s): TROPIPOC in the last 168 hours.  Chemistry Recent Labs  Lab 04/06/21 1000 04/07/21 0516 04/08/21 0345  NA 139 142 142  K 4.3 3.6 3.6  CL 108 103 103  CO2 24 27 24   GLUCOSE 148* 129* 138*  BUN 13 18 24*  CREATININE 0.96 1.25* 1.19*  CALCIUM 9.1 9.7 9.2  GFRNONAA 54* 39* 42*  ANIONGAP 7 12 15     Hematology Recent Labs  Lab 04/06/21 1000 04/06/21 1620 04/07/21 0516  04/08/21 0345  WBC 7.8  --  8.7 10.1  RBC 3.79* 4.12 4.78 4.84  HGB 8.8*  --  11.5* 11.5*  HCT 29.3*  --  35.7* 36.9  MCV 77.3*  --  74.7* 76.2*  MCH 23.2*  --  24.1* 23.8*  MCHC 30.0  --  32.2 31.2  RDW 16.5*  --  17.2* 17.6*  PLT 160  --  174 193   BNP Recent Labs  Lab 04/07/21 1044  BNP 786.3*    DDimer No results for input(s): DDIMER in the last 168 hours.   Radiology  CT ABDOMEN PELVIS WO CONTRAST  Result Date: 04/06/2021 CLINICAL DATA:  Abdominal pain EXAM: CT ABDOMEN AND PELVIS WITHOUT CONTRAST TECHNIQUE: Multidetector CT imaging of the abdomen and pelvis was performed following the standard protocol without IV contrast. COMPARISON:  01/28/2018 FINDINGS: Lower chest: Lung bases demonstrate mild pleural effusions bilaterally right slightly greater than left. Some mild associated atelectasis is noted. Hepatobiliary: No focal liver abnormality is seen. Status post cholecystectomy. No biliary dilatation. Pancreas: Fatty interdigitation of the pancreas is noted. Previously seen cystic lesions are again identified and stable. Spleen: Normal in size without focal abnormality. Adrenals/Urinary Tract: Adrenal glands are unremarkable. Kidneys are well visualized bilaterally without renal calculi or obstructive changes. The ureters show no renal calculi. Multiple cysts are noted within the right kidney similar to that seen on the prior exam. The largest of these is noted anteriorly measuring approximately 5.3 cm. Bladder is well distended. Stomach/Bowel: Scattered diverticular change of the colon is noted. No evidence of diverticulitis is seen. The appendix is not well visualized. No inflammatory changes to suggest appendicitis are noted. Small bowel and stomach appear within normal limits. Vascular/Lymphatic: Atherosclerotic calcifications of the abdominal aorta are seen. No aneurysmal dilatation is noted. No sizable adenopathy is seen. Reproductive: Uterus and bilateral adnexa are unremarkable.  Other: No abdominal wall hernia or abnormality. No abdominopelvic ascites. Musculoskeletal: Bilateral hip replacements are seen. Degenerative changes of lumbar spine are noted. No other focal abnormality is seen. IMPRESSION: Small bilateral pleural effusions right greater than left with associated atelectatic changes. Stable cystic lesions within the pancreas. Given their long-term stability they are felt to be benign in etiology. Diverticulosis without diverticulitis. Electronically Signed   By: 04/08/2021 M.D.   On: 04/06/2021 15:45   DG Abd 1 View  Result Date: 04/07/2021 CLINICAL DATA:  Left upper quadrant pain EXAM: ABDOMEN - 1 VIEW COMPARISON:  10/19/2020 FINDINGS: Prior CABG. Nonobstructed bowel gas pattern. No organomegaly, free air  or suspicious calcification. Bilateral hip replacements. IMPRESSION: No acute findings. Electronically Signed   By: Charlett NoseKevin  Dover M.D.   On: 04/07/2021 14:28   DG CHEST PORT 1 VIEW  Result Date: 04/07/2021 CLINICAL DATA:  Aspiration into airway. EXAM: PORTABLE CHEST 1 VIEW COMPARISON:  Apr 06, 2021 FINDINGS: There is hazy opacity in the right base. There may be tiny bilateral effusions. Minimal atelectasis in the left base. The cardiomediastinal silhouette is stable. No pneumothorax. No other abnormalities. IMPRESSION: 1. Hazy opacity in the right base could represent effusion and underlying atelectasis, developing infiltrate such as pneumonia, or aspiration. 2. Possible tiny left effusion with underlying atelectasis. Electronically Signed   By: Gerome Samavid  Williams III M.D   On: 04/07/2021 10:35   ECHOCARDIOGRAM COMPLETE  Result Date: 04/06/2021    ECHOCARDIOGRAM REPORT   Patient Name:   [name] C Lees Date of Exam: 04/06/2021 Medical Rec #:  [id number]            Height:       70.0 in Accession #:    [id number]           Weight:       220.0 lb Date of Birth:  [birth year]            BSA:          2.174 m Patient Age:    87 years             BP:           155/90 mmHg  Patient Gender: F                    HR:           95 bpm. Exam Location:  Inpatient Procedure: 2D Echo, Cardiac Doppler and Color Doppler Indications:     I50.31 Acute diastolic (congestive) heart failure  History:         Patient has prior history of Echocardiogram examinations, most                  recent 04/14/2016. 87 year-old female. nausea.  Sonographer:     Roosvelt MaserLane Rachel Referring Phys:  [id number] Emeline GeneralPING T ZHANG Diagnosing Phys: Laurance FlattenHeather Pemberton MD IMPRESSIONS  1. Left ventricular ejection fraction, by estimation, is 30 to 35%. The left ventricle has moderately decreased function. Wall motion difficult to assess due to lack of definity contrast and incomplete visualization of the LV endocardium. Based on limited views, all mid-to-apical septal, apical inferior, mid-to-apcial anterior, and apex appear akinetic. Findings concerning for possible LAD disease. There is moderate asymmetric hypertrophy of the basal septum. The rest of the LV segments demonstrate mild concentric left ventricular hypertrophy. Indeterminate diastolic filling due to E-A fusion.  2. Right ventricular systolic function is normal. The right ventricular size is normal.  3. The mitral valve is grossly normal. There is mild thickening of the mitral valve leaflet(s). There is mild calcification of the mitral valve leaflet(s). Mild to moderate mitral annular calcification. Trivial mitral valve regurgitation. No evidence of  mitral stenosis.  4. The aortic valve was not well visualized. Aortic valve regurgitation is not visualized.  5. There is flow acceleration noted at the level of the pulmonic valve (poor visualization of the valve) with mean gradient 12mmHg, peak gradient 28mmHg, Vmax 2.7021m/s. Either mild PS or flow acceleration due to vigorous RV systolic function. Comparison(s): Compared to prior echo report, the LVEF is now moderately reduced to 30-35% (previously 60-65%) with wall motion abnormalities  as detailed above. FINDINGS  Left  Ventricle: Left ventricular ejection fraction, by estimation, is 30 to 35%. The left ventricle has moderately decreased function. Wall motion difficult to assess due to lack of definity contrast and incomplete visualization of the LV endocardium. Based on limited views, all mid-to-apical septal, apical inferior, mid-to-apcial anterior, and apex appear akinetic. Findings consistent with LAD territory. The left ventricular internal cavity size was normal in size. There is moderate asymmetric hypertrophy of the basal septum. The rest of the LV segments demonstrate mild concentric left ventricular hypertrophy. Indeterminate diastolic filling due to E-A fusion. Right Ventricle: The right ventricular size is normal. No increase in right ventricular wall thickness. Right ventricular systolic function is normal. Left Atrium: Left atrial size was normal in size. Right Atrium: Right atrial size was normal in size. Pericardium: There is no evidence of pericardial effusion. Mitral Valve: The mitral valve is grossly normal. There is mild thickening of the mitral valve leaflet(s). There is mild calcification of the mitral valve leaflet(s). Mild to moderate mitral annular calcification. Trivial mitral valve regurgitation. No evidence of mitral valve stenosis. Tricuspid Valve: The tricuspid valve is normal in structure. Tricuspid valve regurgitation is trivial. Aortic Valve: The aortic valve was not well visualized. Aortic valve regurgitation is not visualized. Pulmonic Valve: The pulmonic valve was not well visualized. Pulmonic valve regurgitation is trivial. Aorta: The aortic root is normal in size and structure. Pulmonary Artery: There is flow acceleration noted at the level of the pulmonic valve (poor visualization of the valve) with mean gradient , peak gradient , Vmax 2.56m/s. Either mild PS or flow acceleration due to vigorous RV systolic function. IAS/Shunts: No atrial level shunt detected by color flow Doppler.   LEFT VENTRICLE PLAX 2D LVIDd:         3.20 cm LVIDs:         2.50 cm LV PW:         1.40 cm LV IVS:        1.50 cm LVOT diam:     1.80 cm LV SV:         44 LV SV Index:   20 LVOT Area:     2.54 cm  LV Volumes (MOD) LV vol d, MOD A4C: 61.6 ml LV vol s, MOD A4C: 42.6 ml LV SV MOD A4C:     61.6 ml LEFT ATRIUM             Index LA diam:        3.25 cm 1.50 cm/m LA Vol (A2C):   43.9 ml 20.20 ml/m LA Vol (A4C):   51.0 ml 23.46 ml/m LA Biplane Vol: 46.8 ml 21.53 ml/m  AORTIC VALVE             PULMONIC VALVE LVOT Vmax:   94.60 cm/s  PV Vmax:       2.63 m/s LVOT Vmean:  60.400 cm/s PV Vmean:      158.000 cm/s LVOT VTI:    0.172 m     PV VTI:        0.421 m                          PV Peak grad:  27.7 mmHg AORTA                    PV Mean grad:  12.0 mmHg Ao Root diam: 3.10 cm   SHUNTS Systemic VTI:  0.17 m Systemic Diam:  1.80 cm Laurance Flatten MD Electronically signed by Laurance Flatten MD Signature Date/Time: 04/06/2021/6:53:25 PM    Final (Updated)     Cardiac Studies  TTE 04/06/2021 1. Left ventricular ejection fraction, by estimation, is 30 to 35%. The  left ventricle has moderately decreased function. Wall motion difficult to  assess due to lack of definity contrast and incomplete visualization of  the LV endocardium. Based on  limited views, all mid-to-apical septal, apical inferior, mid-to-apcial  anterior, and apex appear akinetic. Findings concerning for possible LAD  disease. There is moderate asymmetric hypertrophy of the basal septum. The  rest of the LV segments  demonstrate mild concentric left ventricular hypertrophy. Indeterminate  diastolic filling due to E-A fusion.  2. Right ventricular systolic function is normal. The right ventricular  size is normal.  3. The mitral valve is grossly normal. There is mild thickening of the  mitral valve leaflet(s). There is mild calcification of the mitral valve  leaflet(s). Mild to moderate mitral annular calcification. Trivial mitral   valve regurgitation. No evidence of  mitral stenosis.  4. The aortic valve was not well visualized. Aortic valve regurgitation  is not visualized.  5. There is flow acceleration noted at the level of the pulmonic valve  (poor visualization of the valve) with mean gradient , peak gradient  , Vmax 2.22m/s. Either mild PS or flow acceleration due to vigorous  RV systolic function.   Patient Profile  [name] is a 87 y.o. female with systolic heart failure with recovery of EF, left bundle branch block, paroxysmal atrial fibrillation (not on anticoagulation due to fall risk), hypertension, hypothyroidism who was admitted on 04/06/2021 with acute systolic heart failure.  Assessment & Plan   1.  Acute systolic heart failure, EF 30-35% -Unclear etiology.  Appears euvolemic.  Continue torsemide 10 mg daily. -Not a candidate for aggressive cardiovascular care.  She also is not interested in this. -Continue medical management of cardiomyopathy.  She is on Coreg 6.25 mg twice daily.  Continue losartan 25 mg daily.  Continue Aldactone 12.5 mg daily. -Issue currently appears to be related to possible aspiration pneumonia.  2.  Anemia -Work-up per primary team.  No anticoagulation.  3.  Paroxysmal A. Fib -Not on anticoagulation due to fall risk -Remaining in sinus rhythm.  [facility] will sign off.   Medication Recommendations: Medical management as above Other recommendations (labs, testing, etc): None Follow up as an outpatient: None needed.  I suspect she will go home with palliative care and hospice.  For questions or updates, please contact [facility] Please consult [email or web address] for contact info under   Time Spent with Patient: I have spent a total of 25 minutes with patient reviewing hospital notes, telemetry, EKGs, labs and examining the patient as well as establishing an assessment and plan that was discussed with the patient.  > 50% of time was spent  in direct patient care.    Signed, Lenna Gilford. Flora Lipps, MD, [facility]  [facility]  04/08/2021 12:19 PM

## 2021-04-08 NOTE — Evaluation (Signed)
Occupational Therapy Evaluation Patient Details Name: [name] MRN: [id number] DOB: Oct 08, 1923 Today's Date: 04/08/2021    History of Present Illness 87 yo admitted 5/20 with SOB and CHF exacerbation. PMHx: diastolic dysfunction with history of CHF, hypertension, hypothyroidism, LBBB, macular degeneration, and cystic pancreatic mass   Clinical Impression   PT admitted with above dx. Pt currently with functional limitiations due to the deficits pt limited by decreased strength, decreased ability to care for self and decreased awareness of deficits. PT set-upA to maxA for ADL and minA for sit to stand from recliner to sink x2 times. Pt fatigued and expressing anxiety about her current situation and really wants to return home. Pt will benefit from skilled OT to increase their independence and safety with adls and balance to allow discharge to SNF. OT following acutely.     Follow Up Recommendations  SNF    Equipment Recommendations  3 in 1 bedside commode    Recommendations for Other Services       Precautions / Restrictions Precautions Precautions: Fall Restrictions Weight Bearing Restrictions: No      Mobility Bed Mobility               General bed mobility comments: in recliner pre and post session    Transfers Overall transfer level: Needs assistance Equipment used: 1 person hand held assist Transfers: Sit to/from Stand Sit to Stand: Min assist         General transfer comment: Pt pulling to stand from sink x2    Balance Overall balance assessment: Needs assistance Sitting-balance support: No upper extremity supported;Feet supported Sitting balance-Leahy Scale: Fair     Standing balance support: Bilateral upper extremity supported Standing balance-Leahy Scale: Poor Standing balance comment: standing briefly at sink, but fatigues                           ADL either performed or assessed with clinical judgement   ADL Overall  ADL's : Needs assistance/impaired Eating/Feeding: Set up   Grooming: Supervision/safety;Sitting   Upper Body Bathing: Minimal assistance;Sitting   Lower Body Bathing: Maximal assistance;Cueing for safety;Sitting/lateral leans;Sit to/from stand   Upper Body Dressing : Minimal assistance;Sitting   Lower Body Dressing: Maximal assistance;Sitting/lateral leans;Sit to/from stand   Toilet Transfer: Minimal assistance;Cueing for safety   Toileting- Clothing Manipulation and Hygiene: Moderate assistance;Sitting/lateral lean;Sit to/from stand;Cueing for safety       Functional mobility during ADLs: Minimal assistance;Cueing for safety (Sit to stand) General ADL Comments: Pt limited by decreased strength, decreased ability to care for self and decreased awareness of deficits.     Vision Baseline Vision/History: No visual deficits Patient Visual Report: No change from baseline Vision Assessment?: No apparent visual deficits     Perception     Praxis      Pertinent Vitals/Pain Pain Assessment: No/denies pain Pain Intervention(s): Monitored during session     Hand Dominance Right   Extremity/Trunk Assessment Upper Extremity Assessment Upper Extremity Assessment: Generalized weakness   Lower Extremity Assessment Lower Extremity Assessment: Generalized weakness   Cervical / Trunk Assessment Cervical / Trunk Assessment: Kyphotic   Communication Communication Communication: No difficulties   Cognition Arousal/Alertness: Awake/alert Behavior During Therapy: Anxious Overall Cognitive Status: Impaired/Different from baseline Area of Impairment: Attention;Memory;Problem solving;Safety/judgement;Awareness                   Current Attention Level: Sustained Memory: Decreased short-term memory   Safety/Judgement: Decreased awareness of safety;Decreased awareness of  deficits Awareness: Intellectual Problem Solving: Slow processing;Requires verbal cues;Requires tactile  cues General Comments: "I think I will be okay at home even though I am so worried about falling." "My niece is helping me decide.. $9-10,000 a month to live in the place." Pt perseverating on cost of next placement and possibility of  not returning to ALF,   General Comments  VSS.    Exercises     Shoulder Instructions      Home Living Family/patient expects to be discharged to:: Assisted living                             Home Equipment: Dan Humphreys - 2 wheels;Shower seat   Additional Comments: [facility]      Prior Functioning/Environment Level of Independence: Needs assistance  Gait / Transfers Assistance Needed: walks with RW ADL's / Homemaking Assistance Needed: assist with bathing and dressing staff does all the homemaking            OT Problem List: Decreased strength;Decreased activity tolerance;Impaired balance (sitting and/or standing);Decreased cognition;Decreased safety awareness;Pain      OT Treatment/Interventions: Self-care/ADL training;Therapeutic exercise;Energy conservation;Therapeutic activities;Cognitive remediation/compensation;Patient/family education;Balance training    OT Goals(Current goals can be found in the care plan section) Acute Rehab OT Goals Patient Stated Goal: I'd like to return to my apartment OT Goal Formulation: With patient Time For Goal Achievement: 04/22/21 Potential to Achieve Goals: Good ADL Goals Pt Will Perform Grooming: with supervision;standing Pt Will Transfer to Toilet: with min guard assist;stand pivot transfer;bedside commode Pt Will Perform Toileting - Clothing Manipulation and hygiene: with min assist;sit to/from stand Pt/caregiver will Perform Home Exercise Program: Increased strength;With Supervision Additional ADL Goal #1: Pt will increase to standing x3 mins for OOB ADL tasks to increase activity tolerance. Additional ADL Goal #2: Pt will recall 3 energy conservation techniques for OOB ADL/mobility.   OT Frequency: Min 2X/week   Barriers to D/C:            Co-evaluation              AM-PAC OT "6 Clicks" Daily Activity     Outcome Measure Help from another person eating meals?: None Help from another person taking care of personal grooming?: A Little Help from another person toileting, which includes using toliet, bedpan, or urinal?: A Lot Help from another person bathing (including washing, rinsing, drying)?: A Lot Help from another person to put on and taking off regular upper body clothing?: A Little Help from another person to put on and taking off regular lower body clothing?: A Lot 6 Click Score: 16   End of Session Equipment Utilized During Treatment: Gait belt Nurse Communication: Mobility status  Activity Tolerance: Patient limited by fatigue Patient left: in chair;with call bell/phone within reach;with chair alarm set  OT Visit Diagnosis: Unsteadiness on feet (R26.81);Muscle weakness (generalized) (M62.81)                Time: 3299-2426 OT Time Calculation (min): 25 min Charges:  OT General Charges $OT Visit: 1 Visit OT Evaluation $OT Eval Moderate Complexity: 1 Mod OT Treatments $Self Care/Home Management : 8-22 mins  Flora Lipps, OTR/L Acute Rehabilitation Services Pager: [phone] Office: [phone]   Lonzo Cloud 04/08/2021, 4:47 PM

## 2021-04-08 NOTE — TOC Initial Note (Signed)
Transition of Care [facility]) - Initial/Assessment Note    Patient Details  Name: [name] MRN: [id number] Date of Birth: 06-02-1923  Transition of Care [facility]) CM/SW Contact:    Bary Castilla, LCSW Phone Number: [phone] 04/08/2021, 3:33 PM  Clinical Narrative:    CSW met with patient and niece- in law [name] to discuss PT  recommendation of a SNF. Patient was aware of recommendation and in agreement with going to a ST SNF. CSW discussed the SNF process.CSW provided patient with medicare.gov rating list.  Patient gave CSW permission to fax referrals out to local facilities.CSW answered questions about the SNF process and the next steps in the process.  Patient stated that she is unable to walk and does not believe that [facility] can offer enough support. A discussion around LTC was also reviewed because pt's states that may be the ultimate disposition plan for her. Pt is requesting a private room.  CSW also spoke with pt's friend [name] in regards to the SNF. [name] explained that she is a Dietitian and has known pt for years. [name] informed CSW that she has reached out to several facilities in hopes that pt could go to their facility for ST- SNF. CSW explained that that these facilities usually only takes their own resident and [name] was aware.  CSW was informed that pt's [facility] POA was niece [name] however CSW had to leave message.                     Expected Discharge Plan: Skilled Nursing Facility Barriers to Discharge: SNF Pending bed offer,Continued Medical Work up,Insurance Authorization   Patient Goals and CMS Choice Patient states their goals for this hospitalization and ongoing recovery are:: To get better CMS Medicare.gov Compare Post Acute Care list provided to:: Patient Choice offered to / list presented to : Patient  Expected Discharge Plan and Services Expected Discharge Plan: [place]                                               Prior Living Arrangements/Services     Patient language and need for interpreter reviewed:: Yes          Care giver support system in place?: Yes (comment)      Activities of Daily Living Home Assistive Devices/Equipment: Built-in shower seat,Eyeglasses,Walker (specify type) ADL Screening (condition at time of admission) Patient's cognitive ability adequate to safely complete daily activities?: Yes Is the patient deaf or have difficulty hearing?: Yes Does the patient have difficulty seeing, even when wearing glasses/contacts?: Yes Does the patient have difficulty concentrating, remembering, or making decisions?: No Patient able to express need for assistance with ADLs?: Yes Does the patient have difficulty dressing or bathing?: Yes Independently performs ADLs?: No Communication: Independent Dressing (OT): Needs assistance Is this a change from baseline?: Pre-admission baseline Grooming: Needs assistance Is this a change from baseline?: Pre-admission baseline Feeding: Independent Bathing: Needs assistance Is this a change from baseline?: Pre-admission baseline Toileting: Needs assistance Is this a change from baseline?: Pre-admission baseline In/Out Bed: Independent with device (comment) Walks in Home: Independent with device (comment) Does the patient have difficulty walking or climbing stairs?: Yes Weakness of Legs: Both Weakness of Arms/Hands: None  Permission Sought/Granted   Permission granted to share information with : Yes, Verbal Permission Granted  Share  Information with NAME: [name]  Permission granted to share info w AGENCY: SNFs  Permission granted to share info w Relationship: Niece  Permission granted to share info w Contact Information: [phone]  Emotional Assessment Appearance:: Appears stated age Attitude/Demeanor/Rapport: Engaged Affect (typically observed): Accepting,Adaptable Orientation: : Oriented to Self,Oriented to  Place,Oriented to  Time,Oriented to Situation      Admission diagnosis:  CHF (congestive heart failure) ([place]) [I50.9] Reactive airway disease with acute exacerbation, unspecified asthma severity, unspecified whether persistent [J45.901] Community acquired pneumonia, unspecified laterality [J18.9] Patient Active Problem List   Diagnosis Date Noted  . Microcytic anemia 04/07/2021  . Iron deficiency anemia 04/07/2021  . CKD (chronic kidney disease) stage 3, GFR 30-59 ml/min (HCC) 04/07/2021  . Pancreas cyst 04/07/2021  . Hypothyroid 04/07/2021  . Aspiration into airway 04/07/2021  . Palliative care encounter   . Reactive airway disease 12/09/2017  . Anxiety and depression 12/09/2017  . Acute respiratory failure ([place]) 12/09/2017  . Normocytic anemia 04/13/2016  . Acute on chronic combined systolic and diastolic CHF (congestive heart failure) ([place]) 04/13/2016  . Pyelonephritis 04/12/2016  . Chronic combined systolic and diastolic congestive heart failure ([place]) 11/14/2013  . Constipation 11/14/2013  . Nausea 11/14/2013  . Influenza with respiratory manifestations 11/09/2013  . AKI (acute kidney injury) ([place]) 11/09/2013  . Influenza 11/07/2013  . Acute on chronic combined systolic and diastolic congestive heart failure ([place]) 11/07/2013  . Acute respiratory failure with hypoxia ([place]) 11/07/2013  . Fever 11/07/2013  . Generalized weakness 11/07/2013  . Peripheral neuropathy 05/05/2012  . Closed left hip fracture ([place]) 05/04/2012  . Chronic systolic HF (heart failure) ([place]) 05/04/2012  . Arthritis 07/23/2011  . DYSPNEA 07/31/2010  . DEPRESSIVE DISORDER NOT ELSEWHERE CLASSIFIED 02/08/2009  . Type 2 diabetes mellitus with diabetic neuropathy, without long-term current use of insulin ([place]) 11/08/2008  . HLD (hyperlipidemia) 11/08/2008  . Essential hypertension 11/08/2008  . LBBB (left bundle branch block) 11/08/2008  . Atrial fibrillation ([place]) 11/08/2008  . GERD 11/08/2008  . HEPATIC  CYST 11/08/2008  . CHOLELITHIASIS 11/08/2008  . DILATION AND CURETTAGE, HX OF 11/08/2008   PCP:  Mayra Neer, MD Pharmacy:   [facility] - [place], [place] [address] Phone: [phone] Fax: [place], [address] AT [facility] [address] [place] [address] Phone: [phone] Fax: [phone]     Social Determinants of Health (SDOH) Interventions    Readmission Risk Interventions No flowsheet data found.

## 2021-04-08 NOTE — Progress Notes (Signed)
Hgb stable.  Per chart review, nausea and other admission symptoms improved.  [facility] will revisit tomorrow.  Unless patient having dramatic decrease in Hgb or destabilizing bleeding, there will be no plans for endoscopic evaluation during this admission.

## 2021-04-09 DIAGNOSIS — I5043 Acute on chronic combined systolic (congestive) and diastolic (congestive) heart failure: Secondary | ICD-10-CM | POA: Diagnosis not present

## 2021-04-09 DIAGNOSIS — Z7189 Other specified counseling: Secondary | ICD-10-CM

## 2021-04-09 DIAGNOSIS — Z515 Encounter for palliative care: Secondary | ICD-10-CM | POA: Diagnosis not present

## 2021-04-09 LAB — BASIC METABOLIC PANEL
Anion gap: 13 (ref 5–15)
BUN: 36 mg/dL — ABNORMAL HIGH (ref 8–23)
CO2: 27 mmol/L (ref 22–32)
Calcium: 8.8 mg/dL — ABNORMAL LOW (ref 8.9–10.3)
Chloride: 101 mmol/L (ref 98–111)
Creatinine, Ser: 1.31 mg/dL — ABNORMAL HIGH (ref 0.44–1.00)
GFR, Estimated: 37 mL/min — ABNORMAL LOW (ref >60)
Glucose, Bld: 107 mg/dL — ABNORMAL HIGH (ref 70–99)
Potassium: 3.4 mmol/L — ABNORMAL LOW (ref 3.5–5.1)
Sodium: 141 mmol/L (ref 135–145)

## 2021-04-09 LAB — CBC
HCT: 38.2 % (ref 36.0–46.0)
Hemoglobin: 11.7 g/dL — ABNORMAL LOW (ref 12.0–15.0)
MCH: 23.8 pg — ABNORMAL LOW (ref 26.0–34.0)
MCHC: 30.6 g/dL (ref 30.0–36.0)
MCV: 77.6 fL — ABNORMAL LOW (ref 80.0–100.0)
Platelets: 162 10*3/uL (ref 150–400)
RBC: 4.92 MIL/uL (ref 3.87–5.11)
RDW: 17.6 % — ABNORMAL HIGH (ref 11.5–15.5)
WBC: 11.1 10*3/uL — ABNORMAL HIGH (ref 4.0–10.5)
nRBC: 0.2 % (ref 0.0–0.2)

## 2021-04-09 LAB — MYCOPLASMA PNEUMONIAE ANTIBODY, IGM: Mycoplasma pneumo IgM: <770 U/mL (ref 0–769)

## 2021-04-09 MED ORDER — PANTOPRAZOLE SODIUM 40 MG PO TBEC
40.0000 mg | DELAYED_RELEASE_TABLET | Freq: Two times a day (BID) | ORAL | Status: DC
  Administered 2021-04-09 – 2021-04-11 (×4): 40 mg via ORAL
  Filled 2021-04-09 (×4): qty 1

## 2021-04-09 MED ORDER — POTASSIUM CHLORIDE 20 MEQ PO PACK
40.0000 meq | PACK | Freq: Once | ORAL | Status: AC
  Administered 2021-04-09: 40 meq via ORAL
  Filled 2021-04-09: qty 2

## 2021-04-09 NOTE — Progress Notes (Signed)
Subjective: Patient on room air, however, intermittent wheezing audible. She states her last bowel movement was yesterday evening, denies noticing blood in stool or black stools. Reports difficulty initiating urination.  Objective: Vital signs in last 24 hours: Temp:  [98 F (36.7 C)-98.5 F (36.9 C)] 98.3 F (36.8 C) (05/23 1058) Pulse Rate:  [74-81] 74 (05/23 1058) Resp:  [16-18] 18 (05/23 1058) BP: (121-143)/(62-76) 134/67 (05/23 1058) SpO2:  [92 %-96 %] 95 % (05/23 1058) Weight:  [87.4 kg] 87.4 kg (05/23 0500) Weight change: 1.2 kg Last BM Date: 04/08/21  PE: Elderly female GENERAL: Able to speak few words, not using accessory muscles of respiration ABDOMEN: Soft, nondistended, nontender, normoactive bowel sounds EXTREMITIES: No deformity  Lab Results: Results for orders placed or performed during the hospital encounter of 04/06/21 (from the past 48 hour(s))  Basic metabolic panel     Status: Abnormal   Collection Time: 04/08/21  3:45 AM  Result Value Ref Range   Sodium 142 135 - 145 mmol/L   Potassium 3.6 3.5 - 5.1 mmol/L   Chloride 103 98 - 111 mmol/L   CO2 24 22 - 32 mmol/L   Glucose, Bld 138 (H) 70 - 99 mg/dL    Comment: Glucose reference range applies only to samples taken after fasting for at least 8 hours.   BUN 24 (H) 8 - 23 mg/dL   Creatinine, Ser 1.61 (H) 0.44 - 1.00 mg/dL   Calcium 9.2 8.9 - 09.6 mg/dL   GFR, Estimated 42 (L) >60 mL/min    Comment: (NOTE) Calculated using the CKD-EPI Creatinine Equation (2021)    Anion gap 15 5 - 15    Comment: Performed at [facility] Lab, [address], [place], [place] [address]  CBC     Status: Abnormal   Collection Time: 04/08/21  3:45 AM  Result Value Ref Range   WBC 10.1 4.0 - 10.5 K/uL   RBC 4.84 3.87 - 5.11 MIL/uL   Hemoglobin 11.5 (L) 12.0 - 15.0 g/dL   HCT 98.1 19.1 - 47.8 %   MCV 76.2 (L) 80.0 - 100.0 fL   MCH 23.8 (L) 26.0 - 34.0 pg   MCHC 31.2 30.0 - 36.0 g/dL   RDW 29.5 (H) 62.1 - 30.8 %    Platelets 193 150 - 400 K/uL   nRBC 0.0 0.0 - 0.2 %    Comment: Performed at [facility], [address] [place], [place] [address]  Magnesium     Status: None   Collection Time: 04/08/21  3:45 AM  Result Value Ref Range   Magnesium 2.2 1.7 - 2.4 mg/dL    Comment: Performed at [facility], [address], [place], [place] [address]  Basic metabolic panel     Status: Abnormal   Collection Time: 04/09/21  7:55 AM  Result Value Ref Range   Sodium 141 135 - 145 mmol/L   Potassium 3.4 (L) 3.5 - 5.1 mmol/L   Chloride 101 98 - 111 mmol/L   CO2 27 22 - 32 mmol/L   Glucose, Bld 107 (H) 70 - 99 mg/dL    Comment: Glucose reference range applies only to samples taken after fasting for at least 8 hours.   BUN 36 (H) 8 - 23 mg/dL   Creatinine, Ser 5.28 (H) 0.44 - 1.00 mg/dL   Calcium 8.8 (L) 8.9 - 10.3 mg/dL   GFR, Estimated 37 (L) >60 mL/min    Comment: (NOTE) Calculated using the CKD-EPI Creatinine Equation (  2021)    Anion gap 13 5 - 15    Comment: Performed at [facility] Lab, [address] [place], [place] [address]  CBC     Status: Abnormal   Collection Time: 04/09/21  7:55 AM  Result Value Ref Range   WBC 11.1 (H) 4.0 - 10.5 K/uL   RBC 4.92 3.87 - 5.11 MIL/uL   Hemoglobin 11.7 (L) 12.0 - 15.0 g/dL   HCT 18.5 63.1 - 49.7 %   MCV 77.6 (L) 80.0 - 100.0 fL   MCH 23.8 (L) 26.0 - 34.0 pg   MCHC 30.6 30.0 - 36.0 g/dL   RDW 02.6 (H) 37.8 - 58.8 %   Platelets 162 150 - 400 K/uL   nRBC 0.2 0.0 - 0.2 %    Comment: Performed at [facility], [address] [place], [place] [address]    Studies/Results: DG Abd 1 View  Result Date: 04/07/2021 CLINICAL DATA:  Left upper quadrant pain EXAM: ABDOMEN - 1 VIEW COMPARISON:  10/19/2020 FINDINGS: Prior CABG. Nonobstructed bowel gas pattern. No organomegaly, free air or suspicious calcification. Bilateral hip replacements. IMPRESSION: No acute findings. Electronically Signed   By: Charlett Nose M.D.   On: 04/07/2021 14:28     Medications: I have reviewed the patient's current medications.  Assessment: Hemoglobin 8.8 on admission without obvious melena or hematochezia but low MCV Surprisingly after 1 unit PRBC transfusion, hemoglobin has increased to 11.5 and remained steady at 11.7 today Iron saturation 6% with a ferritin of 24 and TIBC of 377 CT abdomen and pelvis showed stable cystic lesion in pancreas and diverticulosis without any other GI abnormalities  Plan: Prior records reviewed, colonoscopy from 2005 showed cecal AVM and left-sided diverticulosis, colonoscopy from 2003 with tubular adenoma removed from cecum and left-sided diverticulosis.  Without obvious melena or hematochezia, doubt anemia is related to GI blood loss.  Recommend conservative management, transfusion if needed.  She is on dysphagia level 3 diet, tolerating well, continue ferrous sulfate 325 mg daily and pantoprazole 40 mg twice a day.  GI will sign off, please feel free to recall [place] if needed.   Kerin Salen, MD 04/09/2021, 12:00 PM

## 2021-04-09 NOTE — Progress Notes (Signed)
Heart Failure Navigator Progress Note  Assessed for Heart & Vascular TOC clinic readiness.  Unfortunately at this time the patient does not meet criteria due to plan for hospice involvement at [facility] after discharge.   Navigator available for reassessment of patient.   Sharen Hones, PharmD, BCPS Heart Failure Stewardship Pharmacist Phone [phone]

## 2021-04-09 NOTE — Progress Notes (Signed)
Physical Therapy Treatment Patient Details Name: [name] MRN: [id number] DOB: 03/22/1923 Today's Date: 04/09/2021    History of Present Illness 87 yo admitted 5/20 with SOB and CHF exacerbation. PMHx: diastolic dysfunction with history of CHF, hypertension, hypothyroidism, LBBB, macular degeneration, and cystic pancreatic mass    PT Comments    Patient progressing slowly towards PT goals. Reports feeling tired today but highly motivated to participate in therapy. Reports doing some there ex in bed for LE strengthening throughout the night/day. Requires Mod A initially to stand from EOB progressing to Min A-Min guard with proper cues/technique. Tolerated short distance ambulation with min A- noted to have bil knee instability with partial buckling. Reviewed HEP/there ex adding bridging. Continue to recommend SNF. Will follow.    Follow Up Recommendations  SNF;Supervision for mobility/OOB     Equipment Recommendations  Hospital bed;3in1 (PT)    Recommendations for Other Services       Precautions / Restrictions Precautions Precautions: Fall Restrictions Weight Bearing Restrictions: No    Mobility  Bed Mobility Overal bed mobility: Needs Assistance Bed Mobility: Supine to Sit;Sit to Supine     Supine to sit: Min guard;HOB elevated Sit to supine: Min guard;HOB elevated   General bed mobility comments: INcreased time/effort with use of rail but no assist needed.    Transfers Overall transfer level: Needs assistance Equipment used: Rolling walker (2 wheeled) Transfers: Sit to/from Stand Sit to Stand: Mod assist;Min assist;Min guard         General transfer comment: Initially Mod A to power to standing with cues for hand placement with posterior bias; progressing to Min A-min guard with cues for anterior weight shift.  Ambulation/Gait Ambulation/Gait assistance: Min assist Gait Distance (Feet): 30 Feet Assistive device: Rolling walker (2 wheeled) Gait  Pattern/deviations: Step-through pattern;Decreased stride length;Narrow base of support;Trunk flexed Gait velocity: decreased Gait velocity interpretation: <1.8 ft/sec, indicate of risk for recurrent falls General Gait Details: Slow, unsteady gait with bil knee instability and assist for RW management/balance esp with turns.   Stairs             Wheelchair Mobility    Modified Rankin (Stroke Patients Only)       Balance Overall balance assessment: Needs assistance Sitting-balance support: Feet supported;No upper extremity supported Sitting balance-Leahy Scale: Fair Sitting balance - Comments: supervision for safety   Standing balance support: During functional activity Standing balance-Leahy Scale: Poor Standing balance comment: Requires UE support ins tanding.                            Cognition Arousal/Alertness: Awake/alert Behavior During Therapy: WFL for tasks assessed/performed Overall Cognitive Status: Impaired/Different from baseline Area of Impairment: Memory                     Memory: Decreased short-term memory         General Comments: Repeating self a lot during session. Seems to have better awareness of weakness/need to mobilize.      Exercises General Exercises - Lower Extremity Ankle Circles/Pumps: AROM;Both;10 reps;Seated Long Arc Quad: AROM;Both;10 reps;Seated Hip ABduction/ADduction: AROM;Both;5 reps;Seated Hip Flexion/Marching: AROM;Both;5 reps;Seated Other Exercises Other Exercises: bridging x10 Other Exercises: Sit to stand x6 for strengthening    General Comments General comments (skin integrity, edema, etc.): VSS on RA      Pertinent Vitals/Pain Pain Assessment: No/denies pain    Home Living  Prior Function            PT Goals (current goals can now be found in the care plan section) Progress towards PT goals: Progressing toward goals (slowly)    Frequency    Min  3X/week      PT Plan Current plan remains appropriate    Co-evaluation              AM-PAC PT "6 Clicks" Mobility   Outcome Measure  Help needed turning from your back to your side while in a flat bed without using bedrails?: A Little Help needed moving from lying on your back to sitting on the side of a flat bed without using bedrails?: A Little Help needed moving to and from a bed to a chair (including a wheelchair)?: A Lot Help needed standing up from a chair using your arms (e.g., wheelchair or bedside chair)?: A Lot Help needed to walk in hospital room?: A Little Help needed climbing 3-5 steps with a railing? : Total 6 Click Score: 14    End of Session Equipment Utilized During Treatment: Gait belt Activity Tolerance: Patient tolerated treatment well Patient left: in bed;with call bell/phone within reach;with bed alarm set Nurse Communication: Mobility status PT Visit Diagnosis: Other abnormalities of gait and mobility (R26.89);Muscle weakness (generalized) (M62.81);Difficulty in walking, not elsewhere classified (R26.2)     Time: 1510-1536 PT Time Calculation (min) (ACUTE ONLY): 26 min  Charges:  $Gait Training: 8-22 mins $Therapeutic Activity: 8-22 mins                     Vale Haven, PT, DPT Acute Rehabilitation Services Pager [phone] Office [phone]       Blake Divine A Lanier Ensign 04/09/2021, 4:19 PM

## 2021-04-09 NOTE — Progress Notes (Signed)
Palliative:  HPI: 87 y.o. female with multiple medical problems including diastolic dysfunction with history of CHF, hypertension, hypothyroidism, and cystic pancreatic mass.  Patient was admitted to the hospital on 04/06/2021 with shortness of breath secondary to CHF exacerbation.  She was also found to have anemia with initial concern for GI bleed.  Palliative care was consulted up address goals.  I met today with Ms. Kesling' and no visitors at bedside. She talks to me about her concern of where she goes from here. She is worried that she needs more assistance than can be provided at her previous ALF. She is fixated on financial responsibilities for next facility. Difficult to advance conversation with Ms. Gilles as she is fixated on her financial situation and unable to get her focused to discuss her health situation or goals. She does confirm that she is feeling well and denies pain or shortness of breath. I do see that niece, Juliann Pulse, is working with [facility] team for placement. I will plan to reach out to [facility] to further discuss option for hospice at facility.   All questions/concerns addressed. Emotional support provided.   Exam: Alert, oriented. No distress. Breathing regular, unlabored. Abd flat. Moves all extremities.   Plan: - Awaiting placement.  - Potential to add hospice support in the near future (if entering facility under rehab benefit will need to wait for hospice until after rehab)  25 min  Vinie Sill, NP Palliative Medicine Team Pager [phone] (Please see [email or web address] for schedule) Team Phone [phone]    Greater than 50%  of this time was spent counseling and coordinating care related to the above assessment and plan

## 2021-04-09 NOTE — Progress Notes (Signed)
  Speech Language Pathology Treatment: Dysphagia  Patient Details Name: LIEN LYMAN MRN: [id number] DOB: Jul 25, 1923 Today's Date: 04/09/2021 Time: 3009-2330 SLP Time Calculation (min) (ACUTE ONLY): 13 min  Assessment / Plan / Recommendation Clinical Impression  Pt denies any trouble swallowing and says that she has not had any additional episodes of N/V since Saturday. She has a functional appearing oropharyngeal swallow with regular solids and thin liquids. She does report that there are certain meats that get stuck in her teeth as she is masticating them, but that she does not necessarily like all her food coming softer and chopped into bite-sized pieces. Will advance to regular solids, continuing thin liquids. No further SLP f/u indicated at this time.    HPI HPI: Pt is a 87 yo female presenting with worsening SOB. CXR on admission with bilateral infiltrates versus edema; repeat imaging 5/21 potentially indicative of aspiration in the R lung base. Pt had an episode of coughing and vomiting that morning. PMH includes: GERD, CHF, HTN, pancreatic mass, LBBB, hypothyroidism      SLP Plan  All goals met       Recommendations  Diet recommendations: Regular;Thin liquid Liquids provided via: Straw;Cup Medication Administration: Whole meds with puree Supervision: Patient able to self feed Compensations: Minimize environmental distractions;Slow rate;Small sips/bites Postural Changes and/or Swallow Maneuvers: Seated upright 90 degrees;Upright 30-60 min after meal                Oral Care Recommendations: Oral care BID Follow up Recommendations: 24 hour supervision/assistance SLP Visit Diagnosis: Dysphagia, unspecified (R13.10) Plan: All goals met       GO                Osie Bond., M.A. [place] Acute Rehabilitation Services Pager [phone] Office [phone]  04/09/2021, 12:25 PM

## 2021-04-09 NOTE — TOC Progression Note (Addendum)
Transition of Care [facility]) - Progression Note    Patient Details  Name: [name] MRN: [id number] Date of Birth: 1923-07-21  [facility] CM/SW Contact  Ivette Loyal, [place] Phone Number: 04/09/2021, 12:01 PM  Clinical Narrative:    1200: CSW contacted [name], pt niece who had concerns about SNF locations. CSW shared locations with niece and she is only interested in [facility],  [facility], [facility], and [facility]. CSW explained that [place] and [facility] were private facilities, she was still interested and will contact those for more information. CSW will follow up with [facility] and [facility].   1640: CSW spoke with [facility], she stated that she may have an opening tomorrow but she will follow up after a possible DC in the morning. CSW also contacted [facility] with no answer CSW left a VM for bed availability.   Expected Discharge Plan: Skilled Nursing Facility Barriers to Discharge: SNF Pending bed offer,Continued Medical Work up,Insurance Authorization  Expected Discharge Plan and Services Expected Discharge Plan: Skilled Nursing Facility                                               Social Determinants of Health (SDOH) Interventions    Readmission Risk Interventions No flowsheet data found.

## 2021-04-09 NOTE — Progress Notes (Signed)
PROGRESS NOTE    [name]  NKN:[id number] DOB: November 15, 1923 DOA: 04/06/2021 PCP: [name], MD     Brief Narrative:  [name] is a 87 y.o. female with medical history significant of chronic diastolic CHF, HTN, pancreatic mass, LBBB, hypothyroidism, presented with worsening of shortness of breath.  Patient is a poor historian, she reported she has been having increasing shortness of breath "for several months", but really became worse for the last 2 weeks.  Last 2 days, she was not able to walk even short distance within the room without feeling shortness of breath.  She has had productive cough with clear phlegm and wheezing, for which nurse practitioner at assisted living has been giving her albuterol inhaler, which she has been using 2-3 times a day with some relief.  Denies any fever chills.  She also complains about intermittent left-sided abdominal pain, and for the last few months she has noticed her stool color has turned darker.  She has chronic constipation has been using milk of magnesium every day.  Patient was admitted to the hospital with CHF exacerbation as well as suspected blood loss anemia.  She required BiPAP initially on admission.  Cardiology as well as GI and palliative care medicine were consulted.  New events last 24 hours / Subjective: Patient feeling well, no further episodes of nausea, vomiting. No SOB. Remains on room air. Awaiting SNF placement.   Assessment & Plan:   Principal Problem:   Acute on chronic combined systolic and diastolic congestive heart failure (HCC) Active Problems:   HLD (hyperlipidemia)   Atrial fibrillation (HCC)   GERD   Anxiety and depression   Microcytic anemia   Iron deficiency anemia   CKD (chronic kidney disease) stage 3, GFR 30-59 ml/min (HCC)   Pancreas cyst   Hypothyroid   Aspiration into airway   Palliative care encounter   Acute on chronic systolic and diastolic heart failure History of an  ICM/dilated cardiomyopathy -BNP 786.3 -Cardiology signed off 5/22 -Continue Coreg, spironolactone, losartan, torsemide -Strict I's and O's, daily weight, fluid restriction diet  Paroxysmal atrial fibrillation -Not on anticoagulation candidate due to GI bleed, fall risk -Continue Coreg  Acute microcytic anemia, iron deficiency anemia -Concern for GI bleeding -Iron supplementation  -GI following -Status post 1 unit packed red blood cells -Hold aspirin  -PPI twice daily -Hemoglobin remains stable, in no acute GI blood loss reported  Hyperlipidemia -Continue Zocor  Depression/anxiety -Continue Zoloft, Xanax  CKD stage IIIa -Monitor BMP while on diuretics -Stable  Pancreatic cyst -Likely benign in nature  Hypothyroidism -Continue Synthroid  Aspiration -Chest x-ray 5/21 revealed hazy opacity in the right base -SLP evaluation, recommending dysphagia 3 diet    DVT prophylaxis:  Place and maintain sequential compression device Start: 04/06/21 1402  Code Status:     Code Status Orders  (From admission, onward)         Start     Ordered   04/06/21 1336  Do not attempt resuscitation (DNR)  Continuous       Question Answer Comment  In the event of cardiac or respiratory ARREST Do not call a "code blue"   In the event of cardiac or respiratory ARREST Do not perform Intubation, CPR, defibrillation or ACLS   In the event of cardiac or respiratory ARREST Use medication by any route, position, wound care, and other measures to relive pain and suffering. May use oxygen, suction and manual treatment of airway obstruction as needed for comfort.  Comments Patient would like to be intubated if respiratory arrest but no treatment if cardiac arrest.      04/06/21 1337        Code Status History    Date Active Date Inactive Code Status Order ID Comments User Context   12/09/2017 1825 12/11/2017 1942 DNR [id number]  Pieter Partridge, MD Inpatient   12/09/2017 1449  12/09/2017 1825 Partial Code [id number]  Pieter Partridge, MD ED   04/13/2016 0050 04/17/2016 1806 DNR [id number]  Alberteen Sam, MD Inpatient   11/07/2013 1659 11/10/2013 1534 Full Code [id number]  Renae Fickle, MD Inpatient   05/05/2012 0039 05/07/2012 1456 Full Code [id number]  Terie Purser, RN Inpatient   Advance Care Planning Activity    Advance Directive Documentation   Flowsheet Row Most Recent Value  Type of Advance Directive Healthcare Power of Attorney, Living will  Pre-existing out of facility DNR order (yellow form or pink MOST form) --  "MOST" Form in Place? --     Family Communication: None at bedside, updated niece [name] over the phone Disposition Plan:  Status is: Inpatient  Remains inpatient appropriate because:Unsafe d/c plan   Dispo: The patient is from: ALF              Anticipated d/c is to: SNF              Patient currently is medically stable to d/c.  Await SNF placement   Difficult to place patient No    Consultants:   Cardiology  GI  Palliative care medicine  Antimicrobials:  Anti-infectives (From admission, onward)   Start     Dose/Rate Route Frequency Ordered Stop   04/06/21 1415  doxycycline (VIBRA-TABS) tablet 100 mg  Status:  Discontinued        100 mg Oral Every 12 hours 04/06/21 1400 04/06/21 1912   04/06/21 1215  cefTRIAXone (ROCEPHIN) 1 g in sodium chloride 0.9 % 100 mL IVPB        1 g 200 mL/hr over 30 Minutes Intravenous  Once 04/06/21 1208 04/06/21 1311   04/06/21 1215  azithromycin (ZITHROMAX) tablet 500 mg        500 mg Oral  Once 04/06/21 1208 04/06/21 1220       Objective: Vitals:   04/09/21 0406 04/09/21 0500 04/09/21 0921 04/09/21 1058  BP: 128/62  (!) 143/75 134/67  Pulse: 76  78 74  Resp: 18   18  Temp: 98 F (36.7 C)   98.3 F (36.8 C)  TempSrc:      SpO2: 93%  93% 95%  Weight:  87.4 kg    Height:        Intake/Output Summary (Last 24 hours) at 04/09/2021 1140 Last data filed at 04/09/2021  1108 Gross per 24 hour  Intake 120 ml  Output --  Net 120 ml   Filed Weights   04/08/21 0313 04/09/21 0052 04/09/21 0500  Weight: 86.2 kg 87.4 kg 87.4 kg    Examination: General exam: Appears calm and comfortable  Respiratory system: Clear to auscultation. Respiratory effort normal.  On room air Cardiovascular system: S1 & S2 heard, RRR. No pedal edema. Gastrointestinal system: Abdomen is nondistended, soft and nontender. Normal bowel sounds heard. Central nervous system: Alert and oriented. Non focal exam. Speech clear  Extremities: Symmetric in appearance bilaterally  Skin: No rashes, lesions or ulcers on exposed skin  Psychiatry: Judgement and insight appear stable. Mood & affect appropriate.   Data Reviewed: I have personally reviewed  following labs and imaging studies  CBC: Recent Labs  Lab 04/06/21 1000 04/07/21 0516 04/08/21 0345 04/09/21 0755  WBC 7.8 8.7 10.1 11.1*  NEUTROABS  --  6.4  --   --   HGB 8.8* 11.5* 11.5* 11.7*  HCT 29.3* 35.7* 36.9 38.2  MCV 77.3* 74.7* 76.2* 77.6*  PLT 160 174 193 162   Basic Metabolic Panel: Recent Labs  Lab 04/06/21 1000 04/07/21 0516 04/08/21 0345 04/09/21 0755  NA 139 142 142 141  K 4.3 3.6 3.6 3.4*  CL 108 103 103 101  CO2 24 27 24 27   GLUCOSE 148* 129* 138* 107*  BUN 13 18 24* 36*  CREATININE 0.96 1.25* 1.19* 1.31*  CALCIUM 9.1 9.7 9.2 8.8*  MG  --   --  2.2  --    GFR: Estimated Creatinine Clearance: 29 mL/min (A) (by C-G formula based on SCr of 1.31 mg/dL (H)). Liver Function Tests: No results for input(s): AST, ALT, ALKPHOS, BILITOT, PROT, ALBUMIN in the last 168 hours. Recent Labs  Lab 04/06/21 2137  LIPASE 21   No results for input(s): AMMONIA in the last 168 hours. Coagulation Profile: No results for input(s): INR, PROTIME in the last 168 hours. Cardiac Enzymes: No results for input(s): CKTOTAL, CKMB, CKMBINDEX, TROPONINI in the last 168 hours. BNP (last 3 results) No results for input(s): PROBNP  in the last 8760 hours. HbA1C: No results for input(s): HGBA1C in the last 72 hours. CBG: No results for input(s): GLUCAP in the last 168 hours. Lipid Profile: No results for input(s): CHOL, HDL, LDLCALC, TRIG, CHOLHDL, LDLDIRECT in the last 72 hours. Thyroid Function Tests: Recent Labs    04/06/21 1620  TSH 1.912   Anemia Panel: Recent Labs    04/06/21 1620  FERRITIN 24  TIBC 377  IRON 24*  RETICCTPCT 1.2   Sepsis Labs: No results for input(s): PROCALCITON, LATICACIDVEN in the last 168 hours.  Recent Results (from the past 240 hour(s))  Resp Panel by RT-PCR (Flu A&B, Covid) Nasopharyngeal Swab     Status: None   Collection Time: 04/06/21 10:47 AM   Specimen: Nasopharyngeal Swab; Nasopharyngeal(NP) swabs in vial transport medium  Result Value Ref Range Status   SARS Coronavirus 2 by RT PCR NEGATIVE NEGATIVE Final    Comment: (NOTE) SARS-CoV-2 target nucleic acids are NOT DETECTED.  The SARS-CoV-2 RNA is generally detectable in upper respiratory specimens during the acute phase of infection. The lowest concentration of SARS-CoV-2 viral copies this assay can detect is 138 copies/mL. A negative result does not preclude SARS-Cov-2 infection and should not be used as the sole basis for treatment or other patient management decisions. A negative result may occur with  improper specimen collection/handling, submission of specimen other than nasopharyngeal swab, presence of viral mutation(s) within the areas targeted by this assay, and inadequate number of viral copies(<138 copies/mL). A negative result must be combined with clinical observations, patient history, and epidemiological information. The expected result is Negative.  Fact Sheet for Patients:  04/08/21  Fact Sheet for Healthcare Providers:  BloggerCourse.com  This test is no t yet approved or cleared by the [place] FDA and  has been authorized for  detection and/or diagnosis of SARS-CoV-2 by FDA under an Emergency Use Authorization (EUA). This EUA will remain  in effect (meaning this test can be used) for the duration of the COVID-19 declaration under Section 564(b)(1) of the Act, 21 U.S.C.section 360bbb-3(b)(1), unless the authorization is terminated  or revoked sooner.  Influenza A by PCR NEGATIVE NEGATIVE Final   Influenza B by PCR NEGATIVE NEGATIVE Final    Comment: (NOTE) The Xpert Xpress SARS-CoV-2/FLU/RSV plus assay is intended as an aid in the diagnosis of influenza from Nasopharyngeal swab specimens and should not be used as a sole basis for treatment. Nasal washings and aspirates are unacceptable for Xpert Xpress SARS-CoV-2/FLU/RSV testing.  Fact Sheet for Patients: BloggerCourse.comhttps://www.fda.gov/media/152166/download  Fact Sheet for Healthcare Providers: SeriousBroker.ithttps://www.fda.gov/media/152162/download  This test is not yet approved or cleared by the [place] FDA and has been authorized for detection and/or diagnosis of SARS-CoV-2 by FDA under an Emergency Use Authorization (EUA). This EUA will remain in effect (meaning this test can be used) for the duration of the COVID-19 declaration under Section 564(b)(1) of the Act, 21 U.S.C. section 360bbb-3(b)(1), unless the authorization is terminated or revoked.  Performed at [facility], [address] [place], [place] [address]   MRSA PCR Screening     Status: None   Collection Time: 04/06/21 10:03 PM   Specimen: Nasal Mucosa; Nasopharyngeal  Result Value Ref Range Status   MRSA by PCR NEGATIVE NEGATIVE Final    Comment:        The GeneXpert MRSA Assay (FDA approved for NASAL specimens only), is one component of a comprehensive MRSA colonization surveillance program. It is not intended to diagnose MRSA infection nor to guide or monitor treatment for MRSA infections. Performed at [facility], [address] [place], [place] [address]        Radiology Studies: DG Abd 1 View  Result Date: 04/07/2021 CLINICAL DATA:  Left upper quadrant pain EXAM: ABDOMEN - 1 VIEW COMPARISON:  10/19/2020 FINDINGS: Prior CABG. Nonobstructed bowel gas pattern. No organomegaly, free air or suspicious calcification. Bilateral hip replacements. IMPRESSION: No acute findings. Electronically Signed   By: Charlett NoseKevin  Dover M.D.   On: 04/07/2021 14:28      Scheduled Meds: . carvedilol  6.25 mg Oral BID  . ferrous sulfate  325 mg Oral Q breakfast  . lactobacillus acidophilus & bulgar  1 tablet Oral Daily  . levothyroxine  25 mcg Oral QAC breakfast  . losartan  25 mg Oral Daily  . mirtazapine  30 mg Oral QHS  . pantoprazole  40 mg Oral BID  . potassium chloride  40 mEq Oral Once  . predniSONE  20 mg Oral Q breakfast  . sertraline  100 mg Oral Daily  . simvastatin  40 mg Oral Daily  . sodium chloride flush  3 mL Intravenous Q12H  . spironolactone  12.5 mg Oral Daily  . torsemide  10 mg Oral Daily   Continuous Infusions: . sodium chloride 250 mL (04/06/21 2344)     LOS: 3 days      Time spent: 20 minutes   Noralee StainJennifer Marquon Alcala, DO [facility] 04/09/2021, 11:40 AM   Available via Epic secure chat 7am-7pm After these hours, please refer to coverage provider listed on [email or web address]

## 2021-04-10 DIAGNOSIS — I5043 Acute on chronic combined systolic (congestive) and diastolic (congestive) heart failure: Secondary | ICD-10-CM | POA: Diagnosis not present

## 2021-04-10 LAB — BASIC METABOLIC PANEL
Anion gap: 11 (ref 5–15)
BUN: 34 mg/dL — ABNORMAL HIGH (ref 8–23)
CO2: 26 mmol/L (ref 22–32)
Calcium: 8.9 mg/dL (ref 8.9–10.3)
Chloride: 104 mmol/L (ref 98–111)
Creatinine, Ser: 1.26 mg/dL — ABNORMAL HIGH (ref 0.44–1.00)
GFR, Estimated: 39 mL/min — ABNORMAL LOW (ref >60)
Glucose, Bld: 94 mg/dL (ref 70–99)
Potassium: 3.1 mmol/L — ABNORMAL LOW (ref 3.5–5.1)
Sodium: 141 mmol/L (ref 135–145)

## 2021-04-10 LAB — CBC
HCT: 38.1 % (ref 36.0–46.0)
Hemoglobin: 11.7 g/dL — ABNORMAL LOW (ref 12.0–15.0)
MCH: 23.6 pg — ABNORMAL LOW (ref 26.0–34.0)
MCHC: 30.7 g/dL (ref 30.0–36.0)
MCV: 77 fL — ABNORMAL LOW (ref 80.0–100.0)
Platelets: 189 10*3/uL (ref 150–400)
RBC: 4.95 MIL/uL (ref 3.87–5.11)
RDW: 17.5 % — ABNORMAL HIGH (ref 11.5–15.5)
WBC: 12.8 10*3/uL — ABNORMAL HIGH (ref 4.0–10.5)
nRBC: 0 % (ref 0.0–0.2)

## 2021-04-10 LAB — SARS CORONAVIRUS 2 (TAT 6-24 HRS): SARS Coronavirus 2: NEGATIVE

## 2021-04-10 MED ORDER — POTASSIUM CHLORIDE 20 MEQ PO PACK
40.0000 meq | PACK | Freq: Two times a day (BID) | ORAL | Status: AC
  Administered 2021-04-10 (×2): 40 meq via ORAL
  Filled 2021-04-10 (×2): qty 2

## 2021-04-10 NOTE — TOC Progression Note (Signed)
Transition of Care [facility]) - Progression Note    Patient Details  Name: [name] MRN: [id number] Date of Birth: 08-10-1923  [facility] CM/SW Contact  Ivette Loyal, [place] Phone Number: 04/10/2021, 12:09 PM  Clinical Narrative:    1104: CSW spoke with Whitney at [place], she is able to accept pt with a private room for 40 dollars a day plus insurance. Pt has [employer] and [employer] has started [employer]. Pt may DC tomorrow, CSW requested COVID.   Expected Discharge Plan: Skilled Nursing Facility Barriers to Discharge: SNF Pending bed offer,Continued Medical Work up,Insurance Authorization  Expected Discharge Plan and Services Expected Discharge Plan: Skilled Nursing Facility                                               Social Determinants of Health (SDOH) Interventions    Readmission Risk Interventions No flowsheet data found.

## 2021-04-10 NOTE — Plan of Care (Signed)

## 2021-04-10 NOTE — Progress Notes (Signed)
PROGRESS NOTE    [name]  FUX:[id number] DOB: 16-Sep-1923 DOA: 04/06/2021 PCP: [name], MD     Brief Narrative:  [name] is a 87 y.o. female with medical history significant of chronic diastolic CHF, HTN, pancreatic mass, LBBB, hypothyroidism, presented with worsening of shortness of breath.  Patient is a poor historian, she reported she has been having increasing shortness of breath "for several months", but really became worse for the last 2 weeks.  Last 2 days, she was not able to walk even short distance within the room without feeling shortness of breath.  She has had productive cough with clear phlegm and wheezing, for which nurse practitioner at assisted living has been giving her albuterol inhaler, which she has been using 2-3 times a day with some relief.  Denies any fever chills.  She also complains about intermittent left-sided abdominal pain, and for the last few months she has noticed her stool color has turned darker.  She has chronic constipation has been using milk of magnesium every day.  Patient was admitted to the hospital with CHF exacerbation as well as suspected blood loss anemia.  She required BiPAP initially on admission.  Cardiology as well as GI and palliative care medicine were consulted.  New events last 24 hours / Subjective: A bit confused this morning.  She is oriented but talks with tangential thought, speaks about a female doctor that stayed and helped her overnight.  She states that she feels that she is getting sicker, admits to some nausea, no vomiting, did not eat breakfast today.  Niece and great niece at bedside, they tell me that patient has had some memory issues, dementia at baseline.  However today's episode is not her baseline.  Assessment & Plan:   Principal Problem:   Acute on chronic combined systolic and diastolic congestive heart failure (HCC) Active Problems:   HLD (hyperlipidemia)   Atrial fibrillation  (HCC)   GERD   Anxiety and depression   Microcytic anemia   Iron deficiency anemia   CKD (chronic kidney disease) stage 3, GFR 30-59 ml/min (HCC)   Pancreas cyst   Hypothyroid   Aspiration into airway   Palliative care encounter   Acute on chronic systolic and diastolic heart failure History of an ICM/dilated cardiomyopathy -BNP 786.3 -Cardiology signed off 5/22 -Continue Coreg, spironolactone, losartan, torsemide -Strict I's and O's, daily weight, fluid restriction diet  Paroxysmal atrial fibrillation -Not on anticoagulation candidate due to GI bleed, fall risk -Continue Coreg  Acute microcytic anemia, iron deficiency anemia -Concern for GI bleeding -Iron supplementation  -GI following -Status post 1 unit packed red blood cells -Hold aspirin  -PPI twice daily -Hemoglobin remains stable, in no acute GI blood loss reported  Delirium with underlying dementia -Likely some delirium, related to hospitalization, and setting of underlying dementia -Delirium precaution -Monitor  Hyperlipidemia -Continue Zocor  Depression/anxiety -Continue Zoloft, Xanax  CKD stage IIIa -Monitor BMP while on diuretics -Stable  Pancreatic cyst -Likely benign in nature  Hypothyroidism -Continue Synthroid  Aspiration -Chest x-ray 5/21 revealed hazy opacity in the right base -SLP evaluation, recommending dysphagia 3 diet  Hypokalemia -Replace, trend    DVT prophylaxis:  Place and maintain sequential compression device Start: 04/06/21 1402  Code Status:     Code Status Orders  (From admission, onward)         Start     Ordered   04/06/21 1336  Do not attempt resuscitation (DNR)  Continuous  Question Answer Comment  In the event of cardiac or respiratory ARREST Do not call a "code blue"   In the event of cardiac or respiratory ARREST Do not perform Intubation, CPR, defibrillation or ACLS   In the event of cardiac or respiratory ARREST Use medication by any route,  position, wound care, and other measures to relive pain and suffering. May use oxygen, suction and manual treatment of airway obstruction as needed for comfort.   Comments Patient would like to be intubated if respiratory arrest but no treatment if cardiac arrest.      04/06/21 1337        Code Status History    Date Active Date Inactive Code Status Order ID Comments User Context   12/09/2017 1825 12/11/2017 1942 DNR [id number]  Pieter Partridge, MD Inpatient   12/09/2017 1449 12/09/2017 1825 Partial Code [id number]  Pieter Partridge, MD ED   04/13/2016 0050 04/17/2016 1806 DNR [id number]  Alberteen Sam, MD Inpatient   11/07/2013 1659 11/10/2013 1534 Full Code [id number]  Renae Fickle, MD Inpatient   05/05/2012 0039 05/07/2012 1456 Full Code [id number]  Terie Purser, RN Inpatient   Advance Care Planning Activity    Advance Directive Documentation   Flowsheet Row Most Recent Value  Type of Advance Directive Healthcare Power of Attorney, Living will  Pre-existing out of facility DNR order (yellow form or pink MOST form) --  "MOST" Form in Place? --     Family Communication:  Niece and great niece at bedside today Disposition Plan:  Status is: Inpatient  Remains inpatient appropriate because:Altered mental status and Unsafe d/c plan   Dispo: The patient is from: ALF              Anticipated d/c is to: SNF              Patient currently is not medically stable to d/c.  Some delirium today, continue to monitor.  Await SNF placement  Difficult to place patient No    Consultants:   Cardiology  GI  Palliative care medicine  Antimicrobials:  Anti-infectives (From admission, onward)   Start     Dose/Rate Route Frequency Ordered Stop   04/06/21 1415  doxycycline (VIBRA-TABS) tablet 100 mg  Status:  Discontinued        100 mg Oral Every 12 hours 04/06/21 1400 04/06/21 1912   04/06/21 1215  cefTRIAXone (ROCEPHIN) 1 g in sodium chloride 0.9 % 100 mL IVPB         1 g 200 mL/hr over 30 Minutes Intravenous  Once 04/06/21 1208 04/06/21 1311   04/06/21 1215  azithromycin (ZITHROMAX) tablet 500 mg        500 mg Oral  Once 04/06/21 1208 04/06/21 1220       Objective: Vitals:   04/09/21 2026 04/10/21 0311 04/10/21 0800 04/10/21 1317  BP: 102/70 (!) 151/68  123/75  Pulse: 82 73  90  Resp: 16 16 20 18   Temp: 98.1 F (36.7 C) 97.9 F (36.6 C)  97.9 F (36.6 C)  TempSrc: Oral Oral  Oral  SpO2: 96%   99%  Weight:  88.2 kg    Height:        Intake/Output Summary (Last 24 hours) at 04/10/2021 1350 Last data filed at 04/10/2021 0911 Gross per 24 hour  Intake 240 ml  Output 400 ml  Net -160 ml   Filed Weights   04/09/21 0052 04/09/21 0500 04/10/21 0311  Weight: 87.4 kg 87.4 kg 88.2  kg    Examination: General exam: Appears calm and comfortable  Respiratory system: Clear to auscultation. Respiratory effort normal. Cardiovascular system: S1 & S2 heard, RRR. No pedal edema. Gastrointestinal system: Abdomen is nondistended, soft and nontender. Normal bowel sounds heard. Central nervous system: Alert and oriented. Non focal exam.  Extremities: Symmetric in appearance bilaterally  Skin: No rashes, lesions or ulcers on exposed skin  Psychiatry: Some confusion today  Data Reviewed: I have personally reviewed following labs and imaging studies  CBC: Recent Labs  Lab 04/06/21 1000 04/07/21 0516 04/08/21 0345 04/09/21 0755 04/10/21 0310  WBC 7.8 8.7 10.1 11.1* 12.8*  NEUTROABS  --  6.4  --   --   --   HGB 8.8* 11.5* 11.5* 11.7* 11.7*  HCT 29.3* 35.7* 36.9 38.2 38.1  MCV 77.3* 74.7* 76.2* 77.6* 77.0*  PLT 160 174 193 162 189   Basic Metabolic Panel: Recent Labs  Lab 04/06/21 1000 04/07/21 0516 04/08/21 0345 04/09/21 0755 04/10/21 0310  NA 139 142 142 141 141  K 4.3 3.6 3.6 3.4* 3.1*  CL 108 103 103 101 104  CO2 24 27 24 27 26   GLUCOSE 148* 129* 138* 107* 94  BUN 13 18 24* 36* 34*  CREATININE 0.96 1.25* 1.19* 1.31* 1.26*   CALCIUM 9.1 9.7 9.2 8.8* 8.9  MG  --   --  2.2  --   --    GFR: Estimated Creatinine Clearance: 32.6 mL/min (A) (by C-G formula based on SCr of 1.26 mg/dL (H)). Liver Function Tests: No results for input(s): AST, ALT, ALKPHOS, BILITOT, PROT, ALBUMIN in the last 168 hours. Recent Labs  Lab 04/06/21 2137  LIPASE 21   No results for input(s): AMMONIA in the last 168 hours. Coagulation Profile: No results for input(s): INR, PROTIME in the last 168 hours. Cardiac Enzymes: No results for input(s): CKTOTAL, CKMB, CKMBINDEX, TROPONINI in the last 168 hours. BNP (last 3 results) No results for input(s): PROBNP in the last 8760 hours. HbA1C: No results for input(s): HGBA1C in the last 72 hours. CBG: No results for input(s): GLUCAP in the last 168 hours. Lipid Profile: No results for input(s): CHOL, HDL, LDLCALC, TRIG, CHOLHDL, LDLDIRECT in the last 72 hours. Thyroid Function Tests: No results for input(s): TSH, T4TOTAL, FREET4, T3FREE, THYROIDAB in the last 72 hours. Anemia Panel: No results for input(s): VITAMINB12, FOLATE, FERRITIN, TIBC, IRON, RETICCTPCT in the last 72 hours. Sepsis Labs: No results for input(s): PROCALCITON, LATICACIDVEN in the last 168 hours.  Recent Results (from the past 240 hour(s))  Resp Panel by RT-PCR (Flu A&B, Covid) Nasopharyngeal Swab     Status: None   Collection Time: 04/06/21 10:47 AM   Specimen: Nasopharyngeal Swab; Nasopharyngeal(NP) swabs in vial transport medium  Result Value Ref Range Status   SARS Coronavirus 2 by RT PCR NEGATIVE NEGATIVE Final    Comment: (NOTE) SARS-CoV-2 target nucleic acids are NOT DETECTED.  The SARS-CoV-2 RNA is generally detectable in upper respiratory specimens during the acute phase of infection. The lowest concentration of SARS-CoV-2 viral copies this assay can detect is 138 copies/mL. A negative result does not preclude SARS-Cov-2 infection and should not be used as the sole basis for treatment or other patient  management decisions. A negative result may occur with  improper specimen collection/handling, submission of specimen other than nasopharyngeal swab, presence of viral mutation(s) within the areas targeted by this assay, and inadequate number of viral copies(<138 copies/mL). A negative result must be combined with clinical observations, patient history,  and epidemiological information. The expected result is Negative.  Fact Sheet for Patients:  BloggerCourse.com  Fact Sheet for Healthcare Providers:  SeriousBroker.it  This test is no t yet approved or cleared by the [place] FDA and  has been authorized for detection and/or diagnosis of SARS-CoV-2 by FDA under an Emergency Use Authorization (EUA). This EUA will remain  in effect (meaning this test can be used) for the duration of the COVID-19 declaration under Section 564(b)(1) of the Act, 21 U.S.C.section 360bbb-3(b)(1), unless the authorization is terminated  or revoked sooner.       Influenza A by PCR NEGATIVE NEGATIVE Final   Influenza B by PCR NEGATIVE NEGATIVE Final    Comment: (NOTE) The Xpert Xpress SARS-CoV-2/FLU/RSV plus assay is intended as an aid in the diagnosis of influenza from Nasopharyngeal swab specimens and should not be used as a sole basis for treatment. Nasal washings and aspirates are unacceptable for Xpert Xpress SARS-CoV-2/FLU/RSV testing.  Fact Sheet for Patients: BloggerCourse.com  Fact Sheet for Healthcare Providers: SeriousBroker.it  This test is not yet approved or cleared by the [place] FDA and has been authorized for detection and/or diagnosis of SARS-CoV-2 by FDA under an Emergency Use Authorization (EUA). This EUA will remain in effect (meaning this test can be used) for the duration of the COVID-19 declaration under Section 564(b)(1) of the Act, 21 U.S.C. section 360bbb-3(b)(1),  unless the authorization is terminated or revoked.  Performed at [facility], [address] [place], [place] [address]   MRSA PCR Screening     Status: None   Collection Time: 04/06/21 10:03 PM   Specimen: Nasal Mucosa; Nasopharyngeal  Result Value Ref Range Status   MRSA by PCR NEGATIVE NEGATIVE Final    Comment:        The GeneXpert MRSA Assay (FDA approved for NASAL specimens only), is one component of a comprehensive MRSA colonization surveillance program. It is not intended to diagnose MRSA infection nor to guide or monitor treatment for MRSA infections. Performed at [facility], [address], [place], [place] [address]       Radiology Studies: No results found.    Scheduled Meds: . carvedilol  6.25 mg Oral BID  . ferrous sulfate  325 mg Oral Q breakfast  . lactobacillus acidophilus & bulgar  1 tablet Oral Daily  . levothyroxine  25 mcg Oral QAC breakfast  . losartan  25 mg Oral Daily  . mirtazapine  30 mg Oral QHS  . pantoprazole  40 mg Oral BID  . potassium chloride  40 mEq Oral BID  . sertraline  100 mg Oral Daily  . simvastatin  40 mg Oral Daily  . sodium chloride flush  3 mL Intravenous Q12H  . spironolactone  12.5 mg Oral Daily  . torsemide  10 mg Oral Daily   Continuous Infusions: . sodium chloride 250 mL (04/06/21 2344)     LOS: 4 days      Time spent: 20 minutes   Noralee Stain, DO [facility] 04/10/2021, 1:50 PM   Available via Epic secure chat 7am-7pm After these hours, please refer to coverage provider listed on [email or web address]

## 2021-04-10 NOTE — Progress Notes (Signed)
Physical Therapy Treatment Patient Details Name: [name] MRN: [id number] DOB: 10/01/1923 Today's Date: 04/10/2021    History of Present Illness 87 yo admitted 5/20 with SOB and CHF exacerbation. PMHx: diastolic dysfunction with history of CHF, hypertension, hypothyroidism, LBBB, macular degeneration, and cystic pancreatic mass    PT Comments    Patient progressing slowly towards PT goals. Reports not feeling well today complaining of nausea and being weak. Requires Min A for bed mobility and Mod A to stand from all surfaces. Improved ambulation distance with Min-Mod A and use of RW for support. Noted to have bil knee instability throughout gait worsening with increased distance needing therapist support to prevent buckling. Poor ability to self monitor for symptoms and need for rest break. Continues to be high fall risk and appropriate for SNF. Will follow.    Follow Up Recommendations  SNF;Supervision for mobility/OOB     Equipment Recommendations  Hospital bed;3in1 (PT)    Recommendations for Other Services       Precautions / Restrictions Precautions Precautions: Fall Restrictions Weight Bearing Restrictions: No    Mobility  Bed Mobility Overal bed mobility: Needs Assistance Bed Mobility: Supine to Sit     Supine to sit: Min assist;HOB elevated     General bed mobility comments: Increased time/effort with use of rail and assist with trunk to scoot to EOB.    Transfers Overall transfer level: Needs assistance Equipment used: Rolling walker (2 wheeled) Transfers: Sit to/from Stand Sit to Stand: Mod assist         General transfer comment: Mod A to power to standing with cues for hand placement/technique. Difficulty in mid range to extend knees and get fully upright. Stood from [employer], from chair x1, transferred to chair post ambulation.  Ambulation/Gait Ambulation/Gait assistance: Min assist;Mod assist Gait Distance (Feet): 28 Feet (+  18') Assistive device: Rolling walker (2 wheeled) Gait Pattern/deviations: Step-through pattern;Decreased stride length;Narrow base of support;Trunk flexed Gait velocity: decreased   General Gait Details: Slow, unsteady gait with bil knee instability needing assist to stabilize knees at times during stance phase; cues to widen BoS. 1 seated rest break. Pt needs cues to self monitor for worsening weakness and needing to sit at an appropriate time. Assist with RW management.   Stairs             Wheelchair Mobility    Modified Rankin (Stroke Patients Only)       Balance Overall balance assessment: Needs assistance Sitting-balance support: Feet supported;No upper extremity supported Sitting balance-Leahy Scale: Fair     Standing balance support: During functional activity Standing balance-Leahy Scale: Poor Standing balance comment: Requires UE support ins tanding.                            Cognition Arousal/Alertness: Awake/alert Behavior During Therapy: WFL for tasks assessed/performed;Impulsive Overall Cognitive Status: Impaired/Different from baseline Area of Impairment: Memory;Safety/judgement;Awareness                     Memory: Decreased short-term memory   Safety/Judgement: Decreased awareness of safety;Decreased awareness of deficits Awareness: Intellectual   General Comments: Repeating self a lot during session. Slightly impulsive.      Exercises      General Comments General comments (skin integrity, edema, etc.): Family present during session, trying to get pt to eat some lunch.      Pertinent Vitals/Pain Pain Assessment: No/denies pain    Home Living  Prior Function            PT Goals (current goals can now be found in the care plan section) Progress towards PT goals: Progressing toward goals    Frequency    Min 3X/week      PT Plan Current plan remains appropriate     Co-evaluation              AM-PAC PT "6 Clicks" Mobility   Outcome Measure  Help needed turning from your back to your side while in a flat bed without using bedrails?: A Little Help needed moving from lying on your back to sitting on the side of a flat bed without using bedrails?: A Little Help needed moving to and from a bed to a chair (including a wheelchair)?: A Lot Help needed standing up from a chair using your arms (e.g., wheelchair or bedside chair)?: A Lot Help needed to walk in hospital room?: A Little Help needed climbing 3-5 steps with a railing? : Total 6 Click Score: 14    End of Session Equipment Utilized During Treatment: Gait belt Activity Tolerance: Patient tolerated treatment well Patient left: in chair;with call bell/phone within reach;with family/visitor present;with chair alarm set Nurse Communication: Mobility status PT Visit Diagnosis: Other abnormalities of gait and mobility (R26.89);Muscle weakness (generalized) (M62.81);Difficulty in walking, not elsewhere classified (R26.2)     Time: 1207-1227 PT Time Calculation (min) (ACUTE ONLY): 20 min  Charges:  $Gait Training: 8-22 mins                     Vale Haven, PT, DPT Acute Rehabilitation Services Pager [phone] Office [phone]       Blake Divine A Lanier Ensign 04/10/2021, 1:25 PM

## 2021-04-10 NOTE — Care Management Important Message (Signed)
Important Message  Patient Details  Name: [name] MRN: [id number] Date of Birth: 08-31-1923   Medicare Important Message Given:  Yes     Renie Ora 04/10/2021, 9:39 AM

## 2021-04-11 ENCOUNTER — Telehealth: Payer: Self-pay | Admitting: Cardiovascular Disease

## 2021-04-11 DIAGNOSIS — F32A Depression, unspecified: Secondary | ICD-10-CM | POA: Diagnosis not present

## 2021-04-11 DIAGNOSIS — I480 Paroxysmal atrial fibrillation: Secondary | ICD-10-CM | POA: Diagnosis not present

## 2021-04-11 DIAGNOSIS — J45909 Unspecified asthma, uncomplicated: Secondary | ICD-10-CM | POA: Diagnosis not present

## 2021-04-11 DIAGNOSIS — N183 Chronic kidney disease, stage 3 unspecified: Secondary | ICD-10-CM | POA: Diagnosis not present

## 2021-04-11 DIAGNOSIS — R296 Repeated falls: Secondary | ICD-10-CM | POA: Diagnosis not present

## 2021-04-11 DIAGNOSIS — K5900 Constipation, unspecified: Secondary | ICD-10-CM | POA: Diagnosis not present

## 2021-04-11 DIAGNOSIS — F0390 Unspecified dementia without behavioral disturbance: Secondary | ICD-10-CM | POA: Diagnosis not present

## 2021-04-11 DIAGNOSIS — I255 Ischemic cardiomyopathy: Secondary | ICD-10-CM | POA: Diagnosis not present

## 2021-04-11 DIAGNOSIS — R0981 Nasal congestion: Secondary | ICD-10-CM | POA: Diagnosis not present

## 2021-04-11 DIAGNOSIS — M810 Age-related osteoporosis without current pathological fracture: Secondary | ICD-10-CM | POA: Diagnosis not present

## 2021-04-11 DIAGNOSIS — D649 Anemia, unspecified: Secondary | ICD-10-CM | POA: Diagnosis not present

## 2021-04-11 DIAGNOSIS — I5043 Acute on chronic combined systolic (congestive) and diastolic (congestive) heart failure: Secondary | ICD-10-CM | POA: Diagnosis not present

## 2021-04-11 DIAGNOSIS — J029 Acute pharyngitis, unspecified: Secondary | ICD-10-CM | POA: Diagnosis not present

## 2021-04-11 DIAGNOSIS — I110 Hypertensive heart disease with heart failure: Secondary | ICD-10-CM | POA: Diagnosis not present

## 2021-04-11 DIAGNOSIS — J3489 Other specified disorders of nose and nasal sinuses: Secondary | ICD-10-CM | POA: Diagnosis not present

## 2021-04-11 DIAGNOSIS — I130 Hypertensive heart and chronic kidney disease with heart failure and stage 1 through stage 4 chronic kidney disease, or unspecified chronic kidney disease: Secondary | ICD-10-CM | POA: Diagnosis not present

## 2021-04-11 DIAGNOSIS — R5381 Other malaise: Secondary | ICD-10-CM | POA: Diagnosis not present

## 2021-04-11 DIAGNOSIS — E1142 Type 2 diabetes mellitus with diabetic polyneuropathy: Secondary | ICD-10-CM | POA: Diagnosis not present

## 2021-04-11 DIAGNOSIS — E1122 Type 2 diabetes mellitus with diabetic chronic kidney disease: Secondary | ICD-10-CM | POA: Diagnosis not present

## 2021-04-11 DIAGNOSIS — F331 Major depressive disorder, recurrent, moderate: Secondary | ICD-10-CM | POA: Diagnosis not present

## 2021-04-11 DIAGNOSIS — E785 Hyperlipidemia, unspecified: Secondary | ICD-10-CM | POA: Diagnosis not present

## 2021-04-11 DIAGNOSIS — I5022 Chronic systolic (congestive) heart failure: Secondary | ICD-10-CM | POA: Diagnosis not present

## 2021-04-11 DIAGNOSIS — I5030 Unspecified diastolic (congestive) heart failure: Secondary | ICD-10-CM | POA: Diagnosis not present

## 2021-04-11 DIAGNOSIS — F411 Generalized anxiety disorder: Secondary | ICD-10-CM | POA: Diagnosis not present

## 2021-04-11 DIAGNOSIS — I5032 Chronic diastolic (congestive) heart failure: Secondary | ICD-10-CM | POA: Diagnosis not present

## 2021-04-11 DIAGNOSIS — F5102 Adjustment insomnia: Secondary | ICD-10-CM | POA: Diagnosis not present

## 2021-04-11 DIAGNOSIS — R531 Weakness: Secondary | ICD-10-CM | POA: Diagnosis not present

## 2021-04-11 DIAGNOSIS — D509 Iron deficiency anemia, unspecified: Secondary | ICD-10-CM | POA: Diagnosis not present

## 2021-04-11 DIAGNOSIS — E039 Hypothyroidism, unspecified: Secondary | ICD-10-CM | POA: Diagnosis not present

## 2021-04-11 LAB — GLUCOSE, CAPILLARY
Glucose-Capillary: 150 mg/dL — ABNORMAL HIGH (ref 70–99)
Glucose-Capillary: 158 mg/dL — ABNORMAL HIGH (ref 70–99)

## 2021-04-11 LAB — CBC
HCT: 35.9 % — ABNORMAL LOW (ref 36.0–46.0)
Hemoglobin: 11.1 g/dL — ABNORMAL LOW (ref 12.0–15.0)
MCH: 24 pg — ABNORMAL LOW (ref 26.0–34.0)
MCHC: 30.9 g/dL (ref 30.0–36.0)
MCV: 77.5 fL — ABNORMAL LOW (ref 80.0–100.0)
Platelets: 164 K/uL (ref 150–400)
RBC: 4.63 MIL/uL (ref 3.87–5.11)
RDW: 18.3 % — ABNORMAL HIGH (ref 11.5–15.5)
WBC: 10.9 K/uL — ABNORMAL HIGH (ref 4.0–10.5)
nRBC: 0 % (ref 0.0–0.2)

## 2021-04-11 LAB — BASIC METABOLIC PANEL WITH GFR
Anion gap: 10 (ref 5–15)
BUN: 37 mg/dL — ABNORMAL HIGH (ref 8–23)
CO2: 26 mmol/L (ref 22–32)
Calcium: 9 mg/dL (ref 8.9–10.3)
Chloride: 107 mmol/L (ref 98–111)
Creatinine, Ser: 1.33 mg/dL — ABNORMAL HIGH (ref 0.44–1.00)
GFR, Estimated: 37 mL/min — ABNORMAL LOW
Glucose, Bld: 129 mg/dL — ABNORMAL HIGH (ref 70–99)
Potassium: 3.7 mmol/L (ref 3.5–5.1)
Sodium: 143 mmol/L (ref 135–145)

## 2021-04-11 LAB — MAGNESIUM: Magnesium: 2.5 mg/dL — ABNORMAL HIGH (ref 1.7–2.4)

## 2021-04-11 MED ORDER — LOSARTAN POTASSIUM 50 MG PO TABS: 25.0000 mg | ORAL_TABLET | Freq: Every day | ORAL | Status: AC

## 2021-04-11 MED ORDER — TORSEMIDE 10 MG PO TABS: 10.0000 mg | ORAL_TABLET | Freq: Every day | ORAL | Status: AC

## 2021-04-11 MED ORDER — FERROUS SULFATE 325 (65 FE) MG PO TABS: 325.0000 mg | ORAL_TABLET | Freq: Every day | ORAL | 3 refills | Status: AC

## 2021-04-11 MED ORDER — PANTOPRAZOLE SODIUM 40 MG PO TBEC: 40.0000 mg | DELAYED_RELEASE_TABLET | Freq: Two times a day (BID) | ORAL | Status: AC

## 2021-04-11 MED ORDER — ALPRAZOLAM 1 MG PO TABS: 1.0000 mg | ORAL_TABLET | Freq: Three times a day (TID) | ORAL | 0 refills | Status: DC | PRN

## 2021-04-11 MED ORDER — MIRTAZAPINE 30 MG PO TABS: 30.0000 mg | ORAL_TABLET | Freq: Every day | ORAL | 0 refills | Status: AC

## 2021-04-11 MED ORDER — SERTRALINE HCL 100 MG PO TABS
100.0000 mg | ORAL_TABLET | Freq: Every day | ORAL | 0 refills | Status: AC
Start: 2021-04-11 — End: ?

## 2021-04-11 NOTE — Telephone Encounter (Signed)
NURSE ROBERT RETURNING CALL ON BEHALF OF PT, JUST RECEIVED PT FROM THE HOSPITAL THINKS SOMEONE WAS CALLING TO REPORT, PROVIDED NURSES CALLBACK  # [phone]

## 2021-04-11 NOTE — Hospital Course (Signed)
  Clinical Narrative:    1104: CSW spoke with Whitney at [facility], she is able to accept pt with a private room for 40 dollars a day plus insurance. Pt has [employer] and [facility] has started [employer]. Pt may DC tomorrow, CSW requested COVID.

## 2021-04-11 NOTE — Progress Notes (Signed)
Palliative:  I discussed with CSW who is helping them to pursue short term rehab at [facility]. There was previous conversation regarding hospice but this would not be an option until after rehab. I recommend outpatient palliative to follow at [facility] to assist with making transition to hospice at appropriate time.   No charge  Yong Channel, NP Palliative Medicine Team Pager [phone] (Please see [email or web address] for schedule) Team Phone [phone]

## 2021-04-11 NOTE — Progress Notes (Signed)
This RN attempted to call report to receiving facility twice, no answer. Left a message with call back number.

## 2021-04-11 NOTE — Progress Notes (Signed)
Patient discharged from unit with all belongings in care of EMS transport to [facility] nursing facility. Discharge information and materials provided to [facility] staff, no issues or concerns voiced at this time. Transport staff instructed to ask facility staff to call this RN for report on patient as previous attempts to call report were unsuccessful.

## 2021-04-11 NOTE — Progress Notes (Signed)
D/C instructions printed and placed in packet at nurse's station. Tele removed. Awaiting [facility] transport.

## 2021-04-11 NOTE — TOC Transition Note (Addendum)
Transition of Care [facility]) - CM/SW Discharge Note   Patient Details  Name: SHIFRA SWARTZENTRUBER MRN: [id number] Date of Birth: [birth year]  Transition of Care [facility]) CM/SW Contact:  Lynett Grimes Phone Number: 04/11/2021, 10:20 AM   Clinical Narrative:    Patient will DC to: [facility] Anticipated DC date: 04/11/2021 Family notified: Pt Niece Transport by: Sharin Mons   Per MD patient ready for DC to [facility] room 7009 . RN to call report prior to discharge [phone]). RN, patient, patient's family, and facility notified of DC. Discharge Summary and FL2 sent to facility. DC packet on chart. Ambulance transport requested for patient.   CSW will sign off for now as social work intervention is no longer needed. Please consult [place] again if new needs arise.        Barriers to Discharge: SNF Pending bed offer,Continued Medical Work up,Insurance Authorization   Patient Goals and CMS Choice Patient states their goals for this hospitalization and ongoing recovery are:: To get better CMS Medicare.gov Compare Post Acute Care list provided to:: Patient Choice offered to / list presented to : Patient  Discharge Placement                       Discharge Plan and Services                                     Social Determinants of Health (SDOH) Interventions     Readmission Risk Interventions No flowsheet data found.

## 2021-04-11 NOTE — Plan of Care (Signed)

## 2021-04-11 NOTE — Discharge Summary (Signed)
Physician Discharge Summary  [name]:[id number] DOB: [birth year] DOA: 04/06/2021  PCP: [name], MD  Admit date: 04/06/2021 Discharge date: 04/11/2021  Time spent: 35 minutes  Recommendations for Outpatient Follow-up:  1. Recommend continued goals of care in the outpatient setting 2. Would check labs periodically given intent of care being possibly palliative 3. Consider [facility] outpatient setting regarding longitudinal follow-up of dementia 4. Discharging to skilled facility  Discharge Diagnoses:  MAIN problem for hospitalization   Acute exacerbation of HFpEF Component of blood loss anemia unclear etiology at discharge  Please see below for itemized issues addressed in HOpsital- refer to other progress notes for clarity if needed  Discharge Condition: Fair  Diet recommendation: Regular thin liquids  Filed Weights   04/09/21 0500 04/10/21 0311 04/11/21 0500  Weight: 87.4 kg 88.2 kg 86.9 kg    History of present illness:  72 white female known history HFpEF HTN benign pancreatic mass Chronic LBBB, hypothyroid Admit 04/06/2021 SOB X several weeks history unclear however Also seen by NP at assisted living prior to admission with productive cough Also had chronic constipation and?  Dark stools In ED found to have bilateral infiltrates Hemoglobin down from 13-8  Cardiology GI palliative all consulted  Admitted for acute heart failure exacerbation requiring BiPAP on admission  Hospital Course:  HFpEF acute component of systolic and diastolic failure with history of ICM dilated cardiomyopathy Cardiology saw patient and signed off 5/22 Continuing meds as per [facility] including Coreg Aldactone losartan torsemide Restrict fluid as possible in outpatient setting Periodic labs as per nursing home Paroxysmal A. fib CHADS2 score >4 has bled score >3 Continue Coreg-no anticoagulation 2/2 fall and iron deficiency of unknown etiology ?  Aspiration Seen by speech  therapy initially recommended dysphagia 3-graduated to regular diet on discharge Received 1 dose of antibiotics 5/20 No fever curve no significant white count therefore did not cover for aspiration has felt more in keeping with heart failure diagnosis ?  GI bleed with microcytic anemia Seen by gastroenterology Dr. Dulce Sellar 5/21-differential?  CHF/PUD/GERD/viral illness Felt labs might of been spuriously-given age and comorbidities-it was not felt that she was a great candidate for EGD Periodic labs in outpatient setting Hospital-acquired delirium superimposed on mild dementia Outpatient follow-up Depression/anxiety Attempt an outpatient to de-escalate off of Xanax-May cause worsening confusion Continue Zoloft on discharge History of pancreatic mass, hyperlipidemia, CKD 3 AA-all stable during this hospital stay    Discharge Exam: Vitals:   04/11/21 0305 04/11/21 0825  BP: 131/77 (!) 144/62  Pulse: 74   Resp: 16 17  Temp: 98 F (36.7 C) 98.8 F (37.1 C)  SpO2: 95% 94%    Subj on day of d/c   Awake coherent pleasant can tell me date time Seemed concerned about her prognosis-she wonders if she will die soon No overt chest pain No nausea no vomiting Tells me he had orange juice coffee this morning   General Exam on discharge  EOMI NCAT looking younger than stated age no icterus no pallor Chest clear no rales no rhonchi S1-S2 RRR Exam-on monitors PVCs with first-degree AV block Power 5/5 ROM intact no lower extremity edema  Discharge Instructions   Discharge Instructions    Diet - low sodium heart healthy   Complete by: As directed    Increase activity slowly   Complete by: As directed      Allergies as of 04/11/2021      Reactions   Fosamax [alendronate Sodium] Swelling   Penicillins Swelling, Other (  See Comments)   Swelling, redness and warmth at injection site Has patient had a PCN reaction causing immediate rash, facial/tongue/throat swelling, SOB or  lightheadedness with hypotension: No Has patient had a PCN reaction causing severe rash involving mucus membranes or skin necrosis: No Has patient had a PCN reaction that required hospitalization: No Has patient had a PCN reaction occurring within the last 10 years: No If all of the above answers are "NO", then may proceed with Cephalosporin use.   Sulfonamide Derivatives Swelling   Tetanus Toxoid Swelling      Medication List    STOP taking these medications   aspirin 81 MG chewable tablet   hydrOXYzine 10 MG tablet Commonly known as: ATARAX/VISTARIL   loperamide 2 MG tablet Commonly known as: IMODIUM A-D   magnesium hydroxide 400 MG/5ML suspension Commonly known as: MILK OF MAGNESIA   ondansetron 4 MG tablet Commonly known as: ZOFRAN   polyethylene glycol 17 g packet Commonly known as: MIRALAX / GLYCOLAX   PRESERVISION AREDS PO     TAKE these medications   acetaminophen 500 MG tablet Commonly known as: TYLENOL Take 500 mg by mouth every evening.   ALPRAZolam 1 MG tablet Commonly known as: XANAX Take 1 tablet (1 mg total) by mouth 3 (three) times daily as needed for anxiety.   benzonatate 100 MG capsule Commonly known as: TESSALON Take 100 mg by mouth 3 (three) times daily as needed for cough.   carvedilol 6.25 MG tablet Commonly known as: COREG Take 6.25 mg by mouth 2 (two) times daily.   colestipol 1 g tablet Commonly known as: COLESTID Take 2 tablets (2 g total) by mouth daily. Hold FOR CONSTIPATION   Desitin 13 % Crea Generic drug: Zinc Oxide Apply 1 application topically See admin instructions. Cleanse groin area under breast with soap and water, pat dry and apply ointment on area as needed for redness.   ferrous sulfate 325 (65 FE) MG tablet Take 1 tablet (325 mg total) by mouth daily with breakfast. Start taking on: Apr 12, 2021   guaifenesin 100 MG/5ML syrup Commonly known as: ROBITUSSIN Take 100 mg by mouth every 8 (eight) hours as needed for  cough.   guaiFENesin 600 MG 12 hr tablet Commonly known as: MUCINEX Take 600 mg by mouth 2 (two) times daily.   lactobacillus acidophilus & bulgar chewable tablet Chew 1 tablet by mouth daily.   levothyroxine 25 MCG tablet Commonly known as: SYNTHROID Take 25 mcg by mouth daily before breakfast.   losartan 50 MG tablet Commonly known as: COZAAR Take 0.5 tablets (25 mg total) by mouth daily.   mirtazapine 30 MG tablet Commonly known as: REMERON Take 1 tablet (30 mg total) by mouth at bedtime.   Myrbetriq 25 MG Tb24 tablet Generic drug: mirabegron ER Take 25 mg by mouth daily.   omeprazole 20 MG capsule Commonly known as: PRILOSEC Take 20 mg by mouth daily.   pantoprazole 40 MG tablet Commonly known as: PROTONIX Take 1 tablet (40 mg total) by mouth 2 (two) times daily.   sertraline 100 MG tablet Commonly known as: ZOLOFT Take 1 tablet (100 mg total) by mouth daily.   simvastatin 40 MG tablet Commonly known as: ZOCOR Take 40 mg by mouth daily.   spironolactone 25 MG tablet Commonly known as: ALDACTONE Take 0.5 tablets (12.5 mg total) by mouth daily.   torsemide 10 MG tablet Commonly known as: DEMADEX Take 1 tablet (10 mg total) by mouth daily. Start taking on: Apr 12, 2021 What changed:   medication strength  how much to take  when to take this  reasons to take this   triamcinolone cream 0.1 % Commonly known as: KENALOG 1 application 2 (two) times daily as needed (itching). perineum      Allergies  Allergen Reactions  . Fosamax [Alendronate Sodium] Swelling  . Penicillins Swelling and Other (See Comments)    Swelling, redness and warmth at injection site Has patient had a PCN reaction causing immediate rash, facial/tongue/throat swelling, SOB or lightheadedness with hypotension: No Has patient had a PCN reaction causing severe rash involving mucus membranes or skin necrosis: No Has patient had a PCN reaction that required hospitalization: No Has  patient had a PCN reaction occurring within the last 10 years: No If all of the above answers are "NO", then may proceed with Cephalosporin use.  . Sulfonamide Derivatives Swelling  . Tetanus Toxoid Swelling    Contact information for after-discharge care    Destination    [facility] .   Service: Skilled Nursing Contact information: [address] [place] [address] [phone]                   The results of significant diagnostics from this hospitalization (including imaging, microbiology, ancillary and laboratory) are listed below for reference.    Significant Diagnostic Studies: CT ABDOMEN PELVIS WO CONTRAST  Result Date: 04/06/2021 CLINICAL DATA:  Abdominal pain EXAM: CT ABDOMEN AND PELVIS WITHOUT CONTRAST TECHNIQUE: Multidetector CT imaging of the abdomen and pelvis was performed following the standard protocol without IV contrast. COMPARISON:  01/28/2018 FINDINGS: Lower chest: Lung bases demonstrate mild pleural effusions bilaterally right slightly greater than left. Some mild associated atelectasis is noted. Hepatobiliary: No focal liver abnormality is seen. Status post cholecystectomy. No biliary dilatation. Pancreas: Fatty interdigitation of the pancreas is noted. Previously seen cystic lesions are again identified and stable. Spleen: Normal in size without focal abnormality. Adrenals/Urinary Tract: Adrenal glands are unremarkable. Kidneys are well visualized bilaterally without renal calculi or obstructive changes. The ureters show no renal calculi. Multiple cysts are noted within the right kidney similar to that seen on the prior exam. The largest of these is noted anteriorly measuring approximately 5.3 cm. Bladder is well distended. Stomach/Bowel: Scattered diverticular change of the colon is noted. No evidence of diverticulitis is seen. The appendix is not well visualized. No inflammatory changes to suggest appendicitis  are noted. Small bowel and stomach appear within normal limits. Vascular/Lymphatic: Atherosclerotic calcifications of the abdominal aorta are seen. No aneurysmal dilatation is noted. No sizable adenopathy is seen. Reproductive: Uterus and bilateral adnexa are unremarkable. Other: No abdominal wall hernia or abnormality. No abdominopelvic ascites. Musculoskeletal: Bilateral hip replacements are seen. Degenerative changes of lumbar spine are noted. No other focal abnormality is seen. IMPRESSION: Small bilateral pleural effusions right greater than left with associated atelectatic changes. Stable cystic lesions within the pancreas. Given their long-term stability they are felt to be benign in etiology. Diverticulosis without diverticulitis. Electronically Signed   By: Alcide Clever M.D.   On: 04/06/2021 15:45   DG Abd 1 View  Result Date: 04/07/2021 CLINICAL DATA:  Left upper quadrant pain EXAM: ABDOMEN - 1 VIEW COMPARISON:  10/19/2020 FINDINGS: Prior CABG. Nonobstructed bowel gas pattern. No organomegaly, free air or suspicious calcification. Bilateral hip replacements. IMPRESSION: No acute findings. Electronically Signed   By: Charlett Nose M.D.   On: 04/07/2021 14:28   DG CHEST PORT 1 VIEW  Result Date: 04/07/2021 CLINICAL DATA:  Aspiration into airway. EXAM: PORTABLE CHEST 1 VIEW COMPARISON:  Apr 06, 2021 FINDINGS: There is hazy opacity in the right base. There may be tiny bilateral effusions. Minimal atelectasis in the left base. The cardiomediastinal silhouette is stable. No pneumothorax. No other abnormalities. IMPRESSION: 1. Hazy opacity in the right base could represent effusion and underlying atelectasis, developing infiltrate such as pneumonia, or aspiration. 2. Possible tiny left effusion with underlying atelectasis. Electronically Signed   By: Gerome Sam III M.D   On: 04/07/2021 10:35   DG Chest Portable 1 View  Result Date: 04/06/2021 CLINICAL DATA:  Shortness of breath, cough EXAM:  PORTABLE CHEST 1 VIEW COMPARISON:  10/19/2020 FINDINGS: Heart is normal size. Femoral heel thickening and increased interstitial markings particularly in the lung bases. No effusions. No acute bony abnormality. IMPRESSION: Peribronchial thickening and interstitial prominence in the lower lobes could reflect bronchitis or viral/atypical pneumonia. Less likely edema given the normal heart size. Electronically Signed   By: Charlett Nose M.D.   On: 04/06/2021 10:52   ECHOCARDIOGRAM COMPLETE  Result Date: 04/06/2021    ECHOCARDIOGRAM REPORT   Patient Name:   DIETRA STOKELY Date of Exam: 04/06/2021 Medical Rec #:  [id number]            Height:       70.0 in Accession #:    [id number]           Weight:       220.0 lb Date of Birth:  01-03-1923            BSA:          2.174 m Patient Age:    87 years             BP:           155/90 mmHg Patient Gender: F                    HR:           95 bpm. Exam Location:  Inpatient Procedure: 2D Echo, Cardiac Doppler and Color Doppler Indications:     I50.31 Acute diastolic (congestive) heart failure  History:         Patient has prior history of Echocardiogram examinations, most                  recent 04/14/2016. 87 year-old female. nausea.  Sonographer:     Roosvelt Maser Referring Phys:  [id number] Emeline General Diagnosing Phys: Laurance Flatten MD IMPRESSIONS  1. Left ventricular ejection fraction, by estimation, is 30 to 35%. The left ventricle has moderately decreased function. Wall motion difficult to assess due to lack of definity contrast and incomplete visualization of the LV endocardium. Based on limited views, all mid-to-apical septal, apical inferior, mid-to-apcial anterior, and apex appear akinetic. Findings concerning for possible LAD disease. There is moderate asymmetric hypertrophy of the basal septum. The rest of the LV segments demonstrate mild concentric left ventricular hypertrophy. Indeterminate diastolic filling due to E-A fusion.  2. Right ventricular  systolic function is normal. The right ventricular size is normal.  3. The mitral valve is grossly normal. There is mild thickening of the mitral valve leaflet(s). There is mild calcification of the mitral valve leaflet(s). Mild to moderate mitral annular calcification. Trivial mitral valve regurgitation. No evidence of  mitral stenosis.  4. The aortic valve was not well visualized. Aortic valve regurgitation is not visualized.  5. There is  flow acceleration noted at the level of the pulmonic valve (poor visualization of the valve) with mean gradient , peak gradient , Vmax 2.72m/s. Either mild PS or flow acceleration due to vigorous RV systolic function. Comparison(s): Compared to prior echo report, the LVEF is now moderately reduced to 30-35% (previously 60-65%) with wall motion abnormalities as detailed above. FINDINGS  Left Ventricle: Left ventricular ejection fraction, by estimation, is 30 to 35%. The left ventricle has moderately decreased function. Wall motion difficult to assess due to lack of definity contrast and incomplete visualization of the LV endocardium. Based on limited views, all mid-to-apical septal, apical inferior, mid-to-apcial anterior, and apex appear akinetic. Findings consistent with LAD territory. The left ventricular internal cavity size was normal in size. There is moderate asymmetric hypertrophy of the basal septum. The rest of the LV segments demonstrate mild concentric left ventricular hypertrophy. Indeterminate diastolic filling due to E-A fusion. Right Ventricle: The right ventricular size is normal. No increase in right ventricular wall thickness. Right ventricular systolic function is normal. Left Atrium: Left atrial size was normal in size. Right Atrium: Right atrial size was normal in size. Pericardium: There is no evidence of pericardial effusion. Mitral Valve: The mitral valve is grossly normal. There is mild thickening of the mitral valve leaflet(s). There is mild  calcification of the mitral valve leaflet(s). Mild to moderate mitral annular calcification. Trivial mitral valve regurgitation. No evidence of mitral valve stenosis. Tricuspid Valve: The tricuspid valve is normal in structure. Tricuspid valve regurgitation is trivial. Aortic Valve: The aortic valve was not well visualized. Aortic valve regurgitation is not visualized. Pulmonic Valve: The pulmonic valve was not well visualized. Pulmonic valve regurgitation is trivial. Aorta: The aortic root is normal in size and structure. Pulmonary Artery: There is flow acceleration noted at the level of the pulmonic valve (poor visualization of the valve) with mean gradient , peak gradient , Vmax 2.26m/s. Either mild PS or flow acceleration due to vigorous RV systolic function. IAS/Shunts: No atrial level shunt detected by color flow Doppler.  LEFT VENTRICLE PLAX 2D LVIDd:         3.20 cm LVIDs:         2.50 cm LV PW:         1.40 cm LV IVS:        1.50 cm LVOT diam:     1.80 cm LV SV:         44 LV SV Index:   20 LVOT Area:     2.54 cm  LV Volumes (MOD) LV vol d, MOD A4C: 61.6 ml LV vol s, MOD A4C: 42.6 ml LV SV MOD A4C:     61.6 ml LEFT ATRIUM             Index LA diam:        3.25 cm 1.50 cm/m LA Vol (A2C):   43.9 ml 20.20 ml/m LA Vol (A4C):   51.0 ml 23.46 ml/m LA Biplane Vol: 46.8 ml 21.53 ml/m  AORTIC VALVE             PULMONIC VALVE LVOT Vmax:   94.60 cm/s  PV Vmax:       2.63 m/s LVOT Vmean:  60.400 cm/s PV Vmean:      158.000 cm/s LVOT VTI:    0.172 m     PV VTI:        0.421 m  PV Peak grad:  27.7 mmHg AORTA                    PV Mean grad:  12.0 mmHg Ao Root diam: 3.10 cm   SHUNTS Systemic VTI:  0.17 m Systemic Diam: 1.80 cm Laurance Flatten MD Electronically signed by Laurance Flatten MD Signature Date/Time: 04/06/2021/6:53:25 PM    Final (Updated)     Microbiology: Recent Results (from the past 240 hour(s))  Resp Panel by RT-PCR (Flu A&B, Covid) Nasopharyngeal Swab      Status: None   Collection Time: 04/06/21 10:47 AM   Specimen: Nasopharyngeal Swab; Nasopharyngeal(NP) swabs in vial transport medium  Result Value Ref Range Status   SARS Coronavirus 2 by RT PCR NEGATIVE NEGATIVE Final    Comment: (NOTE) SARS-CoV-2 target nucleic acids are NOT DETECTED.  The SARS-CoV-2 RNA is generally detectable in upper respiratory specimens during the acute phase of infection. The lowest concentration of SARS-CoV-2 viral copies this assay can detect is 138 copies/mL. A negative result does not preclude SARS-Cov-2 infection and should not be used as the sole basis for treatment or other patient management decisions. A negative result may occur with  improper specimen collection/handling, submission of specimen other than nasopharyngeal swab, presence of viral mutation(s) within the areas targeted by this assay, and inadequate number of viral copies(<138 copies/mL). A negative result must be combined with clinical observations, patient history, and epidemiological information. The expected result is Negative.  Fact Sheet for Patients:  BloggerCourse.com  Fact Sheet for Healthcare Providers:  SeriousBroker.it  This test is no t yet approved or cleared by the [place] FDA and  has been authorized for detection and/or diagnosis of SARS-CoV-2 by FDA under an Emergency Use Authorization (EUA). This EUA will remain  in effect (meaning this test can be used) for the duration of the COVID-19 declaration under Section 564(b)(1) of the Act, 21 U.S.C.section 360bbb-3(b)(1), unless the authorization is terminated  or revoked sooner.       Influenza A by PCR NEGATIVE NEGATIVE Final   Influenza B by PCR NEGATIVE NEGATIVE Final    Comment: (NOTE) The Xpert Xpress SARS-CoV-2/FLU/RSV plus assay is intended as an aid in the diagnosis of influenza from Nasopharyngeal swab specimens and should not be used as a sole basis  for treatment. Nasal washings and aspirates are unacceptable for Xpert Xpress SARS-CoV-2/FLU/RSV testing.  Fact Sheet for Patients: BloggerCourse.com  Fact Sheet for Healthcare Providers: SeriousBroker.it  This test is not yet approved or cleared by the [place] FDA and has been authorized for detection and/or diagnosis of SARS-CoV-2 by FDA under an Emergency Use Authorization (EUA). This EUA will remain in effect (meaning this test can be used) for the duration of the COVID-19 declaration under Section 564(b)(1) of the Act, 21 U.S.C. section 360bbb-3(b)(1), unless the authorization is terminated or revoked.  Performed at [facility], [address] [place], [place] [address]   MRSA PCR Screening     Status: None   Collection Time: 04/06/21 10:03 PM   Specimen: Nasal Mucosa; Nasopharyngeal  Result Value Ref Range Status   MRSA by PCR NEGATIVE NEGATIVE Final    Comment:        The GeneXpert MRSA Assay (FDA approved for NASAL specimens only), is one component of a comprehensive MRSA colonization surveillance program. It is not intended to diagnose MRSA infection nor to guide or monitor treatment for MRSA infections. Performed at [facility], [address],  [place], [place] [address]   SARS CORONAVIRUS 2 (TAT 6-24 HRS) Nasopharyngeal Nasopharyngeal Swab     Status: None   Collection Time: 04/10/21  1:36 PM   Specimen: Nasopharyngeal Swab  Result Value Ref Range Status   SARS Coronavirus 2 NEGATIVE NEGATIVE Final    Comment: (NOTE) SARS-CoV-2 target nucleic acids are NOT DETECTED.  The SARS-CoV-2 RNA is generally detectable in upper and lower respiratory specimens during the acute phase of infection. Negative results do not preclude SARS-CoV-2 infection, do not rule out co-infections with other pathogens, and should not be used as the sole basis for treatment or other patient management  decisions. Negative results must be combined with clinical observations, patient history, and epidemiological information. The expected result is Negative.  Fact Sheet for Patients: HairSlick.no  Fact Sheet for Healthcare Providers: quierodirigir.com  This test is not yet approved or cleared by the [place] FDA and  has been authorized for detection and/or diagnosis of SARS-CoV-2 by FDA under an Emergency Use Authorization (EUA). This EUA will remain  in effect (meaning this test can be used) for the duration of the COVID-19 declaration under Se ction 564(b)(1) of the Act, 21 U.S.C. section 360bbb-3(b)(1), unless the authorization is terminated or revoked sooner.  Performed at [facility], [address], [place], [place] [address]      Labs: Basic Metabolic Panel: Recent Labs  Lab 04/07/21 0516 04/08/21 0345 04/09/21 0755 04/10/21 0310 04/11/21 0202  NA 142 142 141 141 143  K 3.6 3.6 3.4* 3.1* 3.7  CL 103 103 101 104 107  CO2 GLUCOSE 129* 138* 107* 94 129*  BUN 18 24* 36* 34* 37*  CREATININE 1.25* 1.19* 1.31* 1.26* 1.33*  CALCIUM 9.7 9.2 8.8* 8.9 9.0  MG  --  2.2  --   --  2.5*   Liver Function Tests: No results for input(s): AST, ALT, ALKPHOS, BILITOT, PROT, ALBUMIN in the last 168 hours. Recent Labs  Lab 04/06/21 2137  LIPASE 21   No results for input(s): AMMONIA in the last 168 hours. CBC: Recent Labs  Lab 04/07/21 0516 04/08/21 0345 04/09/21 0755 04/10/21 0310 04/11/21 0202  WBC 8.7 10.1 11.1* 12.8* 10.9*  NEUTROABS 6.4  --   --   --   --   HGB 11.5* 11.5* 11.7* 11.7* 11.1*  HCT 35.7* 36.9 38.2 38.1 35.9*  MCV 74.7* 76.2* 77.6* 77.0* 77.5*  PLT 174 193 162 189 164   Cardiac Enzymes: No results for input(s): CKTOTAL, CKMB, CKMBINDEX, TROPONINI in the last 168 hours. BNP: BNP (last 3 results) Recent Labs    04/07/21 1044  BNP 786.3*    ProBNP (last 3  results) No results for input(s): PROBNP in the last 8760 hours.  CBG: Recent Labs  Lab 04/11/21 0744  GLUCAP 150*       Signed:  Rhetta Mura MD   [facility] 04/11/2021, 9:29 AM

## 2021-04-12 DIAGNOSIS — F0390 Unspecified dementia without behavioral disturbance: Secondary | ICD-10-CM | POA: Diagnosis not present

## 2021-04-12 DIAGNOSIS — N183 Chronic kidney disease, stage 3 unspecified: Secondary | ICD-10-CM | POA: Diagnosis not present

## 2021-04-12 DIAGNOSIS — E785 Hyperlipidemia, unspecified: Secondary | ICD-10-CM | POA: Diagnosis not present

## 2021-04-12 DIAGNOSIS — I130 Hypertensive heart and chronic kidney disease with heart failure and stage 1 through stage 4 chronic kidney disease, or unspecified chronic kidney disease: Secondary | ICD-10-CM | POA: Diagnosis not present

## 2021-04-12 DIAGNOSIS — I5030 Unspecified diastolic (congestive) heart failure: Secondary | ICD-10-CM | POA: Diagnosis not present

## 2021-04-12 DIAGNOSIS — F32A Depression, unspecified: Secondary | ICD-10-CM | POA: Diagnosis not present

## 2021-04-12 DIAGNOSIS — E039 Hypothyroidism, unspecified: Secondary | ICD-10-CM | POA: Diagnosis not present

## 2021-04-12 DIAGNOSIS — D649 Anemia, unspecified: Secondary | ICD-10-CM | POA: Diagnosis not present

## 2021-04-12 DIAGNOSIS — I480 Paroxysmal atrial fibrillation: Secondary | ICD-10-CM | POA: Diagnosis not present

## 2021-04-17 DIAGNOSIS — F411 Generalized anxiety disorder: Secondary | ICD-10-CM | POA: Diagnosis not present

## 2021-04-17 DIAGNOSIS — D649 Anemia, unspecified: Secondary | ICD-10-CM | POA: Diagnosis not present

## 2021-04-17 DIAGNOSIS — I480 Paroxysmal atrial fibrillation: Secondary | ICD-10-CM | POA: Diagnosis not present

## 2021-04-17 DIAGNOSIS — F32A Depression, unspecified: Secondary | ICD-10-CM | POA: Diagnosis not present

## 2021-04-17 DIAGNOSIS — F331 Major depressive disorder, recurrent, moderate: Secondary | ICD-10-CM | POA: Diagnosis not present

## 2021-04-17 DIAGNOSIS — F5102 Adjustment insomnia: Secondary | ICD-10-CM | POA: Diagnosis not present

## 2021-04-17 DIAGNOSIS — I5030 Unspecified diastolic (congestive) heart failure: Secondary | ICD-10-CM | POA: Diagnosis not present

## 2021-04-17 DIAGNOSIS — F0390 Unspecified dementia without behavioral disturbance: Secondary | ICD-10-CM | POA: Diagnosis not present

## 2021-04-17 DIAGNOSIS — I110 Hypertensive heart disease with heart failure: Secondary | ICD-10-CM | POA: Diagnosis not present

## 2021-04-19 DIAGNOSIS — R0981 Nasal congestion: Secondary | ICD-10-CM | POA: Diagnosis not present

## 2021-04-19 DIAGNOSIS — J3489 Other specified disorders of nose and nasal sinuses: Secondary | ICD-10-CM | POA: Diagnosis not present

## 2021-04-19 DIAGNOSIS — J029 Acute pharyngitis, unspecified: Secondary | ICD-10-CM | POA: Diagnosis not present

## 2021-04-30 DIAGNOSIS — Z1159 Encounter for screening for other viral diseases: Secondary | ICD-10-CM | POA: Diagnosis not present

## 2021-04-30 DIAGNOSIS — Z20828 Contact with and (suspected) exposure to other viral communicable diseases: Secondary | ICD-10-CM | POA: Diagnosis not present

## 2021-05-03 DIAGNOSIS — Z20828 Contact with and (suspected) exposure to other viral communicable diseases: Secondary | ICD-10-CM | POA: Diagnosis not present

## 2021-05-03 DIAGNOSIS — Z1159 Encounter for screening for other viral diseases: Secondary | ICD-10-CM | POA: Diagnosis not present

## 2021-05-07 DIAGNOSIS — Z1159 Encounter for screening for other viral diseases: Secondary | ICD-10-CM | POA: Diagnosis not present

## 2021-05-07 DIAGNOSIS — Z20828 Contact with and (suspected) exposure to other viral communicable diseases: Secondary | ICD-10-CM | POA: Diagnosis not present

## 2021-05-09 DIAGNOSIS — I5022 Chronic systolic (congestive) heart failure: Secondary | ICD-10-CM | POA: Diagnosis not present

## 2021-05-09 DIAGNOSIS — K5900 Constipation, unspecified: Secondary | ICD-10-CM | POA: Diagnosis not present

## 2021-05-09 DIAGNOSIS — R413 Other amnesia: Secondary | ICD-10-CM | POA: Diagnosis not present

## 2021-05-09 DIAGNOSIS — I480 Paroxysmal atrial fibrillation: Secondary | ICD-10-CM | POA: Diagnosis not present

## 2021-05-09 DIAGNOSIS — D649 Anemia, unspecified: Secondary | ICD-10-CM | POA: Diagnosis not present

## 2021-05-09 DIAGNOSIS — K922 Gastrointestinal hemorrhage, unspecified: Secondary | ICD-10-CM | POA: Diagnosis not present

## 2021-05-09 DIAGNOSIS — K869 Disease of pancreas, unspecified: Secondary | ICD-10-CM | POA: Diagnosis not present

## 2021-05-09 DIAGNOSIS — F411 Generalized anxiety disorder: Secondary | ICD-10-CM | POA: Diagnosis not present

## 2021-05-10 DIAGNOSIS — R41841 Cognitive communication deficit: Secondary | ICD-10-CM | POA: Diagnosis not present

## 2021-05-10 DIAGNOSIS — Z20828 Contact with and (suspected) exposure to other viral communicable diseases: Secondary | ICD-10-CM | POA: Diagnosis not present

## 2021-05-10 DIAGNOSIS — M25551 Pain in right hip: Secondary | ICD-10-CM | POA: Diagnosis not present

## 2021-05-10 DIAGNOSIS — Z9181 History of falling: Secondary | ICD-10-CM | POA: Diagnosis not present

## 2021-05-10 DIAGNOSIS — R262 Difficulty in walking, not elsewhere classified: Secondary | ICD-10-CM | POA: Diagnosis not present

## 2021-05-10 DIAGNOSIS — Z1159 Encounter for screening for other viral diseases: Secondary | ICD-10-CM | POA: Diagnosis not present

## 2021-05-10 DIAGNOSIS — M6281 Muscle weakness (generalized): Secondary | ICD-10-CM | POA: Diagnosis not present

## 2021-05-10 DIAGNOSIS — M25552 Pain in left hip: Secondary | ICD-10-CM | POA: Diagnosis not present

## 2021-05-14 DIAGNOSIS — Z1159 Encounter for screening for other viral diseases: Secondary | ICD-10-CM | POA: Diagnosis not present

## 2021-05-14 DIAGNOSIS — Z20828 Contact with and (suspected) exposure to other viral communicable diseases: Secondary | ICD-10-CM | POA: Diagnosis not present

## 2021-05-15 ENCOUNTER — Other Ambulatory Visit: Payer: Self-pay

## 2021-05-15 ENCOUNTER — Non-Acute Institutional Stay: Payer: Medicare HMO | Admitting: Hospice

## 2021-05-15 DIAGNOSIS — I509 Heart failure, unspecified: Secondary | ICD-10-CM | POA: Diagnosis not present

## 2021-05-15 DIAGNOSIS — Z9181 History of falling: Secondary | ICD-10-CM | POA: Diagnosis not present

## 2021-05-15 DIAGNOSIS — M25552 Pain in left hip: Secondary | ICD-10-CM | POA: Diagnosis not present

## 2021-05-15 DIAGNOSIS — F419 Anxiety disorder, unspecified: Secondary | ICD-10-CM | POA: Diagnosis not present

## 2021-05-15 DIAGNOSIS — Z515 Encounter for palliative care: Secondary | ICD-10-CM | POA: Diagnosis not present

## 2021-05-15 DIAGNOSIS — M6281 Muscle weakness (generalized): Secondary | ICD-10-CM | POA: Diagnosis not present

## 2021-05-15 DIAGNOSIS — M25551 Pain in right hip: Secondary | ICD-10-CM | POA: Diagnosis not present

## 2021-05-15 DIAGNOSIS — R262 Difficulty in walking, not elsewhere classified: Secondary | ICD-10-CM | POA: Diagnosis not present

## 2021-05-15 DIAGNOSIS — R269 Unspecified abnormalities of gait and mobility: Secondary | ICD-10-CM

## 2021-05-15 DIAGNOSIS — R41841 Cognitive communication deficit: Secondary | ICD-10-CM | POA: Diagnosis not present

## 2021-05-15 DIAGNOSIS — F32A Depression, unspecified: Secondary | ICD-10-CM | POA: Diagnosis not present

## 2021-05-15 NOTE — Progress Notes (Signed)
Therapist, nutritional Palliative Care Consult Note Telephone: [phone]  Fax: [phone]  PATIENT NAME: GERARDO TERRITO DOB: 05-Jun-1923 MRN: [id number]  PRIMARY CARE PROVIDER:   [name], MD [name], MD [address] [place],  [place] [address]  REFERRING PROVIDER: [name], MD [name], MD [address] [place],  [place] [address]  RESPONSIBLE PARTY:self/[place] Gaynelle Adu - Niece. POA [phone] [id number]     Contact Information     Name Relation Home Work Mobile   Pollard,Estelle Niece [phone]  [phone]   Clabe Seal Niece [phone]  [phone]   ATKINS,ELLEN Clearence Cheek   [phone]       Visit is to build trust and highlight Palliative Medicine as specialized medical care for people living with serious illness, aimed at facilitating better quality of life through symptoms relief, assisting with advance care planning and complex medical decision making. This is a follow up visit.  RECOMMENDATIONS/PLAN:   Advance Care Planning/Code Status: Patient is a Do Not Resuscitate  Goals of Care: Goals of care include to maximize quality of life and symptom management.  Visit consisted of counseling and education dealing with the complex and emotionally intense issues of symptom management and palliative care in the setting of serious and potentially life-threatening illness. Palliative care team will continue to support patient, patient's family, and medical team.  Symptom management/Plan:  Gait disturbance: Patient is a high fall risk. Continue ongoing PT/OT for gait training. Fall precautions discussed with patient and nursing.  Weakness: OT/PT for strengthening CHF: Continue Torsemide, Aldactone, Coreg, Losartan as ordered. Followed by Cardiologist Anxiety:and depression: Continue Xanax. Zoloft, Rameron. Meditation, slow deep breathing encouraged.  Follow up: Palliative care will continue to  follow for complex medical decision making, advance care planning, and clarification of goals. Return 6 weeks or prn. Encouraged to call provider sooner with any concerns.  CHIEF COMPLAINT: Palliative follow up  HISTORY OF PRESENT ILLNESS:  NATORIA ARCHIBALD a 87 y.o. female with multiple medical problems including weakness and gait disturbance resulting in recent fall. History of chronic CHF with exacerbation in May 2022 for which she was hospitalized 5/20-5/25/2022 and treated. Patient denies [ain/discomfort. History of Depression, HTN, Afib, GERD, left eye blindness, low vision in the right eye. History obtained from review of EMR, discussion with primary team, family and/or patient. Records reviewed and summarized above. All 10 point systems reviewed and are negative except as documented in history of present illness above  Review and summarization of Epic records shows history from other than patient.   Palliative Care was asked to follow this patient o help address complex decision making in the context of advance care planning and goals of care clarification.   PHYSICAL EXAM  BP 120/65 P77 R 18  General: In no acute distress, appropriately dressed Cardiovascular: regular rate and rhythm; no edema in BLE Pulmonary: no cough, no increased work of breathing, normal respiratory effort Abdomen: soft, non tender, no guarding, positive bowel sounds in all quadrants GU:  no suprapubic tenderness Eyes: Normal lids, no discharge ENMT: Moist mucous membranes Musculoskeletal:  weakness, gait instability Skin: no rash to visible skin, warm without cyanosis,  Psych: non-anxious affect Neurological: Weakness but otherwise non focal Heme/lymph/immuno: no bruises, no bleeding  PERTINENT MEDICATIONS:  Outpatient Encounter Medications as of 05/15/2021  Medication Sig   acetaminophen (TYLENOL) 500 MG tablet Take 500 mg by mouth every evening.   ALPRAZolam (XANAX) 1 MG tablet Take 1 tablet (1 mg  total) by mouth 3 (three) times daily as needed for anxiety.   benzonatate (TESSALON) 100 MG capsule Take 100 mg by mouth 3 (three) times daily as needed for cough.   carvedilol (COREG) 6.25 MG tablet Take 6.25 mg by mouth 2 (two) times daily.   colestipol (COLESTID) 1 g tablet Take 2 tablets (2 g total) by mouth daily. Hold FOR CONSTIPATION (Patient not taking: No sig reported)   ferrous sulfate 325 (65 FE) MG tablet Take 1 tablet (325 mg total) by mouth daily with breakfast.   guaiFENesin (MUCINEX) 600 MG 12 hr tablet Take 600 mg by mouth 2 (two) times daily.   guaifenesin (ROBITUSSIN) 100 MG/5ML syrup Take 100 mg by mouth every 8 (eight) hours as needed for cough.   lactobacillus acidophilus & bulgar (LACTINEX) chewable tablet Chew 1 tablet by mouth daily.   levothyroxine (SYNTHROID, LEVOTHROID) 25 MCG tablet Take 25 mcg by mouth daily before breakfast.   losartan (COZAAR) 50 MG tablet Take 0.5 tablets (25 mg total) by mouth daily.   mirabegron ER (MYRBETRIQ) 25 MG TB24 tablet Take 25 mg by mouth daily.   mirtazapine (REMERON) 30 MG tablet Take 1 tablet (30 mg total) by mouth at bedtime.   omeprazole (PRILOSEC) 20 MG capsule Take 20 mg by mouth daily.   pantoprazole (PROTONIX) 40 MG tablet Take 1 tablet (40 mg total) by mouth 2 (two) times daily.   sertraline (ZOLOFT) 100 MG tablet Take 1 tablet (100 mg total) by mouth daily.   simvastatin (ZOCOR) 40 MG tablet Take 40 mg by mouth daily.   spironolactone (ALDACTONE) 25 MG tablet Take 0.5 tablets (12.5 mg total) by mouth daily. (Patient not taking: Reported on 04/06/2021)   torsemide (DEMADEX) 10 MG tablet Take 1 tablet (10 mg total) by mouth daily.   triamcinolone cream (KENALOG) 0.1 % 1 application 2 (two) times daily as needed (itching). perineum   Zinc Oxide (DESITIN) 13 % CREA Apply 1 application topically See admin instructions. Cleanse groin area under breast with soap and water, pat dry and apply ointment on area as needed for redness.    No facility-administered encounter medications on file as of 05/15/2021.    HOSPICE ELIGIBILITY/DIAGNOSIS: TBD  PAST MEDICAL HISTORY:  Past Medical History:  Diagnosis Date   Atrial fibrillation (HCC)    Cholelithiasis    Constipation    Diabetes mellitus    Dizziness    with some gait disability   GERD (gastroesophageal reflux disease)    Hepatic cyst    Hyperlipidemia    Hypertension    Ischemic cardiomyopathy    LBBB (left bundle branch block)    Macular degeneration    Other postprocedural status(V45.89)    Peripheral edema      ALLERGIES:  Allergies  Allergen Reactions   Fosamax [Alendronate Sodium] Swelling   Penicillins Swelling and Other (See Comments)    Swelling, redness and warmth at injection site Has patient had a PCN reaction causing immediate rash, facial/tongue/throat swelling, SOB or lightheadedness with hypotension: No Has patient had a PCN reaction causing severe rash involving mucus membranes or skin necrosis: No Has patient had a PCN reaction that required hospitalization: No Has patient had a PCN reaction occurring within the last 10 years: No If all of the above answers are "NO", then may proceed with Cephalosporin use.   Sulfonamide Derivatives Swelling   Tetanus Toxoid Swelling      I spent  50 minutes providing this consultation; this includes time spent with patient/family,  chart review and documentation. More than 50% of the time in this consultation was spent on counseling and coordinating communication   Thank you for the opportunity to participate in the care of LAVELL RIDINGS Please call our office at [phone] if we can be of additional assistance.  Note: Portions of this note were generated with Scientist, clinical (histocompatibility and immunogenetics). Dictation errors may occur despite best attempts at proofreading.  Rosaura Carpenter, NP

## 2021-05-16 DIAGNOSIS — M25551 Pain in right hip: Secondary | ICD-10-CM | POA: Diagnosis not present

## 2021-05-16 DIAGNOSIS — M6281 Muscle weakness (generalized): Secondary | ICD-10-CM | POA: Diagnosis not present

## 2021-05-16 DIAGNOSIS — Z9181 History of falling: Secondary | ICD-10-CM | POA: Diagnosis not present

## 2021-05-16 DIAGNOSIS — M25552 Pain in left hip: Secondary | ICD-10-CM | POA: Diagnosis not present

## 2021-05-16 DIAGNOSIS — R262 Difficulty in walking, not elsewhere classified: Secondary | ICD-10-CM | POA: Diagnosis not present

## 2021-05-16 DIAGNOSIS — R41841 Cognitive communication deficit: Secondary | ICD-10-CM | POA: Diagnosis not present

## 2021-05-17 DIAGNOSIS — Z9181 History of falling: Secondary | ICD-10-CM | POA: Diagnosis not present

## 2021-05-17 DIAGNOSIS — M6281 Muscle weakness (generalized): Secondary | ICD-10-CM | POA: Diagnosis not present

## 2021-05-17 DIAGNOSIS — R262 Difficulty in walking, not elsewhere classified: Secondary | ICD-10-CM | POA: Diagnosis not present

## 2021-05-17 DIAGNOSIS — M25552 Pain in left hip: Secondary | ICD-10-CM | POA: Diagnosis not present

## 2021-05-17 DIAGNOSIS — R41841 Cognitive communication deficit: Secondary | ICD-10-CM | POA: Diagnosis not present

## 2021-05-17 DIAGNOSIS — M25551 Pain in right hip: Secondary | ICD-10-CM | POA: Diagnosis not present

## 2021-05-18 DIAGNOSIS — M25551 Pain in right hip: Secondary | ICD-10-CM | POA: Diagnosis not present

## 2021-05-18 DIAGNOSIS — R262 Difficulty in walking, not elsewhere classified: Secondary | ICD-10-CM | POA: Diagnosis not present

## 2021-05-18 DIAGNOSIS — M6281 Muscle weakness (generalized): Secondary | ICD-10-CM | POA: Diagnosis not present

## 2021-05-18 DIAGNOSIS — R41841 Cognitive communication deficit: Secondary | ICD-10-CM | POA: Diagnosis not present

## 2021-05-18 DIAGNOSIS — M25552 Pain in left hip: Secondary | ICD-10-CM | POA: Diagnosis not present

## 2021-05-18 DIAGNOSIS — Z9181 History of falling: Secondary | ICD-10-CM | POA: Diagnosis not present

## 2021-05-21 DIAGNOSIS — Z1159 Encounter for screening for other viral diseases: Secondary | ICD-10-CM | POA: Diagnosis not present

## 2021-05-21 DIAGNOSIS — Z20828 Contact with and (suspected) exposure to other viral communicable diseases: Secondary | ICD-10-CM | POA: Diagnosis not present

## 2021-05-22 DIAGNOSIS — Z9181 History of falling: Secondary | ICD-10-CM | POA: Diagnosis not present

## 2021-05-22 DIAGNOSIS — R262 Difficulty in walking, not elsewhere classified: Secondary | ICD-10-CM | POA: Diagnosis not present

## 2021-05-22 DIAGNOSIS — M25551 Pain in right hip: Secondary | ICD-10-CM | POA: Diagnosis not present

## 2021-05-22 DIAGNOSIS — R41841 Cognitive communication deficit: Secondary | ICD-10-CM | POA: Diagnosis not present

## 2021-05-22 DIAGNOSIS — M25552 Pain in left hip: Secondary | ICD-10-CM | POA: Diagnosis not present

## 2021-05-22 DIAGNOSIS — M6281 Muscle weakness (generalized): Secondary | ICD-10-CM | POA: Diagnosis not present

## 2021-05-23 DIAGNOSIS — R262 Difficulty in walking, not elsewhere classified: Secondary | ICD-10-CM | POA: Diagnosis not present

## 2021-05-23 DIAGNOSIS — M6281 Muscle weakness (generalized): Secondary | ICD-10-CM | POA: Diagnosis not present

## 2021-05-23 DIAGNOSIS — M25551 Pain in right hip: Secondary | ICD-10-CM | POA: Diagnosis not present

## 2021-05-23 DIAGNOSIS — M25552 Pain in left hip: Secondary | ICD-10-CM | POA: Diagnosis not present

## 2021-05-23 DIAGNOSIS — Z9181 History of falling: Secondary | ICD-10-CM | POA: Diagnosis not present

## 2021-05-23 DIAGNOSIS — R41841 Cognitive communication deficit: Secondary | ICD-10-CM | POA: Diagnosis not present

## 2021-05-24 DIAGNOSIS — M6281 Muscle weakness (generalized): Secondary | ICD-10-CM | POA: Diagnosis not present

## 2021-05-24 DIAGNOSIS — R41841 Cognitive communication deficit: Secondary | ICD-10-CM | POA: Diagnosis not present

## 2021-05-24 DIAGNOSIS — M25551 Pain in right hip: Secondary | ICD-10-CM | POA: Diagnosis not present

## 2021-05-24 DIAGNOSIS — Z20828 Contact with and (suspected) exposure to other viral communicable diseases: Secondary | ICD-10-CM | POA: Diagnosis not present

## 2021-05-24 DIAGNOSIS — M25552 Pain in left hip: Secondary | ICD-10-CM | POA: Diagnosis not present

## 2021-05-24 DIAGNOSIS — Z9181 History of falling: Secondary | ICD-10-CM | POA: Diagnosis not present

## 2021-05-24 DIAGNOSIS — Z1159 Encounter for screening for other viral diseases: Secondary | ICD-10-CM | POA: Diagnosis not present

## 2021-05-24 DIAGNOSIS — R262 Difficulty in walking, not elsewhere classified: Secondary | ICD-10-CM | POA: Diagnosis not present

## 2021-05-25 DIAGNOSIS — M25552 Pain in left hip: Secondary | ICD-10-CM | POA: Diagnosis not present

## 2021-05-25 DIAGNOSIS — M6281 Muscle weakness (generalized): Secondary | ICD-10-CM | POA: Diagnosis not present

## 2021-05-25 DIAGNOSIS — M25551 Pain in right hip: Secondary | ICD-10-CM | POA: Diagnosis not present

## 2021-05-25 DIAGNOSIS — Z9181 History of falling: Secondary | ICD-10-CM | POA: Diagnosis not present

## 2021-05-25 DIAGNOSIS — R262 Difficulty in walking, not elsewhere classified: Secondary | ICD-10-CM | POA: Diagnosis not present

## 2021-05-25 DIAGNOSIS — R41841 Cognitive communication deficit: Secondary | ICD-10-CM | POA: Diagnosis not present

## 2021-05-28 DIAGNOSIS — Z20828 Contact with and (suspected) exposure to other viral communicable diseases: Secondary | ICD-10-CM | POA: Diagnosis not present

## 2021-05-28 DIAGNOSIS — Z1159 Encounter for screening for other viral diseases: Secondary | ICD-10-CM | POA: Diagnosis not present

## 2021-05-28 DIAGNOSIS — M25552 Pain in left hip: Secondary | ICD-10-CM | POA: Diagnosis not present

## 2021-05-28 DIAGNOSIS — Z9181 History of falling: Secondary | ICD-10-CM | POA: Diagnosis not present

## 2021-05-28 DIAGNOSIS — R41841 Cognitive communication deficit: Secondary | ICD-10-CM | POA: Diagnosis not present

## 2021-05-28 DIAGNOSIS — R262 Difficulty in walking, not elsewhere classified: Secondary | ICD-10-CM | POA: Diagnosis not present

## 2021-05-28 DIAGNOSIS — M25551 Pain in right hip: Secondary | ICD-10-CM | POA: Diagnosis not present

## 2021-05-28 DIAGNOSIS — M6281 Muscle weakness (generalized): Secondary | ICD-10-CM | POA: Diagnosis not present

## 2021-05-30 DIAGNOSIS — Z9181 History of falling: Secondary | ICD-10-CM | POA: Diagnosis not present

## 2021-05-30 DIAGNOSIS — M25551 Pain in right hip: Secondary | ICD-10-CM | POA: Diagnosis not present

## 2021-05-30 DIAGNOSIS — M25552 Pain in left hip: Secondary | ICD-10-CM | POA: Diagnosis not present

## 2021-05-30 DIAGNOSIS — M6281 Muscle weakness (generalized): Secondary | ICD-10-CM | POA: Diagnosis not present

## 2021-05-30 DIAGNOSIS — R41841 Cognitive communication deficit: Secondary | ICD-10-CM | POA: Diagnosis not present

## 2021-05-30 DIAGNOSIS — R262 Difficulty in walking, not elsewhere classified: Secondary | ICD-10-CM | POA: Diagnosis not present

## 2021-05-31 DIAGNOSIS — Z1159 Encounter for screening for other viral diseases: Secondary | ICD-10-CM | POA: Diagnosis not present

## 2021-05-31 DIAGNOSIS — Z20828 Contact with and (suspected) exposure to other viral communicable diseases: Secondary | ICD-10-CM | POA: Diagnosis not present

## 2021-06-01 DIAGNOSIS — Z9181 History of falling: Secondary | ICD-10-CM | POA: Diagnosis not present

## 2021-06-01 DIAGNOSIS — M6281 Muscle weakness (generalized): Secondary | ICD-10-CM | POA: Diagnosis not present

## 2021-06-01 DIAGNOSIS — R41841 Cognitive communication deficit: Secondary | ICD-10-CM | POA: Diagnosis not present

## 2021-06-01 DIAGNOSIS — M25552 Pain in left hip: Secondary | ICD-10-CM | POA: Diagnosis not present

## 2021-06-01 DIAGNOSIS — M25551 Pain in right hip: Secondary | ICD-10-CM | POA: Diagnosis not present

## 2021-06-01 DIAGNOSIS — R262 Difficulty in walking, not elsewhere classified: Secondary | ICD-10-CM | POA: Diagnosis not present

## 2021-06-04 DIAGNOSIS — R262 Difficulty in walking, not elsewhere classified: Secondary | ICD-10-CM | POA: Diagnosis not present

## 2021-06-04 DIAGNOSIS — Z1159 Encounter for screening for other viral diseases: Secondary | ICD-10-CM | POA: Diagnosis not present

## 2021-06-04 DIAGNOSIS — R41841 Cognitive communication deficit: Secondary | ICD-10-CM | POA: Diagnosis not present

## 2021-06-04 DIAGNOSIS — M6281 Muscle weakness (generalized): Secondary | ICD-10-CM | POA: Diagnosis not present

## 2021-06-04 DIAGNOSIS — M25552 Pain in left hip: Secondary | ICD-10-CM | POA: Diagnosis not present

## 2021-06-04 DIAGNOSIS — Z20828 Contact with and (suspected) exposure to other viral communicable diseases: Secondary | ICD-10-CM | POA: Diagnosis not present

## 2021-06-04 DIAGNOSIS — Z9181 History of falling: Secondary | ICD-10-CM | POA: Diagnosis not present

## 2021-06-04 DIAGNOSIS — M25551 Pain in right hip: Secondary | ICD-10-CM | POA: Diagnosis not present

## 2021-06-05 DIAGNOSIS — M25551 Pain in right hip: Secondary | ICD-10-CM | POA: Diagnosis not present

## 2021-06-05 DIAGNOSIS — R41841 Cognitive communication deficit: Secondary | ICD-10-CM | POA: Diagnosis not present

## 2021-06-05 DIAGNOSIS — Z9181 History of falling: Secondary | ICD-10-CM | POA: Diagnosis not present

## 2021-06-05 DIAGNOSIS — R262 Difficulty in walking, not elsewhere classified: Secondary | ICD-10-CM | POA: Diagnosis not present

## 2021-06-05 DIAGNOSIS — M25552 Pain in left hip: Secondary | ICD-10-CM | POA: Diagnosis not present

## 2021-06-05 DIAGNOSIS — M6281 Muscle weakness (generalized): Secondary | ICD-10-CM | POA: Diagnosis not present

## 2021-06-06 DIAGNOSIS — R41841 Cognitive communication deficit: Secondary | ICD-10-CM | POA: Diagnosis not present

## 2021-06-06 DIAGNOSIS — M25551 Pain in right hip: Secondary | ICD-10-CM | POA: Diagnosis not present

## 2021-06-06 DIAGNOSIS — M25552 Pain in left hip: Secondary | ICD-10-CM | POA: Diagnosis not present

## 2021-06-06 DIAGNOSIS — M6281 Muscle weakness (generalized): Secondary | ICD-10-CM | POA: Diagnosis not present

## 2021-06-06 DIAGNOSIS — R262 Difficulty in walking, not elsewhere classified: Secondary | ICD-10-CM | POA: Diagnosis not present

## 2021-06-06 DIAGNOSIS — Z9181 History of falling: Secondary | ICD-10-CM | POA: Diagnosis not present

## 2021-06-07 DIAGNOSIS — M25552 Pain in left hip: Secondary | ICD-10-CM | POA: Diagnosis not present

## 2021-06-07 DIAGNOSIS — R41841 Cognitive communication deficit: Secondary | ICD-10-CM | POA: Diagnosis not present

## 2021-06-07 DIAGNOSIS — M25551 Pain in right hip: Secondary | ICD-10-CM | POA: Diagnosis not present

## 2021-06-07 DIAGNOSIS — M6281 Muscle weakness (generalized): Secondary | ICD-10-CM | POA: Diagnosis not present

## 2021-06-07 DIAGNOSIS — Z9181 History of falling: Secondary | ICD-10-CM | POA: Diagnosis not present

## 2021-06-07 DIAGNOSIS — Z1159 Encounter for screening for other viral diseases: Secondary | ICD-10-CM | POA: Diagnosis not present

## 2021-06-07 DIAGNOSIS — Z20828 Contact with and (suspected) exposure to other viral communicable diseases: Secondary | ICD-10-CM | POA: Diagnosis not present

## 2021-06-07 DIAGNOSIS — R262 Difficulty in walking, not elsewhere classified: Secondary | ICD-10-CM | POA: Diagnosis not present

## 2021-06-08 DIAGNOSIS — M25552 Pain in left hip: Secondary | ICD-10-CM | POA: Diagnosis not present

## 2021-06-08 DIAGNOSIS — M25551 Pain in right hip: Secondary | ICD-10-CM | POA: Diagnosis not present

## 2021-06-08 DIAGNOSIS — M6281 Muscle weakness (generalized): Secondary | ICD-10-CM | POA: Diagnosis not present

## 2021-06-08 DIAGNOSIS — R262 Difficulty in walking, not elsewhere classified: Secondary | ICD-10-CM | POA: Diagnosis not present

## 2021-06-08 DIAGNOSIS — R41841 Cognitive communication deficit: Secondary | ICD-10-CM | POA: Diagnosis not present

## 2021-06-08 DIAGNOSIS — Z9181 History of falling: Secondary | ICD-10-CM | POA: Diagnosis not present

## 2021-06-11 DIAGNOSIS — R41841 Cognitive communication deficit: Secondary | ICD-10-CM | POA: Diagnosis not present

## 2021-06-11 DIAGNOSIS — R262 Difficulty in walking, not elsewhere classified: Secondary | ICD-10-CM | POA: Diagnosis not present

## 2021-06-11 DIAGNOSIS — M25552 Pain in left hip: Secondary | ICD-10-CM | POA: Diagnosis not present

## 2021-06-11 DIAGNOSIS — Z20828 Contact with and (suspected) exposure to other viral communicable diseases: Secondary | ICD-10-CM | POA: Diagnosis not present

## 2021-06-11 DIAGNOSIS — M25551 Pain in right hip: Secondary | ICD-10-CM | POA: Diagnosis not present

## 2021-06-11 DIAGNOSIS — M6281 Muscle weakness (generalized): Secondary | ICD-10-CM | POA: Diagnosis not present

## 2021-06-11 DIAGNOSIS — Z9181 History of falling: Secondary | ICD-10-CM | POA: Diagnosis not present

## 2021-06-11 DIAGNOSIS — Z1159 Encounter for screening for other viral diseases: Secondary | ICD-10-CM | POA: Diagnosis not present

## 2021-06-12 DIAGNOSIS — Z9181 History of falling: Secondary | ICD-10-CM | POA: Diagnosis not present

## 2021-06-12 DIAGNOSIS — M6281 Muscle weakness (generalized): Secondary | ICD-10-CM | POA: Diagnosis not present

## 2021-06-12 DIAGNOSIS — M25552 Pain in left hip: Secondary | ICD-10-CM | POA: Diagnosis not present

## 2021-06-12 DIAGNOSIS — R262 Difficulty in walking, not elsewhere classified: Secondary | ICD-10-CM | POA: Diagnosis not present

## 2021-06-12 DIAGNOSIS — M25551 Pain in right hip: Secondary | ICD-10-CM | POA: Diagnosis not present

## 2021-06-12 DIAGNOSIS — R41841 Cognitive communication deficit: Secondary | ICD-10-CM | POA: Diagnosis not present

## 2021-06-13 DIAGNOSIS — R41841 Cognitive communication deficit: Secondary | ICD-10-CM | POA: Diagnosis not present

## 2021-06-13 DIAGNOSIS — Z9181 History of falling: Secondary | ICD-10-CM | POA: Diagnosis not present

## 2021-06-13 DIAGNOSIS — R262 Difficulty in walking, not elsewhere classified: Secondary | ICD-10-CM | POA: Diagnosis not present

## 2021-06-13 DIAGNOSIS — M6281 Muscle weakness (generalized): Secondary | ICD-10-CM | POA: Diagnosis not present

## 2021-06-13 DIAGNOSIS — M25551 Pain in right hip: Secondary | ICD-10-CM | POA: Diagnosis not present

## 2021-06-13 DIAGNOSIS — M25552 Pain in left hip: Secondary | ICD-10-CM | POA: Diagnosis not present

## 2021-06-14 DIAGNOSIS — Z1159 Encounter for screening for other viral diseases: Secondary | ICD-10-CM | POA: Diagnosis not present

## 2021-06-14 DIAGNOSIS — Z20828 Contact with and (suspected) exposure to other viral communicable diseases: Secondary | ICD-10-CM | POA: Diagnosis not present

## 2021-06-15 DIAGNOSIS — M25552 Pain in left hip: Secondary | ICD-10-CM | POA: Diagnosis not present

## 2021-06-15 DIAGNOSIS — R262 Difficulty in walking, not elsewhere classified: Secondary | ICD-10-CM | POA: Diagnosis not present

## 2021-06-15 DIAGNOSIS — M6281 Muscle weakness (generalized): Secondary | ICD-10-CM | POA: Diagnosis not present

## 2021-06-15 DIAGNOSIS — Z9181 History of falling: Secondary | ICD-10-CM | POA: Diagnosis not present

## 2021-06-15 DIAGNOSIS — R41841 Cognitive communication deficit: Secondary | ICD-10-CM | POA: Diagnosis not present

## 2021-06-15 DIAGNOSIS — M25551 Pain in right hip: Secondary | ICD-10-CM | POA: Diagnosis not present

## 2021-06-18 DIAGNOSIS — Z20828 Contact with and (suspected) exposure to other viral communicable diseases: Secondary | ICD-10-CM | POA: Diagnosis not present

## 2021-06-18 DIAGNOSIS — Z1159 Encounter for screening for other viral diseases: Secondary | ICD-10-CM | POA: Diagnosis not present

## 2021-06-19 DIAGNOSIS — M25552 Pain in left hip: Secondary | ICD-10-CM | POA: Diagnosis not present

## 2021-06-19 DIAGNOSIS — M25551 Pain in right hip: Secondary | ICD-10-CM | POA: Diagnosis not present

## 2021-06-19 DIAGNOSIS — R41841 Cognitive communication deficit: Secondary | ICD-10-CM | POA: Diagnosis not present

## 2021-06-19 DIAGNOSIS — R262 Difficulty in walking, not elsewhere classified: Secondary | ICD-10-CM | POA: Diagnosis not present

## 2021-06-19 DIAGNOSIS — Z9181 History of falling: Secondary | ICD-10-CM | POA: Diagnosis not present

## 2021-06-19 DIAGNOSIS — M6281 Muscle weakness (generalized): Secondary | ICD-10-CM | POA: Diagnosis not present

## 2021-06-20 DIAGNOSIS — R262 Difficulty in walking, not elsewhere classified: Secondary | ICD-10-CM | POA: Diagnosis not present

## 2021-06-20 DIAGNOSIS — M6281 Muscle weakness (generalized): Secondary | ICD-10-CM | POA: Diagnosis not present

## 2021-06-20 DIAGNOSIS — Z9181 History of falling: Secondary | ICD-10-CM | POA: Diagnosis not present

## 2021-06-20 DIAGNOSIS — M25551 Pain in right hip: Secondary | ICD-10-CM | POA: Diagnosis not present

## 2021-06-20 DIAGNOSIS — M25552 Pain in left hip: Secondary | ICD-10-CM | POA: Diagnosis not present

## 2021-06-20 DIAGNOSIS — R41841 Cognitive communication deficit: Secondary | ICD-10-CM | POA: Diagnosis not present

## 2021-06-21 DIAGNOSIS — M25551 Pain in right hip: Secondary | ICD-10-CM | POA: Diagnosis not present

## 2021-06-21 DIAGNOSIS — M25552 Pain in left hip: Secondary | ICD-10-CM | POA: Diagnosis not present

## 2021-06-21 DIAGNOSIS — Z20828 Contact with and (suspected) exposure to other viral communicable diseases: Secondary | ICD-10-CM | POA: Diagnosis not present

## 2021-06-21 DIAGNOSIS — R262 Difficulty in walking, not elsewhere classified: Secondary | ICD-10-CM | POA: Diagnosis not present

## 2021-06-21 DIAGNOSIS — Z1159 Encounter for screening for other viral diseases: Secondary | ICD-10-CM | POA: Diagnosis not present

## 2021-06-21 DIAGNOSIS — R41841 Cognitive communication deficit: Secondary | ICD-10-CM | POA: Diagnosis not present

## 2021-06-21 DIAGNOSIS — M6281 Muscle weakness (generalized): Secondary | ICD-10-CM | POA: Diagnosis not present

## 2021-06-21 DIAGNOSIS — Z9181 History of falling: Secondary | ICD-10-CM | POA: Diagnosis not present

## 2021-06-25 DIAGNOSIS — Z9181 History of falling: Secondary | ICD-10-CM | POA: Diagnosis not present

## 2021-06-25 DIAGNOSIS — R262 Difficulty in walking, not elsewhere classified: Secondary | ICD-10-CM | POA: Diagnosis not present

## 2021-06-25 DIAGNOSIS — M6281 Muscle weakness (generalized): Secondary | ICD-10-CM | POA: Diagnosis not present

## 2021-06-25 DIAGNOSIS — M25552 Pain in left hip: Secondary | ICD-10-CM | POA: Diagnosis not present

## 2021-06-25 DIAGNOSIS — M25551 Pain in right hip: Secondary | ICD-10-CM | POA: Diagnosis not present

## 2021-06-25 DIAGNOSIS — R41841 Cognitive communication deficit: Secondary | ICD-10-CM | POA: Diagnosis not present

## 2021-06-27 DIAGNOSIS — R41841 Cognitive communication deficit: Secondary | ICD-10-CM | POA: Diagnosis not present

## 2021-06-27 DIAGNOSIS — M25552 Pain in left hip: Secondary | ICD-10-CM | POA: Diagnosis not present

## 2021-06-27 DIAGNOSIS — Z9181 History of falling: Secondary | ICD-10-CM | POA: Diagnosis not present

## 2021-06-27 DIAGNOSIS — Z8616 Personal history of COVID-19: Secondary | ICD-10-CM | POA: Diagnosis not present

## 2021-06-27 DIAGNOSIS — M25551 Pain in right hip: Secondary | ICD-10-CM | POA: Diagnosis not present

## 2021-06-27 DIAGNOSIS — R262 Difficulty in walking, not elsewhere classified: Secondary | ICD-10-CM | POA: Diagnosis not present

## 2021-06-27 DIAGNOSIS — M6281 Muscle weakness (generalized): Secondary | ICD-10-CM | POA: Diagnosis not present

## 2021-06-28 DIAGNOSIS — Z9181 History of falling: Secondary | ICD-10-CM | POA: Diagnosis not present

## 2021-06-28 DIAGNOSIS — M25552 Pain in left hip: Secondary | ICD-10-CM | POA: Diagnosis not present

## 2021-06-28 DIAGNOSIS — M6281 Muscle weakness (generalized): Secondary | ICD-10-CM | POA: Diagnosis not present

## 2021-06-28 DIAGNOSIS — R41841 Cognitive communication deficit: Secondary | ICD-10-CM | POA: Diagnosis not present

## 2021-06-28 DIAGNOSIS — M25551 Pain in right hip: Secondary | ICD-10-CM | POA: Diagnosis not present

## 2021-06-28 DIAGNOSIS — R262 Difficulty in walking, not elsewhere classified: Secondary | ICD-10-CM | POA: Diagnosis not present

## 2021-06-29 DIAGNOSIS — M25552 Pain in left hip: Secondary | ICD-10-CM | POA: Diagnosis not present

## 2021-06-29 DIAGNOSIS — R41841 Cognitive communication deficit: Secondary | ICD-10-CM | POA: Diagnosis not present

## 2021-06-29 DIAGNOSIS — M25551 Pain in right hip: Secondary | ICD-10-CM | POA: Diagnosis not present

## 2021-06-29 DIAGNOSIS — R262 Difficulty in walking, not elsewhere classified: Secondary | ICD-10-CM | POA: Diagnosis not present

## 2021-06-29 DIAGNOSIS — Z9181 History of falling: Secondary | ICD-10-CM | POA: Diagnosis not present

## 2021-06-29 DIAGNOSIS — M6281 Muscle weakness (generalized): Secondary | ICD-10-CM | POA: Diagnosis not present

## 2021-07-03 DIAGNOSIS — R41841 Cognitive communication deficit: Secondary | ICD-10-CM | POA: Diagnosis not present

## 2021-07-03 DIAGNOSIS — Z9181 History of falling: Secondary | ICD-10-CM | POA: Diagnosis not present

## 2021-07-03 DIAGNOSIS — M25552 Pain in left hip: Secondary | ICD-10-CM | POA: Diagnosis not present

## 2021-07-03 DIAGNOSIS — R262 Difficulty in walking, not elsewhere classified: Secondary | ICD-10-CM | POA: Diagnosis not present

## 2021-07-03 DIAGNOSIS — M25551 Pain in right hip: Secondary | ICD-10-CM | POA: Diagnosis not present

## 2021-07-03 DIAGNOSIS — M6281 Muscle weakness (generalized): Secondary | ICD-10-CM | POA: Diagnosis not present

## 2021-07-04 DIAGNOSIS — Z20828 Contact with and (suspected) exposure to other viral communicable diseases: Secondary | ICD-10-CM | POA: Diagnosis not present

## 2021-07-05 DIAGNOSIS — M6281 Muscle weakness (generalized): Secondary | ICD-10-CM | POA: Diagnosis not present

## 2021-07-05 DIAGNOSIS — Z9181 History of falling: Secondary | ICD-10-CM | POA: Diagnosis not present

## 2021-07-05 DIAGNOSIS — R262 Difficulty in walking, not elsewhere classified: Secondary | ICD-10-CM | POA: Diagnosis not present

## 2021-07-05 DIAGNOSIS — R41841 Cognitive communication deficit: Secondary | ICD-10-CM | POA: Diagnosis not present

## 2021-07-05 DIAGNOSIS — M25552 Pain in left hip: Secondary | ICD-10-CM | POA: Diagnosis not present

## 2021-07-05 DIAGNOSIS — M25551 Pain in right hip: Secondary | ICD-10-CM | POA: Diagnosis not present

## 2021-07-06 DIAGNOSIS — R262 Difficulty in walking, not elsewhere classified: Secondary | ICD-10-CM | POA: Diagnosis not present

## 2021-07-06 DIAGNOSIS — M25551 Pain in right hip: Secondary | ICD-10-CM | POA: Diagnosis not present

## 2021-07-06 DIAGNOSIS — R41841 Cognitive communication deficit: Secondary | ICD-10-CM | POA: Diagnosis not present

## 2021-07-06 DIAGNOSIS — Z9181 History of falling: Secondary | ICD-10-CM | POA: Diagnosis not present

## 2021-07-06 DIAGNOSIS — M6281 Muscle weakness (generalized): Secondary | ICD-10-CM | POA: Diagnosis not present

## 2021-07-06 DIAGNOSIS — M25552 Pain in left hip: Secondary | ICD-10-CM | POA: Diagnosis not present

## 2021-07-09 DIAGNOSIS — M25551 Pain in right hip: Secondary | ICD-10-CM | POA: Diagnosis not present

## 2021-07-09 DIAGNOSIS — Z9181 History of falling: Secondary | ICD-10-CM | POA: Diagnosis not present

## 2021-07-09 DIAGNOSIS — R41841 Cognitive communication deficit: Secondary | ICD-10-CM | POA: Diagnosis not present

## 2021-07-09 DIAGNOSIS — M6281 Muscle weakness (generalized): Secondary | ICD-10-CM | POA: Diagnosis not present

## 2021-07-09 DIAGNOSIS — R262 Difficulty in walking, not elsewhere classified: Secondary | ICD-10-CM | POA: Diagnosis not present

## 2021-07-09 DIAGNOSIS — M25552 Pain in left hip: Secondary | ICD-10-CM | POA: Diagnosis not present

## 2021-07-10 DIAGNOSIS — M25551 Pain in right hip: Secondary | ICD-10-CM | POA: Diagnosis not present

## 2021-07-10 DIAGNOSIS — M25552 Pain in left hip: Secondary | ICD-10-CM | POA: Diagnosis not present

## 2021-07-10 DIAGNOSIS — R41841 Cognitive communication deficit: Secondary | ICD-10-CM | POA: Diagnosis not present

## 2021-07-10 DIAGNOSIS — R262 Difficulty in walking, not elsewhere classified: Secondary | ICD-10-CM | POA: Diagnosis not present

## 2021-07-10 DIAGNOSIS — Z9181 History of falling: Secondary | ICD-10-CM | POA: Diagnosis not present

## 2021-07-10 DIAGNOSIS — M6281 Muscle weakness (generalized): Secondary | ICD-10-CM | POA: Diagnosis not present

## 2021-07-11 DIAGNOSIS — Z20828 Contact with and (suspected) exposure to other viral communicable diseases: Secondary | ICD-10-CM | POA: Diagnosis not present

## 2021-07-12 DIAGNOSIS — R262 Difficulty in walking, not elsewhere classified: Secondary | ICD-10-CM | POA: Diagnosis not present

## 2021-07-12 DIAGNOSIS — M25551 Pain in right hip: Secondary | ICD-10-CM | POA: Diagnosis not present

## 2021-07-12 DIAGNOSIS — R41841 Cognitive communication deficit: Secondary | ICD-10-CM | POA: Diagnosis not present

## 2021-07-12 DIAGNOSIS — M25552 Pain in left hip: Secondary | ICD-10-CM | POA: Diagnosis not present

## 2021-07-12 DIAGNOSIS — Z9181 History of falling: Secondary | ICD-10-CM | POA: Diagnosis not present

## 2021-07-12 DIAGNOSIS — M6281 Muscle weakness (generalized): Secondary | ICD-10-CM | POA: Diagnosis not present

## 2021-07-18 DIAGNOSIS — M25552 Pain in left hip: Secondary | ICD-10-CM | POA: Diagnosis not present

## 2021-07-18 DIAGNOSIS — M25551 Pain in right hip: Secondary | ICD-10-CM | POA: Diagnosis not present

## 2021-07-18 DIAGNOSIS — R41841 Cognitive communication deficit: Secondary | ICD-10-CM | POA: Diagnosis not present

## 2021-07-18 DIAGNOSIS — M6281 Muscle weakness (generalized): Secondary | ICD-10-CM | POA: Diagnosis not present

## 2021-07-18 DIAGNOSIS — R262 Difficulty in walking, not elsewhere classified: Secondary | ICD-10-CM | POA: Diagnosis not present

## 2021-07-18 DIAGNOSIS — Z20828 Contact with and (suspected) exposure to other viral communicable diseases: Secondary | ICD-10-CM | POA: Diagnosis not present

## 2021-07-18 DIAGNOSIS — Z9181 History of falling: Secondary | ICD-10-CM | POA: Diagnosis not present

## 2021-07-20 DIAGNOSIS — M25551 Pain in right hip: Secondary | ICD-10-CM | POA: Diagnosis not present

## 2021-07-20 DIAGNOSIS — M6281 Muscle weakness (generalized): Secondary | ICD-10-CM | POA: Diagnosis not present

## 2021-07-20 DIAGNOSIS — R262 Difficulty in walking, not elsewhere classified: Secondary | ICD-10-CM | POA: Diagnosis not present

## 2021-07-20 DIAGNOSIS — M25552 Pain in left hip: Secondary | ICD-10-CM | POA: Diagnosis not present

## 2021-07-25 DIAGNOSIS — Z20828 Contact with and (suspected) exposure to other viral communicable diseases: Secondary | ICD-10-CM | POA: Diagnosis not present

## 2021-07-27 DIAGNOSIS — M25551 Pain in right hip: Secondary | ICD-10-CM | POA: Diagnosis not present

## 2021-07-27 DIAGNOSIS — R262 Difficulty in walking, not elsewhere classified: Secondary | ICD-10-CM | POA: Diagnosis not present

## 2021-07-27 DIAGNOSIS — M6281 Muscle weakness (generalized): Secondary | ICD-10-CM | POA: Diagnosis not present

## 2021-07-27 DIAGNOSIS — M25552 Pain in left hip: Secondary | ICD-10-CM | POA: Diagnosis not present

## 2021-07-30 DIAGNOSIS — M6281 Muscle weakness (generalized): Secondary | ICD-10-CM | POA: Diagnosis not present

## 2021-07-30 DIAGNOSIS — M25551 Pain in right hip: Secondary | ICD-10-CM | POA: Diagnosis not present

## 2021-07-30 DIAGNOSIS — R262 Difficulty in walking, not elsewhere classified: Secondary | ICD-10-CM | POA: Diagnosis not present

## 2021-07-30 DIAGNOSIS — M25552 Pain in left hip: Secondary | ICD-10-CM | POA: Diagnosis not present

## 2021-08-01 DIAGNOSIS — M6281 Muscle weakness (generalized): Secondary | ICD-10-CM | POA: Diagnosis not present

## 2021-08-01 DIAGNOSIS — R262 Difficulty in walking, not elsewhere classified: Secondary | ICD-10-CM | POA: Diagnosis not present

## 2021-08-01 DIAGNOSIS — Z20828 Contact with and (suspected) exposure to other viral communicable diseases: Secondary | ICD-10-CM | POA: Diagnosis not present

## 2021-08-01 DIAGNOSIS — M25551 Pain in right hip: Secondary | ICD-10-CM | POA: Diagnosis not present

## 2021-08-01 DIAGNOSIS — M25552 Pain in left hip: Secondary | ICD-10-CM | POA: Diagnosis not present

## 2021-08-07 DIAGNOSIS — M25552 Pain in left hip: Secondary | ICD-10-CM | POA: Diagnosis not present

## 2021-08-07 DIAGNOSIS — M6281 Muscle weakness (generalized): Secondary | ICD-10-CM | POA: Diagnosis not present

## 2021-08-07 DIAGNOSIS — R262 Difficulty in walking, not elsewhere classified: Secondary | ICD-10-CM | POA: Diagnosis not present

## 2021-08-07 DIAGNOSIS — M25551 Pain in right hip: Secondary | ICD-10-CM | POA: Diagnosis not present

## 2021-08-09 DIAGNOSIS — R262 Difficulty in walking, not elsewhere classified: Secondary | ICD-10-CM | POA: Diagnosis not present

## 2021-08-09 DIAGNOSIS — M6281 Muscle weakness (generalized): Secondary | ICD-10-CM | POA: Diagnosis not present

## 2021-08-09 DIAGNOSIS — M25552 Pain in left hip: Secondary | ICD-10-CM | POA: Diagnosis not present

## 2021-08-09 DIAGNOSIS — M25551 Pain in right hip: Secondary | ICD-10-CM | POA: Diagnosis not present

## 2021-08-10 DIAGNOSIS — F411 Generalized anxiety disorder: Secondary | ICD-10-CM | POA: Diagnosis not present

## 2021-08-15 DIAGNOSIS — Z20828 Contact with and (suspected) exposure to other viral communicable diseases: Secondary | ICD-10-CM | POA: Diagnosis not present

## 2021-08-22 DIAGNOSIS — Z8616 Personal history of COVID-19: Secondary | ICD-10-CM | POA: Diagnosis not present

## 2021-08-25 IMAGING — CR DG CHEST 2V
2 series · 2 of 2 positions shown · non-contrast
Comparison: 05/19/2020

CLINICAL DATA: Cough

EXAM:
CHEST - 2 VIEW

[w chest lat]
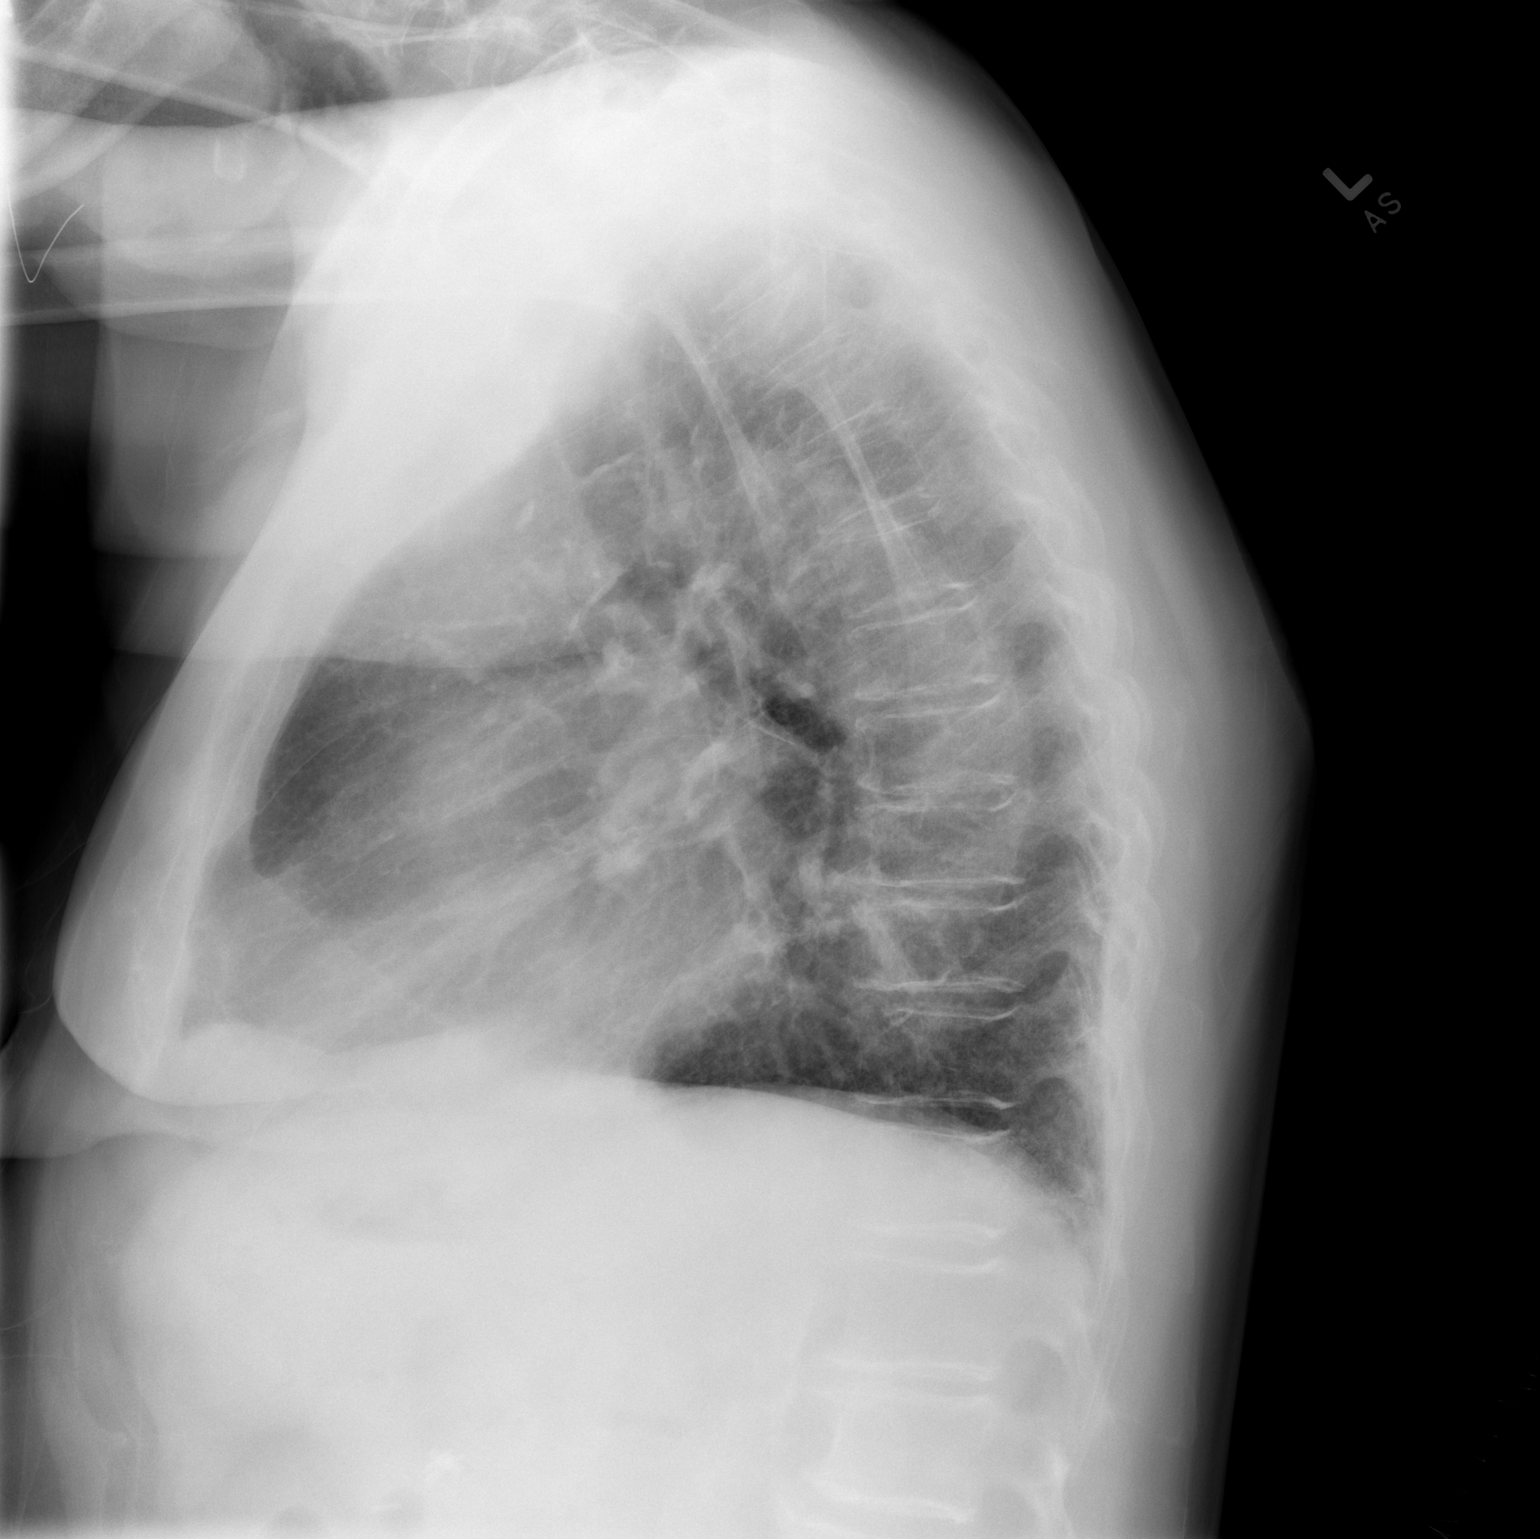

[w chest ap]
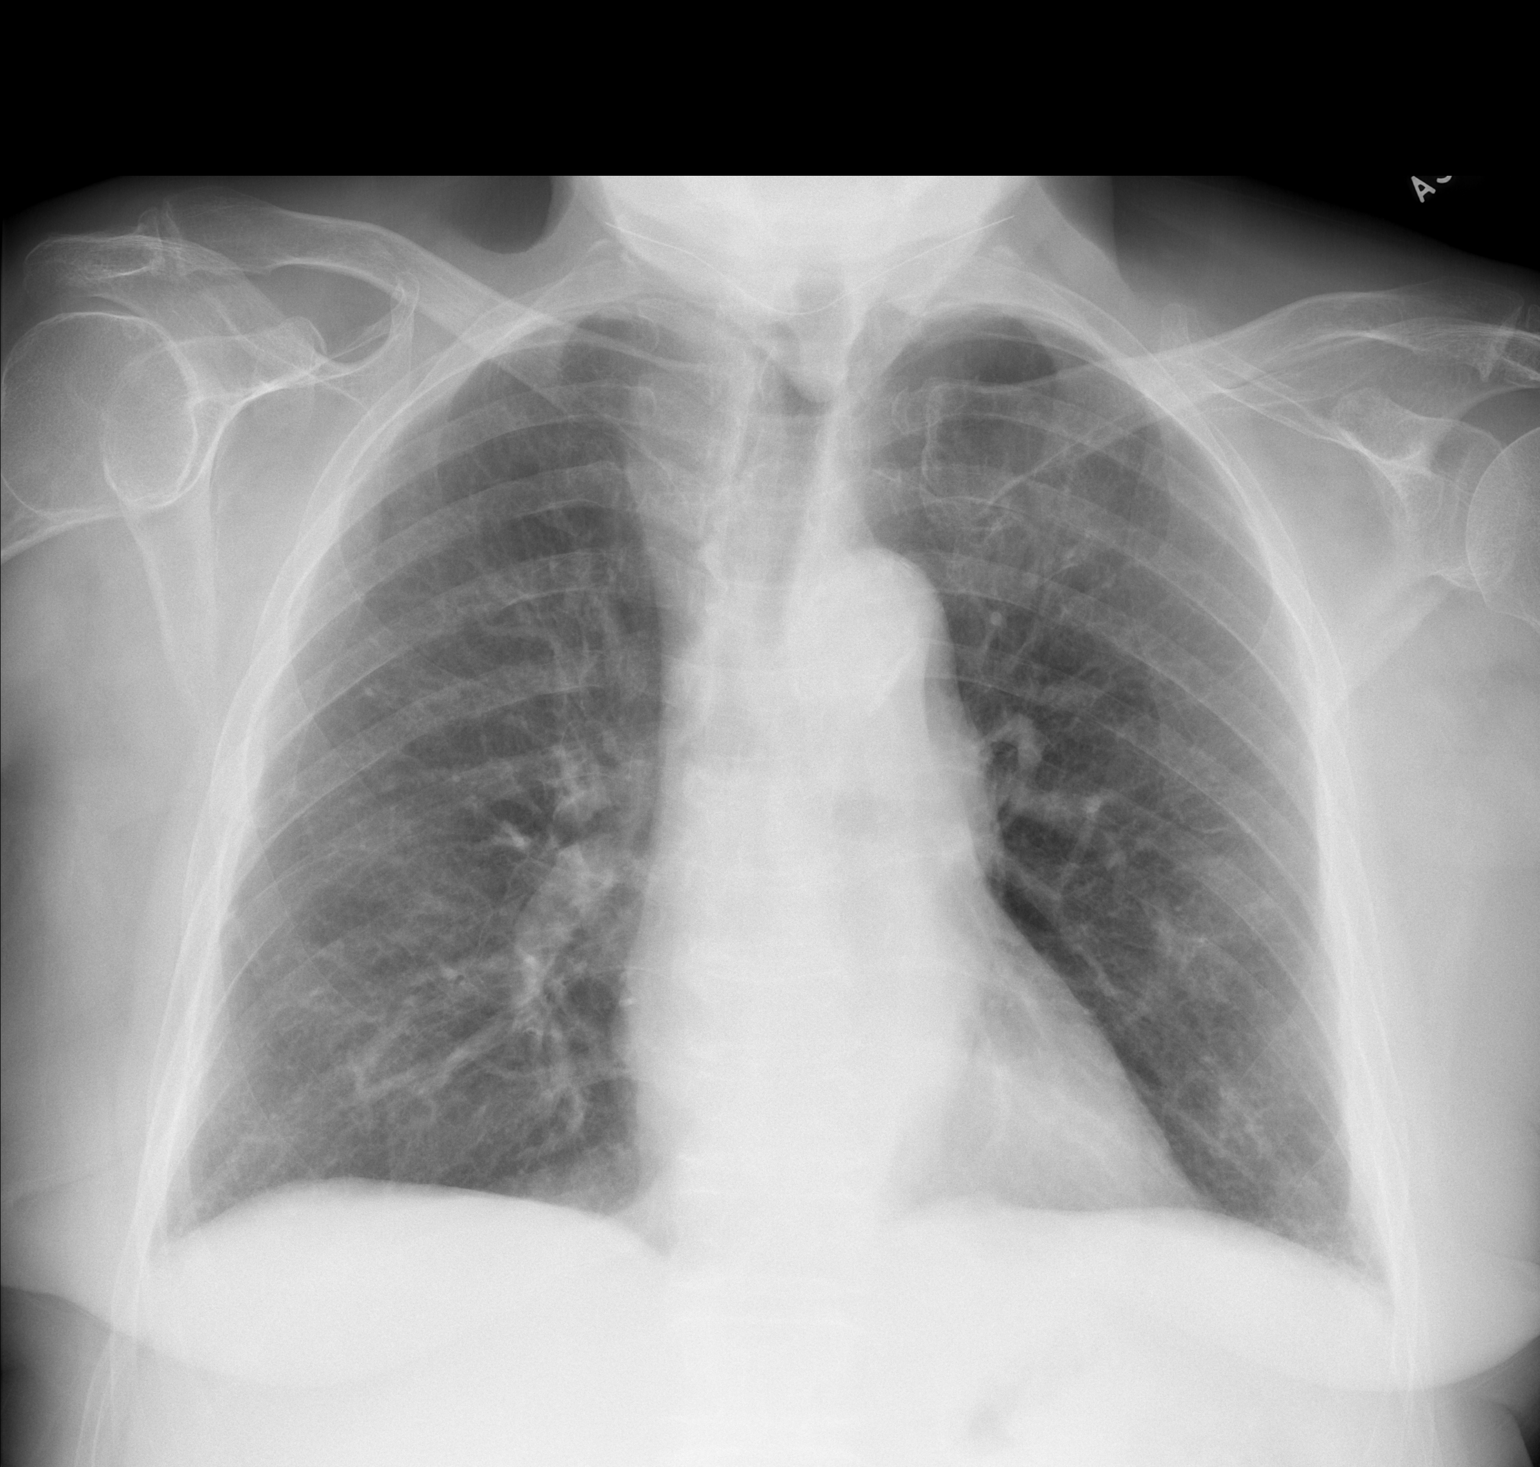

[2 of 2 positions shown; findings below may reference images not displayed]

FINDINGS: The lungs are mildly hyperinflated in keeping with changes of
underlying COPD. No superimposed focal pulmonary infiltrate. Opacity
at the left apex represents a confluence of osseous and pulmonary
shadows. No pneumothorax or pleural effusion. Cardiac size within
normal limits. Pulmonary vascularity is normal. No acute bone
abnormality.
IMPRESSION: No active cardiopulmonary disease.  COPD.

## 2021-08-29 DIAGNOSIS — Z20828 Contact with and (suspected) exposure to other viral communicable diseases: Secondary | ICD-10-CM | POA: Diagnosis not present

## 2021-09-02 ENCOUNTER — Other Ambulatory Visit: Payer: Self-pay

## 2021-09-02 ENCOUNTER — Encounter (HOSPITAL_COMMUNITY): Payer: Self-pay | Admitting: *Deleted

## 2021-09-02 ENCOUNTER — Emergency Department (HOSPITAL_COMMUNITY)
Admission: EM | Admit: 2021-09-02 | Discharge: 2021-09-02 | Disposition: A | Discharge status: Home / Self Care | Payer: Medicare HMO | Attending: Emergency Medicine | Admitting: Emergency Medicine

## 2021-09-02 DIAGNOSIS — I10 Essential (primary) hypertension: Secondary | ICD-10-CM | POA: Insufficient documentation

## 2021-09-02 DIAGNOSIS — I4891 Unspecified atrial fibrillation: Secondary | ICD-10-CM | POA: Insufficient documentation

## 2021-09-02 DIAGNOSIS — Z79899 Other long term (current) drug therapy: Secondary | ICD-10-CM | POA: Diagnosis not present

## 2021-09-02 DIAGNOSIS — R251 Tremor, unspecified: Secondary | ICD-10-CM | POA: Diagnosis not present

## 2021-09-02 DIAGNOSIS — E1165 Type 2 diabetes mellitus with hyperglycemia: Secondary | ICD-10-CM | POA: Diagnosis not present

## 2021-09-02 DIAGNOSIS — R739 Hyperglycemia, unspecified: Secondary | ICD-10-CM | POA: Diagnosis not present

## 2021-09-02 DIAGNOSIS — Z7401 Bed confinement status: Secondary | ICD-10-CM | POA: Diagnosis not present

## 2021-09-02 DIAGNOSIS — R5383 Other fatigue: Secondary | ICD-10-CM | POA: Diagnosis not present

## 2021-09-02 DIAGNOSIS — R5381 Other malaise: Secondary | ICD-10-CM | POA: Diagnosis not present

## 2021-09-02 LAB — BASIC METABOLIC PANEL
Anion gap: 9 (ref 5–15)
BUN: 15 mg/dL (ref 8–23)
CO2: 26 mmol/L (ref 22–32)
Calcium: 9.3 mg/dL (ref 8.9–10.3)
Chloride: 104 mmol/L (ref 98–111)
Creatinine, Ser: 1.16 mg/dL — ABNORMAL HIGH (ref 0.44–1.00)
GFR, Estimated: 43 mL/min — ABNORMAL LOW (ref >60)
Glucose, Bld: 170 mg/dL — ABNORMAL HIGH (ref 70–99)
Potassium: 3.8 mmol/L (ref 3.5–5.1)
Sodium: 139 mmol/L (ref 135–145)

## 2021-09-02 LAB — URINALYSIS, ROUTINE W REFLEX MICROSCOPIC
Bilirubin Urine: NEGATIVE
Glucose, UA: NEGATIVE mg/dL
Hgb urine dipstick: NEGATIVE
Ketones, ur: NEGATIVE mg/dL
Leukocytes,Ua: NEGATIVE
Nitrite: NEGATIVE
Protein, ur: NEGATIVE mg/dL
Specific Gravity, Urine: 1.01 (ref 1.005–1.030)
pH: 8 (ref 5.0–8.0)

## 2021-09-02 LAB — CBC
HCT: 38.3 % (ref 36.0–46.0)
Hemoglobin: 12.7 g/dL (ref 12.0–15.0)
MCH: 29.4 pg (ref 26.0–34.0)
MCHC: 33.2 g/dL (ref 30.0–36.0)
MCV: 88.7 fL (ref 80.0–100.0)
Platelets: 149 10*3/uL — ABNORMAL LOW (ref 150–400)
RBC: 4.32 MIL/uL (ref 3.87–5.11)
RDW: 13.7 % (ref 11.5–15.5)
WBC: 8.9 10*3/uL (ref 4.0–10.5)
nRBC: 0 % (ref 0.0–0.2)

## 2021-09-02 MED ORDER — ALPRAZOLAM 0.25 MG PO TABS
1.0000 mg | ORAL_TABLET | Freq: Once | ORAL | Status: AC
  Administered 2021-09-02: 1 mg via ORAL
  Filled 2021-09-02: qty 4

## 2021-09-02 NOTE — ED Provider Notes (Signed)
[facility] EMERGENCY DEPARTMENT Provider Note   CSN: [id number] Arrival date & time: 09/02/21  1914     History Chief Complaint  Patient presents with   Tremors    [name] is a 87 y.o. female.  HPI  87 year old female history of A. fib, diabetes, hyperlipidemia, hypertension, recent anemia presents today complaining of of diffuse jerking.  She reports she has had a similar episode in the past.  Today she awoke early and went to the bathroom.  She then noticed that she had some diffuse tremoring.  She did not lose consciousness.  She has had some ongoing chronic shoulder pain.  She normally takes Xanax 3 times a day.  She did not take her morning Xanax.  She felt that she was anxious earlier.  No focal deficits noted and blood sugar prehospital was 255.    Past Medical History:  Diagnosis Date   Atrial fibrillation (HCC)    Cholelithiasis    Constipation    Diabetes mellitus    Dizziness    with some gait disability   GERD (gastroesophageal reflux disease)    Hepatic cyst    Hyperlipidemia    Hypertension    Ischemic cardiomyopathy    LBBB (left bundle branch block)    Macular degeneration    Other postprocedural status(V45.89)    Peripheral edema     Patient Active Problem List   Diagnosis Date Noted   Microcytic anemia 04/07/2021   Iron deficiency anemia 04/07/2021   CKD (chronic kidney disease) stage 3, GFR 30-59 ml/min (HCC) 04/07/2021   Pancreas cyst 04/07/2021   Hypothyroid 04/07/2021   Aspiration into airway 04/07/2021   Palliative care encounter    Reactive airway disease 12/09/2017   Anxiety and depression 12/09/2017   Acute respiratory failure (HCC) 12/09/2017   Normocytic anemia 04/13/2016   Acute on chronic combined systolic and diastolic CHF (congestive heart failure) (HCC) 04/13/2016   Pyelonephritis 04/12/2016   Chronic combined systolic and diastolic congestive heart failure (HCC) 11/14/2013   Constipation  11/14/2013   Nausea 11/14/2013   Influenza with respiratory manifestations 11/09/2013   AKI (acute kidney injury) (HCC) 11/09/2013   Influenza 11/07/2013   Acute on chronic combined systolic and diastolic congestive heart failure (HCC) 11/07/2013   Acute respiratory failure with hypoxia (HCC) 11/07/2013   Fever 11/07/2013   Generalized weakness 11/07/2013   Peripheral neuropathy 05/05/2012   Closed left hip fracture (HCC) 05/04/2012   Chronic systolic HF (heart failure) (HCC) 05/04/2012   Arthritis 07/23/2011   DYSPNEA 07/31/2010   DEPRESSIVE DISORDER NOT ELSEWHERE CLASSIFIED 02/08/2009   Type 2 diabetes mellitus with diabetic neuropathy, without long-term current use of insulin (HCC) 11/08/2008   HLD (hyperlipidemia) 11/08/2008   Essential hypertension 11/08/2008   LBBB (left bundle branch block) 11/08/2008   Atrial fibrillation (HCC) 11/08/2008   GERD 11/08/2008   HEPATIC CYST 11/08/2008   CHOLELITHIASIS 11/08/2008   DILATION AND CURETTAGE, HX OF 11/08/2008    Past Surgical History:  Procedure Laterality Date   ANKLE SURGERY Bilateral    fractures   CHOLECYSTECTOMY     Dilation and curettage     HIP ARTHROPLASTY  05/05/2012   Procedure: ARTHROPLASTY BIPOLAR HIP;  Surgeon: Shelda Pal, MD;  Location: [facility];  Service: Orthopedics;  Laterality: Left;   TONSILLECTOMY     WRIST SURGERY Bilateral    wrist fractures     OB History   No obstetric history on file.     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  Authorizing Provider  acetaminophen (TYLENOL) 500 MG tablet Take 500 mg by mouth every evening.    [provider]  ALPRAZolam Prudy Feeler) 1 MG tablet Take 1 tablet (1 mg total) by mouth 3 (three) times daily as needed for anxiety. 04/11/21   Rhetta Mura, MD  benzonatate (TESSALON) 100 MG capsule Take 100 mg by mouth 3 (three) times daily as needed for cough.    [provider]  carvedilol (COREG) 6.25 MG tablet Take 6.25 mg by mouth 2 (two) times daily. 09/03/17   [provider]  colestipol (COLESTID) 1 g tablet Take 2 tablets (2 g total) by mouth daily. Hold FOR CONSTIPATION Patient not taking: No sig reported 12/11/17   Darlin Drop, DO  ferrous sulfate 325 (65 FE) MG tablet Take 1 tablet (325 mg total) by mouth daily with breakfast. 04/12/21   Rhetta Mura, MD  guaiFENesin (MUCINEX) 600 MG 12 hr tablet Take 600 mg by mouth 2 (two) times daily.    [provider]  guaifenesin (ROBITUSSIN) 100 MG/5ML syrup Take 100 mg by mouth every 8 (eight) hours as needed for cough.    [provider]  lactobacillus acidophilus & bulgar (LACTINEX) chewable tablet Chew 1 tablet by mouth daily.    [provider]  levothyroxine (SYNTHROID, LEVOTHROID) 25 MCG tablet Take 25 mcg by mouth daily before breakfast.    [provider]  losartan (COZAAR) 50 MG tablet Take 0.5 tablets (25 mg total) by mouth daily. 04/11/21   Rhetta Mura, MD  mirabegron ER (MYRBETRIQ) 25 MG TB24 tablet Take 25 mg by mouth daily.    [provider]  mirtazapine (REMERON) 30 MG tablet Take 1 tablet (30 mg total) by mouth at bedtime. 04/11/21   Rhetta Mura, MD  omeprazole (PRILOSEC) 20 MG capsule Take 20 mg by mouth daily.    [provider]  pantoprazole (PROTONIX) 40 MG tablet Take 1 tablet (40 mg total) by mouth 2 (two) times daily. 04/11/21   Rhetta Mura, MD  sertraline (ZOLOFT) 100 MG tablet Take 1 tablet (100 mg total)  by mouth daily. 04/11/21   Rhetta Mura, MD  simvastatin (ZOCOR) 40 MG tablet Take 40 mg by mouth daily.    [provider]  spironolactone (ALDACTONE) 25 MG tablet Take 0.5 tablets (12.5 mg total) by mouth daily. Patient not taking: Reported on 04/06/2021 02/21/17   Wendall Stade, MD  torsemide (DEMADEX) 10 MG tablet Take 1 tablet (10 mg total) by mouth daily. 04/12/21   Rhetta Mura, MD  triamcinolone cream (KENALOG) 0.1 % 1 application 2 (two) times daily as needed (itching). perineum    [provider]  Zinc Oxide (DESITIN) 13 % CREA Apply 1 application topically See admin instructions. Cleanse groin area under breast with soap and water, pat dry and apply ointment on area as needed for redness.  [provider]    Allergies    Fosamax [alendronate sodium], Penicillins, Sulfonamide derivatives, and Tetanus toxoid  Review of Systems   Review of Systems  Skin:  Positive for rash.       Rash on upper back that she reports is itchy  All other systems reviewed and are negative.  Physical Exam Updated Vital Signs BP (!) 168/83 (BP Location: Right Arm)   Pulse 71   Temp 98.1 F (36.7 C) (Oral)   Resp 15   Wt 92.5 kg   SpO2 97%   BMI 27.67 kg/m   Physical Exam Vitals reviewed.  Constitutional:      General: She is not in acute distress.    Appearance: Normal appearance.  HENT:     Head: Normocephalic.     Right Ear: External ear normal.     Left Ear: External ear normal.     Nose: Nose normal.     Mouth/Throat:     Pharynx: Oropharynx is clear.  Eyes:     Extraocular Movements: Extraocular movements intact.     Pupils: Pupils are equal, round, and reactive to light.  Cardiovascular:     Rate and Rhythm: Normal rate and regular rhythm.     Pulses: Normal pulses.  Pulmonary:     Effort: Pulmonary effort is normal.  Abdominal:     General: Abdomen is flat.     Palpations: Abdomen is soft.  Musculoskeletal:        General: No  swelling or tenderness. Normal range of motion.     Cervical back: Normal range of motion.  Skin:    General: Skin is warm.     Capillary Refill: Capillary refill takes less than 2 seconds.  Neurological:     General: No focal deficit present.     Mental Status: She is alert.  Psychiatric:        Mood and Affect: Mood normal.    ED Results / Procedures / Treatments   Labs (all labs ordered are listed, but only abnormal results are displayed) Labs Reviewed - No data to display  EKG None  Radiology No results found.  Procedures Procedures   Medications Ordered in ED Medications - No data to display  ED Course  I have reviewed the triage vital signs and the nursing notes.  Pertinent labs & imaging results that were available during my care of the patient were reviewed by me and considered in my medical decision making (see chart for details).  Clinical Course as of 09/02/21 1430  Sun Sep 02, 2021  1328 Creatinine(!): 1.16 [DR]    Clinical Course User Index [DR] Margarita Grizzle, MD   MDM Rules/Calculators/A&P                           87 yo female on xanax tid presented with some tremors today. Physical exam without acute abnormality Patient given xanax here She feels improved. Labs and urinalysis with some hyperglycemia, o/w normal for patient.  No evidence of dka Appears stable for d/c Final Clinical Impression(s) / ED Diagnoses Final diagnoses:  Tremor    Rx / DC Orders ED Discharge Orders     None        Margarita Grizzle, MD 09/02/21 1433

## 2021-09-02 NOTE — ED Notes (Signed)
EDP at [facility] 

## 2021-09-02 NOTE — ED Notes (Signed)
Moving to h/w 14 from 35 to await PTAR for transport back to [facility]. DNR with d/c paperwork.

## 2021-09-02 NOTE — ED Triage Notes (Signed)
BIB [facility] from [facility], reports woke fine, developed general tremor, then anxiety and hyperventilation, c/o L shoulder r/t fall in June, has not had morning meds including her xanax. Calmed down enroute. CBG 255 after breafast. Has not had her xanax this am. Stroke screen negative.

## 2021-09-02 NOTE — ED Notes (Signed)
DNR, belongings, clothes with all paperwork bagged and labeled behind pt on stretcher. Awaiting PTAR arrival.

## 2021-09-02 NOTE — Discharge Instructions (Addendum)
Here in the emergency department reveals normal blood count, chemistry except for mild high blood sugar and clear urine.  Patient given her a.m. dose of Xanax and symptoms appear to have resolved.  She appears stable for discharge back to her facility

## 2021-09-02 NOTE — ED Notes (Signed)
Assisted pt to restroom with walker. Pt attempted to provide UA , UA contaminated  with bowl movement.

## 2021-09-02 NOTE — ED Notes (Signed)
Report called to [facility]

## 2021-09-02 NOTE — ED Notes (Signed)
No changes. Remains alert, NAD, calm, interactive. Report given to PTAR. D/c'd home to [facility]

## 2021-09-05 DIAGNOSIS — Z20828 Contact with and (suspected) exposure to other viral communicable diseases: Secondary | ICD-10-CM | POA: Diagnosis not present

## 2021-09-12 DIAGNOSIS — Z20828 Contact with and (suspected) exposure to other viral communicable diseases: Secondary | ICD-10-CM | POA: Diagnosis not present

## 2021-09-19 DIAGNOSIS — Z20822 Contact with and (suspected) exposure to covid-19: Secondary | ICD-10-CM | POA: Diagnosis not present

## 2021-09-21 ENCOUNTER — Non-Acute Institutional Stay: Payer: Medicare HMO | Admitting: Hospice

## 2021-09-21 ENCOUNTER — Other Ambulatory Visit: Payer: Self-pay

## 2021-09-21 DIAGNOSIS — F419 Anxiety disorder, unspecified: Secondary | ICD-10-CM | POA: Diagnosis not present

## 2021-09-21 DIAGNOSIS — Z515 Encounter for palliative care: Secondary | ICD-10-CM | POA: Diagnosis not present

## 2021-09-21 DIAGNOSIS — I509 Heart failure, unspecified: Secondary | ICD-10-CM | POA: Diagnosis not present

## 2021-09-21 DIAGNOSIS — F32A Depression, unspecified: Secondary | ICD-10-CM

## 2021-09-21 NOTE — Progress Notes (Signed)
[facility] Consult Note Telephone: [phone]  Fax: [phone]  PATIENT NAME: [name] DOB: [birth year] MRN: [id number]  PRIMARY CARE PROVIDER:   Mayra Neer, MD Mayra Neer, MD [address] [place],  [place] [address]  REFERRING PROVIDER: Mayra Neer, MD Mayra Neer, MD [address] [place],  [place] [address]  RESPONSIBLE PARTY:  self/[place] Nelida Meuse - Niece. [address]     Name Relation Home Work [place] Niece [phone]  [phone]   Scharlene Corn Niece [id number]  [phone]   Baldwin Denman George   [phone]       Visit is to build trust and highlight Palliative Medicine as specialized medical care for people living with serious illness, aimed at facilitating better quality of life through symptoms relief, assisting with advance care planning and complex medical decision making. This is a follow up visit.  RECOMMENDATIONS/PLAN:   Advance Care Planning/Code Status: Patient is a Do Not Resuscitate  Goals of Care: Goals of care include to maximize quality of life and symptom management.  Visit consisted of counseling and education dealing with the complex and emotionally intense issues of symptom management and palliative care in the setting of serious and potentially life-threatening illness. Palliative care team will continue to support patient, patient's family, and medical team.  Symptom management/Plan:  CHF: Continue Torsemide, Aldactone, Coreg, Losartan as ordered. Followed by Cardiologist Anxiety/depression: Continue Xanax. Zoloft, Rameron. Meditation, slow deep breathing encouraged. Follow up: Palliative care will continue to follow for complex medical decision making, advance care planning, and clarification of goals. Return 6 weeks or prn. Encouraged to call provider sooner with any concerns.  CHIEF  COMPLAINT: Palliative follow up  HISTORY OF PRESENT ILLNESS:  [name] a 86 y.o. female with multiple medical problems including chronic CHF with no exacerbation at this time She endorses weakness and occasional shortness of breath on exertion. She  denies pain/discomfort. No respiratory distress during visit. History of Depression, Anxiety, HTN, Afib, GERD, left eye blindness, low vision in the right eye. History obtained from review of EMR, discussion with primary team, family and/or patient. Records reviewed and summarized above. All 10 point systems reviewed and are negative except as documented in history of present illness above  Review and summarization of Epic records shows history from other than patient.   Palliative Care was asked to follow this patient o help address complex decision making in the context of advance care planning and goals of care clarification.  I reviewed patient records, labs, notes, testing and imaging myself as needed and where available.  PHYSICAL EXAM  General: In no acute distress, appropriately dressed Cardiovascular: regular rate and rhythm; no edema in BLE Pulmonary: no cough, no increased work of breathing, normal respiratory effort, 95% oxygen saturation on room air.  Abdomen: soft, non tender, no guarding, positive bowel sounds in all quadrants GU:  no suprapubic tenderness Eyes: Normal lids, no discharge ENMT: Moist mucous membranes Musculoskeletal:  weakness, gait instability Skin: no rash to visible skin, warm without cyanosis,  Psych: anxious Neurological: Weakness but otherwise non focal Heme/lymph/immuno: no bruises, no bleeding  PERTINENT MEDICATIONS:  Outpatient Encounter Medications as of 09/21/2021  Medication Sig   acetaminophen (TYLENOL) 500 MG tablet Take 500 mg by mouth every evening.   ALPRAZolam (XANAX) 1 MG tablet Take 1 tablet (1 mg total) by mouth 3 (three) times daily as needed for anxiety.   benzonatate (TESSALON)  100 MG capsule Take 100 mg by mouth 3 (three) times daily as needed for cough.   carvedilol (COREG) 6.25 MG tablet Take 6.25 mg by mouth 2 (two) times daily.   colestipol (COLESTID) 1 g tablet Take 2 tablets (2 g total) by mouth daily. Hold FOR CONSTIPATION (Patient not taking: No sig reported)   ferrous sulfate 325 (65 FE) MG tablet Take 1 tablet (325 mg total) by mouth daily with breakfast.   guaiFENesin (MUCINEX) 600 MG 12 hr tablet Take 600 mg by mouth 2 (two) times daily.   guaifenesin (ROBITUSSIN) 100 MG/5ML syrup Take 100 mg by mouth every 8 (eight) hours as needed for cough.   lactobacillus acidophilus & bulgar (LACTINEX) chewable tablet Chew 1 tablet by mouth daily.   levothyroxine (SYNTHROID, LEVOTHROID) 25 MCG tablet Take 25 mcg by mouth daily before breakfast.   losartan (COZAAR) 50 MG tablet Take 0.5 tablets (25 mg total) by mouth daily.   mirabegron ER (MYRBETRIQ) 25 MG TB24 tablet Take 25 mg by mouth daily.   mirtazapine (REMERON) 30 MG tablet Take 1 tablet (30 mg total) by mouth at bedtime.   omeprazole (PRILOSEC) 20 MG capsule Take 20 mg by mouth daily.   pantoprazole (PROTONIX) 40 MG tablet Take 1 tablet (40 mg total) by mouth 2 (two) times daily.   sertraline (ZOLOFT) 100 MG tablet Take 1 tablet (100 mg total) by mouth daily.   simvastatin (ZOCOR) 40 MG tablet Take 40 mg by mouth daily.   spironolactone (ALDACTONE) 25 MG tablet Take 0.5 tablets (12.5 mg total) by mouth daily. (Patient not taking: Reported on 04/06/2021)   torsemide (DEMADEX) 10 MG tablet Take 1 tablet (10 mg total) by mouth daily.   triamcinolone cream (KENALOG) 0.1 % 1 application 2 (two) times daily as needed (itching). perineum   Zinc Oxide (DESITIN) 13 % CREA Apply 1 application topically See admin instructions. Cleanse groin area under breast with soap and water, pat dry and apply ointment on area as needed for redness.   No facility-administered encounter medications on file as of 09/21/2021.    HOSPICE  ELIGIBILITY/DIAGNOSIS: TBD  PAST MEDICAL HISTORY:  Past Medical History:  Diagnosis Date   Atrial fibrillation (HCC)    Cholelithiasis    Constipation    Diabetes mellitus    Dizziness    with some gait disability   GERD (gastroesophageal reflux disease)    Hepatic cyst    Hyperlipidemia    Hypertension    Ischemic cardiomyopathy    LBBB (left bundle branch block)    Macular degeneration    Other postprocedural status(V45.89)    Peripheral edema       ALLERGIES:  Allergies  Allergen Reactions   Fosamax [Alendronate Sodium] Swelling   Penicillins Swelling and Other (See Comments)    Swelling, redness and warmth at injection site Has patient had a PCN reaction causing immediate rash, facial/tongue/throat swelling, SOB or lightheadedness with hypotension: No Has patient had a PCN reaction causing severe rash involving mucus membranes or skin necrosis: No Has patient had a PCN reaction that required hospitalization: No Has patient had a PCN reaction occurring within the last 10 years: No If all of the above answers are "NO", then may proceed with Cephalosporin use.   Sulfonamide Derivatives Swelling   Tetanus Toxoid Swelling      I spent 40 minutes providing this consultation; this includes time spent with patient/family, chart review and documentation. More than 50% of the time in this consultation was spent  on counseling and coordinating communication   Thank you for the opportunity to participate in the care of IXEL BOEHNING Please call our office at [phone] if we can be of additional assistance.  Note: Portions of this note were generated with Scientist, clinical (histocompatibility and immunogenetics). Dictation errors may occur despite best attempts at proofreading.  Rosaura Carpenter, NP

## 2021-09-26 DIAGNOSIS — Z20822 Contact with and (suspected) exposure to covid-19: Secondary | ICD-10-CM | POA: Diagnosis not present

## 2021-09-27 DIAGNOSIS — F411 Generalized anxiety disorder: Secondary | ICD-10-CM | POA: Diagnosis not present

## 2021-09-27 DIAGNOSIS — I130 Hypertensive heart and chronic kidney disease with heart failure and stage 1 through stage 4 chronic kidney disease, or unspecified chronic kidney disease: Secondary | ICD-10-CM | POA: Diagnosis not present

## 2021-09-27 DIAGNOSIS — H9190 Unspecified hearing loss, unspecified ear: Secondary | ICD-10-CM | POA: Diagnosis not present

## 2021-09-27 DIAGNOSIS — E039 Hypothyroidism, unspecified: Secondary | ICD-10-CM | POA: Diagnosis not present

## 2021-09-27 DIAGNOSIS — I5022 Chronic systolic (congestive) heart failure: Secondary | ICD-10-CM | POA: Diagnosis not present

## 2021-09-27 DIAGNOSIS — E1122 Type 2 diabetes mellitus with diabetic chronic kidney disease: Secondary | ICD-10-CM | POA: Diagnosis not present

## 2021-09-27 DIAGNOSIS — F331 Major depressive disorder, recurrent, moderate: Secondary | ICD-10-CM | POA: Diagnosis not present

## 2021-09-27 DIAGNOSIS — D692 Other nonthrombocytopenic purpura: Secondary | ICD-10-CM | POA: Diagnosis not present

## 2021-09-27 DIAGNOSIS — H547 Unspecified visual loss: Secondary | ICD-10-CM | POA: Diagnosis not present

## 2021-09-27 DIAGNOSIS — I480 Paroxysmal atrial fibrillation: Secondary | ICD-10-CM | POA: Diagnosis not present

## 2021-10-02 DIAGNOSIS — H540X34 Blindness right eye category 3, blindness left eye category 4: Secondary | ICD-10-CM | POA: Diagnosis not present

## 2021-10-02 DIAGNOSIS — H540X35 Blindness right eye category 3, blindness left eye category 5: Secondary | ICD-10-CM | POA: Diagnosis not present

## 2021-10-03 DIAGNOSIS — Z20828 Contact with and (suspected) exposure to other viral communicable diseases: Secondary | ICD-10-CM | POA: Diagnosis not present

## 2021-10-04 DIAGNOSIS — H540X34 Blindness right eye category 3, blindness left eye category 4: Secondary | ICD-10-CM | POA: Diagnosis not present

## 2021-10-04 DIAGNOSIS — H540X35 Blindness right eye category 3, blindness left eye category 5: Secondary | ICD-10-CM | POA: Diagnosis not present

## 2021-10-08 DIAGNOSIS — H540X34 Blindness right eye category 3, blindness left eye category 4: Secondary | ICD-10-CM | POA: Diagnosis not present

## 2021-10-08 DIAGNOSIS — H540X35 Blindness right eye category 3, blindness left eye category 5: Secondary | ICD-10-CM | POA: Diagnosis not present

## 2021-10-10 ENCOUNTER — Telehealth: Payer: Self-pay | Admitting: Cardiovascular Disease

## 2021-10-10 DIAGNOSIS — H540X35 Blindness right eye category 3, blindness left eye category 5: Secondary | ICD-10-CM | POA: Diagnosis not present

## 2021-10-10 DIAGNOSIS — Z20828 Contact with and (suspected) exposure to other viral communicable diseases: Secondary | ICD-10-CM | POA: Diagnosis not present

## 2021-10-10 DIAGNOSIS — H540X34 Blindness right eye category 3, blindness left eye category 4: Secondary | ICD-10-CM | POA: Diagnosis not present

## 2021-10-10 NOTE — Telephone Encounter (Signed)
Patient's niece called stating her aunt has being running high BP for the past 4 days, but doesn't have any readings.  She is wondering if there is anything that Dr. Eden Emms can prescribed to her.

## 2021-10-10 NOTE — Telephone Encounter (Signed)
Spoke with the patient's niece who reports that the patient's blood pressure has been up over the last several days. The only readings that she could provide me were 199/98 and 163/?Marland Kitchen She states that her blood pressure is usually good through out the day but increases in the evenings. Advised the patient's niece to have her keep a log of her blood pressures as this will help with medication management.  The patient has not seen Dr. Eden Emms since 11/2018. Patient recently saw her PCP Dr. Clelia Croft and I have advised the niece that it would be best to reach out to him in regards to elevated BP's. She verbalized understanding and will have the patient log Bps.

## 2021-10-15 DIAGNOSIS — H540X35 Blindness right eye category 3, blindness left eye category 5: Secondary | ICD-10-CM | POA: Diagnosis not present

## 2021-10-15 DIAGNOSIS — H540X34 Blindness right eye category 3, blindness left eye category 4: Secondary | ICD-10-CM | POA: Diagnosis not present

## 2021-10-15 DIAGNOSIS — Z20828 Contact with and (suspected) exposure to other viral communicable diseases: Secondary | ICD-10-CM | POA: Diagnosis not present

## 2021-10-15 DIAGNOSIS — Z1159 Encounter for screening for other viral diseases: Secondary | ICD-10-CM | POA: Diagnosis not present

## 2021-10-17 DIAGNOSIS — H540X34 Blindness right eye category 3, blindness left eye category 4: Secondary | ICD-10-CM | POA: Diagnosis not present

## 2021-10-17 DIAGNOSIS — H540X35 Blindness right eye category 3, blindness left eye category 5: Secondary | ICD-10-CM | POA: Diagnosis not present

## 2021-10-22 DIAGNOSIS — Z20828 Contact with and (suspected) exposure to other viral communicable diseases: Secondary | ICD-10-CM | POA: Diagnosis not present

## 2021-10-22 DIAGNOSIS — H540X34 Blindness right eye category 3, blindness left eye category 4: Secondary | ICD-10-CM | POA: Diagnosis not present

## 2021-10-22 DIAGNOSIS — H540X35 Blindness right eye category 3, blindness left eye category 5: Secondary | ICD-10-CM | POA: Diagnosis not present

## 2021-10-22 DIAGNOSIS — Z1159 Encounter for screening for other viral diseases: Secondary | ICD-10-CM | POA: Diagnosis not present

## 2021-10-25 DIAGNOSIS — H540X34 Blindness right eye category 3, blindness left eye category 4: Secondary | ICD-10-CM | POA: Diagnosis not present

## 2021-10-25 DIAGNOSIS — H540X35 Blindness right eye category 3, blindness left eye category 5: Secondary | ICD-10-CM | POA: Diagnosis not present

## 2021-10-29 DIAGNOSIS — H540X35 Blindness right eye category 3, blindness left eye category 5: Secondary | ICD-10-CM | POA: Diagnosis not present

## 2021-10-29 DIAGNOSIS — H540X34 Blindness right eye category 3, blindness left eye category 4: Secondary | ICD-10-CM | POA: Diagnosis not present

## 2021-10-31 DIAGNOSIS — H540X35 Blindness right eye category 3, blindness left eye category 5: Secondary | ICD-10-CM | POA: Diagnosis not present

## 2021-10-31 DIAGNOSIS — Z1159 Encounter for screening for other viral diseases: Secondary | ICD-10-CM | POA: Diagnosis not present

## 2021-10-31 DIAGNOSIS — Z20828 Contact with and (suspected) exposure to other viral communicable diseases: Secondary | ICD-10-CM | POA: Diagnosis not present

## 2021-10-31 DIAGNOSIS — H540X34 Blindness right eye category 3, blindness left eye category 4: Secondary | ICD-10-CM | POA: Diagnosis not present

## 2021-11-02 DIAGNOSIS — Z1159 Encounter for screening for other viral diseases: Secondary | ICD-10-CM | POA: Diagnosis not present

## 2021-11-02 DIAGNOSIS — Z20828 Contact with and (suspected) exposure to other viral communicable diseases: Secondary | ICD-10-CM | POA: Diagnosis not present

## 2021-11-05 DIAGNOSIS — Z20828 Contact with and (suspected) exposure to other viral communicable diseases: Secondary | ICD-10-CM | POA: Diagnosis not present

## 2021-11-05 DIAGNOSIS — H540X35 Blindness right eye category 3, blindness left eye category 5: Secondary | ICD-10-CM | POA: Diagnosis not present

## 2021-11-05 DIAGNOSIS — H540X34 Blindness right eye category 3, blindness left eye category 4: Secondary | ICD-10-CM | POA: Diagnosis not present

## 2021-11-05 DIAGNOSIS — Z1159 Encounter for screening for other viral diseases: Secondary | ICD-10-CM | POA: Diagnosis not present

## 2021-11-06 DIAGNOSIS — H540X34 Blindness right eye category 3, blindness left eye category 4: Secondary | ICD-10-CM | POA: Diagnosis not present

## 2021-11-06 DIAGNOSIS — H540X35 Blindness right eye category 3, blindness left eye category 5: Secondary | ICD-10-CM | POA: Diagnosis not present

## 2021-11-07 DIAGNOSIS — Z20828 Contact with and (suspected) exposure to other viral communicable diseases: Secondary | ICD-10-CM | POA: Diagnosis not present

## 2021-11-07 DIAGNOSIS — Z1159 Encounter for screening for other viral diseases: Secondary | ICD-10-CM | POA: Diagnosis not present

## 2021-11-12 DIAGNOSIS — Z1159 Encounter for screening for other viral diseases: Secondary | ICD-10-CM | POA: Diagnosis not present

## 2021-11-12 DIAGNOSIS — Z20828 Contact with and (suspected) exposure to other viral communicable diseases: Secondary | ICD-10-CM | POA: Diagnosis not present

## 2021-11-14 DIAGNOSIS — Z20828 Contact with and (suspected) exposure to other viral communicable diseases: Secondary | ICD-10-CM | POA: Diagnosis not present

## 2021-11-14 DIAGNOSIS — Z1159 Encounter for screening for other viral diseases: Secondary | ICD-10-CM | POA: Diagnosis not present

## 2021-11-19 DIAGNOSIS — Z20822 Contact with and (suspected) exposure to covid-19: Secondary | ICD-10-CM | POA: Diagnosis not present

## 2021-11-26 DIAGNOSIS — Z20822 Contact with and (suspected) exposure to covid-19: Secondary | ICD-10-CM | POA: Diagnosis not present

## 2021-11-28 DIAGNOSIS — Z20828 Contact with and (suspected) exposure to other viral communicable diseases: Secondary | ICD-10-CM | POA: Diagnosis not present

## 2021-11-28 DIAGNOSIS — Z1159 Encounter for screening for other viral diseases: Secondary | ICD-10-CM | POA: Diagnosis not present

## 2021-12-03 DIAGNOSIS — Z20822 Contact with and (suspected) exposure to covid-19: Secondary | ICD-10-CM | POA: Diagnosis not present

## 2021-12-10 DIAGNOSIS — Z20822 Contact with and (suspected) exposure to covid-19: Secondary | ICD-10-CM | POA: Diagnosis not present

## 2021-12-17 DIAGNOSIS — Z20822 Contact with and (suspected) exposure to covid-19: Secondary | ICD-10-CM | POA: Diagnosis not present

## 2021-12-21 DIAGNOSIS — Z20828 Contact with and (suspected) exposure to other viral communicable diseases: Secondary | ICD-10-CM | POA: Diagnosis not present

## 2021-12-23 ENCOUNTER — Emergency Department (HOSPITAL_COMMUNITY)
Admission: EM | Admit: 2021-12-23 | Discharge: 2021-12-23 | Disposition: A | Discharge status: Home / Self Care | Payer: Medicare HMO | Attending: Emergency Medicine | Admitting: Emergency Medicine

## 2021-12-23 ENCOUNTER — Emergency Department (HOSPITAL_COMMUNITY): Payer: Medicare HMO

## 2021-12-23 ENCOUNTER — Other Ambulatory Visit: Payer: Self-pay

## 2021-12-23 DIAGNOSIS — Z96642 Presence of left artificial hip joint: Secondary | ICD-10-CM | POA: Diagnosis not present

## 2021-12-23 DIAGNOSIS — S0990XA Unspecified injury of head, initial encounter: Secondary | ICD-10-CM | POA: Diagnosis not present

## 2021-12-23 DIAGNOSIS — R531 Weakness: Secondary | ICD-10-CM | POA: Insufficient documentation

## 2021-12-23 DIAGNOSIS — M25561 Pain in right knee: Secondary | ICD-10-CM | POA: Diagnosis not present

## 2021-12-23 DIAGNOSIS — R079 Chest pain, unspecified: Secondary | ICD-10-CM | POA: Diagnosis not present

## 2021-12-23 DIAGNOSIS — I739 Peripheral vascular disease, unspecified: Secondary | ICD-10-CM | POA: Diagnosis not present

## 2021-12-23 DIAGNOSIS — Z7982 Long term (current) use of aspirin: Secondary | ICD-10-CM | POA: Insufficient documentation

## 2021-12-23 DIAGNOSIS — R102 Pelvic and perineal pain: Secondary | ICD-10-CM | POA: Diagnosis not present

## 2021-12-23 DIAGNOSIS — M25512 Pain in left shoulder: Secondary | ICD-10-CM | POA: Diagnosis not present

## 2021-12-23 DIAGNOSIS — S40012A Contusion of left shoulder, initial encounter: Secondary | ICD-10-CM | POA: Diagnosis not present

## 2021-12-23 DIAGNOSIS — M79652 Pain in left thigh: Secondary | ICD-10-CM | POA: Diagnosis not present

## 2021-12-23 DIAGNOSIS — S59911A Unspecified injury of right forearm, initial encounter: Secondary | ICD-10-CM | POA: Diagnosis present

## 2021-12-23 DIAGNOSIS — W06XXXA Fall from bed, initial encounter: Secondary | ICD-10-CM | POA: Diagnosis not present

## 2021-12-23 DIAGNOSIS — S72001A Fracture of unspecified part of neck of right femur, initial encounter for closed fracture: Secondary | ICD-10-CM | POA: Diagnosis not present

## 2021-12-23 DIAGNOSIS — S5780XA Crushing injury of unspecified forearm, initial encounter: Secondary | ICD-10-CM | POA: Diagnosis not present

## 2021-12-23 DIAGNOSIS — S8011XA Contusion of right lower leg, initial encounter: Secondary | ICD-10-CM | POA: Insufficient documentation

## 2021-12-23 DIAGNOSIS — S8012XA Contusion of left lower leg, initial encounter: Secondary | ICD-10-CM | POA: Diagnosis not present

## 2021-12-23 DIAGNOSIS — I700 Atherosclerosis of aorta: Secondary | ICD-10-CM | POA: Diagnosis not present

## 2021-12-23 DIAGNOSIS — R9082 White matter disease, unspecified: Secondary | ICD-10-CM | POA: Diagnosis not present

## 2021-12-23 DIAGNOSIS — Z79899 Other long term (current) drug therapy: Secondary | ICD-10-CM | POA: Insufficient documentation

## 2021-12-23 DIAGNOSIS — W19XXXA Unspecified fall, initial encounter: Secondary | ICD-10-CM

## 2021-12-23 DIAGNOSIS — R5381 Other malaise: Secondary | ICD-10-CM | POA: Diagnosis not present

## 2021-12-23 DIAGNOSIS — R4182 Altered mental status, unspecified: Secondary | ICD-10-CM | POA: Diagnosis not present

## 2021-12-23 DIAGNOSIS — Z7401 Bed confinement status: Secondary | ICD-10-CM | POA: Diagnosis not present

## 2021-12-23 DIAGNOSIS — R5383 Other fatigue: Secondary | ICD-10-CM | POA: Diagnosis not present

## 2021-12-23 DIAGNOSIS — S51811A Laceration without foreign body of right forearm, initial encounter: Secondary | ICD-10-CM | POA: Insufficient documentation

## 2021-12-23 DIAGNOSIS — R52 Pain, unspecified: Secondary | ICD-10-CM

## 2021-12-23 LAB — COMPREHENSIVE METABOLIC PANEL
ALT: 14 U/L (ref 0–44)
AST: 18 U/L (ref 15–41)
Albumin: 3.3 g/dL — ABNORMAL LOW (ref 3.5–5.0)
Alkaline Phosphatase: 58 U/L (ref 38–126)
Anion gap: 8 (ref 5–15)
BUN: 15 mg/dL (ref 8–23)
CO2: 25 mmol/L (ref 22–32)
Calcium: 8.8 mg/dL — ABNORMAL LOW (ref 8.9–10.3)
Chloride: 106 mmol/L (ref 98–111)
Creatinine, Ser: 1.27 mg/dL — ABNORMAL HIGH (ref 0.44–1.00)
GFR, Estimated: 39 mL/min — ABNORMAL LOW (ref >60)
Glucose, Bld: 150 mg/dL — ABNORMAL HIGH (ref 70–99)
Potassium: 3.8 mmol/L (ref 3.5–5.1)
Sodium: 139 mmol/L (ref 135–145)
Total Bilirubin: 0.5 mg/dL (ref 0.3–1.2)
Total Protein: 6.5 g/dL (ref 6.5–8.1)

## 2021-12-23 LAB — CBC WITH DIFFERENTIAL/PLATELET
Abs Immature Granulocytes: 0.1 10*3/uL — ABNORMAL HIGH (ref 0.00–0.07)
Basophils Absolute: 0 10*3/uL (ref 0.0–0.1)
Basophils Relative: 0 %
Eosinophils Absolute: 0.2 10*3/uL (ref 0.0–0.5)
Eosinophils Relative: 2 %
HCT: 38.2 % (ref 36.0–46.0)
Hemoglobin: 12.5 g/dL (ref 12.0–15.0)
Immature Granulocytes: 1 %
Lymphocytes Relative: 6 %
Lymphs Abs: 0.7 10*3/uL (ref 0.7–4.0)
MCH: 30.6 pg (ref 26.0–34.0)
MCHC: 32.7 g/dL (ref 30.0–36.0)
MCV: 93.6 fL (ref 80.0–100.0)
Monocytes Absolute: 0.7 10*3/uL (ref 0.1–1.0)
Monocytes Relative: 6 %
Neutro Abs: 9.9 10*3/uL — ABNORMAL HIGH (ref 1.7–7.7)
Neutrophils Relative %: 85 %
Platelets: 118 10*3/uL — ABNORMAL LOW (ref 150–400)
RBC: 4.08 MIL/uL (ref 3.87–5.11)
RDW: 13.6 % (ref 11.5–15.5)
WBC: 11.6 10*3/uL — ABNORMAL HIGH (ref 4.0–10.5)
nRBC: 0 % (ref 0.0–0.2)

## 2021-12-23 LAB — URINALYSIS, ROUTINE W REFLEX MICROSCOPIC
Bilirubin Urine: NEGATIVE
Glucose, UA: NEGATIVE mg/dL
Hgb urine dipstick: NEGATIVE
Ketones, ur: NEGATIVE mg/dL
Leukocytes,Ua: NEGATIVE
Nitrite: NEGATIVE
Protein, ur: NEGATIVE mg/dL
Specific Gravity, Urine: 1.01 (ref 1.005–1.030)
pH: 8 (ref 5.0–8.0)

## 2021-12-23 MED ORDER — LACTATED RINGERS IV BOLUS: 1000.0000 mL | Freq: Once | INTRAVENOUS | Status: DC

## 2021-12-23 MED ORDER — ACETAMINOPHEN 500 MG PO TABS
1000.0000 mg | ORAL_TABLET | Freq: Once | ORAL | Status: AC
Start: 2021-12-23 — End: 2021-12-23
  Administered 2021-12-23: 1000 mg via ORAL
  Filled 2021-12-23: qty 2

## 2021-12-23 NOTE — ED Notes (Signed)
Skin tears to RFA cleaned with water. Vaseline gauze applied, the nonadherent pad, and wrapped with Kerlix. Tolerated well.

## 2021-12-23 NOTE — ED Provider Notes (Signed)
[facility] EMERGENCY DEPARTMENT Provider Note   CSN: [id number] Arrival date & time: 12/23/21  0421     Histor Chief Complaint  Patient presents with   Marletta Lor    [name] is a 88 y.o. female.  Patient presents for a fall today.  Apparently the multiple falls over the last week or 2 related to generalized weakness.  He supposed to use a wheelchair.  The fall that she is here for today was when she tried to transfer from a bed to a wheelchair on her own and fell down getting pinned between wheelchair and bed.  She suffered a couple skin tears to her right arm in the process.  Did not hit her head or syncopized that she knows of.  Having some pain around her knee.  No other injuries   Fall      Home Medications Prior to Admission medications   Medication Sig Start Date End Date Taking? Authorizing Provider  acetaminophen (TYLENOL) 325 MG tablet Take 325 mg by mouth every 6 (six) hours as needed for moderate pain.   Yes [provider]  acetaminophen (TYLENOL) 500 MG tablet Take 500 mg by mouth daily.   Yes [provider]  albuterol (VENTOLIN HFA) 108 (90 Base) MCG/ACT inhaler Inhale 2 puffs into the lungs every 4 (four) hours as needed for wheezing or shortness of breath.   Yes [provider]  ALPRAZolam Prudy Feeler) 1 MG tablet Take 1 tablet (1 mg total) by mouth 3 (three) times daily as needed for anxiety. Patient taking differently: Take 1.5 mg by mouth 3 (three) times daily. 04/11/21  Yes Rhetta Mura, MD  Alum & Mag Hydroxide-Simeth (GERI-LANTA PO) Take 30 mLs by mouth in the morning and at bedtime.   Yes [provider]  aspirin 81 MG chewable tablet Chew 81 mg by mouth daily.   Yes [provider]  Benzocaine-Menthol (CHLORASEPTIC SORE THROAT MT) Use as directed 2 sprays in the mouth or throat every 4 (four) hours as needed (sore throat).   Yes [provider]  benzonatate (TESSALON) 100 MG  capsule Take 100 mg by mouth 3 (three) times daily as needed for cough.   Yes [provider]  bisacodyl (DULCOLAX) 5 MG EC tablet Take 10 mg by mouth daily as needed for moderate constipation.   Yes [provider]  carvedilol (COREG) 6.25 MG tablet Take 6.25 mg by mouth 2 (two) times daily. 09/03/17  Yes [provider]  colestipol (COLESTID) 1 g tablet Take 2 tablets (2 g total) by mouth daily. Hold FOR CONSTIPATION 12/11/17  Yes Dow Adolph N, DO  docusate sodium (COLACE) 100 MG capsule Take 100 mg by mouth 2 (two) times daily.   Yes [provider]  ferrous sulfate 325 (65 FE) MG tablet Take 1 tablet (325 mg total) by mouth daily with breakfast. 04/12/21  Yes Rhetta Mura, MD  guaiFENesin (MUCINEX) 600 MG 12 hr tablet Take 600 mg by mouth 2 (two) times daily as needed for cough (congestion).   Yes [provider]  guaifenesin (ROBITUSSIN) 100 MG/5ML syrup Take 100 mg by mouth every 8 (eight) hours as needed for cough.   Yes [provider]  HYDROcodone-acetaminophen (NORCO/VICODIN) 5-325 MG tablet Take 1 tablet by mouth every 6 (six) hours as needed for moderate pain.   Yes [provider]  hydrOXYzine (ATARAX) 10 MG tablet Take 10 mg by mouth 3 (three) times daily as needed for itching.  Yes [provider]  lactobacillus acidophilus & bulgar (LACTINEX) chewable tablet Chew 1 tablet by mouth daily.   Yes [provider]  levothyroxine (SYNTHROID, LEVOTHROID) 25 MCG tablet Take 25 mcg by mouth daily before breakfast.   Yes [provider]  loperamide (IMODIUM A-D) 2 MG tablet Take 2-4 mg by mouth See admin instructions. 4 mg after first loose stool, then 2 mg after each loose stool   Yes [provider]  losartan (COZAAR) 50 MG tablet Take 0.5 tablets (25 mg total) by mouth daily. 04/11/21  Yes Nita Sells, MD  magnesium hydroxide (MILK OF MAGNESIA) 400 MG/5ML suspension Take by mouth  daily as needed for mild constipation.   Yes [provider]  Menthol, Topical Analgesic, (BIOFREEZE) 4 % GEL Apply 1 application topically 3 (three) times daily as needed (bilateral hip pain).   Yes [provider]  Menthol-Zinc Oxide (GOLD BOND EX) Apply 1 application topically 2 (two) times daily as needed (itching).   Yes [provider]  mirabegron ER (MYRBETRIQ) 25 MG TB24 tablet Take 25 mg by mouth daily.   Yes [provider]  mirtazapine (REMERON) 30 MG tablet Take 1 tablet (30 mg total) by mouth at bedtime. 04/11/21  Yes Nita Sells, MD  Multiple Vitamins-Minerals (PRESERVISION AREDS PO) Take 1 capsule by mouth daily.   Yes [provider]  NON FORMULARY Take 2.5 mLs by mouth 3 (three) times daily as needed (sore throat). Salt and water solution   Yes [provider]  ondansetron (ZOFRAN) 4 MG tablet Take 4 mg by mouth daily as needed for nausea or vomiting.   Yes [provider]  pantoprazole (PROTONIX) 40 MG tablet Take 1 tablet (40 mg total) by mouth 2 (two) times daily. 04/11/21  Yes Nita Sells, MD  polyethylene glycol powder (GLYCOLAX/MIRALAX) 17 GM/SCOOP powder Take 17 g by mouth daily.   Yes [provider]  Pramoxine-Camphor-Zinc Acetate (ANTI ITCH EX) Apply 1 application topically 3 (three) times daily as needed (itching).   Yes [provider]  sertraline (ZOLOFT) 100 MG tablet Take 1 tablet (100 mg total) by mouth daily. 04/11/21  Yes Nita Sells, MD  simvastatin (ZOCOR) 40 MG tablet Take 40 mg by mouth at bedtime.   Yes [provider]  spironolactone (ALDACTONE) 25 MG tablet Take 0.5 tablets (12.5 mg total) by mouth daily. 02/21/17  Yes Josue Hector, MD  torsemide (DEMADEX) 10 MG tablet Take 1 tablet (10 mg total) by mouth daily. 04/12/21  Yes Nita Sells, MD  torsemide (DEMADEX) 20 MG tablet Take 20 mg by mouth daily as needed (leg swelling).   Yes  [provider]  triamcinolone cream (KENALOG) 0.1 % 1 application 2 (two) times daily as needed (itching). perineum   Yes [provider]  Zinc Oxide (DESITIN) 13 % CREA Apply 1 application topically See admin instructions. Cleanse groin area under breast with soap and water, pat dry and apply ointment on area as needed for redness.   Yes [provider]      Allergies    Fosamax [alendronate sodium], Penicillins, Sulfonamide derivatives, and Tetanus toxoid    Review of Systems   Review of Systems  Physical Exam Updated Vital Signs BP 129/70    Pulse 87    Temp 97.9 F (36.6 C) (Oral)    Resp 16    Ht 6' (1.829 m)    Wt 92.5 kg    SpO2 97%    BMI 27.66 kg/m  Physical Exam Vitals and nursing note reviewed.  Constitutional:      Appearance: She is well-developed.  HENT:     Head: Normocephalic and atraumatic.     Mouth/Throat:     Mouth: Mucous membranes are moist.     Pharynx: Oropharynx is clear.  Eyes:     Pupils: Pupils are equal, round, and reactive to light.  Cardiovascular:     Rate and Rhythm: Normal rate and regular rhythm.  Pulmonary:     Effort: Pulmonary effort is normal. No respiratory distress.     Breath sounds: No stridor.  Abdominal:     General: Abdomen is flat. There is no distension.  Musculoskeletal:     Cervical back: Normal range of motion.  Skin:    Comments: Some superficial skin tears to her right forearm.  On examination, she has multiple bruises to shoulders L>R, arms, legs, torso. She states they are all form falls. Denies abuse.   Neurological:     General: No focal deficit present.     Mental Status: She is alert.    ED Results / Procedures / Treatments   Labs (all labs ordered are listed, but only abnormal results are displayed) Labs Reviewed  CBC WITH DIFFERENTIAL/PLATELET - Abnormal; Notable for the following components:      Result Value   WBC 11.6 (*)    Platelets 118 (*)    Neutro Abs 9.9 (*)    Abs  Immature Granulocytes 0.10 (*)    All other components within normal limits  COMPREHENSIVE METABOLIC PANEL - Abnormal; Notable for the following components:   Glucose, Bld 150 (*)    Creatinine, Ser 1.27 (*)    Calcium 8.8 (*)    Albumin 3.3 (*)    GFR, Estimated 39 (*)    All other components within normal limits  URINALYSIS, ROUTINE W REFLEX MICROSCOPIC    EKG None  Radiology DG Chest 1 View  Result Date: 12/23/2021 CLINICAL DATA:  Knee and back pain. EXAM: PORTABLE CHEST 1 VIEW COMPARISON:  Chest XR, most recently 04/08/2019. CT chest, 12/09/2017. FINDINGS: Cardiac silhouette is within normal limits. Tortuous thoracic aorta with calcifications. No focal consolidation or mass. No pleural effusion or pneumothorax. Overlying pacer. No acute displaced fracture. IMPRESSION: 1. No acute cardiopulmonary process. 2.  Aortic Atherosclerosis (ICD10-I70.0). Electronically Signed   By: Michaelle Birks M.D.   On: 12/23/2021 06:24   DG Pelvis 1-2 Views  Result Date: 12/23/2021 CLINICAL DATA:  88 year old female status post fall from bed trying to transfer true wheelchair. Pain. EXAM: PELVIS - 1-2 VIEW COMPARISON:  Bilateral femur series today. CT Abdomen and Pelvis 04/06/2021. FINDINGS: Chronic bilateral hip arthroplasty, bipolar type on the right. Chronic right inferior pubic ramus fracture appears stable. No acute pelvis fracture. Osteopenia. SI joints appear symmetric. Negative visible bowel gas. Both femurs reported separately. IMPRESSION: No acute fracture or dislocation identified about the pelvis. Electronically Signed   By: Genevie Ann M.D.   On: 12/23/2021 06:36   DG Shoulder Left  Result Date: 12/23/2021 CLINICAL DATA:  88 year old female status post fall from bed trying to transfer true wheelchair. Pain. EXAM: LEFT SHOULDER - 2+ VIEW COMPARISON:  Left shoulder series 02/24/2017. FINDINGS: No glenohumeral joint dislocation. Stable bone mineralization. Intact proximal left humerus. Left clavicle and  scapula appear intact with small chronic ossific fragment at the left [facility] joint. Calcified aortic atherosclerosis. Otherwise negative visible left ribs, chest. IMPRESSION: No acute fracture or dislocation identified about the left shoulder. Electronically Signed  By: Genevie Ann M.D.   On: 12/23/2021 06:37   DG Femur Min 2 Views Left  Result Date: 12/23/2021 CLINICAL DATA:  88 year old female status post fall from bed trying to transfer true wheelchair. Pain. EXAM: LEFT FEMUR 2 VIEWS COMPARISON:  Left femur series 06/21/2014. FINDINGS: Chronic left femoral arthroplasty. Femoral head component appears stable and normally located. Left femur appears stable and intact. No knee joint effusion evident on cross-table lateral. Grossly maintained knee joint alignment. Mild for age calcified peripheral vascular disease. IMPRESSION: No acute fracture or dislocation identified about the left femur. Chronic left hip arthroplasty. Electronically Signed   By: Genevie Ann M.D.   On: 12/23/2021 06:33   DG Femur Min 2 Views Right  Result Date: 12/23/2021 CLINICAL DATA:  88 year old female status post fall from bed trying to transfer true wheelchair. Pain. EXAM: RIGHT FEMUR 2 VIEWS COMPARISON:  Right hip series 09/01/2009. FINDINGS: Bipolar type right hip arthroplasty is new since the 2010 study which demonstrated acute proximal femur fracture. Components appear intact and normally located. Distal left femur intact. No evidence of knee joint effusion. Maintained alignment at the right knee. More advanced distal calcified peripheral vascular disease in the right lower extremity. Grossly intact visible pelvis. IMPRESSION: Right hip arthroplasty since 2010. No acute fracture or dislocation identified about the right femur. Electronically Signed   By: Genevie Ann M.D.   On: 12/23/2021 06:35    Procedures Procedures    Medications Ordered in ED Medications - No data to display  ED Course/ Medical Decision Making/ A&P Clinical Course  as of 12/23/21 [id number]  Sun Dec 23, 2021  0622 Xrays reviewed by myself with e/o osteoarthritis and chronic changes but no obvious acute changes. Pending radiology read.  [JM]    Clinical Course User Index [JM] Lyn Joens, Corene Cornea, MD                           Medical Decision Making Amount and/or Complexity of Data Reviewed Labs: ordered. Radiology: ordered.  Risk OTC drugs.   Patient without any evidence of bony fracture all seems to be soft tissue.  Wounds to be cleaned and dressed by nursing.On examination, she has multiple bruises to shoulders L>R, arms, legs, torso. She states they are all form falls. Denies abuse.  Patient is pending urine at time of checkout either way I think she go back to her facility.         Final Clinical Impression(s) / ED Diagnoses Final diagnoses:  Pain    Rx / DC Orders ED Discharge Orders     None         Daniyah Fohl, Corene Cornea, MD 12/23/21 [id number]

## 2021-12-23 NOTE — ED Notes (Signed)
Pt requesting pain meds. Secure chat sent to Dr. Rodena Medin.

## 2021-12-23 NOTE — ED Notes (Signed)
Attempted to call niece and POA, Adline Peals, to notify of plan to discharge back to [facility]. No answer, no VM set up.

## 2021-12-23 NOTE — ED Provider Notes (Signed)
Patient seen after prior EDP.  Patient presented for evaluation after reported fall from standing  She is comfortable now after ED evaluation.  She desires DC home.  Skin tears on right arm have been cleaned and dressed.  ED work-up is without evidence of other acute pathology.  Importance of close follow-up was stressed.  Strict return precautions given and understood.    Wynetta Fines, MD 12/23/21 [phone]

## 2021-12-23 NOTE — Discharge Instructions (Signed)
Return for any problem.  ?

## 2021-12-23 NOTE — ED Triage Notes (Signed)
Pt brought in by EMS from [facility] for a fall from the bed while trying to get into her wheel chair. Pt c/o knee and back pain.

## 2021-12-24 DIAGNOSIS — Z20822 Contact with and (suspected) exposure to covid-19: Secondary | ICD-10-CM | POA: Diagnosis not present

## 2022-02-11 IMAGING — DX DG CHEST 1V PORT
1 series · 1 of 1 positions shown · non-contrast
Comparison: April 06, 2021

CLINICAL DATA: Aspiration into airway.

EXAM:
PORTABLE CHEST 1 VIEW

[chest ap]
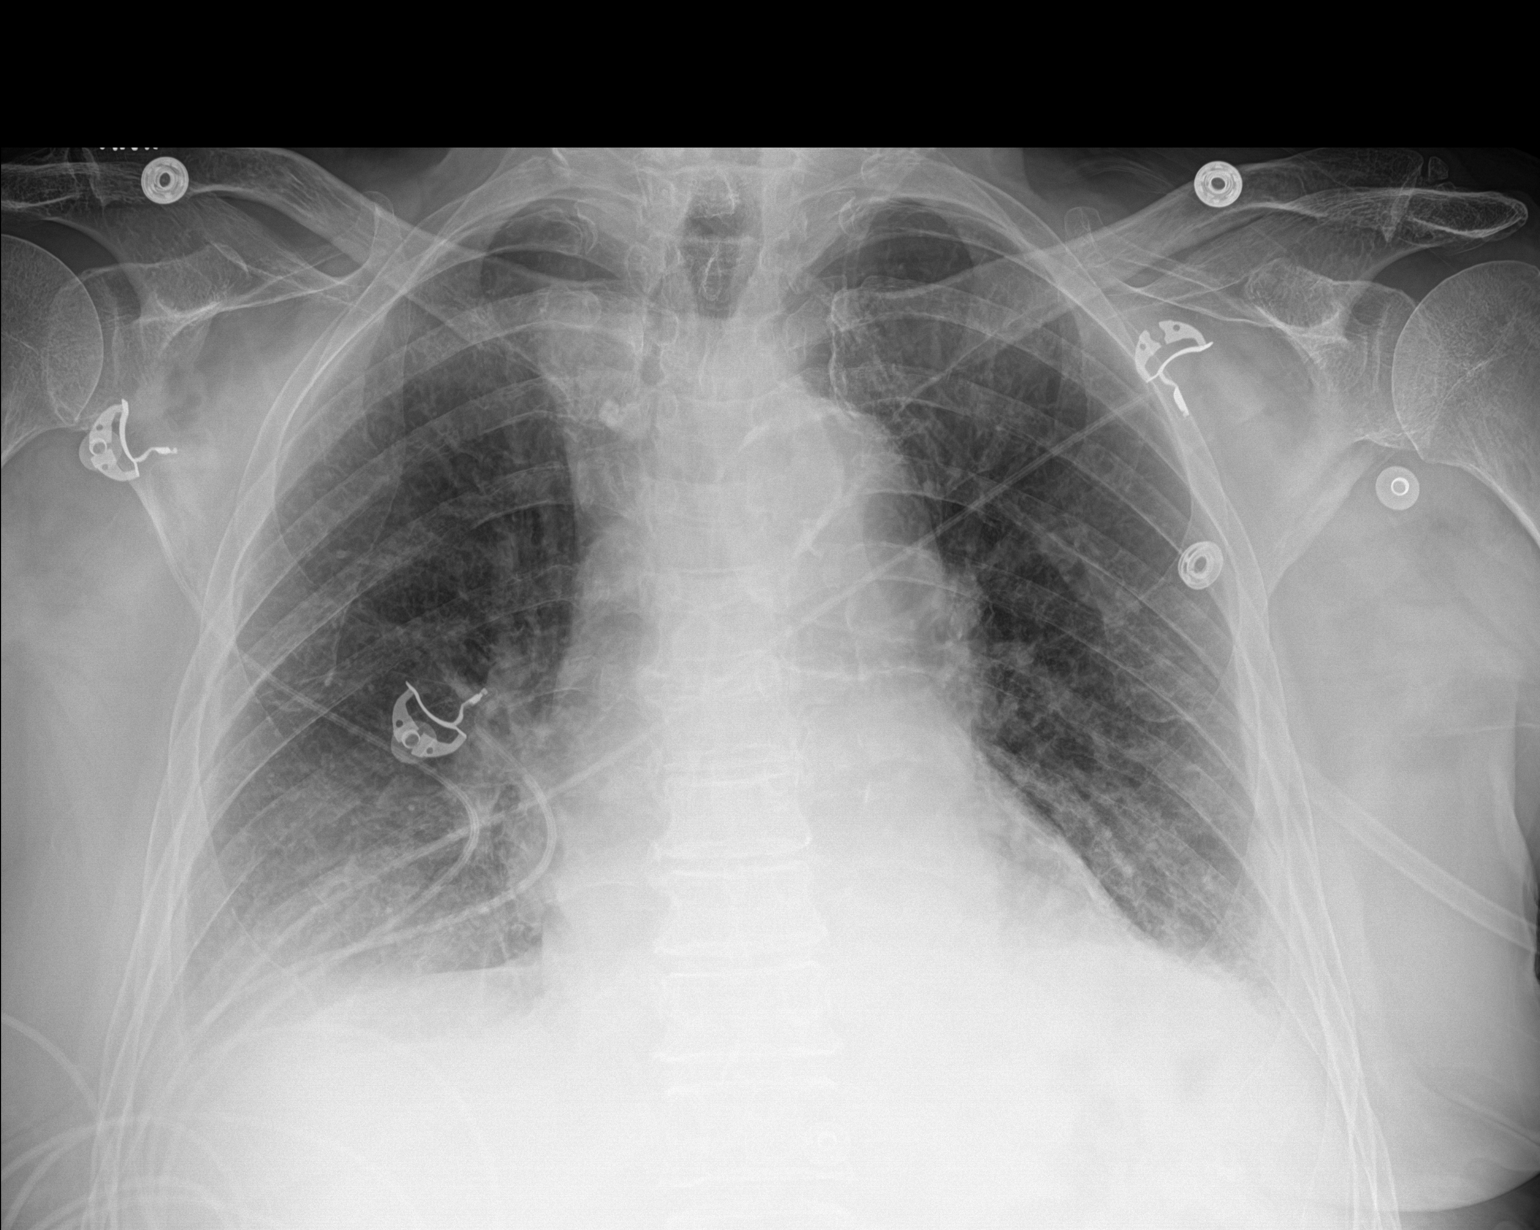

[1 of 1 positions shown; findings below may reference images not displayed]

FINDINGS: There is hazy opacity in the right base. There may be tiny bilateral
effusions. Minimal atelectasis in the left base. The
cardiomediastinal silhouette is stable. No pneumothorax. No other
abnormalities.
IMPRESSION: 1. Hazy opacity in the right base could represent effusion and
underlying atelectasis, developing infiltrate such as pneumonia, or
aspiration.
2. Possible tiny left effusion with underlying atelectasis.

## 2022-02-11 IMAGING — DX DG ABDOMEN 1V
2 series · 2 of 2 positions shown · non-contrast
Comparison: 10/19/2020

CLINICAL DATA: Left upper quadrant pain

EXAM:
ABDOMEN - 1 VIEW

[abdomen kub (1 of 2)]
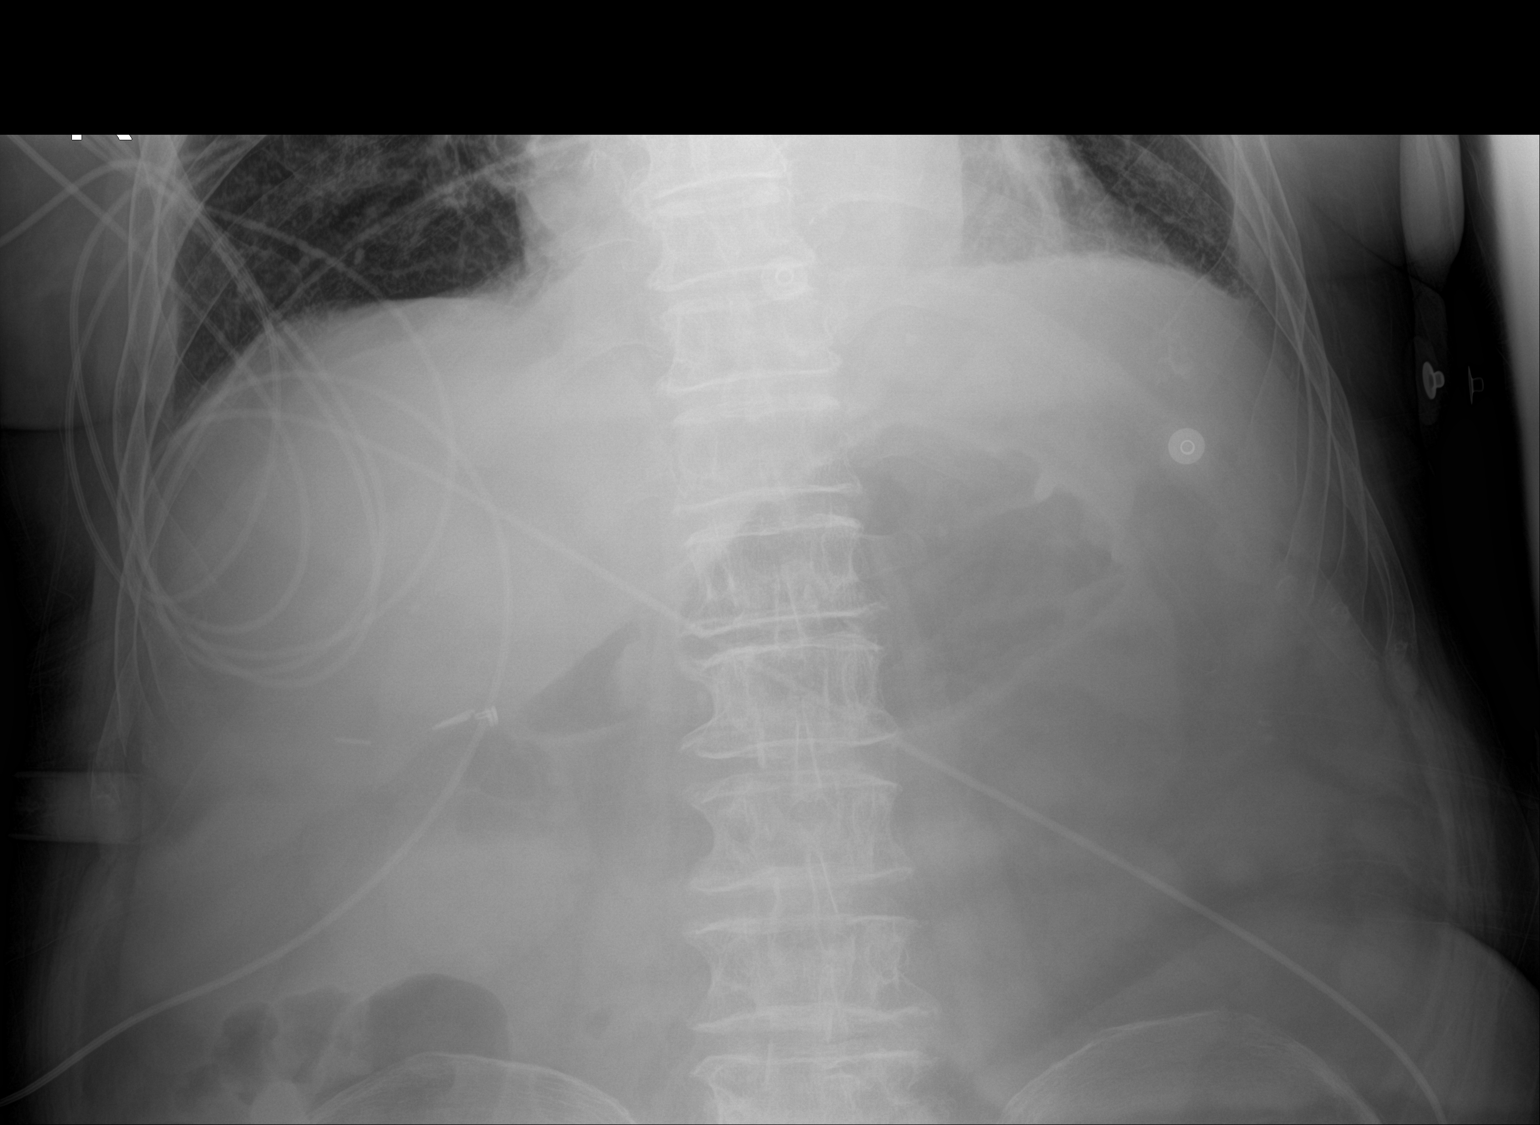

[abdomen kub (2 of 2)]
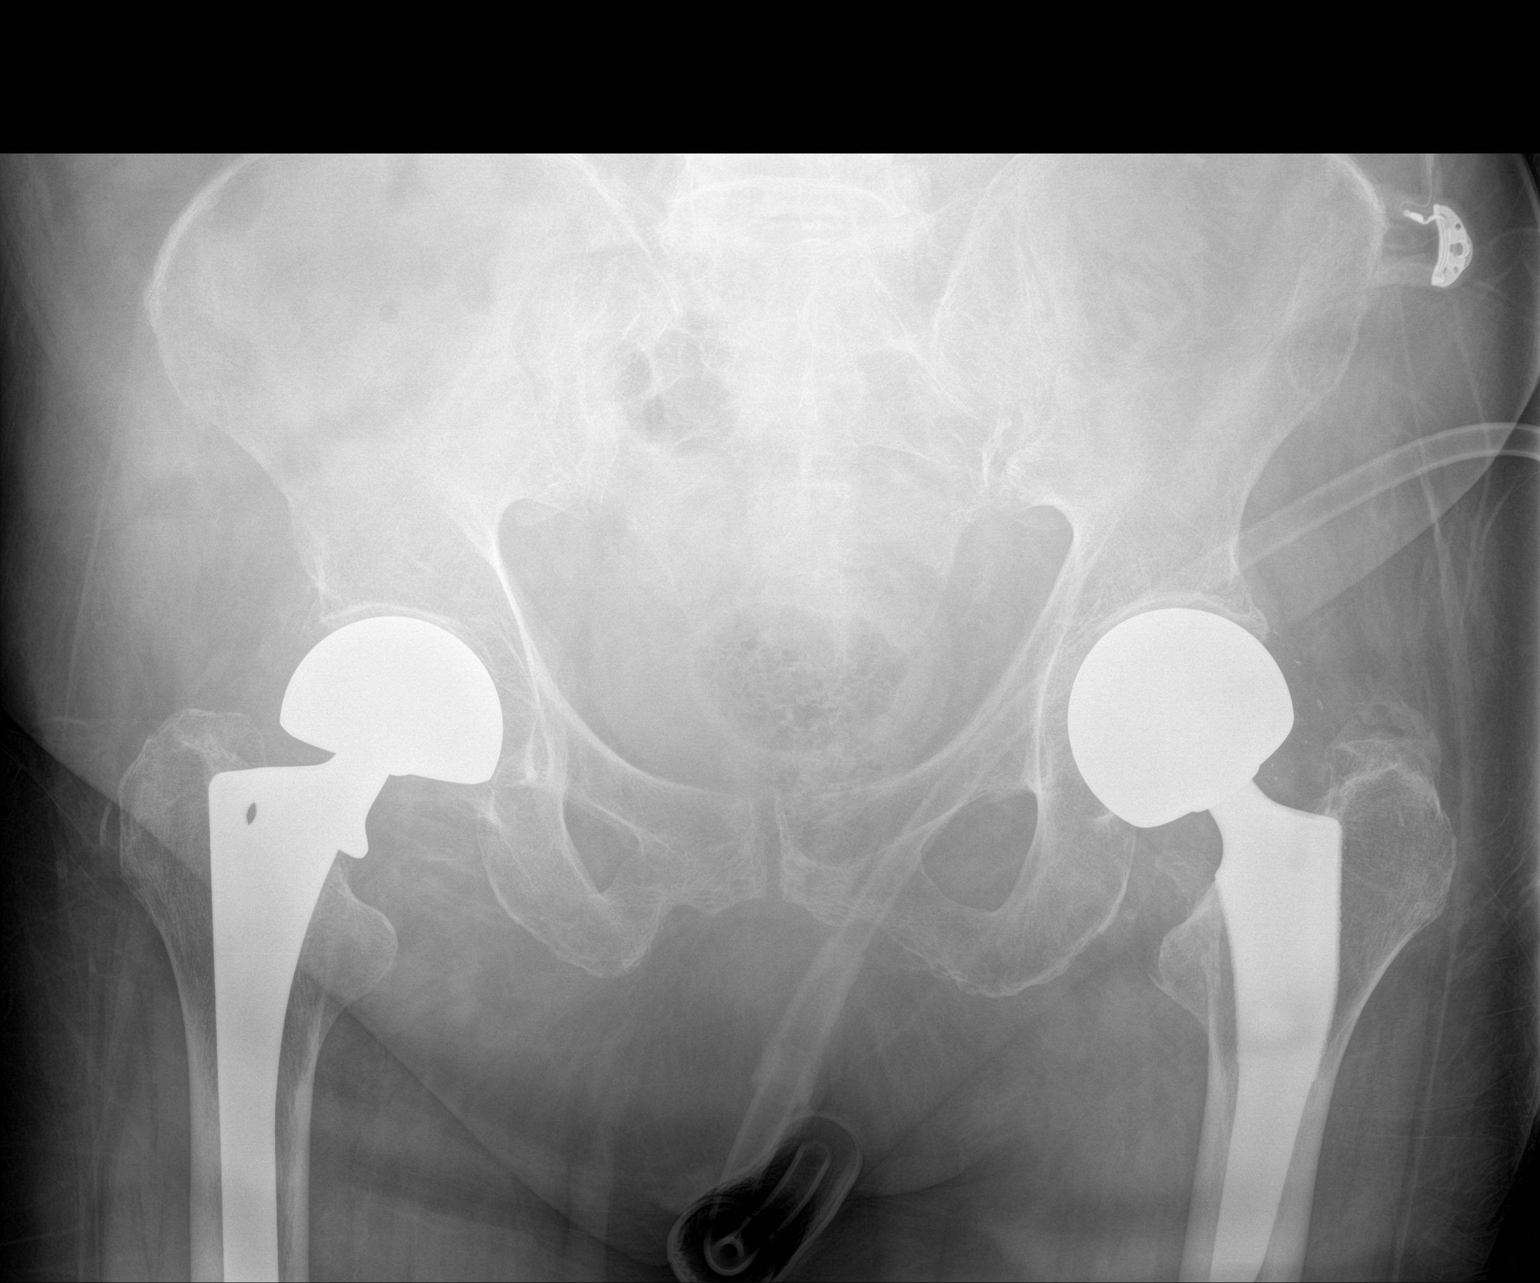

[2 of 2 positions shown; findings below may reference images not displayed]

FINDINGS: Prior CABG. Nonobstructed bowel gas pattern. No organomegaly, free
air or suspicious calcification. Bilateral hip replacements.
IMPRESSION: No acute findings.

## 2022-02-25 DIAGNOSIS — Z20822 Contact with and (suspected) exposure to covid-19: Secondary | ICD-10-CM | POA: Diagnosis not present

## 2022-03-08 ENCOUNTER — Other Ambulatory Visit: Payer: Medicare HMO | Admitting: Hospice

## 2022-03-08 DIAGNOSIS — Z515 Encounter for palliative care: Secondary | ICD-10-CM

## 2022-03-08 DIAGNOSIS — I10 Essential (primary) hypertension: Secondary | ICD-10-CM

## 2022-03-08 DIAGNOSIS — F419 Anxiety disorder, unspecified: Secondary | ICD-10-CM

## 2022-03-08 DIAGNOSIS — I509 Heart failure, unspecified: Secondary | ICD-10-CM

## 2022-03-08 NOTE — Progress Notes (Signed)
? ? ?Manufacturing engineer ?Community Palliative Care Consult Note ?Telephone: [phone]  ?Fax: [phone] ? ?PATIENT NAME: [name] ?DOB: [birth year] ?MRN: [id number] ? ?PRIMARY CARE PROVIDER:   Mayra Neer, MD ?Kristen Neer, MD ?[address] ?[address] ?[place],  [place] [address] ? ?REFERRING PROVIDER: Mayra Neer, MD ?Kristen Neer, MD ?[address] ?[address] ?[place],  [place] [address] ? ?RESPONSIBLE PARTY:  self/[place] [name] - Niece. POA [phone] ?Contact Information   ? ? Name Relation Home Work Mobile  ? [name] Niece [phone]  [phone]  ? [name] Niece [id number]  [phone]  ? [name] Friend   [phone]  ? ?  ? ? ?Visit is to build trust and highlight Palliative Medicine as specialized medical care for people living with serious illness, aimed at facilitating better quality of life through symptoms relief, assisting with advance care planning and complex medical decision making. NP called [name] and left her a vm with call back number. This is a follow up visit. ? ?RECOMMENDATIONS/PLAN:  ? ?Advance Care Planning/Code Status: Patient is a Do Not Resuscitate ? ?Goals of Care: Goals of care include to maximize quality of life and symptom management. ? ?Visit consisted of counseling and education dealing with the complex and emotionally intense issues of symptom management and palliative care in the setting of serious and potentially life-threatening illness. Palliative care team will continue to support patient, patient's family, and medical team. ? ?Symptom management/Plan:  ?CHF: Continue Torsemide, Aldactone. Elevate BLE during the day as much as possible to promote circulation. Adhere to Fluid and salt limits. Monitor weight closely and report weight gain of 2 Ibs in a day or 5 Ibs in a week.  ?Routine CBC CMP ?Hypertension: Coreg, Losartan as ordered. Followed by Cardiologist ?Anxiety/depression: Continue Xanax. Zoloft,  [place]on. Meditation, slow deep breathing encouraged. ?Follow up: Palliative care will continue to follow for complex medical decision making, advance care planning, and clarification of goals. Return 6 weeks or prn. Encouraged to call provider sooner with any concerns. ? ?CHIEF COMPLAINT: Palliative follow up ? ?HISTORY OF PRESENT ILLNESS:  [name] a 88 y.o. female with multiple morbidities requiring close monitoring/management with high risk of complications and morbidity: CHF, HTN Depression, Anxiety, Afib, GERD, left eye blindness, low vision in the right eye. She denied pain or shortness of breath; endorsed occasional itching in her trunk for which anti itch lotion is effective.  History obtained from review of EMR, discussion with primary team, family and/or patient. Records reviewed and summarized above. All 10 point systems reviewed and are negative except as documented in history of present illness above ? ?Review and summarization of Epic records shows history from other than patient.  ? ?Palliative Care was asked to follow this patient o help address complex decision making in the context of advance care planning and goals of care clarification.  ?I reviewed patient records, labs, notes, testing and imaging myself as needed and where available. ? ?PHYSICAL EXAM  ?General: In no acute distress, appropriately dressed ?Cardiovascular: regular rate and rhythm; no edema in BLE ?Pulmonary: no cough, normal respiratory effort, 95% oxygen saturation on room air.  ?Abdomen: soft, non tender, no guarding, positive bowel sounds in all quadrants ?GU:  no suprapubic tenderness ?Eyes: Normal lids, no discharge ?ENMT: Moist mucous membranes ?Musculoskeletal:  weakness, ambulatory with rolling walker ?Skin: no rash to visible skin, warm without cyanosis,  ?Psych: coping ?Neurological: Weakness but otherwise non focal ?Heme/lymph/immuno: no bruises, no bleeding ? ?PERTINENT MEDICATIONS:  ?  Outpatient Encounter  Medications as of 03/08/2022  ?Medication Sig  ? acetaminophen (TYLENOL) 325 MG tablet Take 325 mg by mouth every 6 (six) hours as needed for moderate pain.  ? acetaminophen (TYLENOL) 500 MG tablet Take 500 mg by mouth daily.  ? albuterol (VENTOLIN HFA) 108 (90 Base) MCG/ACT inhaler Inhale 2 puffs into the lungs every 4 (four) hours as needed for wheezing or shortness of breath.  ? ALPRAZolam (XANAX) 1 MG tablet Take 1 tablet (1 mg total) by mouth 3 (three) times daily as needed for anxiety. (Patient taking differently: Take 1.5 mg by mouth 3 (three) times daily.)  ? Alum & Mag Hydroxide-Simeth (GERI-LANTA PO) Take 30 mLs by mouth in the morning and at bedtime.  ? aspirin 81 MG chewable tablet Chew 81 mg by mouth daily.  ? Benzocaine-Menthol (CHLORASEPTIC SORE THROAT MT) Use as directed 2 sprays in the mouth or throat every 4 (four) hours as needed (sore throat).  ? benzonatate (TESSALON) 100 MG capsule Take 100 mg by mouth 3 (three) times daily as needed for cough.  ? bisacodyl (DULCOLAX) 5 MG EC tablet Take 10 mg by mouth daily as needed for moderate constipation.  ? carvedilol (COREG) 6.25 MG tablet Take 6.25 mg by mouth 2 (two) times daily.  ? colestipol (COLESTID) 1 g tablet Take 2 tablets (2 g total) by mouth daily. Hold FOR CONSTIPATION  ? docusate sodium (COLACE) 100 MG capsule Take 100 mg by mouth 2 (two) times daily.  ? ferrous sulfate 325 (65 FE) MG tablet Take 1 tablet (325 mg total) by mouth daily with breakfast.  ? guaiFENesin (MUCINEX) 600 MG 12 hr tablet Take 600 mg by mouth 2 (two) times daily as needed for cough (congestion).  ? guaifenesin (ROBITUSSIN) 100 MG/5ML syrup Take 100 mg by mouth every 8 (eight) hours as needed for cough.  ? HYDROcodone-acetaminophen (NORCO/VICODIN) 5-325 MG tablet Take 1 tablet by mouth every 6 (six) hours as needed for moderate pain.  ? hydrOXYzine (ATARAX) 10 MG tablet Take 10 mg by mouth 3 (three) times daily as needed for itching.  ? lactobacillus acidophilus &  bulgar (LACTINEX) chewable tablet Chew 1 tablet by mouth daily.  ? levothyroxine (SYNTHROID, LEVOTHROID) 25 MCG tablet Take 25 mcg by mouth daily before breakfast.  ? loperamide (IMODIUM A-D) 2 MG tablet Take 2-4 mg by mouth See admin instructions. 4 mg after first loose stool, then 2 mg after each loose stool  ? losartan (COZAAR) 50 MG tablet Take 0.5 tablets (25 mg total) by mouth daily.  ? magnesium hydroxide (MILK OF MAGNESIA) 400 MG/5ML suspension Take by mouth daily as needed for mild constipation.  ? Menthol, Topical Analgesic, (BIOFREEZE) 4 % GEL Apply 1 application topically 3 (three) times daily as needed (bilateral hip pain).  ? Menthol-Zinc Oxide (GOLD BOND EX) Apply 1 application topically 2 (two) times daily as needed (itching).  ? mirabegron ER (MYRBETRIQ) 25 MG TB24 tablet Take 25 mg by mouth daily.  ? mirtazapine (REMERON) 30 MG tablet Take 1 tablet (30 mg total) by mouth at bedtime.  ? Multiple Vitamins-Minerals (PRESERVISION AREDS PO) Take 1 capsule by mouth daily.  ? NON FORMULARY Take 2.5 mLs by mouth 3 (three) times daily as needed (sore throat). Salt and water solution  ? ondansetron (ZOFRAN) 4 MG tablet Take 4 mg by mouth daily as needed for nausea or vomiting.  ? pantoprazole (PROTONIX) 40 MG tablet Take 1 tablet (40 mg total) by mouth 2 (two) times daily.  ?  polyethylene glycol powder (GLYCOLAX/MIRALAX) 17 GM/SCOOP powder Take 17 g by mouth daily.  ? Pramoxine-Camphor-Zinc Acetate (ANTI ITCH EX) Apply 1 application topically 3 (three) times daily as needed (itching).  ? sertraline (ZOLOFT) 100 MG tablet Take 1 tablet (100 mg total) by mouth daily.  ? simvastatin (ZOCOR) 40 MG tablet Take 40 mg by mouth at bedtime.  ? spironolactone (ALDACTONE) 25 MG tablet Take 0.5 tablets (12.5 mg total) by mouth daily.  ? torsemide (DEMADEX) 10 MG tablet Take 1 tablet (10 mg total) by mouth daily.  ? torsemide (DEMADEX) 20 MG tablet Take 20 mg by mouth daily as needed (leg swelling).  ? triamcinolone  cream (KENALOG) 0.1 % 1 application 2 (two) times daily as needed (itching). perineum  ? Zinc Oxide (DESITIN) 13 % CREA Apply 1 application topically See admin instructions. Cleanse groin area under breast with soap

## 2022-03-18 DIAGNOSIS — Z20822 Contact with and (suspected) exposure to covid-19: Secondary | ICD-10-CM | POA: Diagnosis not present

## 2022-03-22 ENCOUNTER — Encounter (HOSPITAL_COMMUNITY): Payer: Self-pay | Admitting: Emergency Medicine

## 2022-03-22 ENCOUNTER — Emergency Department (HOSPITAL_COMMUNITY)
Admission: EM | Admit: 2022-03-22 | Discharge: 2022-03-23 | Disposition: A | Discharge status: Skilled Nursing Facility | Payer: Medicare HMO | Attending: Emergency Medicine | Admitting: Emergency Medicine

## 2022-03-22 ENCOUNTER — Emergency Department (HOSPITAL_COMMUNITY): Payer: Medicare HMO

## 2022-03-22 DIAGNOSIS — R471 Dysarthria and anarthria: Secondary | ICD-10-CM | POA: Insufficient documentation

## 2022-03-22 DIAGNOSIS — Z79899 Other long term (current) drug therapy: Secondary | ICD-10-CM | POA: Insufficient documentation

## 2022-03-22 DIAGNOSIS — S8012XA Contusion of left lower leg, initial encounter: Secondary | ICD-10-CM | POA: Insufficient documentation

## 2022-03-22 DIAGNOSIS — R531 Weakness: Secondary | ICD-10-CM | POA: Diagnosis not present

## 2022-03-22 DIAGNOSIS — W01198A Fall on same level from slipping, tripping and stumbling with subsequent striking against other object, initial encounter: Secondary | ICD-10-CM | POA: Diagnosis not present

## 2022-03-22 DIAGNOSIS — I10 Essential (primary) hypertension: Secondary | ICD-10-CM | POA: Diagnosis not present

## 2022-03-22 DIAGNOSIS — M25561 Pain in right knee: Secondary | ICD-10-CM | POA: Insufficient documentation

## 2022-03-22 DIAGNOSIS — S8991XA Unspecified injury of right lower leg, initial encounter: Secondary | ICD-10-CM | POA: Diagnosis present

## 2022-03-22 DIAGNOSIS — W19XXXA Unspecified fall, initial encounter: Secondary | ICD-10-CM | POA: Diagnosis not present

## 2022-03-22 DIAGNOSIS — Y92002 Bathroom of unspecified non-institutional (private) residence single-family (private) house as the place of occurrence of the external cause: Secondary | ICD-10-CM | POA: Insufficient documentation

## 2022-03-22 DIAGNOSIS — R059 Cough, unspecified: Secondary | ICD-10-CM | POA: Diagnosis not present

## 2022-03-22 DIAGNOSIS — Z7982 Long term (current) use of aspirin: Secondary | ICD-10-CM | POA: Insufficient documentation

## 2022-03-22 DIAGNOSIS — S8011XA Contusion of right lower leg, initial encounter: Secondary | ICD-10-CM | POA: Diagnosis not present

## 2022-03-22 DIAGNOSIS — Z20822 Contact with and (suspected) exposure to covid-19: Secondary | ICD-10-CM | POA: Diagnosis not present

## 2022-03-22 DIAGNOSIS — R29818 Other symptoms and signs involving the nervous system: Secondary | ICD-10-CM | POA: Diagnosis not present

## 2022-03-22 DIAGNOSIS — R4781 Slurred speech: Secondary | ICD-10-CM | POA: Diagnosis not present

## 2022-03-22 LAB — I-STAT CHEM 8, ED
BUN: 19 mg/dL (ref 8–23)
Calcium, Ion: 1.1 mmol/L — ABNORMAL LOW (ref 1.15–1.40)
Chloride: 101 mmol/L (ref 98–111)
Creatinine, Ser: 1.5 mg/dL — ABNORMAL HIGH (ref 0.44–1.00)
Glucose, Bld: 140 mg/dL — ABNORMAL HIGH (ref 70–99)
HCT: 36 % (ref 36.0–46.0)
Hemoglobin: 12.2 g/dL (ref 12.0–15.0)
Potassium: 4.3 mmol/L (ref 3.5–5.1)
Sodium: 139 mmol/L (ref 135–145)
TCO2: 31 mmol/L (ref 22–32)

## 2022-03-22 LAB — DIFFERENTIAL
Abs Immature Granulocytes: 0.09 10*3/uL — ABNORMAL HIGH (ref 0.00–0.07)
Basophils Absolute: 0.1 10*3/uL (ref 0.0–0.1)
Basophils Relative: 1 %
Eosinophils Absolute: 0.6 10*3/uL — ABNORMAL HIGH (ref 0.0–0.5)
Eosinophils Relative: 6 %
Immature Granulocytes: 1 %
Lymphocytes Relative: 17 %
Lymphs Abs: 1.5 10*3/uL (ref 0.7–4.0)
Monocytes Absolute: 0.6 10*3/uL (ref 0.1–1.0)
Monocytes Relative: 7 %
Neutro Abs: 6.1 10*3/uL (ref 1.7–7.7)
Neutrophils Relative %: 68 %

## 2022-03-22 LAB — CBC
HCT: 38.5 % (ref 36.0–46.0)
Hemoglobin: 12.7 g/dL (ref 12.0–15.0)
MCH: 30.5 pg (ref 26.0–34.0)
MCHC: 33 g/dL (ref 30.0–36.0)
MCV: 92.5 fL (ref 80.0–100.0)
Platelets: 147 10*3/uL — ABNORMAL LOW (ref 150–400)
RBC: 4.16 MIL/uL (ref 3.87–5.11)
RDW: 14 % (ref 11.5–15.5)
WBC: 8.8 10*3/uL (ref 4.0–10.5)
nRBC: 0 % (ref 0.0–0.2)

## 2022-03-22 LAB — CBG MONITORING, ED
Glucose-Capillary: 120 mg/dL — ABNORMAL HIGH (ref 70–99)
Glucose-Capillary: 131 mg/dL — ABNORMAL HIGH (ref 70–99)

## 2022-03-22 LAB — PROTIME-INR
INR: 1 (ref 0.8–1.2)
Prothrombin Time: 13.2 seconds (ref 11.4–15.2)

## 2022-03-22 LAB — APTT: aPTT: 31 seconds (ref 24–36)

## 2022-03-22 NOTE — Code Documentation (Addendum)
Responded to Code Stroke called at 2309 for dysarthria, LSN-2220. Per EMS, pt was seen by facility staff at 2220 with no deficits. Pt called out at 2230 d/t having fallen and hit the back of her head. At that time, staff noticed slurred speech. Pt arrived to ED at 2316, CBG- 120, NIH-2 for LOC questions and dysarthria, CT head negative for acute changes. TNK not given-mild symptoms. Plan for MRI.  ?

## 2022-03-22 NOTE — ED Triage Notes (Signed)
Pt bib [facility] as code stroke, LKW 2220. At 2230, pt rang her call button and was found on the bathroom floor, attempting to get herself from the toilet to the sink. Pt hit her head, not on blood thinners and no LOC. Pt able to speak clearly at baseline, but noted to have slurred speech after being found on the floor. No other neuro deficits noted. A&O x4, able to state name, city, situation, place, year.  ?

## 2022-03-22 NOTE — ED Provider Notes (Signed)
?[facility] ?Provider Note ? ? ?CSN: [id number] ?Arrival date & time: 03/22/22  2316 ? ?An emergency department physician performed an initial assessment on this suspected stroke patient at 2316. ? ?History ? ?No chief complaint on file. ? ? ?[name] is a 88 y.o. female. ? ?The history is provided by the patient and medical records.  ?[name] is a 88 y.o. female who presents to the Emergency Department complaining of code stroke.  She presents to the emergency department by EMS as a code stroke.  Last known well at 2220.  At 2230 staff came to check on her because she called out for having a fall.  She was found on the bathroom floor.  She was attempting to get from the toilet to the sink when she states that she got dizzy and fell, landing on her bottom followed by striking her head.  Staff states that she is speaking differently than previously.  Patient states that her speech has been off and on slurred for several months.  She denies any recent illnesses.  She does complain of some pain to the right knee. ?  ? ?Home Medications ?Prior to Admission medications   ?Medication Sig Start Date End Date Taking? Authorizing Provider  ?acetaminophen (TYLENOL) 325 MG tablet Take 325 mg by mouth every 6 (six) hours as needed for moderate pain.    [provider]  ?acetaminophen (TYLENOL) 500 MG tablet Take 500 mg by mouth daily.    [provider]  ?albuterol (VENTOLIN HFA) 108 (90 Base) MCG/ACT inhaler Inhale 2 puffs into the lungs every 4 (four) hours as needed for wheezing or shortness of breath.    [provider]  ?ALPRAZolam Duanne Moron) 1 MG tablet Take 1 tablet (1 mg total) by mouth 3 (three) times daily as needed for anxiety. ?Patient taking differently: Take 1.5 mg by mouth 3 (three) times daily. 04/11/21   Nita Sells, MD  ?Alum & Mag Hydroxide-Simeth (GERI-LANTA PO) Take 30 mLs by mouth in the morning and at bedtime.     [provider]  ?aspirin 81 MG chewable tablet Chew 81 mg by mouth daily.    [provider]  ?Benzocaine-Menthol (CHLORASEPTIC SORE THROAT MT) Use as directed 2 sprays in the mouth or throat every 4 (four) hours as needed (sore throat).    [provider]  ?benzonatate (TESSALON) 100 MG capsule Take 100 mg by mouth 3 (three) times daily as needed for cough.    [provider]  ?bisacodyl (DULCOLAX) 5 MG EC tablet Take 10 mg by mouth daily as needed for moderate constipation.    [provider]  ?carvedilol (COREG) 6.25 MG tablet Take 6.25 mg by mouth 2 (two) times daily. 09/03/17   [provider]  ?colestipol (COLESTID) 1 g tablet Take 2 tablets (2 g total) by mouth daily. Hold FOR CONSTIPATION 12/11/17   Kayleen Memos, DO  ?docusate sodium (COLACE) 100 MG capsule Take 100 mg by mouth 2 (two) times daily.    [provider]  ?ferrous sulfate 325 (65 FE) MG tablet Take 1 tablet (325 mg total) by mouth daily with breakfast. 04/12/21   Nita Sells, MD  ?guaiFENesin (MUCINEX) 600 MG 12 hr tablet Take 600 mg by mouth 2 (two) times daily as needed for cough (congestion).    [provider]  ?guaifenesin (ROBITUSSIN) 100 MG/5ML syrup Take 100 mg by mouth every 8 (eight) hours as needed for cough.  [provider]  ?HYDROcodone-acetaminophen (NORCO/VICODIN) 5-325 MG tablet Take 1 tablet by mouth every 6 (six) hours as needed for moderate pain.    [provider]  ?hydrOXYzine (ATARAX) 10 MG tablet Take 10 mg by mouth 3 (three) times daily as needed for itching.    [provider]  ?lactobacillus acidophilus & bulgar (LACTINEX) chewable tablet Chew 1 tablet by mouth daily.    [provider]  ?levothyroxine (SYNTHROID, LEVOTHROID) 25 MCG tablet Take 25 mcg by mouth daily before breakfast.    [provider]  ?loperamide (IMODIUM A-D) 2 MG tablet Take 2-4 mg by mouth See admin instructions. 4 mg  after first loose stool, then 2 mg after each loose stool    [provider]  ?losartan (COZAAR) 50 MG tablet Take 0.5 tablets (25 mg total) by mouth daily. 04/11/21   Nita Sells, MD  ?magnesium hydroxide (MILK OF MAGNESIA) 400 MG/5ML suspension Take by mouth daily as needed for mild constipation.    [provider]  ?Menthol, Topical Analgesic, (BIOFREEZE) 4 % GEL Apply 1 application topically 3 (three) times daily as needed (bilateral hip pain).    [provider]  ?Menthol-Zinc Oxide (GOLD BOND EX) Apply 1 application topically 2 (two) times daily as needed (itching).    [provider]  ?mirabegron ER (MYRBETRIQ) 25 MG TB24 tablet Take 25 mg by mouth daily.    [provider]  ?mirtazapine (REMERON) 30 MG tablet Take 1 tablet (30 mg total) by mouth at bedtime. 04/11/21   Nita Sells, MD  ?Multiple Vitamins-Minerals (PRESERVISION AREDS PO) Take 1 capsule by mouth daily.    [provider]  ?NON FORMULARY Take 2.5 mLs by mouth 3 (three) times daily as needed (sore throat). Salt and water solution    [provider]  ?ondansetron (ZOFRAN) 4 MG tablet Take 4 mg by mouth daily as needed for nausea or vomiting.    [provider]  ?pantoprazole (PROTONIX) 40 MG tablet Take 1 tablet (40 mg total) by mouth 2 (two) times daily. 04/11/21   Nita Sells, MD  ?polyethylene glycol powder (GLYCOLAX/MIRALAX) 17 GM/SCOOP powder Take 17 g by mouth daily.    [provider]  ?Pramoxine-Camphor-Zinc Acetate (ANTI ITCH EX) Apply 1 application topically 3 (three) times daily as needed (itching).    [provider]  ?sertraline (ZOLOFT) 100 MG tablet Take 1 tablet (100 mg total) by mouth daily. 04/11/21   Nita Sells, MD  ?simvastatin (ZOCOR) 40 MG tablet Take 40 mg by mouth at bedtime.    [provider]  ?spironolactone (ALDACTONE) 25 MG tablet Take 0.5 tablets (12.5 mg total) by mouth daily. 02/21/17    Josue Hector, MD  ?torsemide (DEMADEX) 10 MG tablet Take 1 tablet (10 mg total) by mouth daily. 04/12/21   Nita Sells, MD  ?torsemide (DEMADEX) 20 MG tablet Take 20 mg by mouth daily as needed (leg swelling).    [provider]  ?triamcinolone cream (KENALOG) 0.1 % 1 application 2 (two) times daily as needed (itching). perineum    [provider]  ?Zinc Oxide (DESITIN) 13 % CREA Apply 1 application topically See admin instructions. Cleanse groin area under breast with soap and water, pat dry and apply ointment on area as needed for redness.    [provider]  ?   ? ?Allergies    ?Fosamax [alendronate sodium], Penicillins, Sulfonamide derivatives, and Tetanus toxoid   ? ?Review of Systems   ?Review of Systems  ?  All other systems reviewed and are negative. ? ?Physical Exam ?Updated Vital Signs ?BP (!) 166/62   Pulse 63   Temp 98.1 ?F (36.7 ?C) (Oral)   Resp 20   Wt 96 kg   SpO2 97%   BMI 28.70 kg/m?  ?Physical Exam ?Vitals and nursing note reviewed.  ?Constitutional:   ?   Appearance: She is well-developed.  ?HENT:  ?   Head: Normocephalic and atraumatic.  ?Cardiovascular:  ?   Rate and Rhythm: Normal rate and regular rhythm.  ?   Heart sounds: No murmur heard. ?Pulmonary:  ?   Effort: Pulmonary effort is normal. No respiratory distress.  ?   Breath sounds: Normal breath sounds.  ?Abdominal:  ?   Palpations: Abdomen is soft.  ?   Tenderness: There is no abdominal tenderness. There is no guarding or rebound.  ?Musculoskeletal:     ?   General: No tenderness.  ?   Comments: Trace edema to bilateral lower extremities.  2+ DP pulses bilaterally.  There are areas of ecchymosis in bilateral lower extremities in various stages of healing.  There is no significant tenderness to palpation over the right knee patient is able to flex and extend the knee.  ?Skin: ?   General: Skin is warm and dry.  ?Neurological:  ?   Mental Status: She is alert and oriented to person, place, and  time.  ?   Comments: Mild dysarthria.  5 out of 5 strength in all 4 extremities.  No asymmetry of facial movements.  ?Psychiatric:     ?   Behavior: Behavior normal.  ? ? ?ED Results / Procedures / Treatments   ?

## 2022-03-23 ENCOUNTER — Emergency Department (HOSPITAL_COMMUNITY): Payer: Medicare HMO

## 2022-03-23 DIAGNOSIS — W19XXXA Unspecified fall, initial encounter: Secondary | ICD-10-CM | POA: Diagnosis not present

## 2022-03-23 DIAGNOSIS — R471 Dysarthria and anarthria: Secondary | ICD-10-CM

## 2022-03-23 DIAGNOSIS — R5383 Other fatigue: Secondary | ICD-10-CM | POA: Diagnosis not present

## 2022-03-23 DIAGNOSIS — Z7401 Bed confinement status: Secondary | ICD-10-CM | POA: Diagnosis not present

## 2022-03-23 DIAGNOSIS — M2550 Pain in unspecified joint: Secondary | ICD-10-CM | POA: Diagnosis not present

## 2022-03-23 DIAGNOSIS — R29818 Other symptoms and signs involving the nervous system: Secondary | ICD-10-CM | POA: Diagnosis not present

## 2022-03-23 LAB — URINALYSIS, ROUTINE W REFLEX MICROSCOPIC
Bacteria, UA: NONE SEEN
Bilirubin Urine: NEGATIVE
Glucose, UA: NEGATIVE mg/dL
Hgb urine dipstick: NEGATIVE
Ketones, ur: NEGATIVE mg/dL
Nitrite: NEGATIVE
Protein, ur: NEGATIVE mg/dL
Specific Gravity, Urine: 1.006 (ref 1.005–1.030)
pH: 7 (ref 5.0–8.0)

## 2022-03-23 LAB — RAPID URINE DRUG SCREEN, HOSP PERFORMED
Amphetamines: NOT DETECTED
Barbiturates: NOT DETECTED
Benzodiazepines: POSITIVE — AB
Cocaine: NOT DETECTED
Opiates: NOT DETECTED
Tetrahydrocannabinol: NOT DETECTED

## 2022-03-23 LAB — COMPREHENSIVE METABOLIC PANEL
ALT: 13 U/L (ref 0–44)
AST: 18 U/L (ref 15–41)
Albumin: 3.5 g/dL (ref 3.5–5.0)
Alkaline Phosphatase: 63 U/L (ref 38–126)
Anion gap: 9 (ref 5–15)
BUN: 19 mg/dL (ref 8–23)
CO2: 29 mmol/L (ref 22–32)
Calcium: 9.3 mg/dL (ref 8.9–10.3)
Chloride: 102 mmol/L (ref 98–111)
Creatinine, Ser: 1.44 mg/dL — ABNORMAL HIGH (ref 0.44–1.00)
GFR, Estimated: 33 mL/min — ABNORMAL LOW (ref >60)
Glucose, Bld: 139 mg/dL — ABNORMAL HIGH (ref 70–99)
Potassium: 4.4 mmol/L (ref 3.5–5.1)
Sodium: 140 mmol/L (ref 135–145)
Total Bilirubin: 0.4 mg/dL (ref 0.3–1.2)
Total Protein: 6.9 g/dL (ref 6.5–8.1)

## 2022-03-23 LAB — RESP PANEL BY RT-PCR (FLU A&B, COVID) ARPGX2
Influenza A by PCR: NEGATIVE
Influenza B by PCR: NEGATIVE
SARS Coronavirus 2 by RT PCR: NEGATIVE

## 2022-03-23 LAB — ETHANOL: Alcohol, Ethyl (B): <10 mg/dL (ref <10)

## 2022-03-23 NOTE — ED Notes (Signed)
Patient transported to X-ray 

## 2022-03-23 NOTE — ED Notes (Signed)
RN attempted to call [name], pt's niece, to inform of her pt's ED encounter and workup for stroke concerns that resulted unremarkable. No answer on phone.  ?

## 2022-03-23 NOTE — ED Notes (Signed)
Pt ambulated self around room with walker without requiring additional assistance ?

## 2022-03-23 NOTE — Consult Note (Addendum)
?                    NEURO HOSPITALIST CONSULT NOTE  ? ?Requestig physician: Dr. Ralene Bathe ? ?Reason for Consult: Slurred speech after a fall striking head ? ?History obtained from:  EMS, Patient and Chart    ? ?HPI:                                                                                                                                         ? [name] is an 88 y.o. female with a PMHx of atrial fibrillation (not on blood thinners), DM, dizziness with gait instability, HLD, HTN, ischemic cardiomyopathy and macular degeneration (blind in left eye), presenting via EMS as a Code Stroke after a fall at her facility. LKN was 2220. The patient had rang her call button at 2230 and was found on her bathroom floor trying to move herself from the toilet to the sink. She hit her head but she states that it did not feel as though the impact was hard and she did not lose consciousness. Staff felt that he speech was slurred and different from her baseline when they found her. EMS was called and a Code Stroke was called in the field. In the ED the patient's speech is slow and mildly dysarthric, but her tongue is also somewhat dry and she also does state that she feels that he speech is currently at baseline.  ? ?Past Medical History:  ?Diagnosis Date  ? Atrial fibrillation ([place])   ? Cholelithiasis   ? Constipation   ? Diabetes mellitus   ? Dizziness   ? with some gait disability  ? GERD (gastroesophageal reflux disease)   ? Hepatic cyst   ? Hyperlipidemia   ? Hypertension   ? Ischemic cardiomyopathy   ? LBBB (left bundle branch block)   ? Macular degeneration   ? Other postprocedural status(V45.89)   ? Peripheral edema   ? ? ?Past Surgical History:  ?Procedure Laterality Date  ? ANKLE SURGERY Bilateral   ? fractures  ? CHOLECYSTECTOMY    ? Dilation and curettage    ? HIP ARTHROPLASTY  05/05/2012  ? Procedure: ARTHROPLASTY BIPOLAR HIP;  Surgeon: Mauri Pole, MD;  Location: [facility];  Service: Orthopedics;   Laterality: Left;  ? TONSILLECTOMY    ? WRIST SURGERY Bilateral   ? wrist fractures  ? ? ?Family History  ?Problem Relation Age of Onset  ? Leukemia Mother   ? Arthritis Father   ? Heart failure Brother   ?     died age 78  ? Arthritis Brother   ?        ? ?Social History:  reports that she has never smoked. She has never used smokeless tobacco. She reports that she does not drink alcohol and does not use drugs. ? ?Allergies  ?Allergen Reactions  ?  Fosamax [Alendronate Sodium] Swelling  ? Penicillins Swelling and Other (See Comments)  ?  Swelling, redness and warmth at injection site ?Has patient had a PCN reaction causing immediate rash, facial/tongue/throat swelling, SOB or lightheadedness with hypotension: No ?Has patient had a PCN reaction causing severe rash involving mucus membranes or skin necrosis: No ?Has patient had a PCN reaction that required hospitalization: No ?Has patient had a PCN reaction occurring within the last 10 years: No ?If all of the above answers are "NO", then may proceed with Cephalosporin use.  ? Sulfonamide Derivatives Swelling  ? Tetanus Toxoid Swelling  ? ? ?HOME MEDICATIONS:                                                                                                                     ? ?No current facility-administered medications on file prior to encounter.  ? ?Current Outpatient Medications on File Prior to Encounter  ?Medication Sig Dispense Refill  ? acetaminophen (TYLENOL) 325 MG tablet Take 325 mg by mouth every 6 (six) hours as needed for moderate pain.    ? acetaminophen (TYLENOL) 500 MG tablet Take 500 mg by mouth daily.    ? albuterol (VENTOLIN HFA) 108 (90 Base) MCG/ACT inhaler Inhale 2 puffs into the lungs every 4 (four) hours as needed for wheezing or shortness of breath.    ? ALPRAZolam (XANAX) 1 MG tablet Take 1 tablet (1 mg total) by mouth 3 (three) times daily as needed for anxiety. (Patient taking differently: Take 1.5 mg by mouth 3 (three) times daily.) 9  tablet 0  ? Alum & Mag Hydroxide-Simeth (GERI-LANTA PO) Take 30 mLs by mouth in the morning and at bedtime.    ? aspirin 81 MG chewable tablet Chew 81 mg by mouth daily.    ? Benzocaine-Menthol (CHLORASEPTIC SORE THROAT MT) Use as directed 2 sprays in the mouth or throat every 4 (four) hours as needed (sore throat).    ? benzonatate (TESSALON) 100 MG capsule Take 100 mg by mouth 3 (three) times daily as needed for cough.    ? bisacodyl (DULCOLAX) 5 MG EC tablet Take 10 mg by mouth daily as needed for moderate constipation.    ? carvedilol (COREG) 6.25 MG tablet Take 6.25 mg by mouth 2 (two) times daily.    ? colestipol (COLESTID) 1 g tablet Take 2 tablets (2 g total) by mouth daily. Hold FOR CONSTIPATION 5 tablet 0  ? docusate sodium (COLACE) 100 MG capsule Take 100 mg by mouth 2 (two) times daily.    ? ferrous sulfate 325 (65 FE) MG tablet Take 1 tablet (325 mg total) by mouth daily with breakfast.  3  ? guaiFENesin (MUCINEX) 600 MG 12 hr tablet Take 600 mg by mouth 2 (two) times daily as needed for cough (congestion).    ? guaifenesin (ROBITUSSIN) 100 MG/5ML syrup Take 100 mg by mouth every 8 (eight) hours as needed for cough.    ? HYDROcodone-acetaminophen (NORCO/VICODIN) 5-325 MG tablet Take 1 tablet by  mouth every 6 (six) hours as needed for moderate pain.    ? hydrOXYzine (ATARAX) 10 MG tablet Take 10 mg by mouth 3 (three) times daily as needed for itching.    ? lactobacillus acidophilus & bulgar (LACTINEX) chewable tablet Chew 1 tablet by mouth daily.    ? levothyroxine (SYNTHROID, LEVOTHROID) 25 MCG tablet Take 25 mcg by mouth daily before breakfast.    ? loperamide (IMODIUM A-D) 2 MG tablet Take 2-4 mg by mouth See admin instructions. 4 mg after first loose stool, then 2 mg after each loose stool    ? losartan (COZAAR) 50 MG tablet Take 0.5 tablets (25 mg total) by mouth daily.    ? magnesium hydroxide (MILK OF MAGNESIA) 400 MG/5ML suspension Take by mouth daily as needed for mild constipation.    ?  Menthol, Topical Analgesic, (BIOFREEZE) 4 % GEL Apply 1 application topically 3 (three) times daily as needed (bilateral hip pain).    ? Menthol-Zinc Oxide (GOLD BOND EX) Apply 1 application topically 2 (two) times daily as needed (itching).    ? mirabegron ER (MYRBETRIQ) 25 MG TB24 tablet Take 25 mg by mouth daily.    ? mirtazapine (REMERON) 30 MG tablet Take 1 tablet (30 mg total) by mouth at bedtime. 6 tablet 0  ? Multiple Vitamins-Minerals (PRESERVISION AREDS PO) Take 1 capsule by mouth daily.    ? NON FORMULARY Take 2.5 mLs by mouth 3 (three) times daily as needed (sore throat). Salt and water solution    ? ondansetron (ZOFRAN) 4 MG tablet Take 4 mg by mouth daily as needed for nausea or vomiting.    ? pantoprazole (PROTONIX) 40 MG tablet Take 1 tablet (40 mg total) by mouth 2 (two) times daily.    ? polyethylene glycol powder (GLYCOLAX/MIRALAX) 17 GM/SCOOP powder Take 17 g by mouth daily.    ? Pramoxine-Camphor-Zinc Acetate (ANTI ITCH EX) Apply 1 application topically 3 (three) times daily as needed (itching).    ? sertraline (ZOLOFT) 100 MG tablet Take 1 tablet (100 mg total) by mouth daily. 6 tablet 0  ? simvastatin (ZOCOR) 40 MG tablet Take 40 mg by mouth at bedtime.    ? spironolactone (ALDACTONE) 25 MG tablet Take 0.5 tablets (12.5 mg total) by mouth daily. 45 tablet 1  ? torsemide (DEMADEX) 10 MG tablet Take 1 tablet (10 mg total) by mouth daily.    ? torsemide (DEMADEX) 20 MG tablet Take 20 mg by mouth daily as needed (leg swelling).    ? triamcinolone cream (KENALOG) 0.1 % 1 application 2 (two) times daily as needed (itching). perineum    ? Zinc Oxide (DESITIN) 13 % CREA Apply 1 application topically See admin instructions. Cleanse groin area under breast with soap and water, pat dry and apply ointment on area as needed for redness.    ? ? ? ?ROS:                                                                                                                                       ?  Complains of some  right leg pain after the fall. Also with rash/scabbing that she attributes to an allergy to a furry plush toy in the form of a cat that she received recently.  ? ? ?Blood pressure (!) 159/85, pulse 72, temperature 97.6 ?F (3

## 2022-03-25 DIAGNOSIS — Z20822 Contact with and (suspected) exposure to covid-19: Secondary | ICD-10-CM | POA: Diagnosis not present

## 2022-04-04 DIAGNOSIS — I480 Paroxysmal atrial fibrillation: Secondary | ICD-10-CM | POA: Diagnosis not present

## 2022-04-04 DIAGNOSIS — E1122 Type 2 diabetes mellitus with diabetic chronic kidney disease: Secondary | ICD-10-CM | POA: Diagnosis not present

## 2022-04-04 DIAGNOSIS — I5022 Chronic systolic (congestive) heart failure: Secondary | ICD-10-CM | POA: Diagnosis not present

## 2022-04-04 DIAGNOSIS — Z0000 Encounter for general adult medical examination without abnormal findings: Secondary | ICD-10-CM | POA: Diagnosis not present

## 2022-04-04 DIAGNOSIS — E039 Hypothyroidism, unspecified: Secondary | ICD-10-CM | POA: Diagnosis not present

## 2022-04-04 DIAGNOSIS — I429 Cardiomyopathy, unspecified: Secondary | ICD-10-CM | POA: Diagnosis not present

## 2022-04-04 DIAGNOSIS — I130 Hypertensive heart and chronic kidney disease with heart failure and stage 1 through stage 4 chronic kidney disease, or unspecified chronic kidney disease: Secondary | ICD-10-CM | POA: Diagnosis not present

## 2022-04-04 DIAGNOSIS — E1142 Type 2 diabetes mellitus with diabetic polyneuropathy: Secondary | ICD-10-CM | POA: Diagnosis not present

## 2022-04-04 DIAGNOSIS — F331 Major depressive disorder, recurrent, moderate: Secondary | ICD-10-CM | POA: Diagnosis not present

## 2022-04-04 DIAGNOSIS — N183 Chronic kidney disease, stage 3 unspecified: Secondary | ICD-10-CM | POA: Diagnosis not present

## 2022-04-12 ENCOUNTER — Non-Acute Institutional Stay: Payer: Medicare HMO | Admitting: Hospice

## 2022-04-12 DIAGNOSIS — I509 Heart failure, unspecified: Secondary | ICD-10-CM

## 2022-04-12 DIAGNOSIS — R296 Repeated falls: Secondary | ICD-10-CM

## 2022-04-12 DIAGNOSIS — Z515 Encounter for palliative care: Secondary | ICD-10-CM

## 2022-04-12 DIAGNOSIS — M159 Polyosteoarthritis, unspecified: Secondary | ICD-10-CM

## 2022-04-12 DIAGNOSIS — R269 Unspecified abnormalities of gait and mobility: Secondary | ICD-10-CM

## 2022-04-12 NOTE — Progress Notes (Signed)
[facility] Consult Note Telephone: [phone]  Fax: [phone]  PATIENT NAME: [name] DOB: Apr 25, 1923 MRN: [id number]  PRIMARY CARE PROVIDER:   Mayra Neer, MD Mayra Neer, MD [address] [place],  [place] [address]  REFERRING PROVIDER: Mayra Neer, MD Mayra Neer, MD [address] [place],  [place] [address]  RESPONSIBLE PARTY:  self/[place] [name] - Niece. POA [address]     Name Relation Home Work [name] Niece [phone]  [phone]   [name] Niece [id number]  [phone]   [name]   [phone]       Visit is to build trust and highlight Palliative Medicine as specialized medical care for people living with serious illness, aimed at facilitating better quality of life through symptoms relief, assisting with advance care planning and complex medical decision making.This is a follow up visit.  RECOMMENDATIONS/PLAN:   Advance Care Planning/Code Status: Patient is a Do Not Resuscitate  Goals of Care: Goals of care include to maximize quality of life and symptom management.  Visit consisted of counseling and education dealing with the complex and emotionally intense issues of symptom management and palliative care in the setting of serious and potentially life-threatening illness. Palliative care team will continue to support patient, patient's family, and medical team.  Symptom management/Plan:  Recurrent Falls: High fall risk. Recommendation - PT/OT for strengthening, gait training and environmental adaptation.  Osteoarthritis: multiple joints - left knee, low back, left shoulder. Managed with Hydrocodone. Encourage activity as tolerated to optimize wellbeing.  CHF: Continue Torsemide, Aldactone. Elevate BLE during the day as much as possible to promote circulation. Adhere to Fluid and salt limits. Monitor  weight closely and report weight gain of 2 Ibs in a day or 5 Ibs in a week.  Routine CBC CMP Hypertension: Coreg, Losartan as ordered. Followed by Cardiologist Anxiety/depression: Continue Xanax. Zoloft, Rameron. Meditation, slow deep breathing encouraged. Follow up: Palliative care will continue to follow for complex medical decision making, advance care planning, and clarification of goals. Return 6 weeks or prn. Encouraged to call provider sooner with any concerns.  CHIEF COMPLAINT: Palliative follow up  HISTORY OF PRESENT ILLNESS:  [name] a 88 y.o. female with multiple morbidities requiring close monitoring/management with high risk of complications and morbidity: CHF, HTN Depression, Anxiety, recurrent falls osteoarthritis, Afib, GERD, left eye blindness, low vision in the right eye.  Patient continues to decline in functional status, admitting that she continues to feel weak and unsteady on her feet.  History obtained from review of EMR, discussion with primary team, family and/or patient. Records reviewed and summarized above. All 10 point systems reviewed and are negative except as documented in history of present illness above  Review and summarization of Epic records shows history from other than patient.   Palliative Care was asked to follow this patient o help address complex decision making in the context of advance care planning and goals of care clarification.  I reviewed patient records, labs, notes, testing and imaging myself as needed and where available.  PHYSICAL EXAM  General: In no acute distress, appropriately dressed, reclining in her chair BLE elevated Cardiovascular: regular rate and rhythm; no edema in BLE Pulmonary: no cough, normal respiratory effort, 95% oxygen saturation on room air.  Abdomen: soft, non tender, no guarding, positive bowel sounds in all quadrants GU:  no suprapubic tenderness Eyes: Normal lids, no discharge ENMT: Moist mucous  membranes Musculoskeletal:  weakness, gait disturbance Skin: no rash to visible skin, warm without cyanosis,  Psych: coping Neurological: Weakness but otherwise non focal Heme/lymph/immuno: no bruises, no bleeding  PERTINENT MEDICATIONS:  Outpatient Encounter Medications as of 04/12/2022  Medication Sig   acetaminophen (TYLENOL) 325 MG tablet Take 325 mg by mouth every 6 (six) hours as needed for moderate pain.   acetaminophen (TYLENOL) 500 MG tablet Take 500 mg by mouth daily.   albuterol (VENTOLIN HFA) 108 (90 Base) MCG/ACT inhaler Inhale 2 puffs into the lungs every 4 (four) hours as needed for wheezing or shortness of breath.   ALPRAZolam (XANAX) 1 MG tablet Take 1 tablet (1 mg total) by mouth 3 (three) times daily as needed for anxiety. (Patient taking differently: Take 1.5 mg by mouth 3 (three) times daily.)   Alum & Mag Hydroxide-Simeth (GERI-LANTA PO) Take 30 mLs by mouth in the morning and at bedtime.   aspirin 81 MG chewable tablet Chew 81 mg by mouth daily.   Benzocaine-Menthol (CHLORASEPTIC SORE THROAT MT) Use as directed 2 sprays in the mouth or throat every 4 (four) hours as needed (sore throat).   benzonatate (TESSALON) 100 MG capsule Take 100 mg by mouth 3 (three) times daily as needed for cough.   bisacodyl (DULCOLAX) 5 MG EC tablet Take 10 mg by mouth daily as needed for moderate constipation.   carvedilol (COREG) 6.25 MG tablet Take 6.25 mg by mouth 2 (two) times daily.   colestipol (COLESTID) 1 g tablet Take 2 tablets (2 g total) by mouth daily. Hold FOR CONSTIPATION   docusate sodium (COLACE) 100 MG capsule Take 100 mg by mouth 2 (two) times daily.   ferrous sulfate 325 (65 FE) MG tablet Take 1 tablet (325 mg total) by mouth daily with breakfast.   guaiFENesin (MUCINEX) 600 MG 12 hr tablet Take 600 mg by mouth 2 (two) times daily as needed for cough (congestion).   guaifenesin (ROBITUSSIN) 100 MG/5ML syrup Take 100 mg by mouth every 8 (eight) hours as needed for cough.    HYDROcodone-acetaminophen (NORCO/VICODIN) 5-325 MG tablet Take 1 tablet by mouth every 6 (six) hours as needed for moderate pain.   hydrOXYzine (ATARAX) 10 MG tablet Take 10 mg by mouth 3 (three) times daily as needed for itching.   lactobacillus acidophilus & bulgar (LACTINEX) chewable tablet Chew 1 tablet by mouth daily.   levothyroxine (SYNTHROID, LEVOTHROID) 25 MCG tablet Take 25 mcg by mouth daily before breakfast.   loperamide (IMODIUM A-D) 2 MG tablet Take 2-4 mg by mouth See admin instructions. 4 mg after first loose stool, then 2 mg after each loose stool   losartan (COZAAR) 50 MG tablet Take 0.5 tablets (25 mg total) by mouth daily.   magnesium hydroxide (MILK OF MAGNESIA) 400 MG/5ML suspension Take by mouth daily as needed for mild constipation.   Menthol, Topical Analgesic, (BIOFREEZE) 4 % GEL Apply 1 application topically 3 (three) times daily as needed (bilateral hip pain).   Menthol-Zinc Oxide (GOLD BOND EX) Apply 1 application topically 2 (two) times daily as needed (itching).   mirabegron ER (MYRBETRIQ) 25 MG TB24 tablet Take 25 mg by mouth daily.   mirtazapine (REMERON) 30 MG tablet Take 1 tablet (30 mg total) by mouth at bedtime.   Multiple Vitamins-Minerals (PRESERVISION AREDS PO) Take 1 capsule by mouth daily.   NON FORMULARY Take 2.5 mLs by mouth 3 (three) times daily as needed (sore throat). Salt and water solution   ondansetron (ZOFRAN) 4 MG tablet  Take 4 mg by mouth daily as needed for nausea or vomiting.   pantoprazole (PROTONIX) 40 MG tablet Take 1 tablet (40 mg total) by mouth 2 (two) times daily.   polyethylene glycol powder (GLYCOLAX/MIRALAX) 17 GM/SCOOP powder Take 17 g by mouth daily.   Pramoxine-Camphor-Zinc Acetate (ANTI ITCH EX) Apply 1 application topically 3 (three) times daily as needed (itching).   sertraline (ZOLOFT) 100 MG tablet Take 1 tablet (100 mg total) by mouth daily.   simvastatin (ZOCOR) 40 MG tablet Take 40 mg by mouth at bedtime.   spironolactone  (ALDACTONE) 25 MG tablet Take 0.5 tablets (12.5 mg total) by mouth daily.   torsemide (DEMADEX) 10 MG tablet Take 1 tablet (10 mg total) by mouth daily.   torsemide (DEMADEX) 20 MG tablet Take 20 mg by mouth daily as needed (leg swelling).   triamcinolone cream (KENALOG) 0.1 % 1 application 2 (two) times daily as needed (itching). perineum   Zinc Oxide (DESITIN) 13 % CREA Apply 1 application topically See admin instructions. Cleanse groin area under breast with soap and water, pat dry and apply ointment on area as needed for redness.   No facility-administered encounter medications on file as of 04/12/2022.    HOSPICE ELIGIBILITY/DIAGNOSIS: TBD  PAST MEDICAL HISTORY:  Past Medical History:  Diagnosis Date   Atrial fibrillation ([place])    Cholelithiasis    Constipation    Diabetes mellitus    Dizziness    with some gait disability   GERD (gastroesophageal reflux disease)    Hepatic cyst    Hyperlipidemia    Hypertension    Ischemic cardiomyopathy    LBBB (left bundle branch block)    Macular degeneration    Other postprocedural status(V45.89)    Peripheral edema       ALLERGIES:  Allergies  Allergen Reactions   Fosamax [Alendronate Sodium] Swelling   Penicillins Swelling and Other (See Comments)    Swelling, redness and warmth at injection site Has patient had a PCN reaction causing immediate rash, facial/tongue/throat swelling, SOB or lightheadedness with hypotension: No Has patient had a PCN reaction causing severe rash involving mucus membranes or skin necrosis: No Has patient had a PCN reaction that required hospitalization: No Has patient had a PCN reaction occurring within the last 10 years: No If all of the above answers are "NO", then may proceed with Cephalosporin use.   Sulfonamide Derivatives Swelling   Tetanus Toxoid Swelling      I spent 45 minutes providing this consultation; this includes time spent with patient/family, chart review and documentation. More  than 50% of the time in this consultation was spent on counseling and coordinating communication   Thank you for the opportunity to participate in the care of BERKLEY ROHLIK Please call our office at [phone] if we can be of additional assistance.  Note: Portions of this note were generated with Lobby[employer]. Dictation errors may occur despite best attempts at proofreading.  Teodoro Spray, NP

## 2022-04-15 ENCOUNTER — Emergency Department (HOSPITAL_COMMUNITY): Payer: Medicare HMO

## 2022-04-15 ENCOUNTER — Encounter (HOSPITAL_COMMUNITY): Payer: Self-pay | Admitting: Emergency Medicine

## 2022-04-15 ENCOUNTER — Emergency Department (HOSPITAL_COMMUNITY)
Admission: EM | Admit: 2022-04-15 | Discharge: 2022-04-15 | Disposition: A | Discharge status: Skilled Nursing Facility | Payer: Medicare HMO | Attending: Emergency Medicine | Admitting: Emergency Medicine

## 2022-04-15 DIAGNOSIS — I10 Essential (primary) hypertension: Secondary | ICD-10-CM | POA: Diagnosis not present

## 2022-04-15 DIAGNOSIS — R531 Weakness: Secondary | ICD-10-CM | POA: Diagnosis not present

## 2022-04-15 DIAGNOSIS — I509 Heart failure, unspecified: Secondary | ICD-10-CM | POA: Diagnosis not present

## 2022-04-15 DIAGNOSIS — Z79899 Other long term (current) drug therapy: Secondary | ICD-10-CM | POA: Diagnosis not present

## 2022-04-15 DIAGNOSIS — I110 Hypertensive heart disease with heart failure: Secondary | ICD-10-CM | POA: Diagnosis not present

## 2022-04-15 DIAGNOSIS — Z7982 Long term (current) use of aspirin: Secondary | ICD-10-CM | POA: Diagnosis not present

## 2022-04-15 DIAGNOSIS — I4510 Unspecified right bundle-branch block: Secondary | ICD-10-CM | POA: Diagnosis not present

## 2022-04-15 DIAGNOSIS — R0602 Shortness of breath: Secondary | ICD-10-CM | POA: Diagnosis not present

## 2022-04-15 DIAGNOSIS — E119 Type 2 diabetes mellitus without complications: Secondary | ICD-10-CM | POA: Insufficient documentation

## 2022-04-15 DIAGNOSIS — Z20822 Contact with and (suspected) exposure to covid-19: Secondary | ICD-10-CM | POA: Insufficient documentation

## 2022-04-15 DIAGNOSIS — I440 Atrioventricular block, first degree: Secondary | ICD-10-CM | POA: Diagnosis not present

## 2022-04-15 DIAGNOSIS — Z743 Need for continuous supervision: Secondary | ICD-10-CM | POA: Diagnosis not present

## 2022-04-15 DIAGNOSIS — R279 Unspecified lack of coordination: Secondary | ICD-10-CM | POA: Diagnosis not present

## 2022-04-15 DIAGNOSIS — I4430 Unspecified atrioventricular block: Secondary | ICD-10-CM | POA: Diagnosis not present

## 2022-04-15 LAB — CBC WITH DIFFERENTIAL/PLATELET
Abs Immature Granulocytes: 0.08 10*3/uL — ABNORMAL HIGH (ref 0.00–0.07)
Basophils Absolute: 0 10*3/uL (ref 0.0–0.1)
Basophils Relative: 0 %
Eosinophils Absolute: 0.4 10*3/uL (ref 0.0–0.5)
Eosinophils Relative: 4 %
HCT: 40.3 % (ref 36.0–46.0)
Hemoglobin: 13.7 g/dL (ref 12.0–15.0)
Immature Granulocytes: 1 %
Lymphocytes Relative: 14 %
Lymphs Abs: 1.3 10*3/uL (ref 0.7–4.0)
MCH: 30.5 pg (ref 26.0–34.0)
MCHC: 34 g/dL (ref 30.0–36.0)
MCV: 89.8 fL (ref 80.0–100.0)
Monocytes Absolute: 0.6 10*3/uL (ref 0.1–1.0)
Monocytes Relative: 6 %
Neutro Abs: 7.4 10*3/uL (ref 1.7–7.7)
Neutrophils Relative %: 75 %
Platelets: 152 10*3/uL (ref 150–400)
RBC: 4.49 MIL/uL (ref 3.87–5.11)
RDW: 13.9 % (ref 11.5–15.5)
WBC: 9.7 10*3/uL (ref 4.0–10.5)
nRBC: 0 % (ref 0.0–0.2)

## 2022-04-15 LAB — I-STAT VENOUS BLOOD GAS, ED
Acid-Base Excess: 5 mmol/L — ABNORMAL HIGH (ref 0.0–2.0)
Bicarbonate: 23.3 mmol/L (ref 20.0–28.0)
Calcium, Ion: 1.05 mmol/L — ABNORMAL LOW (ref 1.15–1.40)
HCT: 39 % (ref 36.0–46.0)
Hemoglobin: 13.3 g/dL (ref 12.0–15.0)
O2 Saturation: 82 %
Potassium: 3.3 mmol/L — ABNORMAL LOW (ref 3.5–5.1)
Sodium: 140 mmol/L (ref 135–145)
TCO2: 24 mmol/L (ref 22–32)
pCO2, Ven: 19.4 mmHg — CL (ref 44–60)
pH, Ven: 7.686 (ref 7.25–7.43)
pO2, Ven: 33 mmHg (ref 32–45)

## 2022-04-15 LAB — COMPREHENSIVE METABOLIC PANEL
ALT: 16 U/L (ref 0–44)
AST: 23 U/L (ref 15–41)
Albumin: 3.7 g/dL (ref 3.5–5.0)
Alkaline Phosphatase: 64 U/L (ref 38–126)
Anion gap: 11 (ref 5–15)
BUN: 16 mg/dL (ref 8–23)
CO2: 24 mmol/L (ref 22–32)
Calcium: 9.3 mg/dL (ref 8.9–10.3)
Chloride: 106 mmol/L (ref 98–111)
Creatinine, Ser: 1.35 mg/dL — ABNORMAL HIGH (ref 0.44–1.00)
GFR, Estimated: 36 mL/min — ABNORMAL LOW (ref >60)
Glucose, Bld: 196 mg/dL — ABNORMAL HIGH (ref 70–99)
Potassium: 3.7 mmol/L (ref 3.5–5.1)
Sodium: 141 mmol/L (ref 135–145)
Total Bilirubin: 0.5 mg/dL (ref 0.3–1.2)
Total Protein: 7.2 g/dL (ref 6.5–8.1)

## 2022-04-15 LAB — BRAIN NATRIURETIC PEPTIDE: B Natriuretic Peptide: 157.6 pg/mL — ABNORMAL HIGH (ref 0.0–100.0)

## 2022-04-15 LAB — RESP PANEL BY RT-PCR (FLU A&B, COVID) ARPGX2
Influenza A by PCR: NEGATIVE
Influenza B by PCR: NEGATIVE
SARS Coronavirus 2 by RT PCR: NEGATIVE

## 2022-04-15 LAB — TROPONIN I (HIGH SENSITIVITY)
Troponin I (High Sensitivity): 11 ng/L (ref <18)
Troponin I (High Sensitivity): 12 ng/L (ref <18)

## 2022-04-15 MED ORDER — ALPRAZOLAM 0.25 MG PO TABS
0.5000 mg | ORAL_TABLET | Freq: Once | ORAL | Status: AC
  Administered 2022-04-15: 0.5 mg via ORAL
  Filled 2022-04-15: qty 2

## 2022-04-15 MED ORDER — ONDANSETRON HCL 4 MG/2ML IJ SOLN
4.0000 mg | Freq: Once | INTRAMUSCULAR | Status: AC
  Administered 2022-04-15: 4 mg via INTRAVENOUS
  Filled 2022-04-15: qty 2

## 2022-04-15 MED ORDER — MIDAZOLAM HCL 2 MG/2ML IJ SOLN
2.0000 mg | Freq: Once | INTRAMUSCULAR | Status: AC
  Administered 2022-04-15: 2 mg via INTRAVENOUS
  Filled 2022-04-15: qty 2

## 2022-04-15 MED ORDER — IPRATROPIUM-ALBUTEROL 0.5-2.5 (3) MG/3ML IN SOLN
3.0000 mL | Freq: Once | RESPIRATORY_TRACT | Status: AC
  Administered 2022-04-15: 3 mL via RESPIRATORY_TRACT
  Filled 2022-04-15: qty 3

## 2022-04-15 MED ORDER — FUROSEMIDE 10 MG/ML IJ SOLN
40.0000 mg | Freq: Once | INTRAMUSCULAR | Status: AC
  Administered 2022-04-15: 40 mg via INTRAVENOUS
  Filled 2022-04-15: qty 4

## 2022-04-15 NOTE — ED Notes (Signed)
Critical labs given to Dr. Nanavati 

## 2022-04-15 NOTE — ED Triage Notes (Signed)
Pt arrives via EMS from [facility] with weakness. Pt states she has had SOB, leg swelling and unable to sleep in 2 nights.

## 2022-04-15 NOTE — ED Provider Notes (Signed)
[facility] EMERGENCY DEPARTMENT Provider Note   CSN: [id number] Arrival date & time: 04/15/22  1020     History  Chief Complaint  Patient presents with   Shortness of Breath    [name] is a 88 y.o. female.  88 year old female with past medical history of A-fib, CHF, diabetes, left bundle branch block, GERD brought in by EMS from [facility] facility for shortness of breath x2 to 3 days with increase in leg swelling, progressive in nature, without chest pain.  Patient denies fever, falls, chest pain.  No other complaints or concerns.      Home Medications Prior to Admission medications   Medication Sig Start Date End Date Taking? Authorizing Provider  acetaminophen (TYLENOL) 325 MG tablet Take 325 mg by mouth every 6 (six) hours as needed for moderate pain.    [provider]  acetaminophen (TYLENOL) 500 MG tablet Take 500 mg by mouth daily.    [provider]  albuterol (VENTOLIN HFA) 108 (90 Base) MCG/ACT inhaler Inhale 2 puffs into the lungs every 4 (four) hours as needed for wheezing or shortness of breath.    [provider]  ALPRAZolam Duanne Moron) 1 MG tablet Take 1 tablet (1 mg total) by mouth 3 (three) times daily as needed for anxiety. Patient taking differently: Take 1.5 mg by mouth 3 (three) times daily. 04/11/21   Nita Sells, MD  Alum & Mag Hydroxide-Simeth (GERI-LANTA PO) Take 30 mLs by mouth in the morning and at bedtime.    [provider]  aspirin 81 MG chewable tablet Chew 81 mg by mouth daily.    [provider]  Benzocaine-Menthol (CHLORASEPTIC SORE THROAT MT) Use as directed 2 sprays in the mouth or throat every 4 (four) hours as needed (sore throat).    [provider]  benzonatate (TESSALON) 100 MG capsule Take 100 mg by mouth 3 (three) times daily as needed for cough.    [provider]  bisacodyl (DULCOLAX) 5 MG EC tablet Take 10 mg by mouth daily as needed for  moderate constipation.    [provider]  carvedilol (COREG) 6.25 MG tablet Take 6.25 mg by mouth 2 (two) times daily. 09/03/17   [provider]  colestipol (COLESTID) 1 g tablet Take 2 tablets (2 g total) by mouth daily. Hold FOR CONSTIPATION 12/11/17   Kayleen Memos, DO  docusate sodium (COLACE) 100 MG capsule Take 100 mg by mouth 2 (two) times daily.    [provider]  ferrous sulfate 325 (65 FE) MG tablet Take 1 tablet (325 mg total) by mouth daily with breakfast. 04/12/21   Nita Sells, MD  guaiFENesin (MUCINEX) 600 MG 12 hr tablet Take 600 mg by mouth 2 (two) times daily as needed for cough (congestion).    [provider]  guaifenesin (ROBITUSSIN) 100 MG/5ML syrup Take 100 mg by mouth every 8 (eight) hours as needed for cough.    [provider]  HYDROcodone-acetaminophen (NORCO/VICODIN) 5-325 MG tablet Take 1 tablet by mouth every 6 (six) hours as needed for moderate pain.    [provider]  hydrOXYzine (ATARAX) 10 MG tablet Take 10 mg by mouth 3 (three) times daily as needed for itching.    [provider]  lactobacillus acidophilus & bulgar (LACTINEX) chewable tablet Chew 1 tablet by mouth daily.    [provider]  levothyroxine (SYNTHROID, LEVOTHROID) 25 MCG tablet Take 25 mcg by mouth daily before breakfast.    [provider]  loperamide (IMODIUM A-D) 2 MG tablet Take 2-4 mg by mouth See admin instructions. 4 mg after first loose stool, then 2 mg after each loose stool    [provider]  losartan (COZAAR) 50 MG tablet Take 0.5 tablets (25 mg total) by mouth daily. 04/11/21   Nita Sells, MD  magnesium hydroxide (MILK OF MAGNESIA) 400 MG/5ML suspension Take by mouth daily as needed for mild constipation.    [provider]  Menthol, Topical Analgesic, (BIOFREEZE) 4 % GEL Apply 1 application topically 3 (three) times daily as needed (bilateral hip pain).    [provider]  Menthol-Zinc Oxide (GOLD BOND EX) Apply 1 application topically 2 (two) times daily as needed (itching).    [provider]  mirabegron ER (MYRBETRIQ) 25 MG TB24 tablet Take 25 mg by mouth daily.    [provider]  mirtazapine (REMERON) 30 MG tablet Take 1 tablet (30 mg total) by mouth at bedtime. 04/11/21   Nita Sells, MD  Multiple Vitamins-Minerals (PRESERVISION AREDS PO) Take 1 capsule by mouth daily.    [provider]  NON FORMULARY Take 2.5 mLs by mouth 3 (three) times daily as needed (sore throat). Salt and water solution    [provider]  ondansetron (ZOFRAN) 4 MG tablet Take 4 mg by mouth daily as needed for nausea or vomiting.    [provider]  pantoprazole (PROTONIX) 40 MG tablet Take 1 tablet (40 mg total) by mouth 2 (two) times daily. 04/11/21   Nita Sells, MD  polyethylene glycol powder (GLYCOLAX/MIRALAX) 17 GM/SCOOP powder Take 17 g by mouth daily.    [provider]  Pramoxine-Camphor-Zinc Acetate (ANTI ITCH EX) Apply 1 application topically 3 (three) times daily as needed (itching).    [provider]  sertraline (ZOLOFT) 100 MG tablet Take 1 tablet (100 mg total) by mouth daily. 04/11/21   Nita Sells, MD  simvastatin (ZOCOR) 40 MG tablet Take 40 mg by mouth at bedtime.    [provider]  spironolactone (ALDACTONE) 25 MG tablet Take 0.5 tablets (12.5 mg total) by mouth daily. 02/21/17   Josue Hector, MD  torsemide (DEMADEX) 10 MG tablet Take 1 tablet (10 mg total) by mouth daily. 04/12/21   Nita Sells, MD  torsemide (DEMADEX) 20 MG tablet Take 20 mg by mouth daily as needed (leg swelling).    [provider]  triamcinolone cream (KENALOG) 0.1 % 1 application 2 (two) times daily as needed (itching). perineum    [provider]  Zinc Oxide (DESITIN) 13 % CREA Apply 1 application topically See admin instructions. Cleanse groin area under  breast with soap and water, pat dry and apply ointment on area as needed for redness.    [provider]      Allergies    Fosamax [alendronate sodium], Penicillins, Sulfonamide derivatives, and Tetanus toxoid    Review of Systems   Review of Systems Negative except as per HPI Physical Exam Updated Vital Signs BP (!) 114/94   Pulse 79   Temp 97.8 F (36.6 C) (Oral)   Resp 19   Ht 6' (1.829 m)   Wt 96 kg   SpO2 98%   BMI 28.70 kg/m  Physical Exam Vitals and nursing note reviewed.  Constitutional:      General: She is in acute distress.     Appearance: She is well-developed. She is not diaphoretic.  HENT:     Head: Normocephalic and atraumatic.  Mouth/Throat:     Mouth: Mucous membranes are moist.  Eyes:     Conjunctiva/sclera: Conjunctivae normal.  Cardiovascular:     Rate and Rhythm: Normal rate and regular rhythm.     Pulses: Normal pulses.     Heart sounds: Normal heart sounds.  Pulmonary:     Effort: Tachypnea present.     Breath sounds: Examination of the right-lower field reveals decreased breath sounds. Examination of the left-lower field reveals decreased breath sounds. Decreased breath sounds and wheezing present.  Abdominal:     Palpations: Abdomen is soft.     Tenderness: There is no abdominal tenderness.  Musculoskeletal:     Cervical back: Neck supple.     Right lower leg: Edema present.     Left lower leg: Edema present.  Skin:    General: Skin is warm and dry.     Findings: Bruising present. No erythema or rash.  Neurological:     Mental Status: She is alert and oriented to person, place, and time.  Psychiatric:        Behavior: Behavior normal.    ED Results / Procedures / Treatments   Labs (all labs ordered are listed, but only abnormal results are displayed) Labs Reviewed  BRAIN NATRIURETIC PEPTIDE - Abnormal; Notable for the following components:      Result Value   B Natriuretic Peptide 157.6 (*)    All other components  within normal limits  CBC WITH DIFFERENTIAL/PLATELET - Abnormal; Notable for the following components:   Abs Immature Granulocytes 0.08 (*)    All other components within normal limits  COMPREHENSIVE METABOLIC PANEL - Abnormal; Notable for the following components:   Glucose, Bld 196 (*)    Creatinine, Ser 1.35 (*)    GFR, Estimated 36 (*)    All other components within normal limits  I-STAT VENOUS BLOOD GAS, ED - Abnormal; Notable for the following components:   pH, Ven 7.686 (*)    pCO2, Ven 19.4 (*)    Acid-Base Excess 5.0 (*)    Potassium 3.3 (*)    Calcium, Ion 1.05 (*)    All other components within normal limits  RESP PANEL BY RT-PCR (FLU A&B, COVID) ARPGX2  BLOOD GAS, VENOUS  TROPONIN I (HIGH SENSITIVITY)  TROPONIN I (HIGH SENSITIVITY)    EKG EKG Interpretation  Date/Time:  Monday Apr 15 2022 10:26:01 EDT Ventricular Rate:  68 PR Interval:  253 QRS Duration: 168 QT Interval:  509 QTC Calculation: 542 R Axis:   -32 Text Interpretation: Sinus rhythm Prolonged PR interval Left bundle branch block No acute changes No significant change since last tracing Confirmed by Varney Biles [id number]) on 04/15/2022 12:13:33 PM  Radiology DG Chest Port 1 View  Result Date: 04/15/2022 CLINICAL DATA:  SHOB EXAM: PORTABLE CHEST 1 VIEW COMPARISON:  May 6, 23 FINDINGS: No consolidation. No visible pleural effusions or pneumothorax. Cardiomediastinal silhouette is within normal limits. No displaced fracture. IMPRESSION: No evidence of acute cardiopulmonary disease. Electronically Signed   By: Margaretha Sheffield M.D.   On: 04/15/2022 11:08    Procedures Procedures    Medications Ordered in ED Medications  furosemide (LASIX) injection 40 mg (40 mg Intravenous Given 04/15/22 1043)  ipratropium-albuterol (DUONEB) 0.5-2.5 (3) MG/3ML nebulizer solution 3 mL (3 mLs Nebulization Given 04/15/22 1043)  ALPRAZolam (XANAX) tablet 0.5 mg (0.5 mg Oral Given 04/15/22 1238)  ondansetron (ZOFRAN)  injection 4 mg (4 mg Intravenous Given 04/15/22 1248)  midazolam (VERSED) injection 2 mg (2 mg Intravenous Given  04/15/22 1350)    ED Course/ Medical Decision Making/ A&P                           Medical Decision Making Amount and/or Complexity of Data Reviewed Labs: ordered. Radiology: ordered.  Risk Prescription drug management.   This patient presents to the ED for concern of shortness of breath with lower extremity edema, this involves an extensive number of treatment options, and is a complaint that carries with it a high risk of complications and morbidity.  The differential diagnosis includes but not limited to CHF, COPD, MI, PE   Co morbidities that complicate the patient evaluation  A-fib, left bundle branch block, CHF, GERD, diabetes, hyperlipidemia, hypertension, ischemic cardiomyopathy   Additional history obtained:  Additional history obtained from EMS who notes that they were called to the facility with complaint of leg numbness.  Patient states that this is baseline for her and unchanged, she is here for her shortness of breath and leg swelling. Call to facility who verifies patient is sent for difficulty breathing which they noticed this morning. External records from outside source obtained and reviewed including med rec from nursing facility, patient is prescribed torsemide. Last echo on file from 04/14/2021 history of diastolic heart failure with EF of 30 to 35%   Lab Tests:  I Ordered, and personally interpreted labs.  The pertinent results include: CBC unremarkable, CMP with elevated glucose at 196, creatinine mildly elevated 1.35, no ischemic change compared to prior.  COVID and flu negative.  Troponin is 12.  BNP, elevated at 157.6.   Imaging Studies ordered:  I ordered imaging studies including chest x-ray I independently visualized and interpreted imaging which showed no acute findings I agree with the radiologist interpretation   Cardiac Monitoring:  / EKG:  The patient was maintained on a cardiac monitor.  I personally viewed and interpreted the cardiac monitored which showed an underlying rhythm of: Sinus rhythm rate 68   Consultations Obtained:  I requested consultation with the ER attending Dr. Kathrynn Humble ,  and discussed lab and imaging findings as well as pertinent plan - they recommend: Versed for patient's anxiety.  On recheck, her symptoms have completely resolved, she is feeling well and ready for discharge.   Problem List / ED Course / Critical interventions / Medication management  88 year old female brought in by EMS from facility with concern for progressively worsening shortness of breath and lower extremity edema.  She arrives tachypneic with audible wheezing, diminished lower lung fields.  She does have mild pitting edema to bilateral lower extremities with old bruising noted to lower extremities.  She has some abdominal discomfort on exam but notes that she needs to void and move her bowels.  Dr. Kathrynn Humble was called to room due to concern for patient's increased respiratory effort and presentation.  Agrees with plan for DuoNeb, Lasix, CHF work-up. Patient remained fairly anxious despite improvement in her lung sounds.  Discussed with the ER attending who recommends for said.  On recheck, patient is resting comfortably, she feels significantly improved and was requesting discharge. I ordered medication including Lasix, DuoNeb, Xanax, Versed for wheezing, shortness of breath, anxiety Reevaluation of the patient after these medicines showed that the patient resolved I have reviewed the patients home medicines and have made adjustments as needed   Social Determinants of Health:  Lives at nursing facility who helps with her care   Test / Admission - Considered:  Consider admission  for CHF however patient's symptoms have completely resolved, she is feeling much improved, respirations even and unlabored.  She would like to be  discharged at this time which seems reasonable.         Final Clinical Impression(s) / ED Diagnoses Final diagnoses:  SOB (shortness of breath)  Acute on chronic congestive heart failure, unspecified heart failure type [facility])    Rx / DC Orders ED Discharge Orders     None         Tacy Learn, PA-C 04/15/22 1513    Varney Biles, MD 04/18/22 1203

## 2022-04-15 NOTE — ED Notes (Signed)
Help get patient on the monitor did ekg shown to Dr Rhunette Croft patient is resting with call bell in reach

## 2022-04-16 ENCOUNTER — Inpatient Hospital Stay (HOSPITAL_COMMUNITY)
Admission: EM | Admit: 2022-04-16 | Discharge: 2022-04-25 | DRG: 884 | Disposition: A | Discharge status: Skilled Nursing Facility | Payer: Medicare HMO | Source: Skilled Nursing Facility | Attending: Family Medicine | Admitting: Family Medicine

## 2022-04-16 ENCOUNTER — Emergency Department (HOSPITAL_COMMUNITY): Payer: Medicare HMO

## 2022-04-16 ENCOUNTER — Other Ambulatory Visit: Payer: Self-pay

## 2022-04-16 DIAGNOSIS — I5042 Chronic combined systolic (congestive) and diastolic (congestive) heart failure: Secondary | ICD-10-CM | POA: Diagnosis present

## 2022-04-16 DIAGNOSIS — T474X5A Adverse effect of other laxatives, initial encounter: Secondary | ICD-10-CM | POA: Diagnosis present

## 2022-04-16 DIAGNOSIS — E872 Acidosis, unspecified: Secondary | ICD-10-CM | POA: Diagnosis not present

## 2022-04-16 DIAGNOSIS — N1831 Chronic kidney disease, stage 3a: Secondary | ICD-10-CM | POA: Diagnosis not present

## 2022-04-16 DIAGNOSIS — Z66 Do not resuscitate: Secondary | ICD-10-CM | POA: Diagnosis present

## 2022-04-16 DIAGNOSIS — N179 Acute kidney failure, unspecified: Secondary | ICD-10-CM | POA: Diagnosis not present

## 2022-04-16 DIAGNOSIS — Z20822 Contact with and (suspected) exposure to covid-19: Secondary | ICD-10-CM | POA: Diagnosis present

## 2022-04-16 DIAGNOSIS — R5381 Other malaise: Secondary | ICD-10-CM | POA: Diagnosis present

## 2022-04-16 DIAGNOSIS — Z7401 Bed confinement status: Secondary | ICD-10-CM | POA: Diagnosis not present

## 2022-04-16 DIAGNOSIS — M4312 Spondylolisthesis, cervical region: Secondary | ICD-10-CM | POA: Diagnosis not present

## 2022-04-16 DIAGNOSIS — R109 Unspecified abdominal pain: Secondary | ICD-10-CM | POA: Diagnosis not present

## 2022-04-16 DIAGNOSIS — E1122 Type 2 diabetes mellitus with diabetic chronic kidney disease: Secondary | ICD-10-CM | POA: Diagnosis present

## 2022-04-16 DIAGNOSIS — L89312 Pressure ulcer of right buttock, stage 2: Secondary | ICD-10-CM | POA: Diagnosis not present

## 2022-04-16 DIAGNOSIS — M7989 Other specified soft tissue disorders: Secondary | ICD-10-CM | POA: Diagnosis not present

## 2022-04-16 DIAGNOSIS — S3991XA Unspecified injury of abdomen, initial encounter: Secondary | ICD-10-CM | POA: Diagnosis not present

## 2022-04-16 DIAGNOSIS — E875 Hyperkalemia: Secondary | ICD-10-CM | POA: Diagnosis present

## 2022-04-16 DIAGNOSIS — I4581 Long QT syndrome: Secondary | ICD-10-CM | POA: Diagnosis not present

## 2022-04-16 DIAGNOSIS — Z79899 Other long term (current) drug therapy: Secondary | ICD-10-CM | POA: Diagnosis not present

## 2022-04-16 DIAGNOSIS — E039 Hypothyroidism, unspecified: Secondary | ICD-10-CM | POA: Diagnosis present

## 2022-04-16 DIAGNOSIS — R9431 Abnormal electrocardiogram [ECG] [EKG]: Secondary | ICD-10-CM | POA: Diagnosis present

## 2022-04-16 DIAGNOSIS — I480 Paroxysmal atrial fibrillation: Secondary | ICD-10-CM | POA: Diagnosis not present

## 2022-04-16 DIAGNOSIS — F05 Delirium due to known physiological condition: Secondary | ICD-10-CM | POA: Diagnosis present

## 2022-04-16 DIAGNOSIS — L8990 Pressure ulcer of unspecified site, unspecified stage: Secondary | ICD-10-CM | POA: Insufficient documentation

## 2022-04-16 DIAGNOSIS — Z9181 History of falling: Secondary | ICD-10-CM

## 2022-04-16 DIAGNOSIS — I10 Essential (primary) hypertension: Secondary | ICD-10-CM | POA: Diagnosis not present

## 2022-04-16 DIAGNOSIS — W19XXXA Unspecified fall, initial encounter: Secondary | ICD-10-CM | POA: Diagnosis not present

## 2022-04-16 DIAGNOSIS — E1140 Type 2 diabetes mellitus with diabetic neuropathy, unspecified: Secondary | ICD-10-CM | POA: Diagnosis present

## 2022-04-16 DIAGNOSIS — F0392 Unspecified dementia, unspecified severity, with psychotic disturbance: Secondary | ICD-10-CM | POA: Diagnosis not present

## 2022-04-16 DIAGNOSIS — I4891 Unspecified atrial fibrillation: Secondary | ICD-10-CM | POA: Diagnosis not present

## 2022-04-16 DIAGNOSIS — I130 Hypertensive heart and chronic kidney disease with heart failure and stage 1 through stage 4 chronic kidney disease, or unspecified chronic kidney disease: Secondary | ICD-10-CM | POA: Diagnosis present

## 2022-04-16 DIAGNOSIS — R197 Diarrhea, unspecified: Secondary | ICD-10-CM | POA: Diagnosis present

## 2022-04-16 DIAGNOSIS — N170 Acute kidney failure with tubular necrosis: Secondary | ICD-10-CM | POA: Diagnosis not present

## 2022-04-16 DIAGNOSIS — K219 Gastro-esophageal reflux disease without esophagitis: Secondary | ICD-10-CM | POA: Diagnosis present

## 2022-04-16 DIAGNOSIS — Z043 Encounter for examination and observation following other accident: Secondary | ICD-10-CM | POA: Diagnosis not present

## 2022-04-16 DIAGNOSIS — E118 Type 2 diabetes mellitus with unspecified complications: Secondary | ICD-10-CM | POA: Diagnosis not present

## 2022-04-16 DIAGNOSIS — R8271 Bacteriuria: Secondary | ICD-10-CM | POA: Diagnosis present

## 2022-04-16 DIAGNOSIS — I5040 Unspecified combined systolic (congestive) and diastolic (congestive) heart failure: Secondary | ICD-10-CM | POA: Diagnosis not present

## 2022-04-16 DIAGNOSIS — M47812 Spondylosis without myelopathy or radiculopathy, cervical region: Secondary | ICD-10-CM | POA: Diagnosis not present

## 2022-04-16 DIAGNOSIS — G9341 Metabolic encephalopathy: Secondary | ICD-10-CM | POA: Diagnosis not present

## 2022-04-16 DIAGNOSIS — R278 Other lack of coordination: Secondary | ICD-10-CM | POA: Diagnosis not present

## 2022-04-16 DIAGNOSIS — M19031 Primary osteoarthritis, right wrist: Secondary | ICD-10-CM | POA: Diagnosis not present

## 2022-04-16 DIAGNOSIS — R531 Weakness: Secondary | ICD-10-CM | POA: Diagnosis not present

## 2022-04-16 DIAGNOSIS — G319 Degenerative disease of nervous system, unspecified: Secondary | ICD-10-CM | POA: Diagnosis not present

## 2022-04-16 DIAGNOSIS — M1712 Unilateral primary osteoarthritis, left knee: Secondary | ICD-10-CM | POA: Diagnosis not present

## 2022-04-16 DIAGNOSIS — N1832 Chronic kidney disease, stage 3b: Secondary | ICD-10-CM | POA: Diagnosis not present

## 2022-04-16 LAB — CBC
HCT: 40.6 % (ref 36.0–46.0)
Hemoglobin: 12.9 g/dL (ref 12.0–15.0)
MCH: 29.7 pg (ref 26.0–34.0)
MCHC: 31.8 g/dL (ref 30.0–36.0)
MCV: 93.5 fL (ref 80.0–100.0)
Platelets: 162 10*3/uL (ref 150–400)
RBC: 4.34 MIL/uL (ref 3.87–5.11)
RDW: 14.7 % (ref 11.5–15.5)
WBC: 15.3 10*3/uL — ABNORMAL HIGH (ref 4.0–10.5)
nRBC: 0 % (ref 0.0–0.2)

## 2022-04-16 LAB — I-STAT CHEM 8, ED
BUN: 47 mg/dL — ABNORMAL HIGH (ref 8–23)
Calcium, Ion: 1.03 mmol/L — ABNORMAL LOW (ref 1.15–1.40)
Chloride: 101 mmol/L (ref 98–111)
Creatinine, Ser: 2.8 mg/dL — ABNORMAL HIGH (ref 0.44–1.00)
Glucose, Bld: 135 mg/dL — ABNORMAL HIGH (ref 70–99)
HCT: 39 % (ref 36.0–46.0)
Hemoglobin: 13.3 g/dL (ref 12.0–15.0)
Potassium: 7.1 mmol/L (ref 3.5–5.1)
Sodium: 134 mmol/L — ABNORMAL LOW (ref 135–145)
TCO2: 28 mmol/L (ref 22–32)

## 2022-04-16 LAB — ETHANOL: Alcohol, Ethyl (B): <10 mg/dL (ref <10)

## 2022-04-16 LAB — RESP PANEL BY RT-PCR (FLU A&B, COVID) ARPGX2
Influenza A by PCR: NEGATIVE
Influenza B by PCR: NEGATIVE
SARS Coronavirus 2 by RT PCR: NEGATIVE

## 2022-04-16 LAB — PROTIME-INR
INR: 1.2 (ref 0.8–1.2)
Prothrombin Time: 15.5 seconds — ABNORMAL HIGH (ref 11.4–15.2)

## 2022-04-16 LAB — LACTIC ACID, PLASMA: Lactic Acid, Venous: 3 mmol/L (ref 0.5–1.9)

## 2022-04-16 MED ORDER — SODIUM ZIRCONIUM CYCLOSILICATE 10 G PO PACK
10.0000 g | PACK | Freq: Once | ORAL | Status: AC
  Administered 2022-04-17: 10 g via ORAL
  Filled 2022-04-16: qty 1

## 2022-04-16 MED ORDER — DEXTROSE 50 % IV SOLN
1.0000 | Freq: Once | INTRAVENOUS | Status: AC
Start: 2022-04-16 — End: 2022-04-16
  Administered 2022-04-16: 50 mL via INTRAVENOUS
  Filled 2022-04-16: qty 50

## 2022-04-16 MED ORDER — CALCIUM GLUCONATE-NACL 1-0.675 GM/50ML-% IV SOLN
1.0000 g | Freq: Once | INTRAVENOUS | Status: AC
  Administered 2022-04-16: 1000 mg via INTRAVENOUS
  Filled 2022-04-16: qty 50

## 2022-04-16 MED ORDER — LACTATED RINGERS IV BOLUS
1000.0000 mL | Freq: Once | INTRAVENOUS | Status: AC
  Administered 2022-04-16: 1000 mL via INTRAVENOUS

## 2022-04-16 MED ORDER — INSULIN ASPART 100 UNIT/ML IV SOLN
5.0000 [IU] | Freq: Once | INTRAVENOUS | Status: AC
  Administered 2022-04-16: 5 [IU] via INTRAVENOUS

## 2022-04-16 NOTE — ED Triage Notes (Signed)
Pt BIB EMS [facility]. Pt had unwitnessed fall getting into wheelchair. Unknown amount of time down but estimated around 15 min since family had just left. Pt found supine on the floor. C spine clear by EMS, tried to sit her up but pt could not bear weight on her own. Only injuries noted are skin tear to right wrist and elbow. Pt reporting no pain currently. EMS noted pt states she took a laxative 2 days ago d/t constipation and has had multiple episodes of diarrhea in their presence.  VS Stable with EMS 126/63 P 82 O2 94 CBG 204 Pt is alert to baseline - hx Alzheimers

## 2022-04-16 NOTE — ED Provider Notes (Signed)
[facility] EMERGENCY DEPARTMENT Provider Note   CSN: [id number] Arrival date & time: 04/16/22  2104     History {Add pertinent medical, surgical, social history, OB history to HPI:1} No chief complaint on file.   [name] is a 88 y.o. female.  HPI     Home Medications Prior to Admission medications   Medication Sig Start Date End Date Taking? Authorizing Provider  acetaminophen (TYLENOL) 325 MG tablet Take 325 mg by mouth every 6 (six) hours as needed for moderate pain.    [provider]  acetaminophen (TYLENOL) 500 MG tablet Take 500 mg by mouth daily.    [provider]  albuterol (VENTOLIN HFA) 108 (90 Base) MCG/ACT inhaler Inhale 2 puffs into the lungs every 4 (four) hours as needed for wheezing or shortness of breath.    [provider]  ALPRAZolam Duanne Moron) 1 MG tablet Take 1 tablet (1 mg total) by mouth 3 (three) times daily as needed for anxiety. Patient taking differently: Take 1.5 mg by mouth 3 (three) times daily. 04/11/21   Nita Sells, MD  Alum & Mag Hydroxide-Simeth (GERI-LANTA PO) Take 30 mLs by mouth in the morning and at bedtime.    [provider]  aspirin 81 MG chewable tablet Chew 81 mg by mouth daily.    [provider]  Benzocaine-Menthol (CHLORASEPTIC SORE THROAT MT) Use as directed 2 sprays in the mouth or throat every 4 (four) hours as needed (sore throat).    [provider]  benzonatate (TESSALON) 100 MG capsule Take 100 mg by mouth 3 (three) times daily as needed for cough.    [provider]  bisacodyl (DULCOLAX) 5 MG EC tablet Take 10 mg by mouth daily as needed for moderate constipation.    [provider]  carvedilol (COREG) 6.25 MG tablet Take 6.25 mg by mouth 2 (two) times daily. 09/03/17   [provider]  colestipol (COLESTID) 1 g tablet Take 2 tablets (2 g total) by mouth daily. Hold FOR CONSTIPATION 12/11/17   Kayleen Memos, DO   docusate sodium (COLACE) 100 MG capsule Take 100 mg by mouth 2 (two) times daily.    [provider]  ferrous sulfate 325 (65 FE) MG tablet Take 1 tablet (325 mg total) by mouth daily with breakfast. 04/12/21   Nita Sells, MD  guaiFENesin (MUCINEX) 600 MG 12 hr tablet Take 600 mg by mouth 2 (two) times daily as needed for cough (congestion).    [provider]  guaifenesin (ROBITUSSIN) 100 MG/5ML syrup Take 100 mg by mouth every 8 (eight) hours as needed for cough.    [provider]  HYDROcodone-acetaminophen (NORCO/VICODIN) 5-325 MG tablet Take 1 tablet by mouth every 6 (six) hours as needed for moderate pain.    [provider]  hydrOXYzine (ATARAX) 10 MG tablet Take 10 mg by mouth 3 (three) times daily as needed for itching.    [provider]  lactobacillus acidophilus & bulgar (LACTINEX) chewable tablet Chew 1 tablet by mouth daily.    [provider]  levothyroxine (SYNTHROID, LEVOTHROID) 25 MCG tablet Take 25 mcg by mouth daily before breakfast.    [provider]  loperamide (IMODIUM A-D) 2 MG tablet Take 2-4 mg by mouth See admin instructions. 4 mg after first loose stool, then 2 mg after each loose stool    [provider]  losartan (COZAAR) 50 MG tablet Take 0.5 tablets (25 mg total) by mouth daily. 04/11/21  Nita Sells, MD  magnesium hydroxide (MILK OF MAGNESIA) 400 MG/5ML suspension Take by mouth daily as needed for mild constipation.    [provider]  Menthol, Topical Analgesic, (BIOFREEZE) 4 % GEL Apply 1 application topically 3 (three) times daily as needed (bilateral hip pain).    [provider]  Menthol-Zinc Oxide (GOLD BOND EX) Apply 1 application topically 2 (two) times daily as needed (itching).    [provider]  mirabegron ER (MYRBETRIQ) 25 MG TB24 tablet Take 25 mg by mouth daily.    [provider]  mirtazapine (REMERON) 30 MG tablet Take 1  tablet (30 mg total) by mouth at bedtime. 04/11/21   Nita Sells, MD  Multiple Vitamins-Minerals (PRESERVISION AREDS PO) Take 1 capsule by mouth daily.    [provider]  NON FORMULARY Take 2.5 mLs by mouth 3 (three) times daily as needed (sore throat). Salt and water solution    [provider]  ondansetron (ZOFRAN) 4 MG tablet Take 4 mg by mouth daily as needed for nausea or vomiting.    [provider]  pantoprazole (PROTONIX) 40 MG tablet Take 1 tablet (40 mg total) by mouth 2 (two) times daily. 04/11/21   Nita Sells, MD  polyethylene glycol powder (GLYCOLAX/MIRALAX) 17 GM/SCOOP powder Take 17 g by mouth daily.    [provider]  Pramoxine-Camphor-Zinc Acetate (ANTI ITCH EX) Apply 1 application topically 3 (three) times daily as needed (itching).    [provider]  sertraline (ZOLOFT) 100 MG tablet Take 1 tablet (100 mg total) by mouth daily. 04/11/21   Nita Sells, MD  simvastatin (ZOCOR) 40 MG tablet Take 40 mg by mouth at bedtime.    [provider]  spironolactone (ALDACTONE) 25 MG tablet Take 0.5 tablets (12.5 mg total) by mouth daily. 02/21/17   Josue Hector, MD  torsemide (DEMADEX) 10 MG tablet Take 1 tablet (10 mg total) by mouth daily. 04/12/21   Nita Sells, MD  torsemide (DEMADEX) 20 MG tablet Take 20 mg by mouth daily as needed (leg swelling).    [provider]  triamcinolone cream (KENALOG) 0.1 % 1 application 2 (two) times daily as needed (itching). perineum    [provider]  Zinc Oxide (DESITIN) 13 % CREA Apply 1 application topically See admin instructions. Cleanse groin area under breast with soap and water, pat dry and apply ointment on area as needed for redness.    [provider]      Allergies    Fosamax [alendronate sodium], Penicillins, Sulfonamide derivatives, and Tetanus toxoid    Review of Systems   Review of Systems  Physical Exam Updated  Vital Signs There were no vitals taken for this visit. Physical Exam  ED Results / Procedures / Treatments   Labs (all labs ordered are listed, but only abnormal results are displayed) Labs Reviewed - No data to display  EKG None  Radiology DG Chest [facility] 1 View  Result Date: 04/15/2022 CLINICAL DATA:  SHOB EXAM: PORTABLE CHEST 1 VIEW COMPARISON:  May 6, 23 FINDINGS: No consolidation. No visible pleural effusions or pneumothorax. Cardiomediastinal silhouette is within normal limits. No displaced fracture. IMPRESSION: No evidence of acute cardiopulmonary disease. Electronically Signed   By: Margaretha Sheffield M.D.   On: 04/15/2022 11:08    Procedures .Critical Care Performed by: Regan Lemming, MD Authorized by: Regan Lemming, MD   Critical care provider statement:    Critical care time (minutes):  30   Critical care was necessary  to treat or prevent imminent or life-threatening deterioration of the following conditions:  Metabolic crisis   Critical care was time spent personally by me on the following activities:  Development of treatment plan with patient or surrogate, discussions with consultants, evaluation of patient's response to treatment, examination of patient, ordering and review of laboratory studies, ordering and review of radiographic studies, ordering and performing treatments and interventions, pulse oximetry, re-evaluation of patient's condition and review of old charts  {Document cardiac monitor, telemetry assessment procedure when appropriate:1}  Medications Ordered in ED Medications - No data to display  ED Course/ Medical Decision Making/ A&P Clinical Course as of 04/16/22 2258  Tue Apr 16, 2022  2248 Potassium(!!): 7.1 [JL]    Clinical Course User Index [JL] Regan Lemming, MD                           Medical Decision Making Amount and/or Complexity of Data Reviewed Labs: ordered. Decision-making details documented in ED Course. Radiology:  ordered. ECG/medicine tests: ordered.  Risk OTC drugs. Prescription drug management.   ***  {Document critical care time when appropriate:1} {Document review of labs and clinical decision tools ie heart score, Chads2Vasc2 etc:1}  {Document your independent review of radiology images, and any outside records:1} {Document your discussion with family members, caretakers, and with consultants:1} {Document social determinants of health affecting pt's care:1} {Document your decision making why or why not admission, treatments were needed:1} Final Clinical Impression(s) / ED Diagnoses Final diagnoses:  None    Rx / DC Orders ED Discharge Orders     None

## 2022-04-17 ENCOUNTER — Emergency Department (HOSPITAL_COMMUNITY): Payer: Medicare HMO

## 2022-04-17 DIAGNOSIS — I4891 Unspecified atrial fibrillation: Secondary | ICD-10-CM

## 2022-04-17 DIAGNOSIS — E875 Hyperkalemia: Secondary | ICD-10-CM | POA: Diagnosis not present

## 2022-04-17 DIAGNOSIS — N1831 Chronic kidney disease, stage 3a: Secondary | ICD-10-CM | POA: Diagnosis not present

## 2022-04-17 DIAGNOSIS — Z66 Do not resuscitate: Secondary | ICD-10-CM

## 2022-04-17 DIAGNOSIS — I5042 Chronic combined systolic (congestive) and diastolic (congestive) heart failure: Secondary | ICD-10-CM | POA: Diagnosis not present

## 2022-04-17 DIAGNOSIS — I10 Essential (primary) hypertension: Secondary | ICD-10-CM

## 2022-04-17 DIAGNOSIS — N179 Acute kidney failure, unspecified: Secondary | ICD-10-CM | POA: Diagnosis present

## 2022-04-17 DIAGNOSIS — N170 Acute kidney failure with tubular necrosis: Secondary | ICD-10-CM

## 2022-04-17 DIAGNOSIS — I480 Paroxysmal atrial fibrillation: Secondary | ICD-10-CM

## 2022-04-17 LAB — BASIC METABOLIC PANEL
Anion gap: 11 (ref 5–15)
BUN: 29 mg/dL — ABNORMAL HIGH (ref 8–23)
CO2: 26 mmol/L (ref 22–32)
Calcium: 9.2 mg/dL (ref 8.9–10.3)
Chloride: 101 mmol/L (ref 98–111)
Creatinine, Ser: 2.45 mg/dL — ABNORMAL HIGH (ref 0.44–1.00)
GFR, Estimated: 17 mL/min — ABNORMAL LOW (ref >60)
Glucose, Bld: 122 mg/dL — ABNORMAL HIGH (ref 70–99)
Potassium: 3.8 mmol/L (ref 3.5–5.1)
Sodium: 138 mmol/L (ref 135–145)

## 2022-04-17 LAB — URINALYSIS, ROUTINE W REFLEX MICROSCOPIC
Bilirubin Urine: NEGATIVE
Glucose, UA: NEGATIVE mg/dL
Ketones, ur: NEGATIVE mg/dL
Nitrite: NEGATIVE
Protein, ur: NEGATIVE mg/dL
Specific Gravity, Urine: 1.016 (ref 1.005–1.030)
WBC, UA: >50 WBC/hpf — ABNORMAL HIGH (ref 0–5)
pH: 6 (ref 5.0–8.0)

## 2022-04-17 LAB — CBC WITH DIFFERENTIAL/PLATELET
Abs Immature Granulocytes: 0.13 10*3/uL — ABNORMAL HIGH (ref 0.00–0.07)
Basophils Absolute: 0 10*3/uL (ref 0.0–0.1)
Basophils Relative: 0 %
Eosinophils Absolute: 0.1 10*3/uL (ref 0.0–0.5)
Eosinophils Relative: 1 %
HCT: 37 % (ref 36.0–46.0)
Hemoglobin: 12.1 g/dL (ref 12.0–15.0)
Immature Granulocytes: 1 %
Lymphocytes Relative: 13 %
Lymphs Abs: 1.7 10*3/uL (ref 0.7–4.0)
MCH: 30.3 pg (ref 26.0–34.0)
MCHC: 32.7 g/dL (ref 30.0–36.0)
MCV: 92.5 fL (ref 80.0–100.0)
Monocytes Absolute: 1 10*3/uL (ref 0.1–1.0)
Monocytes Relative: 8 %
Neutro Abs: 10.3 10*3/uL — ABNORMAL HIGH (ref 1.7–7.7)
Neutrophils Relative %: 77 %
Platelets: 148 10*3/uL — ABNORMAL LOW (ref 150–400)
RBC: 4 MIL/uL (ref 3.87–5.11)
RDW: 14.4 % (ref 11.5–15.5)
WBC: 13.3 10*3/uL — ABNORMAL HIGH (ref 4.0–10.5)
nRBC: 0 % (ref 0.0–0.2)

## 2022-04-17 LAB — COMPREHENSIVE METABOLIC PANEL
ALT: 23 U/L (ref 0–44)
ALT: 28 U/L (ref 0–44)
AST: 103 U/L — ABNORMAL HIGH (ref 15–41)
AST: 69 U/L — ABNORMAL HIGH (ref 15–41)
Albumin: 3.6 g/dL (ref 3.5–5.0)
Albumin: 3.3 g/dL — ABNORMAL LOW (ref 3.5–5.0)
Alkaline Phosphatase: 58 U/L (ref 38–126)
Alkaline Phosphatase: 58 U/L (ref 38–126)
Anion gap: 14 (ref 5–15)
Anion gap: 10 (ref 5–15)
BUN: 30 mg/dL — ABNORMAL HIGH (ref 8–23)
BUN: 29 mg/dL — ABNORMAL HIGH (ref 8–23)
CO2: 25 mmol/L (ref 22–32)
CO2: 26 mmol/L (ref 22–32)
Calcium: 9.2 mg/dL (ref 8.9–10.3)
Calcium: 8.9 mg/dL (ref 8.9–10.3)
Chloride: 99 mmol/L (ref 98–111)
Chloride: 103 mmol/L (ref 98–111)
Creatinine, Ser: 2.61 mg/dL — ABNORMAL HIGH (ref 0.44–1.00)
Creatinine, Ser: 2.15 mg/dL — ABNORMAL HIGH (ref 0.44–1.00)
GFR, Estimated: 16 mL/min — ABNORMAL LOW (ref >60)
GFR, Estimated: 20 mL/min — ABNORMAL LOW (ref >60)
Glucose, Bld: 139 mg/dL — ABNORMAL HIGH (ref 70–99)
Glucose, Bld: 124 mg/dL — ABNORMAL HIGH (ref 70–99)
Potassium: 6 mmol/L — ABNORMAL HIGH (ref 3.5–5.1)
Potassium: 3.6 mmol/L (ref 3.5–5.1)
Sodium: 138 mmol/L (ref 135–145)
Sodium: 139 mmol/L (ref 135–145)
Total Bilirubin: 0.6 mg/dL (ref 0.3–1.2)
Total Bilirubin: 0.4 mg/dL (ref 0.3–1.2)
Total Protein: 6.8 g/dL (ref 6.5–8.1)
Total Protein: 6.3 g/dL — ABNORMAL LOW (ref 6.5–8.1)

## 2022-04-17 LAB — MAGNESIUM: Magnesium: 3 mg/dL — ABNORMAL HIGH (ref 1.7–2.4)

## 2022-04-17 LAB — GLUCOSE, CAPILLARY
Glucose-Capillary: 139 mg/dL — ABNORMAL HIGH (ref 70–99)
Glucose-Capillary: 138 mg/dL — ABNORMAL HIGH (ref 70–99)
Glucose-Capillary: 232 mg/dL — ABNORMAL HIGH (ref 70–99)
Glucose-Capillary: 127 mg/dL — ABNORMAL HIGH (ref 70–99)

## 2022-04-17 LAB — HEMOGLOBIN A1C
Hgb A1c MFr Bld: 6.3 % — ABNORMAL HIGH (ref 4.8–5.6)
Mean Plasma Glucose: 134.11 mg/dL

## 2022-04-17 MED ORDER — INSULIN ASPART 100 UNIT/ML IJ SOLN
0.0000 [IU] | Freq: Three times a day (TID) | INTRAMUSCULAR | Status: DC
  Administered 2022-04-17 (×2): 1 [IU] via SUBCUTANEOUS
  Administered 2022-04-17: 3 [IU] via SUBCUTANEOUS
  Administered 2022-04-18: 1 [IU] via SUBCUTANEOUS
  Administered 2022-04-18: 2 [IU] via SUBCUTANEOUS
  Administered 2022-04-19 (×3): 1 [IU] via SUBCUTANEOUS
  Administered 2022-04-20: 2 [IU] via SUBCUTANEOUS
  Administered 2022-04-21 (×2): 1 [IU] via SUBCUTANEOUS
  Administered 2022-04-21: 2 [IU] via SUBCUTANEOUS
  Administered 2022-04-22: 1 [IU] via SUBCUTANEOUS
  Administered 2022-04-22: 2 [IU] via SUBCUTANEOUS
  Administered 2022-04-22 – 2022-04-23 (×2): 1 [IU] via SUBCUTANEOUS
  Administered 2022-04-23: 2 [IU] via SUBCUTANEOUS
  Administered 2022-04-23 – 2022-04-24 (×4): 1 [IU] via SUBCUTANEOUS
  Administered 2022-04-25: 2 [IU] via SUBCUTANEOUS

## 2022-04-17 MED ORDER — ALBUTEROL SULFATE (2.5 MG/3ML) 0.083% IN NEBU
2.5000 mg | INHALATION_SOLUTION | RESPIRATORY_TRACT | Status: DC | PRN
  Administered 2022-04-17 – 2022-04-22 (×3): 2.5 mg via RESPIRATORY_TRACT
  Filled 2022-04-17 (×3): qty 3

## 2022-04-17 MED ORDER — MIRTAZAPINE 15 MG PO TABS
30.0000 mg | ORAL_TABLET | Freq: Every day | ORAL | Status: DC
  Administered 2022-04-17 – 2022-04-18 (×2): 30 mg via ORAL
  Filled 2022-04-17 (×2): qty 2

## 2022-04-17 MED ORDER — ONDANSETRON HCL 4 MG PO TABS: 4.0000 mg | ORAL_TABLET | Freq: Four times a day (QID) | ORAL | Status: DC | PRN

## 2022-04-17 MED ORDER — HYDROCODONE-ACETAMINOPHEN 5-325 MG PO TABS
1.0000 | ORAL_TABLET | Freq: Two times a day (BID) | ORAL | Status: DC
  Administered 2022-04-17 – 2022-04-19 (×4): 1 via ORAL
  Filled 2022-04-17 (×7): qty 1

## 2022-04-17 MED ORDER — MIRABEGRON ER 25 MG PO TB24
25.0000 mg | ORAL_TABLET | Freq: Every day | ORAL | Status: DC
  Administered 2022-04-17 – 2022-04-24 (×8): 25 mg via ORAL
  Filled 2022-04-17 (×8): qty 1

## 2022-04-17 MED ORDER — LEVOTHYROXINE SODIUM 25 MCG PO TABS
25.0000 ug | ORAL_TABLET | Freq: Every day | ORAL | Status: DC
  Administered 2022-04-17 – 2022-04-24 (×6): 25 ug via ORAL
  Filled 2022-04-17 (×6): qty 1

## 2022-04-17 MED ORDER — ASPIRIN 81 MG PO CHEW
81.0000 mg | CHEWABLE_TABLET | Freq: Every day | ORAL | Status: DC
  Administered 2022-04-17 – 2022-04-24 (×8): 81 mg via ORAL
  Filled 2022-04-17 (×8): qty 1

## 2022-04-17 MED ORDER — HEPARIN SODIUM (PORCINE) 5000 UNIT/ML IJ SOLN
5000.0000 [IU] | Freq: Three times a day (TID) | INTRAMUSCULAR | Status: DC
Start: 2022-04-17 — End: 2022-04-25
  Administered 2022-04-17 – 2022-04-24 (×20): 5000 [IU] via SUBCUTANEOUS
  Filled 2022-04-17 (×22): qty 1

## 2022-04-17 MED ORDER — ALPRAZOLAM 0.25 MG PO TABS
1.0000 mg | ORAL_TABLET | Freq: Three times a day (TID) | ORAL | Status: DC | PRN
  Administered 2022-04-18 – 2022-04-23 (×5): 1 mg via ORAL
  Filled 2022-04-17 (×7): qty 4

## 2022-04-17 MED ORDER — SIMVASTATIN 20 MG PO TABS
40.0000 mg | ORAL_TABLET | Freq: Every day | ORAL | Status: DC
  Administered 2022-04-17 – 2022-04-23 (×6): 40 mg via ORAL
  Filled 2022-04-17 (×8): qty 2

## 2022-04-17 MED ORDER — SERTRALINE HCL 100 MG PO TABS
200.0000 mg | ORAL_TABLET | Freq: Every day | ORAL | Status: DC
  Administered 2022-04-17 – 2022-04-24 (×8): 200 mg via ORAL
  Filled 2022-04-17 (×8): qty 2

## 2022-04-17 MED ORDER — ONDANSETRON HCL 4 MG/2ML IJ SOLN
4.0000 mg | Freq: Four times a day (QID) | INTRAMUSCULAR | Status: DC | PRN
Start: 2022-04-17 — End: 2022-04-20

## 2022-04-17 MED ORDER — ACETAMINOPHEN 650 MG RE SUPP: 650.0000 mg | Freq: Four times a day (QID) | RECTAL | Status: DC | PRN

## 2022-04-17 MED ORDER — INSULIN ASPART 100 UNIT/ML IJ SOLN: 0.0000 [IU] | Freq: Every day | INTRAMUSCULAR | Status: DC

## 2022-04-17 MED ORDER — ACETAMINOPHEN 325 MG PO TABS: 650.0000 mg | ORAL_TABLET | Freq: Four times a day (QID) | ORAL | Status: DC | PRN

## 2022-04-17 MED ORDER — BUPROPION HCL ER (XL) 150 MG PO TB24
150.0000 mg | ORAL_TABLET | Freq: Every day | ORAL | Status: DC
  Administered 2022-04-17 – 2022-04-24 (×8): 150 mg via ORAL
  Filled 2022-04-17 (×8): qty 1

## 2022-04-17 MED ORDER — CARVEDILOL 6.25 MG PO TABS
6.2500 mg | ORAL_TABLET | Freq: Two times a day (BID) | ORAL | Status: DC
  Administered 2022-04-17 – 2022-04-24 (×15): 6.25 mg via ORAL
  Filled 2022-04-17 (×17): qty 1

## 2022-04-17 MED ORDER — PANTOPRAZOLE SODIUM 40 MG PO TBEC
40.0000 mg | DELAYED_RELEASE_TABLET | Freq: Two times a day (BID) | ORAL | Status: DC
  Administered 2022-04-17 – 2022-04-24 (×15): 40 mg via ORAL
  Filled 2022-04-17 (×17): qty 1

## 2022-04-17 NOTE — Assessment & Plan Note (Signed)
Stable.  Continue Protonix 40 mg twice daily. 

## 2022-04-17 NOTE — TOC Initial Note (Signed)
Transition of Care [facility]) - Initial/Assessment Note    Patient Details  Name: [name] MRN: [id number] Date of Birth: December 03, 1922  Transition of Care [facility]) CM/SW Contact:    Carles Collet, RN Phone Number: 04/17/2022, 10:31 AM  Clinical Narrative:             Patient admitted from [facility] after falling while attempting to get into her [facility] that was left out of reach by handyman in her apartment (per H&P).     Treating for ARF, dehydration, hyperkalemia. Anticipate return to ALF at DC.   Expected Discharge Plan: Assisted Living Barriers to Discharge: Continued Medical Work up   Patient Goals and CMS Choice        Expected Discharge Plan and Services Expected Discharge Plan: Assisted Living In-house Referral: Clinical Social Work     Living arrangements for the past 2 months: [place]                                      Prior Living Arrangements/Services Living arrangements for the past 2 months: [place]                Current home services: DME    Activities of Daily Living Home Assistive Devices/Equipment: Environmental consultant (specify type) ADL Screening (condition at time of admission) Patient's cognitive ability adequate to safely complete daily activities?: Yes Is the patient deaf or have difficulty hearing?: Yes Does the patient have difficulty seeing, even when wearing glasses/contacts?: No Does the patient have difficulty concentrating, remembering, or making decisions?: No Patient able to express need for assistance with ADLs?: Yes Does the patient have difficulty dressing or bathing?: Yes Independently performs ADLs?: No Communication: Independent Dressing (OT): Needs assistance Is this a change from baseline?: Pre-admission baseline Grooming: Needs assistance Is this a change from baseline?: Pre-admission baseline Feeding: Independent Bathing: Needs assistance Is this a change from baseline?:  Pre-admission baseline Toileting: Needs assistance Is this a change from baseline?: Pre-admission baseline In/Out Bed: Needs assistance Is this a change from baseline?: Pre-admission baseline Walks in Home: Needs assistance Is this a change from baseline?: Pre-admission baseline Does the patient have difficulty walking or climbing stairs?: Yes Weakness of Legs: Both Weakness of Arms/Hands: Both  Permission Sought/Granted                  Emotional Assessment              Admission diagnosis:  Hyperkalemia [E87.5] Lactic acidosis [E87.20] AKI (acute kidney injury) ([place]) [N17.9] Fall, initial encounter [W19.XXXA] Diarrhea, unspecified type [R19.7] Acute renal failure superimposed on stage 3 chronic kidney disease, unspecified acute renal failure type, unspecified whether stage 3a or 3b CKD ([place]) [N17.9, N18.30] Patient Active Problem List   Diagnosis Date Noted   Acute renal failure superimposed on stage 3a chronic kidney disease ([place]) 04/17/2022   DNR (do not resuscitate)/DNI(Do No Intubate) 04/17/2022   Hyperkalemia 04/17/2022   Microcytic anemia 04/07/2021   Iron deficiency anemia 04/07/2021   Stage 3a chronic kidney disease (CKD) ([place]) - baseline SCr 1.25-1.5 04/07/2021   Pancreas cyst 04/07/2021   Hypothyroid 04/07/2021   Palliative care encounter    Reactive airway disease 12/09/2017   Anxiety and depression 12/09/2017   Normocytic anemia 04/13/2016   Chronic combined systolic and diastolic congestive heart failure ([place]) 11/14/2013   Constipation 11/14/2013   Peripheral neuropathy 05/05/2012   Chronic  systolic HF (heart failure) ([place]) 05/04/2012   Arthritis 07/23/2011   DYSPNEA 07/31/2010   DEPRESSIVE DISORDER NOT ELSEWHERE CLASSIFIED 02/08/2009   Type 2 diabetes mellitus with diabetic neuropathy, without long-term current use of insulin ([place]) 11/08/2008   HLD (hyperlipidemia) 11/08/2008   Essential hypertension 11/08/2008   LBBB (left bundle branch block)  11/08/2008   Atrial fibrillation ([place]) 11/08/2008   GERD 11/08/2008   HEPATIC CYST 11/08/2008   CHOLELITHIASIS 11/08/2008   PCP:  Mayra Neer, MD Pharmacy:   [facility] - [place], [place] [address] Phone: [phone] Fax: [place], [place] - [address] AT [facility] [address] [place] [address] Phone: [phone] Fax: [phone]     Social Determinants of Health (SDOH) Interventions    Readmission Risk Interventions     View : No data to display.

## 2022-04-17 NOTE — Progress Notes (Signed)
[facility]                                                                              [name], is a 88 y.o. female, DOB - June 22, 1923, [id number] Admit date - 04/16/2022    Outpatient Primary MD for the patient is Mayra Neer, MD  LOS - 0  days  Chief Complaint  Patient presents with   Fall       Brief summary   Patient is a 88 year old female with history of DM, CKD stage III, HTN, chronic systolic CHF EF of 30 to [id number] presented from [facility], ALF with a fall.  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  He had pushed her wheelchair out of the way to perform his duties.  He failed to put the wheelchair back next to her when he left.  Patient ported that she was trying to use her walker to get to the wheelchair and fell when trying to get into the wheelchair. Patient had been treated with laxatives for the last 2 days due to constipation and subsequently had massive amounts of diarrhea. Creatinine 2.6, BUN 30, potassium 6.0 Patient was admitted for further work-up.    Assessment & Plan    Principal Problem:   Acute renal failure superimposed on stage 3a chronic kidney disease (HCC) -Likely due to significant diarrhea from laxatives -Presented with creatinine of 2.6, plateaued at 2.8, baseline creatinine 1.2-1.5 -Was placed on IV fluid hydration, creatinine now improving to 2.1 -Follow I's and O's closely, has history of chronic systolic CHF, 30 to [id number], losartan, torsemide and Aldactone currently on hold  Active Problems:   Hyperkalemia -Potassium 7.1 on admission, improved to 3.6    Type 2 diabetes mellitus with diabetic neuropathy, without long-term current use of insulin (HCC) -Hemoglobin A1c 6.3, continue sliding scale insulin     Essential hypertension -BP currently stable, continue Coreg -For now holding Cozaar, Aldactone, torsemide due to AKI     Atrial fibrillation  (HCC) -Currently rate controlled, continue Coreg -Not on anticoagulation due to history of falls    GERD -Continue PPI    Chronic combined systolic and diastolic congestive heart failure (HCC) -2D echo May/2022 showed EF of 30 to 35% -Currently compensated, holding torsemide, Aldactone due to acute kidney injury     Stage 3a chronic kidney disease (CKD) (HCC) - baseline SCr 1.25-1.5   Hypothyroid -Continue Synthroid   Code Status: DNR/DNI DVT Prophylaxis:  heparin injection 5,000 Units Start: 04/17/22 0600 SCDs Start: 04/17/22 0236   Level of Care: Level of care: Med-Surg Family Communication: Updated patient   Disposition Plan:      Remains inpatient appropriate: Pending skilled nursing facility   Procedures:  None  Consultants:   None  Antimicrobials:  None   Medications  aspirin  81 mg Oral Daily   buPROPion  150 mg Oral Daily   carvedilol  6.25 mg Oral BID   heparin  5,000 Units Subcutaneous Q8H   HYDROcodone-acetaminophen  1 tablet Oral BID   insulin aspart  0-5 Units Subcutaneous QHS   insulin aspart  0-9 Units Subcutaneous TID WC   levothyroxine  25 mcg Oral QAC breakfast   mirabegron ER  25 mg Oral Daily   mirtazapine  30 mg Oral QHS   pantoprazole  40 mg Oral BID   sertraline  200 mg Oral Daily   simvastatin  40 mg Oral QHS      Subjective:   [name] was seen and examined today.  Feeling generalized debility with falls.  Denies hest pain, shortness of breath, abdominal pain, N/V. Objective:   Vitals:   04/17/22 0220 04/17/22 0257 04/17/22 0348 04/17/22 0749  BP:  (!) 134/95  138/72  Pulse:  81  76  Resp:  17  16  Temp: 98.1 F (36.7 C) 98.2 F (36.8 C)  98.8 F (37.1 C)  TempSrc: Oral Oral  Oral  SpO2:  100% 99% 100%  Weight:      Height:        Intake/Output Summary (Last 24 hours) at 04/17/2022 1417 Last data filed at 04/17/2022 0128 Gross per 24 hour  Intake 1043.11 ml  Output --  Net 1043.11 ml     Wt Readings  from Last 3 Encounters:  04/16/22 96 kg  04/15/22 96 kg  03/22/22 96 kg     Exam General: Alert and oriented x 3, NAD Cardiovascular: S1 S2 auscultated,  RRR Respiratory: Clear to auscultation bilaterally, no wheezing Gastrointestinal: Soft, nontender, nondistended, + bowel sounds Ext: no pedal edema bilaterally Neuro: Strength 5/5 upper and lower extremities bilaterally Psych: Normal affect and demeanor, alert and oriented x3     Data Reviewed:  I have personally reviewed following labs    CBC Lab Results  Component Value Date   WBC 13.3 (H) 04/17/2022   RBC 4.00 04/17/2022   HGB 12.1 04/17/2022   HCT 37.0 04/17/2022   MCV 92.5 04/17/2022   MCH 30.3 04/17/2022   PLT 148 (L) 04/17/2022   MCHC 32.7 04/17/2022   RDW 14.4 04/17/2022   LYMPHSABS 1.7 04/17/2022   MONOABS 1.0 04/17/2022   EOSABS 0.1 04/17/2022   BASOSABS 0.0 [id number]     Last metabolic panel Lab Results  Component Value Date   NA 139 04/17/2022   K 3.6 04/17/2022   CL 103 04/17/2022   CO2 26 04/17/2022   BUN 29 (H) 04/17/2022   CREATININE 2.15 (H) 04/17/2022   GLUCOSE 124 (H) 04/17/2022   GFRNONAA 20 (L) 04/17/2022   GFRAA 37 (L) 07/02/2019   CALCIUM 8.9 04/17/2022   PROT 6.3 (L) 04/17/2022   ALBUMIN 3.3 (L) 04/17/2022   BILITOT 0.4 04/17/2022   ALKPHOS 58 04/17/2022   AST 69 (H) 04/17/2022   ALT 28 04/17/2022   ANIONGAP 10 04/17/2022    CBG (last 3)  Recent Labs    04/17/22 0729 04/17/22 1227  GLUCAP 139* 138*      Coagulation Profile: Recent Labs  Lab 04/16/22 2237  INR 1.2     Radiology Studies: I have personally reviewed the imaging studies  CT ABDOMEN PELVIS WO CONTRAST  Result Date: 04/17/2022 CLINICAL DATA:  Acute abdominal pain, abdominal trauma. EXAM: CT ABDOMEN AND PELVIS WITHOUT CONTRAST TECHNIQUE: Multidetector CT imaging of the abdomen and pelvis was performed following the standard protocol without IV contrast. RADIATION DOSE REDUCTION: This exam was  performed according to the departmental dose-optimization program which includes automated exposure control, adjustment of the mA and/or kV according to patient size and/or use of iterative reconstruction technique. COMPARISON:  CT abdomen and pelvis 04/06/2021 FINDINGS: Lower chest:  No acute abnormality. Hepatobiliary: No focal liver abnormality is seen. Status post cholecystectomy. No biliary dilatation. Pancreas: Small low-attenuation/cystic lesions in the pancreas measuring 14 mm in the pancreatic head and 12 mm in the pancreatic tail appear unchanged. No acute inflammation. No ductal dilatation. Spleen: Normal in size without focal abnormality. Adrenals/Urinary Tract: No hydronephrosis or urinary tract calculus. Right renal cysts measuring up to 5.2 cm appear unchanged. Adrenal glands and bladder are within normal limits. Stomach/Bowel: Stomach is within normal limits. No evidence of bowel wall thickening, distention, or inflammatory changes. There is sigmoid colon diverticulosis without evidence for diverticulitis. The appendix is not seen. Vascular/Lymphatic: Aortic atherosclerosis. No enlarged abdominal or pelvic lymph nodes. Reproductive: This area is not well evaluated secondary to streak artifact in the pelvis. Other: No abdominal wall hernia or abnormality. No abdominopelvic ascites. Musculoskeletal: The bones are osteopenic. Degenerative changes affect the spine. Bilateral hip arthroplasties are present. IMPRESSION: 1. No acute localizing process in the abdomen or pelvis. 2. Stable cystic lesions in the pancreas. 3. Sigmoid colon diverticulosis. 4. Stable renal cysts measuring up to 5.2 cm. 5.  Aortic Atherosclerosis (ICD10-I70.0). Electronically Signed   By: Ronney Asters M.D.   On: 04/17/2022 00:30   DG Elbow Complete Right  Result Date: 04/16/2022 CLINICAL DATA:  Fall EXAM: RIGHT ELBOW - COMPLETE 3+ VIEW COMPARISON:  None Available. FINDINGS: No fracture or dislocation is seen. The joint spaces  are preserved. The visualized soft tissues are unremarkable. No displaced elbow joint fat pads to suggest an elbow joint effusion. IMPRESSION: Negative. Electronically Signed   By: Julian Hy M.D.   On: 04/16/2022 22:39   DG Wrist Complete Right  Result Date: 04/16/2022 CLINICAL DATA:  Fall EXAM: RIGHT WRIST - COMPLETE 3+ VIEW COMPARISON:  None Available. FINDINGS: No fracture or dislocation is seen. Mild degenerative changes of the 1st carpometacarpal joint and along the radial aspect of the wrist. Negative ulnar variance.  Old ulnar styloid fracture. Visualized soft tissues are within normal limits. IMPRESSION: Negative. Electronically Signed   By: Julian Hy M.D.   On: 04/16/2022 22:43   CT HEAD WO CONTRAST  Result Date: 04/17/2022 CLINICAL DATA:  Fall. EXAM: CT HEAD WITHOUT CONTRAST CT CERVICAL SPINE WITHOUT CONTRAST TECHNIQUE: Multidetector CT imaging of the head and cervical spine was performed following the standard protocol without intravenous contrast. Multiplanar CT image reconstructions of the cervical spine were also generated. RADIATION DOSE REDUCTION: This exam was performed according to the departmental dose-optimization program which includes automated exposure control, adjustment of the mA and/or kV according to patient size and/or use of iterative reconstruction technique. COMPARISON:  CT head 03/22/2022 FINDINGS: CT HEAD FINDINGS Brain: No evidence of acute infarction, hemorrhage, hydrocephalus, extra-axial collection or mass lesion/mass effect. Again seen is mild diffuse atrophy and mild periventricular white matter hypodensity, likely chronic small vessel ischemic change. Vascular: Atherosclerotic calcifications are present within the cavernous internal carotid arteries. Skull: Normal. Negative for fracture or focal lesion. Sinuses/Orbits: No acute finding. Other: None. CT CERVICAL SPINE FINDINGS Alignment: There is trace anterolisthesis at C3-C4 and 2 mm of anterolisthesis  at C5-C6. Findings are favored as degenerative. Alignment is otherwise anatomic. Skull base and vertebrae: The bones are diffusely osteopenic. There is no acute fracture or focal osseous lesion identified. Soft tissues and spinal canal: No prevertebral fluid or swelling. No visible canal hematoma. Disc levels: There is intervertebral disc space narrowing and endplate osteophyte formation at C5-C6 and C6-C7 compatible with degenerative change. Diffuse facet arthropathy is seen bilaterally, right  greater than left causing severe neural foraminal stenosis on the right at C3-C4. There is no severe central canal stenosis at any level. Upper chest: Negative. Other: None. IMPRESSION: 1.  No acute intracranial process. 2. No acute fracture or traumatic subluxation of the cervical spine. 3. Mild diffuse brain atrophy. Mild chronic small vessel ischemic change. 4. Multilevel degenerative changes of the cervical spine with grade 1 anterolisthesis at C3-C4 and C5-C6. Electronically Signed   By: Ronney Asters M.D.   On: 04/17/2022 00:37   CT CERVICAL SPINE WO CONTRAST  Result Date: 04/17/2022 CLINICAL DATA:  Fall. EXAM: CT HEAD WITHOUT CONTRAST CT CERVICAL SPINE WITHOUT CONTRAST TECHNIQUE: Multidetector CT imaging of the head and cervical spine was performed following the standard protocol without intravenous contrast. Multiplanar CT image reconstructions of the cervical spine were also generated. RADIATION DOSE REDUCTION: This exam was performed according to the departmental dose-optimization program which includes automated exposure control, adjustment of the mA and/or kV according to patient size and/or use of iterative reconstruction technique. COMPARISON:  CT head 03/22/2022 FINDINGS: CT HEAD FINDINGS Brain: No evidence of acute infarction, hemorrhage, hydrocephalus, extra-axial collection or mass lesion/mass effect. Again seen is mild diffuse atrophy and mild periventricular white matter hypodensity, likely chronic  small vessel ischemic change. Vascular: Atherosclerotic calcifications are present within the cavernous internal carotid arteries. Skull: Normal. Negative for fracture or focal lesion. Sinuses/Orbits: No acute finding. Other: None. CT CERVICAL SPINE FINDINGS Alignment: There is trace anterolisthesis at C3-C4 and 2 mm of anterolisthesis at C5-C6. Findings are favored as degenerative. Alignment is otherwise anatomic. Skull base and vertebrae: The bones are diffusely osteopenic. There is no acute fracture or focal osseous lesion identified. Soft tissues and spinal canal: No prevertebral fluid or swelling. No visible canal hematoma. Disc levels: There is intervertebral disc space narrowing and endplate osteophyte formation at C5-C6 and C6-C7 compatible with degenerative change. Diffuse facet arthropathy is seen bilaterally, right greater than left causing severe neural foraminal stenosis on the right at C3-C4. There is no severe central canal stenosis at any level. Upper chest: Negative. Other: None. IMPRESSION: 1.  No acute intracranial process. 2. No acute fracture or traumatic subluxation of the cervical spine. 3. Mild diffuse brain atrophy. Mild chronic small vessel ischemic change. 4. Multilevel degenerative changes of the cervical spine with grade 1 anterolisthesis at C3-C4 and C5-C6. Electronically Signed   By: Ronney Asters M.D.   On: 04/17/2022 00:37   DG Pelvis Portable  Result Date: 04/16/2022 CLINICAL DATA:  Fall EXAM: PORTABLE PELVIS 1-2 VIEWS COMPARISON:  None Available. FINDINGS: Bilateral hip arthroplasties. No fracture or dislocation is seen. Visualized bony pelvis appears intact. IMPRESSION: Negative. Electronically Signed   By: Julian Hy M.D.   On: 04/16/2022 22:42   DG Chest Port 1 View  Result Date: 04/16/2022 CLINICAL DATA:  Fall. EXAM: PORTABLE CHEST 1 VIEW COMPARISON:  Chest x-ray 04/15/2022 FINDINGS: The heart size and mediastinal contours are within normal limits. Both lungs are  clear. The visualized skeletal structures are unremarkable. IMPRESSION: No active disease. Electronically Signed   By: Ronney Asters M.D.   On: 04/16/2022 22:39   DG Knee Left Port  Result Date: 04/16/2022 CLINICAL DATA:  Fall EXAM: PORTABLE LEFT KNEE - 1-2 VIEW COMPARISON:  None Available. FINDINGS: No fracture or dislocation is seen. Mild degenerative changes of the patellofemoral joint. Visualized soft tissues are within normal limits. No suprapatellar knee joint effusion. IMPRESSION: Negative. Electronically Signed   By: Julian Hy M.D.   On:  04/16/2022 22:40   DG Knee Right Port  Result Date: 04/16/2022 CLINICAL DATA:  Fall EXAM: PORTABLE RIGHT KNEE - 1-2 VIEW COMPARISON:  None Available. FINDINGS: No fracture or dislocation is seen. Mild tricompartmental degenerative changes. Mild soft tissue swelling along the anterior aspect of the proximal tibia. No suprapatellar knee joint effusion. IMPRESSION: No fracture or dislocation is seen. Mild soft tissue swelling along the anterior aspect of the proximal tibia. Electronically Signed   By: Julian Hy M.D.   On: 04/16/2022 22:41   DG Tibia/Fibula Right Port  Result Date: 04/16/2022 CLINICAL DATA:  Fall EXAM: PORTABLE RIGHT TIBIA AND FIBULA - 2 VIEW COMPARISON:  None Available. FINDINGS: No fracture or dislocation is seen. The visualized soft tissues are unremarkable. IMPRESSION: Negative. Electronically Signed   By: Julian Hy M.D.   On: 04/16/2022 22:42   DG Ankle Right Port  Result Date: 04/16/2022 CLINICAL DATA:  Fall EXAM: PORTABLE RIGHT ANKLE - 2 VIEW COMPARISON:  None Available. FINDINGS: No fracture or dislocation is seen. The ankle mortise is intact. The base of the fifth metatarsal is unremarkable. Visualized soft tissues are within normal limits. IMPRESSION: Negative. Electronically Signed   By: Julian Hy M.D.   On: 04/16/2022 22:39       [name] M.D. [facility] 04/17/2022, 2:17 PM  Available  via Epic secure chat 7am-7pm After 7 pm, please refer to night coverage provider listed on amion.

## 2022-04-17 NOTE — Assessment & Plan Note (Signed)
Stable.  Continue Synthroid 25 mcg daily.

## 2022-04-17 NOTE — H&P (Signed)
History and Physical    SYENNA NAZIR UVO:[id number] DOB: Mar 29, 1923 DOA: 04/16/2022  DOS: the patient was seen and examined on 04/16/2022  PCP: Mayra Neer, MD   Patient coming from: [facility]  I have personally briefly reviewed patient's old medical records in [place]  Chief complaint: [place] out of her wheelchair History of present illness: 88 year old female history of type 2 diabetes, chronic kidney disease stage III, hypertension, chronic systolic heart failure EF of 30 to 35%, coming from [facility] assisted living facility.  Patient states that she had a maintenance man in her living room working on her apartment.  He had pushed her wheelchair out of the way to perform his duties.  He failed to put the wheelchair back next to her when he left.  Patient states that she was try to use her walker to get to her wheelchair.  She fell when trying to get into a wheelchair.  Patient's been treated with laxatives last 2 days due to constipation.  She had massive amounts of diarrhea and EMS and on the way to the hospital.  Patient denies any chest pain, shortness of breath or nausea.   Arrival temp 99 heart rate 73 blood pressure 119/53  Labs:  Sodium 138, Tessman 6.0, bicarb 25, BUN of 30, creatinine 2.6 After treatment with IV calcium, IV insulin, p.o. Lokelma, repeat potassium 3.8 Patient given a liter of IV fluids.  Repeat BUN of 29, creatinine 2.45.  White count of 15.3, hemato-point 9.  EKG which I personally reviewed and interpreted shows normal sinus rhythm with a IVCD.  First-degree AV block.  CT abdomen pelvis without contrast which I personally reviewed and interpreted shows no stool in the rectum.  No inflammatory changes.  CT head which I personally reviewed interpreted shows no acute intracranial abnormality.  CT C-spine which I personally viewed interpreted shows no acute fractures.  There was severe neuroforaminal stenosis at C3-4 right  greater than left  Due to the patient's acute kidney injury and hyperkalemia, [facility] hospitalist contacted for admission.     ED Course: Hyperkalemia 6.0 noted.  Patient treated with IV calcium, IV insulin, p.o. Lokelma.  Review of Systems:  Review of Systems  Constitutional: Negative.   HENT: Negative.    Eyes: Negative.   Respiratory: Negative.    Cardiovascular: Negative.   Gastrointestinal:  Positive for diarrhea.  Musculoskeletal:  Positive for falls.  Skin: Negative.   Neurological: Negative.   Endo/Heme/Allergies: Negative.   Psychiatric/Behavioral: Negative.    All other systems reviewed and are negative.  Past Medical History:  Diagnosis Date   Atrial fibrillation ([place])    Cholelithiasis    Constipation    Diabetes mellitus    Dizziness    with some gait disability   GERD (gastroesophageal reflux disease)    Hepatic cyst    Hyperlipidemia    Hypertension    Ischemic cardiomyopathy    LBBB (left bundle branch block)    Macular degeneration    Other postprocedural status(V45.89)    Peripheral edema     Past Surgical History:  Procedure Laterality Date   ANKLE SURGERY Bilateral    fractures   CHOLECYSTECTOMY     Dilation and curettage     HIP ARTHROPLASTY  05/05/2012   Procedure: ARTHROPLASTY BIPOLAR HIP;  Surgeon: Mauri Pole, MD;  Location: [facility];  Service: Orthopedics;  Laterality: Left;   TONSILLECTOMY     WRIST SURGERY Bilateral    wrist fractures  reports that she has never smoked. She has never used smokeless tobacco. She reports that she does not drink alcohol and does not use drugs.  Allergies  Allergen Reactions   Fosamax [Alendronate Sodium] Swelling   Penicillins Swelling and Other (See Comments)    Swelling, redness and warmth at injection site Has patient had a PCN reaction causing immediate rash, facial/tongue/throat swelling, SOB or lightheadedness with hypotension: No Has patient had a PCN reaction causing severe rash  involving mucus membranes or skin necrosis: No Has patient had a PCN reaction that required hospitalization: No Has patient had a PCN reaction occurring within the last 10 years: No If all of the above answers are "NO", then may proceed with Cephalosporin use.   Sulfonamide Derivatives Swelling   Tetanus Toxoid Swelling    Family History  Problem Relation Age of Onset   Leukemia Mother    Arthritis Father    Heart failure Brother        died age 28   Arthritis Brother     Prior to Admission medications   Medication Sig Start Date End Date Taking? Authorizing Provider  acetaminophen (TYLENOL) 500 MG tablet Take 500 mg by mouth daily as needed for moderate pain.   Yes [provider]  albuterol (VENTOLIN HFA) 108 (90 Base) MCG/ACT inhaler Inhale 2 puffs into the lungs every 4 (four) hours as needed for wheezing or shortness of breath.   Yes [provider]  ALPRAZolam Duanne Moron) 1 MG tablet Take 1 tablet (1 mg total) by mouth 3 (three) times daily as needed for anxiety. Patient taking differently: Take 1.5 mg by mouth 3 (three) times daily. 04/11/21  Yes Nita Sells, MD  Alum & Mag Hydroxide-Simeth (GERI-LANTA PO) Take 30 mLs by mouth in the morning and at bedtime.   Yes [provider]  aspirin 81 MG chewable tablet Chew 81 mg by mouth daily.   Yes [provider]  Benzocaine-Menthol (CHLORASEPTIC SORE THROAT MT) Use as directed 2 sprays in the mouth or throat every 4 (four) hours as needed (sore throat).   Yes [provider]  benzonatate (TESSALON) 100 MG capsule Take 100 mg by mouth 3 (three) times daily as needed for cough.   Yes [provider]  bisacodyl (DULCOLAX) 5 MG EC tablet Take 10 mg by mouth daily as needed for moderate constipation.   Yes [provider]  buPROPion (WELLBUTRIN XL) 150 MG 24 hr tablet Take 150 mg by mouth daily.   Yes [provider]  carvedilol (COREG) 6.25 MG tablet Take 6.25 mg  by mouth 2 (two) times daily. 09/03/17  Yes [provider]  colestipol (COLESTID) 1 g tablet Take 2 tablets (2 g total) by mouth daily. Hold FOR CONSTIPATION 12/11/17  Yes Irene Pap N, DO  docusate sodium (COLACE) 100 MG capsule Take 100 mg by mouth 2 (two) times daily.   Yes [provider]  Emollient (EUCERIN ORIGINAL HEALING) LOTN Apply 1 application. topically daily. Apply to arms,legs and back   Yes [provider]  ferrous sulfate 325 (65 FE) MG tablet Take 1 tablet (325 mg total) by mouth daily with breakfast. 04/12/21  Yes Nita Sells, MD  guaiFENesin (MUCINEX) 600 MG 12 hr tablet Take 600 mg by mouth 2 (two) times daily as needed for cough (congestion).   Yes [provider]  guaifenesin (ROBITUSSIN) 100 MG/5ML syrup Take 100 mg by mouth every 8 (eight) hours as needed for cough.   Yes [provider]  HYDROcodone-acetaminophen (NORCO/VICODIN) 5-325 MG tablet Take 1 tablet by mouth in the morning and at bedtime.   Yes [provider]  HYDROcodone-acetaminophen (NORCO/VICODIN) 5-325 MG tablet Take 1 tablet by mouth 2 (two) times daily as needed for moderate pain.   Yes [provider]  hydrOXYzine (ATARAX) 10 MG tablet Take 10 mg by mouth 3 (three) times daily as needed for itching.   Yes [provider]  Hypertonic Nasal Wash (SINUS RINSE KIT) PACK Place 1 each into the nose every hour as needed (congestion).   Yes [provider]  lactobacillus acidophilus & bulgar (LACTINEX) chewable tablet Chew 1 tablet by mouth daily.   Yes [provider]  levothyroxine (SYNTHROID, LEVOTHROID) 25 MCG tablet Take 25 mcg by mouth daily before breakfast.   Yes [provider]  loperamide (IMODIUM A-D) 2 MG tablet Take 2-4 mg by mouth See admin instructions. 4 mg after first loose stool, then 2 mg after each loose stool   Yes [provider]  losartan (COZAAR) 50 MG tablet Take 0.5 tablets  (25 mg total) by mouth daily. 04/11/21  Yes Nita Sells, MD  magnesium hydroxide (MILK OF MAGNESIA) 400 MG/5ML suspension Take 15 mLs by mouth daily as needed for mild constipation.   Yes [provider]  Menthol, Topical Analgesic, (BIOFREEZE) 4 % GEL Apply 1 application topically 3 (three) times daily as needed (bilateral hip pain).   Yes [provider]  mirabegron ER (MYRBETRIQ) 25 MG TB24 tablet Take 25 mg by mouth daily.   Yes [provider]  mirtazapine (REMERON) 30 MG tablet Take 1 tablet (30 mg total) by mouth at bedtime. 04/11/21  Yes Nita Sells, MD  Multiple Vitamins-Minerals (PRESERVISION AREDS PO) Take 1 capsule by mouth daily.   Yes [provider]  mupirocin ointment (BACTROBAN) 2 % Apply 1 application. topically daily.   Yes [provider]  NON FORMULARY Take 2.5 mLs by mouth 3 (three) times daily as needed (sore throat). Salt and water solution   Yes [provider]  ondansetron (ZOFRAN) 4 MG tablet Take 4 mg by mouth daily as needed for nausea or vomiting.   Yes [provider]  pantoprazole (PROTONIX) 40 MG tablet Take 1 tablet (40 mg total) by mouth 2 (two) times daily. 04/11/21  Yes Nita Sells, MD  polyethylene glycol powder (GLYCOLAX/MIRALAX) 17 GM/SCOOP powder Take 17 g by mouth daily.   Yes [provider]  Powders (GOLD BOND BABY POWDER EX) Apply 1 application. topically 2 (two) times daily as needed (itching).   Yes [provider]  Pramoxine-Camphor-Zinc Acetate (ANTI ITCH EX) Apply 1 application topically 3 (three) times daily as needed (itching).   Yes [provider]  sertraline (ZOLOFT) 100 MG tablet Take 1 tablet (100 mg total) by mouth daily. Patient taking differently: Take 200 mg by mouth daily. 04/11/21  Yes Nita Sells, MD  simvastatin (ZOCOR) 40 MG tablet Take 40 mg by mouth at bedtime.   Yes [provider]  spironolactone  (ALDACTONE) 25 MG tablet Take 0.5 tablets (12.5 mg total) by mouth daily. 02/21/17  Yes Josue Hector, MD  torsemide (DEMADEX) 10 MG tablet Take 1 tablet (10 mg total) by mouth daily. 04/12/21  Yes Nita Sells, MD  torsemide (DEMADEX) 20 MG tablet Take 20 mg by mouth daily as needed (leg swelling).   Yes [provider]  triamcinolone cream (KENALOG) 0.1 % 1 application 2 (two) times daily as needed (itching). perineum  Yes [provider]  Zinc Oxide (DESITIN) 13 % CREA Apply 1 application topically See admin instructions. Cleanse groin area under breast with soap and water, pat dry and apply ointment on area as needed for redness.   Yes [provider]    Physical Exam: Vitals:   04/16/22 2200 04/16/22 2230 04/16/22 2315 04/16/22 2345  BP: 108/70 116/64 125/65 137/60  Pulse: 70 77 72 77  Resp: (!) '21 15 17 20  ' Temp:      TempSrc:      SpO2: 94% 96% 94% 95%  Weight:      Height:        Physical Exam Vitals and nursing note reviewed.  Constitutional:      General: She is not in acute distress.    Appearance: She is not ill-appearing, toxic-appearing or diaphoretic.     Comments: 88 year old female appears younger than stated age of 88 years old.  She is alert and orient x3.  She knows that she is at [facility].  It is May 2023.  HENT:     Head: Normocephalic and atraumatic.     Nose: Nose normal.  Cardiovascular:     Rate and Rhythm: Normal rate and regular rhythm.  Pulmonary:     Effort: Pulmonary effort is normal. No respiratory distress.     Breath sounds: No wheezing or rales.  Abdominal:     General: Bowel sounds are normal. There is no distension.     Tenderness: There is no abdominal tenderness. There is no guarding.  Musculoskeletal:     Right lower leg: No edema.     Left lower leg: No edema.  Skin:    General: Skin is warm and dry.     Capillary Refill: Capillary refill takes less than 2 seconds.  Neurological:      General: No focal deficit present.     Mental Status: She is alert and oriented to person, place, and time.     Labs on Admission: I have personally reviewed following labs and imaging studies  CBC: Recent Labs  Lab 04/15/22 1034 04/15/22 1400 04/16/22 2237 04/16/22 2243  WBC 9.7  --  15.3*  --   NEUTROABS 7.4  --   --   --   HGB 13.7 13.3 12.9 13.3  HCT 40.3 39.0 40.6 39.0  MCV 89.8  --  93.5  --   PLT 152  --  162  --    Basic Metabolic Panel: Recent Labs  Lab 04/15/22 1034 04/15/22 1400 04/16/22 2237 04/16/22 2243 04/16/22 2328  NA 141 140 138 134* 138  K 3.7 3.3* 6.0* 7.1* 3.8  CL 106  --  99 101 101  CO2 24  --  25  --  26  GLUCOSE 196*  --  139* 135* 122*  BUN 16  --  30* 47* 29*  CREATININE 1.35*  --  2.61* 2.80* 2.45*  CALCIUM 9.3  --  9.2  --  9.2   GFR: Estimated Creatinine Clearance: 17.1 mL/min (A) (by C-G formula based on SCr of 2.45 mg/dL (H)). Liver Function Tests: Recent Labs  Lab 04/15/22 1034 04/16/22 2237  AST 23 103*  ALT 16 23  ALKPHOS 64 58  BILITOT 0.5 0.6  PROT 7.2 6.8  ALBUMIN 3.7 3.6   No results for input(s): LIPASE, AMYLASE in the last 168 hours. No results for input(s): AMMONIA in the last 168 hours. Coagulation Profile: Recent Labs  Lab 04/16/22 2237  INR  1.2   Cardiac Enzymes: Recent Labs  Lab 04/15/22 1034 04/15/22 1225  TROPONINIHS 11 12   BNP (last 3 results) No results for input(s): PROBNP in the last 8760 hours. HbA1C: No results for input(s): HGBA1C in the last 72 hours. CBG: No results for input(s): GLUCAP in the last 168 hours. Lipid Profile: No results for input(s): CHOL, HDL, LDLCALC, TRIG, CHOLHDL, LDLDIRECT in the last 72 hours. Thyroid Function Tests: No results for input(s): TSH, T4TOTAL, FREET4, T3FREE, THYROIDAB in the last 72 hours. Anemia Panel: No results for input(s): VITAMINB12, FOLATE, FERRITIN, TIBC, IRON, RETICCTPCT in the last 72 hours. Urine analysis:    Component Value Date/Time    COLORURINE STRAW (A) 03/23/2022 0304   APPEARANCEUR CLEAR 03/23/2022 0304   LABSPEC 1.006 03/23/2022 0304   PHURINE 7.0 03/23/2022 0304   GLUCOSEU NEGATIVE 03/23/2022 0304   HGBUR NEGATIVE 03/23/2022 0304   BILIRUBINUR NEGATIVE 03/23/2022 0304   KETONESUR NEGATIVE 03/23/2022 0304   PROTEINUR NEGATIVE 03/23/2022 0304   UROBILINOGEN 0.2 06/21/2014 1924   NITRITE NEGATIVE 03/23/2022 0304   LEUKOCYTESUR MODERATE (A) 03/23/2022 0304    Radiological Exams on Admission: I have personally reviewed images CT ABDOMEN PELVIS WO CONTRAST  Result Date: 04/17/2022 CLINICAL DATA:  Acute abdominal pain, abdominal trauma. EXAM: CT ABDOMEN AND PELVIS WITHOUT CONTRAST TECHNIQUE: Multidetector CT imaging of the abdomen and pelvis was performed following the standard protocol without IV contrast. RADIATION DOSE REDUCTION: This exam was performed according to the departmental dose-optimization program which includes automated exposure control, adjustment of the mA and/or kV according to patient size and/or use of iterative reconstruction technique. COMPARISON:  CT abdomen and pelvis 04/06/2021 FINDINGS: Lower chest: No acute abnormality. Hepatobiliary: No focal liver abnormality is seen. Status post cholecystectomy. No biliary dilatation. Pancreas: Small low-attenuation/cystic lesions in the pancreas measuring 14 mm in the pancreatic head and 12 mm in the pancreatic tail appear unchanged. No acute inflammation. No ductal dilatation. Spleen: Normal in size without focal abnormality. Adrenals/Urinary Tract: No hydronephrosis or urinary tract calculus. Right renal cysts measuring up to 5.2 cm appear unchanged. Adrenal glands and bladder are within normal limits. Stomach/Bowel: Stomach is within normal limits. No evidence of bowel wall thickening, distention, or inflammatory changes. There is sigmoid colon diverticulosis without evidence for diverticulitis. The appendix is not seen. Vascular/Lymphatic: Aortic  atherosclerosis. No enlarged abdominal or pelvic lymph nodes. Reproductive: This area is not well evaluated secondary to streak artifact in the pelvis. Other: No abdominal wall hernia or abnormality. No abdominopelvic ascites. Musculoskeletal: The bones are osteopenic. Degenerative changes affect the spine. Bilateral hip arthroplasties are present. IMPRESSION: 1. No acute localizing process in the abdomen or pelvis. 2. Stable cystic lesions in the pancreas. 3. Sigmoid colon diverticulosis. 4. Stable renal cysts measuring up to 5.2 cm. 5.  Aortic Atherosclerosis (ICD10-I70.0). Electronically Signed   By: Ronney Asters M.D.   On: 04/17/2022 00:30   DG Elbow Complete Right  Result Date: 04/16/2022 CLINICAL DATA:  Fall EXAM: RIGHT ELBOW - COMPLETE 3+ VIEW COMPARISON:  None Available. FINDINGS: No fracture or dislocation is seen. The joint spaces are preserved. The visualized soft tissues are unremarkable. No displaced elbow joint fat pads to suggest an elbow joint effusion. IMPRESSION: Negative. Electronically Signed   By: Julian Hy M.D.   On: 04/16/2022 22:39   DG Wrist Complete Right  Result Date: 04/16/2022 CLINICAL DATA:  Fall EXAM: RIGHT WRIST - COMPLETE 3+ VIEW COMPARISON:  None Available. FINDINGS: No fracture or dislocation is seen. Mild degenerative  changes of the 1st carpometacarpal joint and along the radial aspect of the wrist. Negative ulnar variance.  Old ulnar styloid fracture. Visualized soft tissues are within normal limits. IMPRESSION: Negative. Electronically Signed   By: Julian Hy M.D.   On: 04/16/2022 22:43   CT HEAD WO CONTRAST  Result Date: 04/17/2022 CLINICAL DATA:  Fall. EXAM: CT HEAD WITHOUT CONTRAST CT CERVICAL SPINE WITHOUT CONTRAST TECHNIQUE: Multidetector CT imaging of the head and cervical spine was performed following the standard protocol without intravenous contrast. Multiplanar CT image reconstructions of the cervical spine were also generated. RADIATION  DOSE REDUCTION: This exam was performed according to the departmental dose-optimization program which includes automated exposure control, adjustment of the mA and/or kV according to patient size and/or use of iterative reconstruction technique. COMPARISON:  CT head 03/22/2022 FINDINGS: CT HEAD FINDINGS Brain: No evidence of acute infarction, hemorrhage, hydrocephalus, extra-axial collection or mass lesion/mass effect. Again seen is mild diffuse atrophy and mild periventricular white matter hypodensity, likely chronic small vessel ischemic change. Vascular: Atherosclerotic calcifications are present within the cavernous internal carotid arteries. Skull: Normal. Negative for fracture or focal lesion. Sinuses/Orbits: No acute finding. Other: None. CT CERVICAL SPINE FINDINGS Alignment: There is trace anterolisthesis at C3-C4 and 2 mm of anterolisthesis at C5-C6. Findings are favored as degenerative. Alignment is otherwise anatomic. Skull base and vertebrae: The bones are diffusely osteopenic. There is no acute fracture or focal osseous lesion identified. Soft tissues and spinal canal: No prevertebral fluid or swelling. No visible canal hematoma. Disc levels: There is intervertebral disc space narrowing and endplate osteophyte formation at C5-C6 and C6-C7 compatible with degenerative change. Diffuse facet arthropathy is seen bilaterally, right greater than left causing severe neural foraminal stenosis on the right at C3-C4. There is no severe central canal stenosis at any level. Upper chest: Negative. Other: None. IMPRESSION: 1.  No acute intracranial process. 2. No acute fracture or traumatic subluxation of the cervical spine. 3. Mild diffuse brain atrophy. Mild chronic small vessel ischemic change. 4. Multilevel degenerative changes of the cervical spine with grade 1 anterolisthesis at C3-C4 and C5-C6. Electronically Signed   By: Ronney Asters M.D.   On: 04/17/2022 00:37   CT CERVICAL SPINE WO CONTRAST  Result  Date: 04/17/2022 CLINICAL DATA:  Fall. EXAM: CT HEAD WITHOUT CONTRAST CT CERVICAL SPINE WITHOUT CONTRAST TECHNIQUE: Multidetector CT imaging of the head and cervical spine was performed following the standard protocol without intravenous contrast. Multiplanar CT image reconstructions of the cervical spine were also generated. RADIATION DOSE REDUCTION: This exam was performed according to the departmental dose-optimization program which includes automated exposure control, adjustment of the mA and/or kV according to patient size and/or use of iterative reconstruction technique. COMPARISON:  CT head 03/22/2022 FINDINGS: CT HEAD FINDINGS Brain: No evidence of acute infarction, hemorrhage, hydrocephalus, extra-axial collection or mass lesion/mass effect. Again seen is mild diffuse atrophy and mild periventricular white matter hypodensity, likely chronic small vessel ischemic change. Vascular: Atherosclerotic calcifications are present within the cavernous internal carotid arteries. Skull: Normal. Negative for fracture or focal lesion. Sinuses/Orbits: No acute finding. Other: None. CT CERVICAL SPINE FINDINGS Alignment: There is trace anterolisthesis at C3-C4 and 2 mm of anterolisthesis at C5-C6. Findings are favored as degenerative. Alignment is otherwise anatomic. Skull base and vertebrae: The bones are diffusely osteopenic. There is no acute fracture or focal osseous lesion identified. Soft tissues and spinal canal: No prevertebral fluid or swelling. No visible canal hematoma. Disc levels: There is intervertebral disc space narrowing and endplate  osteophyte formation at C5-C6 and C6-C7 compatible with degenerative change. Diffuse facet arthropathy is seen bilaterally, right greater than left causing severe neural foraminal stenosis on the right at C3-C4. There is no severe central canal stenosis at any level. Upper chest: Negative. Other: None. IMPRESSION: 1.  No acute intracranial process. 2. No acute fracture or  traumatic subluxation of the cervical spine. 3. Mild diffuse brain atrophy. Mild chronic small vessel ischemic change. 4. Multilevel degenerative changes of the cervical spine with grade 1 anterolisthesis at C3-C4 and C5-C6. Electronically Signed   By: Ronney Asters M.D.   On: 04/17/2022 00:37   DG Pelvis Portable  Result Date: 04/16/2022 CLINICAL DATA:  Fall EXAM: PORTABLE PELVIS 1-2 VIEWS COMPARISON:  None Available. FINDINGS: Bilateral hip arthroplasties. No fracture or dislocation is seen. Visualized bony pelvis appears intact. IMPRESSION: Negative. Electronically Signed   By: Julian Hy M.D.   On: 04/16/2022 22:42   DG Chest Port 1 View  Result Date: 04/16/2022 CLINICAL DATA:  Fall. EXAM: PORTABLE CHEST 1 VIEW COMPARISON:  Chest x-ray 04/15/2022 FINDINGS: The heart size and mediastinal contours are within normal limits. Both lungs are clear. The visualized skeletal structures are unremarkable. IMPRESSION: No active disease. Electronically Signed   By: Ronney Asters M.D.   On: 04/16/2022 22:39   DG Chest Port 1 View  Result Date: 04/15/2022 CLINICAL DATA:  SHOB EXAM: PORTABLE CHEST 1 VIEW COMPARISON:  May 6, 23 FINDINGS: No consolidation. No visible pleural effusions or pneumothorax. Cardiomediastinal silhouette is within normal limits. No displaced fracture. IMPRESSION: No evidence of acute cardiopulmonary disease. Electronically Signed   By: Margaretha Sheffield M.D.   On: 04/15/2022 11:08   DG Knee Left Port  Result Date: 04/16/2022 CLINICAL DATA:  Fall EXAM: PORTABLE LEFT KNEE - 1-2 VIEW COMPARISON:  None Available. FINDINGS: No fracture or dislocation is seen. Mild degenerative changes of the patellofemoral joint. Visualized soft tissues are within normal limits. No suprapatellar knee joint effusion. IMPRESSION: Negative. Electronically Signed   By: Julian Hy M.D.   On: 04/16/2022 22:40   DG Knee Right Port  Result Date: 04/16/2022 CLINICAL DATA:  Fall EXAM: PORTABLE RIGHT  KNEE - 1-2 VIEW COMPARISON:  None Available. FINDINGS: No fracture or dislocation is seen. Mild tricompartmental degenerative changes. Mild soft tissue swelling along the anterior aspect of the proximal tibia. No suprapatellar knee joint effusion. IMPRESSION: No fracture or dislocation is seen. Mild soft tissue swelling along the anterior aspect of the proximal tibia. Electronically Signed   By: Julian Hy M.D.   On: 04/16/2022 22:41   DG Tibia/Fibula Right Port  Result Date: 04/16/2022 CLINICAL DATA:  Fall EXAM: PORTABLE RIGHT TIBIA AND FIBULA - 2 VIEW COMPARISON:  None Available. FINDINGS: No fracture or dislocation is seen. The visualized soft tissues are unremarkable. IMPRESSION: Negative. Electronically Signed   By: Julian Hy M.D.   On: 04/16/2022 22:42   DG Ankle Right Port  Result Date: 04/16/2022 CLINICAL DATA:  Fall EXAM: PORTABLE RIGHT ANKLE - 2 VIEW COMPARISON:  None Available. FINDINGS: No fracture or dislocation is seen. The ankle mortise is intact. The base of the fifth metatarsal is unremarkable. Visualized soft tissues are within normal limits. IMPRESSION: Negative. Electronically Signed   By: Julian Hy M.D.   On: 04/16/2022 22:39    EKG: My personal interpretation of EKG shows: NSR, IVCD     Assessment/Plan Principal Problem:   Acute renal failure superimposed on stage 3a chronic kidney disease (HCC) Active Problems:  Hyperkalemia   Type 2 diabetes mellitus with diabetic neuropathy, without long-term current use of insulin (HCC)   Essential hypertension   Atrial fibrillation (HCC)   GERD   Chronic combined systolic and diastolic congestive heart failure (HCC)   Stage 3a chronic kidney disease (CKD) (HCC) - baseline SCr 1.25-1.5   Hypothyroid   DNR (do not resuscitate)/DNI(Do No Intubate)    Assessment and Plan: * Acute renal failure superimposed on stage 3a chronic kidney disease ([place]) Admit to observation MedSurg bed.  Likely precipitated by  diarrhea from laxatives that were treating her constipation.  Patient given 1 L of IV fluids.  Hold on further IV fluids given her history of systolic heart failure with an EF of 30%.  Repeat BMP in the morning.  Hold all nephrotoxic agents.  Acute renal failure superimposed on stage 3a chronic kidney disease (HCC) is a Acute on Chronic illness/condition that poses a threat to life or bodily function.   Hyperkalemia Acute.  Likely due to dehydration from diarrhea, concomitant use of Aldactone and losartan.  Patient given a dose of IV insulin, IV calcium gluconate.  Also given p.o. dose of Lokelma.  Repeat BMP in in the morning.  DNR (do not resuscitate)/DNI(Do No Intubate) Chronic.  Followed by palliative care as outpatient.  See their note dated 04/12/2022.  DNR status established then.  Hypothyroid Stable.  Continue Synthroid 25 mcg daily.  Stage 3a chronic kidney disease (CKD) (HCC) - baseline SCr 1.25-1.5 Acutely exacerbated due to dehydration from diarrhea.  Baseline creatinine 1.25-1.5.  Repeat BMP in the morning.  Chronic combined systolic and diastolic congestive heart failure (HCC) Stable.  Last known EF of 30 to 35% based on echo May 2022.  Holding Cozaar, Aldactone, torsemide due to acute kidney injury.  Continue with Coreg 6.25 mg twice daily.  GERD Stable.  Continue Protonix 40 mg twice daily.  Atrial fibrillation (HCC) Stable.  Continue on aspirin 81 mg.  Continue on Coreg 6.25 mg twice daily.  Not on anticoagulation due to history of falls  Essential hypertension Chronic.  Continue Coreg 6.25 mg.  Hold Cozaar 25 mg due to acute kidney injury.  Hold Aldactone 12.5 mg and Demadex 10 mg due to acute kidney injury.  Type 2 diabetes mellitus with diabetic neuropathy, without long-term current use of insulin (HCC) Chronic.  Check A1c.  Add sliding scale insulin.    DVT prophylaxis: SQ Heparin Code Status: DNR/DNI(Do NOT Intubate) Family Communication: no family at bedside   Disposition Plan: return to ALF([facility])  Consults called: none  Admission status: Observation, Med-Surg   Kristopher Oppenheim, DO [facility] 04/17/2022, 1:26 AM

## 2022-04-17 NOTE — Assessment & Plan Note (Signed)
Chronic.  Check A1c.  Add sliding scale insulin. 

## 2022-04-17 NOTE — Assessment & Plan Note (Signed)
Stable.  Last known EF of 30 to 35% based on echo May 2022.  Holding Cozaar, Aldactone, torsemide due to acute kidney injury.  Continue with Coreg 6.25 mg twice daily.

## 2022-04-17 NOTE — Assessment & Plan Note (Signed)
Acute.  Likely due to dehydration from diarrhea, concomitant use of Aldactone and losartan.  Patient given a dose of IV insulin, IV calcium gluconate.  Also given p.o. dose of Lokelma.  Repeat BMP in in the morning.

## 2022-04-17 NOTE — ED Notes (Signed)
Patient transported to CT 

## 2022-04-17 NOTE — Assessment & Plan Note (Signed)
Chronic.  Continue Coreg 6.25 mg.  Hold Cozaar 25 mg due to acute kidney injury.  Hold Aldactone 12.5 mg and Demadex 10 mg due to acute kidney injury.

## 2022-04-17 NOTE — NC FL2 (Signed)
[place] LEVEL OF CARE SCREENING TOOL     IDENTIFICATION  Patient Name: [name] Birthdate: [birth year] Sex: female Admission Date (Current Location): 04/16/2022  [facility] and [place] Number:  Herbalist and Address:  [place]. [facility], [place] [address], [place], [place] [address]      Provider Number: [id number]  Attending Physician Name and Address:  Mendel Corning, MD  Relative Name and Phone Number:  Charlena Cross (Niece)   [phone]    Current Level of Care: Hospital Recommended Level of Care: [place] Prior Approval Number:    Date Approved/Denied:   PASRR Number: [id number]  Discharge Plan:      Current Diagnoses: Patient Active Problem List   Diagnosis Date Noted   Acute renal failure superimposed on stage 3a chronic kidney disease ([place]) 04/17/2022   DNR (do not resuscitate)/DNI(Do No Intubate) 04/17/2022   Hyperkalemia 04/17/2022   Microcytic anemia 04/07/2021   Iron deficiency anemia 04/07/2021   Stage 3a chronic kidney disease (CKD) ([place]) - baseline SCr 1.25-1.5 04/07/2021   Pancreas cyst 04/07/2021   Hypothyroid 04/07/2021   Palliative care encounter    Reactive airway disease 12/09/2017   Anxiety and depression 12/09/2017   Normocytic anemia 04/13/2016   Chronic combined systolic and diastolic congestive heart failure ([place]) 11/14/2013   Constipation 11/14/2013   Peripheral neuropathy [id number]   Chronic systolic HF (heart failure) ([place]) 05/04/2012   Arthritis 07/23/2011   DYSPNEA 07/31/2010   DEPRESSIVE DISORDER NOT ELSEWHERE CLASSIFIED 02/08/2009   Type 2 diabetes mellitus with diabetic neuropathy, without long-term current use of insulin ([place]) 11/08/2008   HLD (hyperlipidemia) 11/08/2008   Essential hypertension 11/08/2008   LBBB (left bundle branch block) 11/08/2008   Atrial fibrillation ([place]) 11/08/2008   GERD 11/08/2008   HEPATIC CYST 11/08/2008   CHOLELITHIASIS  11/08/2008    Orientation RESPIRATION BLADDER Height & Weight     Time, Situation, Place, Self  O2 Incontinent, External catheter Weight: 211 lb 10.3 oz (96 kg) Height:  6' (182.9 cm)  BEHAVIORAL SYMPTOMS/MOOD NEUROLOGICAL BOWEL NUTRITION STATUS      Continent Diet (See DC Summary)  AMBULATORY STATUS COMMUNICATION OF NEEDS Skin   Limited Assist Verbally Normal                       Personal Care Assistance Level of Assistance  Bathing, Dressing, Feeding Bathing Assistance: Limited assistance Feeding assistance: Independent Dressing Assistance: Limited assistance     Functional Limitations Info  Sight, Hearing, Speech Sight Info: Impaired Hearing Info: Impaired Speech Info: Adequate    SPECIAL CARE FACTORS FREQUENCY  PT (By licensed PT), OT (By licensed OT)     PT Frequency: 5x a week OT Frequency: 5x a week            Contractures Contractures Info: Not present    Additional Factors Info  Code Status, Allergies Code Status Info: DNR Allergies Info: Fosamax (Alendronate Sodium)   Penicillins   Sulfonamide Derivatives   Tetanus Toxoid           Current Medications (04/17/2022):  This is the current hospital active medication list Current Facility-Administered Medications  Medication Dose Route Frequency Provider Last Rate Last Admin   acetaminophen (TYLENOL) tablet 650 mg  650 mg Oral Q6H PRN Kristopher Oppenheim, DO       Or   acetaminophen (TYLENOL) suppository 650 mg  650 mg Rectal Q6H PRN Kristopher Oppenheim, DO  albuterol (PROVENTIL) (2.5 MG/3ML) 0.083% nebulizer solution 2.5 mg  2.5 mg Nebulization Q2H PRN Kristopher Oppenheim, DO   2.5 mg at 04/17/22 0357   ALPRAZolam Duanne Moron) tablet 1 mg  1 mg Oral TID PRN Kristopher Oppenheim, DO       aspirin chewable tablet 81 mg  81 mg Oral Daily Kristopher Oppenheim, DO   81 mg at 04/17/22 [id number]   buPROPion (WELLBUTRIN XL) 24 hr tablet 150 mg  150 mg Oral Daily Kristopher Oppenheim, DO   150 mg at 04/17/22 [id number]   carvedilol (COREG) tablet 6.25 mg  6.25 mg Oral BID  Kristopher Oppenheim, DO   6.25 mg at 04/17/22 [id number]   heparin injection 5,000 Units  5,000 Units Subcutaneous Q8H Kristopher Oppenheim, DO   5,000 Units at 04/17/22 1305   HYDROcodone-acetaminophen (NORCO/VICODIN) 5-325 MG per tablet 1 tablet  1 tablet Oral BID Kristopher Oppenheim, DO       insulin aspart (novoLOG) injection 0-5 Units  0-5 Units Subcutaneous QHS Kristopher Oppenheim, DO       insulin aspart (novoLOG) injection 0-9 Units  0-9 Units Subcutaneous TID WC Kristopher Oppenheim, DO   1 Units at 04/17/22 1236   levothyroxine (SYNTHROID) tablet 25 mcg  25 mcg Oral QAC breakfast Kristopher Oppenheim, DO   25 mcg at 04/17/22 0506   mirabegron ER (MYRBETRIQ) tablet 25 mg  25 mg Oral Daily Kristopher Oppenheim, DO   25 mg at 04/17/22 [id number]   mirtazapine (REMERON) tablet 30 mg  30 mg Oral QHS Kristopher Oppenheim, DO       ondansetron [facility]) tablet 4 mg  4 mg Oral Q6H PRN Kristopher Oppenheim, DO       Or   ondansetron [facility]) injection 4 mg  4 mg Intravenous Q6H PRN Kristopher Oppenheim, DO       pantoprazole (PROTONIX) EC tablet 40 mg  40 mg Oral BID Kristopher Oppenheim, DO   40 mg at 04/17/22 [id number]   sertraline (ZOLOFT) tablet 200 mg  200 mg Oral Daily Kristopher Oppenheim, DO   200 mg at 04/17/22 [id number]   simvastatin (ZOCOR) tablet 40 mg  40 mg Oral QHS Kristopher Oppenheim, DO         Discharge Medications: Please see discharge summary for a list of discharge medications.  Relevant Imaging Results:  Relevant Lab Results:   Additional Information SSN# [id number] pt has been vaccinated and one booster  Reece Agar, LCSWA

## 2022-04-17 NOTE — Subjective & Objective (Signed)
Chief complaint: [place] out of her wheelchair History of present illness: 88 year old female history of type 2 diabetes, chronic kidney disease stage III, hypertension, chronic systolic heart failure EF of 30 to 35%, coming from [facility] assisted living facility.  Patient states that she had a maintenance man in her living room working on her apartment.  He had pushed her wheelchair out of the way to perform his duties.  He failed to put the wheelchair back next to her when he left.  Patient states that she was try to use her walker to get to her wheelchair.  She fell when trying to get into a wheelchair.  Patient's been treated with laxatives last 2 days due to constipation.  She had massive amounts of diarrhea and EMS and on the way to the hospital.  Patient denies any chest pain, shortness of breath or nausea.   Arrival temp 99 heart rate 73 blood pressure 119/53  Labs:  Sodium 138, [place] 6.0, bicarb 25, BUN of 30, creatinine 2.6 After treatment with IV calcium, IV insulin, p.o. Lokelma, repeat potassium 3.8 Patient given a liter of IV fluids.  Repeat BUN of 29, creatinine 2.45.  White count of 15.3, hemato-point 9.  EKG which I personally reviewed and interpreted shows normal sinus rhythm with a IVCD.  First-degree AV block.  CT abdomen pelvis without contrast which I personally reviewed and interpreted shows no stool in the rectum.  No inflammatory changes.  CT head which I personally reviewed interpreted shows no acute intracranial abnormality.  CT C-spine which I personally viewed interpreted shows no acute fractures.  There was severe neuroforaminal stenosis at C3-4 right greater than left  Due to the patient's acute kidney injury and hyperkalemia, [facility] contacted for admission.

## 2022-04-17 NOTE — ED Provider Notes (Signed)
Blood pressure 137/60, pulse 77, temperature 99.1 F (37.3 C), temperature source Oral, resp. rate 20, height 6' (1.829 m), weight 96 kg, SpO2 95 %.  Assuming care from Dr. Armandina Gemma.  In short, [name] is a 88 y.o. female with a chief complaint of Fall .  Refer to the original H&P for additional details.  The current plan of care is to f/u on remaining imaging and admit.  12:24 AM CT head, c spine, and abdomen/pelvis independently evaluated and agree with radiology interpretation. Plan for admit for AKI and K+ shifting.   Discussed patient's case with TR[facility], Dr. Bridgett Larsson to request admission. Patient and family (if present) updated with plan.  I reviewed all nursing notes, vitals, pertinent old records, EKGs, labs, imaging (as available).    Margette Fast, MD 04/17/22 [phone]

## 2022-04-17 NOTE — Assessment & Plan Note (Signed)
Chronic.  Followed by palliative care as outpatient.  See their note dated 04/12/2022.  DNR status established then.

## 2022-04-17 NOTE — Assessment & Plan Note (Signed)
Stable.  Continue on aspirin 81 mg.  Continue on Coreg 6.25 mg twice daily.  Not on anticoagulation due to history of falls

## 2022-04-17 NOTE — Assessment & Plan Note (Signed)
Acutely exacerbated due to dehydration from diarrhea.  Baseline creatinine 1.25-1.5.  Repeat BMP in the morning.

## 2022-04-17 NOTE — TOC Progression Note (Signed)
Transition of Care [facility]) - Progression Note    Patient Details  Name: [name] MRN: [id number] Date of Birth: 1923-02-25  [facility] CM/SW Contact  Ivette Loyal, [place] Phone Number: 04/17/2022, 1:58 PM  Clinical Narrative:    CSW spoke with pt niece (POA) who is concerned about pt strength. Pt niece thinks it would be best if pt returned to [facility]) with [facility] PT. CSW informed pt niece that PT has not seen pt yet but they may recommend SNF. CSW will keep POA updated on PT decision.    Expected Discharge Plan: Assisted Living Barriers to Discharge: Continued Medical Work up  Expected Discharge Plan and Services Expected Discharge Plan: Assisted Living In-house Referral: Clinical Social Work     Living arrangements for the past 2 months: Assisted Living Facility                                       Social Determinants of Health (SDOH) Interventions    Readmission Risk Interventions     View : No data to display.

## 2022-04-17 NOTE — Assessment & Plan Note (Signed)
Admit to observation MedSurg bed.  Likely precipitated by diarrhea from laxatives that were treating her constipation.  Patient given 1 L of IV fluids.  Hold on further IV fluids given her history of systolic heart failure with an EF of 30%.  Repeat BMP in the morning.  Hold all nephrotoxic agents.  Acute renal failure superimposed on stage 3a chronic kidney disease (HCC) is a Acute on Chronic illness/condition that poses a threat to life or bodily function.

## 2022-04-18 DIAGNOSIS — N1831 Chronic kidney disease, stage 3a: Secondary | ICD-10-CM | POA: Diagnosis not present

## 2022-04-18 DIAGNOSIS — N170 Acute kidney failure with tubular necrosis: Secondary | ICD-10-CM | POA: Diagnosis not present

## 2022-04-18 DIAGNOSIS — I480 Paroxysmal atrial fibrillation: Secondary | ICD-10-CM | POA: Diagnosis not present

## 2022-04-18 DIAGNOSIS — N179 Acute kidney failure, unspecified: Secondary | ICD-10-CM | POA: Diagnosis not present

## 2022-04-18 DIAGNOSIS — I5042 Chronic combined systolic (congestive) and diastolic (congestive) heart failure: Secondary | ICD-10-CM | POA: Diagnosis not present

## 2022-04-18 LAB — BASIC METABOLIC PANEL
Anion gap: 6 (ref 5–15)
BUN: 26 mg/dL — ABNORMAL HIGH (ref 8–23)
CO2: 29 mmol/L (ref 22–32)
Calcium: 8.8 mg/dL — ABNORMAL LOW (ref 8.9–10.3)
Chloride: 103 mmol/L (ref 98–111)
Creatinine, Ser: 1.57 mg/dL — ABNORMAL HIGH (ref 0.44–1.00)
GFR, Estimated: 30 mL/min — ABNORMAL LOW (ref >60)
Glucose, Bld: 123 mg/dL — ABNORMAL HIGH (ref 70–99)
Potassium: 3.4 mmol/L — ABNORMAL LOW (ref 3.5–5.1)
Sodium: 138 mmol/L (ref 135–145)

## 2022-04-18 LAB — GLUCOSE, CAPILLARY
Glucose-Capillary: 115 mg/dL — ABNORMAL HIGH (ref 70–99)
Glucose-Capillary: 147 mg/dL — ABNORMAL HIGH (ref 70–99)
Glucose-Capillary: 169 mg/dL — ABNORMAL HIGH (ref 70–99)
Glucose-Capillary: 145 mg/dL — ABNORMAL HIGH (ref 70–99)

## 2022-04-18 LAB — CBC
HCT: 35.4 % — ABNORMAL LOW (ref 36.0–46.0)
Hemoglobin: 11.8 g/dL — ABNORMAL LOW (ref 12.0–15.0)
MCH: 30.5 pg (ref 26.0–34.0)
MCHC: 33.3 g/dL (ref 30.0–36.0)
MCV: 91.5 fL (ref 80.0–100.0)
Platelets: 134 10*3/uL — ABNORMAL LOW (ref 150–400)
RBC: 3.87 MIL/uL (ref 3.87–5.11)
RDW: 14 % (ref 11.5–15.5)
WBC: 8.8 10*3/uL (ref 4.0–10.5)
nRBC: 0 % (ref 0.0–0.2)

## 2022-04-18 NOTE — Evaluation (Signed)
Physical Therapy Evaluation Patient Details Name: [name] MRN: [id number] DOB: 03-Aug-1923 Today's Date: 04/18/2022  History of Present Illness  Patient is 88 y.o. female from [place] after a fall. Pt reports she was trying to use her walker to get to the wheelchair and fell when trying to get into the wheelchair. Imaging for LE, head, neck, and UE all negative for acute injury. PMH significant for Afib, DM, GERD, HLD, HTN, LBBB, Lt THA.    Clinical Impression  [name] is 88 y.o. female admitted with above HPI and diagnosis. Patient is currently limited by functional impairments below (see PT problem list). Patient lives at [facility] and has assist for ADL's at baseline and has been able to transfer bed<>wheelchair with RW until ~1-2 months ago. Currently pt requires Mod assist for bed mobility and Max +2 assist for transfers with RW. Patient will benefit from continued skilled PT interventions to address impairments and progress independence with mobility, recommending SNF for ST rehab prior to return to ALF vs return to ALF with pt getting 24/7 home aid/caregiver. Acute PT will follow and progress as able.        Recommendations for follow up therapy are one component of a multi-disciplinary discharge planning process, led by the attending physician.  Recommendations may be updated based on patient status, additional functional criteria and insurance authorization.  Follow Up Recommendations Skilled nursing-short term rehab (<3 hours/day)    Assistance Recommended at Discharge Frequent or constant Supervision/Assistance  Patient can return home with the following  Two people to help with walking and/or transfers;Two people to help with bathing/dressing/bathroom;Direct supervision/assist for medications management;Assist for transportation;Help with stairs or ramp for entrance;Assistance with cooking/housework    Equipment Recommendations None recommended  by PT  Recommendations for Other Services       Functional Status Assessment Patient has had a recent decline in their functional status and/or demonstrates limited ability to make significant improvements in function in a reasonable and predictable amount of time     Precautions / Restrictions Precautions Precautions: Fall Restrictions Weight Bearing Restrictions: No      Mobility  Bed Mobility Overal bed mobility: Needs Assistance Bed Mobility: Supine to Sit     Supine to sit: Mod assist, HOB elevated     General bed mobility comments: Mod Assist with multimodal cues to sequence walking LE's off EOB, reach for bed rail, and raise trunk upright.    Transfers Overall transfer level: Needs assistance Equipment used: Rolling walker (2 wheels) Transfers: Sit to/from Stand, Bed to chair/wheelchair/BSC Sit to Stand: From elevated surface, Max assist, +2 safety/equipment   Step pivot transfers: Max assist, +2 safety/equipment, From elevated surface, +2 physical assistance       General transfer comment: Max +2 assist for safety to rise from elevated EOB. Pt completed 3x with bil LE's buckling immediately on initial stand. Pt able to take small side steps along EOB after 2nd stand and completed stand step transfer to recliner on final stand. Max Assist to maintain balance, guide walker, and assist with weight shift to Rt/Lt LE with stepping.    Ambulation/Gait                  Stairs            Wheelchair Mobility    Modified Rankin (Stroke Patients Only)       Balance Overall balance assessment: History of Falls, Needs assistance Sitting-balance support: Feet supported, Bilateral upper extremity  supported Sitting balance-Leahy Scale: Poor     Standing balance support: Reliant on assistive device for balance, During functional activity, Bilateral upper extremity supported Standing balance-Leahy Scale: Poor                                Pertinent Vitals/Pain Pain Assessment Pain Assessment: No/denies pain    Home Living Family/patient expects to be discharged to:: Assisted living                 Home Equipment: Conservation officer, nature (2 wheels);Cane - single point;BSC/3in1;Shower seat;Grab bars - tub/shower;Hand held shower head;Wheelchair - manual      Prior Function Prior Level of Function : Needs assist       Physical Assist : Mobility (physical);ADLs (physical) Mobility (physical): Transfers;Gait;Bed mobility ADLs (physical): Grooming;Bathing;Dressing;Toileting;IADLs Mobility Comments: Pt has declined over the last 2 months and required increased assist. Now requires assist to transfer ebd<>wc<>chair and has primarily been mobilizing with WC propelling self with feet. ADLs Comments: caregivers/aids at ALF provide assist for bathing, dressing, self care. pt states they will deliver meals to room for $5/meal.     Hand Dominance   Dominant Hand: Right    Extremity/Trunk Assessment   Upper Extremity Assessment Upper Extremity Assessment: Generalized weakness;Defer to OT evaluation    Lower Extremity Assessment Lower Extremity Assessment: Generalized weakness;LLE deficits/detail;RLE deficits/detail RLE Deficits / Details: 3-/5 grossly for hip flexion, knee extension, dorsi/plantar flexion LLE Deficits / Details: 2/5 grossly for hip flexion, knee extension, dorsi/plantar flexion    Cervical / Trunk Assessment Cervical / Trunk Assessment: Kyphotic  Communication   Communication: HOH  Cognition Arousal/Alertness: Awake/alert Behavior During Therapy: WFL for tasks assessed/performed Overall Cognitive Status: Within Functional Limits for tasks assessed                                          General Comments      Exercises     Assessment/Plan    PT Assessment Patient needs continued PT services  PT Problem List Decreased strength;Decreased activity tolerance;Decreased  balance;Decreased mobility;Decreased knowledge of use of DME;Decreased safety awareness;Decreased knowledge of precautions       PT Treatment Interventions DME instruction;Gait training;Stair training;Therapeutic activities;Therapeutic exercise;Functional mobility training;Balance training;Neuromuscular re-education;Wheelchair mobility training;Patient/family education;Cognitive remediation    PT Goals (Current goals can be found in the Care Plan section)  Acute Rehab PT Goals Patient Stated Goal: get back to ALF PT Goal Formulation: With patient Time For Goal Achievement: 05/02/22 Potential to Achieve Goals: Fair    Frequency Min 2X/week     Co-evaluation               AM-PAC PT "6 Clicks" Mobility  Outcome Measure Help needed turning from your back to your side while in a flat bed without using bedrails?: A Lot Help needed moving from lying on your back to sitting on the side of a flat bed without using bedrails?: A Lot Help needed moving to and from a bed to a chair (including a wheelchair)?: Total Help needed standing up from a chair using your arms (e.g., wheelchair or bedside chair)?: Total Help needed to walk in hospital room?: Total Help needed climbing 3-5 steps with a railing? : Total 6 Click Score: 8    End of Session Equipment Utilized During Treatment: Gait belt Activity Tolerance: Patient tolerated  treatment well Patient left: in chair;with call bell/phone within reach;with chair alarm set;with family/visitor present (elevated chair) Nurse Communication: Mobility status (2 person assist) PT Visit Diagnosis: Unsteadiness on feet (R26.81);Other abnormalities of gait and mobility (R26.89);Muscle weakness (generalized) (M62.81);Difficulty in walking, not elsewhere classified (R26.2);History of falling (Z91.81)    Time: [id number] PT Time Calculation (min) (ACUTE ONLY): 41 min   Charges:   PT Evaluation $PT Eval Moderate Complexity: 1 Mod PT  Treatments $Therapeutic Activity: 23-37 mins        Verner Mould, DPT Acute Rehabilitation Services Office [phone] Pager [phone]  04/18/22 4:36 PM

## 2022-04-18 NOTE — TOC Initial Note (Signed)
Transition of Care [facility]) - Initial/Assessment Note    Patient Details  Name: [name] MRN: [id number] Date of Birth: 11/30/1922  Transition of Care [facility]) CM/SW Contact:    Tresa Endo Phone Number: 04/18/2022, 2:36 PM  Clinical Narrative:                 Pt is from [facility] and would like to return with [facility] PT. CSW suggested waiting on PT here to mobilize and determine DC need before discharging. Pt is willing to go to SNF if needed. Pt stated that she was independent at her ALF apartment bc she was able to use her walker or wheelchair depending on her daily ability. CSW will continue to follow for DC planning.  Expected Discharge Plan: Assisted Living Barriers to Discharge: Continued Medical Work up   Patient Goals and CMS Choice Patient states their goals for this hospitalization and ongoing recovery are:: Rehab either at ALF or SNF CMS Medicare.gov Compare Post Acute Care list provided to:: Patient Choice offered to / list presented to : Patient  Expected Discharge Plan and Services Expected Discharge Plan: Assisted Living In-house Referral: Clinical Social Work   Post Acute Care Choice:  (ALF) Living arrangements for the past 2 months: [place]                                      Prior Living Arrangements/Services Living arrangements for the past 2 months: [place] Lives with:: Facility Resident   Do you feel safe going back to the place where you live?: Yes      Need for Family Participation in Patient Care: Yes (Comment) Care giver support system in place?: Yes (comment) Current home services: DME Criminal Activity/Legal Involvement Pertinent to Current Situation/Hospitalization: No - Comment as needed  Activities of Daily Living Home Assistive Devices/Equipment: Gilford Rile (specify type) ADL Screening (condition at time of admission) Patient's cognitive ability adequate to safely complete daily  activities?: Yes Is the patient deaf or have difficulty hearing?: Yes Does the patient have difficulty seeing, even when wearing glasses/contacts?: No Does the patient have difficulty concentrating, remembering, or making decisions?: No Patient able to express need for assistance with ADLs?: Yes Does the patient have difficulty dressing or bathing?: Yes Independently performs ADLs?: No Communication: Independent Dressing (OT): Needs assistance Is this a change from baseline?: Pre-admission baseline Grooming: Needs assistance Is this a change from baseline?: Pre-admission baseline Feeding: Independent Bathing: Needs assistance Is this a change from baseline?: Pre-admission baseline Toileting: Needs assistance Is this a change from baseline?: Pre-admission baseline In/Out Bed: Needs assistance Is this a change from baseline?: Pre-admission baseline Walks in Home: Needs assistance Is this a change from baseline?: Pre-admission baseline Does the patient have difficulty walking or climbing stairs?: Yes Weakness of Legs: Both Weakness of Arms/Hands: Both  Permission Sought/Granted Permission sought to share information with : Facility Sport and exercise psychologist, Family Supports Permission granted to share information with : Yes, Verbal Permission Granted  Share Information with NAME: Charlena Cross (Niece)   [phone]  Permission granted to share info w AGENCY: ALF  Permission granted to share info w Relationship: Charlena Cross (Niece)   [phone]  Permission granted to share info w Contact Information: Charlena Cross (Niece)   [phone]  Emotional Assessment Appearance:: Appears stated age Attitude/Demeanor/Rapport: Engaged Affect (typically observed): Accepting, Adaptable Orientation: : Oriented to Self, Oriented to Place, Oriented to  Time,  Oriented to Situation Alcohol / Substance Use: Not Applicable Psych Involvement: No (comment)  Admission diagnosis:  Hyperkalemia  [E87.5] Lactic acidosis [E87.20] AKI (acute kidney injury) ([place]) [N17.9] Fall, initial encounter [W19.XXXA] Diarrhea, unspecified type [R19.7] Acute renal failure superimposed on stage 3 chronic kidney disease, unspecified acute renal failure type, unspecified whether stage 3a or 3b CKD ([place]) [N17.9, N18.30] Patient Active Problem List   Diagnosis Date Noted   Acute renal failure superimposed on stage 3a chronic kidney disease ([place]) 04/17/2022   DNR (do not resuscitate)/DNI(Do No Intubate) 04/17/2022   Hyperkalemia 04/17/2022   Microcytic anemia 04/07/2021   Iron deficiency anemia 04/07/2021   Stage 3a chronic kidney disease (CKD) ([place]) - baseline SCr 1.25-1.5 04/07/2021   Pancreas cyst 04/07/2021   Hypothyroid 04/07/2021   Palliative care encounter    Reactive airway disease 12/09/2017   Anxiety and depression 12/09/2017   Normocytic anemia 04/13/2016   Chronic combined systolic and diastolic congestive heart failure ([place]) 11/14/2013   Constipation 11/14/2013   Peripheral neuropathy [id number]   Chronic systolic HF (heart failure) ([place]) 05/04/2012   Arthritis 07/23/2011   DYSPNEA 07/31/2010   DEPRESSIVE DISORDER NOT ELSEWHERE CLASSIFIED 02/08/2009   Type 2 diabetes mellitus with diabetic neuropathy, without long-term current use of insulin ([place]) 11/08/2008   HLD (hyperlipidemia) 11/08/2008   Essential hypertension 11/08/2008   LBBB (left bundle branch block) 11/08/2008   Atrial fibrillation ([place]) 11/08/2008   GERD 11/08/2008   HEPATIC CYST 11/08/2008   CHOLELITHIASIS 11/08/2008   PCP:  Mayra Neer, MD Pharmacy:   [facility] - [place], [place] [address] Phone: [phone] Fax: [place], [place] - [address] AT [facility] OF [address] [place] [address] Phone: [phone] Fax: [phone]     Social  Determinants of Health (SDOH) Interventions    Readmission Risk Interventions     View : No data to display.

## 2022-04-18 NOTE — Progress Notes (Signed)
[facility]                                                                              [name], is a 88 y.o. female, DOB - 1923/07/26, [id number] Admit date - 04/16/2022    Outpatient Primary MD for the patient is Mayra Neer, MD  LOS - 0  days  Chief Complaint  Patient presents with   Fall       Brief summary   Patient is a 88 year old female with history of DM, CKD stage III, HTN, chronic systolic CHF EF of 30 to [id number] presented from [facility], ALF with a fall.  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  He had pushed her wheelchair out of the way to perform his duties.  He failed to put the wheelchair back next to her when he left.  Patient ported that she was trying to use her walker to get to the wheelchair and fell when trying to get into the wheelchair. Patient had been treated with laxatives for the last 2 days due to constipation and subsequently had massive amounts of diarrhea. Creatinine 2.6, BUN 30, potassium 6.0 Patient was admitted for further work-up.    Assessment & Plan    Principal Problem:   Acute renal failure superimposed on stage 3a chronic kidney disease ([place]) -Likely due to significant diarrhea from laxatives -Presented with creatinine of 2.6, plateaued at 2.8, baseline creatinine 1.2-1.5 - has history of chronic systolic CHF, 30 to [id number], losartan, torsemide and Aldactone currently on hold -Creatinine now improved to 1.5, back to baseline, DC IV fluids  Active Problems:   Hyperkalemia -Potassium 7.1 on admission - resolved, now 3.4    Type 2 diabetes mellitus with diabetic neuropathy, without long-term current use of insulin (HCC) -Hemoglobin A1c 6.3, continue sliding scale insulin -CBG stable     Essential hypertension -Continue Coreg, BP stable -Renal function has improved, CHF compensated, gradually resume torsemide in a.m. -Continue to hold Aldactone and  Cozaar     Atrial fibrillation (HCC) -Currently rate controlled, continue Coreg -Not on anticoagulation due to history of falls    GERD -Continue PPI    Chronic combined systolic and diastolic congestive heart failure ([place]) -2D echo May/2022 showed EF of 30 to 35% -Currently compensated, holding torsemide, Aldactone due to acute kidney injury     Hypothyroid -Continue Synthroid  Generalized debility -Feels generalized weakness, debility, currently in ALF PT evaluation recommending SNF.   Code Status: DNR/DNI DVT Prophylaxis:  heparin injection 5,000 Units Start: 04/17/22 0600 SCDs Start: 04/17/22 0236   Level of Care: Level of care: Med-Surg Family Communication: Updated patient   Disposition Plan:      Remains inpatient appropriate: PT evaluation recommended SNF, TOC consulted.   Procedures:  None  Consultants:   None  Antimicrobials:  None   Medications  aspirin  81 mg Oral Daily   buPROPion  150 mg Oral Daily   carvedilol  6.25 mg Oral BID   heparin  5,000 Units Subcutaneous Q8H   HYDROcodone-acetaminophen  1 tablet Oral BID   insulin aspart  0-5 Units Subcutaneous QHS   insulin aspart  0-9 Units Subcutaneous TID WC   levothyroxine  25 mcg Oral QAC breakfast   mirabegron ER  25 mg Oral Daily   mirtazapine  30 mg Oral QHS   pantoprazole  40 mg Oral BID   sertraline  200 mg Oral Daily   simvastatin  40 mg Oral QHS      Subjective:   Neola Dockweiler was seen and examined today.  Feels generalized weakness, states legs giveaway during ambulation.  No fevers, nausea or vomiting or abdominal pain..    Objective:   Vitals:   04/18/22 0500 04/18/22 0748 04/18/22 1227 04/18/22 1540  BP: 136/60 (!) 155/62 131/62 118/62  Pulse: (!) 59 60 71 69  Resp: 17  17 18   Temp: 98.2 F (36.8 C) 98 F (36.7 C) 97.8 F (36.6 C) 98.2 F (36.8 C)  TempSrc: Oral Oral Oral Oral  SpO2: 98% 100% 93% 97%  Weight: 97.8 kg     Height:        Intake/Output  Summary (Last 24 hours) at 04/18/2022 1646 Last data filed at 04/18/2022 0930 Gross per 24 hour  Intake 120 ml  Output 1600 ml  Net -1480 ml     Wt Readings from Last 3 Encounters:  04/18/22 97.8 kg  04/15/22 96 kg  03/22/22 96 kg   Physical Exam General: Alert and oriented x 3, NAD, ill-appearing Cardiovascular: S1 S2 clear, RRR.  Respiratory: CTAB, no wheezing, Gastrointestinal: Soft, nontender, nondistended, NBS Ext: no pedal edema bilaterally Neuro: no new deficits Psych: Normal affect and demeanor, alert and oriented x3     Data Reviewed:  I have personally reviewed following labs    CBC Lab Results  Component Value Date   WBC 8.8 04/18/2022   RBC 3.87 04/18/2022   HGB 11.8 (L) 04/18/2022   HCT 35.4 (L) 04/18/2022   MCV 91.5 04/18/2022   MCH 30.5 04/18/2022   PLT 134 (L) 04/18/2022   MCHC 33.3 04/18/2022   RDW 14.0 04/18/2022   LYMPHSABS 1.7 04/17/2022   MONOABS 1.0 04/17/2022   EOSABS 0.1 04/17/2022   BASOSABS 0.0 [id number]     Last metabolic panel Lab Results  Component Value Date   NA 138 04/18/2022   K 3.4 (L) 04/18/2022   CL 103 04/18/2022   CO2 29 04/18/2022   BUN 26 (H) 04/18/2022   CREATININE 1.57 (H) 04/18/2022   GLUCOSE 123 (H) 04/18/2022   GFRNONAA 30 (L) 04/18/2022   GFRAA 37 (L) 07/02/2019   CALCIUM 8.8 (L) 04/18/2022   PROT 6.3 (L) 04/17/2022   ALBUMIN 3.3 (L) 04/17/2022   BILITOT 0.4 04/17/2022   ALKPHOS 58 04/17/2022   AST 69 (H) 04/17/2022   ALT 28 04/17/2022   ANIONGAP 6 04/18/2022    CBG (last 3)  Recent Labs    04/17/22 2023 04/18/22 0746 04/18/22 1159  GLUCAP 127* 115* 147*      Coagulation Profile: Recent Labs  Lab 04/16/22 2237  INR 1.2     Radiology Studies: I have personally reviewed the imaging studies  CT ABDOMEN PELVIS WO CONTRAST  Result Date: 04/17/2022 CLINICAL DATA:  Acute abdominal pain, abdominal trauma. EXAM: CT ABDOMEN AND PELVIS WITHOUT CONTRAST TECHNIQUE: Multidetector CT imaging of the  abdomen and pelvis was performed following the standard protocol without IV contrast. RADIATION DOSE REDUCTION: This exam was performed according to the departmental dose-optimization program which includes automated exposure control, adjustment of the mA and/or kV according to patient  size and/or use of iterative reconstruction technique. COMPARISON:  CT abdomen and pelvis 04/06/2021 FINDINGS: Lower chest: No acute abnormality. Hepatobiliary: No focal liver abnormality is seen. Status post cholecystectomy. No biliary dilatation. Pancreas: Small low-attenuation/cystic lesions in the pancreas measuring 14 mm in the pancreatic head and 12 mm in the pancreatic tail appear unchanged. No acute inflammation. No ductal dilatation. Spleen: Normal in size without focal abnormality. Adrenals/Urinary Tract: No hydronephrosis or urinary tract calculus. Right renal cysts measuring up to 5.2 cm appear unchanged. Adrenal glands and bladder are within normal limits. Stomach/Bowel: Stomach is within normal limits. No evidence of bowel wall thickening, distention, or inflammatory changes. There is sigmoid colon diverticulosis without evidence for diverticulitis. The appendix is not seen. Vascular/Lymphatic: Aortic atherosclerosis. No enlarged abdominal or pelvic lymph nodes. Reproductive: This area is not well evaluated secondary to streak artifact in the pelvis. Other: No abdominal wall hernia or abnormality. No abdominopelvic ascites. Musculoskeletal: The bones are osteopenic. Degenerative changes affect the spine. Bilateral hip arthroplasties are present. IMPRESSION: 1. No acute localizing process in the abdomen or pelvis. 2. Stable cystic lesions in the pancreas. 3. Sigmoid colon diverticulosis. 4. Stable renal cysts measuring up to 5.2 cm. 5.  Aortic Atherosclerosis (ICD10-I70.0). Electronically Signed   By: Ronney Asters M.D.   On: 04/17/2022 00:30   DG Elbow Complete Right  Result Date: 04/16/2022 CLINICAL DATA:  Fall  EXAM: RIGHT ELBOW - COMPLETE 3+ VIEW COMPARISON:  None Available. FINDINGS: No fracture or dislocation is seen. The joint spaces are preserved. The visualized soft tissues are unremarkable. No displaced elbow joint fat pads to suggest an elbow joint effusion. IMPRESSION: Negative. Electronically Signed   By: Julian Hy M.D.   On: 04/16/2022 22:39   DG Wrist Complete Right  Result Date: 04/16/2022 CLINICAL DATA:  Fall EXAM: RIGHT WRIST - COMPLETE 3+ VIEW COMPARISON:  None Available. FINDINGS: No fracture or dislocation is seen. Mild degenerative changes of the 1st carpometacarpal joint and along the radial aspect of the wrist. Negative ulnar variance.  Old ulnar styloid fracture. Visualized soft tissues are within normal limits. IMPRESSION: Negative. Electronically Signed   By: Julian Hy M.D.   On: 04/16/2022 22:43   CT HEAD WO CONTRAST  Result Date: 04/17/2022 CLINICAL DATA:  Fall. EXAM: CT HEAD WITHOUT CONTRAST CT CERVICAL SPINE WITHOUT CONTRAST TECHNIQUE: Multidetector CT imaging of the head and cervical spine was performed following the standard protocol without intravenous contrast. Multiplanar CT image reconstructions of the cervical spine were also generated. RADIATION DOSE REDUCTION: This exam was performed according to the departmental dose-optimization program which includes automated exposure control, adjustment of the mA and/or kV according to patient size and/or use of iterative reconstruction technique. COMPARISON:  CT head 03/22/2022 FINDINGS: CT HEAD FINDINGS Brain: No evidence of acute infarction, hemorrhage, hydrocephalus, extra-axial collection or mass lesion/mass effect. Again seen is mild diffuse atrophy and mild periventricular white matter hypodensity, likely chronic small vessel ischemic change. Vascular: Atherosclerotic calcifications are present within the cavernous internal carotid arteries. Skull: Normal. Negative for fracture or focal lesion. Sinuses/Orbits: No  acute finding. Other: None. CT CERVICAL SPINE FINDINGS Alignment: There is trace anterolisthesis at C3-C4 and 2 mm of anterolisthesis at C5-C6. Findings are favored as degenerative. Alignment is otherwise anatomic. Skull base and vertebrae: The bones are diffusely osteopenic. There is no acute fracture or focal osseous lesion identified. Soft tissues and spinal canal: No prevertebral fluid or swelling. No visible canal hematoma. Disc levels: There is intervertebral disc space narrowing and endplate  osteophyte formation at C5-C6 and C6-C7 compatible with degenerative change. Diffuse facet arthropathy is seen bilaterally, right greater than left causing severe neural foraminal stenosis on the right at C3-C4. There is no severe central canal stenosis at any level. Upper chest: Negative. Other: None. IMPRESSION: 1.  No acute intracranial process. 2. No acute fracture or traumatic subluxation of the cervical spine. 3. Mild diffuse brain atrophy. Mild chronic small vessel ischemic change. 4. Multilevel degenerative changes of the cervical spine with grade 1 anterolisthesis at C3-C4 and C5-C6. Electronically Signed   By: Ronney Asters M.D.   On: 04/17/2022 00:37   CT CERVICAL SPINE WO CONTRAST  Result Date: 04/17/2022 CLINICAL DATA:  Fall. EXAM: CT HEAD WITHOUT CONTRAST CT CERVICAL SPINE WITHOUT CONTRAST TECHNIQUE: Multidetector CT imaging of the head and cervical spine was performed following the standard protocol without intravenous contrast. Multiplanar CT image reconstructions of the cervical spine were also generated. RADIATION DOSE REDUCTION: This exam was performed according to the departmental dose-optimization program which includes automated exposure control, adjustment of the mA and/or kV according to patient size and/or use of iterative reconstruction technique. COMPARISON:  CT head 03/22/2022 FINDINGS: CT HEAD FINDINGS Brain: No evidence of acute infarction, hemorrhage, hydrocephalus, extra-axial  collection or mass lesion/mass effect. Again seen is mild diffuse atrophy and mild periventricular white matter hypodensity, likely chronic small vessel ischemic change. Vascular: Atherosclerotic calcifications are present within the cavernous internal carotid arteries. Skull: Normal. Negative for fracture or focal lesion. Sinuses/Orbits: No acute finding. Other: None. CT CERVICAL SPINE FINDINGS Alignment: There is trace anterolisthesis at C3-C4 and 2 mm of anterolisthesis at C5-C6. Findings are favored as degenerative. Alignment is otherwise anatomic. Skull base and vertebrae: The bones are diffusely osteopenic. There is no acute fracture or focal osseous lesion identified. Soft tissues and spinal canal: No prevertebral fluid or swelling. No visible canal hematoma. Disc levels: There is intervertebral disc space narrowing and endplate osteophyte formation at C5-C6 and C6-C7 compatible with degenerative change. Diffuse facet arthropathy is seen bilaterally, right greater than left causing severe neural foraminal stenosis on the right at C3-C4. There is no severe central canal stenosis at any level. Upper chest: Negative. Other: None. IMPRESSION: 1.  No acute intracranial process. 2. No acute fracture or traumatic subluxation of the cervical spine. 3. Mild diffuse brain atrophy. Mild chronic small vessel ischemic change. 4. Multilevel degenerative changes of the cervical spine with grade 1 anterolisthesis at C3-C4 and C5-C6. Electronically Signed   By: Ronney Asters M.D.   On: 04/17/2022 00:37   DG Pelvis Portable  Result Date: 04/16/2022 CLINICAL DATA:  Fall EXAM: PORTABLE PELVIS 1-2 VIEWS COMPARISON:  None Available. FINDINGS: Bilateral hip arthroplasties. No fracture or dislocation is seen. Visualized bony pelvis appears intact. IMPRESSION: Negative. Electronically Signed   By: Julian Hy M.D.   On: 04/16/2022 22:42   DG Chest Port 1 View  Result Date: 04/16/2022 CLINICAL DATA:  Fall. EXAM: PORTABLE  CHEST 1 VIEW COMPARISON:  Chest x-ray 04/15/2022 FINDINGS: The heart size and mediastinal contours are within normal limits. Both lungs are clear. The visualized skeletal structures are unremarkable. IMPRESSION: No active disease. Electronically Signed   By: Ronney Asters M.D.   On: 04/16/2022 22:39   DG Knee Left Port  Result Date: 04/16/2022 CLINICAL DATA:  Fall EXAM: PORTABLE LEFT KNEE - 1-2 VIEW COMPARISON:  None Available. FINDINGS: No fracture or dislocation is seen. Mild degenerative changes of the patellofemoral joint. Visualized soft tissues are within normal limits. No suprapatellar  knee joint effusion. IMPRESSION: Negative. Electronically Signed   By: Julian Hy M.D.   On: 04/16/2022 22:40   DG Knee Right Port  Result Date: 04/16/2022 CLINICAL DATA:  Fall EXAM: PORTABLE RIGHT KNEE - 1-2 VIEW COMPARISON:  None Available. FINDINGS: No fracture or dislocation is seen. Mild tricompartmental degenerative changes. Mild soft tissue swelling along the anterior aspect of the proximal tibia. No suprapatellar knee joint effusion. IMPRESSION: No fracture or dislocation is seen. Mild soft tissue swelling along the anterior aspect of the proximal tibia. Electronically Signed   By: Julian Hy M.D.   On: 04/16/2022 22:41   DG Tibia/Fibula Right Port  Result Date: 04/16/2022 CLINICAL DATA:  Fall EXAM: PORTABLE RIGHT TIBIA AND FIBULA - 2 VIEW COMPARISON:  None Available. FINDINGS: No fracture or dislocation is seen. The visualized soft tissues are unremarkable. IMPRESSION: Negative. Electronically Signed   By: Julian Hy M.D.   On: 04/16/2022 22:42   DG Ankle Right Port  Result Date: 04/16/2022 CLINICAL DATA:  Fall EXAM: PORTABLE RIGHT ANKLE - 2 VIEW COMPARISON:  None Available. FINDINGS: No fracture or dislocation is seen. The ankle mortise is intact. The base of the fifth metatarsal is unremarkable. Visualized soft tissues are within normal limits. IMPRESSION: Negative. Electronically  Signed   By: Julian Hy M.D.   On: 04/16/2022 22:39       Truxton Stupka M.D. [facility] 04/18/2022, 4:46 PM  Available via Epic secure chat 7am-7pm After 7 pm, please refer to night coverage provider listed on amion.

## 2022-04-19 DIAGNOSIS — Z20822 Contact with and (suspected) exposure to covid-19: Secondary | ICD-10-CM | POA: Diagnosis not present

## 2022-04-19 DIAGNOSIS — R197 Diarrhea, unspecified: Secondary | ICD-10-CM | POA: Diagnosis present

## 2022-04-19 DIAGNOSIS — I130 Hypertensive heart and chronic kidney disease with heart failure and stage 1 through stage 4 chronic kidney disease, or unspecified chronic kidney disease: Secondary | ICD-10-CM | POA: Diagnosis not present

## 2022-04-19 DIAGNOSIS — Z66 Do not resuscitate: Secondary | ICD-10-CM | POA: Diagnosis not present

## 2022-04-19 DIAGNOSIS — R5381 Other malaise: Secondary | ICD-10-CM | POA: Diagnosis present

## 2022-04-19 DIAGNOSIS — R9431 Abnormal electrocardiogram [ECG] [EKG]: Secondary | ICD-10-CM | POA: Diagnosis present

## 2022-04-19 DIAGNOSIS — N179 Acute kidney failure, unspecified: Secondary | ICD-10-CM | POA: Diagnosis not present

## 2022-04-19 DIAGNOSIS — E875 Hyperkalemia: Secondary | ICD-10-CM | POA: Diagnosis present

## 2022-04-19 DIAGNOSIS — E1122 Type 2 diabetes mellitus with diabetic chronic kidney disease: Secondary | ICD-10-CM | POA: Diagnosis not present

## 2022-04-19 DIAGNOSIS — N170 Acute kidney failure with tubular necrosis: Secondary | ICD-10-CM | POA: Diagnosis not present

## 2022-04-19 DIAGNOSIS — Z79899 Other long term (current) drug therapy: Secondary | ICD-10-CM | POA: Diagnosis not present

## 2022-04-19 DIAGNOSIS — E039 Hypothyroidism, unspecified: Secondary | ICD-10-CM | POA: Diagnosis not present

## 2022-04-19 DIAGNOSIS — R8271 Bacteriuria: Secondary | ICD-10-CM | POA: Diagnosis present

## 2022-04-19 DIAGNOSIS — N1831 Chronic kidney disease, stage 3a: Secondary | ICD-10-CM | POA: Diagnosis not present

## 2022-04-19 DIAGNOSIS — F0392 Unspecified dementia, unspecified severity, with psychotic disturbance: Secondary | ICD-10-CM | POA: Diagnosis not present

## 2022-04-19 DIAGNOSIS — Z9181 History of falling: Secondary | ICD-10-CM | POA: Diagnosis not present

## 2022-04-19 DIAGNOSIS — E1140 Type 2 diabetes mellitus with diabetic neuropathy, unspecified: Secondary | ICD-10-CM | POA: Diagnosis not present

## 2022-04-19 DIAGNOSIS — L89312 Pressure ulcer of right buttock, stage 2: Secondary | ICD-10-CM | POA: Diagnosis not present

## 2022-04-19 DIAGNOSIS — I480 Paroxysmal atrial fibrillation: Secondary | ICD-10-CM | POA: Diagnosis present

## 2022-04-19 DIAGNOSIS — F05 Delirium due to known physiological condition: Secondary | ICD-10-CM | POA: Diagnosis not present

## 2022-04-19 DIAGNOSIS — T474X5A Adverse effect of other laxatives, initial encounter: Secondary | ICD-10-CM | POA: Diagnosis present

## 2022-04-19 DIAGNOSIS — K219 Gastro-esophageal reflux disease without esophagitis: Secondary | ICD-10-CM | POA: Diagnosis present

## 2022-04-19 DIAGNOSIS — I5042 Chronic combined systolic (congestive) and diastolic (congestive) heart failure: Secondary | ICD-10-CM | POA: Diagnosis not present

## 2022-04-19 LAB — AMMONIA: Ammonia: 39 umol/L — ABNORMAL HIGH (ref 9–35)

## 2022-04-19 LAB — CBC
HCT: 37.1 % (ref 36.0–46.0)
Hemoglobin: 12 g/dL (ref 12.0–15.0)
MCH: 29.9 pg (ref 26.0–34.0)
MCHC: 32.3 g/dL (ref 30.0–36.0)
MCV: 92.3 fL (ref 80.0–100.0)
Platelets: 136 10*3/uL — ABNORMAL LOW (ref 150–400)
RBC: 4.02 MIL/uL (ref 3.87–5.11)
RDW: 13.9 % (ref 11.5–15.5)
WBC: 8.4 10*3/uL (ref 4.0–10.5)
nRBC: 0 % (ref 0.0–0.2)

## 2022-04-19 LAB — URINALYSIS, ROUTINE W REFLEX MICROSCOPIC
Bilirubin Urine: NEGATIVE
Glucose, UA: NEGATIVE mg/dL
Hgb urine dipstick: NEGATIVE
Ketones, ur: NEGATIVE mg/dL
Nitrite: NEGATIVE
Protein, ur: NEGATIVE mg/dL
Specific Gravity, Urine: 1.008 (ref 1.005–1.030)
pH: 6 (ref 5.0–8.0)

## 2022-04-19 LAB — BASIC METABOLIC PANEL
Anion gap: 9 (ref 5–15)
BUN: 27 mg/dL — ABNORMAL HIGH (ref 8–23)
CO2: 25 mmol/L (ref 22–32)
Calcium: 8.8 mg/dL — ABNORMAL LOW (ref 8.9–10.3)
Chloride: 106 mmol/L (ref 98–111)
Creatinine, Ser: 1.3 mg/dL — ABNORMAL HIGH (ref 0.44–1.00)
GFR, Estimated: 37 mL/min — ABNORMAL LOW (ref >60)
Glucose, Bld: 130 mg/dL — ABNORMAL HIGH (ref 70–99)
Potassium: 3.4 mmol/L — ABNORMAL LOW (ref 3.5–5.1)
Sodium: 140 mmol/L (ref 135–145)

## 2022-04-19 LAB — GLUCOSE, CAPILLARY
Glucose-Capillary: 143 mg/dL — ABNORMAL HIGH (ref 70–99)
Glucose-Capillary: 125 mg/dL — ABNORMAL HIGH (ref 70–99)

## 2022-04-19 LAB — HEPATIC FUNCTION PANEL
ALT: 28 U/L (ref 0–44)
AST: 43 U/L — ABNORMAL HIGH (ref 15–41)
Albumin: 3.3 g/dL — ABNORMAL LOW (ref 3.5–5.0)
Alkaline Phosphatase: 54 U/L (ref 38–126)
Bilirubin, Direct: 0.3 mg/dL — ABNORMAL HIGH (ref 0.0–0.2)
Indirect Bilirubin: 0.6 mg/dL (ref 0.3–0.9)
Total Bilirubin: 0.9 mg/dL (ref 0.3–1.2)
Total Protein: 6.6 g/dL (ref 6.5–8.1)

## 2022-04-19 LAB — FOLATE: Folate: 25.3 ng/mL (ref >5.9)

## 2022-04-19 LAB — TSH: TSH: 4.422 u[IU]/mL (ref 0.350–4.500)

## 2022-04-19 LAB — VITAMIN B12: Vitamin B-12: 270 pg/mL (ref 180–914)

## 2022-04-19 LAB — MAGNESIUM: Magnesium: 2.6 mg/dL — ABNORMAL HIGH (ref 1.7–2.4)

## 2022-04-19 MED ORDER — POTASSIUM CHLORIDE CRYS ER 20 MEQ PO TBCR
40.0000 meq | EXTENDED_RELEASE_TABLET | Freq: Once | ORAL | Status: AC
  Administered 2022-04-19: 40 meq via ORAL
  Filled 2022-04-19: qty 2

## 2022-04-19 MED ORDER — LORAZEPAM 2 MG/ML IJ SOLN
1.0000 mg | Freq: Once | INTRAMUSCULAR | Status: AC
  Filled 2022-04-19: qty 1

## 2022-04-19 MED ORDER — HALOPERIDOL LACTATE 5 MG/ML IJ SOLN: 1.0000 mg | Freq: Once | INTRAMUSCULAR | Status: DC

## 2022-04-19 MED ORDER — QUETIAPINE FUMARATE 50 MG PO TABS
25.0000 mg | ORAL_TABLET | ORAL | Status: DC
  Administered 2022-04-19: 25 mg via ORAL
  Filled 2022-04-19: qty 1

## 2022-04-19 MED ORDER — HALOPERIDOL LACTATE 5 MG/ML IJ SOLN
1.0000 mg | Freq: Once | INTRAMUSCULAR | Status: AC
  Administered 2022-04-19: 1 mg via INTRAVENOUS
  Filled 2022-04-19: qty 1

## 2022-04-19 MED ORDER — SODIUM CHLORIDE 0.9 % IV SOLN
1.0000 g | INTRAVENOUS | Status: DC
  Administered 2022-04-19 – 2022-04-20 (×2): 1 g via INTRAVENOUS
  Filled 2022-04-19 (×2): qty 10

## 2022-04-19 MED ORDER — LORAZEPAM 2 MG/ML IJ SOLN
INTRAMUSCULAR | Status: AC
  Filled 2022-04-19: qty 1

## 2022-04-19 MED ORDER — HALOPERIDOL LACTATE 5 MG/ML IJ SOLN
0.5000 mg | Freq: Four times a day (QID) | INTRAMUSCULAR | Status: DC | PRN
  Administered 2022-04-19: 0.5 mg via INTRAVENOUS
  Filled 2022-04-19: qty 1

## 2022-04-19 NOTE — Progress Notes (Addendum)
Pt  hollering loudly. Constantly screaming for police. Confused x3, oriented to name only. Pulled IV out after medication given. Kicking at staff. Bed alarm on. Will continue to monitor.

## 2022-04-19 NOTE — Evaluation (Signed)
Occupational Therapy Evaluation Patient Details Name: [name] MRN: [id number] DOB: July 28, 1923 Today's Date: 04/19/2022   History of Present Illness Patient is 88 y.o. female from [facility] after a fall. Pt reports she was trying to use her walker to get to the wheelchair and fell when trying to get into the wheelchair. Imaging for LE, head, neck, and UE all negative for acute injury. PMH significant for Afib, DM, GERD, HLD, HTN, LBBB, Lt THA.   Clinical Impression   Prior to this admission, patient living at [facility] requiring assist for ADLS and transfers with decline noted in the past two months per chart review. Currently, patient is pleasantly confused, and requiring max to total A of 2 for all ADLs and transfers. OT unable to progress to EOB with patient as patient was extremely inconsistent with command following, impulsive, and demonstrated balance deficits when completing long sitting x3 in bed. OT recommending SNF level rehab due to current level of function. OT will continue to follow acutely.      Recommendations for follow up therapy are one component of a multi-disciplinary discharge planning process, led by the attending physician.  Recommendations may be updated based on patient status, additional functional criteria and insurance authorization.   Follow Up Recommendations  Skilled nursing-short term rehab (<3 hours/day) (at [facility])    Assistance Recommended at Discharge Frequent or constant Supervision/Assistance  Patient can return home with the following Two people to help with walking and/or transfers;Two people to help with bathing/dressing/bathroom;Assistance with cooking/housework;Direct supervision/assist for medications management;Direct supervision/assist for financial management;Assist for transportation;Help with stairs or ramp for entrance    Functional Status Assessment  Patient has had a recent decline in their functional status and  demonstrates the ability to make significant improvements in function in a reasonable and predictable amount of time.  Equipment Recommendations  Other (comment) (Defer to next venue)    Recommendations for Other Services       Precautions / Restrictions Precautions Precautions: Fall Restrictions Weight Bearing Restrictions: No      Mobility Bed Mobility Overal bed mobility: Needs Assistance             General bed mobility comments: able to put to sit x3 during session, could not safely follow commands to transition EOB despite multiple attempts    Transfers                   General transfer comment: unable to attempt safely with OT      Balance Overall balance assessment: History of Falls, Needs assistance Sitting-balance support: Feet supported, Bilateral upper extremity supported Sitting balance-Leahy Scale: Poor                                     ADL either performed or assessed with clinical judgement   ADL Overall ADL's : Needs assistance/impaired Eating/Feeding: Set up;Sitting   Grooming: Set up;Sitting   Upper Body Bathing: Minimal assistance;Moderate assistance;Sitting   Lower Body Bathing: Maximal assistance;Total assistance;Bed level   Upper Body Dressing : Minimal assistance;Moderate assistance;Bed level   Lower Body Dressing: Maximal assistance;Total assistance;Bed level   Toilet Transfer: Total assistance;+2 for physical assistance;+2 for safety/equipment Toilet Transfer Details (indicate cue type and reason): bed level, significant difficulty following commands to attempt safely Toileting- Clothing Manipulation and Hygiene: Total assistance;+2 for physical assistance;+2 for safety/equipment;Bed level       Functional mobility during ADLs: Maximal  assistance;Total assistance;+2 for physical assistance;+2 for safety/equipment;Cueing for sequencing;Cueing for safety General ADL Comments: Patient presenting with  confusion, difficulty following commands, and requiring assist for higher level ADL tasks     Vision Baseline Vision/History: 1 Wears glasses Ability to See in Adequate Light: 0 Adequate Patient Visual Report: No change from baseline       Perception     Praxis      Pertinent Vitals/Pain Pain Assessment Pain Assessment: No/denies pain     Hand Dominance Right   Extremity/Trunk Assessment Upper Extremity Assessment Upper Extremity Assessment: Generalized weakness   Lower Extremity Assessment Lower Extremity Assessment: Defer to PT evaluation   Cervical / Trunk Assessment Cervical / Trunk Assessment: Kyphotic   Communication Communication Communication: HOH   Cognition Arousal/Alertness: Lethargic Behavior During Therapy: Impulsive Overall Cognitive Status: No family/caregiver present to determine baseline cognitive functioning                                 General Comments: Patient pleasantly confused in session, had been given ativan due to combativeness night before OT evaluation, oriented to name and hosptial, and stating May 2023, but then staying statements such as "i asked if i could stay until the baby came, &later in a separate attempt to engage in appropriate conversation "they were holding a gun to my head so I had to make a decision"     General Comments       Exercises     Shoulder Instructions      Home Living Family/patient expects to be discharged to:: Assisted living                             Home Equipment: Rolling Walker (2 wheels);Cane - single point;BSC/3in1;Shower seat;Grab bars - tub/shower;Hand held shower head;Wheelchair - manual          Prior Functioning/Environment Prior Level of Function : Needs assist       Physical Assist : Mobility (physical);ADLs (physical) Mobility (physical): Transfers;Gait;Bed mobility ADLs (physical): Grooming;Bathing;Dressing;Toileting;IADLs Mobility Comments: Pt has  declined over the last 2 months and required increased assist. Now requires assist to transfer ebd<>wc<>chair and has primarily been mobilizing with WC propelling self with feet. ADLs Comments: caregivers/aids at ALF provide assist for bathing, dressing, self care. pt states they will deliver meals to room for $5/meal.        OT Problem List: Decreased strength;Decreased activity tolerance;Impaired balance (sitting and/or standing);Decreased coordination;Decreased cognition;Decreased safety awareness;Decreased knowledge of use of DME or AE;Decreased knowledge of precautions      OT Treatment/Interventions: Self-care/ADL training;Therapeutic exercise;Energy conservation;DME and/or AE instruction;Therapeutic activities;Manual therapy;Cognitive remediation/compensation;Patient/family education;Balance training    OT Goals(Current goals can be found in the care plan section) Acute Rehab OT Goals Patient Stated Goal: unable to state OT Goal Formulation: Patient unable to participate in goal setting Time For Goal Achievement: 05/03/22 Potential to Achieve Goals: Fair  OT Frequency: Min 2X/week    Co-evaluation              AM-PAC OT "6 Clicks" Daily Activity     Outcome Measure Help from another person eating meals?: A Little Help from another person taking care of personal grooming?: A Little Help from another person toileting, which includes using toliet, bedpan, or urinal?: Total Help from another person bathing (including washing, rinsing, drying)?: A Lot Help from another person to put on and taking  off regular upper body clothing?: A Lot Help from another person to put on and taking off regular lower body clothing?: Total 6 Click Score: 12   End of Session    Activity Tolerance: Patient limited by fatigue;Patient limited by lethargy;Other (comment) (Ativan given last night, performance could be due to medication provided) Patient left: in bed;with call bell/phone within  reach;with bed alarm set  OT Visit Diagnosis: Unsteadiness on feet (R26.81);Repeated falls (R29.6);Other abnormalities of gait and mobility (R26.89);Muscle weakness (generalized) (M62.81);History of falling (Z91.81);Adult, failure to thrive (R62.7)                Time: [id number] OT Time Calculation (min): 15 min Charges:  OT General Charges $OT Visit: 1 Visit OT Evaluation $OT Eval Moderate Complexity: 1 Mod  Pollyann GlenMary Kate E. Syana Degraffenreid, OTR/L Acute Rehabilitation Services [phone]   Cherlyn CushingMary Kate Aubriauna Riner 04/19/2022, 12:08 PM

## 2022-04-19 NOTE — Progress Notes (Signed)
[facility]                                                                              [name], is a 88 y.o. female, DOB - 03/23/1923, [id number] Admit date - 04/16/2022    Outpatient Primary MD for the patient is Mayra Neer, MD  LOS - 0  days  Chief Complaint  Patient presents with   Fall       Brief summary   Patient is a 88 year old female with history of DM, CKD stage III, HTN, chronic systolic CHF EF of 30 to [id number] presented from [facility], [facility] with a fall.  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  He had pushed her wheelchair out of the way to perform his duties.  He failed to put the wheelchair back next to her when he left.  Patient ported that she was trying to use her walker to get to the wheelchair and fell when trying to get into the wheelchair. Patient had been treated with laxatives for the last 2 days due to constipation and subsequently had massive amounts of diarrhea. Creatinine 2.6, BUN 30, potassium 6.0 Patient was admitted for further work-up.    Assessment & Plan    Principal Problem:   Acute renal failure superimposed on stage 3a chronic kidney disease (HCC) -Likely due to significant diarrhea from laxatives -Presented with creatinine of 2.6, plateaued at 2.8, baseline creatinine 1.2-1.5 - has history of chronic systolic CHF, 30 to [id number], losartan, torsemide and Aldactone currently on hold -Creatinine improved to 1.3, IV fluids discontinued  Active Problems: Acute metabolic encephalopathy/sundowning, superimposed on underlying dementia -Overnight was noted to be delirious, sundowning, agitated -Mildly elevated ammonia level 39, obtain urine culture, based on UA on 5/31, placed on IV Rocephin -Haldol 1 mg IV x1, follow QTc, placed on Seroquel 25 mg nightly, hold Remeron -Delirium precautions     Hyperkalemia -Potassium 7.1 on admission - resolved, now 3.4     Type 2 diabetes mellitus with diabetic neuropathy, without long-term current use of insulin (HCC) -Hemoglobin A1c 6.3 -Continue sliding scale insulin     Essential hypertension -Continue Coreg, BP stable -Renal function has improved, CHF compensated  -Given ongoing issues with delirium, continue to hold torsemide, Aldactone, Cozaar     Atrial fibrillation (HCC) -Currently rate controlled, continue Coreg -Not on anticoagulation due to history of falls    GERD -Continue PPI    Chronic combined systolic and diastolic congestive heart failure ([place]) -2D echo May/2022 showed EF of 30 to 35% -Currently compensated, holding torsemide, Aldactone due to acute kidney injury     Hypothyroid -Continue Synthroid  Generalized debility -Feels generalized weakness, debility, currently in ALF PT evaluation recommending SNF.   Code Status: DNR/DNI DVT Prophylaxis:  heparin injection 5,000 Units Start: 04/17/22 0600 SCDs Start: 04/17/22 0236   Level of Care: Level of care: Med-Surg Family Communication: Updated patient's niece, Ms. Charlena Cross on the phone   Disposition Plan:      Remains inpatient appropriate: PT evaluation recommended SNF.  Patient's niece also reported that the assisted living  facility has levels of care and PT.   Procedures:  None  Consultants:   None  Antimicrobials:  None   Medications  aspirin  81 mg Oral Daily   buPROPion  150 mg Oral Daily   carvedilol  6.25 mg Oral BID   heparin  5,000 Units Subcutaneous Q8H   HYDROcodone-acetaminophen  1 tablet Oral BID   insulin aspart  0-5 Units Subcutaneous QHS   insulin aspart  0-9 Units Subcutaneous TID WC   levothyroxine  25 mcg Oral QAC breakfast   mirabegron ER  25 mg Oral Daily   pantoprazole  40 mg Oral BID   QUEtiapine  25 mg Oral Q24H   sertraline  200 mg Oral Daily   simvastatin  40 mg Oral QHS      Subjective:   [name] was seen and examined today.  Overnight noted to be  delirious, agitated, pulled IV lines.  On examination, still confused and rambling.  Objective:   Vitals:   04/18/22 1540 04/18/22 2012 04/19/22 0443 04/19/22 1138  BP: 118/62 (!) 145/74  121/63  Pulse: 69 78 85 76  Resp: 18 17  16   Temp: 98.2 F (36.8 C) 99.2 F (37.3 C)  (!) 97.3 F (36.3 C)  TempSrc: Oral Oral  Oral  SpO2: 97% 98% 96% 98%  Weight:      Height:        Intake/Output Summary (Last 24 hours) at 04/19/2022 1426 Last data filed at 04/19/2022 0700 Gross per 24 hour  Intake 120 ml  Output 700 ml  Net -580 ml     Wt Readings from Last 3 Encounters:  04/18/22 97.8 kg  04/15/22 96 kg  03/22/22 96 kg    Physical Exam General: Oriented to self and time, still confused, not at baseline, rambling Cardiovascular: S1 S2 clear, RRR Respiratory: CTAB, no wheezing, rales  Gastrointestinal: Soft, nontender, nondistended, NBS Ext: no pedal edema bilaterally Neuro: moving all 4 extremities spontaneously, no acute FND's Skin: No rashes Psych: delirious     Data Reviewed:  I have personally reviewed following labs    CBC Lab Results  Component Value Date   WBC 8.4 04/19/2022   RBC 4.02 04/19/2022   HGB 12.0 04/19/2022   HCT 37.1 04/19/2022   MCV 92.3 04/19/2022   MCH 29.9 04/19/2022   PLT 136 (L) 04/19/2022   MCHC 32.3 04/19/2022   RDW 13.9 04/19/2022   LYMPHSABS 1.7 04/17/2022   MONOABS 1.0 04/17/2022   EOSABS 0.1 04/17/2022   BASOSABS 0.0 [id number]     Last metabolic panel Lab Results  Component Value Date   NA 140 04/19/2022   K 3.4 (L) 04/19/2022   CL 106 04/19/2022   CO2 25 04/19/2022   BUN 27 (H) 04/19/2022   CREATININE 1.30 (H) 04/19/2022   GLUCOSE 130 (H) 04/19/2022   GFRNONAA 37 (L) 04/19/2022   GFRAA 37 (L) 07/02/2019   CALCIUM 8.8 (L) 04/19/2022   PROT 6.6 04/19/2022   ALBUMIN 3.3 (L) 04/19/2022   BILITOT 0.9 04/19/2022   ALKPHOS 54 04/19/2022   AST 43 (H) 04/19/2022   ALT 28 04/19/2022   ANIONGAP 9 04/19/2022    CBG (last  3)  Recent Labs    04/18/22 1737 04/18/22 2014 04/19/22 1122  GLUCAP 169* 145* 143*      Coagulation Profile: Recent Labs  Lab 04/16/22 2237  INR 1.2     [name] M.D. [facility] 04/19/2022, 2:26 PM  Available via Epic secure chat  7am-7pm After 7 pm, please refer to night coverage provider listed on amion.

## 2022-04-19 NOTE — Plan of Care (Signed)
  Problem: Coping: Goal: Level of anxiety will decrease Outcome: Progressing   Problem: Pain Managment: Goal: General experience of comfort will improve Outcome: Progressing   Problem: Safety: Goal: Ability to remain free from injury will improve Outcome: Progressing   

## 2022-04-19 NOTE — Progress Notes (Signed)
       CROSS COVER NOTE  NAME: [name] MRN: [id number] DOB : 29-Nov-1922  Paged by bedside RN reporting patient is "screaming constantly for the police. Confused x3" and that she is not cooperative enough to take ordered PO meds at this time. On chart review last EKG shows qTC of 544 and Intraventricular conduction delay. 1 mg IV Ativan ordered.  Bishop Limbo DNP, MHA, FNP-BC Nurse Practitioner [facility] Pager [phone]

## 2022-04-20 LAB — BASIC METABOLIC PANEL
Anion gap: 10 (ref 5–15)
BUN: 27 mg/dL — ABNORMAL HIGH (ref 8–23)
CO2: 22 mmol/L (ref 22–32)
Calcium: 8.6 mg/dL — ABNORMAL LOW (ref 8.9–10.3)
Chloride: 112 mmol/L — ABNORMAL HIGH (ref 98–111)
Creatinine, Ser: 1.26 mg/dL — ABNORMAL HIGH (ref 0.44–1.00)
GFR, Estimated: 39 mL/min — ABNORMAL LOW (ref >60)
Glucose, Bld: 119 mg/dL — ABNORMAL HIGH (ref 70–99)
Potassium: 4.1 mmol/L (ref 3.5–5.1)
Sodium: 144 mmol/L (ref 135–145)

## 2022-04-20 LAB — GLUCOSE, CAPILLARY
Glucose-Capillary: 110 mg/dL — ABNORMAL HIGH (ref 70–99)
Glucose-Capillary: 180 mg/dL — ABNORMAL HIGH (ref 70–99)
Glucose-Capillary: 113 mg/dL — ABNORMAL HIGH (ref 70–99)
Glucose-Capillary: 152 mg/dL — ABNORMAL HIGH (ref 70–99)

## 2022-04-20 LAB — URINE CULTURE: Culture: NO GROWTH

## 2022-04-20 MED ORDER — LOSARTAN POTASSIUM 25 MG PO TABS
25.0000 mg | ORAL_TABLET | Freq: Every day | ORAL | Status: DC
Start: 2022-04-20 — End: 2022-04-24
  Administered 2022-04-20 – 2022-04-23 (×4): 25 mg via ORAL
  Filled 2022-04-20 (×4): qty 1

## 2022-04-20 MED ORDER — SODIUM CHLORIDE 0.9 % IV SOLN
1.0000 g | INTRAVENOUS | Status: DC
  Administered 2022-04-21: 1 g via INTRAVENOUS
  Filled 2022-04-20: qty 10

## 2022-04-20 MED ORDER — HYDROCODONE-ACETAMINOPHEN 5-325 MG PO TABS: 1.0000 | ORAL_TABLET | Freq: Two times a day (BID) | ORAL | Status: DC | PRN

## 2022-04-20 NOTE — Progress Notes (Addendum)
[facility]                                                                              Eleanor Bryers, is a 88 y.o. female, DOB - Jan 10, 1923, [id number] Admit date - 04/16/2022    Outpatient Primary MD for the patient is Mayra Neer, MD  LOS - 1  days  Chief Complaint  Patient presents with   Fall       Brief summary   Patient is a 88 year old female with history of DM, CKD stage III, HTN, chronic systolic CHF EF of 30 to [id number] presented from [facility], [facility] with a fall.  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  He had pushed her wheelchair out of the way to perform his duties.  He failed to put the wheelchair back next to her when he left.  Patient ported that she was trying to use her walker to get to the wheelchair and fell when trying to get into the wheelchair. Patient had been treated with laxatives for the last 2 days due to constipation and subsequently had massive amounts of diarrhea. Creatinine 2.6, BUN 30, potassium 6.0 Patient was admitted for further work-up.  6/3: Still having significant delirium. Switched scheduled norco to prn. Delirium precautions. Qtc prolonged, stopped seroquil, haldol. Restart lorsartan. Continue abx for UTI.   Assessment & Plan    Acute renal failure superimposed on stage 3a chronic kidney disease ([place]) Likely due to significant diarrhea from laxatives. Presented with creatinine of 2.6, plateaued at 2.8 now 1.2, baseline creatinine 1.2-1.5. - losartan, torsemide and Aldactone was on hold restart lorsartan  Acute metabolic encephalopathy/sundowning, superimposed on underlying dementia Overnight was noted to be delirious, sundowning, agitated.UTI may be contributing.  -Haldol 1 mg IV x1, follow QTc, placed on Seroquel 25 mg nightly, hold Remeron -Delirium precautions -once Delirium improved may be ready for SNF per PT recs   Qtc prolongation Judicious with Qtc  prolonging medications, pt getting seroquil, haldol, zofran, will discontinue at this time -prn ativan for agitation   UTI based on UA on 5/31, placed on IV Rocephin 6/2. unfortunately urine culture cannot be added on at this time.  History of Klebsiella UTI in 2017 that was multidrug-resistant, resistant to Macrobid, continue Rocephin at this time. -Continue Rocephin  Type 2 diabetes mellitus with diabetic neuropathy, without long-term current use of insulin (HCC) -Hemoglobin A1c 6.3 -Continue sliding scale insulin   Essential hypertension Above goal today -Continue Coreg -restart lorsartan -continue to hold torsemide, Aldactone   Atrial fibrillation (HCC) -Currently rate controlled, continue Coreg -Not on anticoagulation due to history of falls    GERD -Continue PPI   Chronic combined systolic and diastolic congestive heart failure ([place]) -2D echo May/2022 showed EF of 30 to 35% -Currently compensated, holding torsemide, Aldactone due to acute kidney injury -restart losartan today   Hypothyroid -Continue Synthroid  Generalized debility Feels generalized weakness, debility, currently in ALF PT evaluation recommending SNF.  Code Status: DNR/DNI DVT Prophylaxis:  heparin injection 5,000 Units Start: 04/17/22 0600 SCDs Start: 04/17/22 0236   Level of Care: Level  of care: Med-Surg Family Communication: Updated patient's niece, Ms. Charlena Cross on the phone   Disposition Plan:      Remains inpatient appropriate: PT evaluation recommended SNF.  Patient's niece also reported that the assisted living facility has levels of care and PT.   Procedures:  None  Consultants:   None  Antimicrobials:  None   Medications  aspirin  81 mg Oral Daily   buPROPion  150 mg Oral Daily   carvedilol  6.25 mg Oral BID   heparin  5,000 Units Subcutaneous Q8H   insulin aspart  0-5 Units Subcutaneous QHS   insulin aspart  0-9 Units Subcutaneous TID WC   levothyroxine  25 mcg  Oral QAC breakfast   mirabegron ER  25 mg Oral Daily   pantoprazole  40 mg Oral BID   QUEtiapine  25 mg Oral Q24H   sertraline  200 mg Oral Daily   simvastatin  40 mg Oral QHS      Subjective:   Andrianne Vilches was seen and examined today.  Patient was given agitated overnight.  She says she feels much better today, knows where she is.   Objective:   Vitals:   04/19/22 1138 04/20/22 0652 04/20/22 0718 04/20/22 1601  BP: 121/63 (!) 168/70 (!) 161/65 (!) 145/67  Pulse: 76 73 75 73  Resp: 16 18 16 16   Temp: (!) 97.3 F (36.3 C) (!) 97.5 F (36.4 C) 97.9 F (36.6 C) 98 F (36.7 C)  TempSrc: Oral Oral  Oral  SpO2: 98% 100% 100% 98%  Weight:      Height:       No intake or output data in the 24 hours ending 04/20/22 1743    Wt Readings from Last 3 Encounters:  04/18/22 97.8 kg  04/15/22 96 kg  03/22/22 96 kg    Physical Exam General: Oriented to self and time, still confused, not at baseline, rambling Cardiovascular: S1 S2 clear, RRR Respiratory: CTAB, no wheezing, rales  Gastrointestinal: Soft, nontender, nondistended, NBS Ext: no pedal edema bilaterally Neuro: moving all 4 extremities spontaneously, no acute FND's Skin: No rashes Psych: delirious   Data Reviewed:  I have personally reviewed following labs    CBC Lab Results  Component Value Date   WBC 8.4 04/19/2022   RBC 4.02 04/19/2022   HGB 12.0 04/19/2022   HCT 37.1 04/19/2022   MCV 92.3 04/19/2022   MCH 29.9 04/19/2022   PLT 136 (L) 04/19/2022   MCHC 32.3 04/19/2022   RDW 13.9 04/19/2022   LYMPHSABS 1.7 04/17/2022   MONOABS 1.0 04/17/2022   EOSABS 0.1 04/17/2022   BASOSABS 0.0 [id number]     Last metabolic panel Lab Results  Component Value Date   NA 144 04/20/2022   K 4.1 04/20/2022   CL 112 (H) 04/20/2022   CO2 22 04/20/2022   BUN 27 (H) 04/20/2022   CREATININE 1.26 (H) 04/20/2022   GLUCOSE 119 (H) 04/20/2022   GFRNONAA 39 (L) 04/20/2022   GFRAA 37 (L) 07/02/2019   CALCIUM  8.6 (L) 04/20/2022   PROT 6.6 04/19/2022   ALBUMIN 3.3 (L) 04/19/2022   BILITOT 0.9 04/19/2022   ALKPHOS 54 04/19/2022   AST 43 (H) 04/19/2022   ALT 28 04/19/2022   ANIONGAP 10 04/20/2022    CBG (last 3)  Recent Labs    04/20/22 0753 04/20/22 1114 04/20/22 1640  GLUCAP 110* 180* 113*      Coagulation Profile: Recent Labs  Lab 04/16/22 2237  INR 1.2  Lorelei Pont M.D. [facility] 04/20/2022, 5:43 PM  Available via Epic secure chat 7am-7pm After 7 pm, please refer to night coverage provider listed on amion.

## 2022-04-20 NOTE — Progress Notes (Signed)
Pt refused all meds MD informed

## 2022-04-21 DIAGNOSIS — N1831 Chronic kidney disease, stage 3a: Secondary | ICD-10-CM | POA: Diagnosis not present

## 2022-04-21 DIAGNOSIS — N170 Acute kidney failure with tubular necrosis: Secondary | ICD-10-CM | POA: Diagnosis not present

## 2022-04-21 LAB — CBC
HCT: 34.9 % — ABNORMAL LOW (ref 36.0–46.0)
Hemoglobin: 11.6 g/dL — ABNORMAL LOW (ref 12.0–15.0)
MCH: 30.6 pg (ref 26.0–34.0)
MCHC: 33.2 g/dL (ref 30.0–36.0)
MCV: 92.1 fL (ref 80.0–100.0)
Platelets: 141 10*3/uL — ABNORMAL LOW (ref 150–400)
RBC: 3.79 MIL/uL — ABNORMAL LOW (ref 3.87–5.11)
RDW: 14 % (ref 11.5–15.5)
WBC: 10.9 10*3/uL — ABNORMAL HIGH (ref 4.0–10.5)
nRBC: 0 % (ref 0.0–0.2)

## 2022-04-21 LAB — GLUCOSE, CAPILLARY
Glucose-Capillary: 143 mg/dL — ABNORMAL HIGH (ref 70–99)
Glucose-Capillary: 184 mg/dL — ABNORMAL HIGH (ref 70–99)
Glucose-Capillary: 138 mg/dL — ABNORMAL HIGH (ref 70–99)
Glucose-Capillary: 146 mg/dL — ABNORMAL HIGH (ref 70–99)

## 2022-04-21 LAB — BASIC METABOLIC PANEL
Anion gap: 5 (ref 5–15)
BUN: 24 mg/dL — ABNORMAL HIGH (ref 8–23)
CO2: 25 mmol/L (ref 22–32)
Calcium: 8.7 mg/dL — ABNORMAL LOW (ref 8.9–10.3)
Chloride: 109 mmol/L (ref 98–111)
Creatinine, Ser: 1.07 mg/dL — ABNORMAL HIGH (ref 0.44–1.00)
GFR, Estimated: 47 mL/min — ABNORMAL LOW (ref >60)
Glucose, Bld: 135 mg/dL — ABNORMAL HIGH (ref 70–99)
Potassium: 3.9 mmol/L (ref 3.5–5.1)
Sodium: 139 mmol/L (ref 135–145)

## 2022-04-21 NOTE — Plan of Care (Signed)
  Problem: Safety: Goal: Ability to remain free from injury will improve Outcome: Not Progressing   Problem: Skin Integrity: Goal: Risk for impaired skin integrity will decrease Outcome: Not Progressing   

## 2022-04-21 NOTE — Progress Notes (Signed)
PROGRESS NOTE    [name]  [id number] DOB: 02-18-1923 DOA: 04/16/2022 PCP: Mayra Neer, MD   Brief Narrative:  Patient is a 88 year old female with history of DM, CKD stage III, HTN, chronic systolic CHF EF of 30 to [id number] presented from [facility], ALF with a fall.  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  He had pushed her wheelchair out of the way to perform his duties.  He failed to put the wheelchair back next to her when he left.  Patient ported that she was trying to use her walker to get to the wheelchair and fell when trying to get into the wheelchair. Patient had been treated with laxatives for the last 2 days due to constipation and subsequently had massive amounts of diarrhea. Creatinine 2.6, BUN 30, potassium 6.0 Patient was admitted for further work-up.   6/3: Still having significant delirium. Switched scheduled norco to prn. Delirium precautions. Qtc prolonged, stopped seroquil, haldol. Restart lorsartan. Continue abx for UTI.   Assessment & Plan:   Principal Problem:   Acute renal failure superimposed on stage 3a chronic kidney disease (HCC) Active Problems:   Hyperkalemia   Type 2 diabetes mellitus with diabetic neuropathy, without long-term current use of insulin (HCC)   Essential hypertension   Atrial fibrillation (HCC)   GERD   Chronic combined systolic and diastolic congestive heart failure (HCC)   Stage 3a chronic kidney disease (CKD) (HCC) - baseline SCr 1.25-1.5   Hypothyroid   DNR (do not resuscitate)/DNI(Do No Intubate)   Acute kidney injury ([place])  Acute renal failure superimposed on stage 3a chronic kidney disease ([place]) Likely due to significant diarrhea from laxatives. Presented with creatinine of 2.6, plateaued at 2.8 now 1.07, baseline creatinine 1.2-1.5. torsemide and Aldactone on hold.   UTI/asymptomatic bacteriuria: Patient has been started on Rocephin for UTI however patient's  UA appears to be contaminated, not impressive enough, urine culture negative as well.  I doubt it is a real UTI. Nonetheless, she has received 3 days of Rocephin. Will discontinue Rocephin.   Acute metabolic encephalopathy/sundowning, superimposed on underlying dementia: Patient fully alert and oriented today.   Qtc prolongation Judicious with Qtc prolonging medications, all QTc prolonging medications were discontinued yesterday.   Type 2 diabetes mellitus with diabetic neuropathy, without long-term current use of insulin (HCC) -Hemoglobin A1c 6.3 -Continue sliding scale insulin.  Blood sugar control.    Essential hypertension: Fairly controlled.  Continue Coreg and losartan.  Continue to hold torsemide and Aldactone.  Paroxysmal atrial fibrillation?  This diagnosis is documented in her problem list however I reviewed several EKGs going back 3 years from now but not even a single EKG has atrial fibrillation.  She is not on any anticoagulation either.  Unsure if she really has history of atrial fibrillation.  Continue Coreg.    GERD -Continue PPI    Chronic combined systolic and diastolic congestive heart failure ([place]) -2D echo May/2022 showed EF of 30 to 35% -Currently compensated, holding torsemide, Aldactone due to acute kidney injury.  Will resume tomorrow.   Hypothyroid -Continue Synthroid  DVT prophylaxis: heparin injection 5,000 Units Start: 04/17/22 0600 SCDs Start: 04/17/22 0236   Code Status: DNR  Family Communication:  None present at bedside.  Plan of care discussed with patient in length and he/she verbalized understanding and agreed with it.  Status is: Inpatient Remains inpatient appropriate because: Improving, needs another night of observation.   Estimated body mass  index is 28.76 kg/m as calculated from the following:   Height as of this encounter: 6' (1.829 m).   Weight as of this encounter: 96.2 kg.    Nutritional Assessment: Body mass index is 28.76  kg/m.Marland Kitchen Seen by dietician.  I agree with the assessment and plan as outlined below: Nutrition Status:        . Skin Assessment: I have examined the patient's skin and I agree with the wound assessment as performed by the wound care RN as outlined below:    Consultants:  None  Procedures:  None  Antimicrobials:  Anti-infectives (From admission, onward)    Start     Dose/Rate Route Frequency Ordered Stop   04/21/22 1200  cefTRIAXone (ROCEPHIN) 1 g in sodium chloride 0.9 % 100 mL IVPB        1 g 200 mL/hr over 30 Minutes Intravenous Every 24 hours 04/20/22 1337     04/19/22 1100  cefTRIAXone (ROCEPHIN) 1 g in sodium chloride 0.9 % 100 mL IVPB  Status:  Discontinued        1 g 200 mL/hr over 30 Minutes Intravenous Every 24 hours 04/19/22 1012 04/20/22 1335         Subjective: Patient seen and examined.  She is fully alert and oriented.  She has no complaints.  Objective: Vitals:   04/20/22 2042 04/21/22 0527 04/21/22 0634 04/21/22 0757  BP: (!) 162/64 (!) 155/74  (!) 152/69  Pulse: 72 75  74  Resp: 17 18    Temp: 97.9 F (36.6 C) 97.9 F (36.6 C)  98.8 F (37.1 C)  TempSrc: Oral Oral  Oral  SpO2: 100% 100%  99%  Weight:   96.2 kg   Height:        Intake/Output Summary (Last 24 hours) at 04/21/2022 1320 Last data filed at 04/21/2022 1232 Gross per 24 hour  Intake 520 ml  Output 1200 ml  Net -680 ml   Filed Weights   04/16/22 2116 04/18/22 0500 04/21/22 0634  Weight: 96 kg 97.8 kg 96.2 kg    Examination:  General exam: Appears calm and comfortable  Respiratory system: Clear to auscultation. Respiratory effort normal. Cardiovascular system: S1 & S2 heard, RRR. No JVD, murmurs, rubs, gallops or clicks. No pedal edema. Gastrointestinal system: Abdomen is nondistended, soft and nontender. No organomegaly or masses felt. Normal bowel sounds heard. Central nervous system: Alert and oriented. No focal neurological deficits. Extremities: Symmetric 5 x 5  power. Skin: No rashes, lesions or ulcers Psychiatry: Judgement and insight appear normal. Mood & affect appropriate.    Data Reviewed: I have personally reviewed following labs and imaging studies  CBC: Recent Labs  Lab 04/15/22 1034 04/15/22 1400 04/16/22 2237 04/16/22 2243 04/17/22 0626 04/18/22 0607 04/19/22 0417 04/21/22 0059  WBC 9.7  --  15.3*  --  13.3* 8.8 8.4 10.9*  NEUTROABS 7.4  --   --   --  10.3*  --   --   --   HGB 13.7   < > 12.9 13.3 12.1 11.8* 12.0 11.6*  HCT 40.3   < > 40.6 39.0 37.0 35.4* 37.1 34.9*  MCV 89.8  --  93.5  --  92.5 91.5 92.3 92.1  PLT 152  --  162  --  148* 134* 136* 141*   < > = values in this interval not displayed.   Basic Metabolic Panel: Recent Labs  Lab 04/17/22 0626 04/18/22 0607 04/19/22 0417 04/19/22 1018 04/20/22 0136 04/21/22 0059  NA  139 138 140  --  144 139  K 3.6 3.4* 3.4*  --  4.1 3.9  CL 103 103 106  --  112* 109  CO2 26 29 25   --  22 25  GLUCOSE 124* 123* 130*  --  119* 135*  BUN 29* 26* 27*  --  27* 24*  CREATININE 2.15* 1.57* 1.30*  --  1.26* 1.07*  CALCIUM 8.9 8.8* 8.8*  --  8.6* 8.7*  MG 3.0*  --   --  2.6*  --   --    GFR: Estimated Creatinine Clearance: 39 mL/min (A) (by C-G formula based on SCr of 1.07 mg/dL (H)). Liver Function Tests: Recent Labs  Lab 04/15/22 1034 04/16/22 2237 04/17/22 0626 04/19/22 1018  AST 23 103* 69* 43*  ALT 16 23 28 28   ALKPHOS 64 58 58 54  BILITOT 0.5 0.6 0.4 0.9  PROT 7.2 6.8 6.3* 6.6  ALBUMIN 3.7 3.6 3.3* 3.3*   No results for input(s): LIPASE, AMYLASE in the last 168 hours. Recent Labs  Lab 04/19/22 1018  AMMONIA 39*   Coagulation Profile: Recent Labs  Lab 04/16/22 2237  INR 1.2   Cardiac Enzymes: No results for input(s): CKTOTAL, CKMB, CKMBINDEX, TROPONINI in the last 168 hours. BNP (last 3 results) No results for input(s): PROBNP in the last 8760 hours. HbA1C: No results for input(s): HGBA1C in the last 72 hours. CBG: Recent Labs  Lab  04/20/22 1114 04/20/22 1640 04/20/22 2040 04/21/22 0754 04/21/22 1210  GLUCAP 180* 113* 152* 143* 184*   Lipid Profile: No results for input(s): CHOL, HDL, LDLCALC, TRIG, CHOLHDL, LDLDIRECT in the last 72 hours. Thyroid Function Tests: Recent Labs    04/19/22 1018  TSH 4.422   Anemia Panel: Recent Labs    04/19/22 1018  VITAMINB12 270  FOLATE 25.3   Sepsis Labs: Recent Labs  Lab 04/16/22 2237  LATICACIDVEN 3.0*    Recent Results (from the past 240 hour(s))  Resp Panel by RT-PCR (Flu A&B, Covid) Anterior Nasal Swab     Status: None   Collection Time: 04/15/22 10:43 AM   Specimen: Anterior Nasal Swab  Result Value Ref Range Status   SARS Coronavirus 2 by RT PCR NEGATIVE NEGATIVE Final    Comment: (NOTE) SARS-CoV-2 target nucleic acids are NOT DETECTED.  The SARS-CoV-2 RNA is generally detectable in upper respiratory specimens during the acute phase of infection. The lowest concentration of SARS-CoV-2 viral copies this assay can detect is 138 copies/mL. A negative result does not preclude SARS-Cov-2 infection and should not be used as the sole basis for treatment or other patient management decisions. A negative result may occur with  improper specimen collection/handling, submission of specimen other than nasopharyngeal swab, presence of viral mutation(s) within the areas targeted by this assay, and inadequate number of viral copies(<138 copies/mL). A negative result must be combined with clinical observations, patient history, and epidemiological information. The expected result is Negative.  Fact Sheet for Patients:  EntrepreneurPulse.com.au  Fact Sheet for Healthcare Providers:  IncredibleEmployment.be  This test is no t yet approved or cleared by the [place] FDA and  has been authorized for detection and/or diagnosis of SARS-CoV-2 by FDA under an Emergency Use Authorization (EUA). This EUA will remain  in effect  (meaning this test can be used) for the duration of the COVID-19 declaration under Section 564(b)(1) of the Act, 21 U.S.C.section 360bbb-3(b)(1), unless the authorization is terminated  or revoked sooner.       Influenza A  by PCR NEGATIVE NEGATIVE Final   Influenza B by PCR NEGATIVE NEGATIVE Final    Comment: (NOTE) The Xpert Xpress SARS-CoV-2/FLU/RSV plus assay is intended as an aid in the diagnosis of influenza from Nasopharyngeal swab specimens and should not be used as a sole basis for treatment. Nasal washings and aspirates are unacceptable for Xpert Xpress SARS-CoV-2/FLU/RSV testing.  Fact Sheet for Patients: EntrepreneurPulse.com.au  Fact Sheet for Healthcare Providers: IncredibleEmployment.be  This test is not yet approved or cleared by the [place] FDA and has been authorized for detection and/or diagnosis of SARS-CoV-2 by FDA under an Emergency Use Authorization (EUA). This EUA will remain in effect (meaning this test can be used) for the duration of the COVID-19 declaration under Section 564(b)(1) of the Act, 21 U.S.C. section 360bbb-3(b)(1), unless the authorization is terminated or revoked.  Performed at [facility], [place] [address] [place], [place] [address]   Resp Panel by RT-PCR (Flu A&B, Covid) Anterior Nasal Swab     Status: None   Collection Time: 04/16/22 10:37 PM   Specimen: Anterior Nasal Swab  Result Value Ref Range Status   SARS Coronavirus 2 by RT PCR NEGATIVE NEGATIVE Final    Comment: (NOTE) SARS-CoV-2 target nucleic acids are NOT DETECTED.  The SARS-CoV-2 RNA is generally detectable in upper respiratory specimens during the acute phase of infection. The lowest concentration of SARS-CoV-2 viral copies this assay can detect is 138 copies/mL. A negative result does not preclude SARS-Cov-2 infection and should not be used as the sole basis for treatment or other patient management decisions. A  negative result may occur with  improper specimen collection/handling, submission of specimen other than nasopharyngeal swab, presence of viral mutation(s) within the areas targeted by this assay, and inadequate number of viral copies(<138 copies/mL). A negative result must be combined with clinical observations, patient history, and epidemiological information. The expected result is Negative.  Fact Sheet for Patients:  EntrepreneurPulse.com.au  Fact Sheet for Healthcare Providers:  IncredibleEmployment.be  This test is no t yet approved or cleared by the [place] FDA and  has been authorized for detection and/or diagnosis of SARS-CoV-2 by FDA under an Emergency Use Authorization (EUA). This EUA will remain  in effect (meaning this test can be used) for the duration of the COVID-19 declaration under Section 564(b)(1) of the Act, 21 U.S.C.section 360bbb-3(b)(1), unless the authorization is terminated  or revoked sooner.       Influenza A by PCR NEGATIVE NEGATIVE Final   Influenza B by PCR NEGATIVE NEGATIVE Final    Comment: (NOTE) The Xpert Xpress SARS-CoV-2/FLU/RSV plus assay is intended as an aid in the diagnosis of influenza from Nasopharyngeal swab specimens and should not be used as a sole basis for treatment. Nasal washings and aspirates are unacceptable for Xpert Xpress SARS-CoV-2/FLU/RSV testing.  Fact Sheet for Patients: EntrepreneurPulse.com.au  Fact Sheet for Healthcare Providers: IncredibleEmployment.be  This test is not yet approved or cleared by the [place] FDA and has been authorized for detection and/or diagnosis of SARS-CoV-2 by FDA under an Emergency Use Authorization (EUA). This EUA will remain in effect (meaning this test can be used) for the duration of the COVID-19 declaration under Section 564(b)(1) of the Act, 21 U.S.C. section 360bbb-3(b)(1), unless the authorization  is terminated or revoked.  Performed at [facility], [place] [address] [place], [place] [address]   Urine Culture     Status: None   Collection Time: 04/19/22 10:54 AM   Specimen: Urine, Clean  Catch  Result Value Ref Range Status   Specimen Description URINE, CLEAN CATCH  Final   Special Requests NONE  Final   Culture   Final    NO GROWTH Performed at [facility], [address], [place], [place] [address]    Report Status 04/20/2022 FINAL  Final     Radiology Studies: No results found.  Scheduled Meds:  aspirin  81 mg Oral Daily   buPROPion  150 mg Oral Daily   carvedilol  6.25 mg Oral BID   heparin  5,000 Units Subcutaneous Q8H   insulin aspart  0-5 Units Subcutaneous QHS   insulin aspart  0-9 Units Subcutaneous TID WC   levothyroxine  25 mcg Oral QAC breakfast   losartan  25 mg Oral Daily   mirabegron ER  25 mg Oral Daily   pantoprazole  40 mg Oral BID   sertraline  200 mg Oral Daily   simvastatin  40 mg Oral QHS   Continuous Infusions:  cefTRIAXone (ROCEPHIN)  IV 1 g (04/21/22 1232)     LOS: 2 days   Darliss Cheney, MD [facility]  04/21/2022, 1:20 PM   *Please note that this is a verbal dictation therefore any spelling or grammatical errors are due to the "[place]" system interpretation.  Please page via [place] and do not message via secure chat for urgent patient care matters. Secure chat can be used for non urgent patient care matters.  How to contact the [facility] Attending or Consulting provider [place] or covering provider during after hours [place], for this patient?  Check the care team in [facility] and look for a) attending/consulting [facility] provider listed and b) the [facility] team listed. Page or secure chat [phone]. Log into [email or web address] and use [place]'s universal password to access. If you do not have the password, please contact the hospital operator. Locate the [facility] provider you are looking for under [facility] and page to a  number that you can be directly reached. If you still have difficulty reaching the provider, please page the [facility] (Director on Call) for the Hospitalists listed on amion for assistance.

## 2022-04-22 DIAGNOSIS — L8990 Pressure ulcer of unspecified site, unspecified stage: Secondary | ICD-10-CM | POA: Insufficient documentation

## 2022-04-22 DIAGNOSIS — N1831 Chronic kidney disease, stage 3a: Secondary | ICD-10-CM | POA: Diagnosis not present

## 2022-04-22 DIAGNOSIS — N170 Acute kidney failure with tubular necrosis: Secondary | ICD-10-CM | POA: Diagnosis not present

## 2022-04-22 LAB — GLUCOSE, CAPILLARY
Glucose-Capillary: 128 mg/dL — ABNORMAL HIGH (ref 70–99)
Glucose-Capillary: 128 mg/dL — ABNORMAL HIGH (ref 70–99)
Glucose-Capillary: 194 mg/dL — ABNORMAL HIGH (ref 70–99)
Glucose-Capillary: 121 mg/dL — ABNORMAL HIGH (ref 70–99)

## 2022-04-22 LAB — BASIC METABOLIC PANEL
Anion gap: 11 (ref 5–15)
BUN: 18 mg/dL (ref 8–23)
CO2: 24 mmol/L (ref 22–32)
Calcium: 9.1 mg/dL (ref 8.9–10.3)
Chloride: 106 mmol/L (ref 98–111)
Creatinine, Ser: 1.21 mg/dL — ABNORMAL HIGH (ref 0.44–1.00)
GFR, Estimated: 41 mL/min — ABNORMAL LOW (ref >60)
Glucose, Bld: 137 mg/dL — ABNORMAL HIGH (ref 70–99)
Potassium: 4.3 mmol/L (ref 3.5–5.1)
Sodium: 141 mmol/L (ref 135–145)

## 2022-04-22 LAB — POCT I-STAT, CHEM 8
BUN: 47 mg/dL — ABNORMAL HIGH (ref 8–23)
Calcium, Ion: 1.03 mmol/L — ABNORMAL LOW (ref 1.15–1.40)
Chloride: 101 mmol/L (ref 98–111)
Creatinine, Ser: 2.8 mg/dL — ABNORMAL HIGH (ref 0.44–1.00)
Glucose, Bld: 135 mg/dL — ABNORMAL HIGH (ref 70–99)
HCT: 39 % (ref 36.0–46.0)
Hemoglobin: 13.3 g/dL (ref 12.0–15.0)
Potassium: 7.1 mmol/L (ref 3.5–5.1)
Sodium: 134 mmol/L — ABNORMAL LOW (ref 135–145)
TCO2: 28 mmol/L (ref 22–32)

## 2022-04-22 MED ORDER — SPIRONOLACTONE 12.5 MG HALF TABLET
12.5000 mg | ORAL_TABLET | Freq: Every day | ORAL | Status: DC
Start: 2022-04-22 — End: 2022-04-24
  Administered 2022-04-22 – 2022-04-23 (×2): 12.5 mg via ORAL
  Filled 2022-04-22 (×3): qty 1

## 2022-04-22 MED ORDER — TORSEMIDE 20 MG PO TABS
10.0000 mg | ORAL_TABLET | Freq: Every day | ORAL | Status: DC
Start: 2022-04-22 — End: 2022-04-24
  Administered 2022-04-22 – 2022-04-23 (×2): 10 mg via ORAL
  Filled 2022-04-22 (×2): qty 1

## 2022-04-22 MED ORDER — QUETIAPINE FUMARATE 50 MG PO TABS
25.0000 mg | ORAL_TABLET | Freq: Every day | ORAL | Status: DC
  Administered 2022-04-22 – 2022-04-23 (×2): 25 mg via ORAL
  Filled 2022-04-22 (×3): qty 1

## 2022-04-22 NOTE — Progress Notes (Signed)
PROGRESS NOTE    SUE-ANNE VANKLEY  [id number] DOB: 1923/07/15 DOA: 04/16/2022 PCP: Mayra Neer, MD   Brief Narrative:  Patient is a 88 year old female with history of DM, CKD stage III, HTN, chronic systolic CHF EF of 30 to [id number] presented from [facility], ALF with a fall.  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  He had pushed her wheelchair out of the way to perform his duties.  He failed to put the wheelchair back next to her when he left.  Patient ported that she was trying to use her walker to get to the wheelchair and fell when trying to get into the wheelchair. Patient had been treated with laxatives for the last 2 days due to constipation and subsequently had massive amounts of diarrhea. Creatinine 2.6, BUN 30, potassium 6.0 Patient was admitted for further work-up.   6/3: Still having significant delirium. Switched scheduled norco to prn. Delirium precautions. Qtc prolonged, stopped seroquil, haldol. Restart lorsartan. Continue abx for UTI.   Assessment & Plan:   Principal Problem:   Acute renal failure superimposed on stage 3a chronic kidney disease (HCC) Active Problems:   Hyperkalemia   Type 2 diabetes mellitus with diabetic neuropathy, without long-term current use of insulin (HCC)   Essential hypertension   Atrial fibrillation (HCC)   GERD   Chronic combined systolic and diastolic congestive heart failure (HCC)   Stage 3a chronic kidney disease (CKD) (HCC) - baseline SCr 1.25-1.5   Hypothyroid   DNR (do not resuscitate)/DNI(Do No Intubate)   Acute kidney injury ([place])   Pressure injury of skin  Acute renal failure superimposed on stage 3a chronic kidney disease ([place]) Likely due to significant diarrhea from laxatives. Presented with creatinine of 2.6, plateaued at 2.8 now 1.07, baseline creatinine 1.2-1.5. torsemide and Aldactone on hold.  Will resume both of them.  UTI/asymptomatic bacteriuria: Patient  has been started on Rocephin for UTI however patient's UA appears to be contaminated, not impressive enough, urine culture negative as well.  I doubt it is a real UTI. Nonetheless, she has received 3 days of Rocephin with end date of 04/21/2022.   Acute metabolic encephalopathy/delirium: Superimposed on underlying dementia: I was informed earlier that patient was found to be confused this morning and having signs of delirium once again.  When I saw this patient about an hour later, patient was fully alert and oriented and patient also acknowledged that she was confused.  I will resume back on Seroquel 25 mg,  1. Avoid benzodiazepines, antihistamines, anticholinergics, and minimize opiate use as these may worsen delirium. 2: Assess, prevent and manage pain as lack of treatment can result in delirium.  3: Provide appropriate lighting and clear signage; a clock and calendar should be easily visible to the patient. 4: Monitor environmental factors. Reduce light and noise at night (close shades, turn off lights, turn off TV, ect). Correct any alterations in sleep cycle. 5: Reorient the patient to person, place, time and situation on each encounter.  6: Correct sensory deficits if possible (replace eye glasses, hearing aids, ect). 7: Avoid restraints if able. Severely delirious patients benefit from constant observation by a sitter.   Qtc prolongation Judicious with Qtc prolonging medications, all QTc prolonging medications were discontinued.  We will repeat EKG tomorrow.   Type 2 diabetes mellitus with diabetic neuropathy, without long-term current use of insulin (HCC) -Hemoglobin A1c 6.3 -Continue sliding scale insulin.  Blood sugar controlLED.  Essential hypertension: Fairly controlled.  Continue Coreg and losartan.  Resuming torsemide and Aldactone.  Paroxysmal atrial fibrillation?  This diagnosis is documented in her problem list however I reviewed several EKGs going back 3 years from now but not  even a single EKG has atrial fibrillation.  She is not on any anticoagulation either.  Unsure if she really has history of atrial fibrillation.  Continue Coreg.    GERD -Continue PPI    Chronic combined systolic and diastolic congestive heart failure ([place]) -2D echo May/2022 showed EF of 30 to 35% -Currently compensated, holding torsemide, Aldactone due to acute kidney injury.  Will resume tomorrow.   Hypothyroid -Continue Synthroid  DVT prophylaxis: heparin injection 5,000 Units Start: 04/17/22 0600 SCDs Start: 04/17/22 0236   Code Status: DNR  Family Communication:  None present at bedside.  Plan of care discussed with patient in length and he/she verbalized understanding and agreed with it.  Status is: Inpatient Remains inpatient appropriate because: Medically stable.  Needs SNF bed.  Awaiting placement.   Estimated body mass index is 28.76 kg/m as calculated from the following:   Height as of this encounter: 6' (1.829 m).   Weight as of this encounter: 96.2 kg.  Pressure Injury 04/21/22 Buttocks Right Stage 2 -  Partial thickness loss of dermis presenting as a shallow open injury with a red, pink wound bed without slough. (Active)  04/21/22 1449  Location: Buttocks  Location Orientation: Right  Staging: Stage 2 -  Partial thickness loss of dermis presenting as a shallow open injury with a red, pink wound bed without slough.  Wound Description (Comments):   Present on Admission:   Dressing Type Foam - Lift dressing to assess site every shift 04/21/22 2220   Nutritional Assessment: Body mass index is 28.76 kg/m.Marland Kitchen Seen by dietician.  I agree with the assessment and plan as outlined below: Nutrition Status:        . Skin Assessment: I have examined the patient's skin and I agree with the wound assessment as performed by the wound care RN as outlined below: Pressure Injury 04/21/22 Buttocks Right Stage 2 -  Partial thickness loss of dermis presenting as a shallow open  injury with a red, pink wound bed without slough. (Active)  04/21/22 1449  Location: Buttocks  Location Orientation: Right  Staging: Stage 2 -  Partial thickness loss of dermis presenting as a shallow open injury with a red, pink wound bed without slough.  Wound Description (Comments):   Present on Admission:   Dressing Type Foam - Lift dressing to assess site every shift 04/21/22 2220    Consultants:  None  Procedures:  None  Antimicrobials:  Anti-infectives (From admission, onward)    Start     Dose/Rate Route Frequency Ordered Stop   04/21/22 1200  cefTRIAXone (ROCEPHIN) 1 g in sodium chloride 0.9 % 100 mL IVPB  Status:  Discontinued        1 g 200 mL/hr over 30 Minutes Intravenous Every 24 hours 04/20/22 1337 04/21/22 1322   04/19/22 1100  cefTRIAXone (ROCEPHIN) 1 g in sodium chloride 0.9 % 100 mL IVPB  Status:  Discontinued        1 g 200 mL/hr over 30 Minutes Intravenous Every 24 hours 04/19/22 1012 04/20/22 1335         Subjective: Patient seen and examined.  She was fully alert and oriented and she had no complaints. Objective: Vitals:   04/21/22 2051 04/21/22 2251 04/22/22 [id number] 04/22/22 [id number]  BP: (!) 167/80  (!) 164/71 (!) 144/75  Pulse: 87 84 79 91  Resp: 18 16 18 16   Temp: 99.1 F (37.3 C)  98.7 F (37.1 C) 99.4 F (37.4 C)  TempSrc: Oral  Oral Oral  SpO2: 97% 97% 95% 98%  Weight:      Height:       No intake or output data in the 24 hours ending 04/22/22 1347  Filed Weights   04/16/22 2116 04/18/22 0500 04/21/22 0634  Weight: 96 kg 97.8 kg 96.2 kg    Examination:  General exam: Appears calm and comfortable  Respiratory system: Clear to auscultation. Respiratory effort normal. Cardiovascular system: S1 & S2 heard, RRR. No JVD, murmurs, rubs, gallops or clicks. No pedal edema. Gastrointestinal system: Abdomen is nondistended, soft and nontender. No organomegaly or masses felt. Normal bowel sounds heard. Central nervous system: Alert and oriented.  No focal neurological deficits. Extremities: Symmetric 5 x 5 power. Skin: No rashes, lesions or ulcers.  Psychiatry: Judgement and insight appear normal. Mood & affect appropriate.   Data Reviewed: I have personally reviewed following labs and imaging studies  CBC: Recent Labs  Lab 04/16/22 2237 04/16/22 2243 04/17/22 0626 04/18/22 0607 04/19/22 0417 04/21/22 0059  WBC 15.3*  --  13.3* 8.8 8.4 10.9*  NEUTROABS  --   --  10.3*  --   --   --   HGB 12.9 13.3  13.3 12.1 11.8* 12.0 11.6*  HCT 40.6 39.0  39.0 37.0 35.4* 37.1 34.9*  MCV 93.5  --  92.5 91.5 92.3 92.1  PLT 162  --  148* 134* 136* 141*    Basic Metabolic Panel: Recent Labs  Lab 04/17/22 0626 04/18/22 0607 04/19/22 0417 04/19/22 1018 04/20/22 0136 04/21/22 0059 04/22/22 0316  NA 139 138 140  --  144 139 141  K 3.6 3.4* 3.4*  --  4.1 3.9 4.3  CL 103 103 106  --  112* 109 106  CO2 26 29 25   --  22 25 24   GLUCOSE 124* 123* 130*  --  119* 135* 137*  BUN 29* 26* 27*  --  27* 24* 18  CREATININE 2.15* 1.57* 1.30*  --  1.26* 1.07* 1.21*  CALCIUM 8.9 8.8* 8.8*  --  8.6* 8.7* 9.1  MG 3.0*  --   --  2.6*  --   --   --     GFR: Estimated Creatinine Clearance: 34.5 mL/min (A) (by C-G formula based on SCr of 1.21 mg/dL (H)). Liver Function Tests: Recent Labs  Lab 04/16/22 2237 04/17/22 0626 04/19/22 1018  AST 103* 69* 43*  ALT 23 28 28   ALKPHOS 58 58 54  BILITOT 0.6 0.4 0.9  PROT 6.8 6.3* 6.6  ALBUMIN 3.6 3.3* 3.3*    No results for input(s): LIPASE, AMYLASE in the last 168 hours. Recent Labs  Lab 04/19/22 1018  AMMONIA 39*    Coagulation Profile: Recent Labs  Lab 04/16/22 2237  INR 1.2    Cardiac Enzymes: No results for input(s): CKTOTAL, CKMB, CKMBINDEX, TROPONINI in the last 168 hours. BNP (last 3 results) No results for input(s): PROBNP in the last 8760 hours. HbA1C: No results for input(s): HGBA1C in the last 72 hours. CBG: Recent Labs  Lab 04/21/22 1210 04/21/22 1702 04/21/22 2114  04/22/22 0725 04/22/22 1303  GLUCAP 184* 138* 146* 128* 128*    Lipid Profile: No results for input(s): CHOL, HDL, LDLCALC, TRIG, CHOLHDL, LDLDIRECT in the last 72 hours. Thyroid Function Tests: No  results for input(s): TSH, T4TOTAL, FREET4, T3FREE, THYROIDAB in the last 72 hours.  Anemia Panel: No results for input(s): VITAMINB12, FOLATE, FERRITIN, TIBC, IRON, RETICCTPCT in the last 72 hours.  Sepsis Labs: Recent Labs  Lab 04/16/22 2237  LATICACIDVEN 3.0*     Recent Results (from the past 240 hour(s))  Resp Panel by RT-PCR (Flu A&B, Covid) Anterior Nasal Swab     Status: None   Collection Time: 04/15/22 10:43 AM   Specimen: Anterior Nasal Swab  Result Value Ref Range Status   SARS Coronavirus 2 by RT PCR NEGATIVE NEGATIVE Final    Comment: (NOTE) SARS-CoV-2 target nucleic acids are NOT DETECTED.  The SARS-CoV-2 RNA is generally detectable in upper respiratory specimens during the acute phase of infection. The lowest concentration of SARS-CoV-2 viral copies this assay can detect is 138 copies/mL. A negative result does not preclude SARS-Cov-2 infection and should not be used as the sole basis for treatment or other patient management decisions. A negative result may occur with  improper specimen collection/handling, submission of specimen other than nasopharyngeal swab, presence of viral mutation(s) within the areas targeted by this assay, and inadequate number of viral copies(<138 copies/mL). A negative result must be combined with clinical observations, patient history, and epidemiological information. The expected result is Negative.  Fact Sheet for Patients:  BloggerCourse.com  Fact Sheet for Healthcare Providers:  SeriousBroker.it  This test is no t yet approved or cleared by the [place] FDA and  has been authorized for detection and/or diagnosis of SARS-CoV-2 by FDA under an Emergency Use Authorization  (EUA). This EUA will remain  in effect (meaning this test can be used) for the duration of the COVID-19 declaration under Section 564(b)(1) of the Act, 21 U.S.C.section 360bbb-3(b)(1), unless the authorization is terminated  or revoked sooner.       Influenza A by PCR NEGATIVE NEGATIVE Final   Influenza B by PCR NEGATIVE NEGATIVE Final    Comment: (NOTE) The Xpert Xpress SARS-CoV-2/FLU/RSV plus assay is intended as an aid in the diagnosis of influenza from Nasopharyngeal swab specimens and should not be used as a sole basis for treatment. Nasal washings and aspirates are unacceptable for Xpert Xpress SARS-CoV-2/FLU/RSV testing.  Fact Sheet for Patients: BloggerCourse.com  Fact Sheet for Healthcare Providers: SeriousBroker.it  This test is not yet approved or cleared by the [place] FDA and has been authorized for detection and/or diagnosis of SARS-CoV-2 by FDA under an Emergency Use Authorization (EUA). This EUA will remain in effect (meaning this test can be used) for the duration of the COVID-19 declaration under Section 564(b)(1) of the Act, 21 U.S.C. section 360bbb-3(b)(1), unless the authorization is terminated or revoked.  Performed at [facility], [address] [place], [place] [address]   Resp Panel by RT-PCR (Flu A&B, Covid) Anterior Nasal Swab     Status: None   Collection Time: 04/16/22 10:37 PM   Specimen: Anterior Nasal Swab  Result Value Ref Range Status   SARS Coronavirus 2 by RT PCR NEGATIVE NEGATIVE Final    Comment: (NOTE) SARS-CoV-2 target nucleic acids are NOT DETECTED.  The SARS-CoV-2 RNA is generally detectable in upper respiratory specimens during the acute phase of infection. The lowest concentration of SARS-CoV-2 viral copies this assay can detect is 138 copies/mL. A negative result does not preclude SARS-Cov-2 infection and should not be used as the sole basis for treatment or other  patient management decisions. A negative result may occur with  improper specimen collection/handling,  submission of specimen other than nasopharyngeal swab, presence of viral mutation(s) within the areas targeted by this assay, and inadequate number of viral copies(<138 copies/mL). A negative result must be combined with clinical observations, patient history, and epidemiological information. The expected result is Negative.  Fact Sheet for Patients:  EntrepreneurPulse.com.au  Fact Sheet for Healthcare Providers:  IncredibleEmployment.be  This test is no t yet approved or cleared by the [place] FDA and  has been authorized for detection and/or diagnosis of SARS-CoV-2 by FDA under an Emergency Use Authorization (EUA). This EUA will remain  in effect (meaning this test can be used) for the duration of the COVID-19 declaration under Section 564(b)(1) of the Act, 21 U.S.C.section 360bbb-3(b)(1), unless the authorization is terminated  or revoked sooner.       Influenza A by PCR NEGATIVE NEGATIVE Final   Influenza B by PCR NEGATIVE NEGATIVE Final    Comment: (NOTE) The Xpert Xpress SARS-CoV-2/FLU/RSV plus assay is intended as an aid in the diagnosis of influenza from Nasopharyngeal swab specimens and should not be used as a sole basis for treatment. Nasal washings and aspirates are unacceptable for Xpert Xpress SARS-CoV-2/FLU/RSV testing.  Fact Sheet for Patients: EntrepreneurPulse.com.au  Fact Sheet for Healthcare Providers: IncredibleEmployment.be  This test is not yet approved or cleared by the [place] FDA and has been authorized for detection and/or diagnosis of SARS-CoV-2 by FDA under an Emergency Use Authorization (EUA). This EUA will remain in effect (meaning this test can be used) for the duration of the COVID-19 declaration under Section 564(b)(1) of the Act, 21 U.S.C. section  360bbb-3(b)(1), unless the authorization is terminated or revoked.  Performed at [facility], [place] [address] [place], [place] [address]   Urine Culture     Status: None   Collection Time: 04/19/22 10:54 AM   Specimen: Urine, Clean Catch  Result Value Ref Range Status   Specimen Description URINE, CLEAN CATCH  Final   Special Requests NONE  Final   Culture   Final    NO GROWTH Performed at [facility], [place] [address] [place], [place] [address]    Report Status 04/20/2022 FINAL  Final      Radiology Studies: No results found.  Scheduled Meds:  aspirin  81 mg Oral Daily   buPROPion  150 mg Oral Daily   carvedilol  6.25 mg Oral BID   heparin  5,000 Units Subcutaneous Q8H   insulin aspart  0-5 Units Subcutaneous QHS   insulin aspart  0-9 Units Subcutaneous TID WC   levothyroxine  25 mcg Oral QAC breakfast   losartan  25 mg Oral Daily   mirabegron ER  25 mg Oral Daily   pantoprazole  40 mg Oral BID   QUEtiapine  25 mg Oral QHS   sertraline  200 mg Oral Daily   simvastatin  40 mg Oral QHS   Continuous Infusions:     LOS: 3 days   Darliss Cheney, MD [facility]  04/22/2022, 1:47 PM   *Please note that this is a verbal dictation therefore any spelling or grammatical errors are due to the "[place]" system interpretation.  Please page via [place] and do not message via secure chat for urgent patient care matters. Secure chat can be used for non urgent patient care matters.  How to contact the [facility] Attending or Consulting provider [place] or covering provider during after hours [place], for this patient?  Check the care team in  [facility] and look for a) attending/consulting TR[facility] provider listed and b) the [facility] team listed. Page or secure chat [phone]. Log into [email or web address] and use [place]'s universal password to access. If you do not have the password, please contact the hospital operator. Locate the [facility] provider you are looking for under [facility] and page to a number that you can be directly reached. If you still have difficulty reaching the provider, please page the [facility] (Director on Call) for the Hospitalists listed on amion for assistance.

## 2022-04-22 NOTE — TOC Progression Note (Signed)
Transition of Care [facility]) - Progression Note    Patient Details  Name: TRULEE HAMSTRA MRN: [id number] Date of Birth: 09-16-1923  Transition of Care [facility]) CM/SW Contact  Ivette Loyal, [place] Phone Number: 04/22/2022, 1:14 PM  Clinical Narrative:    Wille Celeste at [facility] will start auth today, pt is not navi managed.    Expected Discharge Plan: Assisted Living Barriers to Discharge: Continued Medical Work up  Expected Discharge Plan and Services Expected Discharge Plan: Assisted Living In-house Referral: Clinical Social Work   Post Acute Care Choice:  (ALF) Living arrangements for the past 2 months: Assisted Living Facility                                       Social Determinants of Health (SDOH) Interventions    Readmission Risk Interventions     View : No data to display.

## 2022-04-22 NOTE — Progress Notes (Signed)
Physical Therapy Treatment Patient Details Name: [name] MRN: [id number] DOB: 04/28/1923 Today's Date: 04/22/2022   History of Present Illness Patient is 88 y.o. female from [place] after a fall. Pt reports she was trying to use her walker to get to the wheelchair and fell when trying to get into the wheelchair. Imaging for LE, head, neck, and UE all negative for acute injury. PMH significant for Afib, DM, GERD, HLD, HTN, LBBB, Lt THA.    PT Comments    Pt continues therapy requiring Max +2 assist with bed mobility and transfers. Pt requires manual assist for LE positioning and to maintain upright posture with sit<>stands. Pt's cognition improved with less delirium and was agreeable/pleasant with therapy but is limited by pain and mobility deficits. Continue to recommend ST re[facility] at [facility]. Will continue to progress in acute as able.    Recommendations for follow up therapy are one component of a multi-disciplinary discharge planning process, led by the attending physician.  Recommendations may be updated based on patient status, additional functional criteria and insurance authorization.  Follow Up Recommendations  Skilled nursing-short term rehab (<3 hours/day)     Assistance Recommended at Discharge Frequent or constant Supervision/Assistance  Patient can return home with the following Two people to help with walking and/or transfers;Two people to help with bathing/dressing/bathroom;Direct supervision/assist for medications management;Assist for transportation;Help with stairs or ramp for entrance;Assistance with cooking/housework;Assistance with feeding;Direct supervision/assist for financial management   Equipment Recommendations  None recommended by PT    Recommendations for Other Services       Precautions / Restrictions Precautions Precautions: Fall Restrictions Weight Bearing Restrictions: No     Mobility  Bed Mobility Overal bed mobility: Needs  Assistance Bed Mobility: Rolling, Supine to Sit, Sit to Supine Rolling: +2 for physical assistance, Total assist   Supine to sit: +2 for physical assistance, +2 for safety/equipment, Total assist, HOB elevated Sit to supine: Total assist, +2 for physical assistance   General bed mobility comments: Pt required repeated verbal cues to pull up from handrail, could not swing legs to EOB, therapist assisted with LE's and trunk into upright position. Required +2 assist for rolling both ways.    Transfers       Sit to Stand: +2 physical assistance, +2 safety/equipment, From elevated surface, Total assist   Step pivot transfers: Total assist, +2 physical assistance       General transfer comment: Pt relied on +2 assist and RW to come to complete stand. Attempted 4x sit to stand, reported dizziness on the last attempt. Manual assist required to weight shift from Rt to Lt LE to facilitate lateral stepping along EOB. Pt unable to transfer to recliner d/t fatigue and dizziness.    Ambulation/Gait                   Stairs             Wheelchair Mobility    Modified Rankin (Stroke Patients Only)       Balance                                            Cognition Arousal/Alertness: Awake/alert Behavior During Therapy: WFL for tasks assessed/performed Overall Cognitive Status: No family/caregiver present to determine baseline cognitive functioning  Exercises      General Comments        Pertinent Vitals/Pain Pain Assessment Pain Assessment: 0-10 Pain Score: 8  Pain Location: Lower lumbar spine Pain Descriptors / Indicators: Discomfort Pain Intervention(s): Limited activity within patient's tolerance, Repositioned    Home Living                   Home Equipment: Rolling Walker (2 wheels)      Prior Function            PT Goals (current goals can now be found in the  care plan section) Acute Rehab PT Goals Patient Stated Goal: get back to ALF PT Goal Formulation: With patient Time For Goal Achievement: 05/02/22 Potential to Achieve Goals: Fair Progress towards PT goals: Progressing toward goals    Frequency    Min 2X/week      PT Plan Current plan remains appropriate    Co-evaluation              AM-PAC PT "6 Clicks" Mobility   Outcome Measure  Help needed turning from your back to your side while in a flat bed without using bedrails?: Total Help needed moving from lying on your back to sitting on the side of a flat bed without using bedrails?: Total Help needed moving to and from a bed to a chair (including a wheelchair)?: Total Help needed standing up from a chair using your arms (e.g., wheelchair or bedside chair)?: Total Help needed to walk in hospital room?: Total Help needed climbing 3-5 steps with a railing? : Total 6 Click Score: 6    End of Session Equipment Utilized During Treatment: Gait belt Activity Tolerance: Patient tolerated treatment well Patient left: in bed;with bed alarm set;with call bell/phone within reach (chair position) Nurse Communication: Mobility status (dizziness, wheezy/SOB, VSS) PT Visit Diagnosis: Unsteadiness on feet (R26.81);Other abnormalities of gait and mobility (R26.89);Muscle weakness (generalized) (M62.81);Difficulty in walking, not elsewhere classified (R26.2);History of falling (Z91.81)     Time: [id number] PT Time Calculation (min) (ACUTE ONLY): 36 min  Charges:  $Therapeutic Activity: 23-37 mins                     Margie Ege, SPT [place] 04/22/2022, 4:46 PM

## 2022-04-23 DIAGNOSIS — N170 Acute kidney failure with tubular necrosis: Secondary | ICD-10-CM | POA: Diagnosis not present

## 2022-04-23 DIAGNOSIS — N1831 Chronic kidney disease, stage 3a: Secondary | ICD-10-CM | POA: Diagnosis not present

## 2022-04-23 LAB — BASIC METABOLIC PANEL
Anion gap: 10 (ref 5–15)
BUN: 19 mg/dL (ref 8–23)
CO2: 25 mmol/L (ref 22–32)
Calcium: 9.2 mg/dL (ref 8.9–10.3)
Chloride: 106 mmol/L (ref 98–111)
Creatinine, Ser: 1.44 mg/dL — ABNORMAL HIGH (ref 0.44–1.00)
GFR, Estimated: 33 mL/min — ABNORMAL LOW (ref >60)
Glucose, Bld: 136 mg/dL — ABNORMAL HIGH (ref 70–99)
Potassium: 3.5 mmol/L (ref 3.5–5.1)
Sodium: 141 mmol/L (ref 135–145)

## 2022-04-23 LAB — GLUCOSE, CAPILLARY
Glucose-Capillary: 144 mg/dL — ABNORMAL HIGH (ref 70–99)
Glucose-Capillary: 142 mg/dL — ABNORMAL HIGH (ref 70–99)
Glucose-Capillary: 156 mg/dL — ABNORMAL HIGH (ref 70–99)
Glucose-Capillary: 163 mg/dL — ABNORMAL HIGH (ref 70–99)

## 2022-04-23 MED ORDER — BISACODYL 10 MG RE SUPP
10.0000 mg | Freq: Once | RECTAL | Status: AC
  Administered 2022-04-23: 10 mg via RECTAL
  Filled 2022-04-23: qty 1

## 2022-04-23 MED ORDER — HYDROCODONE-ACETAMINOPHEN 5-325 MG PO TABS: 1.0000 | ORAL_TABLET | Freq: Two times a day (BID) | ORAL | 0 refills | Status: AC | PRN

## 2022-04-23 MED ORDER — ALPRAZOLAM 1 MG PO TABS: 1.0000 mg | ORAL_TABLET | Freq: Three times a day (TID) | ORAL | 0 refills | Status: AC | PRN

## 2022-04-23 NOTE — Progress Notes (Signed)
PROGRESS NOTE    [name]  [id number] DOB: Mar 08, 1923 DOA: 04/16/2022 PCP: Mayra Neer, MD   Brief Narrative:  Patient is a 88 year old female with history of DM, CKD stage III, HTN, chronic systolic CHF EF of 30 to [id number] presented from [facility], ALF with a fall.  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  He had pushed her wheelchair out of the way to perform his duties.  He failed to put the wheelchair back next to her when he left.  Patient ported that she was trying to use her walker to get to the wheelchair and fell when trying to get into the wheelchair. Patient had been treated with laxatives for the last 2 days due to constipation and subsequently had massive amounts of diarrhea. Creatinine 2.6, BUN 30, potassium 6.0 Patient was admitted for further work-up.   6/3: Still having significant delirium. Switched scheduled norco to prn. Delirium precautions. Qtc prolonged, stopped seroquil, haldol. Restart lorsartan. Continue abx for UTI.   Assessment & Plan:   Principal Problem:   Acute renal failure superimposed on stage 3a chronic kidney disease (HCC) Active Problems:   Hyperkalemia   Type 2 diabetes mellitus with diabetic neuropathy, without long-term current use of insulin (HCC)   Essential hypertension   Atrial fibrillation (HCC)   GERD   Chronic combined systolic and diastolic congestive heart failure (HCC)   Stage 3a chronic kidney disease (CKD) (HCC) - baseline SCr 1.25-1.5   Hypothyroid   DNR (do not resuscitate)/DNI(Do No Intubate)   Acute kidney injury ([place])   Pressure injury of skin  Acute renal failure superimposed on stage 3a-3b chronic kidney disease ([place]) Likely due to significant diarrhea from laxatives. Presented with creatinine of 2.6, plateaued at 2.8 now 1.07, baseline creatinine 1.2-1.5.  UTI/asymptomatic bacteriuria: Patient has been started on Rocephin for UTI however patient's UA  appears to be contaminated, not impressive enough, urine culture negative as well.  I doubt it is a real UTI. Nonetheless, she has received 3 days of Rocephin with end date of 04/21/2022.   Acute metabolic encephalopathy/delirium: No further confusion.  She is fully alert and oriented.  Continue Seroquel 25 mg nightly,  1. Avoid benzodiazepines, antihistamines, anticholinergics, and minimize opiate use as these may worsen delirium. 2: Assess, prevent and manage pain as lack of treatment can result in delirium.  3: Provide appropriate lighting and clear signage; a clock and calendar should be easily visible to the patient. 4: Monitor environmental factors. Reduce light and noise at night (close shades, turn off lights, turn off TV, ect). Correct any alterations in sleep cycle. 5: Reorient the patient to person, place, time and situation on each encounter.  6: Correct sensory deficits if possible (replace eye glasses, hearing aids, ect). 7: Avoid restraints if able. Severely delirious patients benefit from constant observation by a sitter.   Qtc prolongation Very mild QTc pronation which is a stable.   Type 2 diabetes mellitus with diabetic neuropathy, without long-term current use of insulin (HCC) -Hemoglobin A1c 6.3 -Continue sliding scale insulin.  Blood sugar controlled.    Essential hypertension: Fairly controlled.  Continue Coreg and losartan.  Resuming torsemide and Aldactone.  Paroxysmal atrial fibrillation?  This diagnosis is documented in her problem list however I reviewed several EKGs going back 3 years from now but not even a single EKG has atrial fibrillation.  She is not on any anticoagulation either.  Unsure if she really has  history of atrial fibrillation.  Continue Coreg.    GERD -Continue PPI    Chronic combined systolic and diastolic congestive heart failure ([place]) -2D echo May/2022 showed EF of 30 to 35% -Currently compensated, holding torsemide, Aldactone due to acute  kidney injury.  Will resume tomorrow.   Hypothyroid -Continue Synthroid  DVT prophylaxis: heparin injection 5,000 Units Start: 04/17/22 0600 SCDs Start: 04/17/22 0236   Code Status: DNR  Family Communication:  None present at bedside.  Plan of care discussed with patient in length and he/she verbalized understanding and agreed with it.  Status is: Inpatient Remains inpatient appropriate because: Medically stable.  Needs SNF bed.  Awaiting placement.   Estimated body mass index is 28.76 kg/m as calculated from the following:   Height as of this encounter: 6' (1.829 m).   Weight as of this encounter: 96.2 kg.  Pressure Injury 04/21/22 Buttocks Right Stage 2 -  Partial thickness loss of dermis presenting as a shallow open injury with a red, pink wound bed without slough. (Active)  04/21/22 1449  Location: Buttocks  Location Orientation: Right  Staging: Stage 2 -  Partial thickness loss of dermis presenting as a shallow open injury with a red, pink wound bed without slough.  Wound Description (Comments):   Present on Admission:   Dressing Type Foam - Lift dressing to assess site every shift 04/22/22 2019   Nutritional Assessment: Body mass index is 28.76 kg/m.Marland Kitchen Seen by dietician.  I agree with the assessment and plan as outlined below: Nutrition Status:        . Skin Assessment: I have examined the patient's skin and I agree with the wound assessment as performed by the wound care RN as outlined below: Pressure Injury 04/21/22 Buttocks Right Stage 2 -  Partial thickness loss of dermis presenting as a shallow open injury with a red, pink wound bed without slough. (Active)  04/21/22 1449  Location: Buttocks  Location Orientation: Right  Staging: Stage 2 -  Partial thickness loss of dermis presenting as a shallow open injury with a red, pink wound bed without slough.  Wound Description (Comments):   Present on Admission:   Dressing Type Foam - Lift dressing to assess site  every shift 04/22/22 2019    Consultants:  None  Procedures:  None  Antimicrobials:  Anti-infectives (From admission, onward)    Start     Dose/Rate Route Frequency Ordered Stop   04/21/22 1200  cefTRIAXone (ROCEPHIN) 1 g in sodium chloride 0.9 % 100 mL IVPB  Status:  Discontinued        1 g 200 mL/hr over 30 Minutes Intravenous Every 24 hours 04/20/22 1337 04/21/22 1322   04/19/22 1100  cefTRIAXone (ROCEPHIN) 1 g in sodium chloride 0.9 % 100 mL IVPB  Status:  Discontinued        1 g 200 mL/hr over 30 Minutes Intravenous Every 24 hours 04/19/22 1012 04/20/22 1335         Subjective: Patient seen and examined.  She has no complaints.  She is fully alert and oriented today.  Objective: Vitals:   04/22/22 1746 04/22/22 2009 04/23/22 0509 04/23/22 0751  BP: (!) 129/97 (!) 144/78 (!) 99/40 112/78  Pulse: 89 86 89 77  Resp: 16 18 18 18   Temp: 99.1 F (37.3 C) (!) 97.3 F (36.3 C) 98.7 F (37.1 C) 98.5 F (36.9 C)  TempSrc: Oral Oral Oral Oral  SpO2: 94% 94% 99% 99%  Weight:      Height:  Intake/Output Summary (Last 24 hours) at 04/23/2022 1447 Last data filed at 04/23/2022 0000 Gross per 24 hour  Intake --  Output 1500 ml  Net -1500 ml    Filed Weights   04/16/22 2116 04/18/22 0500 04/21/22 0634  Weight: 96 kg 97.8 kg 96.2 kg    Examination:  General exam: Appears calm and comfortable  Respiratory system: Clear to auscultation. Respiratory effort normal. Cardiovascular system: S1 & S2 heard, RRR. No JVD, murmurs, rubs, gallops or clicks. No pedal edema. Gastrointestinal system: Abdomen is nondistended, soft and nontender. No organomegaly or masses felt. Normal bowel sounds heard. Central nervous system: Alert and oriented. No focal neurological deficits. Extremities: Symmetric 5 x 5 power. Skin: No rashes, lesions or ulcers.  Psychiatry: Judgement and insight appear normal. Mood & affect appropriate.   Data Reviewed: I have personally reviewed following  labs and imaging studies  CBC: Recent Labs  Lab 04/16/22 2237 04/16/22 2243 04/17/22 0626 04/18/22 0607 04/19/22 0417 04/21/22 0059  WBC 15.3*  --  13.3* 8.8 8.4 10.9*  NEUTROABS  --   --  10.3*  --   --   --   HGB 12.9 13.3  13.3 12.1 11.8* 12.0 11.6*  HCT 40.6 39.0  39.0 37.0 35.4* 37.1 34.9*  MCV 93.5  --  92.5 91.5 92.3 92.1  PLT 162  --  148* 134* 136* 141*    Basic Metabolic Panel: Recent Labs  Lab 04/17/22 0626 04/18/22 0607 04/19/22 0417 04/19/22 1018 04/20/22 0136 04/21/22 0059 04/22/22 0316 04/23/22 0048  NA 139   < > 140  --  144 139 141 141  K 3.6   < > 3.4*  --  4.1 3.9 4.3 3.5  CL 103   < > 106  --  112* 109 106 106  CO2 26   < > 25  --  22 25 24 25   GLUCOSE 124*   < > 130*  --  119* 135* 137* 136*  BUN 29*   < > 27*  --  27* 24* 18 19  CREATININE 2.15*   < > 1.30*  --  1.26* 1.07* 1.21* 1.44*  CALCIUM 8.9   < > 8.8*  --  8.6* 8.7* 9.1 9.2  MG 3.0*  --   --  2.6*  --   --   --   --    < > = values in this interval not displayed.    GFR: Estimated Creatinine Clearance: 29 mL/min (A) (by C-G formula based on SCr of 1.44 mg/dL (H)). Liver Function Tests: Recent Labs  Lab 04/16/22 2237 04/17/22 0626 04/19/22 1018  AST 103* 69* 43*  ALT 23 28 28   ALKPHOS 58 58 54  BILITOT 0.6 0.4 0.9  PROT 6.8 6.3* 6.6  ALBUMIN 3.6 3.3* 3.3*    No results for input(s): LIPASE, AMYLASE in the last 168 hours. Recent Labs  Lab 04/19/22 1018  AMMONIA 39*    Coagulation Profile: Recent Labs  Lab 04/16/22 2237  INR 1.2    Cardiac Enzymes: No results for input(s): CKTOTAL, CKMB, CKMBINDEX, TROPONINI in the last 168 hours. BNP (last 3 results) No results for input(s): PROBNP in the last 8760 hours. HbA1C: No results for input(s): HGBA1C in the last 72 hours. CBG: Recent Labs  Lab 04/22/22 1303 04/22/22 1645 04/22/22 2006 04/23/22 0822 04/23/22 1221  GLUCAP 128* 194* 121* 144* 142*    Lipid Profile: No results for input(s): CHOL, HDL,  LDLCALC, TRIG, CHOLHDL, LDLDIRECT in the last  72 hours. Thyroid Function Tests: No results for input(s): TSH, T4TOTAL, FREET4, T3FREE, THYROIDAB in the last 72 hours.  Anemia Panel: No results for input(s): VITAMINB12, FOLATE, FERRITIN, TIBC, IRON, RETICCTPCT in the last 72 hours.  Sepsis Labs: Recent Labs  Lab 04/16/22 2237  LATICACIDVEN 3.0*     Recent Results (from the past 240 hour(s))  Resp Panel by RT-PCR (Flu A&B, Covid) Anterior Nasal Swab     Status: None   Collection Time: 04/15/22 10:43 AM   Specimen: Anterior Nasal Swab  Result Value Ref Range Status   SARS Coronavirus 2 by RT PCR NEGATIVE NEGATIVE Final    Comment: (NOTE) SARS-CoV-2 target nucleic acids are NOT DETECTED.  The SARS-CoV-2 RNA is generally detectable in upper respiratory specimens during the acute phase of infection. The lowest concentration of SARS-CoV-2 viral copies this assay can detect is 138 copies/mL. A negative result does not preclude SARS-Cov-2 infection and should not be used as the sole basis for treatment or other patient management decisions. A negative result may occur with  improper specimen collection/handling, submission of specimen other than nasopharyngeal swab, presence of viral mutation(s) within the areas targeted by this assay, and inadequate number of viral copies(<138 copies/mL). A negative result must be combined with clinical observations, patient history, and epidemiological information. The expected result is Negative.  Fact Sheet for Patients:  EntrepreneurPulse.com.au  Fact Sheet for Healthcare Providers:  IncredibleEmployment.be  This test is no t yet approved or cleared by the [place] FDA and  has been authorized for detection and/or diagnosis of SARS-CoV-2 by FDA under an Emergency Use Authorization (EUA). This EUA will remain  in effect (meaning this test can be used) for the duration of the COVID-19 declaration under  Section 564(b)(1) of the Act, 21 U.S.C.section 360bbb-3(b)(1), unless the authorization is terminated  or revoked sooner.       Influenza A by PCR NEGATIVE NEGATIVE Final   Influenza B by PCR NEGATIVE NEGATIVE Final    Comment: (NOTE) The Xpert Xpress SARS-CoV-2/FLU/RSV plus assay is intended as an aid in the diagnosis of influenza from Nasopharyngeal swab specimens and should not be used as a sole basis for treatment. Nasal washings and aspirates are unacceptable for Xpert Xpress SARS-CoV-2/FLU/RSV testing.  Fact Sheet for Patients: EntrepreneurPulse.com.au  Fact Sheet for Healthcare Providers: IncredibleEmployment.be  This test is not yet approved or cleared by the [place] FDA and has been authorized for detection and/or diagnosis of SARS-CoV-2 by FDA under an Emergency Use Authorization (EUA). This EUA will remain in effect (meaning this test can be used) for the duration of the COVID-19 declaration under Section 564(b)(1) of the Act, 21 U.S.C. section 360bbb-3(b)(1), unless the authorization is terminated or revoked.  Performed at [facility], [place] [address] [place], [place] [address]   Resp Panel by RT-PCR (Flu A&B, Covid) Anterior Nasal Swab     Status: None   Collection Time: 04/16/22 10:37 PM   Specimen: Anterior Nasal Swab  Result Value Ref Range Status   SARS Coronavirus 2 by RT PCR NEGATIVE NEGATIVE Final    Comment: (NOTE) SARS-CoV-2 target nucleic acids are NOT DETECTED.  The SARS-CoV-2 RNA is generally detectable in upper respiratory specimens during the acute phase of infection. The lowest concentration of SARS-CoV-2 viral copies this assay can detect is 138 copies/mL. A negative result does not preclude SARS-Cov-2 infection and should not be used as the sole basis for treatment or other patient management decisions. A negative result may occur  with  improper specimen collection/handling, submission of  specimen other than nasopharyngeal swab, presence of viral mutation(s) within the areas targeted by this assay, and inadequate number of viral copies(<138 copies/mL). A negative result must be combined with clinical observations, patient history, and epidemiological information. The expected result is Negative.  Fact Sheet for Patients:  EntrepreneurPulse.com.au  Fact Sheet for Healthcare Providers:  IncredibleEmployment.be  This test is no t yet approved or cleared by the [place] FDA and  has been authorized for detection and/or diagnosis of SARS-CoV-2 by FDA under an Emergency Use Authorization (EUA). This EUA will remain  in effect (meaning this test can be used) for the duration of the COVID-19 declaration under Section 564(b)(1) of the Act, 21 U.S.C.section 360bbb-3(b)(1), unless the authorization is terminated  or revoked sooner.       Influenza A by PCR NEGATIVE NEGATIVE Final   Influenza B by PCR NEGATIVE NEGATIVE Final    Comment: (NOTE) The Xpert Xpress SARS-CoV-2/FLU/RSV plus assay is intended as an aid in the diagnosis of influenza from Nasopharyngeal swab specimens and should not be used as a sole basis for treatment. Nasal washings and aspirates are unacceptable for Xpert Xpress SARS-CoV-2/FLU/RSV testing.  Fact Sheet for Patients: EntrepreneurPulse.com.au  Fact Sheet for Healthcare Providers: IncredibleEmployment.be  This test is not yet approved or cleared by the [place] FDA and has been authorized for detection and/or diagnosis of SARS-CoV-2 by FDA under an Emergency Use Authorization (EUA). This EUA will remain in effect (meaning this test can be used) for the duration of the COVID-19 declaration under Section 564(b)(1) of the Act, 21 U.S.C. section 360bbb-3(b)(1), unless the authorization is terminated or revoked.  Performed at [facility], [place] [address]  [place], [place] [address]   Urine Culture     Status: None   Collection Time: 04/19/22 10:54 AM   Specimen: Urine, Clean Catch  Result Value Ref Range Status   Specimen Description URINE, CLEAN CATCH  Final   Special Requests NONE  Final   Culture   Final    NO GROWTH Performed at [facility], [place] [address], [place], [place] [address]    Report Status 04/20/2022 FINAL  Final      Radiology Studies: No results found.  Scheduled Meds:  aspirin  81 mg Oral Daily   buPROPion  150 mg Oral Daily   carvedilol  6.25 mg Oral BID   heparin  5,000 Units Subcutaneous Q8H   insulin aspart  0-5 Units Subcutaneous QHS   insulin aspart  0-9 Units Subcutaneous TID WC   levothyroxine  25 mcg Oral QAC breakfast   losartan  25 mg Oral Daily   mirabegron ER  25 mg Oral Daily   pantoprazole  40 mg Oral BID   QUEtiapine  25 mg Oral QHS   sertraline  200 mg Oral Daily   simvastatin  40 mg Oral QHS   spironolactone  12.5 mg Oral Daily   torsemide  10 mg Oral Daily   Continuous Infusions:     LOS: 4 days   Darliss Cheney, MD [facility]  04/23/2022, 2:47 PM   *Please note that this is a verbal dictation therefore any spelling or grammatical errors are due to the "[employer]" system interpretation.  Please page via [facility] and do not message via secure chat for urgent patient care matters. Secure chat can be used for non urgent patient care matters.  How to contact the [facility] Attending or  Consulting provider [place] or covering provider during after hours [place], for this patient?  Check the care team in [facility] and look for a) attending/consulting [facility] provider listed and b) the [facility] team listed. Page or secure chat [phone]. Log into [email or web address] and use [facility]'s universal password to access. If you do not have the password, please contact the hospital operator. Locate the [facility] provider you are looking for under [facility] and page to a number that you can be directly  reached. If you still have difficulty reaching the provider, please page the [facility] (Director on Call) for the Hospitalists listed on amion for assistance.

## 2022-04-23 NOTE — Progress Notes (Signed)
Patient refused all vitals

## 2022-04-24 DIAGNOSIS — N1831 Chronic kidney disease, stage 3a: Secondary | ICD-10-CM | POA: Diagnosis not present

## 2022-04-24 DIAGNOSIS — N170 Acute kidney failure with tubular necrosis: Secondary | ICD-10-CM | POA: Diagnosis not present

## 2022-04-24 LAB — BASIC METABOLIC PANEL
Anion gap: 15 (ref 5–15)
BUN: 28 mg/dL — ABNORMAL HIGH (ref 8–23)
CO2: 25 mmol/L (ref 22–32)
Calcium: 9.1 mg/dL (ref 8.9–10.3)
Chloride: 100 mmol/L (ref 98–111)
Creatinine, Ser: 1.64 mg/dL — ABNORMAL HIGH (ref 0.44–1.00)
GFR, Estimated: 28 mL/min — ABNORMAL LOW (ref >60)
Glucose, Bld: 109 mg/dL — ABNORMAL HIGH (ref 70–99)
Potassium: 3.5 mmol/L (ref 3.5–5.1)
Sodium: 140 mmol/L (ref 135–145)

## 2022-04-24 LAB — GLUCOSE, CAPILLARY
Glucose-Capillary: 136 mg/dL — ABNORMAL HIGH (ref 70–99)
Glucose-Capillary: 148 mg/dL — ABNORMAL HIGH (ref 70–99)
Glucose-Capillary: 149 mg/dL — ABNORMAL HIGH (ref 70–99)

## 2022-04-24 NOTE — Care Management Important Message (Signed)
Important Message  Patient Details  Name: [name] MRN: [id number] Date of Birth: January 02, 1923   Medicare Important Message Given:  Yes     Orbie Pyo 04/24/2022, 3:47 PM

## 2022-04-24 NOTE — Progress Notes (Signed)
Patient refused all vitals

## 2022-04-24 NOTE — Progress Notes (Signed)
Pt refused all her night meds including her heparin sq, vital signs check and blood sugar check tonight. Johann Capers NP informed and aware. Pt alert to self, place and time, disoriented to situation and appears to have poor judgement. Pt educated of the risks of refusing care but gets agitated when encouraged to participate. Will continue to monitor pt.

## 2022-04-24 NOTE — Progress Notes (Signed)
PROGRESS NOTE    [name]  [id number] DOB: 02/15/1923 DOA: 04/16/2022 PCP: Mayra Neer, MD   Brief Narrative:  Patient is a 88 year old female with history of DM, CKD stage III, HTN, chronic systolic CHF EF of 30 to [id number] presented from [facility], ALF with a fall.  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  He had pushed her wheelchair out of the way to perform his duties.  He failed to put the wheelchair back next to her when he left.  Patient ported that she was trying to use her walker to get to the wheelchair and fell when trying to get into the wheelchair. Patient had been treated with laxatives for the last 2 days due to constipation and subsequently had massive amounts of diarrhea. Creatinine 2.6, BUN 30, potassium 6.0 Patient was admitted for further work-up.   6/3: Still having significant delirium. Switched scheduled norco to prn. Delirium precautions. Qtc prolonged, stopped seroquil, haldol. Restart lorsartan. Continue abx for UTI.   Assessment & Plan:   Principal Problem:   Acute renal failure superimposed on stage 3a chronic kidney disease (HCC) Active Problems:   Hyperkalemia   Type 2 diabetes mellitus with diabetic neuropathy, without long-term current use of insulin (HCC)   Essential hypertension   Atrial fibrillation (HCC)   GERD   Chronic combined systolic and diastolic congestive heart failure (HCC)   Stage 3a chronic kidney disease (CKD) (HCC) - baseline SCr 1.25-1.5   Hypothyroid   DNR (do not resuscitate)/DNI(Do No Intubate)   Acute kidney injury ([place])   Pressure injury of skin  Acute renal failure superimposed on stage 3a-3b chronic kidney disease ([place]) Likely due to significant diarrhea from laxatives. Presented with creatinine of 2.6, plateaued at 2.8 now 1.07, baseline creatinine 1.2-1.5.  UTI/asymptomatic bacteriuria: Patient has been started on Rocephin for UTI however patient's UA  appears to be contaminated, not impressive enough, urine culture negative as well.  I doubt it is a real UTI. Nonetheless, she has received 3 days of Rocephin with end date of 04/21/2022.   Acute metabolic encephalopathy/delirium: No further confusion.  She is slightly sleepy today however she is still oriented.  Continue Seroquel 25 mg nightly,  1. Avoid benzodiazepines, antihistamines, anticholinergics, and minimize opiate use as these may worsen delirium. 2: Assess, prevent and manage pain as lack of treatment can result in delirium.  3: Provide appropriate lighting and clear signage; a clock and calendar should be easily visible to the patient. 4: Monitor environmental factors. Reduce light and noise at night (close shades, turn off lights, turn off TV, ect). Correct any alterations in sleep cycle. 5: Reorient the patient to person, place, time and situation on each encounter.  6: Correct sensory deficits if possible (replace eye glasses, hearing aids, ect). 7: Avoid restraints if able. Severely delirious patients benefit from constant observation by a sitter.   Qtc prolongation Very mild QTc pronation which is a stable.   Type 2 diabetes mellitus with diabetic neuropathy, without long-term current use of insulin (HCC) -Hemoglobin A1c 6.3 -Continue sliding scale insulin.  Blood sugar controlled.    Essential hypertension: Fairly controlled.  Continue Coreg and losartan.  Resuming torsemide and Aldactone.  Paroxysmal atrial fibrillation?  This diagnosis is documented in her problem list however I reviewed several EKGs going back 3 years from now but not even a single EKG has atrial fibrillation.  She is not on any anticoagulation either.  Unsure  if she really has history of atrial fibrillation.  Continue Coreg.    GERD -Continue PPI    Chronic combined systolic and diastolic congestive heart failure ([place]) -2D echo May/2022 showed EF of 30 to 35% -Currently compensated, holding torsemide,  Aldactone due to acute kidney injury.  Will resume tomorrow.   Hypothyroid -Continue Synthroid  Disposition: Awaiting SNF placement.  I had received a message from [facility] that the case has gone to peer to peer.  I was provided the phone number to call.  I spoke to the representative at [employer] and I was told that the patient's information will be forwarded to their MD tomorrow and they will review the case and they will call me if they will need any further information.  No decision is going to be made today.  DVT prophylaxis: heparin injection 5,000 Units Start: 04/17/22 0600 SCDs Start: 04/17/22 0236   Code Status: DNR  Family Communication:  None present at bedside.  Plan of care discussed with patient in length and he/she verbalized understanding and agreed with it.  Status is: Inpatient Remains inpatient appropriate because: Medically stable.  Needs SNF bed.  Awaiting placement/insurance authorization.   Estimated body mass index is 27.63 kg/m as calculated from the following:   Height as of this encounter: 6' (1.829 m).   Weight as of this encounter: 92.4 kg.  Pressure Injury 04/21/22 Buttocks Right Stage 2 -  Partial thickness loss of dermis presenting as a shallow open injury with a red, pink wound bed without slough. (Active)  04/21/22 1449  Location: Buttocks  Location Orientation: Right  Staging: Stage 2 -  Partial thickness loss of dermis presenting as a shallow open injury with a red, pink wound bed without slough.  Wound Description (Comments):   Present on Admission:   Dressing Type Foam - Lift dressing to assess site every shift 04/24/22 0845   Nutritional Assessment: Body mass index is 27.63 kg/m.Marland Kitchen Seen by dietician.  I agree with the assessment and plan as outlined below: Nutrition Status:        . Skin Assessment: I have examined the patient's skin and I agree with the wound assessment as performed by the wound care RN as outlined  below: Pressure Injury 04/21/22 Buttocks Right Stage 2 -  Partial thickness loss of dermis presenting as a shallow open injury with a red, pink wound bed without slough. (Active)  04/21/22 1449  Location: Buttocks  Location Orientation: Right  Staging: Stage 2 -  Partial thickness loss of dermis presenting as a shallow open injury with a red, pink wound bed without slough.  Wound Description (Comments):   Present on Admission:   Dressing Type Foam - Lift dressing to assess site every shift 04/24/22 0845    Consultants:  None  Procedures:  None  Antimicrobials:  Anti-infectives (From admission, onward)    Start     Dose/Rate Route Frequency Ordered Stop   04/21/22 1200  cefTRIAXone (ROCEPHIN) 1 g in sodium chloride 0.9 % 100 mL IVPB  Status:  Discontinued        1 g 200 mL/hr over 30 Minutes Intravenous Every 24 hours 04/20/22 1337 04/21/22 1322   04/19/22 1100  cefTRIAXone (ROCEPHIN) 1 g in sodium chloride 0.9 % 100 mL IVPB  Status:  Discontinued        1 g 200 mL/hr over 30 Minutes Intravenous Every 24 hours 04/19/22 1012 04/20/22 1335         Subjective: Seen and  examined.  Patient slightly sleepy today.  Otherwise oriented and able to talk.  No complaints.  Objective: Vitals:   04/23/22 2247 04/24/22 0500 04/24/22 0546 04/24/22 0905  BP: (!) 155/72  (!) 143/75 (!) 156/77  Pulse: 85  80 87  Resp: 16  17 19   Temp: 97.7 F (36.5 C)  98.2 F (36.8 C)   TempSrc: Oral  Oral   SpO2: 93%  98% 97%  Weight:  92.4 kg    Height:        Intake/Output Summary (Last 24 hours) at 04/24/2022 1415 Last data filed at 04/23/2022 1835 Gross per 24 hour  Intake 120 ml  Output 100 ml  Net 20 ml    Filed Weights   04/18/22 0500 04/21/22 0634 04/24/22 0500  Weight: 97.8 kg 96.2 kg 92.4 kg    Examination:  General exam: Appears calm and comfortable  Respiratory system: Clear to auscultation. Respiratory effort normal. Cardiovascular system: S1 & S2 heard, RRR. No JVD, murmurs,  rubs, gallops or clicks. No pedal edema. Gastrointestinal system: Abdomen is nondistended, soft and nontender. No organomegaly or masses felt. Normal bowel sounds heard. Central nervous system: Slightly sleepy but is still fully oriented. No focal neurological deficits. Extremities: Symmetric 5 x 5 power. Skin: No rashes, lesions or ulcers.   Data Reviewed: I have personally reviewed following labs and imaging studies  CBC: Recent Labs  Lab 04/18/22 0607 04/19/22 0417 04/21/22 0059  WBC 8.8 8.4 10.9*  HGB 11.8* 12.0 11.6*  HCT 35.4* 37.1 34.9*  MCV 91.5 92.3 92.1  PLT 134* 136* 141*    Basic Metabolic Panel: Recent Labs  Lab 04/19/22 1018 04/20/22 0136 04/21/22 0059 04/22/22 0316 04/23/22 0048 04/24/22 0138  NA  --  144 139 141 141 140  K  --  4.1 3.9 4.3 3.5 3.5  CL  --  112* 109 106 106 100  CO2  --  22 25 24 25 25   GLUCOSE  --  119* 135* 137* 136* 109*  BUN  --  27* 24* 18 19 28*  CREATININE  --  1.26* 1.07* 1.21* 1.44* 1.64*  CALCIUM  --  8.6* 8.7* 9.1 9.2 9.1  MG 2.6*  --   --   --   --   --     GFR: Estimated Creatinine Clearance: 25 mL/min (A) (by C-G formula based on SCr of 1.64 mg/dL (H)). Liver Function Tests: Recent Labs  Lab 04/19/22 1018  AST 43*  ALT 28  ALKPHOS 54  BILITOT 0.9  PROT 6.6  ALBUMIN 3.3*    No results for input(s): LIPASE, AMYLASE in the last 168 hours. Recent Labs  Lab 04/19/22 1018  AMMONIA 39*    Coagulation Profile: No results for input(s): INR, PROTIME in the last 168 hours.  Cardiac Enzymes: No results for input(s): CKTOTAL, CKMB, CKMBINDEX, TROPONINI in the last 168 hours. BNP (last 3 results) No results for input(s): PROBNP in the last 8760 hours. HbA1C: No results for input(s): HGBA1C in the last 72 hours. CBG: Recent Labs  Lab 04/23/22 1221 04/23/22 1522 04/23/22 2240 04/24/22 0906 04/24/22 1205  GLUCAP 142* 156* 163* 136* 148*    Lipid Profile: No results for input(s): CHOL, HDL, LDLCALC, TRIG,  CHOLHDL, LDLDIRECT in the last 72 hours. Thyroid Function Tests: No results for input(s): TSH, T4TOTAL, FREET4, T3FREE, THYROIDAB in the last 72 hours.  Anemia Panel: No results for input(s): VITAMINB12, FOLATE, FERRITIN, TIBC, IRON, RETICCTPCT in the last 72 hours.  Sepsis Labs:  No results for input(s): PROCALCITON, LATICACIDVEN in the last 168 hours.   Recent Results (from the past 240 hour(s))  Resp Panel by RT-PCR (Flu A&B, Covid) Anterior Nasal Swab     Status: None   Collection Time: 04/15/22 10:43 AM   Specimen: Anterior Nasal Swab  Result Value Ref Range Status   SARS Coronavirus 2 by RT PCR NEGATIVE NEGATIVE Final    Comment: (NOTE) SARS-CoV-2 target nucleic acids are NOT DETECTED.  The SARS-CoV-2 RNA is generally detectable in upper respiratory specimens during the acute phase of infection. The lowest concentration of SARS-CoV-2 viral copies this assay can detect is 138 copies/mL. A negative result does not preclude SARS-Cov-2 infection and should not be used as the sole basis for treatment or other patient management decisions. A negative result may occur with  improper specimen collection/handling, submission of specimen other than nasopharyngeal swab, presence of viral mutation(s) within the areas targeted by this assay, and inadequate number of viral copies(<138 copies/mL). A negative result must be combined with clinical observations, patient history, and epidemiological information. The expected result is Negative.  Fact Sheet for Patients:  EntrepreneurPulse.com.au  Fact Sheet for Healthcare Providers:  IncredibleEmployment.be  This test is no t yet approved or cleared by the [place] FDA and  has been authorized for detection and/or diagnosis of SARS-CoV-2 by FDA under an Emergency Use Authorization (EUA). This EUA will remain  in effect (meaning this test can be used) for the duration of the COVID-19 declaration  under Section 564(b)(1) of the Act, 21 U.S.C.section 360bbb-3(b)(1), unless the authorization is terminated  or revoked sooner.       Influenza A by PCR NEGATIVE NEGATIVE Final   Influenza B by PCR NEGATIVE NEGATIVE Final    Comment: (NOTE) The Xpert Xpress SARS-CoV-2/FLU/RSV plus assay is intended as an aid in the diagnosis of influenza from Nasopharyngeal swab specimens and should not be used as a sole basis for treatment. Nasal washings and aspirates are unacceptable for Xpert Xpress SARS-CoV-2/FLU/RSV testing.  Fact Sheet for Patients: EntrepreneurPulse.com.au  Fact Sheet for Healthcare Providers: IncredibleEmployment.be  This test is not yet approved or cleared by the [place] FDA and has been authorized for detection and/or diagnosis of SARS-CoV-2 by FDA under an Emergency Use Authorization (EUA). This EUA will remain in effect (meaning this test can be used) for the duration of the COVID-19 declaration under Section 564(b)(1) of the Act, 21 U.S.C. section 360bbb-3(b)(1), unless the authorization is terminated or revoked.  Performed at [facility], [place] [address] [place], [place] [address]   Resp Panel by RT-PCR (Flu A&B, Covid) Anterior Nasal Swab     Status: None   Collection Time: 04/16/22 10:37 PM   Specimen: Anterior Nasal Swab  Result Value Ref Range Status   SARS Coronavirus 2 by RT PCR NEGATIVE NEGATIVE Final    Comment: (NOTE) SARS-CoV-2 target nucleic acids are NOT DETECTED.  The SARS-CoV-2 RNA is generally detectable in upper respiratory specimens during the acute phase of infection. The lowest concentration of SARS-CoV-2 viral copies this assay can detect is 138 copies/mL. A negative result does not preclude SARS-Cov-2 infection and should not be used as the sole basis for treatment or other patient management decisions. A negative result may occur with  improper specimen collection/handling, submission of  specimen other than nasopharyngeal swab, presence of viral mutation(s) within the areas targeted by this assay, and inadequate number of viral copies(<138 copies/mL). A negative result must be combined with clinical observations,  patient history, and epidemiological information. The expected result is Negative.  Fact Sheet for Patients:  EntrepreneurPulse.com.au  Fact Sheet for Healthcare Providers:  IncredibleEmployment.be  This test is no t yet approved or cleared by the [place] FDA and  has been authorized for detection and/or diagnosis of SARS-CoV-2 by FDA under an Emergency Use Authorization (EUA). This EUA will remain  in effect (meaning this test can be used) for the duration of the COVID-19 declaration under Section 564(b)(1) of the Act, 21 U.S.C.section 360bbb-3(b)(1), unless the authorization is terminated  or revoked sooner.       Influenza A by PCR NEGATIVE NEGATIVE Final   Influenza B by PCR NEGATIVE NEGATIVE Final    Comment: (NOTE) The Xpert Xpress SARS-CoV-2/FLU/RSV plus assay is intended as an aid in the diagnosis of influenza from Nasopharyngeal swab specimens and should not be used as a sole basis for treatment. Nasal washings and aspirates are unacceptable for Xpert Xpress SARS-CoV-2/FLU/RSV testing.  Fact Sheet for Patients: EntrepreneurPulse.com.au  Fact Sheet for Healthcare Providers: IncredibleEmployment.be  This test is not yet approved or cleared by the [place] FDA and has been authorized for detection and/or diagnosis of SARS-CoV-2 by FDA under an Emergency Use Authorization (EUA). This EUA will remain in effect (meaning this test can be used) for the duration of the COVID-19 declaration under Section 564(b)(1) of the Act, 21 U.S.C. section 360bbb-3(b)(1), unless the authorization is terminated or revoked.  Performed at [facility], [place] [address],  [place], [place] [address]   Urine Culture     Status: None   Collection Time: 04/19/22 10:54 AM   Specimen: Urine, Clean Catch  Result Value Ref Range Status   Specimen Description URINE, CLEAN CATCH  Final   Special Requests NONE  Final   Culture   Final    NO GROWTH Performed at [facility], [place] [address], [place], [place] [address]    Report Status 04/20/2022 FINAL  Final      Radiology Studies: No results found.  Scheduled Meds:  aspirin  81 mg Oral Daily   buPROPion  150 mg Oral Daily   carvedilol  6.25 mg Oral BID   heparin  5,000 Units Subcutaneous Q8H   insulin aspart  0-5 Units Subcutaneous QHS   insulin aspart  0-9 Units Subcutaneous TID WC   levothyroxine  25 mcg Oral QAC breakfast   mirabegron ER  25 mg Oral Daily   pantoprazole  40 mg Oral BID   QUEtiapine  25 mg Oral QHS   sertraline  200 mg Oral Daily   simvastatin  40 mg Oral QHS   Continuous Infusions:     LOS: 5 days   Darliss Cheney, MD [facility]  04/24/2022, 2:15 PM   *Please note that this is a verbal dictation therefore any spelling or grammatical errors are due to the "[employer]" system interpretation.  Please page via [place] and do not message via secure chat for urgent patient care matters. Secure chat can be used for non urgent patient care matters.  How to contact the [facility] Attending or Consulting provider [place] or covering provider during after hours [place], for this patient?  Check the care team in [facility] and look for a) attending/consulting TR[facility] provider listed and b) the [facility] team listed. Page or secure chat [phone]. Log into [email or web address] and use [place]'s universal password to access. If you do not have the password, please contact the hospital  operator. Locate the [facility] provider you are looking for under [facility] and page to a number that you can be directly reached. If you still have difficulty reaching the provider, please page the [facility] (Director on Call) for  the Hospitalists listed on amion for assistance.

## 2022-04-24 NOTE — Progress Notes (Signed)
Occupational Therapy Treatment Patient Details Name: [name] MRN: [id number] DOB: 11-Jun-1923 Today's Date: 04/24/2022   History of present illness Patient is 88 y.o. female from [place] after a fall. Pt reports she was trying to use her walker to get to the wheelchair and fell when trying to get into the wheelchair. Imaging for LE, head, neck, and UE all negative for acute injury. PMH significant for Afib, DM, GERD, HLD, HTN, LBBB, Lt THA.   OT comments  [name] is making functional progress, she was able to get the EOB with mod A this session and tolerated sitting for >10 minutes with intermittent min A for balance. Pt is limited by impaired cognition and required frequent cues for attention, sequencing and problem solving. Pt able to get back into bed with min A. Once in bed, total A peri care provided. Pt continues to benefit. D/c recommendation remains appropriate    Recommendations for follow up therapy are one component of a multi-disciplinary discharge planning process, led by the attending physician.  Recommendations may be updated based on patient status, additional functional criteria and insurance authorization.    Follow Up Recommendations  Skilled nursing-short term rehab (<3 hours/day)    Assistance Recommended at Discharge Frequent or constant Supervision/Assistance  Patient can return home with the following  Two people to help with walking and/or transfers;Two people to help with bathing/dressing/bathroom;Assistance with cooking/housework;Direct supervision/assist for medications management;Direct supervision/assist for financial management;Assist for transportation;Help with stairs or ramp for entrance   Equipment Recommendations  Other (comment)       Precautions / Restrictions Precautions Precautions: Fall Restrictions Weight Bearing Restrictions: No       Mobility Bed Mobility Overal bed mobility: Needs Assistance Bed Mobility: Supine to  Sit, Sit to Supine     Supine to sit: Mod assist Sit to supine: Min assist   General bed mobility comments: increased time anc cues given    Transfers Overall transfer level: Needs assistance                 General transfer comment: pt declined     Balance Overall balance assessment: History of Falls, Needs assistance Sitting-balance support: Feet supported, Bilateral upper extremity supported Sitting balance-Leahy Scale: Poor                                     ADL either performed or assessed with clinical judgement   ADL Overall ADL's : Needs assistance/impaired     Grooming: Set up;Sitting Grooming Details (indicate cue type and reason): intermittent min A for sitting balance                     Toileting- Clothing Manipulation and Hygiene: Total assistance;Bed level Toileting - Clothing Manipulation Details (indicate cue type and reason): total A for peri hygiene at bed level; pt wet upon arrival     Functional mobility during ADLs: Moderate assistance General ADL Comments: pt agreeable to sit EOB for grooming, mod A with cues to sit, and intermittent min A for sitting balanace    Extremity/Trunk Assessment Upper Extremity Assessment Upper Extremity Assessment: Generalized weakness (limited over head ROM)   Lower Extremity Assessment Lower Extremity Assessment: Defer to PT evaluation        Vision   Vision Assessment?: No apparent visual deficits   Perception Perception Perception: Not tested   Praxis Praxis Praxis: Not tested  Cognition Arousal/Alertness: Awake/alert Behavior During Therapy: WFL for tasks assessed/performed Overall Cognitive Status: No family/caregiver present to determine baseline cognitive functioning                   General Comments: Pt confusing current location with SNF. perseverating on un-related topics throughout. Needs cues for attention, sequencing and problem solving               General Comments VSS on Ra    Pertinent Vitals/ Pain       Pain Assessment Pain Assessment: Faces Faces Pain Scale: Hurts little more Pain Location: Lower lumbar spine Pain Descriptors / Indicators: Discomfort Pain Intervention(s): Limited activity within patient's tolerance, Monitored during session   Frequency  Min 2X/week        Progress Toward Goals  OT Goals(current goals can now be found in the care plan section)  Progress towards OT goals: Progressing toward goals  Acute Rehab OT Goals Patient Stated Goal: to talk to niece OT Goal Formulation: With patient Time For Goal Achievement: 05/03/22 Potential to Achieve Goals: Fair ADL Goals Pt Will Perform Lower Body Bathing: with min assist;sit to/from stand;sitting/lateral leans Pt Will Perform Lower Body Dressing: with min assist;sitting/lateral leans;sit to/from stand Pt Will Transfer to Toilet: stand pivot transfer;with mod assist;bedside commode Additional ADL Goal #1: Patient will demonstrate increased cognition to follow 1-2 step commands consistently to engage in ADLs. Additional ADL Goal #2: Patient will demonstrate increased activity tolerance to complete functional ADL task sitting EOB for 5 minutes without external assist to remain sitting.  Plan Discharge plan remains appropriate       AM-PAC OT "6 Clicks" Daily Activity     Outcome Measure   Help from another person eating meals?: A Little Help from another person taking care of personal grooming?: A Little Help from another person toileting, which includes using toliet, bedpan, or urinal?: Total Help from another person bathing (including washing, rinsing, drying)?: A Lot Help from another person to put on and taking off regular upper body clothing?: A Lot Help from another person to put on and taking off regular lower body clothing?: Total 6 Click Score: 12    End of Session    OT Visit Diagnosis: Unsteadiness on feet (R26.81);Repeated falls  (R29.6);Other abnormalities of gait and mobility (R26.89);Muscle weakness (generalized) (M62.81);History of falling (Z91.81);Adult, failure to thrive (R62.7)   Activity Tolerance Patient tolerated treatment well   Patient Left in bed;with call bell/phone within reach;with bed alarm set   Nurse Communication Mobility status        Time: [id number] OT Time Calculation (min): 20 min  Charges: OT General Charges $OT Visit: 1 Visit OT Treatments $Therapeutic Activity: 8-22 mins    Edla Para A Elbony Mcclimans 04/24/2022, 5:26 PM

## 2022-04-24 NOTE — TOC Progression Note (Addendum)
Transition of Care [facility]) - Progression Note    Patient Details  Name: GLORI MACHNIK MRN: [id number] Date of Birth: [birth year]  Transition [facility]) CM/SW Contact  Carley Hammed, [place] Phone Number: 04/24/2022, 11:14 AM  Clinical Narrative:    CSW followed up with facility and was informed authorization is still pending for this pt. [facility] will update CSW when Berkley Harvey is received. TOC will continue to follow for DC needs.  12:00 Facility confirmed Berkley Harvey is still pending  12:42 CSW notified by [facility] that authorization has gone to peer to peer prior to being sent to [employer]. CSW requested MD call [phone]. [phone], to speak with NP Fransico Setters prior to 4PM. CSW will continue to follow for DC needs.  2:30  MD notified CSW that this was not a peer to peer, that only info was needed and someone will reach out to him if necessary. CSW updated facility who confirmed they were told it was a peer to peer. Will continue to follow for authorization decision. Expected Discharge Plan: Assisted Living Barriers to Discharge: Continued Medical Work up  Expected Discharge Plan and Services Expected Discharge Plan: Assisted Living In-house Referral: Clinical Social Work   Post Acute Care Choice:  (ALF) Living arrangements for the past 2 months: Assisted Living Facility                                       Social Determinants of Health (SDOH) Interventions    Readmission Risk Interventions     View : No data to display.

## 2022-04-25 DIAGNOSIS — N179 Acute kidney failure, unspecified: Secondary | ICD-10-CM | POA: Diagnosis not present

## 2022-04-25 DIAGNOSIS — E785 Hyperlipidemia, unspecified: Secondary | ICD-10-CM | POA: Diagnosis not present

## 2022-04-25 DIAGNOSIS — J0140 Acute pansinusitis, unspecified: Secondary | ICD-10-CM | POA: Diagnosis not present

## 2022-04-25 DIAGNOSIS — E872 Acidosis, unspecified: Secondary | ICD-10-CM | POA: Diagnosis not present

## 2022-04-25 DIAGNOSIS — I4581 Long QT syndrome: Secondary | ICD-10-CM | POA: Diagnosis not present

## 2022-04-25 DIAGNOSIS — E0590 Thyrotoxicosis, unspecified without thyrotoxic crisis or storm: Secondary | ICD-10-CM | POA: Diagnosis not present

## 2022-04-25 DIAGNOSIS — E119 Type 2 diabetes mellitus without complications: Secondary | ICD-10-CM | POA: Diagnosis not present

## 2022-04-25 DIAGNOSIS — N1831 Chronic kidney disease, stage 3a: Secondary | ICD-10-CM | POA: Diagnosis not present

## 2022-04-25 DIAGNOSIS — F331 Major depressive disorder, recurrent, moderate: Secondary | ICD-10-CM | POA: Diagnosis not present

## 2022-04-25 DIAGNOSIS — R531 Weakness: Secondary | ICD-10-CM | POA: Diagnosis not present

## 2022-04-25 DIAGNOSIS — I4891 Unspecified atrial fibrillation: Secondary | ICD-10-CM | POA: Diagnosis not present

## 2022-04-25 DIAGNOSIS — N170 Acute kidney failure with tubular necrosis: Secondary | ICD-10-CM | POA: Diagnosis not present

## 2022-04-25 DIAGNOSIS — F05 Delirium due to known physiological condition: Secondary | ICD-10-CM | POA: Diagnosis not present

## 2022-04-25 DIAGNOSIS — R339 Retention of urine, unspecified: Secondary | ICD-10-CM | POA: Diagnosis not present

## 2022-04-25 DIAGNOSIS — W19XXXA Unspecified fall, initial encounter: Secondary | ICD-10-CM | POA: Diagnosis not present

## 2022-04-25 DIAGNOSIS — E559 Vitamin D deficiency, unspecified: Secondary | ICD-10-CM | POA: Diagnosis not present

## 2022-04-25 DIAGNOSIS — E039 Hypothyroidism, unspecified: Secondary | ICD-10-CM | POA: Diagnosis not present

## 2022-04-25 DIAGNOSIS — G9341 Metabolic encephalopathy: Secondary | ICD-10-CM | POA: Diagnosis not present

## 2022-04-25 DIAGNOSIS — Z7401 Bed confinement status: Secondary | ICD-10-CM | POA: Diagnosis not present

## 2022-04-25 DIAGNOSIS — I480 Paroxysmal atrial fibrillation: Secondary | ICD-10-CM | POA: Diagnosis not present

## 2022-04-25 DIAGNOSIS — E118 Type 2 diabetes mellitus with unspecified complications: Secondary | ICD-10-CM | POA: Diagnosis not present

## 2022-04-25 DIAGNOSIS — N1832 Chronic kidney disease, stage 3b: Secondary | ICD-10-CM | POA: Diagnosis not present

## 2022-04-25 DIAGNOSIS — G629 Polyneuropathy, unspecified: Secondary | ICD-10-CM | POA: Diagnosis not present

## 2022-04-25 DIAGNOSIS — I5040 Unspecified combined systolic (congestive) and diastolic (congestive) heart failure: Secondary | ICD-10-CM | POA: Diagnosis not present

## 2022-04-25 DIAGNOSIS — N390 Urinary tract infection, site not specified: Secondary | ICD-10-CM | POA: Diagnosis not present

## 2022-04-25 DIAGNOSIS — F419 Anxiety disorder, unspecified: Secondary | ICD-10-CM | POA: Diagnosis not present

## 2022-04-25 DIAGNOSIS — K5900 Constipation, unspecified: Secondary | ICD-10-CM | POA: Diagnosis not present

## 2022-04-25 DIAGNOSIS — D649 Anemia, unspecified: Secondary | ICD-10-CM | POA: Diagnosis not present

## 2022-04-25 DIAGNOSIS — R278 Other lack of coordination: Secondary | ICD-10-CM | POA: Diagnosis not present

## 2022-04-25 DIAGNOSIS — I10 Essential (primary) hypertension: Secondary | ICD-10-CM | POA: Diagnosis not present

## 2022-04-25 LAB — GLUCOSE, CAPILLARY
Glucose-Capillary: 160 mg/dL — ABNORMAL HIGH (ref 70–99)
Glucose-Capillary: 183 mg/dL — ABNORMAL HIGH (ref 70–99)

## 2022-04-25 NOTE — Progress Notes (Signed)
Pt refused blood sugar check by NT this morning.

## 2022-04-25 NOTE — Progress Notes (Signed)
Patient refused morning oral meds. Pahwani,MD made aware

## 2022-04-25 NOTE — TOC Progression Note (Signed)
Transition of Care [facility]) - Progression Note    Patient Details  Name: [name] MRN: [id number] Date of Birth: 1923/05/11  [facility] CM/SW Contact  Carley Hammed, [place] Phone Number: 04/25/2022, 9:54 AM  Clinical Narrative:     CSW received call from pt's POA/ Niece requesting an update. CSW advised that at this time pt's authorization has gone to peer to peer. PT is on today, new notes will be sent to insurance company. Niece asked what would happen if it was denied. CSW advised of their right to appeal the decision, but we would also need to consider eturning to [place] with assistance. CSW will continue to follow to determine disposition options.  Expected Discharge Plan: Assisted Living Barriers to Discharge: Continued Medical Work up  Expected Discharge Plan and Services Expected Discharge Plan: Assisted Living In-house Referral: Clinical Social Work   Post Acute Care Choice:  (ALF) Living arrangements for the past 2 months: Assisted Living Facility                                       Social Determinants of Health (SDOH) Interventions    Readmission Risk Interventions     No data to display

## 2022-04-25 NOTE — Discharge Summary (Signed)
[facility] Discharge Summary  [name] ZOX:[id number] DOB: February 11, 1923 DOA: 04/16/2022  PCP: Mayra Neer, MD  Admit date: 04/16/2022 Discharge date: 04/25/2022 30 Day Unplanned Readmission Risk Score    Flowsheet Row ED to Hosp-Admission (Current) from 04/16/2022 in [facility]  SURGICAL  30 Day Unplanned Readmission Risk Score (%) 27.29 Filed at 04/25/2022 0801       This score is the patient's risk of an unplanned readmission within 30 days of being discharged (0 -100%). The score is based on dignosis, age, lab data, medications, orders, and past utilization.   Low:  0-14.9   Medium: 15-21.9   High: 22-29.9   Extreme: 30 and above          Admitted From: ALF [facility] Disposition: SNF  Recommendations for Outpatient Follow-up:  Follow up with PCP in 1-2 weeks Please obtain BMP/CBC in one week Please follow up with your PCP on the following pending results: Unresulted Labs (From admission, onward)    None         Home Health: None Equipment/Devices: None  Discharge Condition: Stable CODE STATUS: DNR Diet recommendation: Cardiac  Subjective: Seen and examined.  Patient fully alert and oriented.  No complaints.  She understands that she is going to be discharged to SNF today.  Brief/Interim Summary: Patient is a 88 year old female with history of DM, CKD stage III, HTN, chronic systolic CHF EF of 30 to 81% presented from [facility], ALF with a fall.  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  He had pushed her wheelchair out of the way to perform his duties.  He failed to put the wheelchair back next to her when he left.  Patient reported that she was trying to use her walker to get to the wheelchair and fell when trying to get into the wheelchair.  Reportedly she was given heavy laxatives which resulted in a lot of diarrhea.  Patient was admitted for further work-up for  following issues, details below.  Acute renal failure superimposed on stage 3a-3b chronic kidney disease ([place]) Likely due to significant diarrhea from laxatives. Presented with creatinine of 2.6, plateaued at 2.8 now 1.67, baseline creatinine 1.2-1.5.   UTI/asymptomatic bacteriuria: Patient was started on Rocephin for UTI however patient's UA appears to be contaminated, not impressive enough, urine culture negative as well.  I doubt it is a real UTI. Nonetheless, she had received 3 days of Rocephin with end date of 04/21/2022.   Acute metabolic encephalopathy/delirium: Patient had intermittent episodes of delirium.  Managed with Seroquel.  Has been doing well since last few days.  At times she was still confused but I believe she will do better outside the hospital.   Qtc prolongation Very mild QTc pronation which is a stable.   Type 2 diabetes mellitus with diabetic neuropathy, without long-term current use of insulin (HCC) -Hemoglobin A1c 6.3 -Continue sliding scale insulin.  Blood sugar controlled.    Essential hypertension: Fairly controlled.  Continue Coreg and losartan.  Resuming torsemide and Aldactone.   Paroxysmal atrial fibrillation?  This diagnosis is documented in her problem list however I reviewed several EKGs going back 3 years from now but not even a single EKG has atrial fibrillation.  She is not on any anticoagulation either.  Unsure if she really has history of atrial fibrillation.  Continue Coreg.     GERD -Continue PPI    Chronic combined systolic and diastolic  congestive heart failure ([place]) -2D echo May/2022 showed EF of 30 to 35% Resume home dose of diuretics.   Hypothyroid -Continue Synthroid   Disposition: Bed arranged.  Peer to peer completed today and she was approved for SNF by her insurance company.  Patient is being discharged in stable condition.  Discharge plan was discussed with patient and/or family member and they verbalized understanding and agreed  with it.  Discharge Diagnoses:  Principal Problem:   Acute renal failure superimposed on stage 3a chronic kidney disease (HCC) Active Problems:   Hyperkalemia   Type 2 diabetes mellitus with diabetic neuropathy, without long-term current use of insulin (HCC)   Essential hypertension   Atrial fibrillation (HCC)   GERD   Chronic combined systolic and diastolic congestive heart failure (HCC)   Stage 3a chronic kidney disease (CKD) (HCC) - baseline SCr 1.25-1.5   Hypothyroid   DNR (do not resuscitate)/DNI(Do No Intubate)   Acute kidney injury ([place])   Pressure injury of skin    Discharge Instructions   Allergies as of 04/25/2022       Reactions   Fosamax [alendronate Sodium] Swelling   Penicillins Swelling, Other (See Comments)   Swelling, redness and warmth at injection site Has patient had a PCN reaction causing immediate rash, facial/tongue/throat swelling, SOB or lightheadedness with hypotension: No Has patient had a PCN reaction causing severe rash involving mucus membranes or skin necrosis: No Has patient had a PCN reaction that required hospitalization: No Has patient had a PCN reaction occurring within the last 10 years: No If all of the above answers are "NO", then may proceed with Cephalosporin use.   Sulfonamide Derivatives Swelling   Tetanus Toxoid Swelling        Medication List     TAKE these medications    acetaminophen 500 MG tablet Commonly known as: TYLENOL Take 500 mg by mouth daily as needed for moderate pain.   albuterol 108 (90 Base) MCG/ACT inhaler Commonly known as: VENTOLIN HFA Inhale 2 puffs into the lungs every 4 (four) hours as needed for wheezing or shortness of breath.   ALPRAZolam 1 MG tablet Commonly known as: XANAX Take 1 tablet (1 mg total) by mouth 3 (three) times daily as needed for anxiety. What changed:  how much to take when to take this reasons to take this   ANTI [facility] EX Apply 1 application topically 3 (three) times daily  as needed (itching).   aspirin 81 MG chewable tablet Chew 81 mg by mouth daily.   benzonatate 100 MG capsule Commonly known as: TESSALON Take 100 mg by mouth 3 (three) times daily as needed for cough.   Biofreeze 4 % Gel Generic drug: Menthol (Topical Analgesic) Apply 1 application topically 3 (three) times daily as needed (bilateral hip pain).   bisacodyl 5 MG EC tablet Commonly known as: DULCOLAX Take 10 mg by mouth daily as needed for moderate constipation.   buPROPion 150 MG 24 hr tablet Commonly known as: WELLBUTRIN XL Take 150 mg by mouth daily.   carvedilol 6.25 MG tablet Commonly known as: COREG Take 6.25 mg by mouth 2 (two) times daily.   CHLORASEPTIC SORE THROAT MT Use as directed 2 sprays in the mouth or throat every 4 (four) hours as needed (sore throat).   colestipol 1 g tablet Commonly known as: COLESTID Take 2 tablets (2 g total) by mouth daily. Hold FOR CONSTIPATION   Desitin 13 % Crea Generic drug: Zinc Oxide Apply 1 application topically See admin instructions.  Cleanse groin area under breast with soap and water, pat dry and apply ointment on area as needed for redness.   docusate sodium 100 MG capsule Commonly known as: COLACE Take 100 mg by mouth 2 (two) times daily.   Eucerin Original Healing Lotn Apply 1 application. topically daily. Apply to arms,legs and back   ferrous sulfate 325 (65 FE) MG tablet Take 1 tablet (325 mg total) by mouth daily with breakfast.   GERI-LANTA PO Take 30 mLs by mouth in the morning and at bedtime.   GOLD BOND BABY POWDER EX Apply 1 application. topically 2 (two) times daily as needed (itching).   guaifenesin 100 MG/5ML syrup Commonly known as: ROBITUSSIN Take 100 mg by mouth every 8 (eight) hours as needed for cough.   guaiFENesin 600 MG 12 hr tablet Commonly known as: MUCINEX Take 600 mg by mouth 2 (two) times daily as needed for cough (congestion).   HYDROcodone-acetaminophen 5-325 MG tablet Commonly  known as: NORCO/VICODIN Take 1 tablet by mouth every 12 (twelve) hours as needed for moderate pain. What changed:  when to take this reasons to take this Another medication with the same name was removed. Continue taking this medication, and follow the directions you see here.   hydrOXYzine 10 MG tablet Commonly known as: ATARAX Take 10 mg by mouth 3 (three) times daily as needed for itching.   lactobacillus acidophilus & bulgar chewable tablet Chew 1 tablet by mouth daily.   levothyroxine 25 MCG tablet Commonly known as: SYNTHROID Take 25 mcg by mouth daily before breakfast.   loperamide 2 MG tablet Commonly known as: IMODIUM A-D Take 2-4 mg by mouth See admin instructions. 4 mg after first loose stool, then 2 mg after each loose stool   losartan 50 MG tablet Commonly known as: COZAAR Take 0.5 tablets (25 mg total) by mouth daily.   magnesium hydroxide 400 MG/5ML suspension Commonly known as: MILK OF MAGNESIA Take 15 mLs by mouth daily as needed for mild constipation.   mirabegron ER 25 MG Tb24 tablet Commonly known as: MYRBETRIQ Take 25 mg by mouth daily.   mirtazapine 30 MG tablet Commonly known as: REMERON Take 1 tablet (30 mg total) by mouth at bedtime.   mupirocin ointment 2 % Commonly known as: BACTROBAN Apply 1 application. topically daily.   NON FORMULARY Take 2.5 mLs by mouth 3 (three) times daily as needed (sore throat). Salt and water solution   ondansetron 4 MG tablet Commonly known as: ZOFRAN Take 4 mg by mouth daily as needed for nausea or vomiting.   pantoprazole 40 MG tablet Commonly known as: PROTONIX Take 1 tablet (40 mg total) by mouth 2 (two) times daily.   polyethylene glycol powder 17 GM/SCOOP powder Commonly known as: GLYCOLAX/MIRALAX Take 17 g by mouth daily.   PRESERVISION AREDS PO Take 1 capsule by mouth daily.   sertraline 100 MG tablet Commonly known as: ZOLOFT Take 1 tablet (100 mg total) by mouth daily. What changed: how  much to take   simvastatin 40 MG tablet Commonly known as: ZOCOR Take 40 mg by mouth at bedtime.   Sinus Rinse Kit Pack Place 1 each into the nose every hour as needed (congestion).   spironolactone 25 MG tablet Commonly known as: ALDACTONE Take 0.5 tablets (12.5 mg total) by mouth daily.   torsemide 20 MG tablet Commonly known as: DEMADEX Take 20 mg by mouth daily as needed (leg swelling).   torsemide 10 MG tablet Commonly known as: DEMADEX Take  1 tablet (10 mg total) by mouth daily.   triamcinolone cream 0.1 % Commonly known as: KENALOG 1 application 2 (two) times daily as needed (itching). perineum        Follow-up Information     Mayra Neer, MD Follow up in 1 week(s).   Specialty: Family Medicine Contact information: [address] [address] [phone]         Josue Hector, MD .   Specialty: Cardiology Contact information: [phone] [place] [address] [phone]                Allergies  Allergen Reactions   Fosamax [Alendronate Sodium] Swelling   Penicillins Swelling and Other (See Comments)    Swelling, redness and warmth at injection site Has patient had a PCN reaction causing immediate rash, facial/tongue/throat swelling, SOB or lightheadedness with hypotension: No Has patient had a PCN reaction causing severe rash involving mucus membranes or skin necrosis: No Has patient had a PCN reaction that required hospitalization: No Has patient had a PCN reaction occurring within the last 10 years: No If all of the above answers are "NO", then may proceed with Cephalosporin use.   Sulfonamide Derivatives Swelling   Tetanus Toxoid Swelling    Consultations: None   Procedures/Studies: CT HEAD WO CONTRAST  Result Date: 04/17/2022 CLINICAL DATA:  Fall. EXAM: CT HEAD WITHOUT CONTRAST CT CERVICAL SPINE WITHOUT CONTRAST TECHNIQUE: Multidetector CT imaging of the head and cervical spine  was performed following the standard protocol without intravenous contrast. Multiplanar CT image reconstructions of the cervical spine were also generated. RADIATION DOSE REDUCTION: This exam was performed according to the departmental dose-optimization program which includes automated exposure control, adjustment of the mA and/or kV according to patient size and/or use of iterative reconstruction technique. COMPARISON:  CT head 03/22/2022 FINDINGS: CT HEAD FINDINGS Brain: No evidence of acute infarction, hemorrhage, hydrocephalus, extra-axial collection or mass lesion/mass effect. Again seen is mild diffuse atrophy and mild periventricular white matter hypodensity, likely chronic small vessel ischemic change. Vascular: Atherosclerotic calcifications are present within the cavernous internal carotid arteries. Skull: Normal. Negative for fracture or focal lesion. Sinuses/Orbits: No acute finding. Other: None. CT CERVICAL SPINE FINDINGS Alignment: There is trace anterolisthesis at C3-C4 and 2 mm of anterolisthesis at C5-C6. Findings are favored as degenerative. Alignment is otherwise anatomic. Skull base and vertebrae: The bones are diffusely osteopenic. There is no acute fracture or focal osseous lesion identified. Soft tissues and spinal canal: No prevertebral fluid or swelling. No visible canal hematoma. Disc levels: There is intervertebral disc space narrowing and endplate osteophyte formation at C5-C6 and C6-C7 compatible with degenerative change. Diffuse facet arthropathy is seen bilaterally, right greater than left causing severe neural foraminal stenosis on the right at C3-C4. There is no severe central canal stenosis at any level. Upper chest: Negative. Other: None. IMPRESSION: 1.  No acute intracranial process. 2. No acute fracture or traumatic subluxation of the cervical spine. 3. Mild diffuse brain atrophy. Mild chronic small vessel ischemic change. 4. Multilevel degenerative changes of the cervical spine  with grade 1 anterolisthesis at C3-C4 and C5-C6. Electronically Signed   By: Ronney Asters M.D.   On: 04/17/2022 00:37   CT CERVICAL SPINE WO CONTRAST  Result Date: 04/17/2022 CLINICAL DATA:  Fall. EXAM: CT HEAD WITHOUT CONTRAST CT CERVICAL SPINE WITHOUT CONTRAST TECHNIQUE: Multidetector CT imaging of the head and cervical spine was performed following the standard protocol without intravenous contrast. Multiplanar CT  image reconstructions of the cervical spine were also generated. RADIATION DOSE REDUCTION: This exam was performed according to the departmental dose-optimization program which includes automated exposure control, adjustment of the mA and/or kV according to patient size and/or use of iterative reconstruction technique. COMPARISON:  CT head 03/22/2022 FINDINGS: CT HEAD FINDINGS Brain: No evidence of acute infarction, hemorrhage, hydrocephalus, extra-axial collection or mass lesion/mass effect. Again seen is mild diffuse atrophy and mild periventricular white matter hypodensity, likely chronic small vessel ischemic change. Vascular: Atherosclerotic calcifications are present within the cavernous internal carotid arteries. Skull: Normal. Negative for fracture or focal lesion. Sinuses/Orbits: No acute finding. Other: None. CT CERVICAL SPINE FINDINGS Alignment: There is trace anterolisthesis at C3-C4 and 2 mm of anterolisthesis at C5-C6. Findings are favored as degenerative. Alignment is otherwise anatomic. Skull base and vertebrae: The bones are diffusely osteopenic. There is no acute fracture or focal osseous lesion identified. Soft tissues and spinal canal: No prevertebral fluid or swelling. No visible canal hematoma. Disc levels: There is intervertebral disc space narrowing and endplate osteophyte formation at C5-C6 and C6-C7 compatible with degenerative change. Diffuse facet arthropathy is seen bilaterally, right greater than left causing severe neural foraminal stenosis on the right at C3-C4.  There is no severe central canal stenosis at any level. Upper chest: Negative. Other: None. IMPRESSION: 1.  No acute intracranial process. 2. No acute fracture or traumatic subluxation of the cervical spine. 3. Mild diffuse brain atrophy. Mild chronic small vessel ischemic change. 4. Multilevel degenerative changes of the cervical spine with grade 1 anterolisthesis at C3-C4 and C5-C6. Electronically Signed   By: Ronney Asters M.D.   On: 04/17/2022 00:37   CT ABDOMEN PELVIS WO CONTRAST  Result Date: 04/17/2022 CLINICAL DATA:  Acute abdominal pain, abdominal trauma. EXAM: CT ABDOMEN AND PELVIS WITHOUT CONTRAST TECHNIQUE: Multidetector CT imaging of the abdomen and pelvis was performed following the standard protocol without IV contrast. RADIATION DOSE REDUCTION: This exam was performed according to the departmental dose-optimization program which includes automated exposure control, adjustment of the mA and/or kV according to patient size and/or use of iterative reconstruction technique. COMPARISON:  CT abdomen and pelvis 04/06/2021 FINDINGS: Lower chest: No acute abnormality. Hepatobiliary: No focal liver abnormality is seen. Status post cholecystectomy. No biliary dilatation. Pancreas: Small low-attenuation/cystic lesions in the pancreas measuring 14 mm in the pancreatic head and 12 mm in the pancreatic tail appear unchanged. No acute inflammation. No ductal dilatation. Spleen: Normal in size without focal abnormality. Adrenals/Urinary Tract: No hydronephrosis or urinary tract calculus. Right renal cysts measuring up to 5.2 cm appear unchanged. Adrenal glands and bladder are within normal limits. Stomach/Bowel: Stomach is within normal limits. No evidence of bowel wall thickening, distention, or inflammatory changes. There is sigmoid colon diverticulosis without evidence for diverticulitis. The appendix is not seen. Vascular/Lymphatic: Aortic atherosclerosis. No enlarged abdominal or pelvic lymph nodes.  Reproductive: This area is not well evaluated secondary to streak artifact in the pelvis. Other: No abdominal wall hernia or abnormality. No abdominopelvic ascites. Musculoskeletal: The bones are osteopenic. Degenerative changes affect the spine. Bilateral hip arthroplasties are present. IMPRESSION: 1. No acute localizing process in the abdomen or pelvis. 2. Stable cystic lesions in the pancreas. 3. Sigmoid colon diverticulosis. 4. Stable renal cysts measuring up to 5.2 cm. 5.  Aortic Atherosclerosis (ICD10-I70.0). Electronically Signed   By: Ronney Asters M.D.   On: 04/17/2022 00:30   DG Wrist Complete Right  Result Date: 04/16/2022 CLINICAL DATA:  Fall EXAM: RIGHT WRIST - COMPLETE 3+  VIEW COMPARISON:  None Available. FINDINGS: No fracture or dislocation is seen. Mild degenerative changes of the 1st carpometacarpal joint and along the radial aspect of the wrist. Negative ulnar variance.  Old ulnar styloid fracture. Visualized soft tissues are within normal limits. IMPRESSION: Negative. Electronically Signed   By: Julian Hy M.D.   On: 04/16/2022 22:43   DG Tibia/Fibula Right Port  Result Date: 04/16/2022 CLINICAL DATA:  Fall EXAM: PORTABLE RIGHT TIBIA AND FIBULA - 2 VIEW COMPARISON:  None Available. FINDINGS: No fracture or dislocation is seen. The visualized soft tissues are unremarkable. IMPRESSION: Negative. Electronically Signed   By: Julian Hy M.D.   On: 04/16/2022 22:42   DG Pelvis Portable  Result Date: 04/16/2022 CLINICAL DATA:  Fall EXAM: PORTABLE PELVIS 1-2 VIEWS COMPARISON:  None Available. FINDINGS: Bilateral hip arthroplasties. No fracture or dislocation is seen. Visualized bony pelvis appears intact. IMPRESSION: Negative. Electronically Signed   By: Julian Hy M.D.   On: 04/16/2022 22:42   DG Knee Right Port  Result Date: 04/16/2022 CLINICAL DATA:  Fall EXAM: PORTABLE RIGHT KNEE - 1-2 VIEW COMPARISON:  None Available. FINDINGS: No fracture or dislocation is seen.  Mild tricompartmental degenerative changes. Mild soft tissue swelling along the anterior aspect of the proximal tibia. No suprapatellar knee joint effusion. IMPRESSION: No fracture or dislocation is seen. Mild soft tissue swelling along the anterior aspect of the proximal tibia. Electronically Signed   By: Julian Hy M.D.   On: 04/16/2022 22:41   DG Knee Left Port  Result Date: 04/16/2022 CLINICAL DATA:  Fall EXAM: PORTABLE LEFT KNEE - 1-2 VIEW COMPARISON:  None Available. FINDINGS: No fracture or dislocation is seen. Mild degenerative changes of the patellofemoral joint. Visualized soft tissues are within normal limits. No suprapatellar knee joint effusion. IMPRESSION: Negative. Electronically Signed   By: Julian Hy M.D.   On: 04/16/2022 22:40   DG Elbow Complete Right  Result Date: 04/16/2022 CLINICAL DATA:  Fall EXAM: RIGHT ELBOW - COMPLETE 3+ VIEW COMPARISON:  None Available. FINDINGS: No fracture or dislocation is seen. The joint spaces are preserved. The visualized soft tissues are unremarkable. No displaced elbow joint fat pads to suggest an elbow joint effusion. IMPRESSION: Negative. Electronically Signed   By: Julian Hy M.D.   On: 04/16/2022 22:39   DG Chest Port 1 View  Result Date: 04/16/2022 CLINICAL DATA:  Fall. EXAM: PORTABLE CHEST 1 VIEW COMPARISON:  Chest x-ray 04/15/2022 FINDINGS: The heart size and mediastinal contours are within normal limits. Both lungs are clear. The visualized skeletal structures are unremarkable. IMPRESSION: No active disease. Electronically Signed   By: Ronney Asters M.D.   On: 04/16/2022 22:39   DG Ankle Right Port  Result Date: 04/16/2022 CLINICAL DATA:  Fall EXAM: PORTABLE RIGHT ANKLE - 2 VIEW COMPARISON:  None Available. FINDINGS: No fracture or dislocation is seen. The ankle mortise is intact. The base of the fifth metatarsal is unremarkable. Visualized soft tissues are within normal limits. IMPRESSION: Negative. Electronically  Signed   By: Julian Hy M.D.   On: 04/16/2022 22:39   DG Chest Port 1 View  Result Date: 04/15/2022 CLINICAL DATA:  SHOB EXAM: PORTABLE CHEST 1 VIEW COMPARISON:  May 6, 23 FINDINGS: No consolidation. No visible pleural effusions or pneumothorax. Cardiomediastinal silhouette is within normal limits. No displaced fracture. IMPRESSION: No evidence of acute cardiopulmonary disease. Electronically Signed   By: Margaretha Sheffield M.D.   On: 04/15/2022 11:08     Discharge Exam: Vitals:   04/24/22  1617 04/25/22 0907  BP: (!) 143/78 (!) 150/81  Pulse: 93 (!) 54  Resp: 19 19  Temp:  99.2 F (37.3 C)  SpO2: 97% 96%   Vitals:   04/24/22 0905 04/24/22 1617 04/25/22 0500 04/25/22 0907  BP: (!) 156/77 (!) 143/78  (!) 150/81  Pulse: 87 93  (!) 54  Resp: _0 Temp:  99 F (37.2 C)  99.2 F (37.3 C)  TempSrc:  Oral  Oral  SpO2: 97% 97%  96%  Weight:   91.9 kg 91.3 kg  Height:        General: Pt is alert, awake, not in acute distress Cardiovascular: RRR, S1/S2 +, no rubs, no gallops Respiratory: CTA bilaterally, no wheezing, no rhonchi Abdominal: Soft, NT, ND, bowel sounds + Extremities: no edema, no cyanosis    The results of significant diagnostics from this hospitalization (including imaging, microbiology, ancillary and laboratory) are listed below for reference.     Microbiology: Recent Results (from the past 240 hour(s))  Resp Panel by RT-PCR (Flu A&B, Covid) Anterior Nasal Swab     Status: None   Collection Time: 04/16/22 10:37 PM   Specimen: Anterior Nasal Swab  Result Value Ref Range Status   SARS Coronavirus 2 by RT PCR NEGATIVE NEGATIVE Final    Comment: (NOTE) SARS-CoV-2 target nucleic acids are NOT DETECTED.  The SARS-CoV-2 RNA is generally detectable in upper respiratory specimens during the acute phase of infection. The lowest concentration of SARS-CoV-2 viral copies this assay can detect is 138 copies/mL. A negative result does not preclude  SARS-Cov-2 infection and should not be used as the sole basis for treatment or other patient management decisions. A negative result may occur with  improper specimen collection/handling, submission of specimen other than nasopharyngeal swab, presence of viral mutation(s) within the areas targeted by this assay, and inadequate number of viral copies(<138 copies/mL). A negative result must be combined with clinical observations, patient history, and epidemiological information. The expected result is Negative.  Fact Sheet for Patients:  EntrepreneurPulse.com.au  Fact Sheet for Healthcare Providers:  IncredibleEmployment.be  This test is no t yet approved or cleared by the [place] FDA and  has been authorized for detection and/or diagnosis of SARS-CoV-2 by FDA under an Emergency Use Authorization (EUA). This EUA will remain  in effect (meaning this test can be used) for the duration of the COVID-19 declaration under Section 564(b)(1) of the Act, 21 U.S.C.section 360bbb-3(b)(1), unless the authorization is terminated  or revoked sooner.       Influenza A by PCR NEGATIVE NEGATIVE Final   Influenza B by PCR NEGATIVE NEGATIVE Final    Comment: (NOTE) The Xpert Xpress SARS-CoV-2/FLU/RSV plus assay is intended as an aid in the diagnosis of influenza from Nasopharyngeal swab specimens and should not be used as a sole basis for treatment. Nasal washings and aspirates are unacceptable for Xpert Xpress SARS-CoV-2/FLU/RSV testing.  Fact Sheet for Patients: EntrepreneurPulse.com.au  Fact Sheet for Healthcare Providers: IncredibleEmployment.be  This test is not yet approved or cleared by the [place] FDA and has been authorized for detection and/or diagnosis of SARS-CoV-2 by FDA under an Emergency Use Authorization (EUA). This EUA will remain in effect (meaning this test can be used) for the duration of  the COVID-19 declaration under Section 564(b)(1) of the Act, 21 U.S.C. section 360bbb-3(b)(1), unless the authorization is terminated or revoked.  Performed at [facility], [place] [address], [place], [place] [address]   Urine Culture  Status: None   Collection Time: 04/19/22 10:54 AM   Specimen: Urine, Clean Catch  Result Value Ref Range Status   Specimen Description URINE, CLEAN CATCH  Final   Special Requests NONE  Final   Culture   Final    NO GROWTH Performed at [facility], [address], [place], [place] [address]    Report Status 04/20/2022 FINAL  Final     Labs: BNP (last 3 results) Recent Labs    04/15/22 1034  BNP 615.3*   Basic Metabolic Panel: Recent Labs  Lab 04/19/22 1018 04/20/22 0136 04/21/22 0059 04/22/22 0316 04/23/22 0048 04/24/22 0138  NA  --  144 139 141 141 140  K  --  4.1 3.9 4.3 3.5 3.5  CL  --  112* 109 106 106 100  CO2  --  _0 GLUCOSE  --  119* 135* 137* 136* 109*  BUN  --  27* 24* 18 19 28*  CREATININE  --  1.26* 1.07* 1.21* 1.44* 1.64*  CALCIUM  --  8.6* 8.7* 9.1 9.2 9.1  MG 2.6*  --   --   --   --   --    Liver Function Tests: Recent Labs  Lab 04/19/22 1018  AST 43*  ALT 28  ALKPHOS 54  BILITOT 0.9  PROT 6.6  ALBUMIN 3.3*   No results for input(s): "LIPASE", "AMYLASE" in the last 168 hours. Recent Labs  Lab 04/19/22 1018  AMMONIA 39*   CBC: Recent Labs  Lab 04/19/22 0417 04/21/22 0059  WBC 8.4 10.9*  HGB 12.0 11.6*  HCT 37.1 34.9*  MCV 92.3 92.1  PLT 136* 141*   Cardiac Enzymes: No results for input(s): "CKTOTAL", "CKMB", "CKMBINDEX", "TROPONINI" in the last 168 hours. BNP: Invalid input(s): "POCBNP" CBG: Recent Labs  Lab 04/23/22 2240 04/24/22 0906 04/24/22 1205 04/24/22 1616 04/25/22 0907  GLUCAP 163* 136* 148* 149* 160*   D-Dimer No results for input(s): "DDIMER" in the last 72 hours. Hgb A1c No results for input(s): "HGBA1C" in the last 72 hours. Lipid Profile No  results for input(s): "CHOL", "HDL", "LDLCALC", "TRIG", "CHOLHDL", "LDLDIRECT" in the last 72 hours. Thyroid function studies No results for input(s): "TSH", "T4TOTAL", "T3FREE", "THYROIDAB" in the last 72 hours.  Invalid input(s): "FREET3" Anemia work up No results for input(s): "VITAMINB12", "FOLATE", "FERRITIN", "TIBC", "IRON", "RETICCTPCT" in the last 72 hours. Urinalysis    Component Value Date/Time   COLORURINE YELLOW 04/19/2022 1120   APPEARANCEUR CLEAR 04/19/2022 1120   LABSPEC 1.008 04/19/2022 1120   PHURINE 6.0 04/19/2022 1120   GLUCOSEU NEGATIVE 04/19/2022 1120   HGBUR NEGATIVE 04/19/2022 1120   BILIRUBINUR NEGATIVE 04/19/2022 1120   KETONESUR NEGATIVE 04/19/2022 1120   PROTEINUR NEGATIVE 04/19/2022 1120   UROBILINOGEN 0.2 06/21/2014 1924   NITRITE NEGATIVE 04/19/2022 1120   LEUKOCYTESUR TRACE (A) 04/19/2022 1120   Sepsis Labs Recent Labs  Lab 04/19/22 0417 04/21/22 0059  WBC 8.4 10.9*   Microbiology Recent Results (from the past 240 hour(s))  Resp Panel by RT-PCR (Flu A&B, Covid) Anterior Nasal Swab     Status: None   Collection Time: 04/16/22 10:37 PM   Specimen: Anterior Nasal Swab  Result Value Ref Range Status   SARS Coronavirus 2 by RT PCR NEGATIVE NEGATIVE Final    Comment: (NOTE) SARS-CoV-2 target nucleic acids are NOT DETECTED.  The SARS-CoV-2 RNA is generally detectable in upper respiratory specimens during the acute phase of infection. The lowest concentration of  SARS-CoV-2 viral copies this assay can detect is 138 copies/mL. A negative result does not preclude SARS-Cov-2 infection and should not be used as the sole basis for treatment or other patient management decisions. A negative result may occur with  improper specimen collection/handling, submission of specimen other than nasopharyngeal swab, presence of viral mutation(s) within the areas targeted by this assay, and inadequate number of viral copies(<138 copies/mL). A negative result must  be combined with clinical observations, patient history, and epidemiological information. The expected result is Negative.  Fact Sheet for Patients:  EntrepreneurPulse.com.au  Fact Sheet for Healthcare Providers:  IncredibleEmployment.be  This test is no t yet approved or cleared by the [place] FDA and  has been authorized for detection and/or diagnosis of SARS-CoV-2 by FDA under an Emergency Use Authorization (EUA). This EUA will remain  in effect (meaning this test can be used) for the duration of the COVID-19 declaration under Section 564(b)(1) of the Act, 21 U.S.C.section 360bbb-3(b)(1), unless the authorization is terminated  or revoked sooner.       Influenza A by PCR NEGATIVE NEGATIVE Final   Influenza B by PCR NEGATIVE NEGATIVE Final    Comment: (NOTE) The Xpert Xpress SARS-CoV-2/FLU/RSV plus assay is intended as an aid in the diagnosis of influenza from Nasopharyngeal swab specimens and should not be used as a sole basis for treatment. Nasal washings and aspirates are unacceptable for Xpert Xpress SARS-CoV-2/FLU/RSV testing.  Fact Sheet for Patients: EntrepreneurPulse.com.au  Fact Sheet for Healthcare Providers: IncredibleEmployment.be  This test is not yet approved or cleared by the [place] FDA and has been authorized for detection and/or diagnosis of SARS-CoV-2 by FDA under an Emergency Use Authorization (EUA). This EUA will remain in effect (meaning this test can be used) for the duration of the COVID-19 declaration under Section 564(b)(1) of the Act, 21 U.S.C. section 360bbb-3(b)(1), unless the authorization is terminated or revoked.  Performed at [facility], [place] [address] [place], [place] [address]   Urine Culture     Status: None   Collection Time: 04/19/22 10:54 AM   Specimen: Urine, Clean Catch  Result Value Ref Range Status   Specimen Description URINE, CLEAN  CATCH  Final   Special Requests NONE  Final   Culture   Final    NO GROWTH Performed at [facility], [place] [address], [place],  [address]    Report Status 04/20/2022 FINAL  Final     Time coordinating discharge: Over 30 minutes  SIGNED:   Darliss Cheney, MD  [facility] 04/25/2022, 10:54 AM *Please note that this is a verbal dictation therefore any spelling or grammatical errors are due to the "[employer]" system interpretation. If 7PM-7AM, please contact night-coverage [email or web address]

## 2022-04-25 NOTE — Progress Notes (Signed)
Pt still refusing all her meds including heparin sq and vital signs checks this morning. Also strongly refuses hygiene care despite encouragement. Pt alert to self, place and time but disoriented to situation, noted to have poor judgement and episodes of paranoia. Pt verbalizes "I don't want anything done for me or else I will have FBI come for you, I will call my friends from the facility, they are very loyal to me and they will come after you, I will only let my family doctor take care of me since he already knows me." MD informed of pts refusal to red med list. Will continue to monitor pt.

## 2022-04-25 NOTE — Progress Notes (Signed)
Physical Therapy Treatment Patient Details Name: [name] MRN: [id number] DOB: 1923/11/12 Today's Date: 04/25/2022   History of Present Illness Patient is 88 y.o. female from [facility] after a fall. Pt reports she was trying to use her walker to get to the wheelchair and fell when trying to get into the wheelchair. Imaging for LE, head, neck, and UE all negative for acute injury. PMH significant for Afib, DM, GERD, HLD, HTN, LBBB, Lt THA.    PT Comments    Pt very confused with tangential speech this date. Upon arrival, pt was anxious in regards to difficulty breathing and she was afraid she was "having another mini stroke". Once vitals were checked with SpO2 reading 97-99% on RA and BP 139/107 supine, pt became calm and reported feeling better with no further concerns by end of session. RN aware. Focused session on strengthening exercises in bed and trying to sit EOB, but pt fatiguing quickly and only tolerating sitting EOB ~2 min before requesting to return to supine. Pt with posterior lean, needing mod-maxA to sit EOB. Will continue to follow acutely. Current recommendations remain appropriate.    Recommendations for follow up therapy are one component of a multi-disciplinary discharge planning process, led by the attending physician.  Recommendations may be updated based on patient status, additional functional criteria and insurance authorization.  Follow Up Recommendations  Skilled nursing-short term rehab (<3 hours/day)     Assistance Recommended at Discharge Frequent or constant Supervision/Assistance  Patient can return home with the following Two people to help with walking and/or transfers;Two people to help with bathing/dressing/bathroom;Direct supervision/assist for medications management;Assist for transportation;Help with stairs or ramp for entrance;Assistance with cooking/housework;Assistance with feeding;Direct supervision/assist for financial management    Equipment Recommendations  None recommended by PT    Recommendations for Other Services       Precautions / Restrictions Precautions Precautions: Fall Restrictions Weight Bearing Restrictions: No     Mobility  Bed Mobility Overal bed mobility: Needs Assistance Bed Mobility: Supine to Sit, Sit to Supine     Supine to sit: Total assist, HOB elevated Sit to supine: Total assist   General bed mobility comments: Cues to bring legs to EOB, pt attempting with assistance but then lost attention and returned legs to midline. Pt needing TA for all bed mobility aspects due to weakness and difficulty following cues.    Transfers                   General transfer comment: deferred this date    Ambulation/Gait               General Gait Details: deferred   Stairs             Wheelchair Mobility    Modified Rankin (Stroke Patients Only)       Balance Overall balance assessment: History of Falls, Needs assistance Sitting-balance support: Feet supported, Bilateral upper extremity supported Sitting balance-Leahy Scale: Poor Sitting balance - Comments: Pt with posterior lean, needing mod-maxA to sit ~2 min EOB Postural control: Posterior lean     Standing balance comment: deferred                            Cognition Arousal/Alertness: Awake/alert Behavior During Therapy: WFL for tasks assessed/performed Overall Cognitive Status: No family/caregiver present to determine baseline cognitive functioning  General Comments: Per chart, pt has been confused at times during hospitalization. Pt currently with tangential speech and difficulty maintaining attention to follow cues. Poor initiation by pt. Pt initially anxious in regards to difficulty breathing, some jerking movement of arms, and fear of "having another mini stroke". Once vitals were taken, pt reported she felt better. RN aware.         Exercises General Exercises - Upper Extremity Shoulder Flexion: AROM, Strengthening, Both, 10 reps, Supine Elbow Flexion: AROM, Strengthening, Both, 10 reps, Supine Elbow Extension: AROM, Strengthening, Both, 10 reps, Supine General Exercises - Lower Extremity Short Arc Quad: AROM, Strengthening, Both, 10 reps, Supine Hip ABduction/ADduction: AAROM, Strengthening, Both, 10 reps, Supine    General Comments General comments (skin integrity, edema, etc.): pt complaining of difficulty breathing upon arrival, SpO2 97-99% on RA and BP 139/107 supine with pt then calming down and reporting feeling better and completely fine again      Pertinent Vitals/Pain Pain Assessment Pain Assessment: Faces Faces Pain Scale: Hurts little more Pain Location: generalized Pain Descriptors / Indicators: Discomfort Pain Intervention(s): Limited activity within patient's tolerance, Monitored during session, Repositioned    Home Living                          Prior Function            PT Goals (current goals can now be found in the care plan section) Acute Rehab PT Goals Patient Stated Goal: to feel better PT Goal Formulation: With patient Time For Goal Achievement: 05/02/22 Potential to Achieve Goals: Fair Progress towards PT goals: Not progressing toward goals - comment (limited by fatigue today)    Frequency    Min 2X/week      PT Plan Current plan remains appropriate    Co-evaluation              AM-PAC PT "6 Clicks" Mobility   Outcome Measure  Help needed turning from your back to your side while in a flat bed without using bedrails?: Total Help needed moving from lying on your back to sitting on the side of a flat bed without using bedrails?: Total Help needed moving to and from a bed to a chair (including a wheelchair)?: Total Help needed standing up from a chair using your arms (e.g., wheelchair or bedside chair)?: Total Help needed to walk in hospital room?:  Total Help needed climbing 3-5 steps with a railing? : Total 6 Click Score: 6    End of Session   Activity Tolerance: Patient limited by fatigue Patient left: in bed;with bed alarm set;with call bell/phone within reach Nurse Communication: Mobility status (pt's concerns start of session with them resolving by end of session) PT Visit Diagnosis: Unsteadiness on feet (R26.81);Other abnormalities of gait and mobility (R26.89);Muscle weakness (generalized) (M62.81);Difficulty in walking, not elsewhere classified (R26.2);History of falling (Z91.81)     Time: 6063-0160 PT Time Calculation (min) (ACUTE ONLY): 23 min  Charges:  $Therapeutic Exercise: 8-22 mins $Therapeutic Activity: 8-22 mins                     Raymond Gurney, PT, DPT Acute Rehabilitation Services  Office: [phone]    Jewel Baize 04/25/2022, 5:52 PM

## 2022-04-25 NOTE — Progress Notes (Signed)
Patient discharged to [facility] via [facility] with all belongings

## 2022-04-25 NOTE — Progress Notes (Signed)
Patient refused glucose check and insulin coverage

## 2022-04-25 NOTE — TOC Transition Note (Signed)
Transition of Care [facility]) - CM/SW Discharge Note   Patient Details  Name: [name] MRN: [id number] Date of Birth: [birth year]  Transition of Care [facility]) CM/SW Contact:  Carley Hammed, LCSWA Phone Number: 04/25/2022, 12:15 PM   Clinical Narrative:    Pt to be transported to [facility] via [facility] at 2.  Nurse to call report to [phone].   Final next level of care: Skilled Nursing Facility Barriers to Discharge: Barriers Resolved   Patient Goals and CMS Choice Patient states their goals for this hospitalization and ongoing recovery are:: Rehab either at ALF or SNF CMS Medicare.gov Compare Post Acute Care list provided to:: Patient Choice offered to / list presented to : Patient  Discharge Placement              Patient chooses bed at: [facility] Patient to be transferred to facility by: [facility] Name of family member notified: Stefano Gaul Patient and family notified of of transfer: 04/25/22  Discharge Plan and Services In-house Referral: Clinical Social Work   Post Acute Care Choice:  (ALF)                               Social Determinants of Health (SDOH) Interventions     Readmission Risk Interventions     No data to display

## 2022-04-25 NOTE — Progress Notes (Signed)
Called report to [facility] [phone]. Spoke with nurse Nicholos Johns Who will taking care of patient there when she arrives . Nurse verbalized understanding of report and had no further questions. IV removed. [facility] has been called. Patient currently waiting in room for pick up.

## 2022-04-26 DIAGNOSIS — E0590 Thyrotoxicosis, unspecified without thyrotoxic crisis or storm: Secondary | ICD-10-CM | POA: Diagnosis not present

## 2022-04-26 DIAGNOSIS — E119 Type 2 diabetes mellitus without complications: Secondary | ICD-10-CM | POA: Diagnosis not present

## 2022-04-26 DIAGNOSIS — E559 Vitamin D deficiency, unspecified: Secondary | ICD-10-CM | POA: Diagnosis not present

## 2022-04-29 DIAGNOSIS — I5040 Unspecified combined systolic (congestive) and diastolic (congestive) heart failure: Secondary | ICD-10-CM | POA: Diagnosis not present

## 2022-04-29 DIAGNOSIS — G9341 Metabolic encephalopathy: Secondary | ICD-10-CM | POA: Diagnosis not present

## 2022-04-29 DIAGNOSIS — G629 Polyneuropathy, unspecified: Secondary | ICD-10-CM | POA: Diagnosis not present

## 2022-04-29 DIAGNOSIS — E039 Hypothyroidism, unspecified: Secondary | ICD-10-CM | POA: Diagnosis not present

## 2022-04-29 DIAGNOSIS — E119 Type 2 diabetes mellitus without complications: Secondary | ICD-10-CM | POA: Diagnosis not present

## 2022-04-29 DIAGNOSIS — I4891 Unspecified atrial fibrillation: Secondary | ICD-10-CM | POA: Diagnosis not present

## 2022-04-29 DIAGNOSIS — I10 Essential (primary) hypertension: Secondary | ICD-10-CM | POA: Diagnosis not present

## 2022-04-29 DIAGNOSIS — E785 Hyperlipidemia, unspecified: Secondary | ICD-10-CM | POA: Diagnosis not present

## 2022-04-29 DIAGNOSIS — D649 Anemia, unspecified: Secondary | ICD-10-CM | POA: Diagnosis not present

## 2022-05-01 DIAGNOSIS — N390 Urinary tract infection, site not specified: Secondary | ICD-10-CM | POA: Diagnosis not present

## 2022-05-01 DIAGNOSIS — E559 Vitamin D deficiency, unspecified: Secondary | ICD-10-CM | POA: Diagnosis not present

## 2022-05-01 DIAGNOSIS — N179 Acute kidney failure, unspecified: Secondary | ICD-10-CM | POA: Diagnosis not present

## 2022-05-01 DIAGNOSIS — W19XXXA Unspecified fall, initial encounter: Secondary | ICD-10-CM | POA: Diagnosis not present

## 2022-05-04 DIAGNOSIS — E039 Hypothyroidism, unspecified: Secondary | ICD-10-CM | POA: Diagnosis not present

## 2022-05-04 DIAGNOSIS — I5040 Unspecified combined systolic (congestive) and diastolic (congestive) heart failure: Secondary | ICD-10-CM | POA: Diagnosis not present

## 2022-05-04 DIAGNOSIS — E119 Type 2 diabetes mellitus without complications: Secondary | ICD-10-CM | POA: Diagnosis not present

## 2022-05-04 DIAGNOSIS — F419 Anxiety disorder, unspecified: Secondary | ICD-10-CM | POA: Diagnosis not present

## 2022-05-04 DIAGNOSIS — I10 Essential (primary) hypertension: Secondary | ICD-10-CM | POA: Diagnosis not present

## 2022-05-04 DIAGNOSIS — D649 Anemia, unspecified: Secondary | ICD-10-CM | POA: Diagnosis not present

## 2022-05-04 DIAGNOSIS — I4891 Unspecified atrial fibrillation: Secondary | ICD-10-CM | POA: Diagnosis not present

## 2022-05-06 DIAGNOSIS — J0140 Acute pansinusitis, unspecified: Secondary | ICD-10-CM | POA: Diagnosis not present

## 2022-05-06 DIAGNOSIS — F419 Anxiety disorder, unspecified: Secondary | ICD-10-CM | POA: Diagnosis not present

## 2022-05-06 DIAGNOSIS — I4891 Unspecified atrial fibrillation: Secondary | ICD-10-CM | POA: Diagnosis not present

## 2022-05-06 DIAGNOSIS — I10 Essential (primary) hypertension: Secondary | ICD-10-CM | POA: Diagnosis not present

## 2022-05-06 DIAGNOSIS — I5040 Unspecified combined systolic (congestive) and diastolic (congestive) heart failure: Secondary | ICD-10-CM | POA: Diagnosis not present

## 2022-05-08 DIAGNOSIS — I10 Essential (primary) hypertension: Secondary | ICD-10-CM | POA: Diagnosis not present

## 2022-05-08 DIAGNOSIS — I4891 Unspecified atrial fibrillation: Secondary | ICD-10-CM | POA: Diagnosis not present

## 2022-05-08 DIAGNOSIS — F419 Anxiety disorder, unspecified: Secondary | ICD-10-CM | POA: Diagnosis not present

## 2022-05-08 DIAGNOSIS — I5040 Unspecified combined systolic (congestive) and diastolic (congestive) heart failure: Secondary | ICD-10-CM | POA: Diagnosis not present

## 2022-05-08 DIAGNOSIS — K5900 Constipation, unspecified: Secondary | ICD-10-CM | POA: Diagnosis not present

## 2022-05-09 ENCOUNTER — Ambulatory Visit: Payer: Medicare HMO | Admitting: Cardiovascular Disease

## 2022-05-13 DIAGNOSIS — F419 Anxiety disorder, unspecified: Secondary | ICD-10-CM | POA: Diagnosis not present

## 2022-05-13 DIAGNOSIS — I10 Essential (primary) hypertension: Secondary | ICD-10-CM | POA: Diagnosis not present

## 2022-05-13 DIAGNOSIS — I5040 Unspecified combined systolic (congestive) and diastolic (congestive) heart failure: Secondary | ICD-10-CM | POA: Diagnosis not present

## 2022-05-13 DIAGNOSIS — I4891 Unspecified atrial fibrillation: Secondary | ICD-10-CM | POA: Diagnosis not present

## 2022-05-13 DIAGNOSIS — R339 Retention of urine, unspecified: Secondary | ICD-10-CM | POA: Diagnosis not present

## 2022-05-17 DIAGNOSIS — F419 Anxiety disorder, unspecified: Secondary | ICD-10-CM | POA: Diagnosis not present

## 2022-05-17 DIAGNOSIS — I5040 Unspecified combined systolic (congestive) and diastolic (congestive) heart failure: Secondary | ICD-10-CM | POA: Diagnosis not present

## 2022-05-17 DIAGNOSIS — N390 Urinary tract infection, site not specified: Secondary | ICD-10-CM | POA: Diagnosis not present

## 2022-05-17 DIAGNOSIS — I10 Essential (primary) hypertension: Secondary | ICD-10-CM | POA: Diagnosis not present

## 2022-05-17 DIAGNOSIS — I4891 Unspecified atrial fibrillation: Secondary | ICD-10-CM | POA: Diagnosis not present

## 2022-05-21 DIAGNOSIS — F331 Major depressive disorder, recurrent, moderate: Secondary | ICD-10-CM | POA: Diagnosis not present

## 2022-05-21 DIAGNOSIS — F419 Anxiety disorder, unspecified: Secondary | ICD-10-CM | POA: Diagnosis not present

## 2022-05-22 DIAGNOSIS — I10 Essential (primary) hypertension: Secondary | ICD-10-CM | POA: Diagnosis not present

## 2022-05-22 DIAGNOSIS — I4891 Unspecified atrial fibrillation: Secondary | ICD-10-CM | POA: Diagnosis not present

## 2022-05-22 DIAGNOSIS — I5040 Unspecified combined systolic (congestive) and diastolic (congestive) heart failure: Secondary | ICD-10-CM | POA: Diagnosis not present

## 2022-05-22 DIAGNOSIS — F419 Anxiety disorder, unspecified: Secondary | ICD-10-CM | POA: Diagnosis not present

## 2022-05-22 DIAGNOSIS — N390 Urinary tract infection, site not specified: Secondary | ICD-10-CM | POA: Diagnosis not present

## 2022-05-24 DIAGNOSIS — I10 Essential (primary) hypertension: Secondary | ICD-10-CM | POA: Diagnosis not present

## 2022-05-24 DIAGNOSIS — F419 Anxiety disorder, unspecified: Secondary | ICD-10-CM | POA: Diagnosis not present

## 2022-05-24 DIAGNOSIS — I4891 Unspecified atrial fibrillation: Secondary | ICD-10-CM | POA: Diagnosis not present

## 2022-05-24 DIAGNOSIS — I5040 Unspecified combined systolic (congestive) and diastolic (congestive) heart failure: Secondary | ICD-10-CM | POA: Diagnosis not present

## 2022-05-24 DIAGNOSIS — N390 Urinary tract infection, site not specified: Secondary | ICD-10-CM | POA: Diagnosis not present

## 2022-05-27 DIAGNOSIS — I10 Essential (primary) hypertension: Secondary | ICD-10-CM | POA: Diagnosis not present

## 2022-05-27 DIAGNOSIS — I4891 Unspecified atrial fibrillation: Secondary | ICD-10-CM | POA: Diagnosis not present

## 2022-05-27 DIAGNOSIS — R339 Retention of urine, unspecified: Secondary | ICD-10-CM | POA: Diagnosis not present

## 2022-05-27 DIAGNOSIS — F419 Anxiety disorder, unspecified: Secondary | ICD-10-CM | POA: Diagnosis not present

## 2022-05-27 DIAGNOSIS — I5040 Unspecified combined systolic (congestive) and diastolic (congestive) heart failure: Secondary | ICD-10-CM | POA: Diagnosis not present

## 2022-05-29 DIAGNOSIS — I4891 Unspecified atrial fibrillation: Secondary | ICD-10-CM | POA: Diagnosis not present

## 2022-05-29 DIAGNOSIS — R339 Retention of urine, unspecified: Secondary | ICD-10-CM | POA: Diagnosis not present

## 2022-05-29 DIAGNOSIS — I10 Essential (primary) hypertension: Secondary | ICD-10-CM | POA: Diagnosis not present

## 2022-05-29 DIAGNOSIS — N390 Urinary tract infection, site not specified: Secondary | ICD-10-CM | POA: Diagnosis not present

## 2022-05-29 DIAGNOSIS — I5040 Unspecified combined systolic (congestive) and diastolic (congestive) heart failure: Secondary | ICD-10-CM | POA: Diagnosis not present

## 2022-05-29 DIAGNOSIS — F419 Anxiety disorder, unspecified: Secondary | ICD-10-CM | POA: Diagnosis not present

## 2022-05-31 DIAGNOSIS — I10 Essential (primary) hypertension: Secondary | ICD-10-CM | POA: Diagnosis not present

## 2022-05-31 DIAGNOSIS — I5040 Unspecified combined systolic (congestive) and diastolic (congestive) heart failure: Secondary | ICD-10-CM | POA: Diagnosis not present

## 2022-05-31 DIAGNOSIS — F419 Anxiety disorder, unspecified: Secondary | ICD-10-CM | POA: Diagnosis not present

## 2022-05-31 DIAGNOSIS — N390 Urinary tract infection, site not specified: Secondary | ICD-10-CM | POA: Diagnosis not present

## 2022-05-31 DIAGNOSIS — I4891 Unspecified atrial fibrillation: Secondary | ICD-10-CM | POA: Diagnosis not present

## 2022-05-31 DIAGNOSIS — E876 Hypokalemia: Secondary | ICD-10-CM | POA: Diagnosis not present

## 2022-06-03 DIAGNOSIS — F419 Anxiety disorder, unspecified: Secondary | ICD-10-CM | POA: Diagnosis not present

## 2022-06-03 DIAGNOSIS — N390 Urinary tract infection, site not specified: Secondary | ICD-10-CM | POA: Diagnosis not present

## 2022-06-03 DIAGNOSIS — I5040 Unspecified combined systolic (congestive) and diastolic (congestive) heart failure: Secondary | ICD-10-CM | POA: Diagnosis not present

## 2022-06-03 DIAGNOSIS — I10 Essential (primary) hypertension: Secondary | ICD-10-CM | POA: Diagnosis not present

## 2022-06-03 DIAGNOSIS — I4891 Unspecified atrial fibrillation: Secondary | ICD-10-CM | POA: Diagnosis not present

## 2022-06-03 DIAGNOSIS — M1990 Unspecified osteoarthritis, unspecified site: Secondary | ICD-10-CM | POA: Diagnosis not present

## 2022-06-05 ENCOUNTER — Emergency Department (HOSPITAL_COMMUNITY): Payer: Medicare HMO

## 2022-06-05 ENCOUNTER — Other Ambulatory Visit: Payer: Self-pay

## 2022-06-05 ENCOUNTER — Inpatient Hospital Stay (HOSPITAL_COMMUNITY)
Admission: EM | Admit: 2022-06-05 | Discharge: 2022-06-18 | DRG: 951 | Disposition: E | Payer: Medicare HMO | Source: Skilled Nursing Facility | Attending: Internal Medicine | Admitting: Internal Medicine

## 2022-06-05 DIAGNOSIS — Z20822 Contact with and (suspected) exposure to covid-19: Secondary | ICD-10-CM | POA: Diagnosis present

## 2022-06-05 DIAGNOSIS — I5042 Chronic combined systolic (congestive) and diastolic (congestive) heart failure: Secondary | ICD-10-CM | POA: Diagnosis present

## 2022-06-05 DIAGNOSIS — I480 Paroxysmal atrial fibrillation: Secondary | ICD-10-CM | POA: Diagnosis present

## 2022-06-05 DIAGNOSIS — Z79890 Hormone replacement therapy (postmenopausal): Secondary | ICD-10-CM

## 2022-06-05 DIAGNOSIS — Y731 Therapeutic (nonsurgical) and rehabilitative gastroenterology and urology devices associated with adverse incidents: Secondary | ICD-10-CM | POA: Diagnosis present

## 2022-06-05 DIAGNOSIS — Z515 Encounter for palliative care: Secondary | ICD-10-CM | POA: Diagnosis not present

## 2022-06-05 DIAGNOSIS — I447 Left bundle-branch block, unspecified: Secondary | ICD-10-CM | POA: Diagnosis present

## 2022-06-05 DIAGNOSIS — A419 Sepsis, unspecified organism: Secondary | ICD-10-CM | POA: Diagnosis not present

## 2022-06-05 DIAGNOSIS — N1831 Chronic kidney disease, stage 3a: Secondary | ICD-10-CM | POA: Diagnosis present

## 2022-06-05 DIAGNOSIS — Z79899 Other long term (current) drug therapy: Secondary | ICD-10-CM | POA: Diagnosis not present

## 2022-06-05 DIAGNOSIS — H548 Legal blindness, as defined in USA: Secondary | ICD-10-CM | POA: Diagnosis present

## 2022-06-05 DIAGNOSIS — Z66 Do not resuscitate: Secondary | ICD-10-CM | POA: Diagnosis not present

## 2022-06-05 DIAGNOSIS — T83511A Infection and inflammatory reaction due to indwelling urethral catheter, initial encounter: Secondary | ICD-10-CM | POA: Diagnosis not present

## 2022-06-05 DIAGNOSIS — K5730 Diverticulosis of large intestine without perforation or abscess without bleeding: Secondary | ICD-10-CM | POA: Diagnosis not present

## 2022-06-05 DIAGNOSIS — K8689 Other specified diseases of pancreas: Secondary | ICD-10-CM | POA: Diagnosis not present

## 2022-06-05 DIAGNOSIS — Z806 Family history of leukemia: Secondary | ICD-10-CM

## 2022-06-05 DIAGNOSIS — B9620 Unspecified Escherichia coli [E. coli] as the cause of diseases classified elsewhere: Secondary | ICD-10-CM | POA: Diagnosis present

## 2022-06-05 DIAGNOSIS — R5383 Other fatigue: Secondary | ICD-10-CM | POA: Diagnosis not present

## 2022-06-05 DIAGNOSIS — R0602 Shortness of breath: Secondary | ICD-10-CM | POA: Diagnosis not present

## 2022-06-05 DIAGNOSIS — R062 Wheezing: Secondary | ICD-10-CM | POA: Diagnosis not present

## 2022-06-05 DIAGNOSIS — L89306 Pressure-induced deep tissue damage of unspecified buttock: Secondary | ICD-10-CM | POA: Diagnosis present

## 2022-06-05 DIAGNOSIS — Z8249 Family history of ischemic heart disease and other diseases of the circulatory system: Secondary | ICD-10-CM

## 2022-06-05 DIAGNOSIS — R000 Tachycardia, unspecified: Secondary | ICD-10-CM

## 2022-06-05 DIAGNOSIS — N179 Acute kidney failure, unspecified: Secondary | ICD-10-CM | POA: Diagnosis not present

## 2022-06-05 DIAGNOSIS — N390 Urinary tract infection, site not specified: Secondary | ICD-10-CM | POA: Diagnosis present

## 2022-06-05 DIAGNOSIS — R079 Chest pain, unspecified: Secondary | ICD-10-CM | POA: Diagnosis not present

## 2022-06-05 DIAGNOSIS — E876 Hypokalemia: Secondary | ICD-10-CM | POA: Diagnosis present

## 2022-06-05 DIAGNOSIS — Z888 Allergy status to other drugs, medicaments and biological substances status: Secondary | ICD-10-CM

## 2022-06-05 DIAGNOSIS — I130 Hypertensive heart and chronic kidney disease with heart failure and stage 1 through stage 4 chronic kidney disease, or unspecified chronic kidney disease: Secondary | ICD-10-CM | POA: Diagnosis not present

## 2022-06-05 DIAGNOSIS — E860 Dehydration: Secondary | ICD-10-CM | POA: Diagnosis present

## 2022-06-05 DIAGNOSIS — F0390 Unspecified dementia without behavioral disturbance: Secondary | ICD-10-CM | POA: Diagnosis present

## 2022-06-05 DIAGNOSIS — K219 Gastro-esophageal reflux disease without esophagitis: Secondary | ICD-10-CM | POA: Diagnosis present

## 2022-06-05 DIAGNOSIS — Z8261 Family history of arthritis: Secondary | ICD-10-CM

## 2022-06-05 DIAGNOSIS — J90 Pleural effusion, not elsewhere classified: Secondary | ICD-10-CM | POA: Diagnosis not present

## 2022-06-05 DIAGNOSIS — J9811 Atelectasis: Secondary | ICD-10-CM | POA: Diagnosis not present

## 2022-06-05 DIAGNOSIS — Z880 Allergy status to penicillin: Secondary | ICD-10-CM | POA: Diagnosis not present

## 2022-06-05 DIAGNOSIS — E1122 Type 2 diabetes mellitus with diabetic chronic kidney disease: Secondary | ICD-10-CM | POA: Diagnosis not present

## 2022-06-05 DIAGNOSIS — R319 Hematuria, unspecified: Secondary | ICD-10-CM | POA: Diagnosis present

## 2022-06-05 DIAGNOSIS — N2889 Other specified disorders of kidney and ureter: Secondary | ICD-10-CM | POA: Diagnosis not present

## 2022-06-05 DIAGNOSIS — I959 Hypotension, unspecified: Secondary | ICD-10-CM | POA: Diagnosis not present

## 2022-06-05 DIAGNOSIS — E785 Hyperlipidemia, unspecified: Secondary | ICD-10-CM | POA: Diagnosis present

## 2022-06-05 DIAGNOSIS — R0902 Hypoxemia: Secondary | ICD-10-CM | POA: Diagnosis not present

## 2022-06-05 DIAGNOSIS — Z7982 Long term (current) use of aspirin: Secondary | ICD-10-CM

## 2022-06-05 DIAGNOSIS — I255 Ischemic cardiomyopathy: Secondary | ICD-10-CM | POA: Diagnosis present

## 2022-06-05 DIAGNOSIS — Z96642 Presence of left artificial hip joint: Secondary | ICD-10-CM | POA: Diagnosis present

## 2022-06-05 DIAGNOSIS — Z7189 Other specified counseling: Secondary | ICD-10-CM | POA: Diagnosis not present

## 2022-06-05 DIAGNOSIS — Z882 Allergy status to sulfonamides status: Secondary | ICD-10-CM

## 2022-06-05 DIAGNOSIS — R638 Other symptoms and signs concerning food and fluid intake: Secondary | ICD-10-CM | POA: Diagnosis not present

## 2022-06-05 DIAGNOSIS — Z789 Other specified health status: Secondary | ICD-10-CM | POA: Diagnosis not present

## 2022-06-05 DIAGNOSIS — Z887 Allergy status to serum and vaccine status: Secondary | ICD-10-CM

## 2022-06-05 DIAGNOSIS — R0789 Other chest pain: Secondary | ICD-10-CM | POA: Diagnosis not present

## 2022-06-05 LAB — CBC WITH DIFFERENTIAL/PLATELET
Abs Immature Granulocytes: 0.12 10*3/uL — ABNORMAL HIGH (ref 0.00–0.07)
Basophils Absolute: 0 10*3/uL (ref 0.0–0.1)
Basophils Relative: 0 %
Eosinophils Absolute: 0.7 10*3/uL — ABNORMAL HIGH (ref 0.0–0.5)
Eosinophils Relative: 5 %
HCT: 33.5 % — ABNORMAL LOW (ref 36.0–46.0)
Hemoglobin: 11.3 g/dL — ABNORMAL LOW (ref 12.0–15.0)
Immature Granulocytes: 1 %
Lymphocytes Relative: 3 %
Lymphs Abs: 0.5 10*3/uL — ABNORMAL LOW (ref 0.7–4.0)
MCH: 29.5 pg (ref 26.0–34.0)
MCHC: 33.7 g/dL (ref 30.0–36.0)
MCV: 87.5 fL (ref 80.0–100.0)
Monocytes Absolute: 0.2 10*3/uL (ref 0.1–1.0)
Monocytes Relative: 1 %
Neutro Abs: 13.9 10*3/uL — ABNORMAL HIGH (ref 1.7–7.7)
Neutrophils Relative %: 90 %
Platelets: 143 10*3/uL — ABNORMAL LOW (ref 150–400)
RBC: 3.83 MIL/uL — ABNORMAL LOW (ref 3.87–5.11)
RDW: 13.3 % (ref 11.5–15.5)
WBC: 15.5 10*3/uL — ABNORMAL HIGH (ref 4.0–10.5)
nRBC: 0 % (ref 0.0–0.2)

## 2022-06-05 LAB — RESP PANEL BY RT-PCR (FLU A&B, COVID) ARPGX2
Influenza A by PCR: NEGATIVE
Influenza B by PCR: NEGATIVE
SARS Coronavirus 2 by RT PCR: NEGATIVE

## 2022-06-05 LAB — URINALYSIS, ROUTINE W REFLEX MICROSCOPIC
Bilirubin Urine: NEGATIVE
Glucose, UA: NEGATIVE mg/dL
Ketones, ur: NEGATIVE mg/dL
Nitrite: POSITIVE — AB
Protein, ur: NEGATIVE mg/dL
Specific Gravity, Urine: 1.026 (ref 1.005–1.030)
pH: 5 (ref 5.0–8.0)

## 2022-06-05 LAB — COMPREHENSIVE METABOLIC PANEL
ALT: 14 U/L (ref 0–44)
AST: 21 U/L (ref 15–41)
Albumin: 2.4 g/dL — ABNORMAL LOW (ref 3.5–5.0)
Alkaline Phosphatase: 73 U/L (ref 38–126)
Anion gap: 14 (ref 5–15)
BUN: 20 mg/dL (ref 8–23)
CO2: 27 mmol/L (ref 22–32)
Calcium: 8.6 mg/dL — ABNORMAL LOW (ref 8.9–10.3)
Chloride: 97 mmol/L — ABNORMAL LOW (ref 98–111)
Creatinine, Ser: 1.28 mg/dL — ABNORMAL HIGH (ref 0.44–1.00)
GFR, Estimated: 38 mL/min — ABNORMAL LOW (ref >60)
Glucose, Bld: 169 mg/dL — ABNORMAL HIGH (ref 70–99)
Potassium: 2.9 mmol/L — ABNORMAL LOW (ref 3.5–5.1)
Sodium: 138 mmol/L (ref 135–145)
Total Bilirubin: 0.8 mg/dL (ref 0.3–1.2)
Total Protein: 6 g/dL — ABNORMAL LOW (ref 6.5–8.1)

## 2022-06-05 LAB — TROPONIN I (HIGH SENSITIVITY)
Troponin I (High Sensitivity): 12 ng/L (ref <18)
Troponin I (High Sensitivity): 13 ng/L (ref <18)

## 2022-06-05 LAB — BRAIN NATRIURETIC PEPTIDE: B Natriuretic Peptide: 72.2 pg/mL (ref 0.0–100.0)

## 2022-06-05 MED ORDER — METOPROLOL TARTRATE 5 MG/5ML IV SOLN
5.0000 mg | Freq: Once | INTRAVENOUS | Status: DC
  Filled 2022-06-05: qty 5

## 2022-06-05 MED ORDER — IOHEXOL 350 MG/ML SOLN
50.0000 mL | Freq: Once | INTRAVENOUS | Status: AC | PRN
  Administered 2022-06-05: 50 mL via INTRAVENOUS

## 2022-06-05 MED ORDER — POTASSIUM CHLORIDE CRYS ER 20 MEQ PO TBCR
40.0000 meq | EXTENDED_RELEASE_TABLET | Freq: Once | ORAL | Status: AC
  Administered 2022-06-05: 40 meq via ORAL
  Filled 2022-06-05: qty 2

## 2022-06-05 MED ORDER — HALOPERIDOL 0.5 MG PO TABS: 0.5000 mg | ORAL_TABLET | ORAL | Status: DC | PRN

## 2022-06-05 MED ORDER — POLYVINYL ALCOHOL 1.4 % OP SOLN: 1.0000 [drp] | Freq: Four times a day (QID) | OPHTHALMIC | Status: DC | PRN

## 2022-06-05 MED ORDER — BIOTENE DRY MOUTH MT LIQD: 15.0000 mL | OROMUCOSAL | Status: DC | PRN

## 2022-06-05 MED ORDER — ACETAMINOPHEN 325 MG PO TABS: 650.0000 mg | ORAL_TABLET | Freq: Four times a day (QID) | ORAL | Status: DC | PRN

## 2022-06-05 MED ORDER — AMIODARONE LOAD VIA INFUSION
150.0000 mg | Freq: Once | INTRAVENOUS | Status: DC
  Filled 2022-06-05: qty 83.34

## 2022-06-05 MED ORDER — GLYCOPYRROLATE 0.2 MG/ML IJ SOLN
0.2000 mg | INTRAMUSCULAR | Status: DC | PRN
  Administered 2022-06-07: 0.2 mg via INTRAVENOUS
  Filled 2022-06-05: qty 1

## 2022-06-05 MED ORDER — MORPHINE BOLUS VIA INFUSION: 2.0000 mg | INTRAVENOUS | Status: DC | PRN

## 2022-06-05 MED ORDER — ACETAMINOPHEN 650 MG RE SUPP
650.0000 mg | Freq: Four times a day (QID) | RECTAL | Status: DC | PRN
  Administered 2022-06-06 (×2): 650 mg via RECTAL
  Filled 2022-06-05 (×2): qty 1

## 2022-06-05 MED ORDER — SODIUM CHLORIDE 0.9 % IV BOLUS
1000.0000 mL | Freq: Once | INTRAVENOUS | Status: AC
  Administered 2022-06-05: 1000 mL via INTRAVENOUS

## 2022-06-05 MED ORDER — AMIODARONE HCL IN DEXTROSE 360-4.14 MG/200ML-% IV SOLN: 30.0000 mg/h | INTRAVENOUS | Status: DC

## 2022-06-05 MED ORDER — FAMOTIDINE IN NACL 20-0.9 MG/50ML-% IV SOLN
20.0000 mg | Freq: Once | INTRAVENOUS | Status: AC
  Administered 2022-06-05: 20 mg via INTRAVENOUS
  Filled 2022-06-05: qty 50

## 2022-06-05 MED ORDER — METOPROLOL TARTRATE 25 MG PO TABS: 25.0000 mg | ORAL_TABLET | Freq: Two times a day (BID) | ORAL | Status: DC

## 2022-06-05 MED ORDER — MORPHINE 100MG IN NS 100ML (1MG/ML) PREMIX INFUSION
5.0000 mg/h | INTRAVENOUS | Status: DC
  Administered 2022-06-05 – 2022-06-06 (×2): 5 mg/h via INTRAVENOUS
  Filled 2022-06-05 (×2): qty 100

## 2022-06-05 MED ORDER — SODIUM CHLORIDE 0.9 % IV BOLUS
500.0000 mL | Freq: Once | INTRAVENOUS | Status: AC
Start: 2022-06-05 — End: 2022-06-05
  Administered 2022-06-05: 500 mL via INTRAVENOUS

## 2022-06-05 MED ORDER — ONDANSETRON 4 MG PO TBDP: 4.0000 mg | ORAL_TABLET | Freq: Four times a day (QID) | ORAL | Status: DC | PRN

## 2022-06-05 MED ORDER — GLYCOPYRROLATE 1 MG PO TABS: 1.0000 mg | ORAL_TABLET | ORAL | Status: DC | PRN

## 2022-06-05 MED ORDER — DIPHENHYDRAMINE HCL 50 MG/ML IJ SOLN: 12.5000 mg | INTRAMUSCULAR | Status: DC | PRN

## 2022-06-05 MED ORDER — IPRATROPIUM-ALBUTEROL 0.5-2.5 (3) MG/3ML IN SOLN
3.0000 mL | Freq: Once | RESPIRATORY_TRACT | Status: AC
  Administered 2022-06-05: 3 mL via RESPIRATORY_TRACT
  Filled 2022-06-05 (×2): qty 3

## 2022-06-05 MED ORDER — AMIODARONE HCL IN DEXTROSE 360-4.14 MG/200ML-% IV SOLN: 60.0000 mg/h | INTRAVENOUS | Status: DC

## 2022-06-05 MED ORDER — METOPROLOL TARTRATE 5 MG/5ML IV SOLN
2.5000 mg | Freq: Once | INTRAVENOUS | Status: AC
  Administered 2022-06-05: 2.5 mg via INTRAVENOUS

## 2022-06-05 MED ORDER — SODIUM CHLORIDE 0.9 % IV SOLN
1.0000 g | Freq: Once | INTRAVENOUS | Status: AC
  Administered 2022-06-05: 1 g via INTRAVENOUS
  Filled 2022-06-05: qty 10

## 2022-06-05 MED ORDER — LORAZEPAM 1 MG PO TABS: 1.0000 mg | ORAL_TABLET | ORAL | Status: DC | PRN

## 2022-06-05 MED ORDER — ONDANSETRON HCL 4 MG/2ML IJ SOLN
4.0000 mg | Freq: Four times a day (QID) | INTRAMUSCULAR | Status: DC | PRN
  Administered 2022-06-05: 4 mg via INTRAVENOUS

## 2022-06-05 MED ORDER — SODIUM CHLORIDE 0.9 % IV SOLN: INTRAVENOUS | Status: DC

## 2022-06-05 MED ORDER — HALOPERIDOL LACTATE 2 MG/ML PO CONC
0.5000 mg | ORAL | Status: DC | PRN
Start: 2022-06-05 — End: 2022-06-06

## 2022-06-05 MED ORDER — ONDANSETRON HCL 4 MG/2ML IJ SOLN: 4.0000 mg | Freq: Four times a day (QID) | INTRAMUSCULAR | Status: DC | PRN

## 2022-06-05 MED ORDER — LORAZEPAM 2 MG/ML PO CONC
1.0000 mg | ORAL | Status: DC | PRN
Start: 2022-06-05 — End: 2022-06-06

## 2022-06-05 MED ORDER — HALOPERIDOL LACTATE 5 MG/ML IJ SOLN
0.5000 mg | INTRAMUSCULAR | Status: DC | PRN
  Filled 2022-06-05: qty 1

## 2022-06-05 MED ORDER — ONDANSETRON HCL 4 MG/2ML IJ SOLN
4.0000 mg | Freq: Once | INTRAMUSCULAR | Status: AC
  Administered 2022-06-05: 4 mg via INTRAVENOUS
  Filled 2022-06-05: qty 2

## 2022-06-05 MED ORDER — GLYCOPYRROLATE 0.2 MG/ML IJ SOLN: 0.2000 mg | INTRAMUSCULAR | Status: DC | PRN

## 2022-06-05 MED ORDER — LORAZEPAM 2 MG/ML IJ SOLN
1.0000 mg | INTRAMUSCULAR | Status: DC | PRN
  Administered 2022-06-05 – 2022-06-06 (×2): 1 mg via INTRAVENOUS
  Filled 2022-06-05 (×2): qty 1

## 2022-06-05 NOTE — ED Provider Notes (Signed)
  Provider Note MRN:  [id number]  Arrival date & time: 06-16-2022    ED Course and Medical Decision Making  Assumed care from Polina at shift change.  See note from prior team for complete details, in brief:  88 yo female  Hx afib, LBBB, systolic HF, CKD stage 3 To ED with dib K 2.9, WBC 15.5, Hgb 11.3 (similar to prior) CTPE pending  Plan per prior physician follow up imaging    Clinical Course as of 06-16-22 1452  Wed 06/16/2022  1028 Patient with nausea, gagging.  Minimal emesis but appears more so sputum or phlegm.  She has mild epigastric discomfort on palpation.  We will give patient antiemetic, nebulized breathing treatment to include loosen some of her phlegm.  Given the abdominal pain in the nausea will obtain a CT abdomen.  She has leukocytosis, she has indwelling catheter and she mentioned that she had some dysuria, thinks she might of been treated for UTI but she is not sure.  Will obtain urinalysis. [SG]  1305 Patient heart rate 160s 170s [SG]  1332 Heart rate improved IV fluids; patient is septic, continue IV fluid resuscitation [SG]  1333 Spoke with Dr. Anne Fu cardiology, discussed management.  Recommend IV fluid resuscitation, small dose of metoprolol.  If not improved then small dose of amiodarone would be reasonable. They will see her in consult. [SG]  1426 HR improved after IVF and 2.5 mg of lopressor IV. Will plan admit to urosepsis; continue IVF [SG]    Clinical Course User Index [SG] Tanda Rockers A, DO   CT abdomen pelvis IV contrast demonstrates diverticulosis without diverticulitis.  Patient with sepsis likely secondary to urinary tract infection.  She has indwelling catheter.  She has vomiting.  She has some flank pain but no evidence of pyelonephritis on imaging.  Start patient on Rocephin.  Send urine culture. Antiemetics as needed.  IV fluids.  Recommend admission.  Patient agreeable.  Discussed with Dr. Ophelia Charter accept patient admission.  .Critical  Care  Performed by: Sloan Leiter, DO Authorized by: Sloan Leiter, DO   Critical care provider statement:    Critical care time (minutes):  30   Critical care time was exclusive of:  Separately billable procedures and treating other patients   Critical care was necessary to treat or prevent imminent or life-threatening deterioration of the following conditions:  Sepsis   Critical care was time spent personally by me on the following activities:  Development of treatment plan with patient or surrogate, discussions with consultants, evaluation of patient's response to treatment, examination of patient, ordering and review of laboratory studies, ordering and review of radiographic studies, ordering and performing treatments and interventions, pulse oximetry, re-evaluation of patient's condition, review of old charts and obtaining history from patient or surrogate   Care discussed with: admitting provider     Final Clinical Impressions(s) / ED Diagnoses     ICD-10-CM   1. Urinary tract infection with hematuria, site unspecified  N39.0    R31.9     2. Sepsis, due to unspecified organism, unspecified whether acute organ dysfunction present (HCC)  A41.9     3. Hypokalemia  E87.6       ED Discharge Orders     None       Discharge Instructions   None        Sloan Leiter, DO Jun 16, 2022 1453

## 2022-06-05 NOTE — ED Notes (Signed)
Pt dry heaving and coughing up mucous, had one episode of vomiting bile like fluids. Pt given suction to suction her mouth. Pt also having diarrhea.

## 2022-06-05 NOTE — ED Notes (Signed)
Dr.Yates notified of pt HR ranging from 131-135bpm. Per MD, pt is on comfort care measures. Pt is very anxious and constantly moves on bed. Redirected by staff multiple times. MD is aware. Will continue to monitor.

## 2022-06-05 NOTE — ED Notes (Signed)
XR at bedside

## 2022-06-05 NOTE — ED Notes (Signed)
Called lab requesting update on BNP collected at 4:19. Per lab BNP running at this time. Unable to give estimated time for results

## 2022-06-05 NOTE — ED Notes (Signed)
Pt was de-states to 88% on room air. Not on chronic supplemental oxygen. Placed on 2 L nasal cannula with SPO2 improvement to 96%.

## 2022-06-05 NOTE — Consult Note (Addendum)
Cardiology Consultation:   Patient ID: ALISYN LEQUIRE MRN: [id number]; DOB: 07-Mar-1923  Admit date: 06/04/2022 Date of Consult: 06/03/2022  PCP:  Mayra Neer, MD   [facility] Providers Cardiologist:  Jenkins Rouge, MD   {   Patient Profile:   [name] is a 88 y.o. female with a hx of type 2 DM, CKD III, HTN, chronic systolic heart failure, NICM, LBBB, paroxysmal atrial fib (not on anticoagulation due to fall risk), hypothyroidism, pancreatic mass, anxiety with depression, who is being seen 06/04/2022 for the evaluation of sinus tachycardia at the request of Dr. Lorin Mercy.  History of Present Illness:   Ms. Mowbray with above PMH who presented to ER with nausea, mild emesis, abdominal pain, dysuria per ED provider reports. She is a poor historian, upon exam, she states she is feeling very dry, has not eating anything for 3 days. She is having trouble sleeping. She feels terrible. She has a chronic Foley catheter. She does not recall having any nausea, abdominal pain, chest pain. She appeared SOB on exam. She is not able to elaborate history. ED RN notes report patient had dry heaving, coughing, vomiting, and diarrhea so far.   Diagnostic showed hypokalemia 2.9, Cr 1.28, GFR 38, albumin 2.4. BNP 72. Hs trop negative x2. CBC with leukocytosis WBC 15.5K. Hgb 11.3. PLT 143k. UA with evidence of infection. Urine culture pending. CXR showed small bilateral pleural effusions with atelectasis at lung bases.CTA no PE. CTAP  without acute intra-abdominal infectious process. Her heart rate was 160-170s at ED. She was concerned for sepsis 2/2 UTI. Tachycardia had resolved after IVF bolus and 2.62m metoprolol at ED. Cardiology is consulted for tachycardia.   She was recently hospitalized here from 04/16/22 to 04/25/22 for AKI on CKD III due to diarrhea from laxatives use. Her Cr peaked 2.8 and back to 1.67 at discharge. She was treated for UTI with ceftriaxone. She was delirious that was  treated with Seroquel.    She follows Dr NJohnsie Canceloutpatient, has NICM, EF down to 20% in 2009, recovered to 60-65% on Echo 04/14/16, last Echo with LVEF back down to 30-35%. Cardiac cath from 07/2008 showed no significant CAD, LVEF 15-20%. She is historically maintained on GDMT with Coreg 6.242mBID, spironolactone 12.55m40maily, torsemide 73m39mily. She has chronic LBBB and history of reported paroxysmal atrial fibrillation, she had no recurrence for years, anticoagulation was not pursued due to fall risk and advanced age per cardiology office visit documentation.   She was admitted for acute CHF 03/2021, Echo showed LVEF 30-35%, wall motion is difficult to assess but likely apical/inferior akinesis, moderate asymmetric hypertrophy, and no significant valvular abnormalities. She was given diuresis with IV lasix, not interested in any aggressive or  invasive cardiac workup, trop were negative. She was sent home with losartan, coreg, spironolactone, and torsemide. Losartan was stopped due to recent hospitalization for AKI.        Past Medical History:  Diagnosis Date   Atrial fibrillation (HCC)[place] Cholelithiasis    Constipation    Diabetes mellitus    Dizziness    with some gait disability   GERD (gastroesophageal reflux disease)    Hepatic cyst    Hyperlipidemia    Hypertension    Ischemic cardiomyopathy    LBBB (left bundle branch block)    Macular degeneration    Other postprocedural status(V45.89)    Peripheral edema     Past Surgical History:  Procedure Laterality Date   ANKLE SURGERY Bilateral  fractures   CHOLECYSTECTOMY     Dilation and curettage     HIP ARTHROPLASTY  05/05/2012   Procedure: ARTHROPLASTY BIPOLAR HIP;  Surgeon: Mauri Pole, MD;  Location: [facility];  Service: Orthopedics;  Laterality: Left;   TONSILLECTOMY     WRIST SURGERY Bilateral    wrist fractures     Home Medications:  Prior to Admission medications   Medication Sig Start Date End Date Taking?  Authorizing Provider  acetaminophen (TYLENOL) 500 MG tablet Take 500 mg by mouth daily as needed for moderate pain.    [provider]  albuterol (VENTOLIN HFA) 108 (90 Base) MCG/ACT inhaler Inhale 2 puffs into the lungs every 4 (four) hours as needed for wheezing or shortness of breath.    [provider]  ALPRAZolam Duanne Moron) 1 MG tablet Take 1 tablet (1 mg total) by mouth 3 (three) times daily as needed for anxiety. 04/23/22   Darliss Cheney, MD  Alum & Mag Hydroxide-Simeth (GERI-LANTA PO) Take 30 mLs by mouth in the morning and at bedtime.    [provider]  aspirin 81 MG chewable tablet Chew 81 mg by mouth daily.    [provider]  Benzocaine-Menthol (CHLORASEPTIC SORE THROAT MT) Use as directed 2 sprays in the mouth or throat every 4 (four) hours as needed (sore throat).    [provider]  benzonatate (TESSALON) 100 MG capsule Take 100 mg by mouth 3 (three) times daily as needed for cough.    [provider]  bisacodyl (DULCOLAX) 5 MG EC tablet Take 10 mg by mouth daily as needed for moderate constipation.    [provider]  buPROPion (WELLBUTRIN XL) 150 MG 24 hr tablet Take 150 mg by mouth daily.    [provider]  carvedilol (COREG) 6.25 MG tablet Take 6.25 mg by mouth 2 (two) times daily. 09/03/17   [provider]  colestipol (COLESTID) 1 g tablet Take 2 tablets (2 g total) by mouth daily. Hold FOR CONSTIPATION 12/11/17   Kayleen Memos, DO  docusate sodium (COLACE) 100 MG capsule Take 100 mg by mouth 2 (two) times daily.    [provider]  Emollient (EUCERIN ORIGINAL HEALING) LOTN Apply 1 application. topically daily. Apply to arms,legs and back    [provider]  ferrous sulfate 325 (65 FE) MG tablet Take 1 tablet (325 mg total) by mouth daily with breakfast. 04/12/21   Nita Sells, MD  guaiFENesin (MUCINEX) 600 MG 12 hr tablet Take 600 mg by mouth 2 (two) times daily as needed for  cough (congestion).    [provider]  guaifenesin (ROBITUSSIN) 100 MG/5ML syrup Take 100 mg by mouth every 8 (eight) hours as needed for cough.    [provider]  HYDROcodone-acetaminophen (NORCO/VICODIN) 5-325 MG tablet Take 1 tablet by mouth every 12 (twelve) hours as needed for moderate pain. 04/23/22   Darliss Cheney, MD  hydrOXYzine (ATARAX) 10 MG tablet Take 10 mg by mouth 3 (three) times daily as needed for itching.    [provider]  Hypertonic Nasal Wash (SINUS RINSE KIT) PACK Place 1 each into the nose every hour as needed (congestion).    [provider]  lactobacillus acidophilus & bulgar (LACTINEX) chewable tablet Chew 1 tablet by mouth daily.    [provider]  levothyroxine (SYNTHROID, LEVOTHROID) 25 MCG tablet Take 25 mcg by mouth daily before breakfast.    [provider]  loperamide (IMODIUM A-D) 2 MG tablet Take 2-4  mg by mouth See admin instructions. 4 mg after first loose stool, then 2 mg after each loose stool    [provider]  losartan (COZAAR) 50 MG tablet Take 0.5 tablets (25 mg total) by mouth daily. 04/11/21   Nita Sells, MD  magnesium hydroxide (MILK OF MAGNESIA) 400 MG/5ML suspension Take 15 mLs by mouth daily as needed for mild constipation.    [provider]  Menthol, Topical Analgesic, (BIOFREEZE) 4 % GEL Apply 1 application topically 3 (three) times daily as needed (bilateral hip pain).    [provider]  mirabegron ER (MYRBETRIQ) 25 MG TB24 tablet Take 25 mg by mouth daily.    [provider]  mirtazapine (REMERON) 30 MG tablet Take 1 tablet (30 mg total) by mouth at bedtime. 04/11/21   Nita Sells, MD  Multiple Vitamins-Minerals (PRESERVISION AREDS PO) Take 1 capsule by mouth daily.    [provider]  mupirocin ointment (BACTROBAN) 2 % Apply 1 application. topically daily.    [provider]  NON FORMULARY Take 2.5 mLs by mouth 3 (three)  times daily as needed (sore throat). Salt and water solution    [provider]  ondansetron (ZOFRAN) 4 MG tablet Take 4 mg by mouth daily as needed for nausea or vomiting.    [provider]  pantoprazole (PROTONIX) 40 MG tablet Take 1 tablet (40 mg total) by mouth 2 (two) times daily. 04/11/21   Nita Sells, MD  polyethylene glycol powder (GLYCOLAX/MIRALAX) 17 GM/SCOOP powder Take 17 g by mouth daily.    [provider]  Powders (GOLD BOND BABY POWDER EX) Apply 1 application. topically 2 (two) times daily as needed (itching).    [provider]  Pramoxine-Camphor-Zinc Acetate (ANTI ITCH EX) Apply 1 application topically 3 (three) times daily as needed (itching).    [provider]  sertraline (ZOLOFT) 100 MG tablet Take 1 tablet (100 mg total) by mouth daily. Patient taking differently: Take 200 mg by mouth daily. 04/11/21   Nita Sells, MD  simvastatin (ZOCOR) 40 MG tablet Take 40 mg by mouth at bedtime.    [provider]  spironolactone (ALDACTONE) 25 MG tablet Take 0.5 tablets (12.5 mg total) by mouth daily. 02/21/17   Josue Hector, MD  torsemide (DEMADEX) 10 MG tablet Take 1 tablet (10 mg total) by mouth daily. 04/12/21   Nita Sells, MD  torsemide (DEMADEX) 20 MG tablet Take 20 mg by mouth daily as needed (leg swelling).    [provider]  triamcinolone cream (KENALOG) 0.1 % 1 application 2 (two) times daily as needed (itching). perineum    [provider]  Zinc Oxide (DESITIN) 13 % CREA Apply 1 application topically See admin instructions. Cleanse groin area under breast with soap and water, pat dry and apply ointment on area as needed for redness.    [provider]    Inpatient Medications: Scheduled Meds:  Continuous Infusions:  PRN Meds:   Allergies:    Allergies  Allergen Reactions   Fosamax [Alendronate Sodium] Swelling   Penicillins Swelling and Other (See Comments)     Swelling, redness and warmth at injection site Has patient had a PCN reaction causing immediate rash, facial/tongue/throat swelling, SOB or lightheadedness with hypotension: No Has patient had a PCN reaction causing severe rash involving mucus membranes or skin necrosis: No Has patient had a PCN reaction that required hospitalization: No Has patient had a PCN reaction occurring within the last 10 years: No If all  of the above answers are "NO", then may proceed with Cephalosporin use.   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  Does not drive             Social Determinants of Health   Financial Resource Strain: Not on file  Food Insecurity: Not on file  Transportation Needs: Not on file  Physical Activity: Not on file  Stress: Not on file  Social Connections: Not on file  Intimate Partner Violence: Not on file    Family History:    Family History  Problem Relation Age of Onset   Leukemia Mother    Arthritis Father    Heart failure Brother        died age 67   Arthritis Brother      ROS:  Difficult to obtaining as patient is poor historian   Physical Exam/Data:   Vitals:   05/25/2022 1030 05/24/2022 1130 05/29/2022 1200 06/10/2022 1400  BP: 116/70 116/68 (!) 125/96 (!) 129/57  Pulse: (!) 119 (!) 128 (!) 130 (!) 124  Resp: 19  (!) 34 (!) 24 16  Temp:    98.5 F (36.9 C)  TempSrc:    Oral  SpO2: 94% 95% 93% 96%   No intake or output data in the 24 hours ending 05/28/2022 1602    04/25/2022    9:07 AM 04/25/2022    5:00 AM 04/24/2022    5:00 AM  Last 3 Weights  Weight (lbs) 201 lb 4.5 oz 202 lb 9.6 oz 203 lb 11.3 oz  Weight (kg) 91.3 kg 91.9 kg 92.4 kg     There is no height or weight on file to calculate BMI.   Vitals:  Vitals:   06/04/2022 1200 06/16/2022 1400  BP: (!) 125/96 (!) 129/57  Pulse: (!) 130 (!) 124  Resp: (!) 24 16  Temp:  98.5 F (36.9 C)  SpO2: 93% 96%   General Appearance: In no apparent distress, laying in bed HEENT: Normocephalic, atraumatic. Oral cavity dry  Neck: Supple, trachea midline, no JVDs Cardiovascular: Regular rhythm, tachycardia, S1S2, no murmur Respiratory: Resting breathing unlabored, SOB with conversation, lungs sounds clear to auscultation bilaterally, no use of accessory muscles. On 2LNC, speaks full sentence  Gastrointestinal: Bowel sounds positive, abdomen soft Extremities: Able to move all extremities in bed without difficulty, no edema Genitourinary: Chronic Foley with yellow urine with sediments  Musculoskeletal: Normal muscle bulk and tone Skin: Intact, warm, dry. No rashes or petechiae noted in exposed areas.  Neurologic: Alert, oriented to person, place; mild cognitive deficit Psychiatric: Normal affect. Mood is appropriate.    EKG:  The EKG was personally reviewed and demonstrates:    EKG from 05/23/2022 at 0400  Sinus rhythm 96bpm, old LBBB  Telemetry:  Telemetry was personally reviewed and demonstrates:    Sinus tachycardia 120s  Relevant CV Studies:  Echo from 04/06/21:   1. Left ventricular ejection fraction, by estimation, is 30 to 35%. The  left ventricle has moderately decreased function. Wall motion difficult to  assess due to lack of definity contrast and incomplete visualization of  the LV endocardium. Based on  limited views, all mid-to-apical  septal, apical inferior, mid-to-apcial  anterior, and apex appear akinetic. Findings concerning for possible LAD  disease. There is moderate asymmetric hypertrophy of the basal septum. The  rest of the LV segments  demonstrate mild concentric left ventricular hypertrophy. Indeterminate  diastolic filling due to E-A fusion.   2. Right ventricular systolic function is normal. The right ventricular  size is normal.   3. The mitral valve is grossly normal. There is mild thickening of the  mitral valve leaflet(s). There is mild calcification of the mitral valve  leaflet(s). Mild to moderate mitral annular calcification. Trivial mitral  valve regurgitation. No evidence of   mitral stenosis.   4. The aortic valve was not well visualized. Aortic valve regurgitation  is not visualized.   5. There is flow acceleration noted at the level of the pulmonic valve  (poor visualization of the valve) with mean gradient 9mHg, peak gradient  262mg, Vmax 2.6319m Either mild PS or flow acceleration due to vigorous  RV systolic function.   Comparison(s): Compared to prior echo report, the LVEF is now moderately  reduced to 30-35% (previously 60-65%) with wall motion abnormalities as  detailed above.   Laboratory Data:  High Sensitivity Troponin:   Recent Labs  Lab 06/09/2022 0419 05/30/2022 0720  TROPONINIHS 12 13     Chemistry Recent Labs  Lab 06/16/2022 0419  NA 138  K 2.9*  CL 97*  CO2 27  GLUCOSE 169*  BUN 20  CREATININE 1.28*  CALCIUM 8.6*  GFRNONAA 38*  ANIONGAP 14    Recent Labs  Lab 05/21/2022 0419  PROT 6.0*  ALBUMIN 2.4*  AST 21  ALT 14  ALKPHOS 73  BILITOT 0.8   Lipids No results for input(s): "CHOL", "TRIG", "HDL", "LABVLDL", "LDLCALC", "CHOLHDL" in the last 168 hours.  Hematology Recent Labs  Lab 05/21/2022 0419  WBC 15.5*  RBC 3.83*  HGB 11.3*  HCT 33.5*  MCV 87.5  MCH 29.5  MCHC 33.7  RDW 13.3  PLT 143*   Thyroid No results for input(s): "TSH", "FREET4" in  the last 168 hours.  BNP Recent Labs  Lab 05/23/2022 0419  BNP 72.2    DDimer No results for input(s): "DDIMER" in the last 168 hours.   Radiology/Studies:  CT ABDOMEN PELVIS WO CONTRAST  Result Date: 05/20/2022 CLINICAL DATA:  Acute non localized abdominal pain. EXAM: CT ABDOMEN AND PELVIS WITHOUT CONTRAST TECHNIQUE: Multidetector CT imaging of the abdomen and pelvis was performed following the standard protocol without IV contrast. RADIATION DOSE REDUCTION: This exam was performed according to the departmental dose-optimization program which includes automated exposure control, adjustment of the mA and/or kV according to patient size and/or use of iterative reconstruction technique. COMPARISON:  AP chest 06/16/2022, CT abdomen and pelvis without contrast 04/17/2022 and 04/06/2021 FINDINGS: Lower chest: Small bilateral pleural effusions are new from 04/17/2022 but unchanged from CT of the chest earlier today. Mild curvilinear peripheral subpleural interlobular septal thickening/scarring is similar to prior. Coronary artery calcifications are noted. Lack of intravenous contrast limits evaluation of the solid abdominal and  pelvic organ parenchyma. Hepatobiliary: The liver demonstrates smooth contours. No gross parenchymal abnormality. Status post cholecystectomy. Pancreas: Moderate atrophy and fatty infiltration of the pancreatic head greater than body and tail. A 15 mm pancreatic head fluid attenuation and 15 mm (axial series 2, image 27) and 4 mm (axial image 27) pancreatic tail fluid attenuation cysts are not significantly changed from multiple prior CTs including 04/06/2021 measured in a similar manner. No ductal dilatation is seen. Spleen: Normal in size without focal abnormality. Adrenals/Urinary Tract: Normal bilateral adrenals. Multilevel right kidney fluid attenuation cysts measuring up to 5.4 cm anteriorly and 5.0 cm posteriorly are not significantly changed. There is mild residual contrast  within the kidney parenchyma and early contrast excretion into the renal calices bilaterally from contrast given for same day CT chest. No hydronephrosis. Streak artifact from bilateral total hip arthroplasty hardware obscures the urinary bladder. There appears to be a urinary catheter extending into the urinary bladder. Stomach/Bowel: Mild-to-moderate sigmoid and mild descending colon diverticulosis. Within the limitations of the streak artifact within the pelvis, no definite inflammatory changes are seen to indicate acute diverticulitis. Underdistention of the majority of the colon further limits evaluation of the large bowel the appendix is again not confidently identified. No dilated loops of bowel are seen to indicate bowel obstruction. Vascular/Lymphatic: No abdominal aortic aneurysm. Moderate to high-grade atherosclerotic calcifications. Reproductive: Streak artifact limits evaluation. Other: No ventral abdominal wall hernia. No free air or free fluid is seen within the abdomen or pelvis. Musculoskeletal: Mild multilevel degenerative disc space narrowing and endplate osteophytes throughout the visualized thoracolumbar spine. Status post bilateral total hip arthroplasty with associated streak artifact. IMPRESSION: 1. Small bilateral pleural effusions. 2. Coronary artery calcifications indicating coronary artery disease. 3. Mild-to-moderate sigmoid and mild descending colon diverticulosis. No definite inflammatory changes to indicate acute diverticulitis. 4. Stable cystic lesions within the pancreas measuring up to 15 mm. Aortic Atherosclerosis (ICD10-I70.0). Electronically Signed   By: Yvonne Kendall M.D.   On: 05/19/2022 12:48   CT Angio Chest Pulmonary Embolism (PE) W or WO Contrast  Result Date: 05/28/2022 CLINICAL DATA:  Pulmonary embolism (PE) suspected, high prob EXAM: CT ANGIOGRAPHY CHEST WITH CONTRAST TECHNIQUE: Multidetector CT imaging of the chest was performed using the standard protocol during  bolus administration of intravenous contrast. Multiplanar CT image reconstructions and MIPs were obtained to evaluate the vascular anatomy. RADIATION DOSE REDUCTION: This exam was performed according to the departmental dose-optimization program which includes automated exposure control, adjustment of the mA and/or kV according to patient size and/or use of iterative reconstruction technique. CONTRAST:  31m OMNIPAQUE IOHEXOL 350 MG/ML SOLN COMPARISON:  2019 FINDINGS: Cardiovascular: Satisfactory opacification of the pulmonary arteries to the segmental level. No evidence of pulmonary embolism. Normal heart size. No pericardial effusion. Coronary artery calcification. Mediastinum/Nodes: Normal thyroid.  No enlarged nodes. Lungs/Pleura: Trace pleural effusions. Dependent atelectatic changes. Similar to slight increase in size of 5 mm left upper lobe nodule (series 8, image 65). Upper Abdomen: No acute abnormality. Musculoskeletal: No acute osseous abnormality. Review of the MIP images confirms the above findings. IMPRESSION: No acute pulmonary embolism. Trace pleural effusions.  Dependent atelectasis. Electronically Signed   By: PMacy MisM.D.   On: 05/21/2022 08:22   DG Chest Port 1 View  Result Date: 06/16/2022 CLINICAL DATA:  Shortness of breath. EXAM: PORTABLE CHEST 1 VIEW COMPARISON:  04/16/2022. FINDINGS: The heart size and mediastinal contours are within normal limits. There is atherosclerotic calcification of the aorta. Interstitial prominence is noted bilaterally. There are  small bilateral pleural effusions with atelectasis at the lung bases. No pneumothorax is seen. IMPRESSION: Small bilateral pleural effusions with atelectasis at the lung bases. Electronically Signed   By: Brett Fairy M.D.   On: 05/25/2022 04:32     Assessment and Plan:   Sinus tachycardia  Chronic LBBB - EKG was obtained after patient given IVF and metoprolol bolus , SR with LBBB - Telemetry showed sinus tachycardia, SVT  with aberrancy  - sepsis and hypokalemia mediated, consider treat underlying infection, will switch coreg to metoprolol 68m BID, start IVF at 1053mhr for hydration, and replete electrolyte imbalance  - she denied any chest discomfort at this time   Chronic systolic and diastolic heart failure NICM - 2009 LHC no CAD - Echo from 04/06/2021 with LVEF 30-35% with apical/inferior akinesis - she wishes no invasive or aggressive cardiac care/workup in the past - clinically appears severely dehydrated  - GDMT: switch coreg to metoprolol, OK to hold torsemide and spironolactone until sepsis resolves, if renal stable, may resume losartan as well   Paroxysmal A. Fib - reported in history, no recurrence, not on anticoagulation due to fall risk   Sepsis  Catheter associated UTI  Hypokalemia  Hypoalbuminemia  CKD III Type 2 DM Anemia  GERD - per primary team     Risk Assessment/Risk Scores:   New York Heart Association (NYHA) Functional Class NYHA Class I  CHA2DS2-VASc Score = 6  This indicates a 9.7% annual risk of stroke. The patient's score is based upon: CHF History: 1 HTN History: 1 Diabetes History: 1 Stroke History: 0 Vascular Disease History: 0 Age Score: 2 Gender Score: 1     For questions or updates, please contact [facility] consult [email or web address] for contact info under    Signed, XiMargie BilletNP  05/18/2022 4:02 PM  Personally seen and examined. Agree with above.  [age]ear old here with dehydration urinary tract infection.  She states that she feels dry.  Hypokalemia noted possibly secondary to diarrhea.  1.5 L has been administered.  We will continue with further liter over the next 10 hours.  Telemetry and EKGs personally reviewed, appears to be sinus tachycardia.  Has underlying cardiomyopathy.  We will give her also gentle metoprolol 25 twice daily instead of carvedilol which she has taken at home.  Spoke to her.  She is alert.  Tachycardic regular  rhythm.  Told me about her bowels clearing out.  If hypoxia occurs, stop fluids.  Underlying left bundle branch block chronic.  Prior EF 30 to 35%. Repeat potassium, 2.9.  BNP was 72.  MaCandee FurbishMD

## 2022-06-05 NOTE — H&P (Signed)
History and Physical    Patient: [name] IDH:[id number] DOB: 1923/10/17 DOA: 06/14/2022 DOS: the patient was seen and examined on 05/21/2022 PCP: Mayra Neer, MD  Patient coming from: SNF - [facility]; NOK: Niece, Charlena Cross, [phone]   Chief Complaint: CP/SOB  HPI: [name] is a 88 y.o. female with medical history significant of afib; DM; stage 3 CKD; chronic systolic CHF; HTN; and HLD presenting with CP/SOB.  She was last admitted from 5/30-6/8 for acute renal failure on stage 3 CKD and UTI vs. Asymptomatic bacteriuria.  The patient reports diarrhea.  She acknowledges urinary symptoms.  She says she just wants to die.  I spoke with her niece.  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  Her memory has been going for a couple of years.  She "talks crazy out of her head". They called her at 230AM and reported respiratory problems.  This is the 3rd time she has been up here in 5 weeks.  She is constantly accusing the family of telling her stories, someone lives down the hall and wants to marry her.  She just really wants to go to the hospital. She has been saying that she is tired and prays every night for the Lord to take her.  Her niece does just want to make her comfortable.  Her niece would like for her to be on morphine drip and [employer].    ER Course:  SOB.  ?UTI with indwelling foley. WBC 15.5, tachycardia.  CT negative.  Wide complex tachy to 160, cardiology recommended IV hydration and a small dose of beta blocker, much better.     Review of Systems: As mentioned in the history of present illness. All other systems reviewed and are negative.  Limited by dementia  Past Medical History:  Diagnosis Date   Atrial fibrillation ([place])    Cholelithiasis    Constipation    Diabetes mellitus    Dizziness    with some gait disability   GERD (gastroesophageal reflux disease)    Hepatic cyst    Hyperlipidemia    Hypertension     Ischemic cardiomyopathy    LBBB (left bundle branch block)    Macular degeneration    Other postprocedural status(V45.89)    Peripheral edema    Past Surgical History:  Procedure Laterality Date   ANKLE SURGERY Bilateral    fractures   CHOLECYSTECTOMY     Dilation and curettage     HIP ARTHROPLASTY  05/05/2012   Procedure: ARTHROPLASTY BIPOLAR HIP;  Surgeon: Mauri Pole, MD;  Location: [facility];  Service: Orthopedics;  Laterality: Left;   TONSILLECTOMY     WRIST SURGERY Bilateral    wrist fractures   Social History:  reports that she has never smoked. She has never used smokeless tobacco. She reports that she does not drink alcohol and does not use drugs.  Allergies  Allergen Reactions   Fosamax [Alendronate Sodium] Swelling   Penicillins Swelling and Other (See Comments)    Swelling, redness and warmth at injection site Has patient had a PCN reaction causing immediate rash, facial/tongue/throat swelling, SOB or lightheadedness with hypotension: No Has patient had a PCN reaction causing severe rash involving mucus membranes or skin necrosis: No Has patient had a PCN reaction that required hospitalization: No Has patient had a PCN reaction occurring within the last 10 years: No If all of the above answers are "NO", then may proceed with Cephalosporin use.  Sulfonamide Derivatives Swelling   Tetanus Toxoid Swelling    Family History  Problem Relation Age of Onset   Leukemia Mother    Arthritis Father    Heart failure Brother        died age 13   Arthritis Brother     Prior to Admission medications   Medication Sig Start Date End Date Taking? Authorizing Provider  acetaminophen (TYLENOL) 500 MG tablet Take 500 mg by mouth daily as needed for moderate pain.    [provider]  albuterol (VENTOLIN HFA) 108 (90 Base) MCG/ACT inhaler Inhale 2 puffs into the lungs every 4 (four) hours as needed for wheezing or shortness of breath.    [provider]   ALPRAZolam Duanne Moron) 1 MG tablet Take 1 tablet (1 mg total) by mouth 3 (three) times daily as needed for anxiety. 04/23/22   Darliss Cheney, MD  Alum & Mag Hydroxide-Simeth (GERI-LANTA PO) Take 30 mLs by mouth in the morning and at bedtime.    [provider]  aspirin 81 MG chewable tablet Chew 81 mg by mouth daily.    [provider]  Benzocaine-Menthol (CHLORASEPTIC SORE THROAT MT) Use as directed 2 sprays in the mouth or throat every 4 (four) hours as needed (sore throat).    [provider]  benzonatate (TESSALON) 100 MG capsule Take 100 mg by mouth 3 (three) times daily as needed for cough.    [provider]  bisacodyl (DULCOLAX) 5 MG EC tablet Take 10 mg by mouth daily as needed for moderate constipation.    [provider]  buPROPion (WELLBUTRIN XL) 150 MG 24 hr tablet Take 150 mg by mouth daily.    [provider]  carvedilol (COREG) 6.25 MG tablet Take 6.25 mg by mouth 2 (two) times daily. 09/03/17   [provider]  colestipol (COLESTID) 1 g tablet Take 2 tablets (2 g total) by mouth daily. Hold FOR CONSTIPATION 12/11/17   Kayleen Memos, DO  docusate sodium (COLACE) 100 MG capsule Take 100 mg by mouth 2 (two) times daily.    [provider]  Emollient (EUCERIN ORIGINAL HEALING) LOTN Apply 1 application. topically daily. Apply to arms,legs and back    [provider]  ferrous sulfate 325 (65 FE) MG tablet Take 1 tablet (325 mg total) by mouth daily with breakfast. 04/12/21   Nita Sells, MD  guaiFENesin (MUCINEX) 600 MG 12 hr tablet Take 600 mg by mouth 2 (two) times daily as needed for cough (congestion).    [provider]  guaifenesin (ROBITUSSIN) 100 MG/5ML syrup Take 100 mg by mouth every 8 (eight) hours as needed for cough.    [provider]  HYDROcodone-acetaminophen (NORCO/VICODIN) 5-325 MG tablet Take 1 tablet by mouth every 12 (twelve) hours as needed for moderate pain. 04/23/22    Darliss Cheney, MD  hydrOXYzine (ATARAX) 10 MG tablet Take 10 mg by mouth 3 (three) times daily as needed for itching.    [provider]  Hypertonic Nasal Wash (SINUS RINSE KIT) PACK Place 1 each into the nose every hour as needed (congestion).    [provider]  lactobacillus acidophilus & bulgar (LACTINEX) chewable tablet Chew 1 tablet by mouth daily.    [provider]  levothyroxine (SYNTHROID, LEVOTHROID) 25 MCG tablet Take 25 mcg by mouth daily before breakfast.    [provider]  loperamide (IMODIUM A-D) 2 MG tablet Take 2-4 mg by mouth See admin instructions. 4 mg after first loose  stool, then 2 mg after each loose stool    [provider]  losartan (COZAAR) 50 MG tablet Take 0.5 tablets (25 mg total) by mouth daily. 04/11/21   Nita Sells, MD  magnesium hydroxide (MILK OF MAGNESIA) 400 MG/5ML suspension Take 15 mLs by mouth daily as needed for mild constipation.    [provider]  Menthol, Topical Analgesic, (BIOFREEZE) 4 % GEL Apply 1 application topically 3 (three) times daily as needed (bilateral hip pain).    [provider]  mirabegron ER (MYRBETRIQ) 25 MG TB24 tablet Take 25 mg by mouth daily.    [provider]  mirtazapine (REMERON) 30 MG tablet Take 1 tablet (30 mg total) by mouth at bedtime. 04/11/21   Nita Sells, MD  Multiple Vitamins-Minerals (PRESERVISION AREDS PO) Take 1 capsule by mouth daily.    [provider]  mupirocin ointment (BACTROBAN) 2 % Apply 1 application. topically daily.    [provider]  NON FORMULARY Take 2.5 mLs by mouth 3 (three) times daily as needed (sore throat). Salt and water solution    [provider]  ondansetron (ZOFRAN) 4 MG tablet Take 4 mg by mouth daily as needed for nausea or vomiting.    [provider]  pantoprazole (PROTONIX) 40 MG tablet Take 1 tablet (40 mg total) by mouth 2 (two) times daily. 04/11/21   Nita Sells, MD  polyethylene glycol powder (GLYCOLAX/MIRALAX) 17 GM/SCOOP powder Take 17 g by mouth daily.    [provider]  Powders (GOLD BOND BABY POWDER EX) Apply 1 application. topically 2 (two) times daily as needed (itching).    [provider]  Pramoxine-Camphor-Zinc Acetate (ANTI ITCH EX) Apply 1 application topically 3 (three) times daily as needed (itching).    [provider]  sertraline (ZOLOFT) 100 MG tablet Take 1 tablet (100 mg total) by mouth daily. Patient taking differently: Take 200 mg by mouth daily. 04/11/21   Nita Sells, MD  simvastatin (ZOCOR) 40 MG tablet Take 40 mg by mouth at bedtime.    [provider]  spironolactone (ALDACTONE) 25 MG tablet Take 0.5 tablets (12.5 mg total) by mouth daily. 02/21/17   Josue Hector, MD  torsemide (DEMADEX) 10 MG tablet Take 1 tablet (10 mg total) by mouth daily. 04/12/21   Nita Sells, MD  torsemide (DEMADEX) 20 MG tablet Take 20 mg by mouth daily as needed (leg swelling).    [provider]  triamcinolone cream (KENALOG) 0.1 % 1 application 2 (two) times daily as needed (itching). perineum    [provider]  Zinc Oxide (DESITIN) 13 % CREA Apply 1 application topically See admin instructions. Cleanse groin area under breast with soap and water, pat dry and apply ointment on area as needed for redness.    [provider]    Physical Exam: Vitals:   05/24/2022 1615 06/04/2022 1630 05/22/2022 1711 06/17/2022 1722  BP: 100/63 (!) 124/58  111/69  Pulse: (!) 122 (!) 126  (!) 131  Resp: (!) 27 (!) 24  (!) 28  Temp:   98 F (36.7 C)   TempSrc:   Oral   SpO2: 100% 96%  96%   General:  Appears mildly confused, uncomfortable Eyes:   EOMI, normal lids, iris ENT:  grossly normal hearing, lips & tongue, mildly dry mm Neck:  no LAD, masses or thyromegaly Cardiovascular:  RR with tachycardia, no m/r/g. No LE edema.  Respiratory:   CTA bilaterally with no  wheezes/rales/rhonchi.  Normal  respiratory effort. Abdomen:  soft, mildly diffusely TTP, ND Skin:  no rash or induration seen on limited exam Musculoskeletal:  grossly normal tone BUE/BLE, good ROM, no bony abnormality Psychiatric:  mildly confused mood and affect, speech fluent and appropriate, AOx2 Neurologic:  CN 2-12 grossly intact, moves all extremities in coordinated fashion   Radiological Exams on Admission: Independently reviewed - see discussion in A/P where applicable  CT ABDOMEN PELVIS WO CONTRAST  Result Date: 06/10/2022 CLINICAL DATA:  Acute non localized abdominal pain. EXAM: CT ABDOMEN AND PELVIS WITHOUT CONTRAST TECHNIQUE: Multidetector CT imaging of the abdomen and pelvis was performed following the standard protocol without IV contrast. RADIATION DOSE REDUCTION: This exam was performed according to the departmental dose-optimization program which includes automated exposure control, adjustment of the mA and/or kV according to patient size and/or use of iterative reconstruction technique. COMPARISON:  AP chest 06/06/2022, CT abdomen and pelvis without contrast 04/17/2022 and 04/06/2021 FINDINGS: Lower chest: Small bilateral pleural effusions are new from 04/17/2022 but unchanged from CT of the chest earlier today. Mild curvilinear peripheral subpleural interlobular septal thickening/scarring is similar to prior. Coronary artery calcifications are noted. Lack of intravenous contrast limits evaluation of the solid abdominal and pelvic organ parenchyma. Hepatobiliary: The liver demonstrates smooth contours. No gross parenchymal abnormality. Status post cholecystectomy. Pancreas: Moderate atrophy and fatty infiltration of the pancreatic head greater than body and tail. A 15 mm pancreatic head fluid attenuation and 15 mm (axial series 2, image 27) and 4 mm (axial image 27) pancreatic tail fluid attenuation cysts are not significantly changed from multiple prior CTs including 04/06/2021  measured in a similar manner. No ductal dilatation is seen. Spleen: Normal in size without focal abnormality. Adrenals/Urinary Tract: Normal bilateral adrenals. Multilevel right kidney fluid attenuation cysts measuring up to 5.4 cm anteriorly and 5.0 cm posteriorly are not significantly changed. There is mild residual contrast within the kidney parenchyma and early contrast excretion into the renal calices bilaterally from contrast given for same day CT chest. No hydronephrosis. Streak artifact from bilateral total hip arthroplasty hardware obscures the urinary bladder. There appears to be a urinary catheter extending into the urinary bladder. Stomach/Bowel: Mild-to-moderate sigmoid and mild descending colon diverticulosis. Within the limitations of the streak artifact within the pelvis, no definite inflammatory changes are seen to indicate acute diverticulitis. Underdistention of the majority of the colon further limits evaluation of the large bowel the appendix is again not confidently identified. No dilated loops of bowel are seen to indicate bowel obstruction. Vascular/Lymphatic: No abdominal aortic aneurysm. Moderate to high-grade atherosclerotic calcifications. Reproductive: Streak artifact limits evaluation. Other: No ventral abdominal wall hernia. No free air or free fluid is seen within the abdomen or pelvis. Musculoskeletal: Mild multilevel degenerative disc space narrowing and endplate osteophytes throughout the visualized thoracolumbar spine. Status post bilateral total hip arthroplasty with associated streak artifact. IMPRESSION: 1. Small bilateral pleural effusions. 2. Coronary artery calcifications indicating coronary artery disease. 3. Mild-to-moderate sigmoid and mild descending colon diverticulosis. No definite inflammatory changes to indicate acute diverticulitis. 4. Stable cystic lesions within the pancreas measuring up to 15 mm. Aortic Atherosclerosis (ICD10-I70.0). Electronically Signed   By:  Yvonne Kendall M.D.   On: 06/12/2022 12:48   CT Angio Chest Pulmonary Embolism (PE) W or WO Contrast  Result Date: 06/02/2022 CLINICAL DATA:  Pulmonary embolism (PE) suspected, high prob EXAM: CT ANGIOGRAPHY CHEST WITH CONTRAST TECHNIQUE: Multidetector CT imaging of the chest was performed using the standard protocol during bolus administration of intravenous contrast. Multiplanar CT  image reconstructions and MIPs were obtained to evaluate the vascular anatomy. RADIATION DOSE REDUCTION: This exam was performed according to the departmental dose-optimization program which includes automated exposure control, adjustment of the mA and/or kV according to patient size and/or use of iterative reconstruction technique. CONTRAST:  5m OMNIPAQUE IOHEXOL 350 MG/ML SOLN COMPARISON:  2019 FINDINGS: Cardiovascular: Satisfactory opacification of the pulmonary arteries to the segmental level. No evidence of pulmonary embolism. Normal heart size. No pericardial effusion. Coronary artery calcification. Mediastinum/Nodes: Normal thyroid.  No enlarged nodes. Lungs/Pleura: Trace pleural effusions. Dependent atelectatic changes. Similar to slight increase in size of 5 mm left upper lobe nodule (series 8, image 65). Upper Abdomen: No acute abnormality. Musculoskeletal: No acute osseous abnormality. Review of the MIP images confirms the above findings. IMPRESSION: No acute pulmonary embolism. Trace pleural effusions.  Dependent atelectasis. Electronically Signed   By: PMacy MisM.D.   On: 06/08/2022 08:22   DG Chest Port 1 View  Result Date: 05/26/2022 CLINICAL DATA:  Shortness of breath. EXAM: PORTABLE CHEST 1 VIEW COMPARISON:  04/16/2022. FINDINGS: The heart size and mediastinal contours are within normal limits. There is atherosclerotic calcification of the aorta. Interstitial prominence is noted bilaterally. There are small bilateral pleural effusions with atelectasis at the lung bases. No pneumothorax is seen.  IMPRESSION: Small bilateral pleural effusions with atelectasis at the lung bases. Electronically Signed   By: LBrett FairyM.D.   On: 05/27/2022 04:32    EKG: Independently reviewed.  NSR with rate 96; LBBB with NSCSLT    Labs on Admission: I have personally reviewed the available labs and imaging studies at the time of the admission.  Pertinent labs:    K+ 2.9 Glucose 169 BUN 20/Creatinine 1.28/GFR 38 - improved Albumin 2.4 BNP 72.2 HS troponin 12, 13 WBC 15.5 Hgb 11.3 Platelets 143 COVID/flu negative UA: moderate Hgb, small LE, + nitrites, many bacteria   Assessment and Plan: Principal Problem:   Complicated UTI (urinary tract infection) Active Problems:   End of life care    End of life care -Patient presenting with n/v/d -Appears to have complicated UTI (has indwelling foley) +/- sepsis also with tachycardia -Cardiology has consulted -After discussion with family and patient, they prefer comfort measures only -[facility] -[facility] may be a [facility] other [facility], as she does not appear to be actively dying at this time -However, her reserve is likely quite low given her age and overall frailty -Comfort care order set utilized -No antibiotics or IVF as per family's request -Pain control with morphine drip        Advance Care Planning:   Code Status: DNR   Consults: Palliative care; cardiology  DVT Prophylaxis: None  Family Communication: None present; I spoke with her niece by telephone at the time of admission  Severity of Illness: The appropriate patient status for this patient is INPATIENT. Inpatient status is judged to be reasonable and necessary in order to provide the required intensity of service to ensure the patient's safety. The patient's presenting symptoms, physical exam findings, and initial radiographic and laboratory data in the context of their  chronic comorbidities is felt to place them at high risk for further clinical deterioration. Furthermore, it is not anticipated that the patient will be medically stable for discharge from the hospital within 2 midnights of admission.   * I certify that at the point of admission it is my clinical  judgment that the patient will require inpatient hospital care spanning beyond 2 midnights from the point of admission due to high intensity of service, high risk for further deterioration and high frequency of surveillance required.*  Author: Karmen Bongo, MD 05/30/2022 5:35 PM  For on call review [email or web address].

## 2022-06-05 NOTE — ED Provider Notes (Signed)
[facility] EMERGENCY DEPARTMENT Provider Note   CSN: [id number] Arrival date & time: 05/18/2022  0356     History  Chief Complaint  Patient presents with   Chest Pain   Shortness of Breath    [name] is a 88 y.o. female.  Presents to the emergency department for evaluation of chest pain and shortness of breath.  Patient alerted staff around 2:15 AM that she was having pain and difficulty breathing.  She reports that it has been going on for some time prior to that, causing her not to be able to sleep tonight.  Patient was given a breathing treatment, Xanax and aspirin by staff and has had improvement of the pain.  She is still short of breath.       Home Medications Prior to Admission medications   Medication Sig Start Date End Date Taking? Authorizing Provider  acetaminophen (TYLENOL) 500 MG tablet Take 500 mg by mouth daily as needed for moderate pain.   Yes [provider]  albuterol (VENTOLIN HFA) 108 (90 Base) MCG/ACT inhaler Inhale 2 puffs into the lungs every 4 (four) hours as needed for wheezing or shortness of breath.   Yes [provider]  ALPRAZolam Duanne Moron) 1 MG tablet Take 1 tablet (1 mg total) by mouth 3 (three) times daily as needed for anxiety. Patient taking differently: Take 1 mg by mouth daily. 04/23/22  Yes Pahwani, Einar Grad, MD  Alum & Mag Hydroxide-Simeth (GERI-LANTA PO) Take 30 mLs by mouth in the morning and at bedtime.   Yes [provider]  aspirin 81 MG chewable tablet Chew 81 mg by mouth daily.   Yes [provider]  benzonatate (TESSALON) 100 MG capsule Take 100 mg by mouth 3 (three) times daily as needed for cough.   Yes [provider]  bisacodyl (DULCOLAX) 5 MG EC tablet Take 10 mg by mouth daily as needed for moderate constipation.   Yes [provider]  buPROPion (WELLBUTRIN XL) 150 MG 24 hr tablet Take 150 mg by mouth daily.   Yes [provider]  carvedilol  (COREG) 6.25 MG tablet Take 6.25 mg by mouth 2 (two) times daily. 09/03/17  Yes [provider]  CHLORASEPTIC 1.4 % LIQD Use as directed 2 sprays in the mouth or throat every 4 (four) hours as needed for sore throat. 05/22/22  Yes [provider]  colestipol (COLESTID) 1 g tablet Take 2 tablets (2 g total) by mouth daily. Hold FOR CONSTIPATION 12/11/17  Yes Irene Pap N, DO  docusate sodium (COLACE) 100 MG capsule Take 100 mg by mouth 2 (two) times daily.   Yes [provider]  Emollient (EUCERIN ORIGINAL HEALING) LOTN Apply 1 application. topically daily. Apply to arms,legs and back   Yes [provider]  ferrous sulfate 325 (65 FE) MG tablet Take 1 tablet (325 mg total) by mouth daily with breakfast. 04/12/21  Yes Nita Sells, MD  guaiFENesin (MUCINEX) 600 MG 12 hr tablet Take 600 mg by mouth 2 (two) times daily as needed for cough (congestion).   Yes [provider]  guaifenesin (ROBITUSSIN) 100 MG/5ML syrup Take 100 mg by mouth every 8 (eight) hours as needed for cough.   Yes [provider]  HYDROcodone-acetaminophen (NORCO/VICODIN) 5-325 MG tablet Take 1 tablet by mouth every 12 (twelve) hours as needed for moderate pain. 04/23/22  Yes Darliss Cheney, MD  hydrOXYzine (ATARAX) 10 MG tablet Take 10 mg by mouth 3 (three) times daily as  needed for itching.   Yes [provider]  Hypertonic Nasal Wash (SINUS RINSE KIT) PACK Place 1 each into the nose every hour as needed (congestion).   Yes [provider]  Infant Care [employer] [facility]) OINT Apply 1 application  topically See admin instructions. Apply to buttocks after incontinent episode and as needed. 04/26/22  Yes [provider]  lactobacillus acidophilus & bulgar (LACTINEX) chewable tablet Chew 1 tablet by mouth daily.   Yes [provider]  levothyroxine (SYNTHROID, LEVOTHROID) 25 MCG tablet Take 25 mcg by mouth daily before breakfast.   Yes [provider]  loperamide (IMODIUM A-D) 2 MG tablet Take 2 mg by mouth See admin instructions. Take 2 tablets (12m) by mouth after first loose stool as needed once a day.   Yes [provider]  losartan (COZAAR) 50 MG tablet Take 0.5 tablets (25 mg total) by mouth daily. 04/11/21  Yes SNita Sells MD  magnesium hydroxide (MILK OF MAGNESIA) 400 MG/5ML suspension Take 15 mLs by mouth daily as needed for mild constipation.   Yes [provider]  Menthol, Topical Analgesic, (BIOFREEZE) 4 % GEL Apply 1 application topically 3 (three) times daily as needed (bilateral hip pain).   Yes [provider]  mirabegron ER (MYRBETRIQ) 25 MG TB24 tablet Take 25 mg by mouth daily.   Yes [provider]  mirtazapine (REMERON) 30 MG tablet Take 1 tablet (30 mg total) by mouth at bedtime. 04/11/21  Yes SNita Sells MD  Multiple Vitamins-Minerals (PRESERVISION AREDS PO) Take 1 capsule by mouth daily.   Yes [provider]  mupirocin ointment (BACTROBAN) 2 % Apply 1 application. topically daily.   Yes [provider]  nitrofurantoin, macrocrystal-monohydrate, (MACROBID) 100 MG capsule Take 100 mg by mouth 2 (two) times daily. 06/01/22 06/06/22 Yes [provider]  ondansetron (ZOFRAN) 4 MG tablet Take 4 mg by mouth daily as needed for nausea or vomiting.   Yes [provider]  pantoprazole (PROTONIX) 40 MG tablet Take 1 tablet (40 mg total) by mouth 2 (two) times daily. 04/11/21  Yes SNita Sells MD  polyethylene glycol powder (GLYCOLAX/MIRALAX) 17 GM/SCOOP powder Take 17 g by mouth daily.   Yes [provider]  Powders (GOLD BOND BABY POWDER EX) Apply 1 application. topically 2 (two) times daily as needed (itching).   Yes [provider]  Pramoxine-Camphor-Zinc Acetate (ANTI ITCH EX) Apply 1 application topically 3 (three) times daily as needed (itching).   Yes [provider]  senna (SENOKOT) 8.6 MG  TABS tablet Take 2 tablets by mouth at bedtime. 05/22/22  Yes [provider]  sertraline (ZOLOFT) 100 MG tablet Take 1 tablet (100 mg total) by mouth daily. 04/11/21  Yes SNita Sells MD  simvastatin (ZOCOR) 40 MG tablet Take 40 mg by mouth at bedtime.   Yes [provider]  Sodium Phosphates (ENEMA) 7-19 GM/118ML ENEM Place 1 Application rectally daily as needed (constipation). 05/08/22  Yes [provider]  spironolactone (ALDACTONE) 25 MG tablet Take 0.5 tablets (12.5 mg total) by mouth daily. 02/21/17  Yes NJosue Hector MD  tamsulosin (FLOMAX) 0.4 MG CAPS capsule Take 0.4 mg by mouth daily. 05/27/22  Yes [provider]  torsemide (DEMADEX) 10 MG tablet Take 1 tablet (10 mg total) by mouth daily. 04/12/21  Yes SNita Sells MD  torsemide (DEMADEX) 20 MG tablet Take 20 mg by mouth daily as needed (leg swelling).   Yes [provider]  triamcinolone cream (KENALOG) 0.1 %  1 application 2 (two) times daily as needed (itching). perineum   Yes [provider]  Vitamin D, Ergocalciferol, 50 MCG (2000 UT) CAPS Take 2,000 Units by mouth every 30 (thirty) days. 05/29/22  Yes [provider]  Zinc Oxide (DESITIN) 13 % CREA Apply 1 application topically See admin instructions. Cleanse groin area under breast with soap and water, pat dry and apply ointment on area as needed for redness.   Yes [provider]      Allergies    Fosamax [alendronate sodium], Penicillins, Sulfonamide derivatives, and Tetanus toxoid    Review of Systems   Review of Systems  Physical Exam Updated Vital Signs BP (!) 89/59   Pulse (!) 124   Temp 99.5 F (37.5 C) (Axillary)   Resp (!) 24   SpO2 93%  Physical Exam Vitals and nursing note reviewed.  Constitutional:      General: She is not in acute distress.    Appearance: She is well-developed.  HENT:     Head: Normocephalic and atraumatic.     Mouth/Throat:     Mouth: Mucous  membranes are moist.  Eyes:     General: Vision grossly intact. Gaze aligned appropriately.     Extraocular Movements: Extraocular movements intact.     Conjunctiva/sclera: Conjunctivae normal.  Cardiovascular:     Rate and Rhythm: Normal rate and regular rhythm.     Pulses: Normal pulses.     Heart sounds: Normal heart sounds, S1 normal and S2 normal. No murmur heard.    No friction rub. No gallop.  Pulmonary:     Effort: Pulmonary effort is normal. No respiratory distress.     Breath sounds: Normal breath sounds.  Abdominal:     General: Bowel sounds are normal.     Palpations: Abdomen is soft.     Tenderness: There is no abdominal tenderness. There is no guarding or rebound.     Hernia: No hernia is present.  Musculoskeletal:        General: No swelling.     Cervical back: Full passive range of motion without pain, normal range of motion and neck supple. No spinous process tenderness or muscular tenderness. Normal range of motion.     Right lower leg: Edema present.     Left lower leg: Edema present.  Skin:    General: Skin is warm and dry.     Capillary Refill: Capillary refill takes less than 2 seconds.     Findings: No ecchymosis, erythema, rash or wound.  Neurological:     General: No focal deficit present.     Mental Status: She is alert and oriented to person, place, and time.     GCS: GCS eye subscore is 4. GCS verbal subscore is 5. GCS motor subscore is 6.     Cranial Nerves: Cranial nerves 2-12 are intact.     Sensory: Sensation is intact.     Motor: Motor function is intact.     Coordination: Coordination is intact.  Psychiatric:        Attention and Perception: Attention normal.        Mood and Affect: Mood normal.        Speech: Speech normal.        Behavior: Behavior normal.     ED Results / Procedures / Treatments   Labs (all labs ordered are listed, but only abnormal results are displayed) Labs Reviewed  CBC WITH DIFFERENTIAL/PLATELET - Abnormal;  Notable for the following components:  Result Value   WBC 15.5 (*)    RBC 3.83 (*)    Hemoglobin 11.3 (*)    HCT 33.5 (*)    Platelets 143 (*)    Neutro Abs 13.9 (*)    Lymphs Abs 0.5 (*)    Eosinophils Absolute 0.7 (*)    Abs Immature Granulocytes 0.12 (*)    All other components within normal limits  COMPREHENSIVE METABOLIC PANEL - Abnormal; Notable for the following components:   Potassium 2.9 (*)    Chloride 97 (*)    Glucose, Bld 169 (*)    Creatinine, Ser 1.28 (*)    Calcium 8.6 (*)    Total Protein 6.0 (*)    Albumin 2.4 (*)    GFR, Estimated 38 (*)    All other components within normal limits  URINALYSIS, ROUTINE W REFLEX MICROSCOPIC - Abnormal; Notable for the following components:   APPearance HAZY (*)    Hgb urine dipstick MODERATE (*)    Nitrite POSITIVE (*)    Leukocytes,Ua SMALL (*)    Bacteria, UA MANY (*)    All other components within normal limits  RESP PANEL BY RT-PCR (FLU A&B, COVID) ARPGX2  URINE CULTURE  BRAIN NATRIURETIC PEPTIDE  TROPONIN I (HIGH SENSITIVITY)  TROPONIN I (HIGH SENSITIVITY)    EKG EKG Interpretation  Date/Time:  Wednesday June 05 2022 04:00:02 EDT Ventricular Rate:  96 PR Interval:  168 QRS Duration: 159 QT Interval:  391 QTC Calculation: 495 R Axis:   -3 Text Interpretation: Sinus or ectopic atrial rhythm Left bundle branch block No significant change since last tracing Confirmed by Orpah Greek [phone]) on 05/31/2022 4:13:06 AM  Radiology CT ABDOMEN PELVIS WO CONTRAST  Result Date: 05/25/2022 CLINICAL DATA:  Acute non localized abdominal pain. EXAM: CT ABDOMEN AND PELVIS WITHOUT CONTRAST TECHNIQUE: Multidetector CT imaging of the abdomen and pelvis was performed following the standard protocol without IV contrast. RADIATION DOSE REDUCTION: This exam was performed according to the departmental dose-optimization program which includes automated exposure control, adjustment of the mA and/or kV according to patient  size and/or use of iterative reconstruction technique. COMPARISON:  AP chest 06/14/2022, CT abdomen and pelvis without contrast 04/17/2022 and 04/06/2021 FINDINGS: Lower chest: Small bilateral pleural effusions are new from 04/17/2022 but unchanged from CT of the chest earlier today. Mild curvilinear peripheral subpleural interlobular septal thickening/scarring is similar to prior. Coronary artery calcifications are noted. Lack of intravenous contrast limits evaluation of the solid abdominal and pelvic organ parenchyma. Hepatobiliary: The liver demonstrates smooth contours. No gross parenchymal abnormality. Status post cholecystectomy. Pancreas: Moderate atrophy and fatty infiltration of the pancreatic head greater than body and tail. A 15 mm pancreatic head fluid attenuation and 15 mm (axial series 2, image 27) and 4 mm (axial image 27) pancreatic tail fluid attenuation cysts are not significantly changed from multiple prior CTs including 04/06/2021 measured in a similar manner. No ductal dilatation is seen. Spleen: Normal in size without focal abnormality. Adrenals/Urinary Tract: Normal bilateral adrenals. Multilevel right kidney fluid attenuation cysts measuring up to 5.4 cm anteriorly and 5.0 cm posteriorly are not significantly changed. There is mild residual contrast within the kidney parenchyma and early contrast excretion into the renal calices bilaterally from contrast given for same day CT chest. No hydronephrosis. Streak artifact from bilateral total hip arthroplasty hardware obscures the urinary bladder. There appears to be a urinary catheter extending into the urinary bladder. Stomach/Bowel: Mild-to-moderate sigmoid and mild descending colon diverticulosis. Within the limitations of the streak  artifact within the pelvis, no definite inflammatory changes are seen to indicate acute diverticulitis. Underdistention of the majority of the colon further limits evaluation of the large bowel the appendix is  again not confidently identified. No dilated loops of bowel are seen to indicate bowel obstruction. Vascular/Lymphatic: No abdominal aortic aneurysm. Moderate to high-grade atherosclerotic calcifications. Reproductive: Streak artifact limits evaluation. Other: No ventral abdominal wall hernia. No free air or free fluid is seen within the abdomen or pelvis. Musculoskeletal: Mild multilevel degenerative disc space narrowing and endplate osteophytes throughout the visualized thoracolumbar spine. Status post bilateral total hip arthroplasty with associated streak artifact. IMPRESSION: 1. Small bilateral pleural effusions. 2. Coronary artery calcifications indicating coronary artery disease. 3. Mild-to-moderate sigmoid and mild descending colon diverticulosis. No definite inflammatory changes to indicate acute diverticulitis. 4. Stable cystic lesions within the pancreas measuring up to 15 mm. Aortic Atherosclerosis (ICD10-I70.0). Electronically Signed   By: Yvonne Kendall M.D.   On: 05/20/2022 12:48   CT Angio Chest Pulmonary Embolism (PE) W or WO Contrast  Result Date: 05/30/2022 CLINICAL DATA:  Pulmonary embolism (PE) suspected, high prob EXAM: CT ANGIOGRAPHY CHEST WITH CONTRAST TECHNIQUE: Multidetector CT imaging of the chest was performed using the standard protocol during bolus administration of intravenous contrast. Multiplanar CT image reconstructions and MIPs were obtained to evaluate the vascular anatomy. RADIATION DOSE REDUCTION: This exam was performed according to the departmental dose-optimization program which includes automated exposure control, adjustment of the mA and/or kV according to patient size and/or use of iterative reconstruction technique. CONTRAST:  17m OMNIPAQUE IOHEXOL 350 MG/ML SOLN COMPARISON:  2019 FINDINGS: Cardiovascular: Satisfactory opacification of the pulmonary arteries to the segmental level. No evidence of pulmonary embolism. Normal heart size. No pericardial effusion. Coronary  artery calcification. Mediastinum/Nodes: Normal thyroid.  No enlarged nodes. Lungs/Pleura: Trace pleural effusions. Dependent atelectatic changes. Similar to slight increase in size of 5 mm left upper lobe nodule (series 8, image 65). Upper Abdomen: No acute abnormality. Musculoskeletal: No acute osseous abnormality. Review of the MIP images confirms the above findings. IMPRESSION: No acute pulmonary embolism. Trace pleural effusions.  Dependent atelectasis. Electronically Signed   By: PMacy MisM.D.   On: 06/09/2022 08:22   DG Chest Port 1 View  Result Date: 06/11/2022 CLINICAL DATA:  Shortness of breath. EXAM: PORTABLE CHEST 1 VIEW COMPARISON:  04/16/2022. FINDINGS: The heart size and mediastinal contours are within normal limits. There is atherosclerotic calcification of the aorta. Interstitial prominence is noted bilaterally. There are small bilateral pleural effusions with atelectasis at the lung bases. No pneumothorax is seen. IMPRESSION: Small bilateral pleural effusions with atelectasis at the lung bases. Electronically Signed   By: LBrett FairyM.D.   On: 06/16/2022 04:32    Procedures Procedures    Medications Ordered in ED Medications  ondansetron (ZOFRAN) injection 4 mg (4 mg Intravenous Given 06/06/2022 1724)  acetaminophen (TYLENOL) tablet 650 mg (has no administration in time range)    Or  acetaminophen (TYLENOL) suppository 650 mg (has no administration in time range)  morphine bolus via infusion 2 mg (has no administration in time range)  morphine 107min NS 10019m1mg27m) infusion - premix (5 mg/hr Intravenous Rate/Dose Verify 06/06/22 0421)  LORazepam (ATIVAN) tablet 1 mg ( Oral See Alternative 05/22/2022 1803)    Or  LORazepam (ATIVAN) 2 MG/ML concentrated solution 1 mg ( Sublingual See Alternative 05/27/2022 1803)    Or  LORazepam (ATIVAN) injection 1 mg (1 mg Intravenous Given 05/26/2022 1803)  haloperidol (HALDOL) tablet 0.5 mg (  has no administration in time range)    Or   haloperidol (HALDOL) 2 MG/ML solution 0.5 mg (has no administration in time range)    Or  haloperidol lactate (HALDOL) injection 0.5 mg (has no administration in time range)  diphenhydrAMINE (BENADRYL) injection 12.5 mg (has no administration in time range)  ondansetron (ZOFRAN-ODT) disintegrating tablet 4 mg (has no administration in time range)    Or  ondansetron (ZOFRAN) injection 4 mg (has no administration in time range)  glycopyrrolate (ROBINUL) tablet 1 mg (has no administration in time range)    Or  glycopyrrolate (ROBINUL) injection 0.2 mg (has no administration in time range)    Or  glycopyrrolate (ROBINUL) injection 0.2 mg (has no administration in time range)  antiseptic oral rinse (BIOTENE) solution 15 mL (has no administration in time range)  polyvinyl alcohol (LIQUIFILM TEARS) 1.4 % ophthalmic solution 1 drop (has no administration in time range)  potassium chloride SA (KLOR-CON M) CR tablet 40 mEq (40 mEq Oral Given 06/04/2022 0531)  iohexol (OMNIPAQUE) 350 MG/ML injection 50 mL (50 mLs Intravenous Contrast Given 05/30/2022 0758)  ondansetron (ZOFRAN) injection 4 mg (4 mg Intravenous Given 06/08/2022 1001)  sodium chloride 0.9 % bolus 500 mL (0 mLs Intravenous Stopped 06/10/2022 1126)  ipratropium-albuterol (DUONEB) 0.5-2.5 (3) MG/3ML nebulizer solution 3 mL (3 mLs Nebulization Given 05/22/2022 1031)  cefTRIAXone (ROCEPHIN) 1 g in sodium chloride 0.9 % 100 mL IVPB (0 g Intravenous Stopped 05/30/2022 1401)  sodium chloride 0.9 % bolus 1,000 mL (0 mLs Intravenous Stopped 06/08/2022 1707)  famotidine (PEPCID) IVPB 20 mg premix (0 mg Intravenous Stopped 05/31/2022 1401)  metoprolol tartrate (LOPRESSOR) injection 2.5 mg (2.5 mg Intravenous Given 05/31/2022 1408)  potassium chloride SA (KLOR-CON M) CR tablet 40 mEq (40 mEq Oral Given 05/31/2022 1707)    ED Course/ Medical Decision Making/ A&P Clinical Course as of 06/06/22 0716  Wed Jun 05, 2022  1028 Patient with nausea, gagging.  Minimal emesis but  appears more so sputum or phlegm.  She has mild epigastric discomfort on palpation.  We will give patient antiemetic, nebulized breathing treatment to include loosen some of her phlegm.  Given the abdominal pain in the nausea will obtain a CT abdomen.  She has leukocytosis, she has indwelling catheter and she mentioned that she had some dysuria, thinks she might of been treated for UTI but she is not sure.  Will obtain urinalysis. [SG]  7680 Patient heart rate 160s 170s [SG]  1332 Heart rate improved IV fluids; patient is septic, continue IV fluid resuscitation [SG]  1333 Spoke with Dr. Marlou Porch cardiology, discussed management.  Recommend IV fluid resuscitation, small dose of metoprolol.  If not improved then small dose of amiodarone would be reasonable. They will see her in consult. [SG]  1426 HR improved after IVF and 2.5 mL of lopressor. Will plan admit to urosepsis; continue IVF [SG]    Clinical Course User Index [SG] Jeanell Sparrow, DO                           Medical Decision Making Amount and/or Complexity of Data Reviewed Labs: ordered. Radiology: ordered.  Risk Prescription drug management. Decision regarding hospitalization.   Presented to the emergency department with shortness of breath, chest pain.  Patient given Xanax, breathing treatment and aspirin by nursing home staff.  They reported improvement, however patient states that she is still having pain at arrival.  Initial work-up has been performed, awaiting further  data.  Signout oncoming ER physician to follow-up results.        Final Clinical Impression(s) / ED Diagnoses Final diagnoses:  Urinary tract infection with hematuria, site unspecified  Sepsis, due to unspecified organism, unspecified whether acute organ dysfunction present [facility])  Hypokalemia    Rx / DC Orders ED Discharge Orders     None         Orpah Greek, MD 06/06/22 [phone]

## 2022-06-05 NOTE — ED Notes (Signed)
Pt is vomiting at this time. MD notified for orders.

## 2022-06-05 NOTE — ED Notes (Signed)
Phlebotomy [facility] requested for collection of delta trop

## 2022-06-05 NOTE — ED Triage Notes (Signed)
Pt BIB [facility] from [facility]F. Pt states this morning around 2:15 am she called out CP, stating she felt like she was going to die and c/o SOB. SNF RN administered albuterol inhaler, Xanax, and tylenol. On arrival pt states pain was ongoing before she went to sleep. Verbalized complete resolution of CP with ongoing SOB. C/o pain in between her shoulders. EMS arrival noted wheezing X1 duoneb and 324 ASA. Per note pt is being treated for UTI.

## 2022-06-05 NOTE — ED Notes (Signed)
Pt is currently asleep after 1mg  Ativan given. Will start Morphine drip once pt wakes up and becomes agitated again. Will continue to monitor.

## 2022-06-05 NOTE — ED Notes (Signed)
Pt tachypneic and restless, complaining of lower back and leg pain.

## 2022-06-05 NOTE — ED Notes (Signed)
Noticed blanchable red area to buttocks, skin intact, while providing peri care after an episode of incontinence. Place sacral foam dressing.

## 2022-06-06 DIAGNOSIS — Z66 Do not resuscitate: Secondary | ICD-10-CM

## 2022-06-06 DIAGNOSIS — E876 Hypokalemia: Secondary | ICD-10-CM

## 2022-06-06 DIAGNOSIS — Z515 Encounter for palliative care: Secondary | ICD-10-CM

## 2022-06-06 DIAGNOSIS — Z7189 Other specified counseling: Secondary | ICD-10-CM

## 2022-06-06 DIAGNOSIS — A419 Sepsis, unspecified organism: Secondary | ICD-10-CM | POA: Diagnosis not present

## 2022-06-06 DIAGNOSIS — R638 Other symptoms and signs concerning food and fluid intake: Secondary | ICD-10-CM

## 2022-06-06 DIAGNOSIS — Z789 Other specified health status: Secondary | ICD-10-CM

## 2022-06-06 DIAGNOSIS — R5383 Other fatigue: Secondary | ICD-10-CM

## 2022-06-06 DIAGNOSIS — N390 Urinary tract infection, site not specified: Secondary | ICD-10-CM | POA: Diagnosis not present

## 2022-06-06 MED ORDER — HALOPERIDOL LACTATE 2 MG/ML PO CONC: 2.0000 mg | Freq: Four times a day (QID) | ORAL | Status: DC | PRN

## 2022-06-06 MED ORDER — LORAZEPAM 1 MG PO TABS: 1.0000 mg | ORAL_TABLET | ORAL | Status: DC | PRN

## 2022-06-06 MED ORDER — ACETAMINOPHEN 160 MG/5ML PO SOLN: 650.0000 mg | Freq: Once | ORAL | Status: DC

## 2022-06-06 MED ORDER — LORAZEPAM 2 MG/ML PO CONC: 1.0000 mg | ORAL | Status: DC | PRN

## 2022-06-06 MED ORDER — HALOPERIDOL 1 MG PO TABS: 2.0000 mg | ORAL_TABLET | Freq: Four times a day (QID) | ORAL | Status: DC | PRN

## 2022-06-06 MED ORDER — BIOTENE DRY MOUTH MT LIQD
15.0000 mL | Freq: Two times a day (BID) | OROMUCOSAL | Status: DC
  Administered 2022-06-07: 15 mL via TOPICAL

## 2022-06-06 MED ORDER — HALOPERIDOL LACTATE 5 MG/ML IJ SOLN: 2.0000 mg | Freq: Four times a day (QID) | INTRAMUSCULAR | Status: DC | PRN

## 2022-06-06 MED ORDER — ACETAMINOPHEN 160 MG/5ML PO SOLN
ORAL | Status: AC
  Filled 2022-06-06: qty 20.3

## 2022-06-06 MED ORDER — MORPHINE BOLUS VIA INFUSION
2.0000 mg | INTRAVENOUS | Status: DC | PRN
  Administered 2022-06-07: 2 mg via INTRAVENOUS

## 2022-06-06 MED ORDER — LORAZEPAM 2 MG/ML IJ SOLN
1.0000 mg | INTRAMUSCULAR | Status: DC | PRN
  Administered 2022-06-07 (×2): 1 mg via INTRAVENOUS
  Filled 2022-06-06 (×2): qty 1

## 2022-06-06 NOTE — TOC Progression Note (Deleted)
Transition of Care [facility]) - Progression Note    Patient Details  Name: [name] MRN: [id number] Date of Birth: 09/22/1923  [facility]) CM/SW Contact  Lockie Pares, RN Phone Number: 06/06/2022, 1:18 PM  Clinical Narrative:     Spoke with family in room about residential hospice. Made aware of areas.They had questions [facility]. Will follow up with liaison.    Messaged with Danford Bad and Victorino Dike of [facility].  Danford Bad came and  spoke to the patents family about hospice.  They would like [facility]. Will await a bed there.    Patient is on comfort measures and is lying in the bed resting with eyes closed and even breathing.  Barriers to Discharge: No Barriers Identified  Expected Discharge Plan and Services          [facility]                                       Social Determinants of Health (SDOH) Interventions    Readmission Risk Interventions     No data to display

## 2022-06-06 NOTE — ED Notes (Addendum)
Pt anxious, shaking and  moaning with onset of linen, gown pad change. Ativan ordered/given.

## 2022-06-06 NOTE — ED Notes (Signed)
Arousable to voice, follows commands for oral temp, febrile after recent tylenol.

## 2022-06-06 NOTE — ED Notes (Signed)
Palliative Care NP into room, at [facility]

## 2022-06-06 NOTE — Progress Notes (Signed)
    Please see consult note from yesterday for details.  No new measures today.  Will sign off. Please let us know if we can be of further assistance.  Donato Schultz, MD

## 2022-06-06 NOTE — Consult Note (Signed)
Consultation Note Date: 06/06/2022   Patient Name: [name]  DOB: [birth year]  MRN: [id number]  Age / Sex: 88 y.o., female  PCP: Mayra Neer, MD Referring Physician: Thurnell Lose, MD  Reason for Consultation: Non pain symptom management, Pain control, Psychosocial/spiritual support, and Terminal Care  HPI/Patient Profile: 88 y.o. female  with past medical history of afib, DM, stage 3 CKD, chronic systolic CHF, HTN, and HLD presented to ED on 05/31/2022 from [facility] with complaints of chest pain and shortness of breath. In ED, patient appeared to have complicated UTI d/t indwelling foley, +/- sepsis with tachycardia. After discussions with admitting provider, family opted for patient transition to full comfort measures. Patient was admitted on 05/19/2022 for end of life care.   Clinical Assessment and Goals of Care: I have reviewed medical records including EPIC notes, labs, and imaging. Received report from primary RN - no acute concerns. RN reports patient has been resting comfortably after initiation of morphine drip. Reports patient is arousable to voice, but otherwise lethargic, not eating/drinking when offered oral intake.   Went to visit patient at bedside - no family/visitors present. Patient was lying in bed asleep - I did not attempt to wake her. No signs or non-verbal gestures of pain or discomfort noted. No respiratory distress, increased work of breathing, or secretions noted. Morphine drip infusing at 75m/hr. 3L O2 [place] in place. Durable DNR form at bedside.  Met with Niece/Estelle via phone  to discuss diagnosis, prognosis, GOC, EOL wishes, disposition, and options.  I introduced Palliative Medicine as specialized medical care for people living with serious illness. It focuses on providing relief from the symptoms and stress of a serious illness. The goal is to improve quality of life for  both the patient and the family.  We discussed a brief life review of the patient as well as functional and nutritional status. Patient's husband is deceased and she had no children. Nieces and nephews are her closest relatives. Patient raised ECleone Slimsince she was 88years old and they are very close.  ECleone Slimtells me patient was admitted to [facility] 21 days ago for therapy; however, patient showed no improvement over that time and they recently transferred her to LTC side of facility. ECleone Slimtells me patient has told her she "prays every night to God to come home (heaven)" for 2.5 years.   We discussed patient's current illness and what it means in the larger context of patient's on-going co-morbidities. ECleone Slimhas a clear understanding of patient's acute medical situation. Natural disease trajectory and expectations at EOL were discussed. I attempted to elicit values and goals of care important to the patient. The difference between aggressive medical intervention and comfort care was considered in light of the patient's goals of care. ECleone Slimis clear goal is for full comfort measures. She is understandably tearful, but states "I know this is what she (patient) would want."  We talked about transition to comfort measures in house and what that would entail inclusive of medications to control pain, dyspnea, agitation, nausea, and itching. We discussed stopping all unnecessary measures such as blood draws, needle sticks, oxygen, antibiotics, CBGs/insulin, cardiac monitoring, IVF, and frequent vital signs. Estelle expressed understanding.  Advance directives were considered and discussed. ECharlena Crossis HCPOA #1 and DJulious Okais HCPOA #2.  Provided education and counseling at length on the philosophy and benefits of hospice care. Discussed that it offers a holistic approach to care in the setting of end-stage illness, and is  about supporting the patient where they are allowing  nature to take it's course. Discussed the hospice team includes RNs, physicians, social workers, and chaplains. They can provide personal care, support for the family, and help keep patient out of the hospital as well as assist with DME needs for home hospice. Education provided on the difference between home vs residential hospice. Provided reassurance that residential hospice referral could be cancelled (would anticipate hospital death) at any time if patient's condition changed and it was felt they were too unstable for transfer. [name] is very interested in patient transfer to [facility] as this would be much easier for family to visit.  Visit also consisted of discussions dealing with the complex and emotionally intense issues of symptom management and palliative care in the setting of serious and potentially life-threatening illness. Palliative care team will continue to support patient, patient's family, and medical team.  Discussed with family the importance of continued conversation with each other and the medical providers regarding overall plan of care and treatment options, ensuring decisions are within the context of the patient's values and GOCs.    Questions and concerns were addressed. The patient/family was encouraged to call with questions and/or concerns. PMT number was provided.   Primary Decision Maker: HCPOA - niece/Estelle Pollard    SUMMARY OF RECOMMENDATIONS   Initiated full comfort measures Continue DNR/DNI as previously documented - durable DNR is at bedside HCPOA/Niece/Estelle requests [facility] transfer - TOC consult placed; TOC and hospice liaison notified Added orders for EOL symptom management and to reflect full comfort measures, as well as discontinued orders that were not focused on comfort Unrestricted visitation orders were placed per current [facility] EOL visitation policy  Nursing to provide frequent assessments and administer PRN medications  as clinically necessary to ensure EOL comfort PMT will continue to follow and support holistically  Symptom Management Continue continuous morphine infusion with bolus doses for breakthrough symptoms Tylenol PRN pain/fever Biotin twice daily Benadryl PRN itching Robinul PRN secretions Haldol PRN agitation/delirium Ativan PRN anxiety/seizure/sleep/distress Zofran PRN nausea/vomiting Liquifilm Tears PRN dry eye   Code Status/Advance Care Planning: DNR  Palliative Prophylaxis:  Aspiration, Bowel Regimen, Delirium Protocol, Frequent Pain Assessment, Oral Care, and Turn Reposition  Additional Recommendations (Limitations, Scope, Preferences): Full Comfort Care  Psycho-social/Spiritual:  Desire for further Chaplaincy support:no Created space and opportunity for patient and family to express thoughts and feelings regarding patient's current medical situation.  Emotional support and therapeutic listening provided.  Prognosis:  < 2 weeks  Discharge Planning: Hospice facility      Primary Diagnoses: Present on Admission:  Complicated UTI (urinary tract infection)   I have reviewed the medical record, interviewed the patient and family, and examined the patient. The following aspects are pertinent.  Past Medical History:  Diagnosis Date   Atrial fibrillation ([place])    Cholelithiasis    Constipation    Diabetes mellitus    Dizziness    with some gait disability   GERD (gastroesophageal reflux disease)    Hepatic cyst    Hyperlipidemia    Hypertension    Ischemic cardiomyopathy    LBBB (left bundle branch block)    Macular degeneration    Other postprocedural status(V45.89)    Peripheral edema    Social History   Socioeconomic History   Marital status: Widowed    Spouse name: Not on file   Number of children: Not on file   Years of education: Not on file   Highest education  level: Not on file  Occupational History   Not on file  Tobacco Use   Smoking status:  Never   Smokeless tobacco: Never   Tobacco comments:    occupational exposure:  lint exposure from [place] Use   Vaping Use: Never used  Substance and Sexual Activity   Alcohol use: No   Drug use: No   Sexual activity: Not on file  Other Topics Concern   Not on file  Social History Narrative   Lives alone, uses a walker.  Does not drive             Social Determinants of Radio broadcast assistant Strain: Not on file  Food Insecurity: Not on file  Transportation Needs: Not on file  Physical Activity: Not on file  Stress: Not on file  Social Connections: Not on file   Family History  Problem Relation Age of Onset   Leukemia Mother    Arthritis Father    Heart failure Brother        died age 46   Arthritis Brother    Scheduled Meds: Continuous Infusions:  morphine 5 mg/hr (06/06/22 0841)   PRN Meds:.acetaminophen **OR** acetaminophen, antiseptic oral rinse, diphenhydrAMINE, glycopyrrolate **OR** glycopyrrolate **OR** glycopyrrolate, haloperidol **OR** haloperidol **OR** haloperidol lactate, LORazepam **OR** LORazepam **OR** LORazepam, morphine, ondansetron (ZOFRAN) IV, ondansetron **OR** ondansetron (ZOFRAN) IV, polyvinyl alcohol Medications Prior to Admission:  Prior to Admission medications   Medication Sig Start Date End Date Taking? Authorizing Provider  acetaminophen (TYLENOL) 500 MG tablet Take 500 mg by mouth daily as needed for moderate pain.   Yes [provider]  albuterol (VENTOLIN HFA) 108 (90 Base) MCG/ACT inhaler Inhale 2 puffs into the lungs every 4 (four) hours as needed for wheezing or shortness of breath.   Yes [provider]  ALPRAZolam Duanne Moron) 1 MG tablet Take 1 tablet (1 mg total) by mouth 3 (three) times daily as needed for anxiety. Patient taking differently: Take 1 mg by mouth daily. 04/23/22  Yes Pahwani, Einar Grad, MD  Alum & Mag Hydroxide-Simeth (GERI-LANTA PO) Take 30 mLs by mouth in the morning and at bedtime.   Yes  [provider]  aspirin 81 MG chewable tablet Chew 81 mg by mouth daily.   Yes [provider]  benzonatate (TESSALON) 100 MG capsule Take 100 mg by mouth 3 (three) times daily as needed for cough.   Yes [provider]  bisacodyl (DULCOLAX) 5 MG EC tablet Take 10 mg by mouth daily as needed for moderate constipation.   Yes [provider]  buPROPion (WELLBUTRIN XL) 150 MG 24 hr tablet Take 150 mg by mouth daily.   Yes [provider]  carvedilol (COREG) 6.25 MG tablet Take 6.25 mg by mouth 2 (two) times daily. 09/03/17  Yes [provider]  CHLORASEPTIC 1.4 % LIQD Use as directed 2 sprays in the mouth or throat every 4 (four) hours as needed for sore throat. 05/22/22  Yes [provider]  colestipol (COLESTID) 1 g tablet Take 2 tablets (2 g total) by mouth daily. Hold FOR CONSTIPATION 12/11/17  Yes Irene Pap N, DO  docusate sodium (COLACE) 100 MG capsule Take 100 mg by mouth 2 (two) times daily.   Yes [provider]  Emollient (EUCERIN ORIGINAL HEALING) LOTN Apply 1 application. topically daily. Apply to arms,legs and back   Yes [provider]  ferrous sulfate 325 (65 FE) MG tablet Take 1 tablet (325 mg total) by mouth daily  with breakfast. 04/12/21  Yes Nita Sells, MD  guaiFENesin (MUCINEX) 600 MG 12 hr tablet Take 600 mg by mouth 2 (two) times daily as needed for cough (congestion).   Yes [provider]  guaifenesin (ROBITUSSIN) 100 MG/5ML syrup Take 100 mg by mouth every 8 (eight) hours as needed for cough.   Yes [provider]  HYDROcodone-acetaminophen (NORCO/VICODIN) 5-325 MG tablet Take 1 tablet by mouth every 12 (twelve) hours as needed for moderate pain. 04/23/22  Yes Darliss Cheney, MD  hydrOXYzine (ATARAX) 10 MG tablet Take 10 mg by mouth 3 (three) times daily as needed for itching.   Yes [provider]  Hypertonic Nasal Wash (SINUS RINSE KIT) PACK Place 1 each into the  nose every hour as needed (congestion).   Yes [provider]  Infant Care Products [facility]) OINT Apply 1 application  topically See admin instructions. Apply to buttocks after incontinent episode and as needed. 04/26/22  Yes [provider]  lactobacillus acidophilus & bulgar (LACTINEX) chewable tablet Chew 1 tablet by mouth daily.   Yes [provider]  levothyroxine (SYNTHROID, LEVOTHROID) 25 MCG tablet Take 25 mcg by mouth daily before breakfast.   Yes [provider]  loperamide (IMODIUM A-D) 2 MG tablet Take 2 mg by mouth See admin instructions. Take 2 tablets (80m) by mouth after first loose stool as needed once a day.   Yes [provider]  losartan (COZAAR) 50 MG tablet Take 0.5 tablets (25 mg total) by mouth daily. 04/11/21  Yes SNita Sells MD  magnesium hydroxide (MILK OF MAGNESIA) 400 MG/5ML suspension Take 15 mLs by mouth daily as needed for mild constipation.   Yes [provider]  Menthol, Topical Analgesic, (BIOFREEZE) 4 % GEL Apply 1 application topically 3 (three) times daily as needed (bilateral hip pain).   Yes [provider]  mirabegron ER (MYRBETRIQ) 25 MG TB24 tablet Take 25 mg by mouth daily.   Yes [provider]  mirtazapine (REMERON) 30 MG tablet Take 1 tablet (30 mg total) by mouth at bedtime. 04/11/21  Yes SNita Sells MD  Multiple Vitamins-Minerals (PRESERVISION AREDS PO) Take 1 capsule by mouth daily.   Yes [provider]  mupirocin ointment (BACTROBAN) 2 % Apply 1 application. topically daily.   Yes [provider]  nitrofurantoin, macrocrystal-monohydrate, (MACROBID) 100 MG capsule Take 100 mg by mouth 2 (two) times daily. 06/01/22 06/06/22 Yes [provider]  ondansetron (ZOFRAN) 4 MG tablet Take 4 mg by mouth daily as needed for nausea or vomiting.   Yes [provider]  pantoprazole (PROTONIX) 40 MG tablet Take 1 tablet (40 mg total) by  mouth 2 (two) times daily. 04/11/21  Yes SNita Sells MD  polyethylene glycol powder (GLYCOLAX/MIRALAX) 17 GM/SCOOP powder Take 17 g by mouth daily.   Yes [provider]  Powders (GOLD BOND BABY POWDER EX) Apply 1 application. topically 2 (two) times daily as needed (itching).   Yes [provider]  Pramoxine-Camphor-Zinc Acetate (ANTI ITCH EX) Apply 1 application topically 3 (three) times daily as needed (itching).   Yes [provider]  senna (SENOKOT) 8.6 MG TABS tablet Take 2 tablets by mouth at bedtime. 05/22/22  Yes [provider]  sertraline (ZOLOFT) 100 MG tablet Take 1 tablet (100 mg total) by mouth daily. 04/11/21  Yes SNita Sells MD  simvastatin (ZOCOR) 40 MG tablet Take 40 mg by mouth at bedtime.   Yes [provider]  Sodium Phosphates (ENEMA) 7-19 GM/118ML ENEM  Place 1 Application rectally daily as needed (constipation). 05/08/22  Yes [provider]  spironolactone (ALDACTONE) 25 MG tablet Take 0.5 tablets (12.5 mg total) by mouth daily. 02/21/17  Yes Josue Hector, MD  tamsulosin (FLOMAX) 0.4 MG CAPS capsule Take 0.4 mg by mouth daily. 05/27/22  Yes [provider]  torsemide (DEMADEX) 10 MG tablet Take 1 tablet (10 mg total) by mouth daily. 04/12/21  Yes Nita Sells, MD  torsemide (DEMADEX) 20 MG tablet Take 20 mg by mouth daily as needed (leg swelling).   Yes [provider]  triamcinolone cream (KENALOG) 0.1 % 1 application 2 (two) times daily as needed (itching). perineum   Yes [provider]  Vitamin D, Ergocalciferol, 50 MCG (2000 UT) CAPS Take 2,000 Units by mouth every 30 (thirty) days. 05/29/22  Yes [provider]  Zinc Oxide (DESITIN) 13 % CREA Apply 1 application topically See admin instructions. Cleanse groin area under breast with soap and water, pat dry and apply ointment on area as needed for redness.   Yes [provider]   Allergies  Allergen  Reactions   Fosamax [Alendronate Sodium] Swelling   Penicillins Swelling and Other (See Comments)    Swelling, redness and warmth at injection site Has patient had a PCN reaction causing immediate rash, facial/tongue/throat swelling, SOB or lightheadedness with hypotension: No Has patient had a PCN reaction causing severe rash involving mucus membranes or skin necrosis: No Has patient had a PCN reaction that required hospitalization: No Has patient had a PCN reaction occurring within the last 10 years: No If all of the above answers are "NO", then may proceed with Cephalosporin use.   Sulfonamide Derivatives Swelling   Tetanus Toxoid Swelling   Review of Systems  Unable to perform ROS: Acuity of condition    Physical Exam Vitals and nursing note reviewed.  Constitutional:      General: She is not in acute distress.    Appearance: She is ill-appearing.  Pulmonary:     Effort: No respiratory distress.  Skin:    General: Skin is warm and dry.  Neurological:     Mental Status: She is lethargic.     Motor: Weakness present.     Vital Signs: BP (!) 95/50   Pulse (!) 124   Temp (!) 102.9 F (39.4 C) (Oral)   Resp (!) 22   SpO2 91%  Pain Scale: PAINAD   Pain Score: 2    SpO2: SpO2: 91 % O2 Device:SpO2: 91 % O2 Flow Rate: .O2 Flow Rate (L/min): 3 L/min  IO: Intake/output summary:  Intake/Output Summary (Last 24 hours) at 06/06/2022 1110 Last data filed at 06/06/2022 0423 Gross per 24 hour  Intake 1047.93 ml  Output 300 ml  Net 747.93 ml    LBM:   Baseline Weight:   Most recent weight:       Palliative Assessment/Data: PPS 10%     Time In: 1050 Time Out: 1120 Time Total: 90 minutes  Greater than 50%  of this time was spent counseling and coordinating care related to the above assessment and plan.  Signed by: Lin Landsman, NP   Please contact Palliative Medicine Team phone at [phone] for questions and concerns.  For individual provider: See  Amion  *Portions of this note are a verbal dictation therefore any spelling and/or grammatical errors are due to the "[place] [employer]" system interpretation.

## 2022-06-06 NOTE — ED Notes (Addendum)
Next morphine bag requested from pharmacy, pending RN transport

## 2022-06-06 NOTE — Progress Notes (Signed)
PROGRESS NOTE                                                                                                                                                                                                             Patient Demographics:    [name], is a 88 y.o. female, DOB - [birth year], TMH:[id number]  Outpatient Primary MD for the patient is [name], MD    LOS - 1  Admit date - 06/08/2022    Chief Complaint  Patient presents with   Chest Pain   Shortness of Breath       Brief Narrative (HPI from H&P)   88 y.o. female with medical history significant of afib; DM; stage 3 CKD; chronic systolic CHF; HTN; and HLD presenting with CP/SOB.  She was last admitted from 5/30-6/8 for acute renal failure on stage 3 CKD and UTI vs. Asymptomatic bacteriuria.  The patient reports diarrhea.  She acknowledges urinary symptoms.  She says she just wants to die, she is legally blind with chronic lower extremity weakness and progressive dementia.  Upon admission family expressed wishes to pursue full comfort measures with residential hospice placement if possible.  She was placed on morphine drip and admitted to the hospital for end-of-life care.   Subjective:    Lanaysia Fritchman today in bed denies any headache, no shortness of breath, currently sleepy on morphine drip.  Appears to be in no distress.   Assessment  & Plan :     88 y.o. female with medical history significant of afib; DM; stage 3 CKD; chronic systolic CHF; HTN; and HLD presenting with CP/SOB.  She was last admitted from 5/30-6/8 for acute renal failure on stage 3 CKD and UTI vs. Asymptomatic bacteriuria.  The patient reports diarrhea.  She acknowledges urinary symptoms.  She says she just wants to die, she is legally blind with chronic lower extremity weakness and progressive dementia.  Upon admission family expressed wishes to pursue full comfort measures  with residential hospice placement if possible.  She was placed on morphine drip and admitted to the hospital for end-of-life care.  We will continue morphine drip along with full comfort measures, palliative care consulted, if survives for the next few days will pursue residential hospice placement.  Currently appears to be in no distress.       Condition -  Extremely Guarded  Family Communication  : None present.  Code Status :  DNR  Consults  :  Pall care  PUD Prophylaxis :     Procedures  :            Disposition Plan  :    Status is: Inpatient   DVT Prophylaxis  :      Lab Results  Component Value Date   PLT 143 (L) 05/20/2022    Diet :  Diet Order             DIET SOFT Room service appropriate? Yes; Fluid consistency: Thin  Diet effective now                    Inpatient Medications  Scheduled Meds: Continuous Infusions:  morphine 5 mg/hr (06/06/22 0841)   PRN Meds:.acetaminophen **OR** acetaminophen, antiseptic oral rinse, diphenhydrAMINE, glycopyrrolate **OR** glycopyrrolate **OR** glycopyrrolate, haloperidol **OR** haloperidol **OR** haloperidol lactate, LORazepam **OR** LORazepam **OR** LORazepam, morphine, ondansetron (ZOFRAN) IV, ondansetron **OR** ondansetron (ZOFRAN) IV, polyvinyl alcohol  Antibiotics  :    Anti-infectives (From admission, onward)    Start     Dose/Rate Route Frequency Ordered Stop   06/16/2022 1300  cefTRIAXone (ROCEPHIN) 1 g in sodium chloride 0.9 % 100 mL IVPB        1 g 200 mL/hr over 30 Minutes Intravenous  Once 06/17/2022 1254 05/25/2022 1401        Time Spent in minutes  30   Lala Lund M.D on 06/06/2022 at 10:07 AM  To page go to [email or web address]   [facility] -  Office  [phone]  See all Orders from today for further details    Objective:   Vitals:   06/06/22 0900 06/06/22 0915 06/06/22 0930 06/06/22 0940  BP:    (!) 97/46  Pulse: (!) 125 (!) 124 (!) 125 (!) 124  Resp: (!) 21 (!) 21 (!)  24 (!) 24  Temp:    (!) 102.9 F (39.4 C)  TempSrc:    Oral  SpO2: 91% 93% 94% 94%    Wt Readings from Last 3 Encounters:  04/25/22 91.3 kg  04/15/22 96 kg  03/22/22 96 kg     Intake/Output Summary (Last 24 hours) at 06/06/2022 1007 Last data filed at 06/06/2022 0423 Gross per 24 hour  Intake 1047.93 ml  Output 300 ml  Net 747.93 ml     Physical Exam  Awake but appears sleepy on morphine drip, currently in no distress, moving all 4 extremities, [place].AT,PERRAL Supple Neck, No JVD,   Symmetrical Chest wall movement, Good air movement bilaterally, CTAB RRR,No Gallops,Rubs or new Murmurs,  +ve B.Sounds, Abd Soft, No tenderness,   No Cyanosis, Clubbing or edema     RN pressure injury documentation: Pressure Injury 04/21/22 Buttocks Right Stage 2 -  Partial thickness loss of dermis presenting as a shallow open injury with a red, pink wound bed without slough. (Active)  04/21/22 1449  Location: Buttocks  Location Orientation: Right  Staging: Stage 2 -  Partial thickness loss of dermis presenting as a shallow open injury with a red, pink wound bed without slough.  Wound Description (Comments):   Present on Admission:      Data Review:    CBC Recent Labs  Lab 06/11/2022 0419  WBC 15.5*  HGB 11.3*  HCT 33.5*  PLT 143*  MCV 87.5  MCH 29.5  MCHC 33.7  RDW 13.3  LYMPHSABS 0.5*  MONOABS 0.2  EOSABS 0.7*  BASOSABS  0.0    Electrolytes Recent Labs  Lab June 17, 2022 0419  NA 138  K 2.9*  CL 97*  CO2 27  GLUCOSE 169*  BUN 20  CREATININE 1.28*  CALCIUM 8.6*  AST 21  ALT 14  ALKPHOS 73  BILITOT 0.8  ALBUMIN 2.4*  BNP 72.2    Radiology Reports CT ABDOMEN PELVIS WO CONTRAST  Result Date: 06-17-22 CLINICAL DATA:  Acute non localized abdominal pain. EXAM: CT ABDOMEN AND PELVIS WITHOUT CONTRAST TECHNIQUE: Multidetector CT imaging of the abdomen and pelvis was performed following the standard protocol without IV contrast. RADIATION DOSE REDUCTION: This exam was  performed according to the departmental dose-optimization program which includes automated exposure control, adjustment of the mA and/or kV according to patient size and/or use of iterative reconstruction technique. COMPARISON:  AP chest 06-17-2022, CT abdomen and pelvis without contrast 04/17/2022 and 04/06/2021 FINDINGS: Lower chest: Small bilateral pleural effusions are new from 04/17/2022 but unchanged from CT of the chest earlier today. Mild curvilinear peripheral subpleural interlobular septal thickening/scarring is similar to prior. Coronary artery calcifications are noted. Lack of intravenous contrast limits evaluation of the solid abdominal and pelvic organ parenchyma. Hepatobiliary: The liver demonstrates smooth contours. No gross parenchymal abnormality. Status post cholecystectomy. Pancreas: Moderate atrophy and fatty infiltration of the pancreatic head greater than body and tail. A 15 mm pancreatic head fluid attenuation and 15 mm (axial series 2, image 27) and 4 mm (axial image 27) pancreatic tail fluid attenuation cysts are not significantly changed from multiple prior CTs including 04/06/2021 measured in a similar manner. No ductal dilatation is seen. Spleen: Normal in size without focal abnormality. Adrenals/Urinary Tract: Normal bilateral adrenals. Multilevel right kidney fluid attenuation cysts measuring up to 5.4 cm anteriorly and 5.0 cm posteriorly are not significantly changed. There is mild residual contrast within the kidney parenchyma and early contrast excretion into the renal calices bilaterally from contrast given for same day CT chest. No hydronephrosis. Streak artifact from bilateral total hip arthroplasty hardware obscures the urinary bladder. There appears to be a urinary catheter extending into the urinary bladder. Stomach/Bowel: Mild-to-moderate sigmoid and mild descending colon diverticulosis. Within the limitations of the streak artifact within the pelvis, no definite inflammatory  changes are seen to indicate acute diverticulitis. Underdistention of the majority of the colon further limits evaluation of the large bowel the appendix is again not confidently identified. No dilated loops of bowel are seen to indicate bowel obstruction. Vascular/Lymphatic: No abdominal aortic aneurysm. Moderate to high-grade atherosclerotic calcifications. Reproductive: Streak artifact limits evaluation. Other: No ventral abdominal wall hernia. No free air or free fluid is seen within the abdomen or pelvis. Musculoskeletal: Mild multilevel degenerative disc space narrowing and endplate osteophytes throughout the visualized thoracolumbar spine. Status post bilateral total hip arthroplasty with associated streak artifact. IMPRESSION: 1. Small bilateral pleural effusions. 2. Coronary artery calcifications indicating coronary artery disease. 3. Mild-to-moderate sigmoid and mild descending colon diverticulosis. No definite inflammatory changes to indicate acute diverticulitis. 4. Stable cystic lesions within the pancreas measuring up to 15 mm. Aortic Atherosclerosis (ICD10-I70.0). Electronically Signed   By: Neita Garnet M.D.   On: 06-17-22 12:48   CT Angio Chest Pulmonary Embolism (PE) W or WO Contrast  Result Date: 06-17-2022 CLINICAL DATA:  Pulmonary embolism (PE) suspected, high prob EXAM: CT ANGIOGRAPHY CHEST WITH CONTRAST TECHNIQUE: Multidetector CT imaging of the chest was performed using the standard protocol during bolus administration of intravenous contrast. Multiplanar CT image reconstructions and MIPs were obtained to evaluate the vascular anatomy. RADIATION  DOSE REDUCTION: This exam was performed according to the departmental dose-optimization program which includes automated exposure control, adjustment of the mA and/or kV according to patient size and/or use of iterative reconstruction technique. CONTRAST:  26mL OMNIPAQUE IOHEXOL 350 MG/ML SOLN COMPARISON:  2019 FINDINGS: Cardiovascular:  Satisfactory opacification of the pulmonary arteries to the segmental level. No evidence of pulmonary embolism. Normal heart size. No pericardial effusion. Coronary artery calcification. Mediastinum/Nodes: Normal thyroid.  No enlarged nodes. Lungs/Pleura: Trace pleural effusions. Dependent atelectatic changes. Similar to slight increase in size of 5 mm left upper lobe nodule (series 8, image 65). Upper Abdomen: No acute abnormality. Musculoskeletal: No acute osseous abnormality. Review of the MIP images confirms the above findings. IMPRESSION: No acute pulmonary embolism. Trace pleural effusions.  Dependent atelectasis. Electronically Signed   By: Macy Mis M.D.   On: 05/22/2022 08:22   DG Chest Port 1 View  Result Date: 05/24/2022 CLINICAL DATA:  Shortness of breath. EXAM: PORTABLE CHEST 1 VIEW COMPARISON:  04/16/2022. FINDINGS: The heart size and mediastinal contours are within normal limits. There is atherosclerotic calcification of the aorta. Interstitial prominence is noted bilaterally. There are small bilateral pleural effusions with atelectasis at the lung bases. No pneumothorax is seen. IMPRESSION: Small bilateral pleural effusions with atelectasis at the lung bases. Electronically Signed   By: Brett Fairy M.D.   On: 06/06/2022 04:32

## 2022-06-06 NOTE — Progress Notes (Signed)
Civil engineer, contracting [facility]) Hospital Liaison Note  Referral received for patient/family interest in [facility]. Chart under review by [facility] physician.   Hospice eligibility confirmed.   Unfortunately, [facility] is unable to offer a room today. Hospital Liaison will follow up tomorrow or sooner if a room becomes available.    Please do not hesitate to call with questions or concerns. Thank you  Dionicio Stall, [facility] Liaison [phone]

## 2022-06-07 DIAGNOSIS — N390 Urinary tract infection, site not specified: Secondary | ICD-10-CM | POA: Diagnosis not present

## 2022-06-08 LAB — URINE CULTURE: Culture: >=100000 — AB

## 2022-06-18 NOTE — Discharge Summary (Signed)
[facility] Death Note                                                                                                                                                                                               [name], is a 88 y.o. female, DOB - 1923-10-04, [id number]  Admit date - 05/30/2022   Admitting Physician Karmen Bongo, MD  Outpatient Primary MD for the patient is Mayra Neer, MD  LOS - 2  Chief Complaint  Patient presents with   Chest Pain   Shortness of Breath       Notification: Mayra Neer, MD notified of death of Jul 05, 2022   Date and Time of Death -07/05/22 at 9:13 am  Pronounced by - RN  History of present illness:   [name] is 88 y.o. female with medical history significant of afib; DM; stage 3 CKD; chronic systolic CHF; HTN; and HLD presenting with CP/SOB.  She was last admitted from 5/30-6/8 for acute renal failure on stage 3 CKD and UTI vs. Asymptomatic bacteriuria.  The patient reports diarrhea.  She acknowledges urinary symptoms.  She says she just wants to die, she is legally blind with chronic lower extremity weakness and progressive dementia.  Upon admission family expressed wishes to pursue full comfort measures with residential hospice placement if possible.  She was placed on morphine drip and admitted to the hospital for end-of-life care.  She passed away in no discomfort on Jul 05, 2022 at 9:13 AM pronounced dead by RN    Final Diagnoses:  Cause if death - UTI.  Signature  Lala Lund M.D on 07-05-2022 at 10:08 AM  [facility]  Office Phone [phone]  Total clinical and documentation time for today Under 30 minutes   Last Note                                                                        PROGRESS NOTE  Patient Demographics:    [name], is a 88 y.o. female, DOB - 1923/03/30, [id number]  Outpatient Primary MD for the patient is [name], MD    LOS - 2  Admit date - 05/23/2022    Chief Complaint  Patient presents with   Chest Pain   Shortness of Breath       Brief Narrative (HPI from H&P)   88 y.o. female with medical history significant of afib; DM; stage 3 CKD; chronic systolic CHF; HTN; and HLD presenting with CP/SOB.  She was last admitted from 5/30-6/8 for acute renal failure on stage 3 CKD and UTI vs. Asymptomatic bacteriuria.  The patient reports diarrhea.  She acknowledges urinary symptoms.  She says she just wants to die, she is legally blind with chronic lower extremity weakness and progressive dementia.  Upon admission family expressed wishes to pursue full comfort measures with residential hospice placement if possible.  She was placed on morphine drip and admitted to the hospital for end-of-life care.   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  Appears to be in no distress.   Assessment  & Plan :     88 y.o. female with medical history significant of afib; DM; stage 3 CKD; chronic systolic CHF; HTN; and HLD presenting with CP/SOB.  She was last admitted from 5/30-6/8 for acute renal failure on stage 3 CKD and UTI vs. Asymptomatic bacteriuria.  The patient reports diarrhea.  She acknowledges urinary symptoms.  She says she just wants to die, she is legally blind with chronic lower extremity weakness and progressive dementia.  Upon admission family expressed wishes to pursue full comfort measures with residential hospice placement if possible.  She was placed on morphine drip and admitted to the hospital for end-of-life care.  We will continue morphine drip along with full comfort measures, palliative care consulted, if survives for  the next few days will pursue residential hospice placement.  Currently appears to be in no distress.       Condition - Extremely Guarded  Family Communication  : None present.  Code Status :  DNR  Consults  :  Pall care  PUD Prophylaxis :     Procedures  :            Disposition Plan  :    Status is: Inpatient   DVT Prophylaxis  :      Lab Results  Component Value Date   PLT 143 (L) 06/13/2022    Diet :  Diet Order             Diet regular Room service appropriate? Yes; Fluid consistency: Thin  Diet effective now                    Inpatient Medications  Scheduled Meds:  acetaminophen (TYLENOL) oral liquid 160 mg/5 mL  650 mg Oral Once   antiseptic oral rinse  15 mL Topical BID   Continuous Infusions:  morphine 5 mg/hr (Jun 12, 2022 0003)   PRN Meds:.acetaminophen **OR** acetaminophen, diphenhydrAMINE, glycopyrrolate **OR** glycopyrrolate **OR** glycopyrrolate, haloperidol **OR** haloperidol **OR** haloperidol lactate, LORazepam **OR** LORazepam **OR** LORazepam, morphine, ondansetron **OR** ondansetron (ZOFRAN) IV, polyvinyl alcohol  Antibiotics  :    Anti-infectives (From admission, onward)    Start     Dose/Rate Route Frequency Ordered Stop   05/31/2022 1300  cefTRIAXone (ROCEPHIN) 1 g in sodium chloride 0.9 % 100 mL IVPB  1 g 200 mL/hr over 30 Minutes Intravenous  Once 05/28/2022 1254 05/19/2022 1401        Time Spent in minutes  30   Lala Lund M.D on 06-20-22 at 10:08 AM  To page go to [email or web address]   [facility] -  Office  [phone]  See all Orders from today for further details    Objective:   Vitals:   06/06/22 1430 06/06/22 1543 Jun 20, 2022 0421 06-20-2022 0921  BP:  (!) 89/67 (!) 155/112   Pulse: (!) 128 (!) 124 (!) 52   Resp: 17 16 18    Temp: (!) 102.9 F (39.4 C) 99.5 F (37.5 C) (!) 101.4 F (38.6 C)   TempSrc: Oral Oral Oral   SpO2: 97% (!) 78% (!) 82%   Weight:    91.3 kg  Height:    6' (1.829 m)     Wt Readings from Last 3 Encounters:  2022/06/20 91.3 kg  04/25/22 91.3 kg  04/15/22 96 kg     Intake/Output Summary (Last 24 hours) at Jun 20, 2022 1008 Last data filed at 06-20-2022 0003 Gross per 24 hour  Intake 123 ml  Output --  Net 123 ml     Physical Exam  Awake but appears sleepy on morphine drip, currently in no distress, moving all 4 extremities, [place].AT,PERRAL Supple Neck, No JVD,   Symmetrical Chest wall movement, Good air movement bilaterally, CTAB RRR,No Gallops,Rubs or new Murmurs,  +ve B.Sounds, Abd Soft, No tenderness,   No Cyanosis, Clubbing or edema     RN pressure injury documentation: Pressure Injury 04/21/22 Buttocks Right Stage 2 -  Partial thickness loss of dermis presenting as a shallow open injury with a red, pink wound bed without slough. (Active)  04/21/22 1449  Location: Buttocks  Location Orientation: Right  Staging: Stage 2 -  Partial thickness loss of dermis presenting as a shallow open injury with a red, pink wound bed without slough.  Wound Description (Comments):   Present on Admission:      Pressure Injury 06/06/22 Buttocks Deep Tissue Pressure Injury - Purple or maroon localized area of discolored intact skin or blood-filled blister due to damage of underlying soft tissue from pressure and/or shear. (Active)  06/06/22 1613  Location: Buttocks  Location Orientation:   Staging: Deep Tissue Pressure Injury - Purple or maroon localized area of discolored intact skin or blood-filled blister due to damage of underlying soft tissue from pressure and/or shear.  Wound Description (Comments):   Present on Admission:   Dressing Type Foam - Lift dressing to assess site every shift 06/06/22 2300     Data Review:    CBC Recent Labs  Lab 06/04/2022 0419  WBC 15.5*  HGB 11.3*  HCT 33.5*  PLT 143*  MCV 87.5  MCH 29.5  MCHC 33.7  RDW 13.3  LYMPHSABS 0.5*  MONOABS 0.2  EOSABS 0.7*  BASOSABS 0.0    Electrolytes Recent Labs  Lab  05/20/2022 0419  NA 138  K 2.9*  CL 97*  CO2 27  GLUCOSE 169*  BUN 20  CREATININE 1.28*  CALCIUM 8.6*  AST 21  ALT 14  ALKPHOS 73  BILITOT 0.8  ALBUMIN 2.4*  BNP 72.2    Radiology Reports CT ABDOMEN PELVIS WO CONTRAST  Result Date: 06/16/2022 CLINICAL DATA:  Acute non localized abdominal pain. EXAM: CT ABDOMEN AND PELVIS WITHOUT CONTRAST TECHNIQUE: Multidetector CT imaging of the abdomen and pelvis was performed following the standard protocol without IV contrast. RADIATION DOSE REDUCTION: This exam was  performed according to the departmental dose-optimization program which includes automated exposure control, adjustment of the mA and/or kV according to patient size and/or use of iterative reconstruction technique. COMPARISON:  AP chest 05/19/2022, CT abdomen and pelvis without contrast 04/17/2022 and 04/06/2021 FINDINGS: Lower chest: Small bilateral pleural effusions are new from 04/17/2022 but unchanged from CT of the chest earlier today. Mild curvilinear peripheral subpleural interlobular septal thickening/scarring is similar to prior. Coronary artery calcifications are noted. Lack of intravenous contrast limits evaluation of the solid abdominal and pelvic organ parenchyma. Hepatobiliary: The liver demonstrates smooth contours. No gross parenchymal abnormality. Status post cholecystectomy. Pancreas: Moderate atrophy and fatty infiltration of the pancreatic head greater than body and tail. A 15 mm pancreatic head fluid attenuation and 15 mm (axial series 2, image 27) and 4 mm (axial image 27) pancreatic tail fluid attenuation cysts are not significantly changed from multiple prior CTs including 04/06/2021 measured in a similar manner. No ductal dilatation is seen. Spleen: Normal in size without focal abnormality. Adrenals/Urinary Tract: Normal bilateral adrenals. Multilevel right kidney fluid attenuation cysts measuring up to 5.4 cm anteriorly and 5.0 cm posteriorly are not significantly  changed. There is mild residual contrast within the kidney parenchyma and early contrast excretion into the renal calices bilaterally from contrast given for same day CT chest. No hydronephrosis. Streak artifact from bilateral total hip arthroplasty hardware obscures the urinary bladder. There appears to be a urinary catheter extending into the urinary bladder. Stomach/Bowel: Mild-to-moderate sigmoid and mild descending colon diverticulosis. Within the limitations of the streak artifact within the pelvis, no definite inflammatory changes are seen to indicate acute diverticulitis. Underdistention of the majority of the colon further limits evaluation of the large bowel the appendix is again not confidently identified. No dilated loops of bowel are seen to indicate bowel obstruction. Vascular/Lymphatic: No abdominal aortic aneurysm. Moderate to high-grade atherosclerotic calcifications. Reproductive: Streak artifact limits evaluation. Other: No ventral abdominal wall hernia. No free air or free fluid is seen within the abdomen or pelvis. Musculoskeletal: Mild multilevel degenerative disc space narrowing and endplate osteophytes throughout the visualized thoracolumbar spine. Status post bilateral total hip arthroplasty with associated streak artifact. IMPRESSION: 1. Small bilateral pleural effusions. 2. Coronary artery calcifications indicating coronary artery disease. 3. Mild-to-moderate sigmoid and mild descending colon diverticulosis. No definite inflammatory changes to indicate acute diverticulitis. 4. Stable cystic lesions within the pancreas measuring up to 15 mm. Aortic Atherosclerosis (ICD10-I70.0). Electronically Signed   By: Neita Garnet M.D.   On: 05/23/2022 12:48   CT Angio Chest Pulmonary Embolism (PE) W or WO Contrast  Result Date: 06/04/2022 CLINICAL DATA:  Pulmonary embolism (PE) suspected, high prob EXAM: CT ANGIOGRAPHY CHEST WITH CONTRAST TECHNIQUE: Multidetector CT imaging of the chest was  performed using the standard protocol during bolus administration of intravenous contrast. Multiplanar CT image reconstructions and MIPs were obtained to evaluate the vascular anatomy. RADIATION DOSE REDUCTION: This exam was performed according to the departmental dose-optimization program which includes automated exposure control, adjustment of the mA and/or kV according to patient size and/or use of iterative reconstruction technique. CONTRAST:  30mL OMNIPAQUE IOHEXOL 350 MG/ML SOLN COMPARISON:  2019 FINDINGS: Cardiovascular: Satisfactory opacification of the pulmonary arteries to the segmental level. No evidence of pulmonary embolism. Normal heart size. No pericardial effusion. Coronary artery calcification. Mediastinum/Nodes: Normal thyroid.  No enlarged nodes. Lungs/Pleura: Trace pleural effusions. Dependent atelectatic changes. Similar to slight increase in size of 5 mm left upper lobe nodule (series 8, image 65). Upper Abdomen: No acute abnormality.  Musculoskeletal: No acute osseous abnormality. Review of the MIP images confirms the above findings. IMPRESSION: No acute pulmonary embolism. Trace pleural effusions.  Dependent atelectasis. Electronically Signed   By: Macy Mis M.D.   On: 06/16/2022 08:22   DG Chest Port 1 View  Result Date: 06/15/2022 CLINICAL DATA:  Shortness of breath. EXAM: PORTABLE CHEST 1 VIEW COMPARISON:  04/16/2022. FINDINGS: The heart size and mediastinal contours are within normal limits. There is atherosclerotic calcification of the aorta. Interstitial prominence is noted bilaterally. There are small bilateral pleural effusions with atelectasis at the lung bases. No pneumothorax is seen. IMPRESSION: Small bilateral pleural effusions with atelectasis at the lung bases. Electronically Signed   By: Brett Fairy M.D.   On: 05/21/2022 04:32

## 2022-06-18 DEATH — deceased
